# Patient Record
Sex: Male | Born: 1940 | Race: Black or African American | Hispanic: No | Marital: Married | Smoking: Current some day smoker
Health system: Southern US, Community
[De-identification: ages and names within clinical notes are randomized; demographics above are authoritative.]

## PROBLEM LIST (undated history)

## (undated) DIAGNOSIS — I10 Essential (primary) hypertension: Secondary | ICD-10-CM

## (undated) DIAGNOSIS — C801 Malignant (primary) neoplasm, unspecified: Secondary | ICD-10-CM

## (undated) DIAGNOSIS — J45909 Unspecified asthma, uncomplicated: Secondary | ICD-10-CM

## (undated) DIAGNOSIS — C7A019 Malignant carcinoid tumor of the small intestine, unspecified portion: Secondary | ICD-10-CM

## (undated) DIAGNOSIS — K90829 Short bowel syndrome, unspecified: Secondary | ICD-10-CM

## (undated) DIAGNOSIS — N183 Chronic kidney disease, stage 3 unspecified: Secondary | ICD-10-CM

## (undated) DIAGNOSIS — K912 Postsurgical malabsorption, not elsewhere classified: Secondary | ICD-10-CM

## (undated) DIAGNOSIS — K529 Noninfective gastroenteritis and colitis, unspecified: Secondary | ICD-10-CM

## (undated) HISTORY — PX: COLON SURGERY: SHX602

---

## 1997-12-04 ENCOUNTER — Ambulatory Visit (HOSPITAL_COMMUNITY): Admission: RE | Admit: 1997-12-04 | Discharge: 1997-12-04 | Payer: Self-pay | Admitting: Pulmonary Disease

## 1999-03-11 ENCOUNTER — Encounter (INDEPENDENT_AMBULATORY_CARE_PROVIDER_SITE_OTHER): Payer: Self-pay | Admitting: Specialist

## 1999-03-11 ENCOUNTER — Other Ambulatory Visit: Admission: RE | Admit: 1999-03-11 | Discharge: 1999-03-11 | Payer: Self-pay | Admitting: Ophthalmology

## 2000-10-14 ENCOUNTER — Ambulatory Visit (HOSPITAL_COMMUNITY): Admission: RE | Admit: 2000-10-14 | Discharge: 2000-10-14 | Payer: Self-pay | Admitting: Pulmonary Disease

## 2000-10-14 ENCOUNTER — Encounter: Payer: Self-pay | Admitting: Pulmonary Disease

## 2001-01-08 ENCOUNTER — Encounter: Payer: Self-pay | Admitting: Emergency Medicine

## 2001-01-08 ENCOUNTER — Emergency Department (HOSPITAL_COMMUNITY): Admission: EM | Admit: 2001-01-08 | Discharge: 2001-01-08 | Payer: Self-pay | Admitting: Emergency Medicine

## 2003-07-17 ENCOUNTER — Ambulatory Visit (HOSPITAL_COMMUNITY): Admission: RE | Admit: 2003-07-17 | Discharge: 2003-07-17 | Payer: Self-pay | Admitting: Pulmonary Disease

## 2007-02-16 ENCOUNTER — Ambulatory Visit (HOSPITAL_COMMUNITY): Admission: RE | Admit: 2007-02-16 | Discharge: 2007-02-16 | Payer: Self-pay | Admitting: Pulmonary Disease

## 2007-08-21 ENCOUNTER — Emergency Department (HOSPITAL_COMMUNITY): Admission: EM | Admit: 2007-08-21 | Discharge: 2007-08-21 | Payer: Self-pay | Admitting: Emergency Medicine

## 2010-01-24 ENCOUNTER — Ambulatory Visit (HOSPITAL_COMMUNITY): Admission: RE | Admit: 2010-01-24 | Discharge: 2010-01-24 | Payer: Self-pay | Admitting: Pulmonary Disease

## 2010-02-13 ENCOUNTER — Ambulatory Visit (HOSPITAL_COMMUNITY): Admission: RE | Admit: 2010-02-13 | Discharge: 2010-02-13 | Payer: Self-pay | Admitting: General Surgery

## 2010-03-31 ENCOUNTER — Inpatient Hospital Stay (HOSPITAL_COMMUNITY): Admission: RE | Admit: 2010-03-31 | Discharge: 2010-04-04 | Payer: Self-pay | Admitting: General Surgery

## 2010-03-31 ENCOUNTER — Encounter (INDEPENDENT_AMBULATORY_CARE_PROVIDER_SITE_OTHER): Payer: Self-pay | Admitting: General Surgery

## 2010-04-04 ENCOUNTER — Ambulatory Visit: Payer: Self-pay | Admitting: Hematology and Oncology

## 2010-04-10 LAB — CBC WITH DIFFERENTIAL/PLATELET
BASO%: 0.4 % (ref 0.0–2.0)
EOS%: 0.8 % (ref 0.0–7.0)
Eosinophils Absolute: 0.1 10*3/uL (ref 0.0–0.5)
MCHC: 34.8 g/dL (ref 32.0–36.0)
MCV: 91.9 fL (ref 79.3–98.0)
MONO%: 8.8 % (ref 0.0–14.0)
NEUT#: 8 10*3/uL — ABNORMAL HIGH (ref 1.5–6.5)
RBC: 3.57 10*6/uL — ABNORMAL LOW (ref 4.20–5.82)
RDW: 13.2 % (ref 11.0–14.6)

## 2010-04-10 LAB — COMPREHENSIVE METABOLIC PANEL
ALT: 30 U/L (ref 0–53)
AST: 20 U/L (ref 0–37)
Albumin: 3.6 g/dL (ref 3.5–5.2)
Alkaline Phosphatase: 47 U/L (ref 39–117)
Glucose, Bld: 97 mg/dL (ref 70–99)
Potassium: 4 mEq/L (ref 3.5–5.3)
Sodium: 143 mEq/L (ref 135–145)
Total Protein: 6 g/dL (ref 6.0–8.3)

## 2010-04-17 ENCOUNTER — Ambulatory Visit (HOSPITAL_COMMUNITY): Admission: RE | Admit: 2010-04-17 | Discharge: 2010-04-17 | Payer: Self-pay | Admitting: Hematology and Oncology

## 2010-04-23 ENCOUNTER — Encounter (HOSPITAL_COMMUNITY)
Admission: RE | Admit: 2010-04-23 | Discharge: 2010-06-27 | Payer: Self-pay | Source: Home / Self Care | Attending: Hematology and Oncology | Admitting: Hematology and Oncology

## 2010-05-06 ENCOUNTER — Encounter: Admission: RE | Admit: 2010-05-06 | Discharge: 2010-05-06 | Payer: Self-pay | Admitting: Hematology and Oncology

## 2010-05-14 ENCOUNTER — Ambulatory Visit: Payer: Self-pay | Admitting: Hematology and Oncology

## 2010-06-27 ENCOUNTER — Ambulatory Visit: Payer: Self-pay | Admitting: Hematology and Oncology

## 2010-07-04 ENCOUNTER — Ambulatory Visit (HOSPITAL_COMMUNITY)
Admission: RE | Admit: 2010-07-04 | Discharge: 2010-07-04 | Payer: Self-pay | Source: Home / Self Care | Attending: Hematology and Oncology | Admitting: Hematology and Oncology

## 2010-07-04 LAB — COMPREHENSIVE METABOLIC PANEL
ALT: 19 U/L (ref 0–53)
AST: 18 U/L (ref 0–37)
Albumin: 3.5 g/dL (ref 3.5–5.2)
Alkaline Phosphatase: 50 U/L (ref 39–117)
BUN: 15 mg/dL (ref 6–23)
CO2: 29 mEq/L (ref 19–32)
Calcium: 9.2 mg/dL (ref 8.4–10.5)
Chloride: 102 mEq/L (ref 96–112)
Creatinine, Ser: 1.19 mg/dL (ref 0.40–1.50)
Glucose, Bld: 115 mg/dL — ABNORMAL HIGH (ref 70–99)
Potassium: 2.8 mEq/L — ABNORMAL LOW (ref 3.5–5.3)
Sodium: 140 mEq/L (ref 135–145)
Total Bilirubin: 1.2 mg/dL (ref 0.3–1.2)
Total Protein: 6.7 g/dL (ref 6.0–8.3)

## 2010-07-04 LAB — CBC WITH DIFFERENTIAL/PLATELET
BASO%: 0.1 % (ref 0.0–2.0)
Basophils Absolute: 0 10*3/uL (ref 0.0–0.1)
EOS%: 9.4 % — ABNORMAL HIGH (ref 0.0–7.0)
Eosinophils Absolute: 1.1 10*3/uL — ABNORMAL HIGH (ref 0.0–0.5)
HCT: 38 % — ABNORMAL LOW (ref 38.4–49.9)
HGB: 13.1 g/dL (ref 13.0–17.1)
LYMPH%: 20.7 % (ref 14.0–49.0)
MCH: 31.2 pg (ref 27.2–33.4)
MCHC: 34.4 g/dL (ref 32.0–36.0)
MCV: 90.8 fL (ref 79.3–98.0)
MONO#: 0.7 10*3/uL (ref 0.1–0.9)
MONO%: 5.9 % (ref 0.0–14.0)
NEUT#: 7.6 10*3/uL — ABNORMAL HIGH (ref 1.5–6.5)
NEUT%: 63.9 % (ref 39.0–75.0)
Platelets: 202 10*3/uL (ref 140–400)
RBC: 4.19 10*6/uL — ABNORMAL LOW (ref 4.20–5.82)
RDW: 13.9 % (ref 11.0–14.6)
WBC: 11.8 10*3/uL — ABNORMAL HIGH (ref 4.0–10.3)
lymph#: 2.4 10*3/uL (ref 0.9–3.3)

## 2010-07-04 LAB — LACTATE DEHYDROGENASE: LDH: 124 U/L (ref 94–250)

## 2010-07-08 LAB — 5 HIAA, QUANTITATIVE, URINE, 24 HOUR
5-HIAA, 24 Hr Urine: 1.7 mg/24 h (ref <=6.0)
Volume, Urine-5HIAA: 500 mL/24 h

## 2010-07-11 LAB — BASIC METABOLIC PANEL
BUN: 16 mg/dL (ref 6–23)
CO2: 28 mEq/L (ref 19–32)
Calcium: 9 mg/dL (ref 8.4–10.5)
Chloride: 106 mEq/L (ref 96–112)
Creatinine, Ser: 1.21 mg/dL (ref 0.40–1.50)
Glucose, Bld: 120 mg/dL — ABNORMAL HIGH (ref 70–99)
Potassium: 3.4 mEq/L — ABNORMAL LOW (ref 3.5–5.3)
Sodium: 142 mEq/L (ref 135–145)

## 2010-07-16 ENCOUNTER — Encounter
Admission: RE | Admit: 2010-07-16 | Discharge: 2010-07-16 | Payer: Self-pay | Source: Home / Self Care | Attending: Diagnostic Radiology | Admitting: Diagnostic Radiology

## 2010-08-08 ENCOUNTER — Encounter (HOSPITAL_BASED_OUTPATIENT_CLINIC_OR_DEPARTMENT_OTHER): Payer: Medicare Other | Admitting: Hematology and Oncology

## 2010-08-08 DIAGNOSIS — C787 Secondary malignant neoplasm of liver and intrahepatic bile duct: Secondary | ICD-10-CM

## 2010-08-08 DIAGNOSIS — C7A019 Malignant carcinoid tumor of the small intestine, unspecified portion: Secondary | ICD-10-CM

## 2010-08-25 ENCOUNTER — Ambulatory Visit (INDEPENDENT_AMBULATORY_CARE_PROVIDER_SITE_OTHER): Payer: Medicare Other | Admitting: Psychiatry

## 2010-08-25 DIAGNOSIS — F0630 Mood disorder due to known physiological condition, unspecified: Secondary | ICD-10-CM

## 2010-09-05 ENCOUNTER — Encounter (HOSPITAL_BASED_OUTPATIENT_CLINIC_OR_DEPARTMENT_OTHER): Payer: Medicare Other | Admitting: Hematology and Oncology

## 2010-09-05 DIAGNOSIS — C7A1 Malignant poorly differentiated neuroendocrine tumors: Secondary | ICD-10-CM

## 2010-09-05 DIAGNOSIS — C787 Secondary malignant neoplasm of liver and intrahepatic bile duct: Secondary | ICD-10-CM

## 2010-09-11 LAB — CBC
HCT: 31.8 % — ABNORMAL LOW (ref 39.0–52.0)
HCT: 38.6 % — ABNORMAL LOW (ref 39.0–52.0)
Hemoglobin: 11.5 g/dL — ABNORMAL LOW (ref 13.0–17.0)
Hemoglobin: 13.6 g/dL (ref 13.0–17.0)
Hemoglobin: 15.3 g/dL (ref 13.0–17.0)
MCH: 31 pg (ref 26.0–34.0)
MCH: 31.2 pg (ref 26.0–34.0)
MCH: 32.1 pg (ref 26.0–34.0)
MCHC: 35.2 g/dL (ref 30.0–36.0)
MCHC: 36.2 g/dL — ABNORMAL HIGH (ref 30.0–36.0)
MCHC: 36.4 g/dL — ABNORMAL HIGH (ref 30.0–36.0)
Platelets: 211 10*3/uL (ref 150–400)
RBC: 4.39 MIL/uL (ref 4.22–5.81)
RDW: 12.5 % (ref 11.5–15.5)
RDW: 12.6 % (ref 11.5–15.5)

## 2010-09-11 LAB — BASIC METABOLIC PANEL
BUN: 7 mg/dL (ref 6–23)
CO2: 30 mEq/L (ref 19–32)
CO2: 34 mEq/L — ABNORMAL HIGH (ref 19–32)
Calcium: 8.8 mg/dL (ref 8.4–10.5)
Chloride: 100 mEq/L (ref 96–112)
GFR calc Af Amer: >60 mL/min (ref >60)
GFR calc non Af Amer: >60 mL/min (ref >60)
Glucose, Bld: 110 mg/dL — ABNORMAL HIGH (ref 70–99)
Glucose, Bld: 142 mg/dL — ABNORMAL HIGH (ref 70–99)
Potassium: 3.3 mEq/L — ABNORMAL LOW (ref 3.5–5.1)
Potassium: 3.8 mEq/L (ref 3.5–5.1)
Sodium: 137 mEq/L (ref 135–145)
Sodium: 138 mEq/L (ref 135–145)

## 2010-09-11 LAB — DIFFERENTIAL
Basophils Absolute: 0 10*3/uL (ref 0.0–0.1)
Basophils Absolute: 0 10*3/uL (ref 0.0–0.1)
Basophils Relative: 0 % (ref 0–1)
Basophils Relative: 0 % (ref 0–1)
Eosinophils Absolute: 0 10*3/uL (ref 0.0–0.7)
Eosinophils Absolute: 0.1 10*3/uL (ref 0.0–0.7)
Eosinophils Relative: 1 % (ref 0–5)
Eosinophils Relative: 2 % (ref 0–5)
Lymphocytes Relative: 27 % (ref 12–46)
Lymphs Abs: 1.7 10*3/uL (ref 0.7–4.0)
Lymphs Abs: 2.9 10*3/uL (ref 0.7–4.0)
Monocytes Absolute: 1.6 10*3/uL — ABNORMAL HIGH (ref 0.1–1.0)
Monocytes Absolute: 2.4 10*3/uL — ABNORMAL HIGH (ref 0.1–1.0)
Monocytes Absolute: 0.9 10*3/uL (ref 0.1–1.0)
Monocytes Relative: 12 % (ref 3–12)
Monocytes Relative: 13 % — ABNORMAL HIGH (ref 3–12)
Neutro Abs: 8.9 10*3/uL — ABNORMAL HIGH (ref 1.7–7.7)
Neutro Abs: 6.8 10*3/uL (ref 1.7–7.7)

## 2010-09-11 LAB — COMPREHENSIVE METABOLIC PANEL
AST: 23 U/L (ref 0–37)
Albumin: 3.7 g/dL (ref 3.5–5.2)
BUN: 15 mg/dL (ref 6–23)
Calcium: 10 mg/dL (ref 8.4–10.5)
Chloride: 106 mEq/L (ref 96–112)
Creatinine, Ser: 1.3 mg/dL (ref 0.4–1.5)
GFR calc Af Amer: >60 mL/min (ref >60)
Total Bilirubin: 1 mg/dL (ref 0.3–1.2)
Total Protein: 6.8 g/dL (ref 6.0–8.3)

## 2010-09-11 LAB — TYPE AND SCREEN

## 2010-09-11 LAB — CEA: CEA: 0.8 ng/mL (ref 0.0–5.0)

## 2010-09-11 LAB — ABO/RH: ABO/RH(D): A POS

## 2010-09-23 ENCOUNTER — Other Ambulatory Visit: Payer: Self-pay | Admitting: Gastroenterology

## 2010-10-03 ENCOUNTER — Other Ambulatory Visit: Payer: Self-pay | Admitting: Hematology and Oncology

## 2010-10-03 ENCOUNTER — Encounter (HOSPITAL_BASED_OUTPATIENT_CLINIC_OR_DEPARTMENT_OTHER): Payer: Medicare Other | Admitting: Hematology and Oncology

## 2010-10-03 DIAGNOSIS — C787 Secondary malignant neoplasm of liver and intrahepatic bile duct: Secondary | ICD-10-CM

## 2010-10-03 DIAGNOSIS — R197 Diarrhea, unspecified: Secondary | ICD-10-CM

## 2010-10-03 DIAGNOSIS — R232 Flushing: Secondary | ICD-10-CM

## 2010-10-03 DIAGNOSIS — C7A1 Malignant poorly differentiated neuroendocrine tumors: Secondary | ICD-10-CM

## 2010-10-03 LAB — CBC WITH DIFFERENTIAL/PLATELET
Basophils Absolute: 0 10*3/uL (ref 0.0–0.1)
EOS%: 2.6 % (ref 0.0–7.0)
Eosinophils Absolute: 0.2 10*3/uL (ref 0.0–0.5)
HGB: 12.2 g/dL — ABNORMAL LOW (ref 13.0–17.1)
MONO#: 0.7 10*3/uL (ref 0.1–0.9)
NEUT#: 6.1 10*3/uL (ref 1.5–6.5)
RDW: 13.3 % (ref 11.0–14.6)
lymph#: 2.2 10*3/uL (ref 0.9–3.3)

## 2010-10-06 LAB — 5 HIAA, QUANTITATIVE, URINE, 24 HOUR: Volume, Urine-5HIAA: 1100 mL/24 h

## 2010-10-08 LAB — COMPREHENSIVE METABOLIC PANEL
ALT: 14 U/L (ref 0–53)
CO2: 25 mEq/L (ref 19–32)
Chloride: 107 mEq/L (ref 96–112)
Sodium: 144 mEq/L (ref 135–145)
Total Bilirubin: 1.1 mg/dL (ref 0.3–1.2)
Total Protein: 6.6 g/dL (ref 6.0–8.3)

## 2010-10-08 LAB — LACTATE DEHYDROGENASE: LDH: 146 U/L (ref 94–250)

## 2010-10-15 ENCOUNTER — Other Ambulatory Visit: Payer: Self-pay | Admitting: Diagnostic Radiology

## 2010-10-15 ENCOUNTER — Other Ambulatory Visit: Payer: Self-pay | Admitting: Hematology and Oncology

## 2010-10-15 ENCOUNTER — Encounter (HOSPITAL_BASED_OUTPATIENT_CLINIC_OR_DEPARTMENT_OTHER): Payer: Medicare Other | Admitting: Hematology and Oncology

## 2010-10-15 DIAGNOSIS — C7A8 Other malignant neuroendocrine tumors: Secondary | ICD-10-CM

## 2010-10-15 DIAGNOSIS — D3A8 Other benign neuroendocrine tumors: Secondary | ICD-10-CM

## 2010-10-15 DIAGNOSIS — C787 Secondary malignant neoplasm of liver and intrahepatic bile duct: Secondary | ICD-10-CM

## 2010-10-15 DIAGNOSIS — C7A1 Malignant poorly differentiated neuroendocrine tumors: Secondary | ICD-10-CM

## 2010-10-15 LAB — BASIC METABOLIC PANEL
BUN: 13 mg/dL (ref 6–23)
Glucose, Bld: 146 mg/dL — ABNORMAL HIGH (ref 70–99)
Potassium: 3.3 mEq/L — ABNORMAL LOW (ref 3.5–5.3)

## 2010-10-31 ENCOUNTER — Encounter (HOSPITAL_BASED_OUTPATIENT_CLINIC_OR_DEPARTMENT_OTHER): Payer: Medicare Other | Admitting: Hematology and Oncology

## 2010-10-31 DIAGNOSIS — C7A1 Malignant poorly differentiated neuroendocrine tumors: Secondary | ICD-10-CM

## 2010-10-31 DIAGNOSIS — C787 Secondary malignant neoplasm of liver and intrahepatic bile duct: Secondary | ICD-10-CM

## 2010-11-28 ENCOUNTER — Encounter (HOSPITAL_BASED_OUTPATIENT_CLINIC_OR_DEPARTMENT_OTHER): Payer: Medicare Other | Admitting: Hematology and Oncology

## 2010-11-28 DIAGNOSIS — C787 Secondary malignant neoplasm of liver and intrahepatic bile duct: Secondary | ICD-10-CM

## 2010-11-28 DIAGNOSIS — C7A1 Malignant poorly differentiated neuroendocrine tumors: Secondary | ICD-10-CM

## 2010-12-26 ENCOUNTER — Encounter (HOSPITAL_BASED_OUTPATIENT_CLINIC_OR_DEPARTMENT_OTHER): Payer: Medicare Other | Admitting: Hematology and Oncology

## 2010-12-26 DIAGNOSIS — C7A1 Malignant poorly differentiated neuroendocrine tumors: Secondary | ICD-10-CM

## 2010-12-26 DIAGNOSIS — C787 Secondary malignant neoplasm of liver and intrahepatic bile duct: Secondary | ICD-10-CM

## 2011-01-06 ENCOUNTER — Other Ambulatory Visit: Payer: Self-pay | Admitting: Diagnostic Radiology

## 2011-01-06 DIAGNOSIS — K769 Liver disease, unspecified: Secondary | ICD-10-CM

## 2011-01-16 ENCOUNTER — Ambulatory Visit (HOSPITAL_COMMUNITY)
Admission: RE | Admit: 2011-01-16 | Discharge: 2011-01-16 | Disposition: A | Discharge status: Home / Self Care | Payer: Medicare Other | Source: Ambulatory Visit | Attending: Hematology and Oncology | Admitting: Hematology and Oncology

## 2011-01-16 DIAGNOSIS — D3A8 Other benign neuroendocrine tumors: Secondary | ICD-10-CM

## 2011-01-16 DIAGNOSIS — I10 Essential (primary) hypertension: Secondary | ICD-10-CM | POA: Insufficient documentation

## 2011-01-16 DIAGNOSIS — K7689 Other specified diseases of liver: Secondary | ICD-10-CM | POA: Insufficient documentation

## 2011-01-16 DIAGNOSIS — N281 Cyst of kidney, acquired: Secondary | ICD-10-CM | POA: Insufficient documentation

## 2011-01-16 DIAGNOSIS — C7A00 Malignant carcinoid tumor of unspecified site: Secondary | ICD-10-CM | POA: Insufficient documentation

## 2011-01-16 LAB — CREATININE, SERUM: Creatinine, Ser: 1.04 mg/dL (ref 0.50–1.35)

## 2011-01-16 MED ORDER — GADOBENATE DIMEGLUMINE 529 MG/ML IV SOLN
17.0000 mL | Freq: Once | INTRAVENOUS | Status: AC | PRN
  Administered 2011-01-16: 17 mL via INTRAVENOUS

## 2011-01-21 ENCOUNTER — Ambulatory Visit
Admission: RE | Admit: 2011-01-21 | Discharge: 2011-01-21 | Disposition: A | Discharge status: Home / Self Care | Payer: Medicare Other | Source: Ambulatory Visit | Attending: Diagnostic Radiology | Admitting: Diagnostic Radiology

## 2011-01-21 DIAGNOSIS — C7A8 Other malignant neuroendocrine tumors: Secondary | ICD-10-CM

## 2011-01-21 DIAGNOSIS — C787 Secondary malignant neoplasm of liver and intrahepatic bile duct: Secondary | ICD-10-CM

## 2011-01-21 NOTE — Progress Notes (Signed)
Pt states that he is seeing Dr. Turner Daniels, oncologist at [facility].  Denies nausea or vomiting.  Does experience freq bouts of diarrhea.  Rx per Dr Kendall Flack:  Codeine 15 mg 2 tabs 3-4 x's/day.  Denies pain.  Appetite fair.  Some occasional bloating.    Endurance:  Good.

## 2011-01-23 ENCOUNTER — Other Ambulatory Visit: Payer: Self-pay | Admitting: Hematology and Oncology

## 2011-01-23 ENCOUNTER — Encounter (HOSPITAL_BASED_OUTPATIENT_CLINIC_OR_DEPARTMENT_OTHER): Payer: Medicare Other | Admitting: Hematology and Oncology

## 2011-01-23 DIAGNOSIS — C787 Secondary malignant neoplasm of liver and intrahepatic bile duct: Secondary | ICD-10-CM

## 2011-01-23 DIAGNOSIS — C7A1 Malignant poorly differentiated neuroendocrine tumors: Secondary | ICD-10-CM

## 2011-01-23 LAB — CBC WITH DIFFERENTIAL/PLATELET
BASO%: 0.4 % (ref 0.0–2.0)
Basophils Absolute: 0 10e3/uL (ref 0.0–0.1)
EOS%: 3.8 % (ref 0.0–7.0)
Eosinophils Absolute: 0.3 10e3/uL (ref 0.0–0.5)
HCT: 35.8 % — ABNORMAL LOW (ref 38.4–49.9)
HGB: 12.5 g/dL — ABNORMAL LOW (ref 13.0–17.1)
LYMPH%: 38.5 % (ref 14.0–49.0)
MCH: 32.6 pg (ref 27.2–33.4)
MCHC: 35 g/dL (ref 32.0–36.0)
MCV: 93.2 fL (ref 79.3–98.0)
MONO#: 0.6 10e3/uL (ref 0.1–0.9)
MONO%: 8.7 % (ref 0.0–14.0)
NEUT#: 3.6 10e3/uL (ref 1.5–6.5)
NEUT%: 48.6 % (ref 39.0–75.0)
Platelets: 162 10e3/uL (ref 140–400)
RBC: 3.84 10e6/uL — ABNORMAL LOW (ref 4.20–5.82)
RDW: 13.2 % (ref 11.0–14.6)
WBC: 7.5 10e3/uL (ref 4.0–10.3)
lymph#: 2.9 10e3/uL (ref 0.9–3.3)

## 2011-01-28 ENCOUNTER — Other Ambulatory Visit: Payer: Self-pay | Admitting: Hematology and Oncology

## 2011-01-28 ENCOUNTER — Encounter (HOSPITAL_BASED_OUTPATIENT_CLINIC_OR_DEPARTMENT_OTHER): Payer: Medicare Other | Admitting: Hematology and Oncology

## 2011-01-28 DIAGNOSIS — C7A1 Malignant poorly differentiated neuroendocrine tumors: Secondary | ICD-10-CM

## 2011-01-28 DIAGNOSIS — C787 Secondary malignant neoplasm of liver and intrahepatic bile duct: Secondary | ICD-10-CM

## 2011-01-29 LAB — COMPREHENSIVE METABOLIC PANEL
Alkaline Phosphatase: 48 U/L (ref 39–117)
BUN: 22 mg/dL (ref 6–23)
Creatinine, Ser: 1.28 mg/dL (ref 0.50–1.35)
Glucose, Bld: 109 mg/dL — ABNORMAL HIGH (ref 70–99)
Total Bilirubin: 0.9 mg/dL (ref 0.3–1.2)

## 2011-01-29 LAB — CHROMOGRANIN A: Chromogranin A: 7.6 ng/mL (ref 1.9–15.0)

## 2011-02-04 LAB — 5 HIAA, QUANTITATIVE, URINE, 24 HOUR: 5-HIAA, 24 Hr Urine: 4.1 mg/24 h (ref <=6.0)

## 2011-03-20 LAB — CBC
Platelets: 241
RBC: 4.93
WBC: 13.1 — ABNORMAL HIGH

## 2011-03-20 LAB — URINALYSIS, ROUTINE W REFLEX MICROSCOPIC
Bilirubin Urine: NEGATIVE
Glucose, UA: NEGATIVE
Ketones, ur: NEGATIVE
Nitrite: NEGATIVE
Specific Gravity, Urine: 1.023
pH: 6.5

## 2011-03-20 LAB — COMPREHENSIVE METABOLIC PANEL
ALT: 30
AST: 22
Albumin: 3.7
CO2: 31
Calcium: 9.4
Chloride: 105
GFR calc Af Amer: >60
GFR calc non Af Amer: 56 — ABNORMAL LOW
Sodium: 142

## 2011-03-20 LAB — DIFFERENTIAL
Eosinophils Absolute: 0
Eosinophils Relative: 0
Lymphs Abs: 1.4
Monocytes Absolute: 0.2

## 2011-03-20 LAB — LIPASE, BLOOD: Lipase: 21

## 2011-03-20 LAB — URINE MICROSCOPIC-ADD ON

## 2011-07-08 ENCOUNTER — Ambulatory Visit: Payer: Medicare Other | Admitting: Hematology and Oncology

## 2011-07-08 ENCOUNTER — Other Ambulatory Visit: Payer: Medicare Other | Admitting: Lab

## 2012-08-23 ENCOUNTER — Emergency Department (HOSPITAL_COMMUNITY)
Admission: EM | Admit: 2012-08-23 | Discharge: 2012-08-24 | Disposition: A | Discharge status: Home / Self Care | Payer: Medicare Other | Attending: Emergency Medicine | Admitting: Emergency Medicine

## 2012-08-23 ENCOUNTER — Emergency Department (HOSPITAL_COMMUNITY)
Admission: EM | Admit: 2012-08-23 | Discharge: 2012-08-23 | Disposition: A | Discharge status: Nursing Home (Includes ICF) | Payer: Medicare Other | Source: Home / Self Care | Attending: Family Medicine | Admitting: Family Medicine

## 2012-08-23 ENCOUNTER — Encounter (HOSPITAL_COMMUNITY): Payer: Self-pay | Admitting: *Deleted

## 2012-08-23 ENCOUNTER — Emergency Department (INDEPENDENT_AMBULATORY_CARE_PROVIDER_SITE_OTHER): Payer: Medicare Other

## 2012-08-23 ENCOUNTER — Emergency Department (HOSPITAL_COMMUNITY): Payer: Medicare Other

## 2012-08-23 ENCOUNTER — Encounter (HOSPITAL_COMMUNITY): Payer: Self-pay | Admitting: Emergency Medicine

## 2012-08-23 DIAGNOSIS — R10A1 Flank pain, right side: Secondary | ICD-10-CM

## 2012-08-23 DIAGNOSIS — R109 Unspecified abdominal pain: Secondary | ICD-10-CM

## 2012-08-23 DIAGNOSIS — F17200 Nicotine dependence, unspecified, uncomplicated: Secondary | ICD-10-CM | POA: Insufficient documentation

## 2012-08-23 DIAGNOSIS — N200 Calculus of kidney: Secondary | ICD-10-CM

## 2012-08-23 DIAGNOSIS — R52 Pain, unspecified: Secondary | ICD-10-CM

## 2012-08-23 DIAGNOSIS — N179 Acute kidney failure, unspecified: Secondary | ICD-10-CM

## 2012-08-23 DIAGNOSIS — Z7982 Long term (current) use of aspirin: Secondary | ICD-10-CM | POA: Insufficient documentation

## 2012-08-23 DIAGNOSIS — N289 Disorder of kidney and ureter, unspecified: Secondary | ICD-10-CM

## 2012-08-23 DIAGNOSIS — Z79899 Other long term (current) drug therapy: Secondary | ICD-10-CM | POA: Insufficient documentation

## 2012-08-23 DIAGNOSIS — Z85038 Personal history of other malignant neoplasm of large intestine: Secondary | ICD-10-CM | POA: Insufficient documentation

## 2012-08-23 HISTORY — DX: Malignant (primary) neoplasm, unspecified: C80.1

## 2012-08-23 LAB — CBC WITH DIFFERENTIAL/PLATELET
Basophils Absolute: 0 10*3/uL (ref 0.0–0.1)
Eosinophils Absolute: 0 10*3/uL (ref 0.0–0.7)
Eosinophils Relative: 0 % (ref 0–5)
Lymphocytes Relative: 17 % (ref 12–46)
Lymphs Abs: 2.5 10*3/uL (ref 0.7–4.0)
MCV: 87.4 fL (ref 78.0–100.0)
Neutrophils Relative %: 74 % (ref 43–77)
Platelets: 180 10*3/uL (ref 150–400)
RBC: 4.76 MIL/uL (ref 4.22–5.81)
RDW: 12.4 % (ref 11.5–15.5)
WBC: 14.3 10*3/uL — ABNORMAL HIGH (ref 4.0–10.5)

## 2012-08-23 LAB — HEPATIC FUNCTION PANEL
AST: 19 U/L (ref 0–37)
Bilirubin, Direct: 0.2 mg/dL (ref 0.0–0.3)
Indirect Bilirubin: 0.6 mg/dL (ref 0.3–0.9)
Total Bilirubin: 0.8 mg/dL (ref 0.3–1.2)

## 2012-08-23 LAB — PROTIME-INR
INR: 1 (ref 0.00–1.49)
Prothrombin Time: 13.1 seconds (ref 11.6–15.2)

## 2012-08-23 LAB — URINALYSIS, ROUTINE W REFLEX MICROSCOPIC
Nitrite: NEGATIVE
Protein, ur: 100 mg/dL — AB
Specific Gravity, Urine: 1.018 (ref 1.005–1.030)
Urobilinogen, UA: 0.2 mg/dL (ref 0.0–1.0)

## 2012-08-23 LAB — POCT I-STAT, CHEM 8
Calcium, Ion: 1.3 mmol/L (ref 1.13–1.30)
Creatinine, Ser: 2 mg/dL — ABNORMAL HIGH (ref 0.50–1.35)
Glucose, Bld: 108 mg/dL — ABNORMAL HIGH (ref 70–99)
HCT: 49 % (ref 39.0–52.0)
Hemoglobin: 16.7 g/dL (ref 13.0–17.0)
Potassium: 3.7 mEq/L (ref 3.5–5.1)
TCO2: 29 mmol/L (ref 0–100)

## 2012-08-23 LAB — URINE MICROSCOPIC-ADD ON

## 2012-08-23 MED ORDER — ONDANSETRON 4 MG PO TBDP
ORAL_TABLET | ORAL | Status: AC
  Filled 2012-08-23: qty 1

## 2012-08-23 MED ORDER — ONDANSETRON HCL 4 MG/2ML IJ SOLN
4.0000 mg | Freq: Once | INTRAMUSCULAR | Status: AC
  Administered 2012-08-23: 4 mg via INTRAVENOUS
  Filled 2012-08-23: qty 2

## 2012-08-23 MED ORDER — ONDANSETRON 4 MG PO TBDP
4.0000 mg | ORAL_TABLET | Freq: Once | ORAL | Status: AC
  Administered 2012-08-23: 4 mg via ORAL

## 2012-08-23 MED ORDER — IOHEXOL 300 MG/ML  SOLN
25.0000 mL | INTRAMUSCULAR | Status: AC
  Administered 2012-08-23: 25 mL via ORAL

## 2012-08-23 MED ORDER — FUROSEMIDE 10 MG/ML IJ SOLN: 100.0000 mg | Freq: Once | INTRAVENOUS | Status: DC

## 2012-08-23 MED ORDER — SODIUM CHLORIDE 0.9 % IV SOLN
INTRAVENOUS | Status: DC
  Administered 2012-08-23: 125 mL/h via INTRAVENOUS

## 2012-08-23 MED ORDER — OXYCODONE-ACETAMINOPHEN 7.5-325 MG PO TABS: 1.0000 | ORAL_TABLET | ORAL | Status: DC | PRN

## 2012-08-23 MED ORDER — HYDROCODONE-ACETAMINOPHEN 5-325 MG PO TABS: 2.0000 | ORAL_TABLET | Freq: Three times a day (TID) | ORAL | Status: DC | PRN

## 2012-08-23 MED ORDER — FENTANYL CITRATE 0.05 MG/ML IJ SOLN
50.0000 ug | Freq: Once | INTRAMUSCULAR | Status: AC
  Administered 2012-08-23: 50 ug via INTRAVENOUS
  Filled 2012-08-23: qty 2

## 2012-08-23 MED ORDER — HYDROCODONE-ACETAMINOPHEN 5-325 MG PO TABS
1.0000 | ORAL_TABLET | Freq: Once | ORAL | Status: AC
  Administered 2012-08-23: 1 via ORAL

## 2012-08-23 MED ORDER — HYDROCODONE-ACETAMINOPHEN 5-325 MG PO TABS
ORAL_TABLET | ORAL | Status: AC
  Filled 2012-08-23: qty 1

## 2012-08-23 MED ORDER — FENTANYL CITRATE 0.05 MG/ML IJ SOLN
50.0000 ug | Freq: Once | INTRAMUSCULAR | Status: AC
  Administered 2012-08-24: 50 ug via INTRAVENOUS
  Filled 2012-08-23: qty 2

## 2012-08-23 MED ORDER — ONDANSETRON 4 MG PO TBDP: 4.0000 mg | ORAL_TABLET | Freq: Three times a day (TID) | ORAL | Status: DC | PRN

## 2012-08-23 NOTE — ED Provider Notes (Addendum)
History     CSN: [id number]  Arrival date & time 08/23/12  1709   First MD Initiated Contact with Patient 08/23/12 2003      Chief Complaint  Patient presents with  . 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 Here complaining of intermittent right flank pain since last evening. Symptoms associated with nausea but denies vomiting. Denies dizziness or headache. No dysuria or hematuria. No fever or chills.  Patient reports that he's been currently followed by an oncologist in [place] and his cancer has been stable (dormant) for years. Denies weight changes in the last month. He normally has loose stools. He reports his last bowel movement was yesterday and it was as usual. Denies increase of diarrhea. No jaundice. Denies chest pain or shortness of breath.  Past Medical History  Diagnosis Date  . Cancer     Past Surgical History  Procedure Laterality Date  . Colon surgery      History reviewed. No pertinent family history.  History  Substance Use Topics  . Smoking status: Current Every Day Smoker -- 0.30 packs/day    Types: Cigarettes  . Smokeless tobacco: Never Used  . Alcohol Use: Yes      Review of Systems All other systems reviewed and are nega Allergies  Penicillins  Home Medications   Current Outpatient Rx  Name  Route  Sig  Dispense  Refill  . aspirin EC 81 MG tablet   Oral   Take 81 mg by mouth daily.         . hydrochlorothiazide (MICROZIDE) 12.5 MG capsule   Oral   Take 12.5 mg by mouth daily.         . Multiple Vitamin (MULTIVITAMIN WITH MINERALS) TABS   Oral   Take 1 tablet by mouth daily.         . nebivolol (BYSTOLIC) 10 MG tablet   Oral   Take 20 mg by mouth daily.         . ondansetron (ZOFRAN-ODT) 4 MG disintegrating tablet   Oral   Take 1 tablet (4 mg total) by mouth every 8 (eight) hours as needed for nausea.   10 tablet  0   . oxyCODONE-acetaminophen (PERCOCET) 7.5-325 MG per tablet   Oral   Take 1 tablet by mouth every 4 (four) hours as needed for pain.   30 tablet   0     BP 144/72  Pulse 44  Temp(Src) 98.5 F (36.9 C) (Oral)  Resp 16  SpO2 94%  Physical Exam  Nursing note and vitals reviewed. Constitutional: He is oriented to person, place, and time. He appears well-developed and well-nourished. No distress.  HENT:  Head: Normocephalic and atraumatic.  Eyes: Pupils are equal, round, and reactive to light.  Neck: Normal range of motion.  Cardiovascular: Normal rate and intact distal pulses.   Pulmonary/Chest: No respiratory distress.  Abdominal: Normal appearance. He exhibits no distension. There is tenderness (Mild periumbilical). There is no rebound and no guarding.  Musculoskeletal: Normal range of motion. He exhibits no tenderness.  Neurological: He is alert and oriented to person, place, and time. No cranial nerve deficit.  Skin: Skin is warm and dry. No rash noted.  Psychiatric: He has a normal mood and affect. His behavior is normal.    ED Course  Procedures (including critical care time)  Labs Reviewed  URINALYSIS, ROUTINE W REFLEX MICROSCOPIC - Abnormal;  Notable for the following:    Hgb urine dipstick MODERATE (*)    Protein, ur 100 (*)    All other components within normal limits  CBC WITH DIFFERENTIAL - Abnormal; Notable for the following:    WBC 14.3 (*)    MCHC 36.5 (*)    Neutro Abs 10.6 (*)    Monocytes Absolute 1.2 (*)    All other components within normal limits  URINE MICROSCOPIC-ADD ON - Abnormal; Notable for the following:    Casts GRANULAR CAST (*)    All other components within normal limits  HEPATIC FUNCTION PANEL  PROTIME-INR  LACTIC ACID, PLASMA   Ct Abdomen Pelvis Wo Contrast  08/23/2012  *RADIOLOGY REPORT*  Clinical Data: 72 year old Alan [name] with abdominal and pelvic pain. History of neuroendocrine tumor and liver metastases.  CT ABDOMEN AND PELVIS  WITHOUT CONTRAST  Technique:  Multidetector CT imaging of the abdomen and pelvis was performed following the standard protocol without intravenous contrast.  Comparison: 04/17/2010 PET CT and 01/24/2010 CT  Findings: A 2 mm right UVJ calculus causes mild right hydroureteronephrosis. Several left renal lesions and cysts are again noted and not significantly changed. A low density lesion within the anterior right liver (image 33) is not appear significantly changed as well.  The gallbladder, adrenal glands, pancreas and spleen are unremarkable.  Please note that parenchymal abnormalities may be missed as intravenous contrast was not administered.  No free fluid, enlarged lymph nodes, biliary dilation or abdominal aortic aneurysm identified.  Evidence of previous surgical changes within the abdomen noted. There is no evidence of bowel obstruction or pneumoperitoneum. The appendix is normal. Prostate enlargement is present. No acute or suspicious bony abnormalities are identified.  IMPRESSION: 2 mm right UVJ calculus causing mild right hydroureteronephrosis.  Relatively stable indeterminate left renal lesions and cysts.   Original Report Authenticated By: Margarette Canada, M.D.    Dg Abd 1 View  08/23/2012  *RADIOLOGY REPORT*  Clinical Data: Abdominal pain and nausea.  History of small bowel neuroendocrine carcinoma.  ABDOMEN - 1 VIEW  Comparison: CT dated 01/24/2010.  Findings: Visualized bowel gas pattern shows some mild gaseous prominence of the colon which may be reflective of a mild ileus pattern.  No overt small bowel obstruction is identified.  Suture line from previous small bowel resection is identified in the left mid abdomen.  Significant degenerative changes are present in the lumbar spine.  No abnormal calcifications are visualized.  IMPRESSION: Mild gaseous prominence of the colon which may reflect some degree of ileus.  No evidence of overt bowel obstruction.   Original Report Authenticated By: Aletta Edouard, M.D.      1. Kidney stone   2. Renal insufficiency       MDM          Dot Lanes, MD 08/23/12 2349   Dot Lanes, MD 08/24/12 1332

## 2012-08-23 NOTE — ED Notes (Signed)
Pt reports abdomen/right flank pain that started last night. 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 States that cancer med does adversely affect gallbladder

## 2012-08-23 NOTE — ED Notes (Signed)
Patient stated he's experiencing spasmatic abdominal pain.  The pain is accompanied by nausea.  Bowl sounds are present. He is a cancer patient.  He states that he's had 12 feet of small intestines removed.

## 2012-08-23 NOTE — ED Notes (Signed)
Pt c/o generalized abd pain and sent from [facility] for further eval for possible ileus; pt noted to be bradycardic per norm recently but unsure of exact rate and no EKG in a year; pt sts increased BUN and CREA from baseline also; pt sts hx of bowel resection from CA

## 2012-08-23 NOTE — ED Provider Notes (Signed)
History     CSN: [id number]  Arrival date & time 08/23/12  1322   First MD Initiated Contact with Patient 08/23/12 1329      Chief Complaint  Patient presents with  . 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 Here complaining of intermittent right flank pain since last evening. Symptoms associated with nausea but denies vomiting. Denies dizziness or headache. No dysuria or hematuria. No fever or chills. Patient reports that he's been currently followed by an oncologist in [place] and his cancer has been stable (dormant)  for years. Denies weight changes in the last month. He normally has loose stools. He reports his last bowel movement was yesterday and it was as usual. Denies increase of diarrhea. No jaundice. Denies chest pain or shortness of breath.   Past Medical History  Diagnosis Date  . Cancer     Past Surgical History  Procedure Laterality Date  . Colon surgery      Family History  Problem Relation Age of Onset  . Family history unknown: Yes    History  Substance Use Topics  . Smoking status: Current Every Day Smoker -- 0.30 packs/day    Types: Cigarettes  . Smokeless tobacco: Never Used  . Alcohol Use: Yes      Review of Systems  Constitutional: Negative for fever, chills and fatigue.  Respiratory: Negative for shortness of breath.   Cardiovascular: Negative for chest pain.  Gastrointestinal: Positive for nausea, abdominal pain and diarrhea. Negative for vomiting and blood in stool.  Genitourinary: Negative for dysuria and hematuria.  Neurological: Negative for dizziness and headaches.  All other systems reviewed and are negative.    Allergies  Penicillins  Home Medications  No current outpatient prescriptions on file.  BP 192/67  Pulse 43   Temp(Src) 97.9 F (36.6 C) (Oral)  SpO2 96%  Physical Exam  Nursing note and vitals reviewed. Constitutional: He is oriented to person, place, and time. He appears well-developed and well-nourished. No distress.  HENT:  Head: Normocephalic and atraumatic.  Mouth/Throat: Oropharynx is clear and moist. No oropharyngeal exudate.  Eyes: Conjunctivae are normal. No scleral icterus.  Neck: No thyromegaly present.  Cardiovascular: Normal rate, regular rhythm and normal heart sounds.   Pulmonary/Chest: Effort normal and breath sounds normal. No respiratory distress. He has no wheezes. He has no rales. He exhibits no tenderness.  Abdominal: Soft. Bowel sounds are normal. He exhibits no distension and no mass. There is tenderness. There is no rebound.  Abdomen is soft but impress voluntary guarding on the right flank and right lower quadrant. Impress palpable tip of the liver.  Neurological: He is alert and oriented to person, place, and time.  Skin: No rash noted. He is not diaphoretic.    ED Course  Procedures (including critical care time)  Labs Reviewed  POCT I-STAT, CHEM 8 - Abnormal; Notable for the following:    BUN 28 (*)    Creatinine, Ser 2.00 (*)    Glucose, Bld 108 (*)    All other components within normal limits   Dg Abd 1 View  08/23/2012  *RADIOLOGY REPORT*  Clinical Data: Abdominal pain and nausea.  History of small bowel neuroendocrine carcinoma.  ABDOMEN - 1 VIEW  Comparison: CT dated 01/24/2010.  Findings: Visualized bowel gas pattern shows some mild gaseous prominence of the colon which may be reflective of a mild ileus pattern.  No overt small bowel obstruction is identified.  Suture line from previous small bowel resection is identified in the left mid abdomen.  Significant degenerative changes are present in the lumbar spine.  No abnormal calcifications are visualized.  IMPRESSION: Mild gaseous prominence of the colon which may reflect some degree of ileus.  No evidence of  overt bowel obstruction.   Original Report Authenticated By: Aletta Edouard, M.D.      1. Acute abdominal pain in right flank   2. Acute renal failure       MDM  72 year old male with history of a intestinal neuroendocrine cancer status post resection and liver metastasis. Here complaining of right flank pain associated with nausea. Denies emesis. Having loose bowel movements last time yesterday. On exam vital signs stable. Abdomen is soft with diffuse tenderness and voluntary guarding in the right flank. No rebound. Impress liver tip palpable. No jaundice. Point-of-care labs showed creatinine 2.0 which is increased from prior creatinine at 1.2 one year ago. Abdominal x-ray: With findings suggestive of ileus but no obvious bowel obstruction. Symptoms not improved with ondansetron 4 mg ODT x1 and Norco 325/5 mg oral x1. Decided to transfer patient to the emergency department for further evaluation and management.         Randa Spike, MD 08/23/12 1654

## 2016-04-08 ENCOUNTER — Other Ambulatory Visit: Payer: Self-pay | Admitting: Nephrology

## 2016-04-08 DIAGNOSIS — N183 Chronic kidney disease, stage 3 unspecified: Secondary | ICD-10-CM

## 2016-04-16 ENCOUNTER — Ambulatory Visit
Admission: RE | Admit: 2016-04-16 | Discharge: 2016-04-16 | Disposition: A | Discharge status: Home / Self Care | Payer: Medicare Other | Source: Ambulatory Visit | Attending: Nephrology | Admitting: Nephrology

## 2016-04-16 DIAGNOSIS — N183 Chronic kidney disease, stage 3 unspecified: Secondary | ICD-10-CM

## 2020-03-20 ENCOUNTER — Other Ambulatory Visit: Payer: Self-pay

## 2020-03-20 ENCOUNTER — Encounter (INDEPENDENT_AMBULATORY_CARE_PROVIDER_SITE_OTHER): Payer: Self-pay | Admitting: Otolaryngology

## 2020-03-20 ENCOUNTER — Ambulatory Visit (INDEPENDENT_AMBULATORY_CARE_PROVIDER_SITE_OTHER): Payer: Medicare Other | Admitting: Otolaryngology

## 2020-03-20 VITALS — Temp 97.7°F

## 2020-03-20 DIAGNOSIS — H6123 Impacted cerumen, bilateral: Secondary | ICD-10-CM

## 2020-03-20 DIAGNOSIS — H903 Sensorineural hearing loss, bilateral: Secondary | ICD-10-CM

## 2020-03-20 NOTE — Progress Notes (Signed)
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  He presents today with his wife.  He has complained of decreased hearing as well as some popping and crackling in his ears almost as if he has water in his ears.  Over the past 6 months his hearing has gradually gotten worse.  Past Medical History:  Diagnosis Date  . Cancer [facility]    Past Surgical History:  Procedure Laterality Date  . COLON SURGERY     Social History   Socioeconomic History  . Marital status: Married    Spouse name: Not on file  . Number of children: Not on file  . Years of education: Not on file  . Highest education level: Not on file  Occupational History  . Not on file  Tobacco Use  . Smoking status: Former Smoker    Packs/day: 0.30    Years: 61.00    Pack years: 18.30    Types: Cigarettes    Start date: 52    Quit date: 03/03/2020    Years since quitting: 0.0  . Smokeless tobacco: Never Used  Substance and Sexual Activity  . Alcohol use: Yes  . Drug use: No  . Sexual activity: Not on file  Other Topics Concern  . Not on file  Social History Narrative  . Not on file   Social Determinants of Health   Financial Resource Strain:   . Difficulty of Paying Living Expenses: Not on file  Food Insecurity:   . Worried About Charity fundraiser in the Last Year: Not on file  . Ran Out of Food in the Last Year: Not on file  Transportation Needs:   . Lack of Transportation (Medical): Not on file  . Lack of Transportation (Non-Medical): Not on file  Physical Activity:   . Days of Exercise per Week: Not on file  . Minutes of Exercise per Session: Not on file  Stress:   . Feeling of Stress : Not on file  Social Connections:   . Frequency of Communication with Friends and Family: Not on file  . Frequency of Social Gatherings with Friends and Family: Not on file  . Attends Religious Services: Not on file  . Active Member of Clubs or Organizations: Not on file  . Attends  Archivist Meetings: Not on file  . Marital Status: Not on file   No family history on file. Allergies  Allergen Reactions  . Penicillins Hives and Shortness Of Breath   Prior to Admission medications   Medication Sig Start Date End Date Taking? Authorizing Provider  aspirin EC 81 MG tablet Take 81 mg by mouth daily.    [provider]  hydrochlorothiazide (MICROZIDE) 12.5 MG capsule Take 12.5 mg by mouth daily.    [provider]  Multiple Vitamin (MULTIVITAMIN WITH MINERALS) TABS Take 1 tablet by mouth daily.    [provider]  nebivolol (BYSTOLIC) 10 MG tablet Take 20 mg by mouth daily.    [provider]  ondansetron (ZOFRAN-ODT) 4 MG disintegrating tablet Take 1 tablet (4 mg total) by mouth every 8 (eight) hours as needed for nausea. 08/23/12   Moreno-Coll, Adlih, MD  oxyCODONE-acetaminophen (PERCOCET) 7.5-325 MG per tablet Take 1 tablet by mouth every 4 (four) hours as needed for pain. 08/23/12   Leonard Schwartz, MD     Positive ROS: Otherwise negative  All other systems have been reviewed and were otherwise negative with the exception  of those mentioned in the HPI and as above.  Physical Exam: Constitutional: Alert, well-appearing, no acute distress Ears: External ears without lesions or tenderness.  He has moderate wax buildup in both ears right side worse than left.  This was cleaned with suction forceps and curettes.  After cleaning the wax from his ears his TMs were otherwise clear. Nasal: External nose without lesions. Clear nasal passages Oral: Lips and gums without lesions. Tongue and palate mucosa without lesions. Posterior oropharynx clear. Neck: No palpable adenopathy or masses Respiratory: Breathing comfortably  Skin: No facial/neck lesions or rash noted.  Audiogram in the office today demonstrated a mild downsloping SNHL in both ears which was symmetric with SRT's of 30 dB bilaterally.  He had type A tympanograms  bilaterally.  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

## 2020-03-22 ENCOUNTER — Encounter (INDEPENDENT_AMBULATORY_CARE_PROVIDER_SITE_OTHER): Payer: Self-pay

## 2020-04-05 ENCOUNTER — Ambulatory Visit: Payer: Medicare Other | Attending: Internal Medicine

## 2020-04-05 DIAGNOSIS — Z23 Encounter for immunization: Secondary | ICD-10-CM

## 2020-04-05 NOTE — Progress Notes (Signed)
   Covid-19 Vaccination Clinic  Name:  Alan [name]    MRN: [id number] DOB: 1940/12/13  04/05/2020  Mr. Alan [name] was observed post Covid-19 immunization for 30 minutes based on pre-vaccination screening without incident. He was provided with Vaccine Information Sheet and instruction to access the V-Safe system.   Mr. Alan [name] was instructed to call 911 with any severe reactions post vaccine: Marland Kitchen Difficulty breathing  . Swelling of face and throat  . A fast heartbeat  . A bad rash all over body  . Dizziness and weakness

## 2020-07-23 ENCOUNTER — Ambulatory Visit (HOSPITAL_COMMUNITY)
Admission: EM | Admit: 2020-07-23 | Discharge: 2020-07-23 | Disposition: A | Discharge status: Home / Self Care | Payer: Medicare Other

## 2020-07-23 ENCOUNTER — Encounter (HOSPITAL_COMMUNITY): Payer: Self-pay | Admitting: Emergency Medicine

## 2020-07-23 ENCOUNTER — Other Ambulatory Visit: Payer: Self-pay

## 2020-07-23 ENCOUNTER — Ambulatory Visit (INDEPENDENT_AMBULATORY_CARE_PROVIDER_SITE_OTHER): Payer: Medicare Other

## 2020-07-23 DIAGNOSIS — R0989 Other specified symptoms and signs involving the circulatory and respiratory systems: Secondary | ICD-10-CM

## 2020-07-23 DIAGNOSIS — R0602 Shortness of breath: Secondary | ICD-10-CM | POA: Diagnosis not present

## 2020-07-23 DIAGNOSIS — R079 Chest pain, unspecified: Secondary | ICD-10-CM | POA: Diagnosis not present

## 2020-07-23 DIAGNOSIS — R059 Cough, unspecified: Secondary | ICD-10-CM

## 2020-07-23 MED ORDER — ALBUTEROL SULFATE HFA 108 (90 BASE) MCG/ACT IN AERS: 2.0000 | INHALATION_SPRAY | RESPIRATORY_TRACT | 0 refills | Status: DC | PRN

## 2020-07-23 MED ORDER — PREDNISONE 10 MG PO TABS: 40.0000 mg | ORAL_TABLET | Freq: Every day | ORAL | 0 refills | Status: AC

## 2020-07-23 NOTE — Discharge Instructions (Addendum)
Take the prednisone and use the albuterol inhaler as directed.    Follow up with your primary care provider tomorrow.

## 2020-07-23 NOTE — ED Provider Notes (Signed)
[place]    CSN: [id number] Arrival date & time: 07/23/20  1534      History   Chief Complaint Chief Complaint  Patient presents with  . Cough  . Shortness of Breath    HPI Alan [name] is a 80 y.o. male.   Patient presents with 2-week history of cough, shortness of breath, congestion.  He denies fever, chills, vomiting, diarrhea, or other symptoms.  He states he had a negative Covid test yesterday.  He was instructed by his PCP to come here for a chest x-ray.  His medical history includes malignant carcinoid tumor of small intestine.  Smoker x 61 years; he states he hasn't smoked in 6 days.  The history is provided by the patient and medical records.    Past Medical History:  Diagnosis Date  . Cancer ([place])     There are no problems to display for this patient.   Past Surgical History:  Procedure Laterality Date  . COLON SURGERY         Home Medications    Prior to Admission medications   Medication Sig Start Date End Date Taking? Authorizing Provider  albuterol (VENTOLIN HFA) 108 (90 Base) MCG/ACT inhaler Inhale 2 puffs into the lungs every 4 (four) hours as needed for shortness of breath. 07/23/20  Yes Sharion Balloon, NP  predniSONE (DELTASONE) 10 MG tablet Take 4 tablets (40 mg total) by mouth daily for 5 days. 07/23/20 07/28/20 Yes Sharion Balloon, NP  aspirin EC 81 MG tablet Take 81 mg by mouth daily.    [provider]  hydrochlorothiazide (MICROZIDE) 12.5 MG capsule Take 12.5 mg by mouth daily.    [provider]  Multiple Vitamin (MULTIVITAMIN WITH MINERALS) TABS Take 1 tablet by mouth daily.    [provider]  nebivolol (BYSTOLIC) 10 MG tablet Take 20 mg by mouth daily.    [provider]  olmesartan-hydrochlorothiazide (BENICAR HCT) 20-12.5 MG tablet Take 1 tablet by mouth daily. 07/08/20   [provider]  ondansetron (ZOFRAN-ODT) 4 MG disintegrating tablet Take 1 tablet (4 mg total) by mouth every 8  (eight) hours as needed for nausea. 08/23/12   Moreno-Coll, Adlih, MD  oxyCODONE-acetaminophen (PERCOCET) 7.5-325 MG per tablet Take 1 tablet by mouth every 4 (four) hours as needed for pain. 08/23/12   Leonard Schwartz, MD  tamsulosin (FLOMAX) 0.4 MG CAPS capsule Take 0.4 mg by mouth daily. 07/08/20   [provider]  Vitamin D, Ergocalciferol, (DRISDOL) 1.25 MG (50000 UNIT) CAPS capsule Take by mouth. 07/08/20   [provider]    Family History History reviewed. No pertinent family history.  Social History Social History   Tobacco Use  . Smoking status: Former Smoker    Packs/day: 0.30    Years: 61.00    Pack years: 18.30    Types: Cigarettes    Start date: 23    Quit date: 03/03/2020    Years since quitting: 0.3  . Smokeless tobacco: Never Used  Substance Use Topics  . Alcohol use: Yes  . Drug use: No     Allergies   Penicillins   Review of Systems Review of Systems  Constitutional: Negative for chills and fever.  HENT: Positive for congestion. Negative for ear pain and sore throat.   Eyes: Negative for pain and visual disturbance.  Respiratory: Positive for cough and shortness of breath.   Cardiovascular: Negative for chest pain and palpitations.  Gastrointestinal: Negative for abdominal pain, diarrhea and  vomiting.  Genitourinary: Negative for dysuria and hematuria.  Musculoskeletal: Negative for arthralgias and back pain.  Skin: Negative for color change and rash.  Neurological: Negative for seizures and syncope.  All other systems reviewed and are negative.    Physical Exam Triage Vital Signs ED Triage Vitals  Enc Vitals Group     BP      Pulse      Resp      Temp      Temp src      SpO2      Weight      Height      Head Circumference      Peak Flow      Pain Score      Pain Loc      Pain Edu?      Excl. in [place]?    No data found.  Updated Vital Signs BP 128/76 (BP Location: Right Arm)   Pulse (!) 105   Temp 98.6 F (37 C)  (Oral)   Resp 18   SpO2 98%   Visual Acuity Right Eye Distance:   Left Eye Distance:   Bilateral Distance:    Right Eye Near:   Left Eye Near:    Bilateral Near:     Physical Exam Vitals and nursing note reviewed.  Constitutional:      General: He is not in acute distress.    Appearance: He is well-developed and well-nourished. He is not ill-appearing.  HENT:     Head: Normocephalic and atraumatic.     Mouth/Throat:     Mouth: Mucous membranes are moist.  Eyes:     Conjunctiva/sclera: Conjunctivae normal.  Cardiovascular:     Rate and Rhythm: Normal rate and regular rhythm.     Heart sounds: No murmur heard.   Pulmonary:     Effort: Pulmonary effort is normal. No respiratory distress.     Breath sounds: Rhonchi present.     Comments: Few scattered rhonchi throughout. No respiratory distress.  Abdominal:     Palpations: Abdomen is soft.     Tenderness: There is no abdominal tenderness.  Musculoskeletal:        General: No edema.     Cervical back: Neck supple.  Skin:    General: Skin is warm and dry.  Neurological:     General: No focal deficit present.     Mental Status: He is alert and oriented to person, place, and time.     Gait: Gait normal.  Psychiatric:        Mood and Affect: Mood and affect and mood normal.        Behavior: Behavior normal.      UC Treatments / Results  Labs (all labs ordered are listed, but only abnormal results are displayed) Labs Reviewed - No data to display  EKG   Radiology DG Chest 2 View  Result Date: 07/23/2020 CLINICAL DATA:  Preop cough congestion EXAM: CHEST - 2 VIEW COMPARISON:  03/28/2010 FINDINGS: Streaky left basilar opacity, likely atelectasis or scar. No focal consolidation or effusion. Normal cardiomediastinal silhouette with aortic atherosclerosis. No pneumothorax IMPRESSION: No active cardiopulmonary disease. Mild streaky left basilar atelectasis or scar. Electronically Signed   By: Donavan Foil M.D.   On:  07/23/2020 16:35    Procedures Procedures (including critical care time)  Medications Ordered in UC Medications - No data to display  Initial Impression / Assessment and Plan / UC Course  I have reviewed the triage vital signs  and the nursing notes.  Pertinent labs & imaging results that were available during my care of the patient were reviewed by me and considered in my medical decision making (see chart for details).   Cough, shortness of breath. Patient is in no respiratory distress; O2 sat 98%. Chest x-ray shows mild left basilar atelectasis or scar. Treating with prednisone and albuterol inhaler. Instructed patient to call his PCP in the morning to schedule an appointment for follow-up.  He agrees to plan of care.   Final Clinical Impressions(s) / UC Diagnoses   Final diagnoses:  Shortness of breath  Cough     Discharge Instructions     Take the prednisone and use the albuterol inhaler as directed.    Follow up with your primary care provider tomorrow.        ED Prescriptions    Medication Sig Dispense Auth. Provider   predniSONE (DELTASONE) 10 MG tablet Take 4 tablets (40 mg total) by mouth daily for 5 days. 20 tablet Sharion Balloon, NP   albuterol (VENTOLIN HFA) 108 (90 Base) MCG/ACT inhaler Inhale 2 puffs into the lungs every 4 (four) hours as needed for shortness of breath. 18 g Sharion Balloon, NP     PDMP not reviewed this encounter.   Sharion Balloon, NP 07/23/20 [phone]

## 2020-07-23 NOTE — ED Triage Notes (Signed)
Pt presents with cough, nasal congestion, and sob xs 2 weeks. States was tested for COVID yesterday for Pre-op. Was sent by PCP for chest x-ray.

## 2021-01-16 ENCOUNTER — Observation Stay (HOSPITAL_BASED_OUTPATIENT_CLINIC_OR_DEPARTMENT_OTHER)
Admission: EM | Admit: 2021-01-16 | Discharge: 2021-01-17 | Disposition: A | Discharge status: Home / Self Care | Payer: Medicare Other | Source: Home / Self Care | Attending: Emergency Medicine | Admitting: Emergency Medicine

## 2021-01-16 ENCOUNTER — Encounter (HOSPITAL_COMMUNITY): Payer: Self-pay | Admitting: *Deleted

## 2021-01-16 ENCOUNTER — Emergency Department (HOSPITAL_COMMUNITY): Payer: Medicare Other

## 2021-01-16 ENCOUNTER — Other Ambulatory Visit: Payer: Self-pay

## 2021-01-16 ENCOUNTER — Inpatient Hospital Stay (HOSPITAL_COMMUNITY): Payer: Medicare Other

## 2021-01-16 DIAGNOSIS — E876 Hypokalemia: Secondary | ICD-10-CM | POA: Insufficient documentation

## 2021-01-16 DIAGNOSIS — Z7982 Long term (current) use of aspirin: Secondary | ICD-10-CM | POA: Insufficient documentation

## 2021-01-16 DIAGNOSIS — W01198A Fall on same level from slipping, tripping and stumbling with subsequent striking against other object, initial encounter: Secondary | ICD-10-CM | POA: Insufficient documentation

## 2021-01-16 DIAGNOSIS — Z87891 Personal history of nicotine dependence: Secondary | ICD-10-CM | POA: Insufficient documentation

## 2021-01-16 DIAGNOSIS — M25552 Pain in left hip: Secondary | ICD-10-CM | POA: Insufficient documentation

## 2021-01-16 DIAGNOSIS — M25561 Pain in right knee: Secondary | ICD-10-CM | POA: Insufficient documentation

## 2021-01-16 DIAGNOSIS — I951 Orthostatic hypotension: Secondary | ICD-10-CM | POA: Diagnosis not present

## 2021-01-16 DIAGNOSIS — Z79899 Other long term (current) drug therapy: Secondary | ICD-10-CM | POA: Insufficient documentation

## 2021-01-16 DIAGNOSIS — Z23 Encounter for immunization: Secondary | ICD-10-CM | POA: Insufficient documentation

## 2021-01-16 DIAGNOSIS — R55 Syncope and collapse: Secondary | ICD-10-CM | POA: Diagnosis not present

## 2021-01-16 DIAGNOSIS — Z85030 Personal history of malignant carcinoid tumor of large intestine: Secondary | ICD-10-CM | POA: Insufficient documentation

## 2021-01-16 DIAGNOSIS — N1831 Chronic kidney disease, stage 3a: Secondary | ICD-10-CM | POA: Insufficient documentation

## 2021-01-16 DIAGNOSIS — R739 Hyperglycemia, unspecified: Secondary | ICD-10-CM | POA: Insufficient documentation

## 2021-01-16 DIAGNOSIS — I129 Hypertensive chronic kidney disease with stage 1 through stage 4 chronic kidney disease, or unspecified chronic kidney disease: Secondary | ICD-10-CM | POA: Insufficient documentation

## 2021-01-16 DIAGNOSIS — I2699 Other pulmonary embolism without acute cor pulmonale: Secondary | ICD-10-CM | POA: Diagnosis present

## 2021-01-16 DIAGNOSIS — D696 Thrombocytopenia, unspecified: Secondary | ICD-10-CM | POA: Insufficient documentation

## 2021-01-16 DIAGNOSIS — Z20822 Contact with and (suspected) exposure to covid-19: Secondary | ICD-10-CM | POA: Insufficient documentation

## 2021-01-16 LAB — URINALYSIS, ROUTINE W REFLEX MICROSCOPIC
Bilirubin Urine: NEGATIVE
Glucose, UA: NEGATIVE mg/dL
Hgb urine dipstick: NEGATIVE
Ketones, ur: NEGATIVE mg/dL
Leukocytes,Ua: NEGATIVE
Nitrite: NEGATIVE
Protein, ur: NEGATIVE mg/dL
Specific Gravity, Urine: 1.024 (ref 1.005–1.030)
pH: 5 (ref 5.0–8.0)

## 2021-01-16 LAB — CBC WITH DIFFERENTIAL/PLATELET
Abs Immature Granulocytes: 0.06 10*3/uL (ref 0.00–0.07)
Basophils Absolute: 0 10*3/uL (ref 0.0–0.1)
Basophils Relative: 0 %
Eosinophils Absolute: 0.1 10*3/uL (ref 0.0–0.5)
Eosinophils Relative: 1 %
HCT: 40.2 % (ref 39.0–52.0)
Hemoglobin: 13.8 g/dL (ref 13.0–17.0)
Immature Granulocytes: 0 %
Lymphocytes Relative: 9 %
Lymphs Abs: 1.3 10*3/uL (ref 0.7–4.0)
MCH: 31.4 pg (ref 26.0–34.0)
MCHC: 34.3 g/dL (ref 30.0–36.0)
MCV: 91.6 fL (ref 80.0–100.0)
Monocytes Absolute: 0.8 10*3/uL (ref 0.1–1.0)
Monocytes Relative: 6 %
Neutro Abs: 11.5 10*3/uL — ABNORMAL HIGH (ref 1.7–7.7)
Neutrophils Relative %: 84 %
Platelets: 142 10*3/uL — ABNORMAL LOW (ref 150–400)
RBC: 4.39 MIL/uL (ref 4.22–5.81)
RDW: 13.1 % (ref 11.5–15.5)
WBC: 13.7 10*3/uL — ABNORMAL HIGH (ref 4.0–10.5)
nRBC: 0 % (ref 0.0–0.2)

## 2021-01-16 LAB — COMPREHENSIVE METABOLIC PANEL
ALT: 18 U/L (ref 0–44)
AST: 19 U/L (ref 15–41)
Albumin: 3.7 g/dL (ref 3.5–5.0)
Alkaline Phosphatase: 44 U/L (ref 38–126)
Anion gap: 11 (ref 5–15)
BUN: 31 mg/dL — ABNORMAL HIGH (ref 8–23)
CO2: 27 mmol/L (ref 22–32)
Calcium: 9.4 mg/dL (ref 8.9–10.3)
Chloride: 105 mmol/L (ref 98–111)
Creatinine, Ser: 1.56 mg/dL — ABNORMAL HIGH (ref 0.61–1.24)
GFR, Estimated: 45 mL/min — ABNORMAL LOW (ref >60)
Glucose, Bld: 148 mg/dL — ABNORMAL HIGH (ref 70–99)
Potassium: 3 mmol/L — ABNORMAL LOW (ref 3.5–5.1)
Sodium: 143 mmol/L (ref 135–145)
Total Bilirubin: 1.1 mg/dL (ref 0.3–1.2)
Total Protein: 6.5 g/dL (ref 6.5–8.1)

## 2021-01-16 LAB — PROTIME-INR
INR: 1 (ref 0.8–1.2)
Prothrombin Time: 13.5 seconds (ref 11.4–15.2)

## 2021-01-16 LAB — RESP PANEL BY RT-PCR (FLU A&B, COVID) ARPGX2
Influenza A by PCR: NEGATIVE
Influenza B by PCR: NEGATIVE
SARS Coronavirus 2 by RT PCR: NEGATIVE

## 2021-01-16 LAB — PHOSPHORUS: Phosphorus: 2.8 mg/dL (ref 2.5–4.6)

## 2021-01-16 LAB — MAGNESIUM: Magnesium: 2.2 mg/dL (ref 1.7–2.4)

## 2021-01-16 LAB — D-DIMER, QUANTITATIVE: D-Dimer, Quant: 12.54 ug/mL-FEU — ABNORMAL HIGH (ref 0.00–0.50)

## 2021-01-16 MED ORDER — ENOXAPARIN SODIUM 80 MG/0.8ML IJ SOSY
80.0000 mg | PREFILLED_SYRINGE | Freq: Once | INTRAMUSCULAR | Status: AC
  Administered 2021-01-16: 80 mg via SUBCUTANEOUS
  Filled 2021-01-16: qty 0.8

## 2021-01-16 MED ORDER — POTASSIUM CHLORIDE CRYS ER 20 MEQ PO TBCR
40.0000 meq | EXTENDED_RELEASE_TABLET | Freq: Two times a day (BID) | ORAL | Status: DC
  Administered 2021-01-16 – 2021-01-17 (×3): 40 meq via ORAL
  Filled 2021-01-16 (×3): qty 2

## 2021-01-16 MED ORDER — SODIUM CHLORIDE 0.9 % IV BOLUS
500.0000 mL | Freq: Once | INTRAVENOUS | Status: AC
  Administered 2021-01-16: 500 mL via INTRAVENOUS

## 2021-01-16 MED ORDER — IOHEXOL 350 MG/ML SOLN
100.0000 mL | Freq: Once | INTRAVENOUS | Status: AC | PRN
  Administered 2021-01-16: 64 mL via INTRAVENOUS

## 2021-01-16 MED ORDER — ENOXAPARIN SODIUM 80 MG/0.8ML IJ SOSY
80.0000 mg | PREFILLED_SYRINGE | Freq: Two times a day (BID) | INTRAMUSCULAR | Status: DC
  Administered 2021-01-17: 80 mg via SUBCUTANEOUS
  Filled 2021-01-16: qty 0.8

## 2021-01-16 MED ORDER — SODIUM CHLORIDE (PF) 0.9 % IJ SOLN
INTRAMUSCULAR | Status: AC
  Filled 2021-01-16: qty 50

## 2021-01-16 MED ORDER — ENOXAPARIN SODIUM 60 MG/0.6ML IJ SOSY
60.0000 mg | PREFILLED_SYRINGE | Freq: Once | INTRAMUSCULAR | Status: AC
  Administered 2021-01-16: 60 mg via SUBCUTANEOUS
  Filled 2021-01-16 (×2): qty 0.6

## 2021-01-16 NOTE — ED Notes (Signed)
PT c/o R knee pain.  MD notified.

## 2021-01-16 NOTE — ED Notes (Signed)
Admitting at bedside 

## 2021-01-16 NOTE — ED Triage Notes (Signed)
Pt here via [facility] from home for weakness, fall and sob.  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  AO x 4.  Initial sats of 81%.  Sats increased to 92% on 3 L.  Given 5 of albuterol en-route with improved sats.  No loc. When pt fell, he hit his bottom against wall.

## 2021-01-16 NOTE — ED Provider Notes (Signed)
Alan [name] Provider Note   CSN: [id number] Arrival date & time: 01/16/21  7858     History Chief Complaint  Patient presents with   Fall   Weakness    [name] is a 80 y.o. male.  80 year old male with prior medical history as detailed below presents for evaluation.  Patient with history of metastatic carcinoid cancer.  Patient reports that he was ambulating from his bed to the bathroom this morning.  He suddenly felt very weak.  He tried to get to a seated position but was unsuccessful.  He did fall.  He struck his head.  He did not pass out.  He complains of pain to the left hip.  He was able to stand after the fall.  He denies recent illness.  He reports that his breathing feels improved now after administration of albuterol en route with EMS.  The history is provided by the patient.  Fall This is a new problem. The current episode started 1 to 2 hours ago. The problem occurs every several days. The problem has not changed since onset.Associated symptoms include shortness of breath. Pertinent negatives include no chest pain. Nothing aggravates the symptoms. Nothing relieves the symptoms.  Weakness Associated symptoms: shortness of breath   Associated symptoms: no chest pain       Past Medical History:  Diagnosis Date   Cancer ([place])     There are no problems to display for this patient.   Past Surgical History:  Procedure Laterality Date   COLON SURGERY         No family history on file.  Social History   Tobacco Use   Smoking status: Former    Packs/day: 0.30    Years: 61.00    Pack years: 18.30    Types: Cigarettes    Start date: 74    Quit date: 03/03/2020    Years since quitting: 0.8   Smokeless tobacco: Never  Substance Use Topics   Alcohol use: Yes   Drug use: No    Home Medications Prior to Admission medications   Medication Sig Start Date End Date Taking? Authorizing Provider  albuterol (VENTOLIN  HFA) 108 (90 Base) MCG/ACT inhaler Inhale 2 puffs into the lungs every 4 (four) hours as needed for shortness of breath. 07/23/20   Sharion Balloon, NP  aspirin EC 81 MG tablet Take 81 mg by mouth daily.    [provider]  hydrochlorothiazide (MICROZIDE) 12.5 MG capsule Take 12.5 mg by mouth daily.    [provider]  Multiple Vitamin (MULTIVITAMIN WITH MINERALS) TABS Take 1 tablet by mouth daily.    [provider]  nebivolol (BYSTOLIC) 10 MG tablet Take 20 mg by mouth daily.    [provider]  olmesartan-hydrochlorothiazide (BENICAR HCT) 20-12.5 MG tablet Take 1 tablet by mouth daily. 07/08/20   [provider]  ondansetron (ZOFRAN-ODT) 4 MG disintegrating tablet Take 1 tablet (4 mg total) by mouth every 8 (eight) hours as needed for nausea. 08/23/12   Moreno-Coll, Adlih, MD  oxyCODONE-acetaminophen (PERCOCET) 7.5-325 MG per tablet Take 1 tablet by mouth every 4 (four) hours as needed for pain. 08/23/12   Leonard Schwartz, MD  tamsulosin (FLOMAX) 0.4 MG CAPS capsule Take 0.4 mg by mouth daily. 07/08/20   [provider]  Vitamin D, Ergocalciferol, (DRISDOL) 1.25 MG (50000 UNIT) CAPS capsule Take by mouth. 07/08/20   [provider]    Allergies    Penicillins  Review  of Systems   Review of Systems  Respiratory:  Positive for shortness of breath.   Cardiovascular:  Negative for chest pain.  Neurological:  Positive for weakness.  All other systems reviewed and are negative.  Physical Exam Updated Vital Signs BP 122/73 (BP Location: Left Arm)   Pulse 75   Temp 97.7 F (36.5 C) (Oral)   Resp 18   Ht 6\' 2"  (1.88 m)   Wt 78.5 kg   SpO2 90%   BMI 22.21 kg/m   Physical Exam Vitals and nursing note reviewed.  Constitutional:      General: He is not in acute distress.    Appearance: Normal appearance. He is well-developed.  HENT:     Head: Normocephalic and atraumatic.  Eyes:     Conjunctiva/sclera: Conjunctivae normal.      Pupils: Pupils are equal, round, and reactive to light.  Cardiovascular:     Rate and Rhythm: Normal rate and regular rhythm.     Heart sounds: Normal heart sounds.  Pulmonary:     Effort: Pulmonary effort is normal. No respiratory distress.     Breath sounds: Normal breath sounds.  Abdominal:     General: There is no distension.     Palpations: Abdomen is soft.     Tenderness: There is no abdominal tenderness.  Musculoskeletal:        General: No deformity. Normal range of motion.     Cervical back: Normal range of motion and neck supple.  Skin:    General: Skin is warm and dry.  Neurological:     General: No focal deficit present.     Mental Status: He is alert and oriented to person, place, and time.    ED Results / Procedures / Treatments   Labs (all labs ordered are listed, but only abnormal results are displayed) Labs Reviewed  URINALYSIS, ROUTINE W REFLEX MICROSCOPIC  COMPREHENSIVE METABOLIC PANEL  CBC WITH DIFFERENTIAL/PLATELET  PROTIME-INR  D-DIMER, QUANTITATIVE    EKG None  Radiology No results found.  Procedures Procedures   Medications Ordered in ED Medications - No data to display  ED Course  I have reviewed the triage vital signs and the nursing notes.  Pertinent labs & imaging results that were available during my care of the patient were reviewed by me and considered in my medical decision making (see chart for details).    MDM Rules/Calculators/A&P                           MDM  MSE complete  Cadence Haslam Rager was evaluated in Emergency Department on 01/16/2021 for the symptoms described in the history of present illness. He was evaluated in the context of the global COVID-19 pandemic, which necessitated consideration that the patient might be at risk for infection with the SARS-CoV-2 virus that causes COVID-19. Institutional protocols and algorithms that pertain to the evaluation of patients at risk for COVID-19 are in a state of rapid change  based on information released by regulatory bodies including the CDC and federal and state organizations. These policies and algorithms were followed during the patient's care in the ED.  Patient presented with complaint of generalized weakness and associated fall.  Patient was noted to be moderately hypoxic in the field with EMS.  Patient with elevated D-dimer on screening labs.  CT angio of the chest reveals evidence of pulmonary emboli.  No significant right heart strain on CT.  Patient would benefit from  admission for further work-up and treatment.  Hospitalist service Marylyn Ishihara) aware of case and will evaluate for same.   Final Clinical Impression(s) / ED Diagnoses Final diagnoses:  Pulmonary embolism, other, unspecified chronicity, unspecified whether acute cor pulmonale present [facility])    Rx / DC Orders ED Discharge Orders     None        Valarie Merino, MD 01/16/21 1310

## 2021-01-16 NOTE — ED Notes (Signed)
PT to CT.

## 2021-01-16 NOTE — H&P (Signed)
History and Physical    Brason Berthelot Pickard NUU:[id number] DOB: [birth year] DOA: 01/16/2021  PCP: Vincente Liberty, MD  Patient coming from: Home  Chief Complaint: weakness  HPI: Alan [name] is a 80 y.o. male with medical history significant of carcinoid tumor, HTN, BPH. Presenting with weakness and near syncopal episode. He reports that he was in his normal state of health until this morning. He got up to go to the bathroom and returned to bed without incidence around 7am. However, the next time he got up, he started walking out of the room and felt weak. He felt "as if the lights were going out." He got down in a crouched position to try to steady himself, but then he fell over. He remembers the entire fall. There was no LOC or head injury. He was able to get up and move himself to his bed. EMS was called. During their evaluation, the patient was noted to be hypoxic. He says he felt a little short of breath but didn't think much of it. He was brought to the ED for further eval.   Of note, he reports that he has had poor PO intake due to his cancer. His appetite is decreased, but he also has uncontrolled diarrhea when he does eat. He admits to an element of diet restriction due to the uncomfortable nature of his diarrhea.   ED Course: CTA chest showed a PE. He was started on anticoagulation. TRH was called for admission.   Review of Systems:  Denies CP, palpitations, N/V, fever. Reports poor appetite and PO intake, right knee pain, chronic diarrhea. Review of systems is otherwise negative for all not mentioned in HPI.   PMHx Past Medical History:  Diagnosis Date   Cancer [facility])     PSHx Past Surgical History:  Procedure Laterality Date   COLON SURGERY      SocHx  reports that he quit smoking about 10 months ago. His smoking use included cigarettes. He started smoking about 62 years ago. He has a 18.30 pack-year smoking history. He has never used smokeless tobacco. He reports current  alcohol use. He reports that he does not use drugs.  Allergies  Allergen Reactions   Penicillins Hives and Shortness Of Breath   Lisinopril Itching    FamHx No family history on file.  Prior to Admission medications   Medication Sig Start Date End Date Taking? Authorizing Provider  albuterol (VENTOLIN HFA) 108 (90 Base) MCG/ACT inhaler Inhale 2 puffs into the lungs every 4 (four) hours as needed for shortness of breath. 07/23/20   Sharion Balloon, NP  aspirin EC 81 MG tablet Take 81 mg by mouth daily.    [provider]  hydrochlorothiazide (MICROZIDE) 12.5 MG capsule Take 12.5 mg by mouth daily.    [provider]  Multiple Vitamin (MULTIVITAMIN WITH MINERALS) TABS Take 1 tablet by mouth daily.    [provider]  nebivolol (BYSTOLIC) 10 MG tablet Take 20 mg by mouth daily.    [provider]  olmesartan-hydrochlorothiazide (BENICAR HCT) 20-12.5 MG tablet Take 1 tablet by mouth daily. 07/08/20   [provider]  ondansetron (ZOFRAN-ODT) 4 MG disintegrating tablet Take 1 tablet (4 mg total) by mouth every 8 (eight) hours as needed for nausea. 08/23/12   Moreno-Coll, Adlih, MD  oxyCODONE-acetaminophen (PERCOCET) 7.5-325 MG per tablet Take 1 tablet by mouth every 4 (four) hours as needed for pain. 08/23/12   Leonard Schwartz, MD  tamsulosin (FLOMAX) 0.4 MG CAPS capsule  Take 0.4 mg by mouth daily. 07/08/20   [provider]  Vitamin D, Ergocalciferol, (DRISDOL) 1.25 MG (50000 UNIT) CAPS capsule Take by mouth. 07/08/20   [provider]    Physical Exam: Vitals:   01/16/21 1130 01/16/21 1154 01/16/21 1230 01/16/21 1300  BP: 140/72 (!) 165/85 (!) 157/96 135/81  Pulse: 67 64 (!) 58 68  Resp: 15 18 (!) 27 (!) 24  Temp:      TempSrc:      SpO2: 98% 98% 96% 96%  Weight:      Height:        General: 80 y.o. male resting in bed in NAD Eyes: PERRL, normal sclera ENMT: Nares patent w/o discharge, orophaynx clear, dentition normal, ears  w/o discharge/lesions/ulcers Neck: Supple, trachea midline Cardiovascular: brady, +S1, S2, no m/g/r, equal pulses throughout Respiratory: CTABL, no w/r/r, normal WOB GI: BS+, NDNT, no masses noted, no organomegaly noted MSK: No e/c/c Skin: No rashes, bruises, ulcerations noted Neuro: A&O x 3, no focal deficits Psyc: Appropriate interaction and affect, calm/cooperative  Labs on Admission: I have personally reviewed following labs and imaging studies  CBC: Recent Labs  Lab 01/16/21 0959  WBC 13.7*  NEUTROABS 11.5*  HGB 13.8  HCT 40.2  MCV 91.6  PLT 762*   Basic Metabolic Panel: Recent Labs  Lab 01/16/21 0959  NA 143  K 3.0*  CL 105  CO2 27  GLUCOSE 148*  BUN 31*  CREATININE 1.56*  CALCIUM 9.4   GFR: Estimated Creatinine Clearance: 42.6 mL/min (A) (by C-G formula based on SCr of 1.56 mg/dL (H)). Liver Function Tests: Recent Labs  Lab 01/16/21 0959  AST 19  ALT 18  ALKPHOS 44  BILITOT 1.1  PROT 6.5  ALBUMIN 3.7   No results for input(s): LIPASE, AMYLASE in the last 168 hours. No results for input(s): AMMONIA in the last 168 hours. Coagulation Profile: Recent Labs  Lab 01/16/21 0959  INR 1.0   Cardiac Enzymes: No results for input(s): CKTOTAL, CKMB, CKMBINDEX, TROPONINI in the last 168 hours. BNP (last 3 results) No results for input(s): PROBNP in the last 8760 hours. HbA1C: No results for input(s): HGBA1C in the last 72 hours. CBG: No results for input(s): GLUCAP in the last 168 hours. Lipid Profile: No results for input(s): CHOL, HDL, LDLCALC, TRIG, CHOLHDL, LDLDIRECT in the last 72 hours. Thyroid Function Tests: No results for input(s): TSH, T4TOTAL, FREET4, T3FREE, THYROIDAB in the last 72 hours. Anemia Panel: No results for input(s): VITAMINB12, FOLATE, FERRITIN, TIBC, IRON, RETICCTPCT in the last 72 hours. Urine analysis:    Component Value Date/Time   COLORURINE YELLOW 08/23/2012 [place] 08/23/2012 2225   LABSPEC 1.018  08/23/2012 2225   PHURINE 5.0 08/23/2012 2225   GLUCOSEU NEGATIVE 08/23/2012 2225   HGBUR MODERATE (A) 08/23/2012 2225   BILIRUBINUR NEGATIVE 08/23/2012 2225   KETONESUR NEGATIVE 08/23/2012 2225   PROTEINUR 100 (A) 08/23/2012 2225   UROBILINOGEN 0.2 08/23/2012 2225   NITRITE NEGATIVE 08/23/2012 2225   LEUKOCYTESUR NEGATIVE 08/23/2012 2225    Radiological Exams on Admission: DG Chest 2 View  Result Date: 01/16/2021 CLINICAL DATA:  Recent fall today with left hip pain, initial encounter EXAM: CHEST - 2 VIEW COMPARISON:  07/23/2020 FINDINGS: Cardiac shadow is stable. Aortic calcifications are noted. The lungs are well aerated bilaterally. No focal infiltrate or effusion is seen. No bony abnormality is noted. Skin fold is noted over the left chest. IMPRESSION: No active cardiopulmonary disease. Electronically Signed  By: Inez Catalina M.D.   On: 01/16/2021 09:26   CT Head Wo Contrast  Result Date: 01/16/2021 CLINICAL DATA:  Minor head trauma.  History of metastatic disease. EXAM: CT HEAD WITHOUT CONTRAST TECHNIQUE: Contiguous axial images were obtained from the base of the skull through the vertex without intravenous contrast. COMPARISON:  None. FINDINGS: Brain: No evidence of acute infarction, hemorrhage, hydrocephalus, extra-axial collection or mass lesion/mass effect. Vascular: No hyperdense vessel or unexpected calcification. Skull: Normal. Negative for fracture or focal lesion. Sinuses/Orbits: No acute finding. IMPRESSION: Negative head CT. Electronically Signed   By: Monte Fantasia M.D.   On: 01/16/2021 09:34   CT Angio Chest PE W and/or Wo Contrast  Result Date: 01/16/2021 CLINICAL DATA:  Weakness and shortness of breath EXAM: CT ANGIOGRAPHY CHEST WITH CONTRAST TECHNIQUE: Multidetector CT imaging of the chest was performed using the standard protocol during bolus administration of intravenous contrast. Multiplanar CT image reconstructions and MIPs were obtained to evaluate the vascular  anatomy. CONTRAST:  34mL OMNIPAQUE IOHEXOL 350 MG/ML SOLN COMPARISON:  04/17/2010 FINDINGS: Cardiovascular: Atherosclerotic calcifications of the aorta are noted. No significant aortic opacification is identified to suggest dissection. No aneurysmal dilatation is noted. No cardiac enlargement is seen. Scattered coronary calcifications are noted. The pulmonary artery shows a normal branching pattern bilaterally. Tiny right middle lobe pulmonary emboli are seen. No other significant emboli are noted. No right heart strain is seen. Mediastinum/Nodes: Thoracic inlet is within normal limits. No sizable hilar or mediastinal adenopathy is noted. The esophagus demonstrates some retained fluid and food stuffs. This may be related to reflux. Lungs/Pleura: Lungs are well aerated bilaterally. Minimal dependent atelectatic changes are seen. No focal infiltrate or effusion is noted. No evidence of metastatic disease is seen. Upper Abdomen: Visualized upper abdomen shows renal cystic change on the left as well as cysts within the left lobe of the liver. No other focal abnormality in the upper abdomen is seen. Musculoskeletal: Degenerative changes of the thoracic spine are Review of the MIP images confirms the above findings. IMPRESSION: Small pulmonary emboli involving the right middle lobe as described. 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 Aortic Atherosclerosis (ICD10-I70.0). Electronically Signed   By: Inez Catalina M.D.   On: 01/16/2021 12:35   DG Hip Unilat W or Wo Pelvis 2-3 Views Left  Result Date: 01/16/2021 CLINICAL DATA:  Recent fall with left hip pain, initial encounter EXAM: DG HIP (WITH OR WITHOUT PELVIS) 2-3V LEFT COMPARISON:  None. FINDINGS: Pelvic ring is intact. Mild degenerative changes of left hip joint are noted. No acute fracture or dislocation is seen. No soft tissue abnormality is noted. Degenerative changes in the lower lumbar spine are noted. IMPRESSION: Degenerative change without acute  abnormality. Electronically Signed   By: Inez Catalina M.D.   On: 01/16/2021 09:27    EKG: Independently reviewed. 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     - we will complete a syncopal work up; will have PT/OT eval, rpt EKG  Hx of malignant carcinoid tumor     - continue outpt follow up with [facility] onco  Hypokalemia     - replace K+; check Mg2+  CKD 3a     - at baseline Scr, watch nephtoxins, follow  Thrombocytopenia     - mild, no evidence of bleed, follow  Hyperglycemia     - check A1c, follow   Right knee pain after fall     - check r knee film     - PRN norco  HTN     - continue home regimen when confirmed w/ the exception of the beta blocker for now  DVT prophylaxis: lovenox  Code Status: FULL  Family Communication: w/ wife at bedside  Consults called: None   Status is: Inpatient  Remains inpatient appropriate because:Inpatient level of care appropriate due to severity of illness  Dispo: The patient is from: Home              Anticipated d/c is to: Home              Patient currently is not medically stable to d/c.   Difficult to place patient No  Time spent coordinating admission: 70 minutes  [facility]  If 7PM-7AM, please contact night-coverage [email or web address]  01/16/2021, 1:49 PM

## 2021-01-17 ENCOUNTER — Inpatient Hospital Stay (HOSPITAL_BASED_OUTPATIENT_CLINIC_OR_DEPARTMENT_OTHER): Payer: Medicare Other

## 2021-01-17 DIAGNOSIS — R55 Syncope and collapse: Secondary | ICD-10-CM | POA: Diagnosis not present

## 2021-01-17 DIAGNOSIS — I2699 Other pulmonary embolism without acute cor pulmonale: Secondary | ICD-10-CM

## 2021-01-17 LAB — COMPREHENSIVE METABOLIC PANEL
ALT: 18 U/L (ref 0–44)
AST: 23 U/L (ref 15–41)
Albumin: 3.7 g/dL (ref 3.5–5.0)
Alkaline Phosphatase: 45 U/L (ref 38–126)
Anion gap: 6 (ref 5–15)
BUN: 26 mg/dL — ABNORMAL HIGH (ref 8–23)
CO2: 29 mmol/L (ref 22–32)
Calcium: 9.1 mg/dL (ref 8.9–10.3)
Chloride: 107 mmol/L (ref 98–111)
Creatinine, Ser: 1.29 mg/dL — ABNORMAL HIGH (ref 0.61–1.24)
GFR, Estimated: 56 mL/min — ABNORMAL LOW (ref >60)
Glucose, Bld: 125 mg/dL — ABNORMAL HIGH (ref 70–99)
Potassium: 3.5 mmol/L (ref 3.5–5.1)
Sodium: 142 mmol/L (ref 135–145)
Total Bilirubin: 1.8 mg/dL — ABNORMAL HIGH (ref 0.3–1.2)
Total Protein: 6.5 g/dL (ref 6.5–8.1)

## 2021-01-17 LAB — CBC
HCT: 35.8 % — ABNORMAL LOW (ref 39.0–52.0)
Hemoglobin: 12.7 g/dL — ABNORMAL LOW (ref 13.0–17.0)
MCH: 32 pg (ref 26.0–34.0)
MCHC: 35.5 g/dL (ref 30.0–36.0)
MCV: 90.2 fL (ref 80.0–100.0)
Platelets: 133 10*3/uL — ABNORMAL LOW (ref 150–400)
RBC: 3.97 MIL/uL — ABNORMAL LOW (ref 4.22–5.81)
RDW: 13 % (ref 11.5–15.5)
WBC: 10.2 10*3/uL (ref 4.0–10.5)
nRBC: 0 % (ref 0.0–0.2)

## 2021-01-17 LAB — HEMOGLOBIN A1C
Hgb A1c MFr Bld: 5.1 % (ref 4.8–5.6)
Mean Plasma Glucose: 99.67 mg/dL

## 2021-01-17 LAB — ECHOCARDIOGRAM COMPLETE
Area-P 1/2: 1.77 cm2
Height: 74.016 in
S' Lateral: 2.6 cm
Weight: 2387.2 oz

## 2021-01-17 MED ORDER — APIXABAN (ELIQUIS) VTE STARTER PACK (10MG AND 5MG): ORAL_TABLET | ORAL | 0 refills | Status: DC

## 2021-01-17 MED ORDER — HYDROCODONE-ACETAMINOPHEN 5-325 MG PO TABS
1.0000 | ORAL_TABLET | ORAL | Status: DC | PRN
  Administered 2021-01-17: 1 via ORAL
  Filled 2021-01-17: qty 1

## 2021-01-17 MED ORDER — SODIUM CHLORIDE 0.9% FLUSH
3.0000 mL | Freq: Two times a day (BID) | INTRAVENOUS | Status: DC
  Administered 2021-01-17: 3 mL via INTRAVENOUS

## 2021-01-17 MED ORDER — ONDANSETRON HCL 4 MG PO TABS: 4.0000 mg | ORAL_TABLET | Freq: Four times a day (QID) | ORAL | Status: DC | PRN

## 2021-01-17 MED ORDER — ONDANSETRON HCL 4 MG/2ML IJ SOLN: 4.0000 mg | Freq: Four times a day (QID) | INTRAMUSCULAR | Status: DC | PRN

## 2021-01-17 MED ORDER — ACETAMINOPHEN 650 MG RE SUPP: 650.0000 mg | Freq: Four times a day (QID) | RECTAL | Status: DC | PRN

## 2021-01-17 MED ORDER — ACETAMINOPHEN 325 MG PO TABS
650.0000 mg | ORAL_TABLET | Freq: Four times a day (QID) | ORAL | Status: DC | PRN
  Filled 2021-01-17: qty 2

## 2021-01-17 MED ORDER — PNEUMOCOCCAL VAC POLYVALENT 25 MCG/0.5ML IJ INJ
0.5000 mL | INJECTION | Freq: Once | INTRAMUSCULAR | Status: AC
  Administered 2021-01-17: 0.5 mL via INTRAMUSCULAR
  Filled 2021-01-17: qty 0.5

## 2021-01-17 NOTE — Discharge Summary (Signed)
Physician Discharge Summary  Alan [name] TIW:[id number] DOB: 1941/03/09 DOA: 01/16/2021  PCP: Vincente [place], MD  Admit date: 01/16/2021 Discharge date: 01/17/2021  Admitted From: Home Disposition: Home  Recommendations for Outpatient Follow-up:  Follow up with PCP in 1-2 weeks Please obtain BMP/CBC in one week Medication adjustment as instructions below.  Home Health: Not applicable Equipment/Devices: Not applicable  Discharge Condition: Stable CODE STATUS: Full code Diet recommendation: Low-salt diet.  Keep up with fluid intake with your ongoing diarrhea.  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  Patient describes total 4 episodes of presyncope and dizziness since January of this year.  Most of these events happen when he is upright.  2 of these events happened when he was in the shower and stooping to pick up soap.  Never lost consciousness.  He is on antihypertensives.  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 In the emergency room, hemodynamically stable.  Heart rate on monitor 49-60.  Sinus rhythm.  D-dimer was elevated.  Subsequent CTA showed right middle lobe pulmonary embolism without right heart strain.  Negative for orthostatic vitals.  Recurrent presyncopal episodes in a patient with known carcinoid tumor: Suspect Probably episodic vasodilatation related to carcinoid symptoms. Telemetry with no evidence of arrhythmias.  2D echocardiogram with no evidence of valvular heart disease or carcinoid infiltration in the valves.  Ejection fraction is normal. Plan: To avoid hypotension and dehydration, he will stop taking all antihypertensive medications.  I have advised him to take blood pressures at home sitting and standing at different circumstances and keep a record to bring  it to his primary care physician's office. We discussed about taking maximum precautions against falling and injury.  Orthostatic precautions. Right middle lobe subsegmental PE, on room air and mostly asymptomatic.  He has metastatic cancer, will treat with oral anticoagulation.  Patient is agreeable to start on Eliquis.  He is following up with his oncology clinic, they can continue to reassess and give him medications. Electrolytes were replaced and adequate.  Patient is mobilized today.  Physical therapy recommends outpatient physical therapy, however patient is not interested on it.  Patient with no symptoms today, interested to go home. Will be discharged and suggest outpatient follow-up. He is a scheduled to follow-up with his hematology oncology clinic next week.  Discharge Diagnoses:  Active Problems:   Acute pulmonary embolism [facility])    Discharge Instructions  Discharge Instructions     Call MD for:  persistant dizziness or light-headedness   Complete by: As directed    Diet - low sodium heart healthy   Complete by: As directed    Discharge instructions   Complete by: As directed    Your aspirin was discontinued as you were started on strong blood thinners. Do not take your blood pressure medication until repeat check at primary care physician's office. Please measure your blood pressures at home multiple times and keep a logbook and bring it to your doctors visit. Take precautions to avoid fall and injury, hold on something when standing upright for prolonged period.   Increase activity slowly   Complete by: As directed       Allergies as of 01/17/2021       Reactions   Penicillins Hives, Shortness Of Breath   Lisinopril Itching        Medication List     STOP  taking these medications    aspirin EC 81 MG tablet   ondansetron 4 MG disintegrating tablet Commonly known as: ZOFRAN-ODT   oxyCODONE-acetaminophen 7.5-325 MG tablet Commonly known as: Percocet        TAKE these medications    albuterol 108 (90 Base) MCG/ACT inhaler Commonly known as: VENTOLIN HFA Inhale 2 puffs into the lungs every 4 (four) hours as needed for shortness of breath.   Apixaban Starter Pack (10mg  and 5mg ) Commonly known as: ELIQUIS STARTER PACK Take as directed on package: start with two-5mg  tablets twice daily for 7 days. On day 8, switch to one-5mg  tablet twice daily.   multivitamin with minerals Tabs tablet Take 1 tablet by mouth daily.   olmesartan-hydrochlorothiazide 20-12.5 MG tablet Commonly known as: BENICAR HCT Take 1 tablet by mouth daily.   PRESERVISION AREDS 2 PO Take 1 capsule by mouth daily.   Symbicort 160-4.5 MCG/ACT inhaler Generic drug: budesonide-formoterol Inhale 2 puffs into the lungs at bedtime.   tamsulosin 0.4 MG Caps capsule Commonly known as: FLOMAX Take 0.4 mg by mouth daily.   Vitamin D (Ergocalciferol) 1.25 MG (50000 UNIT) Caps capsule Commonly known as: DRISDOL Take 50,000 Units by mouth every 7 (seven) days.        Follow-up Information     Vincente [place], MD Follow up in 2 week(s).   Specialty: Pulmonary Disease Contact information: [place] [address] [phone]                Allergies  Allergen Reactions   Penicillins Hives and Shortness Of Breath   Lisinopril Itching    Consultations: None   Procedures/Studies: DG Chest 2 View  Result Date: 01/16/2021 CLINICAL DATA:  Recent fall today with left hip pain, initial encounter EXAM: CHEST - 2 VIEW COMPARISON:  07/23/2020 FINDINGS: Cardiac shadow is stable. Aortic calcifications are noted. The lungs are well aerated bilaterally. No focal infiltrate or effusion is seen. No bony abnormality is noted. Skin fold is noted over the left chest. IMPRESSION: No active cardiopulmonary disease. Electronically Signed   By: Inez Catalina M.D.   On: 01/16/2021 09:26   DG Knee 1-2 Views Right  Result Date: 01/16/2021 CLINICAL  DATA:  Weakness, fell, metastatic liver cancer EXAM: RIGHT KNEE - 1-2 VIEW COMPARISON:  None. FINDINGS: Frontal and lateral views of the right knee are obtained. No fracture, subluxation, or dislocation. Moderate 3 compartmental osteoarthritis greatest in the lateral compartment. No joint effusion. Diffuse vascular calcifications. IMPRESSION: 1. Osteoarthritis.  No acute fracture. Electronically Signed   By: Randa Ngo M.D.   On: 01/16/2021 16:24   CT Head Wo Contrast  Result Date: 01/16/2021 CLINICAL DATA:  Minor head trauma.  History of metastatic disease. EXAM: CT HEAD WITHOUT CONTRAST TECHNIQUE: Contiguous axial images were obtained from the base of the skull through the vertex without intravenous contrast. COMPARISON:  None. FINDINGS: Brain: No evidence of acute infarction, hemorrhage, hydrocephalus, extra-axial collection or mass lesion/mass effect. Vascular: No hyperdense vessel or unexpected calcification. Skull: Normal. Negative for fracture or focal lesion. Sinuses/Orbits: No acute finding. IMPRESSION: Negative head CT. Electronically Signed   By: Monte Fantasia M.D.   On: 01/16/2021 09:34   CT Angio Chest PE W and/or Wo Contrast  Result Date: 01/16/2021 CLINICAL DATA:  Weakness and shortness of breath EXAM: CT ANGIOGRAPHY CHEST WITH CONTRAST TECHNIQUE: Multidetector CT imaging of the chest was performed using the standard protocol during bolus administration of intravenous contrast. Multiplanar CT image reconstructions and MIPs were  obtained to evaluate the vascular anatomy. CONTRAST:  51mL OMNIPAQUE IOHEXOL 350 MG/ML SOLN COMPARISON:  04/17/2010 FINDINGS: Cardiovascular: Atherosclerotic calcifications of the aorta are noted. No significant aortic opacification is identified to suggest dissection. No aneurysmal dilatation is noted. No cardiac enlargement is seen. Scattered coronary calcifications are noted. The pulmonary artery shows a normal branching pattern bilaterally. Tiny right middle  lobe pulmonary emboli are seen. No other significant emboli are noted. No right heart strain is seen. Mediastinum/Nodes: Thoracic inlet is within normal limits. No sizable hilar or mediastinal adenopathy is noted. The esophagus demonstrates some retained fluid and food stuffs. This may be related to reflux. Lungs/Pleura: Lungs are well aerated bilaterally. Minimal dependent atelectatic changes are seen. No focal infiltrate or effusion is noted. No evidence of metastatic disease is seen. Upper Abdomen: Visualized upper abdomen shows renal cystic change on the left as well as cysts within the left lobe of the liver. No other focal abnormality in the upper abdomen is seen. Musculoskeletal: Degenerative changes of the thoracic spine are Review of the MIP images confirms the above findings. IMPRESSION: Small pulmonary emboli involving the right middle lobe as described. 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 Aortic Atherosclerosis (ICD10-I70.0). Electronically Signed   By: Inez Catalina M.D.   On: 01/16/2021 12:35   ECHOCARDIOGRAM COMPLETE  Result Date: 01/17/2021    ECHOCARDIOGRAM REPORT   Patient Name:   Alan [name] [facility] Date of Exam: 01/17/2021 Medical Rec #:  [id number]       Height:       74.0 in Accession #:    [id number]      Weight:       149.2 lb Date of Birth:  December 26, 1940        BSA:          1.919 m Patient Age:    80 years        BP:           109/71 mmHg Patient Gender: M               HR:           49 bpm. Exam Location:  Inpatient Procedure: 2D Echo, Color Doppler and Cardiac Doppler Indications:    R55 Syncope  History:        Patient has prior history of Echocardiogram examinations, most                 recent 01/15/2016. Prior performed at [place]:    [place] Referring Phys: [phone] [place]  1. Left ventricular ejection fraction, by estimation, is 55 to 60%. The left ventricle has normal function. The left ventricle has no regional wall motion  abnormalities. There is mild left ventricular hypertrophy of the basal-septal segment. Left ventricular diastolic parameters were normal.  2. Right ventricular systolic function is normal. The right ventricular size is normal.  3. The mitral valve is normal in structure. No evidence of mitral valve regurgitation. No evidence of mitral stenosis.  4. The aortic valve is tricuspid. Aortic valve regurgitation is not visualized. No aortic stenosis is present.  5. The inferior vena cava is normal in size with greater than 50% respiratory variability, suggesting right atrial pressure of 3 mmHg. FINDINGS  Left Ventricle: Left ventricular ejection fraction, by estimation, is 55 to 60%. The left ventricle has normal function. The left ventricle has no regional wall motion abnormalities. The left ventricular internal cavity size was normal in size. There is  mild left ventricular hypertrophy of the basal-septal segment. Left ventricular diastolic parameters were normal. Right Ventricle: The right ventricular size is normal.Right ventricular systolic function is normal. Left Atrium: Left atrial size was normal in size. Right Atrium: Right atrial size was normal in size. Pericardium: Trivial pericardial effusion is present. Mitral Valve: The mitral valve is normal in structure. No evidence of mitral valve regurgitation. No evidence of mitral valve stenosis. Tricuspid Valve: The tricuspid valve is normal in structure. Tricuspid valve regurgitation is trivial. No evidence of tricuspid stenosis. Aortic Valve: The aortic valve is tricuspid. Aortic valve regurgitation is not visualized. No aortic stenosis is present. Pulmonic Valve: The pulmonic valve was normal in structure. Pulmonic valve regurgitation is trivial. No evidence of pulmonic stenosis. Aorta: The aortic root is normal in size and structure. Venous: The inferior vena cava is normal in size with greater than 50% respiratory variability, suggesting right atrial pressure of  3 mmHg. IAS/Shunts: No atrial level shunt detected by color flow Doppler.  LEFT VENTRICLE PLAX 2D LVIDd:         4.00 cm  Diastology LVIDs:         2.60 cm  LV e' medial:    8.27 cm/s LV PW:         1.00 cm  LV E/e' medial:  6.0 LV IVS:        1.20 cm  LV e' lateral:   10.30 cm/s LVOT diam:     2.40 cm  LV E/e' lateral: 4.8 LV SV:         95 LV SV Index:   49 LVOT Area:     4.52 cm  RIGHT VENTRICLE RV S prime:     11.40 cm/s TAPSE (M-mode): 2.4 cm LEFT ATRIUM             Index       RIGHT ATRIUM           Index LA diam:        3.30 cm 1.72 cm/m  RA Area:     18.20 cm LA Vol (A2C):   73.3 ml 38.20 ml/m RA Volume:   52.10 ml  27.15 ml/m LA Vol (A4C):   27.2 ml 14.17 ml/m LA Biplane Vol: 46.6 ml 24.28 ml/m  AORTIC VALVE LVOT Vmax:   92.00 cm/s LVOT Vmean:  62.400 cm/s LVOT VTI:    0.209 m  AORTA Ao Root diam: 3.30 cm MITRAL VALVE MV Area (PHT): 1.77 cm    SHUNTS MV Decel Time: 428 msec    Systemic VTI:  0.21 m MV E velocity: 49.30 cm/s  Systemic Diam: 2.40 cm MV A velocity: 73.70 cm/s MV E/A ratio:  0.67 Kirk Ruths MD Electronically signed by Kirk Ruths MD Signature Date/Time: 01/17/2021/10:18:45 AM    Final    DG Hip Unilat W or Wo Pelvis 2-3 Views Left  Result Date: 01/16/2021 CLINICAL DATA:  Recent fall with left hip pain, initial encounter EXAM: DG HIP (WITH OR WITHOUT PELVIS) 2-3V LEFT COMPARISON:  None. FINDINGS: Pelvic ring is intact. Mild degenerative changes of left hip joint are noted. No acute fracture or dislocation is seen. No soft tissue abnormality is noted. Degenerative changes in the lower lumbar spine are noted. IMPRESSION: Degenerative change without acute abnormality. Electronically Signed   By: Inez Catalina M.D.   On: 01/16/2021 09:27   (Echo, Carotid, EGD, Colonoscopy, ERCP)    Subjective: Patient seen and examined.  Since morning he has been asking for discharge.  Walking around in the hallway with physical therapy.  Denies any complaints. Telemetry with no events.  Sinus  rhythm and lowest heart rate was 49.   Discharge Exam: Vitals:   01/16/21 2224 01/17/21 0635  BP: (!) 154/76 109/71  Pulse: (!) 53 (!) 55  Resp: 18   Temp: 98.3 F (36.8 C) 98.1 F (36.7 C)  SpO2: 96% 98%   Vitals:   01/16/21 2130 01/16/21 2218 01/16/21 2224 01/17/21 0635  BP: (!) 168/84  (!) 154/76 109/71  Pulse: (!) 50  (!) 53 (!) 55  Resp:   18   Temp:   98.3 F (36.8 C) 98.1 F (36.7 C)  TempSrc:   Oral Oral  SpO2: 97%  96% 98%  Weight:  68.7 kg  67.7 kg  Height:  6' 2.02" (1.88 m)      General: Pt is alert, awake, not in acute distress Cardiovascular: RRR, S1/S2 +, no rubs, no gallops Respiratory: CTA bilaterally, no wheezing, no rhonchi Abdominal: Soft, NT, ND, bowel sounds + Extremities: no edema, no cyanosis    The results of significant diagnostics from this hospitalization (including imaging, microbiology, ancillary and laboratory) are listed below for reference.     Microbiology: Recent Results (from the past 240 hour(s))  Resp Panel by RT-PCR (Flu A&B, Covid) Nasopharyngeal Swab     Status: None   Collection Time: 01/16/21  1:39 PM   Specimen: Nasopharyngeal Swab; Nasopharyngeal(NP) swabs in vial transport medium  Result Value Ref Range Status   SARS Coronavirus 2 by RT PCR NEGATIVE NEGATIVE Final    Comment: (NOTE) SARS-CoV-2 target nucleic acids are NOT DETECTED.  The SARS-CoV-2 RNA is generally detectable in upper respiratory specimens during the acute phase of infection. The lowest concentration of SARS-CoV-2 viral copies this assay can detect is 138 copies/mL. A negative result does not preclude SARS-Cov-2 infection and should not be used as the sole basis for treatment or other patient management decisions. A negative result may occur with  improper specimen collection/handling, submission of specimen other than nasopharyngeal swab, presence of viral mutation(s) within the areas targeted by this assay, and inadequate number of  viral copies(<138 copies/mL). A negative result must be combined with clinical observations, patient history, and epidemiological information. The expected result is Negative.  Fact Sheet for Patients:  EntrepreneurPulse.com.au  Fact Sheet for Healthcare Providers:  IncredibleEmployment.be  This test is no t yet approved or cleared by the [place] FDA and  has been authorized for detection and/or diagnosis of SARS-CoV-2 by FDA under an Emergency Use Authorization (EUA). This EUA will remain  in effect (meaning this test can be used) for the duration of the COVID-19 declaration under Section 564(b)(1) of the Act, 21 U.S.C.section 360bbb-3(b)(1), unless the authorization is terminated  or revoked sooner.       Influenza A by PCR NEGATIVE NEGATIVE Final   Influenza B by PCR NEGATIVE NEGATIVE Final    Comment: (NOTE) The Xpert Xpress SARS-CoV-2/FLU/RSV plus assay is intended as an aid in the diagnosis of influenza from Nasopharyngeal swab specimens and should not be used as a sole basis for treatment. Nasal washings and aspirates are unacceptable for Xpert Xpress SARS-CoV-2/FLU/RSV testing.  Fact Sheet for Patients: EntrepreneurPulse.com.au  Fact Sheet for Healthcare Providers: IncredibleEmployment.be  This test is not yet approved or cleared by the [place] FDA and has been authorized for detection and/or diagnosis of SARS-CoV-2 by FDA under an Emergency Use Authorization (EUA). This EUA will remain in effect (meaning this test  can be used) for the duration of the COVID-19 declaration under Section 564(b)(1) of the Act, 21 U.S.C. section 360bbb-3(b)(1), unless the authorization is terminated or revoked.  Performed at [facility], [place] [address], [place], [place] [address]      Labs: BNP (last 3 results) No results for input(s): BNP in the last 8760 hours. Basic  Metabolic Panel: Recent Labs  Lab 01/16/21 0959 01/17/21 0336  NA 143 142  K 3.0* 3.5  CL 105 107  CO2 27 29  GLUCOSE 148* 125*  BUN 31* 26*  CREATININE 1.56* 1.29*  CALCIUM 9.4 9.1  MG 2.2  --   PHOS 2.8  --    Liver Function Tests: Recent Labs  Lab 01/16/21 0959 01/17/21 0336  AST 19 23  ALT 18 18  ALKPHOS 44 45  BILITOT 1.1 1.8*  PROT 6.5 6.5  ALBUMIN 3.7 3.7   No results for input(s): LIPASE, AMYLASE in the last 168 hours. No results for input(s): AMMONIA in the last 168 hours. CBC: Recent Labs  Lab 01/16/21 0959 01/17/21 0336  WBC 13.7* 10.2  NEUTROABS 11.5*  --   HGB 13.8 12.7*  HCT 40.2 35.8*  MCV 91.6 90.2  PLT 142* 133*   Cardiac Enzymes: No results for input(s): CKTOTAL, CKMB, CKMBINDEX, TROPONINI in the last 168 hours. BNP: Invalid input(s): POCBNP CBG: No results for input(s): GLUCAP in the last 168 hours. D-Dimer Recent Labs    01/16/21 0959  DDIMER 12.54*   Hgb A1c Recent Labs    01/16/21 0959  HGBA1C 5.1   Lipid Profile No results for input(s): CHOL, HDL, LDLCALC, TRIG, CHOLHDL, LDLDIRECT in the last 72 hours. Thyroid function studies No results for input(s): TSH, T4TOTAL, T3FREE, THYROIDAB in the last 72 hours.  Invalid input(s): FREET3 Anemia work up No results for input(s): VITAMINB12, FOLATE, FERRITIN, TIBC, IRON, RETICCTPCT in the last 72 hours. Urinalysis    Component Value Date/Time   COLORURINE YELLOW 01/16/2021 1330   APPEARANCEUR CLEAR 01/16/2021 1330   LABSPEC 1.024 01/16/2021 1330   PHURINE 5.0 01/16/2021 1330   GLUCOSEU NEGATIVE 01/16/2021 1330   HGBUR NEGATIVE 01/16/2021 1330   BILIRUBINUR NEGATIVE 01/16/2021 1330   KETONESUR NEGATIVE 01/16/2021 1330   PROTEINUR NEGATIVE 01/16/2021 1330   UROBILINOGEN 0.2 08/23/2012 2225   NITRITE NEGATIVE 01/16/2021 1330   LEUKOCYTESUR NEGATIVE 01/16/2021 1330   Sepsis Labs Invalid input(s): PROCALCITONIN,  WBC,  LACTICIDVEN Microbiology Recent Results (from the past  240 hour(s))  Resp Panel by RT-PCR (Flu A&B, Covid) Nasopharyngeal Swab     Status: None   Collection Time: 01/16/21  1:39 PM   Specimen: Nasopharyngeal Swab; Nasopharyngeal(NP) swabs in vial transport medium  Result Value Ref Range Status   SARS Coronavirus 2 by RT PCR NEGATIVE NEGATIVE Final    Comment: (NOTE) SARS-CoV-2 target nucleic acids are NOT DETECTED.  The SARS-CoV-2 RNA is generally detectable in upper respiratory specimens during the acute phase of infection. The lowest concentration of SARS-CoV-2 viral copies this assay can detect is 138 copies/mL. A negative result does not preclude SARS-Cov-2 infection and should not be used as the sole basis for treatment or other patient management decisions. A negative result may occur with  improper specimen collection/handling, submission of specimen other than nasopharyngeal swab, presence of viral mutation(s) within the areas targeted by this assay, and inadequate number of viral copies(<138 copies/mL). A negative result must be combined with clinical observations, patient history, and epidemiological information. The expected result is Negative.  Fact  Sheet for Patients:  EntrepreneurPulse.com.au  Fact Sheet for Healthcare Providers:  IncredibleEmployment.be  This test is no t yet approved or cleared by the [place] FDA and  has been authorized for detection and/or diagnosis of SARS-CoV-2 by FDA under an Emergency Use Authorization (EUA). This EUA will remain  in effect (meaning this test can be used) for the duration of the COVID-19 declaration under Section 564(b)(1) of the Act, 21 U.S.C.section 360bbb-3(b)(1), unless the authorization is terminated  or revoked sooner.       Influenza A by PCR NEGATIVE NEGATIVE Final   Influenza B by PCR NEGATIVE NEGATIVE Final    Comment: (NOTE) The Xpert Xpress SARS-CoV-2/FLU/RSV plus assay is intended as an aid in the diagnosis of influenza  from Nasopharyngeal swab specimens and should not be used as a sole basis for treatment. Nasal washings and aspirates are unacceptable for Xpert Xpress SARS-CoV-2/FLU/RSV testing.  Fact Sheet for Patients: EntrepreneurPulse.com.au  Fact Sheet for Healthcare Providers: IncredibleEmployment.be  This test is not yet approved or cleared by the [place] FDA and has been authorized for detection and/or diagnosis of SARS-CoV-2 by FDA under an Emergency Use Authorization (EUA). This EUA will remain in effect (meaning this test can be used) for the duration of the COVID-19 declaration under Section 564(b)(1) of the Act, 21 U.S.C. section 360bbb-3(b)(1), unless the authorization is terminated or revoked.  Performed at [facility], [place] [address], [place], [place] [address]      Time coordinating discharge:  32 minutes  SIGNED:   Barb Merino, MD  [facility] 01/17/2021, 11:24 AM

## 2021-01-17 NOTE — Care Management CC44 (Signed)
Condition Code 44 Documentation Completed  Patient Details  Name: Alan [name] MRN: [id number] Date of Birth: 12-20-1940   Condition Code 44 given:  Yes Patient signature on Condition Code 44 notice:  Yes Documentation of 2 MD's agreement:  Yes Code 44 added to claim:  Yes    Kiosha Buchan, LCSW 01/17/2021, 2:00 PM

## 2021-01-17 NOTE — Progress Notes (Signed)
Echocardiogram 2D Echocardiogram has been performed.  Oneal Deputy Winter Jocelyn RDCS 01/17/2021, 8:40 AM

## 2021-01-17 NOTE — Evaluation (Signed)
 Occupational Therapy Evaluation/Discharge Patient Details Name: Alan [name] MRN: [id number] DOB: [birth year] Today's Date: 01/17/2021    History of Present Illness Pt is 80 y.o. male who presented to ED with weakness,near syncopal episode, and SOB found to have acute pulmonary emboli involving Rt middle lobe . PMH significant for malignant carcinoid tumor, HTN, BPH.   Clinical Impression   PTA, pt lives with spouse and reports Independence with ADLs, IADLs in the home and mobility. Pt presents now with minor deficits in pain and standing balance. With use of RW, improved stability noted with pt reporting going to bathroom in room without assistance. Educated on energy conservation and fall prevention strategies with pt implementing many techniques already. Recommend RW for use at home with no further skilled OT services needed at this time.     Follow Up Recommendations  No OT follow up    Equipment Recommendations  Other (comment) (Rolling walker)    Recommendations for Other Services       Precautions / Restrictions Precautions Precautions: Fall Restrictions Weight Bearing Restrictions: No      Mobility Bed Mobility               General bed mobility comments: Pt OOB in recliner    Transfers Overall transfer level: Needs assistance Equipment used: Rolling walker (2 wheeled) Transfers: Sit to/from Stand Sit to Stand: Modified independent (Device/Increase time)         General transfer comment: supervision for safety. Pt demonstrated safe hand placememnt    Balance Overall balance assessment: Needs assistance Sitting-balance support: Feet supported Sitting balance-Leahy Scale: Good     Standing balance support: Bilateral upper extremity supported Standing balance-Leahy Scale: Poor Standing balance comment: use of RW to maintain standing balance                           ADL either performed or assessed with clinical judgement   ADL Overall  ADL's : Modified independent                                       General ADL Comments: Able to reach to feet for LB dressing, mobilize with walker in room. Pt reports going to bathroom without assist     Vision Baseline Vision/History: Wears glasses Patient Visual Report: No change from baseline Vision Assessment?: No apparent visual deficits     Perception     Praxis      Pertinent Vitals/Pain Pain Assessment: Faces Pain Score: 6  Faces Pain Scale: Hurts little more Pain Location: R great toe Pain Descriptors / Indicators: Sore Pain Intervention(s): Limited activity within patient's tolerance;Monitored during session     Hand Dominance Right   Extremity/Trunk Assessment Upper Extremity Assessment Upper Extremity Assessment: Overall WFL for tasks assessed   Lower Extremity Assessment Lower Extremity Assessment: Defer to PT evaluation   Cervical / Trunk Assessment Cervical / Trunk Assessment: Kyphotic   Communication Communication Communication: No difficulties   Cognition Arousal/Alertness: Awake/alert Behavior During Therapy: WFL for tasks assessed/performed;Anxious Overall Cognitive Status: Within Functional Limits for tasks assessed                                 General Comments: pleasant though anxious to discharge home asap   General Comments  VSS on RA.  Exercises     Shoulder Instructions      Home Living Family/patient expects to be discharged to:: Private residence Living Arrangements: Spouse/significant other Available Help at Discharge: Family Type of Home: House Home Access: Stairs to enter Technical  of Steps: 5 Entrance Stairs-Rails: Can reach both [place]: One level     Bathroom Shower/Tub: Occupational psychologist: Handicapped height Bathroom Accessibility: Yes   Home Equipment: [place] - single point;Shower seat - built in;Grab bars - toilet;Hand held shower head;Bedside  commode;Grab bars - tub/shower   Additional Comments: Pt reports (I) with all ADLs and mobility. He lives with his wife and son lives close and can provide intermittent assist if needed.      Prior Functioning/Environment Level of Independence: Independent        Comments: Independent with ADLs, shares IADLs with wife (pt does laundry).        OT Problem List:        OT Treatment/Interventions:      OT Goals(Current goals can be found in the care plan section) Acute Rehab OT Goals Patient Stated Goal: go home before the weekend OT Goal Formulation: All assessment and education complete, DC therapy  OT Frequency:     Barriers to D/C:            Co-evaluation              AM-PAC OT "6 Clicks" Daily Activity     Outcome Measure Help from another person eating meals?: None Help from another person taking care of personal grooming?: None Help from another person toileting, which includes using toliet, bedpan, or urinal?: None Help from another person bathing (including washing, rinsing, drying)?: None Help from another person to put on and taking off regular upper body clothing?: None Help from another person to put on and taking off regular lower body clothing?: None 6 Click Score: 24   End of Session Equipment Utilized During Treatment: Rolling walker Nurse Communication: Mobility status  Activity Tolerance: Patient tolerated treatment well Patient left: in chair;with call bell/phone within reach  OT Visit Diagnosis: Unsteadiness on feet (R26.81)                Time: 0109-3235 OT Time Calculation (min): 11 min Charges:  OT General Charges $OT Visit: 1 Visit OT Evaluation $OT Eval Low Complexity: 1 Low  Malachy Chamber, OTR/L Acute Rehab Services Office: [phone]   Layla Maw 01/17/2021, 11:37 AM

## 2021-01-17 NOTE — Care Management Obs Status (Signed)
[facility] NOTIFICATION   Patient Details  Name: Alan [name] MRN: [id number] Date of Birth: [birth year]   Medicare Observation Status Notification Given:  Yes    Lennart Pall, [place] 01/17/2021, 2:00 PM

## 2021-01-17 NOTE — Progress Notes (Signed)
Josie Saunders [place] to be D/C'd home with wife per MD order. Discussed with the patient and wife and all questions fully answered.  Skin clean, dry and intact without evidence of skin break down, no evidence of skin tears noted.  IV catheter discontinued intact. Site without signs and symptoms of complications. Dressing and pressure applied.  An After Visit Summary was printed and given to the patient. Walker delivered to patient. Patient escorted via [place], and D/C home via private auto.  Melonie [place]  01/17/2021

## 2021-01-17 NOTE — TOC Transition Note (Signed)
Transition of Care [facility]) - CM/SW Discharge Note   Patient Details  Name: Alan [name] MRN: [id number] Date of Birth: 1941/06/27  Transition of Care [facility] CM/SW Contact:  Lennart Pall, LCSW Phone Number: 01/17/2021, 1:52 PM   Clinical Narrative:     Pt medically cleared for dc today.  Orders placed for OPPT and referral sent to [facility] ordered for deliver to pt hospital room.  No further TOC needs.  Final next level of care: OP Rehab Barriers to Discharge: Barriers Resolved   Patient Goals and CMS Choice Patient states their goals for this hospitalization and ongoing recovery are:: return home      Discharge Placement                       Discharge Plan and Services                DME Arranged: Walker rolling DME Agency: [employer] Date DME Agency Contacted: 01/17/21 Time DME Agency Contacted: 48 Representative spoke with at DME Agency: [place] ([place]) Interventions     Readmission Risk Interventions No flowsheet data found.

## 2021-01-17 NOTE — Evaluation (Signed)
Physical Therapy Evaluation Patient Details Name: Alan [name] MRN: [id number] DOB: 02/25/1941 Today's Date: 01/17/2021   History of Present Illness  Pt is 80 y.o. male who presented to ED with weakness,near syncopal episode, and SOB found to have acute pulmonary emboli involving Rt middle lobe . PMH significant for malignant carcinoid tumor, HTN, BPH.  Clinical Impression  Pt is a 80 y.o. male with above HPI resulting in the deficits listed below (see PT Problem List). Pt reports that he is independent with mobility and no assistive device at baseline. Pt ambulated 160ft with MIN guard-supervision and cues for RW management, no LOB observed.  Pt is currently below baseline mobility and will benefit from continued skilled PT to increase their independence and maximize safety with mobility.      Follow Up Recommendations Outpatient PT    Equipment Recommendations  Rolling walker with 5" wheels    Recommendations for Other Services       Precautions / Restrictions Precautions Precautions: Fall Restrictions Weight Bearing Restrictions: No      Mobility  Bed Mobility               General bed mobility comments: Pt OOB in recliner    Transfers Overall transfer level: Needs assistance Equipment used: Rolling walker (2 wheeled) Transfers: Sit to/from Stand Sit to Stand: Supervision         General transfer comment: supervision for safety. Pt demonstrated safe hand placememnt  Ambulation/Gait Ambulation/Gait assistance: Min guard;Supervision Gait Distance (Feet): 160 Feet Assistive device: Rolling walker (2 wheeled) Gait Pattern/deviations: Step-through pattern Gait velocity: decr   General Gait Details: MIN guard progressing to supervision for safety with cues to maintain safe proximity to RW and for upright posture, no LOB observed. Pt reported feeling more stable with use of RW as his R toe and knee have been bothering him and he has noticed more difficulty with  mobility compared to baseline.  Stairs            Wheelchair Mobility    Modified Rankin (Stroke Patients Only)       Balance Overall balance assessment: Needs assistance Sitting-balance support: Feet supported Sitting balance-Leahy Scale: Good     Standing balance support: Bilateral upper extremity supported Standing balance-Leahy Scale: Poor Standing balance comment: use of RW to maintain standing balance                             Pertinent Vitals/Pain Pain Assessment: 0-10 Pain Score: 6  Pain Location: R great toe Pain Descriptors / Indicators: Aching;Sore;Tender    Home Living Family/patient expects to be discharged to:: Private residence Living Arrangements: Spouse/significant other Available Help at Discharge: Family Type of Home: House Home Access: Stairs to enter Entrance Stairs-Rails: Can reach both Entrance Stairs-Number of Steps: 5 Home Layout: One level Home Equipment: Cane - single point;Shower seat - built in;Grab bars - toilet;Hand held shower head;Bedside commode Additional Comments: Pt reports (I) with all ADLs and mobility. He lives with his wife and son lives close and can provide intermittent assist if needed.    Prior Function Level of Independence: Independent               Hand Dominance   Dominant Hand: Right    Extremity/Trunk Assessment   Upper Extremity Assessment Upper Extremity Assessment: Defer to OT evaluation    Lower Extremity Assessment Lower Extremity Assessment: Generalized weakness    Cervical / Trunk Assessment Cervical /  Trunk Assessment: Kyphotic  Communication   Communication: No difficulties  Cognition Arousal/Alertness: Awake/alert Behavior During Therapy: WFL for tasks assessed/performed Overall Cognitive Status: Within Functional Limits for tasks assessed                                        General Comments      Exercises     Assessment/Plan    PT  Assessment Patient needs continued PT services  PT Problem List Decreased strength;Decreased activity tolerance;Decreased balance;Decreased mobility;Decreased knowledge of use of DME;Pain       PT Treatment Interventions DME instruction;Gait training;Stair training;Functional mobility training;Therapeutic activities;Therapeutic exercise;Patient/family education    PT Goals (Current goals can be found in the Care Plan section)  Acute Rehab PT Goals Patient Stated Goal: go home before the weekend PT Goal Formulation: With patient Time For Goal Achievement: 01/31/21 Potential to Achieve Goals: Good    Frequency Min 3X/week   Barriers to discharge        Co-evaluation               AM-PAC PT "6 Clicks" Mobility  Outcome Measure Help needed turning from your back to your side while in a flat bed without using bedrails?: A Little Help needed moving from lying on your back to sitting on the side of a flat bed without using bedrails?: A Little Help needed moving to and from a bed to a chair (including a wheelchair)?: A Little Help needed standing up from a chair using your arms (e.g., wheelchair or bedside chair)?: A Little Help needed to walk in hospital room?: A Little Help needed climbing 3-5 steps with a railing? : A Lot 6 Click Score: 17    End of Session Equipment Utilized During Treatment: Gait belt Activity Tolerance: Patient tolerated treatment well Patient left: in chair Nurse Communication: Mobility status PT Visit Diagnosis: Unsteadiness on feet (R26.81);Muscle weakness (generalized) (M62.81);Pain Pain - Right/Left: Right Pain - part of body: Ankle and joints of foot    Time: 3568-6168 PT Time Calculation (min) (ACUTE ONLY): 21 min   Charges:   PT Evaluation $PT Eval Low Complexity: 1 Low          Festus Barren PT, DPT  Acute Rehabilitation Services  Office [phone]   01/17/2021, 9:58 AM

## 2021-01-18 ENCOUNTER — Other Ambulatory Visit: Payer: Self-pay

## 2021-01-18 ENCOUNTER — Encounter (HOSPITAL_COMMUNITY): Payer: Self-pay | Admitting: Internal Medicine

## 2021-01-18 ENCOUNTER — Inpatient Hospital Stay (HOSPITAL_COMMUNITY)
Admission: EM | Admit: 2021-01-18 | Discharge: 2021-01-22 | DRG: 312 | Disposition: A | Discharge status: Home Health | Payer: Medicare Other | Attending: Internal Medicine | Admitting: Internal Medicine

## 2021-01-18 DIAGNOSIS — I2699 Other pulmonary embolism without acute cor pulmonale: Secondary | ICD-10-CM | POA: Diagnosis not present

## 2021-01-18 DIAGNOSIS — Z7901 Long term (current) use of anticoagulants: Secondary | ICD-10-CM

## 2021-01-18 DIAGNOSIS — Z79899 Other long term (current) drug therapy: Secondary | ICD-10-CM | POA: Diagnosis not present

## 2021-01-18 DIAGNOSIS — C7B02 Secondary carcinoid tumors of liver: Secondary | ICD-10-CM | POA: Diagnosis present

## 2021-01-18 DIAGNOSIS — D696 Thrombocytopenia, unspecified: Secondary | ICD-10-CM | POA: Diagnosis present

## 2021-01-18 DIAGNOSIS — E876 Hypokalemia: Secondary | ICD-10-CM | POA: Diagnosis present

## 2021-01-18 DIAGNOSIS — Z87891 Personal history of nicotine dependence: Secondary | ICD-10-CM | POA: Diagnosis not present

## 2021-01-18 DIAGNOSIS — E861 Hypovolemia: Secondary | ICD-10-CM | POA: Diagnosis present

## 2021-01-18 DIAGNOSIS — R195 Other fecal abnormalities: Secondary | ICD-10-CM | POA: Diagnosis not present

## 2021-01-18 DIAGNOSIS — Z7951 Long term (current) use of inhaled steroids: Secondary | ICD-10-CM | POA: Diagnosis not present

## 2021-01-18 DIAGNOSIS — N1831 Chronic kidney disease, stage 3a: Secondary | ICD-10-CM | POA: Diagnosis present

## 2021-01-18 DIAGNOSIS — I2510 Atherosclerotic heart disease of native coronary artery without angina pectoris: Secondary | ICD-10-CM | POA: Diagnosis present

## 2021-01-18 DIAGNOSIS — Z820 Family history of epilepsy and other diseases of the nervous system: Secondary | ICD-10-CM

## 2021-01-18 DIAGNOSIS — Z85060 Personal history of malignant carcinoid tumor of small intestine: Secondary | ICD-10-CM | POA: Diagnosis not present

## 2021-01-18 DIAGNOSIS — I10 Essential (primary) hypertension: Secondary | ICD-10-CM | POA: Diagnosis present

## 2021-01-18 DIAGNOSIS — K922 Gastrointestinal hemorrhage, unspecified: Secondary | ICD-10-CM | POA: Diagnosis present

## 2021-01-18 DIAGNOSIS — R55 Syncope and collapse: Secondary | ICD-10-CM | POA: Diagnosis present

## 2021-01-18 DIAGNOSIS — Z9109 Other allergy status, other than to drugs and biological substances: Secondary | ICD-10-CM | POA: Diagnosis not present

## 2021-01-18 DIAGNOSIS — D62 Acute posthemorrhagic anemia: Secondary | ICD-10-CM | POA: Diagnosis present

## 2021-01-18 DIAGNOSIS — R001 Bradycardia, unspecified: Secondary | ICD-10-CM | POA: Diagnosis not present

## 2021-01-18 DIAGNOSIS — J45909 Unspecified asthma, uncomplicated: Secondary | ICD-10-CM | POA: Diagnosis present

## 2021-01-18 DIAGNOSIS — I951 Orthostatic hypotension: Principal | ICD-10-CM | POA: Diagnosis present

## 2021-01-18 DIAGNOSIS — Z20822 Contact with and (suspected) exposure to covid-19: Secondary | ICD-10-CM | POA: Diagnosis present

## 2021-01-18 DIAGNOSIS — Z880 Allergy status to penicillin: Secondary | ICD-10-CM | POA: Diagnosis not present

## 2021-01-18 DIAGNOSIS — K529 Noninfective gastroenteritis and colitis, unspecified: Secondary | ICD-10-CM | POA: Diagnosis not present

## 2021-01-18 DIAGNOSIS — K912 Postsurgical malabsorption, not elsewhere classified: Secondary | ICD-10-CM | POA: Diagnosis present

## 2021-01-18 DIAGNOSIS — Z9049 Acquired absence of other specified parts of digestive tract: Secondary | ICD-10-CM

## 2021-01-18 DIAGNOSIS — R739 Hyperglycemia, unspecified: Secondary | ICD-10-CM | POA: Diagnosis present

## 2021-01-18 DIAGNOSIS — N183 Chronic kidney disease, stage 3 unspecified: Secondary | ICD-10-CM | POA: Diagnosis present

## 2021-01-18 DIAGNOSIS — C7A019 Malignant carcinoid tumor of the small intestine, unspecified portion: Secondary | ICD-10-CM | POA: Diagnosis not present

## 2021-01-18 DIAGNOSIS — N400 Enlarged prostate without lower urinary tract symptoms: Secondary | ICD-10-CM | POA: Diagnosis present

## 2021-01-18 DIAGNOSIS — Z23 Encounter for immunization: Secondary | ICD-10-CM

## 2021-01-18 DIAGNOSIS — Z888 Allergy status to other drugs, medicaments and biological substances status: Secondary | ICD-10-CM

## 2021-01-18 DIAGNOSIS — E860 Dehydration: Secondary | ICD-10-CM | POA: Diagnosis present

## 2021-01-18 DIAGNOSIS — M25561 Pain in right knee: Secondary | ICD-10-CM | POA: Diagnosis present

## 2021-01-18 HISTORY — DX: Postsurgical malabsorption, not elsewhere classified: K91.2

## 2021-01-18 HISTORY — DX: Essential (primary) hypertension: I10

## 2021-01-18 HISTORY — DX: Malignant carcinoid tumor of the small intestine, unspecified portion: C7A.019

## 2021-01-18 HISTORY — DX: Unspecified asthma, uncomplicated: J45.909

## 2021-01-18 HISTORY — DX: Chronic kidney disease, stage 3 unspecified: N18.30

## 2021-01-18 HISTORY — DX: Short bowel syndrome, unspecified: K90.829

## 2021-01-18 HISTORY — DX: Noninfective gastroenteritis and colitis, unspecified: K52.9

## 2021-01-18 LAB — BASIC METABOLIC PANEL
Anion gap: 5 (ref 5–15)
BUN: 25 mg/dL — ABNORMAL HIGH (ref 8–23)
CO2: 25 mmol/L (ref 22–32)
Calcium: 7.5 mg/dL — ABNORMAL LOW (ref 8.9–10.3)
Chloride: 115 mmol/L — ABNORMAL HIGH (ref 98–111)
Creatinine, Ser: 1.22 mg/dL (ref 0.61–1.24)
GFR, Estimated: >60 mL/min (ref >60)
Glucose, Bld: 134 mg/dL — ABNORMAL HIGH (ref 70–99)
Potassium: 3.1 mmol/L — ABNORMAL LOW (ref 3.5–5.1)
Sodium: 145 mmol/L (ref 135–145)

## 2021-01-18 LAB — CBC WITH DIFFERENTIAL/PLATELET
Abs Immature Granulocytes: 0.04 10*3/uL (ref 0.00–0.07)
Basophils Absolute: 0 10*3/uL (ref 0.0–0.1)
Basophils Relative: 0 %
Eosinophils Absolute: 0.1 10*3/uL (ref 0.0–0.5)
Eosinophils Relative: 1 %
HCT: 32.6 % — ABNORMAL LOW (ref 39.0–52.0)
Hemoglobin: 11.6 g/dL — ABNORMAL LOW (ref 13.0–17.0)
Immature Granulocytes: 0 %
Lymphocytes Relative: 14 %
Lymphs Abs: 1.3 10*3/uL (ref 0.7–4.0)
MCH: 32 pg (ref 26.0–34.0)
MCHC: 35.6 g/dL (ref 30.0–36.0)
MCV: 90.1 fL (ref 80.0–100.0)
Monocytes Absolute: 0.8 10*3/uL (ref 0.1–1.0)
Monocytes Relative: 9 %
Neutro Abs: 7.1 10*3/uL (ref 1.7–7.7)
Neutrophils Relative %: 76 %
Platelets: 127 10*3/uL — ABNORMAL LOW (ref 150–400)
RBC: 3.62 MIL/uL — ABNORMAL LOW (ref 4.22–5.81)
RDW: 13.2 % (ref 11.5–15.5)
WBC: 9.4 10*3/uL (ref 4.0–10.5)
nRBC: 0 % (ref 0.0–0.2)

## 2021-01-18 LAB — CBG MONITORING, ED: Glucose-Capillary: 157 mg/dL — ABNORMAL HIGH (ref 70–99)

## 2021-01-18 LAB — TROPONIN I (HIGH SENSITIVITY)
Troponin I (High Sensitivity): 8 ng/L (ref <18)
Troponin I (High Sensitivity): 7 ng/L (ref <18)

## 2021-01-18 LAB — POC OCCULT BLOOD, ED: Fecal Occult Bld: POSITIVE — AB

## 2021-01-18 MED ORDER — POTASSIUM CHLORIDE CRYS ER 20 MEQ PO TBCR
40.0000 meq | EXTENDED_RELEASE_TABLET | Freq: Once | ORAL | Status: AC
  Administered 2021-01-18: 40 meq via ORAL
  Filled 2021-01-18: qty 2

## 2021-01-18 MED ORDER — SODIUM CHLORIDE 0.9% FLUSH
3.0000 mL | Freq: Two times a day (BID) | INTRAVENOUS | Status: DC
  Administered 2021-01-19 – 2021-01-20 (×4): 3 mL via INTRAVENOUS

## 2021-01-18 MED ORDER — ONDANSETRON HCL 4 MG PO TABS: 4.0000 mg | ORAL_TABLET | Freq: Four times a day (QID) | ORAL | Status: DC | PRN

## 2021-01-18 MED ORDER — ONDANSETRON HCL 4 MG/2ML IJ SOLN: 4.0000 mg | Freq: Four times a day (QID) | INTRAMUSCULAR | Status: DC | PRN

## 2021-01-18 MED ORDER — ACETAMINOPHEN 325 MG PO TABS: 650.0000 mg | ORAL_TABLET | Freq: Four times a day (QID) | ORAL | Status: DC | PRN

## 2021-01-18 MED ORDER — ADULT MULTIVITAMIN W/MINERALS CH
1.0000 | ORAL_TABLET | Freq: Every day | ORAL | Status: DC
  Administered 2021-01-20 – 2021-01-22 (×3): 1 via ORAL
  Filled 2021-01-18 (×4): qty 1

## 2021-01-18 MED ORDER — ACETAMINOPHEN 650 MG RE SUPP: 650.0000 mg | Freq: Four times a day (QID) | RECTAL | Status: DC | PRN

## 2021-01-18 MED ORDER — LOPERAMIDE HCL 2 MG PO CAPS: 2.0000 mg | ORAL_CAPSULE | ORAL | Status: DC | PRN

## 2021-01-18 MED ORDER — ALBUTEROL SULFATE (2.5 MG/3ML) 0.083% IN NEBU: 3.0000 mL | INHALATION_SOLUTION | RESPIRATORY_TRACT | Status: DC | PRN

## 2021-01-18 MED ORDER — MOMETASONE FURO-FORMOTEROL FUM 200-5 MCG/ACT IN AERO
2.0000 | INHALATION_SPRAY | Freq: Two times a day (BID) | RESPIRATORY_TRACT | Status: DC
  Administered 2021-01-20 – 2021-01-22 (×5): 2 via RESPIRATORY_TRACT
  Filled 2021-01-18: qty 8.8

## 2021-01-18 MED ORDER — SODIUM CHLORIDE 0.9 % IV BOLUS
500.0000 mL | Freq: Once | INTRAVENOUS | Status: AC
  Administered 2021-01-18: 500 mL via INTRAVENOUS

## 2021-01-18 MED ORDER — SODIUM CHLORIDE 0.9 % IV SOLN: INTRAVENOUS | Status: DC

## 2021-01-18 NOTE — H&P (Signed)
History and Physical    Alan [name] FUX:[id number] DOB: [birth year] DOA: 01/18/2021  PCP: Alan Liberty, MD  Patient coming from: Home  I have personally briefly reviewed patient's old medical records in [place]  Chief Complaint: Near Syncope  HPI: Alan [name] is a 80 y.o. male with medical history significant of metastatic carcinoid tumor, short gut syndrome, chronic diarrhea, HTN.  Pt recently started on Lanreotide on 7/19 for chronic diarrhea in setting of metastatic carcinoid.  Pt admitted 7/21-7/22 for near syncope episodes.  Work up revealed small PE without RHS.  Pt started on eliquis.  Pt discharged.  Was felt that his near syncope episodes were possibly due to vasodilation from carcinoid.  No arrythmias on monitor that admit.  Pt has continued to have multiple near syncopal episodes today.  Returns to ED.  No fevers, chills, CP, SOB.   ED Course: HGB 11.6 down from 13.8 on 7/21.  Hemoccult positive though no frank melena nor hematochezia.  [place] 40s-50s, new since prior EKGs.  Trop neg x2.  BP 025 systolic.  K 3.1.   Review of Systems: As per HPI, otherwise all review of systems negative.  Past Medical History:  Diagnosis Date   Carcinoid tumor, small intestine, malignant (HCC)    Chronic diarrhea    CKD (chronic kidney disease) stage 3, GFR 30-59 ml/min (HCC)    HTN (hypertension)    Short gut syndrome     Past Surgical History:  Procedure Laterality Date   COLON SURGERY       reports that he quit smoking about 10 months ago. His smoking use included cigarettes. He started smoking about 62 years ago. He has a 18.30 pack-year smoking history. He has never used smokeless tobacco. He reports current alcohol use. He reports that he does not use drugs.  Allergies  Allergen Reactions   Penicillins Hives and Shortness Of Breath   Lisinopril Itching    Family History  Problem Relation Age of Onset   Alzheimer's disease Mother       Prior to Admission medications   Medication Sig Start Date End Date Taking? Authorizing Provider  albuterol (VENTOLIN HFA) 108 (90 Base) MCG/ACT inhaler Inhale 2 puffs into the lungs every 4 (four) hours as needed for shortness of breath. 07/23/20  Yes Sharion Balloon, NP  APIXABAN Arne [place]) VTE STARTER PACK (10MG  AND 5MG ) Take as directed on package: start with two-5mg  tablets twice daily for 7 days. On day 8, switch to one-5mg  tablet twice daily. Patient taking differently: Take 10 mg by mouth See admin instructions. Take as directed on package: start with two-5mg  tablets twice daily for 7 days. On day 8, switch to one-5mg  tablet twice daily. 01/17/21  Yes Barb Merino, MD  Multiple Vitamin (MULTIVITAMIN WITH MINERALS) TABS Take 1 tablet by mouth daily.   Yes [provider]  Multiple Vitamins-Minerals (PRESERVISION AREDS 2 PO) Take 1 capsule by mouth daily.   Yes [provider]  SYMBICORT 160-4.5 MCG/ACT inhaler Inhale 2 puffs into the lungs at bedtime. 10/31/20  Yes [provider]  tamsulosin (FLOMAX) 0.4 MG CAPS capsule Take 0.4 mg by mouth daily. 07/08/20  Yes [provider]  Vitamin D, Ergocalciferol, (DRISDOL) 1.25 MG (50000 UNIT) CAPS capsule Take 50,000 Units by mouth every 7 (seven) days. 07/08/20  Yes [provider]    Physical Exam: Vitals:   01/18/21 1700 01/18/21 1830 01/18/21 1900 01/18/21 1930  BP: (!) 157/64 (!) 159/70 (!) 149/117 Marland Kitchen)  166/70  Pulse: (!) 44 (!) 46 (!) 56 (!) 42  Resp: (!) 22 (!) 22 17 18   Temp:      TempSrc:      SpO2: 100% 99% 100% 99%    Constitutional: NAD, calm, comfortable Eyes: PERRL, lids and conjunctivae normal ENMT: Mucous membranes are moist. Posterior pharynx clear of any exudate or lesions.Normal dentition.  Neck: normal, supple, no masses, no thyromegaly Respiratory: clear to auscultation bilaterally, no wheezing, no crackles. Normal respiratory effort. No accessory muscle use.   Cardiovascular: Bradycardic Abdomen: no tenderness, no masses palpated. No hepatosplenomegaly. Bowel sounds positive.  Musculoskeletal: no clubbing / cyanosis. No joint deformity upper and lower extremities. Good ROM, no contractures. Normal muscle tone.  Skin: no rashes, lesions, ulcers. No induration Neurologic: CN 2-12 grossly intact. Sensation intact, DTR normal. Strength 5/5 in all 4.  Psychiatric: Normal judgment and insight. Alert and oriented x 3. Normal mood.    Labs on Admission: I have personally reviewed following labs and imaging studies  CBC: Recent Labs  Lab 01/16/21 0959 01/17/21 0336 01/18/21 1616  WBC 13.7* 10.2 9.4  NEUTROABS 11.5*  --  7.1  HGB 13.8 12.7* 11.6*  HCT 40.2 35.8* 32.6*  MCV 91.6 90.2 90.1  PLT 142* 133* 962*   Basic Metabolic Panel: Recent Labs  Lab 01/16/21 0959 01/17/21 0336 01/18/21 1616  NA 143 142 145  K 3.0* 3.5 3.1*  CL 105 107 115*  CO2 27 29 25   GLUCOSE 148* 125* 134*  BUN 31* 26* 25*  CREATININE 1.56* 1.29* 1.22  CALCIUM 9.4 9.1 7.5*  MG 2.2  --   --   PHOS 2.8  --   --    GFR: Estimated Creatinine Clearance: 47 mL/min (by C-G formula based on SCr of 1.22 mg/dL). Liver Function Tests: Recent Labs  Lab 01/16/21 0959 01/17/21 0336  AST 19 23  ALT 18 18  ALKPHOS 44 45  BILITOT 1.1 1.8*  PROT 6.5 6.5  ALBUMIN 3.7 3.7   No results for input(s): LIPASE, AMYLASE in the last 168 hours. No results for input(s): AMMONIA in the last 168 hours. Coagulation Profile: Recent Labs  Lab 01/16/21 0959  INR 1.0   Cardiac Enzymes: No results for input(s): CKTOTAL, CKMB, CKMBINDEX, TROPONINI in the last 168 hours. BNP (last 3 results) No results for input(s): PROBNP in the last 8760 hours. HbA1C: Recent Labs    01/16/21 0959  HGBA1C 5.1   CBG: Recent Labs  Lab 01/18/21 1602  GLUCAP 157*   Lipid Profile: No results for input(s): CHOL, HDL, LDLCALC, TRIG, CHOLHDL, LDLDIRECT in the last 72 hours. Thyroid Function  Tests: No results for input(s): TSH, T4TOTAL, FREET4, T3FREE, THYROIDAB in the last 72 hours. Anemia Panel: No results for input(s): VITAMINB12, FOLATE, FERRITIN, TIBC, IRON, RETICCTPCT in the last 72 hours. Urine analysis:    Component Value Date/Time   COLORURINE YELLOW 01/16/2021 1330   APPEARANCEUR CLEAR 01/16/2021 1330   LABSPEC 1.024 01/16/2021 1330   PHURINE 5.0 01/16/2021 1330   GLUCOSEU NEGATIVE 01/16/2021 1330   HGBUR NEGATIVE 01/16/2021 1330   BILIRUBINUR NEGATIVE 01/16/2021 1330   KETONESUR NEGATIVE 01/16/2021 1330   PROTEINUR NEGATIVE 01/16/2021 1330   UROBILINOGEN 0.2 08/23/2012 2225   NITRITE NEGATIVE 01/16/2021 1330   LEUKOCYTESUR NEGATIVE 01/16/2021 1330    Radiological Exams on Admission: ECHOCARDIOGRAM COMPLETE  Result Date: 01/17/2021    ECHOCARDIOGRAM REPORT   Patient Name:   [facility] Date of Exam: 01/17/2021 Medical Rec #:  [id number]  Height:       74.0 in Accession #:    [id number]      Weight:       149.2 lb Date of Birth:  03/19/1941        BSA:          1.919 m Patient Age:    75 years        BP:           109/71 mmHg Patient Gender: M               HR:           49 bpm. Exam Location:  Inpatient Procedure: 2D Echo, Color Doppler and Cardiac Doppler Indications:    R55 Syncope  History:        Patient has prior history of Echocardiogram examinations, most                 recent 01/15/2016. Prior performed at [place]:    [place] Referring Phys: [phone] [place]  1. Left ventricular ejection fraction, by estimation, is 55 to 60%. The left ventricle has normal function. The left ventricle has no regional wall motion abnormalities. There is mild left ventricular hypertrophy of the basal-septal segment. Left ventricular diastolic parameters were normal.  2. Right ventricular systolic function is normal. The right ventricular size is normal.  3. The mitral valve is normal in structure. No evidence of mitral valve  regurgitation. No evidence of mitral stenosis.  4. The aortic valve is tricuspid. Aortic valve regurgitation is not visualized. No aortic stenosis is present.  5. The inferior vena cava is normal in size with greater than 50% respiratory variability, suggesting right atrial pressure of 3 mmHg. FINDINGS  Left Ventricle: Left ventricular ejection fraction, by estimation, is 55 to 60%. The left ventricle has normal function. The left ventricle has no regional wall motion abnormalities. The left ventricular internal cavity size was normal in size. There is  mild left ventricular hypertrophy of the basal-septal segment. Left ventricular diastolic parameters were normal. Right Ventricle: The right ventricular size is normal.Right ventricular systolic function is normal. Left Atrium: Left atrial size was normal in size. Right Atrium: Right atrial size was normal in size. Pericardium: Trivial pericardial effusion is present. Mitral Valve: The mitral valve is normal in structure. No evidence of mitral valve regurgitation. No evidence of mitral valve stenosis. Tricuspid Valve: The tricuspid valve is normal in structure. Tricuspid valve regurgitation is trivial. No evidence of tricuspid stenosis. Aortic Valve: The aortic valve is tricuspid. Aortic valve regurgitation is not visualized. No aortic stenosis is present. Pulmonic Valve: The pulmonic valve was normal in structure. Pulmonic valve regurgitation is trivial. No evidence of pulmonic stenosis. Aorta: The aortic root is normal in size and structure. Venous: The inferior vena cava is normal in size with greater than 50% respiratory variability, suggesting right atrial pressure of 3 mmHg. IAS/Shunts: No atrial level shunt detected by color flow Doppler.  LEFT VENTRICLE PLAX 2D LVIDd:         4.00 cm  Diastology LVIDs:         2.60 cm  LV e' medial:    8.27 cm/s LV PW:         1.00 cm  LV E/e' medial:  6.0 LV IVS:        1.20 cm  LV e' lateral:   10.30 cm/s LVOT diam:      2.40 cm  LV E/e'  lateral: 4.8 LV SV:         95 LV SV Index:   49 LVOT Area:     4.52 cm  RIGHT VENTRICLE RV S prime:     11.40 cm/s TAPSE (M-mode): 2.4 cm LEFT ATRIUM             Index       RIGHT ATRIUM           Index LA diam:        3.30 cm 1.72 cm/m  RA Area:     18.20 cm LA Vol (A2C):   73.3 ml 38.20 ml/m RA Volume:   52.10 ml  27.15 ml/m LA Vol (A4C):   27.2 ml 14.17 ml/m LA Biplane Vol: 46.6 ml 24.28 ml/m  AORTIC VALVE LVOT Vmax:   92.00 cm/s LVOT Vmean:  62.400 cm/s LVOT VTI:    0.209 m  AORTA Ao Root diam: 3.30 cm MITRAL VALVE MV Area (PHT): 1.77 cm    SHUNTS MV Decel Time: 428 msec    Systemic VTI:  0.21 m MV E velocity: 49.30 cm/s  Systemic Diam: 2.40 cm MV A velocity: 73.70 cm/s MV E/A ratio:  0.67 Kirk Ruths MD Electronically signed by Kirk Ruths MD Signature Date/Time: 01/17/2021/10:18:45 AM    Final     EKG: Independently reviewed.  S brady in the low 40s on the monitor.  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 Trent for AM consult: Long half life of Lanreotide (23-30 days + up to 85% increased half life in "older adults") Fact that lanreotide is first drug that's actually worked for carcinoid diarrhea in a long time. Chronic diarrhea - Pt actually not having diarrhea much this past week since getting lanreotide injection  (first time in a long time per patient). IVF: 500cc bolus and NS at 125 cc/hr GIB - No gross blood on exam but hemoccult positive GI consult in AM Holding eliquis for now Clear liquid diet Repeat CBC in AM Type and screen Acute PE - Small volume PE without RHS Eliquis on hold due to GIB Check BLE US DVT to see how much (if any) clot burden in LEs Then need to decide after this and after GIB addressed by GI: resume AC vs IVC filter Hypokalemia - Replace K , repeat BMP in AM Check Mg CKD 3 - Chronic, stable, baseline HTN - HTN meds DCd last admit to try and prevent near-syncope in setting of suspected vasodilation from carcinoid  DVT prophylaxis: None (Eliquis still in effect but being held, needs [place] BLE to see if clot present before SCDs) Code Status: Full Family Communication: Wife on phone Disposition Plan: Home after GIB work up, syncope / bradycardia work up [employer] called: Message sent to Dr. Paulita Fujita for AM consult, message sent to P. Abagail Kitchens for cards consult in AM Admission status: Admit to inpatient  Severity  of Illness: The appropriate patient status for this patient is INPATIENT. Inpatient status is judged to be reasonable and necessary in order to provide the required intensity of service to ensure the patient's safety. The patient's presenting symptoms, physical exam findings, and initial radiographic and laboratory data in the context of their chronic comorbidities is felt to place them at high risk for further clinical deterioration. Furthermore, it is not anticipated that the patient will be medically stable for discharge from the hospital within 2 midnights of admission. The following factors support the patient status of inpatient.   IP status due to: 1) complicated GIB in a patient with anticoagulation recent acute PEs in setting of metastatic CA 2) Recurrent syncope 3) Symptomatic bradycardia   * I certify that at the point of admission it is my clinical judgment  that the patient will require inpatient hospital care spanning beyond 2 midnights from the point of admission due to high intensity of service, high risk for further deterioration and high frequency of surveillance required.*   Dennis Killilea M. DO [facility]  How to contact the [facility] Attending or Consulting provider [place] or covering provider during after hours [place], for this patient?  Check the care team in [facility] and look for a) attending/consulting [facility] provider listed and b) the [facility] team listed Log into [email or web address]  Amion Physician Scheduling and messaging for groups and whole hospitals  On call and physician scheduling software for group practices, residents, hospitalists and other medical providers for call, clinic, rotation and shift schedules. On[employer] is a hospital-wide system for scheduling doctors and paging doctors on call. EasyPlot is for scientific plotting and data analysis.  [email or web address]  and use [place]'s universal password to access. If you do not have the password, please contact the hospital operator.  Locate the [facility] provider you are looking for under [facility] and page to a number that you can be directly reached. If you still have difficulty reaching the provider, please page the [facility] (Director on Call) for the Hospitalists listed on amion for assistance.  01/18/2021, 8:36 PM

## 2021-01-18 NOTE — ED Triage Notes (Signed)
This is a 80 yr old male, with c/o of general weakness, and was seen here in Ed 2 days ago for a mechanical fall, on blood thinner. Pt has hx of thoracic cancer, and orthostatic hypotension. He states he is dizzy when standing.  V/s on arrival: 120/70 cbg, 146, spo2 95 rm, hr 80 ns with pvc. Rr 16. 18 AC 500 ns given by ems.

## 2021-01-18 NOTE — ED Provider Notes (Signed)
[place] DEPT Provider Note   CSN: [id number] Arrival date & time: 01/18/21  1518     History Chief Complaint  Patient presents with   Weakness    Alan [name] is a 80 y.o. male.   Weakness  Patient presents to the ED for evaluation persistent episodes of weakness.  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  Patient states he has been having recurrent episodes of feeling lightheaded and dizzy.  He feels like he is going to pass out.  Patient states that happen too frequently.  He was recently in the hospital admitted on the 21st and was just discharged yesterday.  During that evaluation he was found to have a pulmonary embolism but he did not have any evidence of heart strain and the embolism was small.  He was discharged on anticoagulants.  Patient states he has been compliant with that medication.  Patient also has been having episodes of dizziness.  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  While in the hospital patient's blood pressure medications were discontinued.  They felt that could be true contributing to some of his episodes of dizziness.  Patient states since leaving the hospital he has had 3 more episodes of feeling lightheaded worries about going to pass out.  States his wife said that he did pass out at 1 point.  It was only for a second or 2.  He denies any injuries.  He denies any headache.  Has not noticed any blood in his stool.  No fevers or chills.  No chest pain or shortness of breath.  Past Medical History:  Diagnosis Date   Cancer [facility])     Patient Active Problem List   Diagnosis Date Noted   Acute pulmonary embolism ([place]) 01/16/2021    Past Surgical History:  Procedure Laterality Date   COLON SURGERY         No family history on file.  Social History   Tobacco Use   Smoking status: Former     Packs/day: 0.30    Years: 61.00    Pack years: 18.30    Types: Cigarettes    Start date: 52    Quit date: 03/03/2020    Years since quitting: 0.8   Smokeless tobacco: Never  Substance Use Topics   Alcohol use: Yes   Drug use: No    Home Medications Prior to Admission medications   Medication Sig Start Date End Date Taking? Authorizing Provider  albuterol (VENTOLIN HFA) 108 (90 Base) MCG/ACT inhaler Inhale 2 puffs into the lungs every 4 (four) hours as needed for shortness of breath. 07/23/20  Yes Sharion Balloon, NP  APIXABAN [facility] [place]) VTE STARTER PACK (10MG  AND 5MG ) Take as directed on package: start with two-5mg  tablets twice daily for 7 days. On day 8, switch to one-5mg  tablet twice daily. Patient taking differently: Take 10 mg by mouth See admin instructions. Take as directed on package: start with two-5mg  tablets twice daily for 7 days. On day 8, switch to one-5mg  tablet twice daily. 01/17/21  Yes Barb Merino, MD  Multiple Vitamin (MULTIVITAMIN WITH MINERALS) TABS Take 1 tablet by mouth daily.   Yes [provider]  Multiple Vitamins-Minerals (PRESERVISION AREDS 2 PO) Take 1 capsule by mouth daily.   Yes [provider]  SYMBICORT 160-4.5 MCG/ACT inhaler Inhale 2 puffs into the lungs at bedtime. 10/31/20  Yes [provider]  tamsulosin (FLOMAX) 0.4 MG CAPS capsule Take 0.4 mg by mouth daily. 07/08/20  Yes [provider]  Vitamin D, Ergocalciferol, (DRISDOL) 1.25 MG (50000 UNIT) CAPS capsule Take 50,000 Units by mouth every 7 (seven) days. 07/08/20  Yes [provider]    Allergies    Penicillins and Lisinopril  Review of Systems   Review of Systems  Neurological:  Positive for weakness.  All other systems reviewed and are negative.  Physical Exam Updated Vital Signs BP (!) 166/70   Pulse (!) 42   Temp 98.6 F (37 C) (Oral)   Resp 18   SpO2 99%   Physical Exam Vitals and nursing note reviewed.  Constitutional:      General:  He is not in acute distress.    Appearance: He is well-developed. He is not ill-appearing.  HENT:     Head: Normocephalic and atraumatic.     Right Ear: External ear normal.     Left Ear: External ear normal.  Eyes:     General: No scleral icterus.       Right eye: No discharge.        Left eye: No discharge.     Conjunctiva/sclera: Conjunctivae normal.  Neck:     Trachea: No tracheal deviation.  Cardiovascular:     Rate and Rhythm: Normal rate and regular rhythm.  Pulmonary:     Effort: Pulmonary effort is normal. No respiratory distress.     Breath sounds: Normal breath sounds. No stridor. No wheezing or rales.  Abdominal:     General: Bowel sounds are normal. There is no distension.     Palpations: Abdomen is soft.     Tenderness: There is no abdominal tenderness. There is no guarding or rebound.  Musculoskeletal:        General: Swelling present. No tenderness or deformity.     Cervical back: Neck supple.     Comments: Mild swelling and bruising right lower extremity, patient states this is a prior injury  Skin:    General: Skin is warm and dry.     Findings: No rash.  Neurological:     General: No focal deficit present.     Mental Status: He is alert.     Cranial Nerves: No cranial nerve deficit (no facial droop, extraocular movements intact, no slurred speech).     Sensory: No sensory deficit.     Motor: No abnormal muscle tone or seizure activity.     Coordination: Coordination normal.  Psychiatric:        Mood and Affect: Mood normal.    ED Results / Procedures / Treatments   Labs (all labs ordered are listed, but only abnormal results are displayed) Labs Reviewed  BASIC METABOLIC PANEL - Abnormal; Notable for the following components:      Result Value   Potassium 3.1 (*)    Chloride 115 (*)    Glucose, Bld 134 (*)    BUN 25 (*)    Calcium 7.5 (*)    All other components within normal limits  CBC WITH DIFFERENTIAL/PLATELET - Abnormal; Notable for the  following components:   RBC 3.62 (*)    Hemoglobin 11.6 (*)    HCT 32.6 (*)    Platelets 127 (*)    All other components within normal limits  CBG MONITORING, ED - Abnormal; Notable for the following components:   Glucose-Capillary 157 (*)    All other components within normal limits  POC OCCULT BLOOD, ED -  Abnormal; Notable for the following components:   Fecal Occult Bld POSITIVE (*)    All other components within normal limits  TROPONIN I (HIGH SENSITIVITY)  TROPONIN I (HIGH SENSITIVITY)    EKG EKG Interpretation  Date/Time:  Saturday January 18 2021 16:00:40 EDT Ventricular Rate:  53 PR Interval:  171 QRS Duration: 101 QT Interval:  404 QTC Calculation: 380 R Axis:   64 Text Interpretation: Sinus rhythm Anteroseptal infarct, old Since last tracing rate slower Confirmed by Dorie Rank [phone]) on 01/18/2021 6:44:56 PM  Radiology ECHOCARDIOGRAM COMPLETE  Result Date: 01/17/2021    ECHOCARDIOGRAM REPORT   Patient Name:   Alan [name] [facility] Date of Exam: 01/17/2021 Medical Rec #:  [id number]       Height:       74.0 in Accession #:    [id number]      Weight:       149.2 lb Date of Birth:  26-Oct-1940        BSA:          1.919 m Patient Age:    70 years        BP:           109/71 mmHg Patient Gender: M               HR:           49 bpm. Exam Location:  Inpatient Procedure: 2D Echo, Color Doppler and Cardiac Doppler Indications:    R55 Syncope  History:        Patient has prior history of Echocardiogram examinations, most                 recent 01/15/2016. Prior performed at [place]:    [place] Referring Phys: [phone] [place]  1. Left ventricular ejection fraction, by estimation, is 55 to 60%. The left ventricle has normal function. The left ventricle has no regional wall motion abnormalities. There is mild left ventricular hypertrophy of the basal-septal segment. Left ventricular diastolic parameters were normal.  2. Right ventricular systolic function is  normal. The right ventricular size is normal.  3. The mitral valve is normal in structure. No evidence of mitral valve regurgitation. No evidence of mitral stenosis.  4. The aortic valve is tricuspid. Aortic valve regurgitation is not visualized. No aortic stenosis is present.  5. The inferior vena cava is normal in size with greater than 50% respiratory variability, suggesting right atrial pressure of 3 mmHg. FINDINGS  Left Ventricle: Left ventricular ejection fraction, by estimation, is 55 to 60%. The left ventricle has normal function. The left ventricle has no regional wall motion abnormalities. The left ventricular internal cavity size was normal in size. There is  mild left ventricular hypertrophy of the basal-septal segment. Left ventricular diastolic parameters were normal. Right Ventricle: The right ventricular size is normal.Right ventricular systolic function is normal. Left Atrium: Left atrial size was normal in size. Right Atrium: Right atrial size was normal in size. Pericardium: Trivial pericardial effusion is present. Mitral Valve: The mitral valve is normal in structure. No evidence of mitral valve regurgitation. No evidence of mitral valve stenosis. Tricuspid Valve: The tricuspid valve is normal in structure. Tricuspid valve regurgitation is trivial. No evidence of tricuspid stenosis. Aortic Valve: The aortic valve is tricuspid. Aortic valve regurgitation is not visualized. No aortic stenosis is present. Pulmonic Valve: The pulmonic valve was normal in structure. Pulmonic valve regurgitation is trivial. No evidence of pulmonic stenosis. Aorta:  The aortic root is normal in size and structure. Venous: The inferior vena cava is normal in size with greater than 50% respiratory variability, suggesting right atrial pressure of 3 mmHg. IAS/Shunts: No atrial level shunt detected by color flow Doppler.  LEFT VENTRICLE PLAX 2D LVIDd:         4.00 cm  Diastology LVIDs:         2.60 cm  LV e' medial:    8.27  cm/s LV PW:         1.00 cm  LV E/e' medial:  6.0 LV IVS:        1.20 cm  LV e' lateral:   10.30 cm/s LVOT diam:     2.40 cm  LV E/e' lateral: 4.8 LV SV:         95 LV SV Index:   49 LVOT Area:     4.52 cm  RIGHT VENTRICLE RV S prime:     11.40 cm/s TAPSE (M-mode): 2.4 cm LEFT ATRIUM             Index       RIGHT ATRIUM           Index LA diam:        3.30 cm 1.72 cm/m  RA Area:     18.20 cm LA Vol (A2C):   73.3 ml 38.20 ml/m RA Volume:   52.10 ml  27.15 ml/m LA Vol (A4C):   27.2 ml 14.17 ml/m LA Biplane Vol: 46.6 ml 24.28 ml/m  AORTIC VALVE LVOT Vmax:   92.00 cm/s LVOT Vmean:  62.400 cm/s LVOT VTI:    0.209 m  AORTA Ao Root diam: 3.30 cm MITRAL VALVE MV Area (PHT): 1.77 cm    SHUNTS MV Decel Time: 428 msec    Systemic VTI:  0.21 m MV E velocity: 49.30 cm/s  Systemic Diam: 2.40 cm MV A velocity: 73.70 cm/s MV E/A ratio:  0.67 Kirk Ruths MD Electronically signed by Kirk Ruths MD Signature Date/Time: 01/17/2021/10:18:45 AM    Final     Procedures Procedures   Medications Ordered in ED Medications  sodium chloride 0.9 % bolus 500 mL (0 mLs Intravenous Stopped 01/18/21 1650)    And  0.9 %  sodium chloride infusion ( Intravenous New Bag/Given 01/18/21 1654)  potassium chloride SA (KLOR-CON) CR tablet 40 mEq (has no administration in time range)    ED Course  I have reviewed the triage vital signs and the nursing notes.  Pertinent labs & imaging results that were available during my care of the patient were reviewed by me and considered in my medical decision making (see chart for details).  Clinical Course as of 01/18/21 1940  Sat Jan 18, 2021  1843 Labs reviewed. [JK]  1843 Hemoglobin decreased from yesterday. [JK]  5625 Anabolic panel similar to previous [JK]  1847 Episodes of bradycardia down into the 40s noted [JK]  1920 Hemoccult is positive. [JK]    Clinical Course User Index [JK] Dorie Rank, MD   MDM Rules/Calculators/A&P                           Patient presented to  the ED for evaluation of recurrent near syncope.  Patient was recently in the hospital for same.  Patient was noted to have episodes of bradycardia while he was in the ED.  Possible this could be contributed to some of his near syncopal symptoms.  Also has been having  orthostatic type symptoms.  Patient was recently diagnosed with a PE but is not having any chest pain or shortness of breath and I do not think that is contributing to the near syncopal episodes.  Patient does have a downward trend in his hemoglobin.  He has not noticed any blood in his stool but he is guaiac positive.  Considering his recurrent syncope, bradycardia, guaiac positive stools and downward trend of his hemoglobin I will consult the medical service for admission.  Also consult GI Final Clinical Impression(s) / ED Diagnoses Final diagnoses:  Near syncope  Bradycardia  Heme + stool     Dorie Rank, MD 01/18/21 1941

## 2021-01-19 ENCOUNTER — Inpatient Hospital Stay (HOSPITAL_COMMUNITY): Payer: Medicare Other

## 2021-01-19 ENCOUNTER — Encounter (HOSPITAL_COMMUNITY): Payer: Medicare Other

## 2021-01-19 ENCOUNTER — Encounter (HOSPITAL_COMMUNITY): Payer: Self-pay | Admitting: Internal Medicine

## 2021-01-19 DIAGNOSIS — R001 Bradycardia, unspecified: Secondary | ICD-10-CM | POA: Diagnosis not present

## 2021-01-19 DIAGNOSIS — R55 Syncope and collapse: Secondary | ICD-10-CM | POA: Diagnosis not present

## 2021-01-19 DIAGNOSIS — K529 Noninfective gastroenteritis and colitis, unspecified: Secondary | ICD-10-CM | POA: Diagnosis not present

## 2021-01-19 DIAGNOSIS — I2699 Other pulmonary embolism without acute cor pulmonale: Secondary | ICD-10-CM | POA: Diagnosis not present

## 2021-01-19 LAB — CBC
HCT: 33.7 % — ABNORMAL LOW (ref 39.0–52.0)
Hemoglobin: 12 g/dL — ABNORMAL LOW (ref 13.0–17.0)
MCH: 32 pg (ref 26.0–34.0)
MCHC: 35.6 g/dL (ref 30.0–36.0)
MCV: 89.9 fL (ref 80.0–100.0)
Platelets: 130 10*3/uL — ABNORMAL LOW (ref 150–400)
RBC: 3.75 MIL/uL — ABNORMAL LOW (ref 4.22–5.81)
RDW: 13.1 % (ref 11.5–15.5)
WBC: 8.2 10*3/uL (ref 4.0–10.5)
nRBC: 0 % (ref 0.0–0.2)

## 2021-01-19 LAB — BASIC METABOLIC PANEL
Anion gap: 4 — ABNORMAL LOW (ref 5–15)
BUN: 25 mg/dL — ABNORMAL HIGH (ref 8–23)
CO2: 28 mmol/L (ref 22–32)
Calcium: 8.6 mg/dL — ABNORMAL LOW (ref 8.9–10.3)
Chloride: 109 mmol/L (ref 98–111)
Creatinine, Ser: 1.17 mg/dL (ref 0.61–1.24)
GFR, Estimated: >60 mL/min (ref >60)
Glucose, Bld: 127 mg/dL — ABNORMAL HIGH (ref 70–99)
Potassium: 3.5 mmol/L (ref 3.5–5.1)
Sodium: 141 mmol/L (ref 135–145)

## 2021-01-19 LAB — TYPE AND SCREEN
ABO/RH(D): A POS
Antibody Screen: NEGATIVE

## 2021-01-19 LAB — MAGNESIUM: Magnesium: 1.7 mg/dL (ref 1.7–2.4)

## 2021-01-19 LAB — CBG MONITORING, ED: Glucose-Capillary: 120 mg/dL — ABNORMAL HIGH (ref 70–99)

## 2021-01-19 MED ORDER — HYDROXYZINE HCL 10 MG PO TABS
10.0000 mg | ORAL_TABLET | Freq: Three times a day (TID) | ORAL | Status: DC | PRN
  Administered 2021-01-19 – 2021-01-22 (×4): 10 mg via ORAL
  Filled 2021-01-19 (×7): qty 1

## 2021-01-19 MED ORDER — VITAMIN D (ERGOCALCIFEROL) 1.25 MG (50000 UNIT) PO CAPS
50000.0000 [IU] | ORAL_CAPSULE | ORAL | Status: DC
  Filled 2021-01-19: qty 1

## 2021-01-19 MED ORDER — TAMSULOSIN HCL 0.4 MG PO CAPS
0.4000 mg | ORAL_CAPSULE | Freq: Every day | ORAL | Status: DC
  Administered 2021-01-20 – 2021-01-21 (×2): 0.4 mg via ORAL
  Filled 2021-01-19 (×2): qty 1

## 2021-01-19 NOTE — Progress Notes (Signed)
PROGRESS NOTE    Alan [name]  KCL:[id number] DOB: Aug 09, 1940 DOA: 01/18/2021 PCP: Vincente Liberty, MD   Chief Complaint  Patient presents with   Weakness    Brief Narrative:  Alan [name] is a 80 yo w/PMHx of Carcinoid tumor, HTN, CKD who presents with worsening presyncope. Recently treated for syncope/fall and found to have small Pes, for which he was started on anticoagulation.   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 Suspect that small PEs were incidental findings related to malignancy and not the cause of syncope. His symptoms are likely worsened in the setting of  - will check orthostatic vitals daily (7/24: lying 134/95, sititng 119/79, standing 93/56 standing 3 min 96/62) - Patient reports he does not drink much fluid at home, counseled on aggressive hydration - compression stockings - avoiding midodrine or fludro given HTN for now -- consider dispo in AM if walking without significant issue  Acute blood loss anemia Heme positive stools Mild drop in Hgb but no melena - GI consulted: conservative management - continue trending CBC - no plan for endoscopy at this time  Sinus Bradycardia - low suspicion for this contributing to syncope especially given onset after prior episodes of syncope - low HR baseline and no hypotension - no indication for PPM, Cardiology following  Pulmonary emboli CTPE 7/21: Tiny right middle lobe pulmonary emboli are seen - Would not continue anticoagulation as risk outweighs benefit - LE duplex w/o DVT, notable for popliteal/Baker's cyst (no symptoms reported)  Carcinoid  Tumor - holding Lanreotide; can likely resume  DVT prophylaxis: hold anticoagulation Code Status: full code Family Communication: discussed with patient Disposition:   Status is: Inpatient  Remains inpatient appropriate because:Ongoing diagnostic testing needed not appropriate for outpatient work up  Dispo: The patient is from: Home              Anticipated d/c is to: Home              Patient currently is not medically stable to d/c.   Difficult to place patient No    Consultants:  GI  Procedures:  N/A  Antimicrobials: N/A    Subjective: Overnight, no acute events. Patient reports not drinking enough water at home and was advised to do so by family, but had not. He is concerned due to frequent episodes recently.  Objective: Vitals:   01/19/21 0900 01/19/21 1200 01/19/21 1429 01/19/21 1456  BP: (!) 165/74 (!) 179/82 (!) 174/83   Pulse: (!) 51 (!) 42 (!) 46   Resp: (!) 21 (!) 25 20   Temp:   97.9 F (36.6 C)   TempSrc:      SpO2: 99% 100% 99%   Weight:    68.4 kg  Height:    6\' 1"  (1.854 m)    Intake/Output Summary (Last 24 hours) at 01/19/2021 1802 Last data filed at 01/19/2021 1430 Gross per 24 hour  Intake 1010 ml  Output 470 ml  Net 540 ml   Filed Weights   01/19/21 1456  Weight: 68.4 kg    Examination:  General exam: Appears calm and comfortable  Respiratory system: Clear  to auscultation. Respiratory effort normal. Cardiovascular system: S1 & S2 heard, RRR. No pedal edema. Gastrointestinal system: Abdomen is nondistended, soft and nontender. No organomegaly or masses felt. Normal bowel sounds heard. Central nervous system: Alert and oriented. No focal neurological deficits. Extremities: Normal tone Skin: No rashes, lesions or ulcers Psychiatry: Judgement and insight appear normal. Mood & affect appropriate.     Data Reviewed: I have personally reviewed following labs and imaging studies  CBC: Recent Labs  Lab 01/16/21 0959 01/17/21 0336  01/18/21 1616 01/19/21 0500  WBC 13.7* 10.2 9.4 8.2  NEUTROABS 11.5*  --  7.1  --   HGB 13.8 12.7* 11.6* 12.0*  HCT 40.2 35.8* 32.6* 33.7*  MCV 91.6 90.2 90.1 89.9  PLT 142* 133* 127* 130*    Basic Metabolic Panel: Recent Labs  Lab 01/16/21 0959 01/17/21 0336 01/18/21 1616 01/19/21 0500  NA 143 142 145 141  K 3.0* 3.5 3.1* 3.5  CL 105 107 115* 109  CO2 27 29 25 28   GLUCOSE 148* 125* 134* 127*  BUN 31* 26* 25* 25*  CREATININE 1.56* 1.29* 1.22 1.17  CALCIUM 9.4 9.1 7.5* 8.6*  MG 2.2  --   --  1.7  PHOS 2.8  --   --   --     GFR: Estimated Creatinine Clearance: 49.5 mL/min (by C-G formula based on SCr of 1.17 mg/dL).  Liver Function Tests: Recent Labs  Lab 01/16/21 0959 01/17/21 0336  AST 19 23  ALT 18 18  ALKPHOS 44 45  BILITOT 1.1 1.8*  PROT 6.5 6.5  ALBUMIN 3.7 3.7    CBG: Recent Labs  Lab 01/18/21 1602 01/19/21 0459  GLUCAP 157* 120*     Recent Results (from the past 240 hour(s))  Resp Panel by RT-PCR (Flu A&B, Covid) Nasopharyngeal Swab     Status: None   Collection Time: 01/16/21  1:39 PM   Specimen: Nasopharyngeal Swab; Nasopharyngeal(NP) swabs in vial transport medium  Result Value Ref Range Status   SARS Coronavirus 2 by RT PCR NEGATIVE NEGATIVE Final    Comment: (NOTE) SARS-CoV-2 target nucleic acids are NOT DETECTED.  The SARS-CoV-2 RNA is generally detectable in upper respiratory specimens during the acute phase of infection. The lowest concentration of SARS-CoV-2 viral copies this assay can detect is 138 copies/mL. A negative result does not preclude SARS-Cov-2 infection and should not be used as the sole basis for treatment or other patient management decisions. A negative result may occur with  improper specimen collection/handling, submission of specimen other than nasopharyngeal swab, presence of viral mutation(s) within the areas targeted by this assay, and inadequate number of viral copies(<138 copies/mL). A negative result must  be combined with clinical observations, patient history, and epidemiological information. The expected result is Negative.  Fact Sheet for Patients:  EntrepreneurPulse.com.au  Fact Sheet for Healthcare Providers:  IncredibleEmployment.be  This test is no t yet approved or cleared by the [place] FDA and  has been authorized for detection and/or diagnosis of SARS-CoV-2 by FDA under an Emergency Use Authorization (EUA). This EUA will remain  in effect (meaning this test can be used) for the duration of the COVID-19 declaration under Section 564(b)(1) of the Act, 21 U.S.C.section 360bbb-3(b)(1), unless the authorization is terminated  or revoked sooner.       Influenza A by PCR NEGATIVE NEGATIVE Final   Influenza B by PCR NEGATIVE NEGATIVE Final    Comment: (NOTE) The Xpert Xpress SARS-CoV-2/FLU/RSV plus assay is intended as an aid in  the diagnosis of influenza from Nasopharyngeal swab specimens and should not be used as a sole basis for treatment. Nasal washings and aspirates are unacceptable for Xpert Xpress SARS-CoV-2/FLU/RSV testing.  Fact Sheet for Patients: EntrepreneurPulse.com.au  Fact Sheet for Healthcare Providers: IncredibleEmployment.be  This test is not yet approved or cleared by the [place] FDA and has been authorized for detection and/or diagnosis of SARS-CoV-2 by FDA under an Emergency Use Authorization (EUA). This EUA will remain in effect (meaning this test can be used) for the duration of the COVID-19 declaration under Section 564(b)(1) of the Act, 21 U.S.C. section 360bbb-3(b)(1), unless the authorization is terminated or revoked.  Performed at [facility], [place] [address], [place], [place] [address]        Radiology Studies: VAS [place] LOWER EXTREMITY VENOUS (DVT)  Result Date: 01/19/2021  Lower Venous DVT Study Patient Name:  MALIIK KARNER [facility]  Date of  Exam:   01/19/2021 Medical Rec #: [id number]        Accession #:    [id number] Date of Birth: 1940/10/31         Patient Gender: M Patient Age:   80Y Exam Location:  [facility] Procedure:      VAS [place] LOWER EXTREMITY VENOUS (DVT) Referring Phys: [id number] [employer] [facility] --------------------------------------------------------------------------------  Other Indications: PE dx'd on 7/21 - patient started on Eliquis. 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 Comparison Study: No previous exams Performing Technologist: Jody Hill RVT, RDMS  Examination Guidelines: A complete evaluation includes B-mode imaging, spectral Doppler, color Doppler, and power Doppler as needed of all accessible portions of each vessel. Bilateral testing is considered an integral part of a complete examination. Limited examinations for reoccurring indications may be performed as noted. The reflux portion of the exam is performed with the patient in reverse Trendelenburg.  +---------+---------------+---------+-----------+----------+--------------+ RIGHT    CompressibilityPhasicitySpontaneityPropertiesThrombus Aging +---------+---------------+---------+-----------+----------+--------------+ CFV      Full           Yes      Yes                                 +---------+---------------+---------+-----------+----------+--------------+ SFJ      Full                                                        +---------+---------------+---------+-----------+----------+--------------+ FV Prox  Full           Yes      Yes                                 +---------+---------------+---------+-----------+----------+--------------+ FV Mid   Full           Yes      Yes                                 +---------+---------------+---------+-----------+----------+--------------+ FV DistalFull           Yes      Yes                                  +---------+---------------+---------+-----------+----------+--------------+  PFV      Full                                                        +---------+---------------+---------+-----------+----------+--------------+ POP      Full           Yes      Yes                                 +---------+---------------+---------+-----------+----------+--------------+ PTV      Full                                                        +---------+---------------+---------+-----------+----------+--------------+ PERO     Full                                                        +---------+---------------+---------+-----------+----------+--------------+   +---------+---------------+---------+-----------+----------+--------------+ LEFT     CompressibilityPhasicitySpontaneityPropertiesThrombus Aging +---------+---------------+---------+-----------+----------+--------------+ CFV      Full           Yes      Yes                                 +---------+---------------+---------+-----------+----------+--------------+ SFJ      Full                                                        +---------+---------------+---------+-----------+----------+--------------+ FV Prox  Full           Yes      Yes                                 +---------+---------------+---------+-----------+----------+--------------+ FV Mid   Full           Yes      Yes                                 +---------+---------------+---------+-----------+----------+--------------+ FV DistalFull           Yes      Yes                                 +---------+---------------+---------+-----------+----------+--------------+ PFV      Full                                                        +---------+---------------+---------+-----------+----------+--------------+  POP      Full           Yes      Yes                                  +---------+---------------+---------+-----------+----------+--------------+ PTV      Full                                                        +---------+---------------+---------+-----------+----------+--------------+ PERO     Full                                                        +---------+---------------+---------+-----------+----------+--------------+     Summary: BILATERAL: - No evidence of deep vein thrombosis seen in the lower extremities, bilaterally. - No evidence of superficial venous thrombosis in the lower extremities, bilaterally. - RIGHT: - No cystic structure found in the popliteal fossa.  LEFT: - A cystic structure is found in the popliteal fossa.  *See table(s) above for measurements and observations.    Preliminary      Scheduled Meds:  mometasone-formoterol  2 puff Inhalation BID   multivitamin with minerals  1 tablet Oral Daily   sodium chloride flush  3 mL Intravenous Q12H   tamsulosin  0.4 mg Oral Daily   [START ON 01/20/2021] Vitamin D (Ergocalciferol)  50,000 Units Oral Q7 days   Continuous Infusions:   LOS: 1 day    Cecille Rubin, MD MPH [facility]   To contact the attending physician between 7A-7P or the covering provider during after hours 7P-7A, please log into the web site [email or web address] and access using universal  password for that web site. If you do not have the password, please call the hospital operator.  01/19/2021, 6:02 PM

## 2021-01-19 NOTE — ED Notes (Signed)
[place] in room for procedure

## 2021-01-19 NOTE — Progress Notes (Signed)
BLE venous duplex has been completed.  Results can be found under chart review under CV PROC. 01/19/2021 9:30 AM Tiger Spieker RVT, RDMS

## 2021-01-19 NOTE — Consult Note (Addendum)
Cardiology Consultation:   Patient ID: Alan [name] MRN: [id number]; DOB: February 07, 1941  Admit date: 01/18/2021 Date of Consult: 01/19/2021  Primary Care Provider: Vincente Liberty, MD [facility] Cardiologist: None  [facility] Electrophysiologist:  None    Patient Profile:   Alan [name] is a 80 y.o. 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  History of Present Illness:   Alan [name] s a 80 y.o. 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  He has a hx of presyncope starting back in Jan 22 but not very frequent.  He was recently started on Lanreotide on 7/19 for chronic diarrhea from his Carcinoid and has helped significantly.  He was admitted 7/21-7/22 with 3 near syncopal episodes and was found to have a small PE with no right heart strain.  He was started on Eliquis and discharged home.   The pre syncope was felt to be related to drops in BP from vasodilation from his Carcinoid.  Despite his diarrhea stopping, he had several more pre syncopal episodes after discharge a few days ago.  He was noted to be bradycardic on exam in the ER today.    He tells me that his spells only occur when he stands up.  He had 2 episodes while in the shower and bent over to pick something up and got very dizzy.  He has never passed out completely (although his wife thinks he did).  He denies any chest pain, SOB, diaphoresis, palpitations or nausea prior to the events.  They have never occurred when sitting.  He tries to stay hydrated.  He tells me that he has had bradycardia for years and a long time ago was told his HR was in the 30's at [facility] but nothing done.  There is some concern that his new med, Lanreotide, that he was  recently placed on may be making his bradycardia worse.  Cardiology is now asked to consult.  He has no hx of cardiac disease.  He denies any anginal symptoms.  He denies any LE edema, PND, orthopnea.  He is a long time smoker and had coronary artery calcifications on Chest CTA on 7/21 and small PE in the RML.  He has no fm hx of heart disease.  hsTrop neg x 2 in ER.  HR on tele today is in the 50's.    Past Medical History:  Diagnosis Date   Asthma    Carcinoid tumor, small intestine, malignant (HCC)    Chronic diarrhea    CKD (chronic kidney disease) stage 3, GFR 30-59 ml/min (HCC)    HTN (hypertension)    Short gut syndrome     Past Surgical History:  Procedure Laterality Date   COLON SURGERY       Home Medications:  Prior to Admission medications   Medication Sig Start Date End Date Taking? Authorizing Provider  albuterol (VENTOLIN HFA) 108 (90 Base) MCG/ACT inhaler Inhale 2 puffs into the lungs every 4 (four) hours as needed for shortness of breath. 07/23/20  Yes Sharion Balloon, NP  APIXABAN Arne [place]) VTE STARTER PACK (10MG  AND 5MG ) Take as directed on package: start with two-5mg  tablets twice daily for 7 days. On day  8, switch to one-5mg  tablet twice daily. Patient taking differently: Take 10 mg by mouth See admin instructions. Take as directed on package: start with two-5mg  tablets twice daily for 7 days. On day 8, switch to one-5mg  tablet twice daily. 01/17/21  Yes Barb Merino, MD  Multiple Vitamin (MULTIVITAMIN WITH MINERALS) TABS Take 1 tablet by mouth daily.   Yes [provider]  Multiple Vitamins-Minerals (PRESERVISION AREDS 2 PO) Take 1 capsule by mouth daily.   Yes [provider]  SYMBICORT 160-4.5 MCG/ACT inhaler Inhale 2 puffs into the lungs at bedtime. 10/31/20  Yes [provider]  tamsulosin (FLOMAX) 0.4 MG CAPS capsule Take 0.4 mg by mouth daily. 07/08/20  Yes [provider]  Vitamin D, Ergocalciferol, (DRISDOL) 1.25 MG (50000 UNIT)  CAPS capsule Take 50,000 Units by mouth every 7 (seven) days. 07/08/20  Yes [provider]    Inpatient Medications: Scheduled Meds:  mometasone-formoterol  2 puff Inhalation BID   multivitamin with minerals  1 tablet Oral Daily   sodium chloride flush  3 mL Intravenous Q12H   Continuous Infusions:  sodium chloride 125 mL/hr at 01/19/21 0630   PRN Meds: acetaminophen **OR** acetaminophen, albuterol, loperamide, ondansetron **OR** ondansetron (ZOFRAN) IV  Allergies:    Allergies  Allergen Reactions   Penicillins Hives and Shortness Of Breath   Lisinopril Itching    Social History:   Social History   Socioeconomic History   Marital status: Married    Spouse name: Not on file   Number of children: Not on file   Years of education: Not on file   Highest education level: Not on file  Occupational History   Not on file  Tobacco Use   Smoking status: Former    Packs/day: 0.30    Years: 61.00    Pack years: 18.30    Types: Cigarettes    Start date: 23    Quit date: 03/03/2020    Years since quitting: 0.8   Smokeless tobacco: Never  Substance and Sexual Activity   Alcohol use: Yes   Drug use: No   Sexual activity: Not on file  Other Topics Concern   Not on file  Social History Narrative   Not on file   Social Determinants of Health   Financial Resource Strain: Not on file  Food Insecurity: Not on file  Transportation Needs: Not on file  Physical Activity: Not on file  Stress: Not on file  Social Connections: Not on file  Intimate Partner Violence: Not on file    Family History:    Family History  Problem Relation Age of Onset   Alzheimer's disease Mother      ROS:  Please see the history of present illness.   All other ROS reviewed and negative.     Physical Exam/Data:   Vitals:   01/19/21 0511 01/19/21 0616 01/19/21 0619 01/19/21 0900  BP: (!) 143/67  (!) 161/96 (!) 165/74  Pulse: (!) 48  (!) 49 (!) 51  Resp: (!) 21  19 (!) 21  Temp:   97.6 F (36.4 C)    TempSrc:  Oral    SpO2: 96%  94% 99%    Intake/Output Summary (Last 24 hours) at 01/19/2021 1130 Last data filed at 01/19/2021 0604 Gross per 24 hour  Intake 10 ml  Output 470 ml  Net -460 ml   Last 3 Weights 01/17/2021 01/16/2021 01/16/2021  Weight (lbs) 149 lb 3.2 oz 151 lb 7.3 oz 173 lb  Weight (kg) 67.677  kg 68.7 kg 78.472 kg     There is no height or weight on file to calculate BMI.  General:  Well nourished, well developed, in no acute distress HEENT: normal Lymph: no adenopathy Neck: no JVD Endocrine:  No thryomegaly Vascular: No carotid bruits; FA pulses 2+ bilaterally without bruits  Cardiac:  normal S1, S2; RRR; no murmur  Lungs:  clear to auscultation bilaterally, no wheezing, rhonchi or rales  Abd: soft, nontender, no hepatomegaly  Ext: no edema Musculoskeletal:  No deformities, BUE and BLE strength normal and equal Skin: warm and dry  Neuro:  CNs 2-12 intact, no focal abnormalities noted Psych:  Normal affect   EKG:  The EKG was personally reviewed and demonstrates:  sinus bradycardia at 53bpm with AS infarct and no acute ST changes Telemetry:  Telemetry was personally reviewed and demonstrates:  Sinus bradycardia at 52bpm  Relevant CV Studies: none  Laboratory Data:  High Sensitivity Troponin:   Recent Labs  Lab 01/18/21 1616 01/18/21 1746  TROPONINIHS 8 7     Chemistry Recent Labs  Lab 01/17/21 0336 01/18/21 1616 01/19/21 0500  NA 142 145 141  K 3.5 3.1* 3.5  CL 107 115* 109  CO2 29 25 28   GLUCOSE 125* 134* 127*  BUN 26* 25* 25*  CREATININE 1.29* 1.22 1.17  CALCIUM 9.1 7.5* 8.6*  GFRNONAA 56* >60 >60  ANIONGAP 6 5 4*    Recent Labs  Lab 01/16/21 0959 01/17/21 0336  PROT 6.5 6.5  ALBUMIN 3.7 3.7  AST 19 23  ALT 18 18  ALKPHOS 44 45  BILITOT 1.1 1.8*   Hematology Recent Labs  Lab 01/17/21 0336 01/18/21 1616 01/19/21 0500  WBC 10.2 9.4 8.2  RBC 3.97* 3.62* 3.75*  HGB 12.7* 11.6* 12.0*  HCT 35.8* 32.6*  33.7*  MCV 90.2 90.1 89.9  MCH 32.0 32.0 32.0  MCHC 35.5 35.6 35.6  RDW 13.0 13.2 13.1  PLT 133* 127* 130*   BNPNo results for input(s): BNP, PROBNP in the last 168 hours.  DDimer  Recent Labs  Lab 01/16/21 0959  DDIMER 12.54*     Radiology/Studies:  DG Chest 2 View  Result Date: 01/16/2021 CLINICAL DATA:  Recent fall today with left hip pain, initial encounter EXAM: CHEST - 2 VIEW COMPARISON:  07/23/2020 FINDINGS: Cardiac shadow is stable. Aortic calcifications are noted. The lungs are well aerated bilaterally. No focal infiltrate or effusion is seen. No bony abnormality is noted. Skin fold is noted over the left chest. IMPRESSION: No active cardiopulmonary disease. Electronically Signed   By: Inez Catalina M.D.   On: 01/16/2021 09:26   DG Knee 1-2 Views Right  Result Date: 01/16/2021 CLINICAL DATA:  Weakness, fell, metastatic liver cancer EXAM: RIGHT KNEE - 1-2 VIEW COMPARISON:  None. FINDINGS: Frontal and lateral views of the right knee are obtained. No fracture, subluxation, or dislocation. Moderate 3 compartmental osteoarthritis greatest in the lateral compartment. No joint effusion. Diffuse vascular calcifications. IMPRESSION: 1. Osteoarthritis.  No acute fracture. Electronically Signed   By: Randa Ngo M.D.   On: 01/16/2021 16:24   CT Head Wo Contrast  Result Date: 01/16/2021 CLINICAL DATA:  Minor head trauma.  History of metastatic disease. EXAM: CT HEAD WITHOUT CONTRAST TECHNIQUE: Contiguous axial images were obtained from the base of the skull through the vertex without intravenous contrast. COMPARISON:  None. FINDINGS: Brain: No evidence of acute infarction, hemorrhage, hydrocephalus, extra-axial collection or mass lesion/mass effect. Vascular: No hyperdense vessel or unexpected calcification. Skull: Normal. Negative  for fracture or focal lesion. Sinuses/Orbits: No acute finding. IMPRESSION: Negative head CT. Electronically Signed   By: Monte Fantasia M.D.   On: 01/16/2021  09:34   CT Angio Chest PE W and/or Wo Contrast  Result Date: 01/16/2021 CLINICAL DATA:  Weakness and shortness of breath EXAM: CT ANGIOGRAPHY CHEST WITH CONTRAST TECHNIQUE: Multidetector CT imaging of the chest was performed using the standard protocol during bolus administration of intravenous contrast. Multiplanar CT image reconstructions and MIPs were obtained to evaluate the vascular anatomy. CONTRAST:  75mL OMNIPAQUE IOHEXOL 350 MG/ML SOLN COMPARISON:  04/17/2010 FINDINGS: Cardiovascular: Atherosclerotic calcifications of the aorta are noted. No significant aortic opacification is identified to suggest dissection. No aneurysmal dilatation is noted. No cardiac enlargement is seen. Scattered coronary calcifications are noted. The pulmonary artery shows a normal branching pattern bilaterally. Tiny right middle lobe pulmonary emboli are seen. No other significant emboli are noted. No right heart strain is seen. Mediastinum/Nodes: Thoracic inlet is within normal limits. No sizable hilar or mediastinal adenopathy is noted. The esophagus demonstrates some retained fluid and food stuffs. This may be related to reflux. Lungs/Pleura: Lungs are well aerated bilaterally. Minimal dependent atelectatic changes are seen. No focal infiltrate or effusion is noted. No evidence of metastatic disease is seen. Upper Abdomen: Visualized upper abdomen shows renal cystic change on the left as well as cysts within the left lobe of the liver. No other focal abnormality in the upper abdomen is seen. Musculoskeletal: Degenerative changes of the thoracic spine are Review of the MIP images confirms the above findings. IMPRESSION: Small pulmonary emboli involving the right middle lobe as described. 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 Aortic Atherosclerosis (ICD10-I70.0). Electronically Signed   By: Inez Catalina M.D.   On: 01/16/2021 12:35   ECHOCARDIOGRAM COMPLETE  Result Date: 01/17/2021    ECHOCARDIOGRAM REPORT   Patient  Name:   LOWELL MAKARA [facility] Date of Exam: 01/17/2021 Medical Rec #:  [id number]       Height:       74.0 in Accession #:    [id number]      Weight:       149.2 lb Date of Birth:  10-Aug-1940        BSA:          1.919 m Patient Age:    22 years        BP:           109/71 mmHg Patient Gender: M               HR:           49 bpm. Exam Location:  Inpatient Procedure: 2D Echo, Color Doppler and Cardiac Doppler Indications:    R55 Syncope  History:        Patient has prior history of Echocardiogram examinations, most                 recent 01/15/2016. Prior performed at [place]:    [place] Referring Phys: [phone] [place]  1. Left ventricular ejection fraction, by estimation, is 55 to 60%. The left ventricle has normal function. The left ventricle has no regional wall motion abnormalities. There is mild left ventricular hypertrophy of the basal-septal segment. Left ventricular diastolic parameters were normal.  2. Right ventricular systolic function is normal. The right ventricular size is normal.  3. The mitral valve is normal in structure. No evidence of mitral valve regurgitation. No evidence of mitral stenosis.  4. The aortic valve is tricuspid. Aortic valve regurgitation is not visualized. No aortic stenosis is present.  5. The inferior vena cava is normal in size with greater than 50% respiratory variability, suggesting right atrial pressure of 3 mmHg. FINDINGS  Left Ventricle: Left ventricular ejection fraction, by estimation, is 55 to 60%. The left ventricle has normal function. The left ventricle has no regional wall motion abnormalities. The left ventricular internal cavity size was normal in size. There is  mild left ventricular hypertrophy of the basal-septal segment. Left ventricular diastolic parameters were normal. Right Ventricle: The right ventricular size is normal.Right ventricular systolic function is normal. Left Atrium: Left atrial size was normal in size. Right  Atrium: Right atrial size was normal in size. Pericardium: Trivial pericardial effusion is present. Mitral Valve: The mitral valve is normal in structure. No evidence of mitral valve regurgitation. No evidence of mitral valve stenosis. Tricuspid Valve: The tricuspid valve is normal in structure. Tricuspid valve regurgitation is trivial. No evidence of tricuspid stenosis. Aortic Valve: The aortic valve is tricuspid. Aortic valve regurgitation is not visualized. No aortic stenosis is present. Pulmonic Valve: The pulmonic valve was normal in structure. Pulmonic valve regurgitation is trivial. No evidence of pulmonic stenosis. Aorta: The aortic root is normal in size and structure. Venous: The inferior vena cava is normal in size with greater than 50% respiratory variability, suggesting right atrial pressure of 3 mmHg. IAS/Shunts: No atrial level shunt detected by color flow Doppler.  LEFT VENTRICLE PLAX 2D LVIDd:         4.00 cm  Diastology LVIDs:         2.60 cm  LV e' medial:    8.27 cm/s LV PW:         1.00 cm  LV E/e' medial:  6.0 LV IVS:        1.20 cm  LV e' lateral:   10.30 cm/s LVOT diam:     2.40 cm  LV E/e' lateral: 4.8 LV SV:         95 LV SV Index:   49 LVOT Area:     4.52 cm  RIGHT VENTRICLE RV S prime:     11.40 cm/s TAPSE (M-mode): 2.4 cm LEFT ATRIUM             Index       RIGHT ATRIUM           Index LA diam:        3.30 cm 1.72 cm/m  RA Area:     18.20 cm LA Vol (A2C):   73.3 ml 38.20 ml/m RA Volume:   52.10 ml  27.15 ml/m LA Vol (A4C):   27.2 ml 14.17 ml/m LA Biplane Vol: 46.6 ml 24.28 ml/m  AORTIC VALVE LVOT Vmax:   92.00 cm/s LVOT Vmean:  62.400 cm/s LVOT VTI:    0.209 m  AORTA Ao Root diam: 3.30 cm MITRAL VALVE MV Area (PHT): 1.77 cm    SHUNTS MV Decel Time: 428 msec    Systemic VTI:  0.21 m MV E velocity: 49.30 cm/s  Systemic Diam: 2.40 cm MV A velocity: 73.70 cm/s MV E/A ratio:  0.67 Kirk Ruths MD Electronically signed by Kirk Ruths MD Signature Date/Time: 01/17/2021/10:18:45 AM     Final    DG Hip Unilat W or Wo Pelvis 2-3 Views Left  Result Date: 01/16/2021 CLINICAL DATA:  Recent fall with left hip pain, initial encounter EXAM: DG HIP (WITH OR WITHOUT PELVIS) 2-3V LEFT COMPARISON:  None. FINDINGS: Pelvic ring is intact. Mild degenerative changes of left hip joint are noted. No acute fracture or dislocation is seen. No soft tissue abnormality is noted. Degenerative changes in the lower lumbar spine are noted. IMPRESSION: Degenerative change without acute abnormality. Electronically Signed   By: Inez Catalina M.D.   On: 01/16/2021 09:27   VAS [place] LOWER EXTREMITY VENOUS (DVT)  Result Date: 01/19/2021  Lower Venous DVT Study Patient Name:  NED KAKAR [facility]  Date of Exam:   01/19/2021 Medical Rec #: [id number]        Accession #:    [id number] Date of Birth: Jul 21, 1940         Patient Gender: M Patient Age:   80Y Exam Location:  [facility] Procedure:      VAS [place] LOWER EXTREMITY VENOUS (DVT) Referring Phys: [id number] [employer] [place] --------------------------------------------------------------------------------  Other Indications: PE dx'd on 7/21 - patient started on Eliquis. 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 Comparison Study: No previous exams Performing Technologist: Jody Hill RVT, RDMS  Examination Guidelines: A complete evaluation includes B-mode imaging, spectral Doppler, color Doppler, and power Doppler as needed of all accessible portions of each vessel. Bilateral testing is considered an integral part of a complete examination. Limited examinations for reoccurring indications may be performed as noted. The reflux portion of the exam is performed with the patient in reverse Trendelenburg.  +---------+---------------+---------+-----------+----------+--------------+ RIGHT    CompressibilityPhasicitySpontaneityPropertiesThrombus Aging +---------+---------------+---------+-----------+----------+--------------+ CFV      Full           Yes      Yes                                  +---------+---------------+---------+-----------+----------+--------------+ SFJ      Full                                                        +---------+---------------+---------+-----------+----------+--------------+ FV Prox  Full           Yes      Yes                                 +---------+---------------+---------+-----------+----------+--------------+ FV Mid   Full           Yes      Yes                                 +---------+---------------+---------+-----------+----------+--------------+ FV DistalFull           Yes      Yes                                 +---------+---------------+---------+-----------+----------+--------------+ PFV      Full                                                        +---------+---------------+---------+-----------+----------+--------------+ POP      Full  Yes      Yes                                 +---------+---------------+---------+-----------+----------+--------------+ PTV      Full                                                        +---------+---------------+---------+-----------+----------+--------------+ PERO     Full                                                        +---------+---------------+---------+-----------+----------+--------------+   +---------+---------------+---------+-----------+----------+--------------+ LEFT     CompressibilityPhasicitySpontaneityPropertiesThrombus Aging +---------+---------------+---------+-----------+----------+--------------+ CFV      Full           Yes      Yes                                 +---------+---------------+---------+-----------+----------+--------------+ SFJ      Full                                                        +---------+---------------+---------+-----------+----------+--------------+ FV Prox  Full           Yes      Yes                                  +---------+---------------+---------+-----------+----------+--------------+ FV Mid   Full           Yes      Yes                                 +---------+---------------+---------+-----------+----------+--------------+ FV DistalFull           Yes      Yes                                 +---------+---------------+---------+-----------+----------+--------------+ PFV      Full                                                        +---------+---------------+---------+-----------+----------+--------------+ POP      Full           Yes      Yes                                 +---------+---------------+---------+-----------+----------+--------------+ PTV      Full                                                        +---------+---------------+---------+-----------+----------+--------------+  PERO     Full                                                        +---------+---------------+---------+-----------+----------+--------------+     Summary: BILATERAL: - No evidence of deep vein thrombosis seen in the lower extremities, bilaterally. - No evidence of superficial venous thrombosis in the lower extremities, bilaterally. - RIGHT: - No cystic structure found in the popliteal fossa.  LEFT: - A cystic structure is found in the popliteal fossa.  *See table(s) above for measurements and observations.    Preliminary      Assessment and Plan:   Presyncope -by history this has been occurring since Jan 2022 per patient -initially felt to be due to volume depletion from chronic diarrhea due to carcinoid as well as peripheral dilatation from carcinoid -recently episodes have become more frequent -only occur when standing with no prodromal sx except dizziness -started on Lanreotide with resolution of diarrhea but dizziness has gotten worse -HR has been in the low to mid 50's but he says that he has had slow HRs all his life even as low as the 30's -his sx sound orthostatic in  nature -2D echo pending -check orthostatics and if positive then add thigh high compression hose.  May need abdominal binder -He has resting hypertension so would avoid Proamatine or Florinef at this time -Mestinon would be an option if orthostatics are + and do not improve with compression hose and Abd binder -check am Cortisol level -continue to monitor on tele for more significant bradyarrhythmias and consider outpt 30 day event monitor  2.  HTN  -he was not on any BP meds as outpt -may need to consider addition of ACE I or ARB -would avoid direct peripheral dilators  -no BB due to resting bradycardia  3.  Bradycardia -he tells me he has a hx of slow HR for years -HR in the 50's currently -continue to monitor on tele -recommend outpt event monitor to try to correlate drops in HR with presyncopal events -orthostatic BPs to assess chronotropic response to a drop in BP      For questions or updates, please contact [place] Please consult [email or web address] for contact info under    Signed, Fransico Him, MD  01/19/2021 11:30 AM

## 2021-01-19 NOTE — Consult Note (Signed)
[facility] Gastroenterology Consultation Note  Referring Provider: No ref. provider found Primary Care Physician:  Vincente Liberty, MD  Reason for Consultation:  anemia, Hemoccult positive stool  HPI: Alan [name] is a 80 y.o. male whom we were asked to see for decrease in Hgb with hemoccult-positive stools.  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  Patient has had recurrent bouts of dizziness and syncope, originally attributed to hypovolemia from chronic diarrhea, but now with lanreotide diarrhea much better but syncopal and dizziness episodes have worsened.  He was found to have small pulmonary embolism without right heart strain and placed recently on apixaban.  Patient re-presents to ED with dizziness, syncope.  His Hgb dropped about 2 grams.  Some red streaks on tissue paper after wiping.  Also with some penile/urinary bleeding.  Had endoscopy and colonoscopy about 6 months ago with Dr. Michail Sermon, endoscopy unrevealing, colonoscopy showing couple polyps (removed) and sigmoid diverticulosis and moderate internal hemorrhoids.  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 Authorizing Provider  albuterol (VENTOLIN HFA) 108 (90 Base) MCG/ACT inhaler Inhale 2 puffs into the lungs every 4 (four) hours as needed for shortness of breath. 07/23/20  Yes Sharion Balloon, NP  APIXABAN Arne [place]) VTE STARTER PACK (10MG  AND 5MG ) Take as directed on package: start with two-5mg  tablets twice daily for 7 days. On day 8, switch to one-5mg  tablet twice daily. Patient taking differently:  Take 10 mg by mouth See admin instructions. Take as directed on package: start with two-5mg  tablets twice daily for 7 days. On day 8, switch to one-5mg  tablet twice daily. 01/17/21  Yes Barb Merino, MD  Multiple Vitamin (MULTIVITAMIN WITH MINERALS) TABS Take 1 tablet by mouth daily.   Yes [provider]  Multiple Vitamins-Minerals (PRESERVISION AREDS 2 PO) Take 1 capsule by mouth daily.   Yes [provider]  SYMBICORT 160-4.5 MCG/ACT inhaler Inhale 2 puffs into the lungs at bedtime. 10/31/20  Yes [provider]  tamsulosin (FLOMAX) 0.4 MG CAPS capsule Take 0.4 mg by mouth daily. 07/08/20  Yes [provider]  Vitamin D, Ergocalciferol, (DRISDOL) 1.25 MG (50000 UNIT) CAPS capsule Take 50,000 Units by mouth every 7 (seven) days. 07/08/20  Yes [provider]    Current Facility-Administered Medications  Medication Dose Route Frequency Provider Last Rate Last Admin   0.9 %  sodium chloride infusion   Intravenous Continuous Dorie Rank, MD 125 mL/hr at 01/19/21 0630 New Bag at 01/19/21 0630   acetaminophen (TYLENOL) tablet 650 mg  650 mg Oral Q6H PRN Etta Quill, DO       Or   acetaminophen (TYLENOL) suppository 650 mg  650 mg Rectal Q6H PRN Etta Quill, DO       albuterol (PROVENTIL) (2.5 MG/3ML) 0.083% nebulizer solution 3 mL  3 mL Inhalation Q4H PRN Etta Quill, DO       loperamide (IMODIUM) capsule 2-4 mg  2-4 mg Oral PRN Etta Quill, DO       mometasone-formoterol (  DULERA) 200-5 MCG/ACT inhaler 2 puff  2 puff Inhalation BID Jennette Kettle M, DO       multivitamin with minerals tablet 1 tablet  1 tablet Oral Daily Alcario Drought, Jared M, DO       ondansetron [facility]) tablet 4 mg  4 mg Oral Q6H PRN Etta Quill, DO       Or   ondansetron [facility]) injection 4 mg  4 mg Intravenous Q6H PRN Etta Quill, DO       sodium chloride flush (NS) 0.9 % injection 3 mL  3 mL Intravenous Q12H Jennette Kettle M, DO   3 mL at 01/19/21 0007    Current Outpatient Medications  Medication Sig Dispense Refill   albuterol (VENTOLIN HFA) 108 (90 Base) MCG/ACT inhaler Inhale 2 puffs into the lungs every 4 (four) hours as needed for shortness of breath. 18 g 0   APIXABAN (ELIQUIS) VTE STARTER PACK (10MG  AND 5MG ) Take as directed on package: start with two-5mg  tablets twice daily for 7 days. On day 8, switch to one-5mg  tablet twice daily. (Patient taking differently: Take 10 mg by mouth See admin instructions. Take as directed on package: start with two-5mg  tablets twice daily for 7 days. On day 8, switch to one-5mg  tablet twice daily.) 1 each 0   Multiple Vitamin (MULTIVITAMIN WITH MINERALS) TABS Take 1 tablet by mouth daily.     Multiple Vitamins-Minerals (PRESERVISION AREDS 2 PO) Take 1 capsule by mouth daily.     SYMBICORT 160-4.5 MCG/ACT inhaler Inhale 2 puffs into the lungs at bedtime.     tamsulosin (FLOMAX) 0.4 MG CAPS capsule Take 0.4 mg by mouth daily.     Vitamin D, Ergocalciferol, (DRISDOL) 1.25 MG (50000 UNIT) CAPS capsule Take 50,000 Units by mouth every 7 (seven) days.      Allergies as of 01/18/2021 - Review Complete 01/18/2021  Allergen Reaction Noted   Penicillins Hives and Shortness Of Breath 01/21/2011   Lisinopril Itching 04/12/2012    Family History  Problem Relation Age of Onset   Alzheimer's disease Mother     Social History   Socioeconomic History   Marital status: Married    Spouse name: Not on file   Number of children: Not on file   Years of education: Not on file   Highest education level: Not on file  Occupational History   Not on file  Tobacco Use   Smoking status: Former    Packs/day: 0.30    Years: 61.00    Pack years: 18.30    Types: Cigarettes    Start date: 26    Quit date: 03/03/2020    Years since quitting: 0.8   Smokeless tobacco: Never  Substance and Sexual Activity   Alcohol use: Yes   Drug use: No   Sexual activity: Not on file  Other Topics Concern   Not on file  Social  History Narrative   Not on file   Social Determinants of Health   Financial Resource Strain: Not on file  Food Insecurity: Not on file  Transportation Needs: Not on file  Physical Activity: Not on file  Stress: Not on file  Social Connections: Not on file  Intimate Partner Violence: Not on file    Review of Systems: As per HPI, all others negative  Physical Exam: Vital signs in last 24 hours: Temp:  [97.6 F (36.4 C)-98.6 F (37 C)] 97.6 F (36.4 C) (07/24 0616) Pulse Rate:  [41-57] 51 (07/24 0900) Resp:  [14-22] 21 (  07/24 0900) BP: (129-168)/(64-117) 165/74 (07/24 0900) SpO2:  [94 %-100 %] 99 % (07/24 0900)   General:   Alert, younger-appearing than stated age Well-developed, well-nourished, pleasant and cooperative in NAD Head:  Normocephalic and atraumatic. Eyes:  Sclera clear, no icterus.   Conjunctiva pink. Ears:  Normal auditory acuity. Nose:  No deformity, discharge,  or lesions. Mouth:  No deformity or lesions.  Oropharynx pink & moist. Neck:  Supple; no masses or thyromegaly. Abdomen:  Soft, nontender and nondistended. No masses, hepatosplenomegaly or hernias noted. Normal bowel sounds, without guarding, and without rebound.     Msk:  Symmetrical without gross deformities. Normal posture. Pulses:  Normal pulses noted. Extremities:  Without clubbing or edema. Neurologic:  Alert and  oriented x4;  grossly normal neurologically. Skin:  Intact without significant lesions or rashes. Psych:  Alert and cooperative. Normal mood and affect.   Lab Results: Recent Labs    01/17/21 0336 01/18/21 1616 01/19/21 0500  WBC 10.2 9.4 8.2  HGB 12.7* 11.6* 12.0*  HCT 35.8* 32.6* 33.7*  PLT 133* 127* 130*   BMET Recent Labs    01/17/21 0336 01/18/21 1616 01/19/21 0500  NA 142 145 141  K 3.5 3.1* 3.5  CL 107 115* 109  CO2 29 25 28   GLUCOSE 125* 134* 127*  BUN 26* 25* 25*  CREATININE 1.29* 1.22 1.17  CALCIUM 9.1 7.5* 8.6*   LFT Recent Labs    01/17/21 0336   PROT 6.5  ALBUMIN 3.7  AST 23  ALT 18  ALKPHOS 45  BILITOT 1.8*   PT/INR No results for input(s): LABPROT, INR in the last 72 hours.  Studies/Results:  Impression:   Anemia, mild. Hemoccult positive stools. Blood on tissue paper, hemorrhoidal? Genitourinary bleeding. Recurrent dizziness/syncope. Recent small PE diagnosed, recently on apixiban.  Plan:   Hold apixaban for a couple days, if possible.  May need to do risk/benefit analysis about use of apixiban, or anticoagulation in general.  Pending this answer, not sure if patient would benefit from IVC filter? Syncope/dizziness work-up underway, at this point don't think we can attribute this to GI bleeding or hypovolemia, though could be related to vasovagal effects from carcinoid syndrome. I suspect patient is having generalized mucosal bleeding from genitourinary tract and gastrointestinal tract, but I doubt there is going to be focal bleeding site, and as such don't see utility of performing any type of endoscopic procedure at the present time. [facility] will follow along at a distance; please let us know when/if any further GI questions arise; thank you for the consultation.   LOS: 1 day   Eivin Mascio M  01/19/2021, 11:50 AM  Cell [phone] If no answer or after 5 PM call [phone]

## 2021-01-19 NOTE — Plan of Care (Signed)
Patient is aware to call for assistance when needing to get out of the bed. Call button and personal belongings have been placed within reach.

## 2021-01-20 ENCOUNTER — Telehealth: Payer: Self-pay

## 2021-01-20 DIAGNOSIS — R001 Bradycardia, unspecified: Secondary | ICD-10-CM | POA: Diagnosis not present

## 2021-01-20 DIAGNOSIS — R55 Syncope and collapse: Secondary | ICD-10-CM | POA: Diagnosis not present

## 2021-01-20 LAB — CORTISOL-AM, BLOOD: Cortisol - AM: 18.2 ug/dL (ref 6.7–22.6)

## 2021-01-20 LAB — GLUCOSE, CAPILLARY: Glucose-Capillary: 110 mg/dL — ABNORMAL HIGH (ref 70–99)

## 2021-01-20 MED ORDER — SODIUM CHLORIDE 0.9 % IV BOLUS: 250.0000 mL | Freq: Once | INTRAVENOUS | Status: DC

## 2021-01-20 MED ORDER — SODIUM CHLORIDE 0.9 % IV SOLN: INTRAVENOUS | Status: DC

## 2021-01-20 MED ORDER — PROSOURCE PLUS PO LIQD
30.0000 mL | Freq: Three times a day (TID) | ORAL | Status: DC
  Administered 2021-01-20 – 2021-01-21 (×4): 30 mL via ORAL
  Filled 2021-01-20 (×6): qty 30

## 2021-01-20 NOTE — Evaluation (Signed)
Occupational Therapy Evaluation Patient Details Name: Alan [name] MRN: [id number] DOB: June 06, 1941 Today's Date: 01/20/2021    History of Present Illness Pt is a 80 y/o male admitted 7/21-7/22 for near syncope episodes.  Work up revealed small PE without RHS.  Pt started on eliquis.  Pt discharged home on 7/22.  Was felt that his near syncope episodes were possibly due to vasodilation from carcinoid.  No arrythmias on monitor that admit.  Pt has continued to have multiple near syncopal episodes.  Returns to ED on 7/23. PMH significant for malignant carcinoid tumor, HTN, BPH.   Clinical Impression   Patient evaluated by Occupational Therapy with no further acute OT needs identified. All education has been completed and the patient has no further questions.  See below for any follow-up Occupational Therapy or equipment needs. OT is signing off. Thank you for this referral. Orthostatics as follows: BP Supine: 133/85, HR: 50 BP EOB: 111/83, HR: 55 BP Initial Stand: 103/66, HR: 63 BP 3 min stand: 93/65, HR: 67     Follow Up Recommendations  Home health OT    Equipment Recommendations   (RW with 5" wheels. Pt/spouse report RW they went home with is too wide to fit through doorways. They describe a bariatric walker.  Spouse reported plan to return the wide walker to hospital today.)    Recommendations for Other Services       Precautions / Restrictions Precautions Precautions: Fall Restrictions Weight Bearing Restrictions: No      Mobility Bed Mobility Overal bed mobility: Modified Independent             General bed mobility comments: HOB elevated. Pt transitioned Supine<>Sit Mod I    Transfers Overall transfer level: Needs assistance Equipment used: Rolling walker (2 wheeled) Transfers: Sit to/from Stand Sit to Stand: Min assist         General transfer comment: Pt required a Min As boost to power up.    Balance Overall balance assessment: Needs  assistance Sitting-balance support: Feet supported Sitting balance-Leahy Scale: Good   Postural control: Left lateral lean Standing balance support: Bilateral upper extremity supported Standing balance-Leahy Scale: Poor (Fair static) Standing balance comment: Pt able to stand staically with unilateral UE support on RW to allow for orthostatic BPs. No LOB.  Pt endorses unsteadiness with dynamic balance and ambulation and reports that he lists to the LT.                           ADL either performed or assessed with clinical judgement   ADL Overall ADL's : At baseline                                       General ADL Comments: Pt is able to perform his BADLs seated with setup only. Pt donned socks while seated: Mod I.     Vision Baseline Vision/History: Wears glasses Patient Visual Report: No change from baseline Vision Assessment?: No apparent visual deficits     Perception     Praxis      Pertinent Vitals/Pain Pain Assessment: 0-10 Pain Score: 4  Pain Location: R great toe and RT knee Pain Descriptors / Indicators: Sore Pain Intervention(s): Limited activity within patient's tolerance;Monitored during session     Hand Dominance Right   Extremity/Trunk Assessment Upper Extremity Assessment Upper Extremity Assessment: Overall WFL for tasks assessed  Lower Extremity Assessment Lower Extremity Assessment: Defer to PT evaluation   Cervical / Trunk Assessment Cervical / Trunk Assessment: Kyphotic   Communication Communication Communication: No difficulties   Cognition Arousal/Alertness: Awake/alert Behavior During Therapy: WFL for tasks assessed/performed;Anxious Overall Cognitive Status: Within Functional Limits for tasks assessed                                 General Comments: pleasant though anxious to discharge home. Repeats some questions.   General Comments       Exercises     Shoulder Instructions       Home Living Family/patient expects to be discharged to:: Private residence Living Arrangements: Spouse/significant other Available Help at Discharge: Family Type of Home: House Home Access: Stairs to enter Technical brewer of Steps: 5 Entrance Stairs-Rails: Can reach both [place]: One level     Bathroom Shower/Tub: Occupational psychologist: Handicapped height     Home Equipment: [place] - single point;Shower seat - built in;Grab bars - toilet;Hand held shower head;Bedside commode;Grab bars - tub/shower   Additional Comments: Pt's wife on facetime and reports that too wide of a rolling walker went home with them and does not fit in the house. Pt's spouse agreed to bring this walker back to hospital in hopes of attaining a RW with regular width.      Prior Functioning/Environment          Comments: Pt reports "I didn't have time to try anything when I got home. I came right back here."  Prior to pt's 7/21 ad ission, he was Independent with BADLs. Shared IADLs with spouse.        OT Problem List: Pain;Impaired balance (sitting and/or standing)      OT Treatment/Interventions:      OT Goals(Current goals can be found in the care plan section) Acute Rehab OT Goals Patient Stated Goal: Go home today OT Goal Formulation: All assessment and education complete, DC therapy  OT Frequency:     Barriers to D/C:            Co-evaluation              AM-PAC OT "6 Clicks" Daily Activity     Outcome Measure Help from another person eating meals?: None Help from another person taking care of personal grooming?: None Help from another person toileting, which includes using toliet, bedpan, or urinal?: None Help from another person bathing (including washing, rinsing, drying)?: A Little Help from another person to put on and taking off regular upper body clothing?: None Help from another person to put on and taking off regular lower body clothing?: None 6  Click Score: 23   End of Session Equipment Utilized During Treatment: Rolling walker Nurse Communication: Mobility status (Orthostatics)  Activity Tolerance: Patient tolerated treatment well Patient left: in bed;with call bell/phone within reach;with bed alarm set  OT Visit Diagnosis: Unsteadiness on feet (R26.81);Repeated falls (R29.6);History of falling (Z91.81);Pain Pain - Right/Left: Right Pain - part of body: Knee;Ankle and joints of foot                Time: 1233-1311 OT Time Calculation (min): 38 min Charges:  OT General Charges $OT Visit: 1 Visit OT Evaluation $OT Eval Moderate Complexity: 1 Mod OT Treatments $Self Care/Home Management : 8-22 mins $Therapeutic Activity: 8-22 mins  [place], OT Acute Rehab Services Office: [phone] 01/20/2021  Julien Girt  01/20/2021, 1:44 PM

## 2021-01-20 NOTE — Progress Notes (Signed)
   01/20/21 2320  Orthostatic Lying   BP- Lying 133/81  Pulse- Lying 50  Orthostatic Sitting  BP- Sitting 106/74  Pulse- Sitting 58  Orthostatic Standing at 0 minutes  BP- Standing at 0 minutes (!) 84/59  Pulse- Standing at 0 minutes 78  Orthostatic Standing at 3 minutes  BP- Standing at 3 minutes (!) 72/44  Pulse- Standing at 3 minutes 91

## 2021-01-20 NOTE — Progress Notes (Signed)
Progress Note  Patient Name: Alan [name] Date of Encounter: 01/20/2021  [facility] Cardiologist: None   Subjective   Still having some orthostatic hypotension per orthostatic vital signs 3 AM for instance.  Laying comfortably in bed currently.  Inpatient Medications    Scheduled Meds:  mometasone-formoterol  2 puff Inhalation BID   multivitamin with minerals  1 tablet Oral Daily   sodium chloride flush  3 mL Intravenous Q12H   tamsulosin  0.4 mg Oral Daily   Vitamin D (Ergocalciferol)  50,000 Units Oral Q7 days   Continuous Infusions:  PRN Meds: acetaminophen **OR** acetaminophen, albuterol, hydrOXYzine, loperamide, ondansetron **OR** ondansetron (ZOFRAN) IV   Vital Signs    Vitals:   01/20/21 0525 01/20/21 0526 01/20/21 0539 01/20/21 0802  BP: (!) 84/54 99/70 (!) 87/65   Pulse: 70 61 68   Resp:      Temp:      TempSrc:      SpO2: 94% 94% 96% 94%  Weight:   68.3 kg   Height:        Intake/Output Summary (Last 24 hours) at 01/20/2021 0948 Last data filed at 01/20/2021 0500 Gross per 24 hour  Intake 1000 ml  Output 500 ml  Net 500 ml   Last 3 Weights 01/20/2021 01/19/2021 01/17/2021  Weight (lbs) 150 lb 9.2 oz 150 lb 12.7 oz 149 lb 3.2 oz  Weight (kg) 68.3 kg 68.4 kg 67.677 kg      Telemetry    Heart rates in the 40s intermittently, sinus bradycardia.  No pauses, no red alarms on telemetry.  No adverse arrhythmias.  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  Thin Neck: No JVD Cardiac: RRR, no murmurs, rubs, or gallops.  Respiratory: Clear to auscultation bilaterally. GI: Soft, nontender, non-distended  MS: No edema; No deformity. Neuro:  Nonfocal  Psych: Normal affect   Labs    High Sensitivity Troponin:   Recent Labs  Lab 01/18/21 1616 01/18/21 1746  TROPONINIHS 8 7      Chemistry Recent  Labs  Lab 01/16/21 0959 01/17/21 0336 01/18/21 1616 01/19/21 0500  NA 143 142 145 141  K 3.0* 3.5 3.1* 3.5  CL 105 107 115* 109  CO2 27 29 25 28   GLUCOSE 148* 125* 134* 127*  BUN 31* 26* 25* 25*  CREATININE 1.56* 1.29* 1.22 1.17  CALCIUM 9.4 9.1 7.5* 8.6*  PROT 6.5 6.5  --   --   ALBUMIN 3.7 3.7  --   --   AST 19 23  --   --   ALT 18 18  --   --   ALKPHOS 44 45  --   --   BILITOT 1.1 1.8*  --   --   GFRNONAA 45* 56* >60 >60  ANIONGAP 11 6 5  4*     Hematology Recent Labs  Lab 01/17/21 0336 01/18/21 1616 01/19/21 0500  WBC 10.2 9.4 8.2  RBC 3.97* 3.62* 3.75*  HGB 12.7* 11.6* 12.0*  HCT 35.8* 32.6* 33.7*  MCV 90.2 90.1 89.9  MCH 32.0 32.0 32.0  MCHC 35.5 35.6 35.6  RDW 13.0 13.2 13.1  PLT 133* 127* 130*    BNPNo results for input(s): BNP, PROBNP in the last 168 hours.   DDimer  Recent Labs  Lab 01/16/21 0959  DDIMER 12.54*     Radiology  VAS [place] LOWER EXTREMITY VENOUS (DVT)  Result Date: 01/19/2021  Lower Venous DVT Study Patient Name:  Alan [name] [facility]  Date of Exam:   01/19/2021 Medical Rec #: [id number]        Accession #:    [id number] Date of Birth: 1940/08/24         Patient Gender: M Patient Age:   80Y Exam Location:  [facility] Procedure:      VAS [place] LOWER EXTREMITY VENOUS (DVT) Referring Phys: [id number] [place] --------------------------------------------------------------------------------  Other Indications: PE dx'd on 7/21 - patient started on Eliquis. 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 Comparison Study: No previous exams Performing Technologist: Jody Hill RVT, RDMS  Examination Guidelines: A complete evaluation includes B-mode imaging, spectral Doppler, color Doppler, and power Doppler as needed of all accessible portions of each vessel. Bilateral testing is considered an integral part of a complete examination. Limited examinations for reoccurring indications may be performed as noted. The reflux portion of the exam is performed  with the patient in reverse Trendelenburg.  +---------+---------------+---------+-----------+----------+--------------+ RIGHT    CompressibilityPhasicitySpontaneityPropertiesThrombus Aging +---------+---------------+---------+-----------+----------+--------------+ CFV      Full           Yes      Yes                                 +---------+---------------+---------+-----------+----------+--------------+ SFJ      Full                                                        +---------+---------------+---------+-----------+----------+--------------+ FV Prox  Full           Yes      Yes                                 +---------+---------------+---------+-----------+----------+--------------+ FV Mid   Full           Yes      Yes                                 +---------+---------------+---------+-----------+----------+--------------+ FV DistalFull           Yes      Yes                                 +---------+---------------+---------+-----------+----------+--------------+ PFV      Full                                                        +---------+---------------+---------+-----------+----------+--------------+ POP      Full           Yes      Yes                                 +---------+---------------+---------+-----------+----------+--------------+ PTV      Full                                                        +---------+---------------+---------+-----------+----------+--------------+  PERO     Full                                                        +---------+---------------+---------+-----------+----------+--------------+   +---------+---------------+---------+-----------+----------+--------------+ LEFT     CompressibilityPhasicitySpontaneityPropertiesThrombus Aging +---------+---------------+---------+-----------+----------+--------------+ CFV      Full           Yes      Yes                                  +---------+---------------+---------+-----------+----------+--------------+ SFJ      Full                                                        +---------+---------------+---------+-----------+----------+--------------+ FV Prox  Full           Yes      Yes                                 +---------+---------------+---------+-----------+----------+--------------+ FV Mid   Full           Yes      Yes                                 +---------+---------------+---------+-----------+----------+--------------+ FV DistalFull           Yes      Yes                                 +---------+---------------+---------+-----------+----------+--------------+ PFV      Full                                                        +---------+---------------+---------+-----------+----------+--------------+ POP      Full           Yes      Yes                                 +---------+---------------+---------+-----------+----------+--------------+ PTV      Full                                                        +---------+---------------+---------+-----------+----------+--------------+ PERO     Full                                                        +---------+---------------+---------+-----------+----------+--------------+  Summary: BILATERAL: - No evidence of deep vein thrombosis seen in the lower extremities, bilaterally. - No evidence of superficial venous thrombosis in the lower extremities, bilaterally. - RIGHT: - No cystic structure found in the popliteal fossa.  LEFT: - A cystic structure is found in the popliteal fossa.  *See table(s) above for measurements and observations.    Preliminary     Cardiac Studies   Echocardiogram 01/17/2021: - EF 60% mild LVH normal RV no valvular issues.  Patient Profile     80 y.o. male here with syncope, carcinoid tumor related to orthostatic hypotension.  Assessment & Plan    Orthostatic hypotension/syncope -  Been having more episodes since January 2022.  Has been counseled on increasing hydration.  Has been avoiding midodrine or fludrocortisone given intermittent hypertension.  We can always entertain starting midodrine  2.5 mg 3 times daily with meals.  Labile blood pressures.  Compression stockings recommended.  Could try abdominal binder. -Carcinoid can contribute to vasodilatation worsening orthostasis. -Clearly stated that his "spells "only occur when he stands up.  Had 2 episodes in the shower bent over to pick something up and got very dizzy. -Reassuring echocardiogram. -Reassuring a.m. cortisol 18.2. -Lying blood pressure 121/69 with pulse of 84, sitting 108/64 with pulse of 77, standing blood pressure of 84/54 with pulse of 63, standing at 3 minutes blood pressure of 85/68 with pulse of 68. -Likely better hydrated based upon creatinine decrease  Anemia - Heme positive stools.  Per primary team.  Planning to manage conservatively without endoscopy.  Hemoglobin now 12.  Sinus bradycardia - Does not appear to be contributing to syncope.  Orthostatic hypotension does.  When his heart rate is low there is no evidence of hypotension concurrently.  There is no indication for pacemaker at this time. -Heart rate is not significantly decreasing during periods of hypotension and orthostasis as demonstrated by orthostatic vital signs.  Tiny pulmonary emboli - Right-sided emboli were noted.  It was determined that anticoagulation risks would be too high and outweigh the benefit.  No DVT noted, no indication for filter.  Carcinoid tumor - Per oncology.  Has chronic diarrhea with this.  His medication lanreotide has helped out.  I think is fine with him to continue this medication.  3 to 18% of people do experience bradycardia with this medication however his bradycardia is asymptomatic and not the reason for his hypotension.   For questions or updates, please contact [place] Please consult  [email or web address] for contact info under        Signed, Candee Furbish, MD  01/20/2021, 9:48 AM

## 2021-01-20 NOTE — Progress Notes (Signed)
PROGRESS NOTE    Alan [name]  WJX:[id number] DOB: 03/01/1941 DOA: 01/18/2021 PCP: Vincente Liberty, MD   Chief Complaint  Patient presents with   Weakness    Brief Narrative:  Alan [name] is a 80 yo w/PMHx of Carcinoid tumor, HTN, CKD who presented with worsening presyncope. Recently treated for syncope/fall and found to have small Pes, for which he was started on anticoagulation.   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 Suspect that small PEs were incidental findings related to malignancy and not the cause of syncope.  Patient reports he does not drink much fluid at home, counseled on aggressive hydration.   - avoiding midodrine or fludro given HTN intermittently.  As recommended by cardiology as well.  Patient still remains orthostatic positive.  I have ordered compression stockings as cardiology recommended.  Neck step will be abdominal binder if this does not help.  Patient frustrated for being in the hospital and uncomfortable bed.  He was very insisting to discharge him home.  Had a lengthy discussion with the patient and counseled him that he is not medically ready and not safe for discharge.  He agreed to stay overnight but he is really hoping that he will be discharged tomorrow.  I did explain to him that it will all depends on how he feels and how his vitals will be back tomorrow.  Acute blood loss anemia Heme positive stools Mild drop in Hgb but no melena.  Likely mucosal bleeding as GI has outlined in their note.  Currently plan to manage conservatively with no endoscopies.  GI following.  Sinus  Bradycardia - low suspicion for this contributing to syncope especially given onset after prior episodes of syncope - low HR baseline and no hypotension - no indication for PPM, Cardiology following.  Heart rate within normal range now.  Pulmonary emboli CTPE 7/21: Tiny right middle lobe pulmonary emboli are seen - Would not continue anticoagulation as risk outweighs benefit - LE duplex w/o DVT, notable for popliteal/Baker's cyst (no symptoms reported).  No indication of IVC filter.  Carcinoid Tumor - holding Lanreotide; can likely resume  DVT prophylaxis: hold anticoagulation Code Status: full code Family Communication: discussed with patient Disposition:   Status is: Inpatient  Remains inpatient appropriate because:Ongoing diagnostic testing needed not appropriate for outpatient work up  Dispo: The patient is from: Home              Anticipated d/c is to: Home              Patient currently is not medically stable to d/c.   Difficult to place patient No    Consultants:  GI  Procedures:  N/A  Antimicrobials: N/A    Subjective: Seen and examined.  He continues to feel dizzy every time he gets up but he is very insistent on going home today.  Objective: Vitals:   01/20/21 0525 01/20/21 0526 01/20/21 0539 01/20/21 0802  BP: (!) 84/54 99/70 (!) 87/65   Pulse: 70 61 68   Resp:      Temp:      TempSrc:      SpO2: 94%  94% 96% 94%  Weight:   68.3 kg   Height:        Intake/Output Summary (Last 24 hours) at 01/20/2021 0846 Last data filed at 01/20/2021 0500 Gross per 24 hour  Intake 1000 ml  Output 500 ml  Net 500 ml    Filed Weights   01/19/21 1456 01/20/21 0539  Weight: 68.4 kg 68.3 kg    Examination:  General exam: Appears calm and comfortable  Respiratory system: Clear to auscultation. Respiratory effort normal. Cardiovascular system: S1 & S2 heard, RRR. No JVD, murmurs, rubs, gallops or clicks. No pedal edema. Gastrointestinal system: Abdomen is  nondistended, soft and nontender. No organomegaly or masses felt. Normal bowel sounds heard. Central nervous system: Alert and oriented. No focal neurological deficits. Extremities: Symmetric 5 x 5 power. Skin: No rashes, lesions or ulcers.  Psychiatry: Judgement and insight appear normal. Mood & affect appropriate.    Data Reviewed: I have personally reviewed following labs and imaging studies  CBC: Recent Labs  Lab 01/16/21 0959 01/17/21 0336 01/18/21 1616 01/19/21 0500  WBC 13.7* 10.2 9.4 8.2  NEUTROABS 11.5*  --  7.1  --   HGB 13.8 12.7* 11.6* 12.0*  HCT 40.2 35.8* 32.6* 33.7*  MCV 91.6 90.2 90.1 89.9  PLT 142* 133* 127* 130*     Basic Metabolic Panel: Recent Labs  Lab 01/16/21 0959 01/17/21 0336 01/18/21 1616 01/19/21 0500  NA 143 142 145 141  K 3.0* 3.5 3.1* 3.5  CL 105 107 115* 109  CO2 27 29 25 28   GLUCOSE 148* 125* 134* 127*  BUN 31* 26* 25* 25*  CREATININE 1.56* 1.29* 1.22 1.17  CALCIUM 9.4 9.1 7.5* 8.6*  MG 2.2  --   --  1.7  PHOS 2.8  --   --   --      GFR: Estimated Creatinine Clearance: 49.5 mL/min (by C-G formula based on SCr of 1.17 mg/dL).  Liver Function Tests: Recent Labs  Lab 01/16/21 0959 01/17/21 0336  AST 19 23  ALT 18 18  ALKPHOS 44 45  BILITOT 1.1 1.8*  PROT 6.5 6.5  ALBUMIN 3.7 3.7     CBG: Recent Labs  Lab 01/18/21 1602 01/19/21 0459 01/20/21 0809  GLUCAP 157* 120* 110*      Recent Results (from the past 240 hour(s))  Resp Panel by RT-PCR (Flu A&B, Covid) Nasopharyngeal Swab     Status: None   Collection Time: 01/16/21  1:39 PM   Specimen: Nasopharyngeal Swab; Nasopharyngeal(NP) swabs in vial transport medium  Result Value Ref Range Status   SARS Coronavirus 2 by RT PCR NEGATIVE NEGATIVE Final    Comment: (NOTE) SARS-CoV-2 target nucleic acids are NOT DETECTED.  The SARS-CoV-2 RNA is generally detectable in upper respiratory specimens during the acute phase of infection. The lowest concentration of  SARS-CoV-2 viral copies this assay can detect is 138 copies/mL. A negative result does not preclude SARS-Cov-2 infection and should not be used as the sole basis for treatment or other patient management decisions. A negative result may occur with  improper specimen collection/handling, submission of specimen other than nasopharyngeal swab, presence of viral mutation(s) within the areas targeted by this assay, and inadequate number of viral copies(<138 copies/mL). A negative result must be combined with clinical observations, patient history, and epidemiological information. The expected result is Negative.  Fact Sheet for Patients:  EntrepreneurPulse.com.au  Fact Sheet for Healthcare Providers:  IncredibleEmployment.be  This test is no t yet approved or cleared by the [place]  States FDA and  has been authorized for detection and/or diagnosis of SARS-CoV-2 by FDA under an Emergency Use Authorization (EUA). This EUA will remain  in effect (meaning this test can be used) for the duration of the COVID-19 declaration under Section 564(b)(1) of the Act, 21 U.S.C.section 360bbb-3(b)(1), unless the authorization is terminated  or revoked sooner.       Influenza A by PCR NEGATIVE NEGATIVE Final   Influenza B by PCR NEGATIVE NEGATIVE Final    Comment: (NOTE) The Xpert Xpress SARS-CoV-2/FLU/RSV plus assay is intended as an aid in the diagnosis of influenza from Nasopharyngeal swab specimens and should not be used as a sole basis for treatment. Nasal washings and aspirates are unacceptable for Xpert Xpress SARS-CoV-2/FLU/RSV testing.  Fact Sheet for Patients: EntrepreneurPulse.com.au  Fact Sheet for Healthcare Providers: IncredibleEmployment.be  This test is not yet approved or cleared by the [place] FDA and has been authorized for detection and/or diagnosis of SARS-CoV-2 by FDA under an Emergency Use  Authorization (EUA). This EUA will remain in effect (meaning this test can be used) for the duration of the COVID-19 declaration under Section 564(b)(1) of the Act, 21 U.S.C. section 360bbb-3(b)(1), unless the authorization is terminated or revoked.  Performed at [facility], [place] [address], [place], [place] [address]         Radiology Studies: VAS [place] LOWER EXTREMITY VENOUS (DVT)  Result Date: 01/19/2021  Lower Venous DVT Study Patient Name:  DANYL DEEMS [facility]  Date of Exam:   01/19/2021 Medical Rec #: [id number]        Accession #:    [id number] Date of Birth: 1940/07/26         Patient Gender: M Patient Age:   80Y Exam Location:  [facility] Procedure:      VAS [place] LOWER EXTREMITY VENOUS (DVT) Referring Phys: [id number] --------------------------------------------------------------------------------  Other Indications: PE dx'd on 7/21 - patient started on Eliquis. 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 Comparison Study: No previous exams Performing Technologist: Jody Hill RVT, RDMS  Examination Guidelines: A complete evaluation includes B-mode imaging, spectral Doppler, color Doppler, and power Doppler as needed of all accessible portions of each vessel. Bilateral testing is considered an integral part of a complete examination. Limited examinations for reoccurring indications may be performed as noted. The reflux portion of the exam is performed with the patient in reverse Trendelenburg.  +---------+---------------+---------+-----------+----------+--------------+ RIGHT    CompressibilityPhasicitySpontaneityPropertiesThrombus Aging +---------+---------------+---------+-----------+----------+--------------+ CFV      Full           Yes      Yes                                 +---------+---------------+---------+-----------+----------+--------------+ SFJ      Full                                                         +---------+---------------+---------+-----------+----------+--------------+ FV Prox  Full           Yes      Yes                                 +---------+---------------+---------+-----------+----------+--------------+ FV Mid  Full           Yes      Yes                                 +---------+---------------+---------+-----------+----------+--------------+ FV DistalFull           Yes      Yes                                 +---------+---------------+---------+-----------+----------+--------------+ PFV      Full                                                        +---------+---------------+---------+-----------+----------+--------------+ POP      Full           Yes      Yes                                 +---------+---------------+---------+-----------+----------+--------------+ PTV      Full                                                        +---------+---------------+---------+-----------+----------+--------------+ PERO     Full                                                        +---------+---------------+---------+-----------+----------+--------------+   +---------+---------------+---------+-----------+----------+--------------+ LEFT     CompressibilityPhasicitySpontaneityPropertiesThrombus Aging +---------+---------------+---------+-----------+----------+--------------+ CFV      Full           Yes      Yes                                 +---------+---------------+---------+-----------+----------+--------------+ SFJ      Full                                                        +---------+---------------+---------+-----------+----------+--------------+ FV Prox  Full           Yes      Yes                                 +---------+---------------+---------+-----------+----------+--------------+ FV Mid   Full           Yes      Yes                                  +---------+---------------+---------+-----------+----------+--------------+ FV DistalFull  Yes      Yes                                 +---------+---------------+---------+-----------+----------+--------------+ PFV      Full                                                        +---------+---------------+---------+-----------+----------+--------------+ POP      Full           Yes      Yes                                 +---------+---------------+---------+-----------+----------+--------------+ PTV      Full                                                        +---------+---------------+---------+-----------+----------+--------------+ PERO     Full                                                        +---------+---------------+---------+-----------+----------+--------------+     Summary: BILATERAL: - No evidence of deep vein thrombosis seen in the lower extremities, bilaterally. - No evidence of superficial venous thrombosis in the lower extremities, bilaterally. - RIGHT: - No cystic structure found in the popliteal fossa.  LEFT: - A cystic structure is found in the popliteal fossa.  *See table(s) above for measurements and observations.    Preliminary      Scheduled Meds:  mometasone-formoterol  2 puff Inhalation BID   multivitamin with minerals  1 tablet Oral Daily   sodium chloride flush  3 mL Intravenous Q12H   tamsulosin  0.4 mg Oral Daily   Vitamin D (Ergocalciferol)  50,000 Units Oral Q7 days   Continuous Infusions:   LOS: 2 days   Time spent: 32 minutes Darliss Cheney, MD [facility]   To contact the attending physician between 7A-7P or the covering provider during after hours 7P-7A, please log into the web site [email or web address] and access using universal [place] password for that web site. If you do not have the password, please call the hospital operator.  01/20/2021, 8:46 AM

## 2021-01-20 NOTE — Telephone Encounter (Signed)
pts wife has called stating she had previously spoke with Dr. Ronnald Ramp about taking on her husband as a new pt. Pt is currently in the hospital and his wife has stated he is in need of an appointment as soon as possible.  Pts wife [name] Setters can be reached at [phone].

## 2021-01-20 NOTE — Progress Notes (Signed)
PT Cancellation Note  Patient Details Name: Alan [name] MRN: [id number] DOB: [birth year]   Cancelled Treatment:     Pt recent admit, still very orthostatic and low BPS. Will await measures ordered and nursing recheck orthostatics.   Did ambulate well recently with last hospitalization on 01/17/2021.    Clide Dales 01/20/2021, 11:29 AM Gatha Mayer, PT, MPT Acute Rehabilitation Services Office: [phone] Pager: [phone] 01/20/2021

## 2021-01-20 NOTE — Progress Notes (Signed)
Initial Nutrition Assessment  DOCUMENTATION CODES:   Not applicable  INTERVENTION:  - will order Carnation Instant Breakfast with 2% milk BID. - will order 30 ml Prosource Plus TID, each supplement provides 100 kcal and 15 grams protein.  - complete NFPE when feasible.    NUTRITION DIAGNOSIS:   Increased nutrient needs related to chronic illness, cancer and cancer related treatments as evidenced by estimated needs.  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 He presented to the ED with worsening pre-syncope. He was recently treated for syncope/fall.  Unable to talk with patient d/t patient on prolonged phone call.   No meal completion documented since admission. CLD was ordered on 7/23 at 2007 and advanced to Regular yesterday at 1534.   He has not been seen by a [place] RD at any time in the past.  Weight today is 150.5 lb, weight on 7/22 was 149 lb, and weight at [facility] on 7/19 was 155 lb. This indicates 4.5 lb weight loss (3% body weight) in the past 1 week; significant for time frame.  I/O flow sheet indicates he is -157 ml since admission. On 7/23 he had 460 ml stool output, yesterday he had 500 ml stool output, and so far today has had 197 ml stool output.   Per notes: - orthostatic hypotension - ABLA, heme-positive stools--no plan for endoscopy; plan for conservative management - pulmonary emboli - carcinoid tumor with lanreotide currently held but likely able to be resumed - frustrated by hospitalization but agreeable to stay overnight today with hope to be d/c on 7/26   Labs reviewed; CBGs: 120 and 110 mg/dl, BUN: 25 mg/dl, Ca: 8.6  mg/dl. Medications reviewed; 1 tablet multivitamin with minerals/day, 50000 units drisdol every 7 days starting 7/25.     NUTRITION - FOCUSED PHYSICAL EXAM:  Unable to complete at this time.   Diet Order:   Diet Order             Diet regular Room service appropriate? Yes; Fluid consistency: Thin  Diet effective now                   EDUCATION NEEDS:   Not appropriate for education at this time  Skin:  Skin Assessment: Reviewed RN Assessment  Last BM:  7/24  Height:   Ht Readings from Last 1 Encounters:  01/19/21 6\' 1"  (1.854 m)    Weight:   Wt Readings from Last 1 Encounters:  01/20/21 68.3 kg     Estimated Nutritional Needs:  Kcal:  2050-2250 kcal Protein:  105-120 grams Fluid:  >/= 2.8 L/day      Jarome Matin, MS, RD, LDN, CNSC Inpatient Clinical Dietitian RD pager # available in AMION  After hours/weekend pager # available in [facility]

## 2021-01-21 DIAGNOSIS — R55 Syncope and collapse: Secondary | ICD-10-CM | POA: Diagnosis not present

## 2021-01-21 LAB — GLUCOSE, CAPILLARY: Glucose-Capillary: 129 mg/dL — ABNORMAL HIGH (ref 70–99)

## 2021-01-21 MED ORDER — MIDODRINE HCL 5 MG PO TABS
5.0000 mg | ORAL_TABLET | Freq: Three times a day (TID) | ORAL | Status: DC
  Administered 2021-01-21 – 2021-01-22 (×5): 5 mg via ORAL
  Filled 2021-01-21 (×5): qty 1

## 2021-01-21 NOTE — Care Management Important Message (Signed)
Important Message  Patient Details IM Letter given to the Patient. Name: Alan [name] MRN: [id number] Date of Birth: 1941/04/30   Medicare Important Message Given:  Yes     Kerin Salen 01/21/2021, 10:23 AM

## 2021-01-21 NOTE — Progress Notes (Signed)
This RN completed orthostatic vital sign for pt. Pt had felt very dizzy after orthostatic standing for 44mins. RN noted that pt BP dropped to 72/44 and assisted pt to sit on bed. He stated that "I never felt that dizzy before". NP Olena Heckle made aware. Will continue to monitor

## 2021-01-21 NOTE — Progress Notes (Signed)
   Starting midodrine 5mg  PO TID with meals. BP dropped to 70's with standing, symptomatic despite conservative measures. Will tolerate hypertension in order to combat low BP at times.   Candee Furbish, MD

## 2021-01-21 NOTE — Progress Notes (Addendum)
Progress Note  Patient Name: Alan [name] Date of Encounter: 01/21/2021  Primary Cardiologist: New   Subjective   Feeling well today. Orthostatics this AM improved but not ideal.   Inpatient Medications    Scheduled Meds:  (feeding supplement) PROSource Plus  30 mL Oral TID BM   midodrine  5 mg Oral TID WC   mometasone-formoterol  2 puff Inhalation BID   multivitamin with minerals  1 tablet Oral Daily   sodium chloride flush  3 mL Intravenous Q12H   tamsulosin  0.4 mg Oral Daily   Vitamin D (Ergocalciferol)  50,000 Units Oral Q7 days   Continuous Infusions:  sodium chloride 50 mL/hr at 01/21/21 0036   PRN Meds: acetaminophen **OR** acetaminophen, albuterol, hydrOXYzine, loperamide, ondansetron **OR** ondansetron (ZOFRAN) IV   Vital Signs    Vitals:   01/20/21 2011 01/20/21 2358 01/21/21 0609 01/21/21 0731  BP: 119/76 133/78 (!) 154/90 (!) 161/82  Pulse: 73  77 61  Resp: 17  16 16   Temp: 98.4 F (36.9 C)  98.1 F (36.7 C) 98.2 F (36.8 C)  TempSrc: Oral  Oral Oral  SpO2: 100%  100% 99%  Weight:   68.1 kg   Height:        Intake/Output Summary (Last 24 hours) at 01/21/2021 0845 Last data filed at 01/21/2021 0610 Gross per 24 hour  Intake 400.53 ml  Output 725 ml  Net -324.47 ml   Filed Weights   01/19/21 1456 01/20/21 0539 01/21/21 0609  Weight: 68.4 kg 68.3 kg 68.1 kg    Physical Exam   General: Well developed, well nourished, NAD Neck: Negative for carotid bruits. No JVD Lungs:Clear to ausculation bilaterally. No wheezes, rales, or rhonchi. Breathing is unlabored. Cardiovascular: RRR with S1 S2. No murmurs Abdomen: Soft, non-tender, non-distended. No obvious abdominal masses. Extremities: No edema. Neuro: Alert and oriented. No focal deficits. No facial asymmetry. MAE spontaneously. Psych: Responds to questions appropriately with normal affect.    Labs    Chemistry Recent Labs  Lab 01/16/21 0959 01/17/21 0336 01/18/21 1616  01/19/21 0500  NA 143 142 145 141  K 3.0* 3.5 3.1* 3.5  CL 105 107 115* 109  CO2 27 29 25 28   GLUCOSE 148* 125* 134* 127*  BUN 31* 26* 25* 25*  CREATININE 1.56* 1.29* 1.22 1.17  CALCIUM 9.4 9.1 7.5* 8.6*  PROT 6.5 6.5  --   --   ALBUMIN 3.7 3.7  --   --   AST 19 23  --   --   ALT 18 18  --   --   ALKPHOS 44 45  --   --   BILITOT 1.1 1.8*  --   --   GFRNONAA 45* 56* >60 >60  ANIONGAP 11 6 5  4*     Hematology Recent Labs  Lab 01/17/21 0336 01/18/21 1616 01/19/21 0500  WBC 10.2 9.4 8.2  RBC 3.97* 3.62* 3.75*  HGB 12.7* 11.6* 12.0*  HCT 35.8* 32.6* 33.7*  MCV 90.2 90.1 89.9  MCH 32.0 32.0 32.0  MCHC 35.5 35.6 35.6  RDW 13.0 13.2 13.1  PLT 133* 127* 130*    Cardiac EnzymesNo results for input(s): TROPONINI in the last 168 hours. No results for input(s): TROPIPOC in the last 168 hours.   BNPNo results for input(s): BNP, PROBNP in the last 168 hours.   DDimer  Recent Labs  Lab 01/16/21 0959  DDIMER 12.54*     Radiology    VAS [place] LOWER  EXTREMITY VENOUS (DVT)  Result Date: 01/20/2021  Lower Venous DVT Study Patient Name:  Alan [name] [facility]  Date of Exam:   01/19/2021 Medical Rec #: [id number]        Accession #:    [id number] Date of Birth: [birth year]         Patient Gender: M Patient Age:   80Y Exam Location:  [facility] Procedure:      VAS [place] LOWER EXTREMITY VENOUS (DVT) Referring Phys: [id number] [employer] [place] --------------------------------------------------------------------------------  Other Indications: PE dx'd on 7/21 - patient started on Eliquis. 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 Comparison Study: No previous exams Performing Technologist: Jody Hill RVT, RDMS  Examination Guidelines: A complete evaluation includes B-mode imaging, spectral Doppler, color Doppler, and power Doppler as needed of all accessible portions of each vessel. Bilateral testing is considered an integral part of a complete examination. Limited examinations for reoccurring  indications may be performed as noted. The reflux portion of the exam is performed with the patient in reverse Trendelenburg.  +---------+---------------+---------+-----------+----------+--------------+ RIGHT    CompressibilityPhasicitySpontaneityPropertiesThrombus Aging +---------+---------------+---------+-----------+----------+--------------+ CFV      Full           Yes      Yes                                 +---------+---------------+---------+-----------+----------+--------------+ SFJ      Full                                                        +---------+---------------+---------+-----------+----------+--------------+ FV Prox  Full           Yes      Yes                                 +---------+---------------+---------+-----------+----------+--------------+ FV Mid   Full           Yes      Yes                                 +---------+---------------+---------+-----------+----------+--------------+ FV DistalFull           Yes      Yes                                 +---------+---------------+---------+-----------+----------+--------------+ PFV      Full                                                        +---------+---------------+---------+-----------+----------+--------------+ POP      Full           Yes      Yes                                 +---------+---------------+---------+-----------+----------+--------------+ PTV      Full                                                        +---------+---------------+---------+-----------+----------+--------------+  PERO     Full                                                        +---------+---------------+---------+-----------+----------+--------------+   +---------+---------------+---------+-----------+----------+--------------+ LEFT     CompressibilityPhasicitySpontaneityPropertiesThrombus Aging  +---------+---------------+---------+-----------+----------+--------------+ CFV      Full           Yes      Yes                                 +---------+---------------+---------+-----------+----------+--------------+ SFJ      Full                                                        +---------+---------------+---------+-----------+----------+--------------+ FV Prox  Full           Yes      Yes                                 +---------+---------------+---------+-----------+----------+--------------+ FV Mid   Full           Yes      Yes                                 +---------+---------------+---------+-----------+----------+--------------+ FV DistalFull           Yes      Yes                                 +---------+---------------+---------+-----------+----------+--------------+ PFV      Full                                                        +---------+---------------+---------+-----------+----------+--------------+ POP      Full           Yes      Yes                                 +---------+---------------+---------+-----------+----------+--------------+ PTV      Full                                                        +---------+---------------+---------+-----------+----------+--------------+ PERO     Full                                                        +---------+---------------+---------+-----------+----------+--------------+  Summary: BILATERAL: - No evidence of deep vein thrombosis seen in the lower extremities, bilaterally. - No evidence of superficial venous thrombosis in the lower extremities, bilaterally. - RIGHT: - No cystic structure found in the popliteal fossa.  LEFT: - A cystic structure is found in the popliteal fossa.  *See table(s) above for measurements and observations. Electronically signed by Ruta Hinds MD on 01/20/2021 at 4:36:40 PM.    Final     Telemetry    01/21/21 SB with HRs in the upper  40's, intermittent PVCs - Personally Reviewed  ECG    No new tracing as of 01/21/21 - Personally Reviewed  Cardiac Studies   Echocardiogram 01/17/21:   1. Left ventricular ejection fraction, by estimation, is 55 to 60%. The  left ventricle has normal function. The left ventricle has no regional  wall motion abnormalities. There is mild left ventricular hypertrophy of  the basal-septal segment. Left  ventricular diastolic parameters were normal.   2. Right ventricular systolic function is normal. The right ventricular  size is normal.   3. The mitral valve is normal in structure. No evidence of mitral valve  regurgitation. No evidence of mitral stenosis.   4. The aortic valve is tricuspid. Aortic valve regurgitation is not  visualized. No aortic stenosis is present.   5. The inferior vena cava is normal in size with greater than 50%  respiratory variability, suggesting right atrial pressure of 3 mmHg.   Patient Profile     80 y.o. 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  [place]    1. Orthostatic hypotension/syncope: -Initially with symptoms 06/2020. Attempted to avoid midodrine or florinef with resting hypertension however due to persistent symptoms Midodrine started at 5mg  PO TID with meals>>follow response -Continue with compression stockings, consider abdominal binder  -Echocardiogram with LVEF at 55-60% with basal-septal hypertrophy, no valvular disease.  -Creatinine at 1.17 today  -Orthostatics today as follows: lying-161/82 HR 61; sitting 138/73 HR 92; standing 90/66 HR 73>>>remains positive however improved from readings from 01/20/21 -May additionally require abdominal binder, higher gradient compression stockings   2. Anemia: -CBC stable, Hb 12.0 today   3. Sinus bradycardia: -HRs in the  upper 40's>>pt reports this is his baseline HR  -Not felt to be contributing to symptoms  -No indication for PPM placement   4. Pulmonary embolus: -Not on AC due to higher risk versus benefit -No evidence of DVT on LE duplex study   5. Malignant carcinoid tumor of the small intestine: -Management per oncology, GI    Signed, Kathyrn Drown NP-C [facility] Pager: [phone] 01/21/2021, 8:45 AM     For questions or updates, please contact   Please consult [email or web address] for contact info under Cardiology/STEMI.  Personally seen and examined. Agree with above.  Static hypotension - Still quite challenging to control.  Likely driven by carcinoid vasodilatation. - Since conservative measures are not optimal, still seeing blood pressures in the 70s upon standing, we started midodrine 5 mg 3 times daily with meals.  We can always increase this to 10 mg if necessary.  We also can add Florinef if necessary.  I am willing to tolerate some episodes of hypertension given the severely low blood pressure and symptoms as well as near syncope. - I am also going to discontinue the tamsulosin or  Flomax which can potentiate orthostasis.  Discussed with him.  Sinus bradycardia - At times in the 40s especially during sleep.  This is not contributing to his vasodepression.  I think it is reasonable to continue with his current carcinoid treatment regimen.  Candee Furbish, MD

## 2021-01-21 NOTE — Progress Notes (Signed)
PROGRESS NOTE    Alan [name]  GLO:[id number] DOB: [birth year] DOA: 01/18/2021 PCP: Vincente Liberty, MD   Chief Complaint  Patient presents with   Weakness    Brief Narrative:  Alan [name] is a 80 yo w/PMHx of Carcinoid tumor, HTN, CKD who presented with worsening presyncope. Recently treated for syncope/fall and found to have small Pes, for which he was started on anticoagulation.   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 Suspect that small PEs were incidental findings related to malignancy and not the cause of syncope.  Patient reports he does not drink much fluid at home, counseled on aggressive hydration.  His orthostatic hypotension is likely secondary to carcinoid tumor itself. -So far we were avoiding midodrine or fludro given HTN intermittently.  As recommended by cardiology as well however patient still remains orthostatic positive despite of compression stockings so cardiology has now started this patient on midodrine 5 mg 3 times daily.  Next day would be to increase the dose to 10 mg 3 times daily and then starting Florinef as backup, per my discussion with Dr. Marlou Porch.  Acute blood loss anemia Heme positive stools Mild drop in Hgb but no melena.  Likely mucosal bleeding as GI has outlined in their note.  Currently plan to manage conservatively with no endoscopies.  GI signed off.  No anticoagulation.  Sinus Bradycardia - low suspicion for this contributing to syncope especially given onset after prior episodes of syncope - low HR baseline and no hypotension - no indication for PPM, Cardiology following.    Pulmonary emboli CTPE 7/21: Tiny right middle lobe pulmonary emboli are seen - Would not continue anticoagulation as risk outweighs benefit - LE duplex w/o DVT, notable for popliteal/Baker's cyst (no symptoms reported).  No indication of IVC filter.  Carcinoid Tumor - holding Lanreotide; can likely resume  DVT prophylaxis: hold anticoagulation, scd Code Status: full code Family Communication: discussed with patient Disposition:   Status is: Inpatient  Remains inpatient appropriate because:Ongoing diagnostic testing needed not appropriate for outpatient work up  Dispo: The patient is from: Home              Anticipated d/c is to: Home              Patient currently is not medically stable to d/c.   Difficult to place patient No    Consultants:  GI  Procedures:  N/A  Antimicrobials: N/A    Subjective: Seen and examined.  No new complaint.  Still remains frustrated for being in the hospital and not seeing the results of the measures that we are doing to improve his orthostatic hypotension.  Patient has so many questions which he tends to repeat every day despite of getting the answers to every day.    Objective: Vitals:   01/20/21 2358 01/21/21 0609 01/21/21 0731 01/21/21 1246  BP: 133/78 (!) 154/90 (!) 161/82 (!) 146/70  Pulse:  77 61 60  Resp:  16 16 16   Temp:  98.1 F (36.7 C) 98.2 F (36.8 C) 98.6 F (37 C)  TempSrc:  Oral Oral Oral  SpO2:  100% 99% 100%  Weight:  68.1 kg    Height:        Intake/Output Summary (Last 24 hours) at 01/21/2021 1343 Last data filed at 01/21/2021 1038 Gross per 24 hour  Intake 637.53 ml  Output 525 ml  Net 112.53 ml    Filed Weights   01/19/21 1456 01/20/21 0539 01/21/21 0609  Weight: 68.4 kg 68.3 kg 68.1 kg    Examination:  General exam: Appears calm and comfortable  Respiratory system: Clear to auscultation. Respiratory effort normal. Cardiovascular system: S1 & S2 heard, RRR. No JVD, murmurs, rubs, gallops or  clicks. No pedal edema. Gastrointestinal system: Abdomen is nondistended, soft and nontender. No organomegaly or masses felt. Normal bowel sounds heard. Central nervous system: Alert and oriented. No focal neurological deficits. Extremities: Symmetric 5 x 5 power. Skin: No rashes, lesions or ulcers.  Psychiatry: Judgement and insight appear normal. Mood & affect appropriate.   Data Reviewed: I have personally reviewed following labs and imaging studies  CBC: Recent Labs  Lab 01/16/21 0959 01/17/21 0336 01/18/21 1616 01/19/21 0500  WBC 13.7* 10.2 9.4 8.2  NEUTROABS 11.5*  --  7.1  --   HGB 13.8 12.7* 11.6* 12.0*  HCT 40.2 35.8* 32.6* 33.7*  MCV 91.6 90.2 90.1 89.9  PLT 142* 133* 127* 130*     Basic Metabolic Panel: Recent Labs  Lab 01/16/21 0959 01/17/21 0336 01/18/21 1616 01/19/21 0500  NA 143 142 145 141  K 3.0* 3.5 3.1* 3.5  CL 105 107 115* 109  CO2 27 29 25 28   GLUCOSE 148* 125* 134* 127*  BUN 31* 26* 25* 25*  CREATININE 1.56* 1.29* 1.22 1.17  CALCIUM 9.4 9.1 7.5* 8.6*  MG 2.2  --   --  1.7  PHOS 2.8  --   --   --      GFR: Estimated Creatinine Clearance: 49.3 mL/min (by C-G formula based on SCr of 1.17 mg/dL).  Liver Function Tests: Recent Labs  Lab 01/16/21 0959 01/17/21 0336  AST 19 23  ALT 18 18  ALKPHOS 44 45  BILITOT 1.1 1.8*  PROT 6.5 6.5  ALBUMIN 3.7 3.7     CBG: Recent Labs  Lab 01/18/21 1602 01/19/21 0459 01/20/21 0809 01/21/21 0730  GLUCAP 157* 120* 110* 129*      Recent Results (from the past 240 hour(s))  Resp Panel by RT-PCR (Flu A&B, Covid) Nasopharyngeal Swab     Status: None   Collection Time: 01/16/21  1:39 PM   Specimen: Nasopharyngeal Swab; Nasopharyngeal(NP) swabs in vial transport medium  Result Value Ref Range Status   SARS Coronavirus 2 by RT PCR NEGATIVE NEGATIVE Final    Comment: (NOTE) SARS-CoV-2 target nucleic acids are NOT DETECTED.  The SARS-CoV-2 RNA is generally detectable in upper  respiratory specimens during the acute phase of infection. The lowest concentration of SARS-CoV-2 viral copies this assay can detect is 138 copies/mL. A negative result does not preclude SARS-Cov-2 infection and should not be used as the sole basis for treatment or other patient management decisions. A negative result may occur with  improper specimen collection/handling, submission of specimen other than nasopharyngeal swab, presence of viral mutation(s) within the areas targeted by this assay, and inadequate number of viral copies(<138 copies/mL). A negative result must be combined with clinical observations, patient history, and epidemiological information. The expected result is Negative.  Fact Sheet for Patients:  EntrepreneurPulse.com.au  Fact Sheet for Healthcare Providers:  IncredibleEmployment.be  This test is no t yet approved or cleared by  the [employer] and  has been authorized for detection and/or diagnosis of SARS-CoV-2 by FDA under an Emergency Use Authorization (EUA). This EUA will remain  in effect (meaning this test can be used) for the duration of the COVID-19 declaration under Section 564(b)(1) of the Act, 21 U.S.C.section 360bbb-3(b)(1), unless the authorization is terminated  or revoked sooner.       Influenza A by PCR NEGATIVE NEGATIVE Final   Influenza B by PCR NEGATIVE NEGATIVE Final    Comment: (NOTE) The Xpert Xpress SARS-CoV-2/FLU/RSV plus assay is intended as an aid in the diagnosis of influenza from Nasopharyngeal swab specimens and should not be used as a sole basis for treatment. Nasal washings and aspirates are unacceptable for Xpert Xpress SARS-CoV-2/FLU/RSV testing.  Fact Sheet for Patients: EntrepreneurPulse.com.au  Fact Sheet for Healthcare Providers: IncredibleEmployment.be  This test is not yet approved or cleared by the [place] FDA and has been  authorized for detection and/or diagnosis of SARS-CoV-2 by FDA under an Emergency Use Authorization (EUA). This EUA will remain in effect (meaning this test can be used) for the duration of the COVID-19 declaration under Section 564(b)(1) of the Act, 21 U.S.C. section 360bbb-3(b)(1), unless the authorization is terminated or revoked.  Performed at [facility], [place] [address] [place], [place] [address]         Radiology Studies: No results found.   Scheduled Meds:  (feeding supplement) PROSource Plus  30 mL Oral TID BM   midodrine  5 mg Oral TID WC   mometasone-formoterol  2 puff Inhalation BID   multivitamin with minerals  1 tablet Oral Daily   sodium chloride flush  3 mL Intravenous Q12H   Vitamin D (Ergocalciferol)  50,000 Units Oral Q7 days   Continuous Infusions:  sodium chloride 50 mL/hr at 01/21/21 0036     LOS: 3 days   Time spent: 30 minutes  Darliss Cheney, MD [facility]   To contact the attending physician between 7A-7P or the covering provider during after hours 7P-7A, please log into the web site [email or web address] and access using universal [place] password for that web site. If you do not have the password, please call the hospital operator.  01/21/2021, 1:43 PM

## 2021-01-21 NOTE — Progress Notes (Signed)
PT Cancellation Note  Patient Details Name: Alan [name] MRN: [id number] DOB: 08/14/1940   Cancelled Treatment:    Reason Eval/Treat Not Completed: Medical issues which prohibited therapy RN reports pt orthostatic again this morning and started on meds.  RN requests hold therapy today.  Will check back as schedule permits.   Rosilyn Coachman,KATHrine E 01/21/2021, 11:08 AM Arlyce Dice, DPT Acute Rehabilitation Services Pager: [phone] Office: [phone]

## 2021-01-22 DIAGNOSIS — R55 Syncope and collapse: Secondary | ICD-10-CM | POA: Diagnosis not present

## 2021-01-22 DIAGNOSIS — E876 Hypokalemia: Secondary | ICD-10-CM

## 2021-01-22 DIAGNOSIS — I10 Essential (primary) hypertension: Secondary | ICD-10-CM

## 2021-01-22 DIAGNOSIS — R001 Bradycardia, unspecified: Secondary | ICD-10-CM

## 2021-01-22 DIAGNOSIS — N1831 Chronic kidney disease, stage 3a: Secondary | ICD-10-CM

## 2021-01-22 DIAGNOSIS — C7A019 Malignant carcinoid tumor of the small intestine, unspecified portion: Secondary | ICD-10-CM

## 2021-01-22 DIAGNOSIS — I2699 Other pulmonary embolism without acute cor pulmonale: Secondary | ICD-10-CM

## 2021-01-22 DIAGNOSIS — Z23 Encounter for immunization: Secondary | ICD-10-CM | POA: Diagnosis present

## 2021-01-22 DIAGNOSIS — K529 Noninfective gastroenteritis and colitis, unspecified: Secondary | ICD-10-CM

## 2021-01-22 LAB — COMPREHENSIVE METABOLIC PANEL
ALT: 28 U/L (ref 0–44)
AST: 31 U/L (ref 15–41)
Albumin: 3.1 g/dL — ABNORMAL LOW (ref 3.5–5.0)
Alkaline Phosphatase: 40 U/L (ref 38–126)
Anion gap: 7 (ref 5–15)
BUN: 26 mg/dL — ABNORMAL HIGH (ref 8–23)
CO2: 25 mmol/L (ref 22–32)
Calcium: 8.6 mg/dL — ABNORMAL LOW (ref 8.9–10.3)
Chloride: 107 mmol/L (ref 98–111)
Creatinine, Ser: 1.29 mg/dL — ABNORMAL HIGH (ref 0.61–1.24)
GFR, Estimated: 56 mL/min — ABNORMAL LOW (ref >60)
Glucose, Bld: 116 mg/dL — ABNORMAL HIGH (ref 70–99)
Potassium: 3.7 mmol/L (ref 3.5–5.1)
Sodium: 139 mmol/L (ref 135–145)
Total Bilirubin: 1 mg/dL (ref 0.3–1.2)
Total Protein: 6 g/dL — ABNORMAL LOW (ref 6.5–8.1)

## 2021-01-22 LAB — GLUCOSE, CAPILLARY: Glucose-Capillary: 133 mg/dL — ABNORMAL HIGH (ref 70–99)

## 2021-01-22 LAB — CBC WITH DIFFERENTIAL/PLATELET
Abs Immature Granulocytes: 0.08 10*3/uL — ABNORMAL HIGH (ref 0.00–0.07)
Basophils Absolute: 0.1 10*3/uL (ref 0.0–0.1)
Basophils Relative: 1 %
Eosinophils Absolute: 0.3 10*3/uL (ref 0.0–0.5)
Eosinophils Relative: 3 %
HCT: 35.7 % — ABNORMAL LOW (ref 39.0–52.0)
Hemoglobin: 12.7 g/dL — ABNORMAL LOW (ref 13.0–17.0)
Immature Granulocytes: 1 %
Lymphocytes Relative: 25 %
Lymphs Abs: 2.1 10*3/uL (ref 0.7–4.0)
MCH: 31.6 pg (ref 26.0–34.0)
MCHC: 35.6 g/dL (ref 30.0–36.0)
MCV: 88.8 fL (ref 80.0–100.0)
Monocytes Absolute: 0.8 10*3/uL (ref 0.1–1.0)
Monocytes Relative: 10 %
Neutro Abs: 5.3 10*3/uL (ref 1.7–7.7)
Neutrophils Relative %: 60 %
Platelets: 138 10*3/uL — ABNORMAL LOW (ref 150–400)
RBC: 4.02 MIL/uL — ABNORMAL LOW (ref 4.22–5.81)
RDW: 12.6 % (ref 11.5–15.5)
WBC: 8.6 10*3/uL (ref 4.0–10.5)
nRBC: 0 % (ref 0.0–0.2)

## 2021-01-22 LAB — MAGNESIUM: Magnesium: 1.5 mg/dL — ABNORMAL LOW (ref 1.7–2.4)

## 2021-01-22 LAB — PHOSPHORUS: Phosphorus: 2.6 mg/dL (ref 2.5–4.6)

## 2021-01-22 MED ORDER — HYDROXYZINE HCL 10 MG PO TABS: 10.0000 mg | ORAL_TABLET | Freq: Three times a day (TID) | ORAL | 0 refills | Status: DC | PRN

## 2021-01-22 MED ORDER — ACETAMINOPHEN 325 MG PO TABS: 650.0000 mg | ORAL_TABLET | Freq: Four times a day (QID) | ORAL | 0 refills | Status: DC | PRN

## 2021-01-22 MED ORDER — ONDANSETRON HCL 4 MG PO TABS: 4.0000 mg | ORAL_TABLET | Freq: Four times a day (QID) | ORAL | 0 refills | Status: DC | PRN

## 2021-01-22 MED ORDER — PROSOURCE PLUS PO LIQD: 30.0000 mL | Freq: Three times a day (TID) | ORAL | 0 refills | Status: DC

## 2021-01-22 MED ORDER — MIDODRINE HCL 5 MG PO TABS: 5.0000 mg | ORAL_TABLET | Freq: Three times a day (TID) | ORAL | 0 refills | Status: DC

## 2021-01-22 NOTE — Progress Notes (Signed)
Reviewed all dc instructions with pt and caregiver including follow up appointments and medications. Both pt and caregiver verbalized understanding. Jahne Krukowski, Laurel Dimmer, RN

## 2021-01-22 NOTE — TOC Transition Note (Signed)
Transition of Care [facility]) - CM/SW Discharge Note   Patient Details  Name: Alan [name] MRN: [id number] Date of Birth: April 15, 1941  Transition of [facility]) CM/SW Contact:  Dessa Phi, RN Phone Number: 01/22/2021, 3:57 PM   Clinical Narrative: [name] chosen for HHPT/OT-rep Tommi Rumps able to accept.rw has already been exchanged from prior order. No further CM needs.      Final next level of care: [place] Barriers to Discharge: No Barriers Identified   Patient Goals and CMS Choice        Discharge Placement                       Discharge Plan and Services   Discharge Planning Services: CM Consult Post Acute Care Choice: Home Health, Durable Medical Equipment                    HH Arranged: OT, PT [facility] Agency: [place] Date [facility] Agency Contacted: 01/22/21 Time [place]: 7998 Representative spoke with at [place]: [place] ([place]) Interventions     Readmission Risk Interventions No flowsheet data found.

## 2021-01-22 NOTE — Plan of Care (Signed)
  Problem: Education: °Goal: Knowledge of General Education information will improve °Description: Including pain rating scale, medication(s)/side effects and non-pharmacologic comfort measures °Outcome: Completed/Met °  °Problem: Activity: °Goal: Risk for activity intolerance will decrease °Outcome: Completed/Met °  °Problem: Nutrition: °Goal: Adequate nutrition will be maintained °Outcome: Completed/Met °  °Problem: Pain Managment: °Goal: General experience of comfort will improve °Outcome: Completed/Met °  °Problem: Safety: °Goal: Ability to remain free from injury will improve °Outcome: Completed/Met °  °

## 2021-01-22 NOTE — Evaluation (Signed)
Physical Therapy Evaluation Patient Details Name: Alan [name] MRN: [id number] DOB: 1941-02-10 Today's Date: 01/22/2021   History of Present Illness  Pt is 80 y.o. male recently readmitted for recurrent near sycopal episodes admitted 7/21presented to ED with weakness,near syncopal episode, and SOB found to have acute pulmonary emboli involving Rt middle lobe . PMH significant for malignant carcinoid tumor, HTN, BPH.  Clinical Impression  Pt is a 80 y.o. male with above HPI. Pt is recently readmitted for recurrence of near syncopal episodes. Pt ambulated ~213ft with MIN guard progressing to supervision for safety with use of RW. Pt denied any onset of symptoms throughout duration of session (orthostatic vitals taken prior to ambulation provided below.) Pt's BP following ambulation increased to 112/66 while sitting EOB. BP at EOS in supine 146/86. PT discussed fall prevention strategies, BP checks at home, and use of reclined/supine/ flat positions in case of symptom onset to assist with promotion of increased BP in event of orthostasis. Pt presents below independent baseline mobility with use of assistive device due to balance deficits at this time. Pt will benefit from continued skilled PT to increase their independence and maximize safety with mobility.     01/22/21 1315  Orthostatic Lying   BP- Lying 138/80  Pulse- Lying (!) 46  Orthostatic Sitting  BP- Sitting 120/74  Pulse- Sitting 51  Orthostatic Standing at 0 minutes  BP- Standing at 0 minutes 106/70  Pulse- Standing at 0 minutes 63  Orthostatic Standing at 3 minutes  BP- Standing at 3 minutes 102/61       Follow Up Recommendations Home health PT    Equipment Recommendations   (pt owns RW from last admit but reports it is too wide to fit through doors at home)    Recommendations for Other Services       Precautions / Restrictions Precautions Precautions: Fall Restrictions Weight Bearing Restrictions: No       Mobility  Bed Mobility Overal bed mobility: Modified Independent             General bed mobility comments: supine to sit with HOB elevated and slightly increased time    Transfers Overall transfer level: Needs assistance Equipment used: Rolling walker (2 wheeled) Transfers: Sit to/from Stand Sit to Stand: Supervision         General transfer comment: supervision for safety. Pt demonstrated safe hand placement  Ambulation/Gait Ambulation/Gait assistance: Min guard;Supervision Gait Distance (Feet): 220 Feet Assistive device: Rolling walker (2 wheeled) Gait Pattern/deviations: Step-through pattern Gait velocity: decr   General Gait Details: MIN guard progressing to supervision for safety with cues to maintain close proximity to RW and for upright posture, no LOB observed. Pt denied any onset of symptoms during ambulation.  Stairs            Wheelchair Mobility    Modified Rankin (Stroke Patients Only)       Balance Overall balance assessment: Needs assistance Sitting-balance support: Feet supported Sitting balance-Leahy Scale: Good     Standing balance support: Bilateral upper extremity supported Standing balance-Leahy Scale: Fair Standing balance comment: Pt with fair static standing balance, use of RW for improved stability with ambulation                             Pertinent Vitals/Pain Pain Assessment: No/denies pain    Home Living Family/patient expects to be discharged to:: Private residence Living Arrangements: Spouse/significant other Available Help at Discharge: Family Type of  Home: House Home Access: Stairs to enter Entrance Stairs-Rails: Can reach both Entrance Stairs-Number of Steps: 5 Home Layout: One level Home Equipment: Cane - single point;Shower seat - built in;Grab bars - toilet;Hand held shower head;Bedside commode Additional Comments: Pt reports (I) with all ADLs and mobility. He lives with his wife and son lives  close and can provide intermittent assist if needed. Pt is able to enter house usingback door with no stairs to enter at home.    Prior Function Level of Independence: Independent               Hand Dominance   Dominant Hand: Right    Extremity/Trunk Assessment   Upper Extremity Assessment Upper Extremity Assessment: Overall WFL for tasks assessed    Lower Extremity Assessment Lower Extremity Assessment: Generalized weakness    Cervical / Trunk Assessment Cervical / Trunk Assessment: Kyphotic  Communication   Communication: No difficulties  Cognition Arousal/Alertness: Awake/alert Behavior During Therapy: WFL for tasks assessed/performed Overall Cognitive Status: Within Functional Limits for tasks assessed                                        General Comments General comments (skin integrity, edema, etc.): PT educated pt and his wife on use of their BSC to provide grab bars to allow power up for sit to stand from toilet at home. PT discussed fall prevention strategies, BP checks at home, and use of reclined/supine/ flat position in case of symptom onset to assist with promotion of increased BP in event of orthostasis.    Exercises     Assessment/Plan    PT Assessment Patient needs continued PT services  PT Problem List         PT Treatment Interventions DME instruction;Gait training;Stair training;Functional mobility training;Therapeutic activities;Therapeutic exercise;Patient/family education;Balance training    PT Goals (Current goals can be found in the Care Plan section)  Acute Rehab PT Goals Patient Stated Goal: go home today PT Goal Formulation: With patient/family Time For Goal Achievement: 02/05/21 Potential to Achieve Goals: Good    Frequency Min 3X/week   Barriers to discharge        Co-evaluation               AM-PAC PT "6 Clicks" Mobility  Outcome Measure Help needed turning from your back to your side while in a  flat bed without using bedrails?: A Little Help needed moving from lying on your back to sitting on the side of a flat bed without using bedrails?: A Little Help needed moving to and from a bed to a chair (including a wheelchair)?: A Little Help needed standing up from a chair using your arms (e.g., wheelchair or bedside chair)?: A Little Help needed to walk in hospital room?: A Little Help needed climbing 3-5 steps with a railing? : A Lot 6 Click Score: 17    End of Session Equipment Utilized During Treatment: Gait belt Activity Tolerance: Patient tolerated treatment well Patient left: in bed;with call bell/phone within reach;with family/visitor present Nurse Communication: Mobility status PT Visit Diagnosis: Unsteadiness on feet (R26.81);Muscle weakness (generalized) (M62.81)    Time: 5170-0174 PT Time Calculation (min) (ACUTE ONLY): 40 min   Charges:   PT Evaluation $PT Eval Low Complexity: 1 Low PT Treatments $Therapeutic Activity: 23-37 mins      Festus Barren PT, DPT  Acute Rehabilitation Services  Office [phone]  01/22/2021, 4:49 PM

## 2021-01-22 NOTE — Discharge Summary (Signed)
Physician Discharge Summary  [name] [place]on UXL:[id number] DOB: [birth year] DOA: 01/18/2021  PCP: Vincente Liberty, MD  Admit date: 01/18/2021 Discharge date: 01/22/2021  Admitted From: Home Disposition: Home with Home Health PT/OT   Recommendations for Outpatient Follow-up:  Follow up with PCP in 1-2 weeks Follow up with Cardiology within 1-2 weeks  Please obtain CMP/CBC, Mag, Phos in one week Please follow up on the following pending results:  Home Health: YES  Equipment/Devices: Rolling Walker   Discharge Condition:Stable   CODE STATUS: FULL CODE  Diet recommendation:   Brief/Interim Summary: Alan [name] is a 80 yo w/PMHx of Carcinoid tumor, HTN, CKD who presented with worsening presyncope. Recently treated for syncope/fall and found to have small Pes, for which he was started on anticoagulation.  He was subsequently removed off of anticoagulation given his drop in hemoglobin to and mucosal bleeding that GI outlined in their note.  GI recommended weighing the risks and benefits of anticoagulation in case was discussed with cardiology and GI who feel that the risk of anticoagulation was too high with his bleeding so he was removed off his Eliquis given that his PEs were small incidental findings related to the malignancy and not related to the cause of his syncope.  Patient had orthostatic hypotension throughout his hospitalization and cardiology was consulted and they started the patient on midodrine and continued on his compression stockings.  Patient had repeat orthostatic vital signs which showed that he did drop his blood pressure but was not symptomatic.  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 Suspect that small PEs were incidental findings related to malignancy and not the cause of syncope. -Patient reports he does not drink much fluid at home, counseled on aggressive hydration.  His orthostatic hypotension is likely secondary to carcinoid tumor itself. -Initially was avoiding midodrine given HTN intermittently.  As recommended by cardiology as well however patient still remains orthostatic positive despite of compression stockings so cardiology has now started this patient on midodrine 5 mg 3 times daily.   -His orthostatics improved but he still dropped his blood pressure but was not symptomatic.  Cardiology recommended discharging home with home health therapy and will follow up in outpatient setting for further titration and his tamsulosin was discontinued   Acute blood loss anemia Heme positive stools -Mild drop in Hgb but no melena.  Likely mucosal bleeding as GI has outlined in their note.  Currently plan to manage conservatively with no endoscopies.  GI signed off.  No anticoagulation. -Patient's hemoglobin/hematocrit at discharge was 12.7/37.7 and stable   Sinus Bradycardia - low suspicion for this contributing to syncope especially given onset after prior episodes of syncope - low HR baseline and no hypotension; his heart rates are low when he is sleeping but improved when he is active - no indication for PPM, Cardiology following and recommending outpatient follow-up   Pulmonary emboli CTPE 7/21: Tiny right middle lobe pulmonary emboli are seen - Would not continue anticoagulation as risk outweighs benefit - LE duplex w/o  DVT, notable for popliteal/Baker's cyst (no symptoms reported).  No indication of IVC filter.   Carcinoid Tumor - holding Lanreotide; can likely  resume  Hypomagnesemia -Patient magnesium level is 1.5 -Replete with IV mag sulfate prior to discharge -follow-up in outpatient setting repeat magnesium level in 1 to 2 weeks  Thrombocytopenia -Patient platelet count is 130 and improved to 138  -continue monitor for signs or symptoms bleeding; no further bleeding noted -Repeat CBC within 1 week   CKD stage III A -Stable.  Patient's BUN/creatinine went from 26/1.29 -> 25/1.17 -> 26/1.29 -Avoid further nephrotoxic medications, contrast dyes and hypotension renally adjust medications and follow-up with PCP in outpatient setting  Discharge Instructions  Discharge Instructions     Call MD for:  difficulty breathing, headache or visual disturbances   Complete by: As directed    Call MD for:  extreme fatigue   Complete by: As directed    Call MD for:  hives   Complete by: As directed    Call MD for:  persistant dizziness or light-headedness   Complete by: As directed    Call MD for:  persistant nausea and vomiting   Complete by: As directed    Call MD for:  redness, tenderness, or signs of infection (pain, swelling, redness, odor or green/yellow discharge around incision site)   Complete by: As directed    Call MD for:  severe uncontrolled pain   Complete by: As directed    Call MD for:  temperature >100.4   Complete by: As directed    Diet - low sodium heart healthy   Complete by: As directed    Discharge instructions   Complete by: As directed    You were cared for by a hospitalist during your hospital stay. If you have any questions about your discharge medications or the care you received while you were in the hospital after you are discharged, you can call the unit and ask to speak with the hospitalist on call if the hospitalist that took care of you is not available. Once you are discharged, your primary care physician will handle any further medical issues. Please note that NO REFILLS for any discharge medications will be  authorized once you are discharged, as it is imperative that you return to your primary care physician (or establish a relationship with a primary care physician if you do not have one) for your aftercare needs so that they can reassess your need for medications and monitor your lab values.  Follow up with PCP, Cardiology, Gastroenterology, and Medical Oncology as an outpatient.  Take all medications as prescribed. If symptoms change or worsen please return to the ED for evaluation   Driving Restrictions   Complete by: As directed    Patient cannot drive until evaluated by PCP and Cleared by Cardiology in the outpatient setting   Increase activity slowly   Complete by: As directed       Allergies as of 01/22/2021       Reactions   Penicillins Hives, Shortness Of Breath   Lisinopril Itching        Medication List     STOP taking these medications    Apixaban Starter Pack (10mg  and 5mg ) Commonly known as: ELIQUIS STARTER PACK   tamsulosin 0.4 MG Caps capsule Commonly known as: FLOMAX       TAKE these medications    (feeding supplement) PROSource Plus liquid Take 30 mLs by mouth 3 (three) times daily between meals.  acetaminophen 325 MG tablet Commonly known as: TYLENOL Take 2 tablets (650 mg total) by mouth every 6 (six) hours as needed for mild pain (or Fever >/= 101).   albuterol 108 (90 Base) MCG/ACT inhaler Commonly known as: VENTOLIN HFA Inhale 2 puffs into the lungs every 4 (four) hours as needed for shortness of breath.   hydrOXYzine 10 MG tablet Commonly known as: ATARAX/VISTARIL Take 1 tablet (10 mg total) by mouth 3 (three) times daily as needed for anxiety.   midodrine 5 MG tablet Commonly known as: PROAMATINE Take 1 tablet (5 mg total) by mouth 3 (three) times daily with meals.   multivitamin with minerals Tabs tablet Take 1 tablet by mouth daily.   ondansetron 4 MG tablet Commonly known as: ZOFRAN Take 1 tablet (4 mg total) by mouth every 6 (six)  hours as needed for nausea.   PRESERVISION AREDS 2 PO Take 1 capsule by mouth daily.   Symbicort 160-4.5 MCG/ACT inhaler Generic drug: budesonide-formoterol Inhale 2 puffs into the lungs at bedtime.   Vitamin D (Ergocalciferol) 1.25 MG (50000 UNIT) Caps capsule Commonly known as: DRISDOL Take 50,000 Units by mouth every 7 (seven) days.        Follow-up Information     Jerline Pain, MD Follow up.   Specialty: Cardiology Why: cardiology scheduler will contact you for follow up, please give [place] a call if you do not hear from our scheduler in 3 business days. Contact information: [address] [address] [phone]         [facility] Follow up.   Specialty: Home Health Services Why: [place] [facility] therpay/occupational therapy Contact information: [address] [place] [address] [phone]                Allergies  Allergen Reactions   Penicillins Hives and Shortness Of Breath   Lisinopril Itching    Consultations: Cardiology  Procedures/Studies: DG Chest 2 View  Result Date: 01/16/2021 CLINICAL DATA:  Recent fall today with left hip pain, initial encounter EXAM: CHEST - 2 VIEW COMPARISON:  07/23/2020 FINDINGS: Cardiac shadow is stable. Aortic calcifications are noted. The lungs are well aerated bilaterally. No focal infiltrate or effusion is seen. No bony abnormality is noted. Skin fold is noted over the left chest. IMPRESSION: No active cardiopulmonary disease. Electronically Signed   By: Inez Catalina M.D.   On: 01/16/2021 09:26   DG Knee 1-2 Views Right  Result Date: 01/16/2021 CLINICAL DATA:  Weakness, fell, metastatic liver cancer EXAM: RIGHT KNEE - 1-2 VIEW COMPARISON:  None. FINDINGS: Frontal and lateral views of the right knee are obtained. No fracture, subluxation, or dislocation. Moderate 3 compartmental osteoarthritis greatest in the lateral compartment. No joint effusion. Diffuse vascular  calcifications. IMPRESSION: 1. Osteoarthritis.  No acute fracture. Electronically Signed   By: Randa Ngo M.D.   On: 01/16/2021 16:24   CT Head Wo Contrast  Result Date: 01/16/2021 CLINICAL DATA:  Minor head trauma.  History of metastatic disease. EXAM: CT HEAD WITHOUT CONTRAST TECHNIQUE: Contiguous axial images were obtained from the base of the skull through the vertex without intravenous contrast. COMPARISON:  None. FINDINGS: Brain: No evidence of acute infarction, hemorrhage, hydrocephalus, extra-axial collection or mass lesion/mass effect. Vascular: No hyperdense vessel or unexpected calcification. Skull: Normal. Negative for fracture or focal lesion. Sinuses/Orbits: No acute finding. IMPRESSION: Negative head CT. Electronically Signed   By: Monte Fantasia M.D.   On: 01/16/2021 09:34  CT Angio Chest PE W and/or Wo Contrast  Result Date: 01/16/2021 CLINICAL DATA:  Weakness and shortness of breath EXAM: CT ANGIOGRAPHY CHEST WITH CONTRAST TECHNIQUE: Multidetector CT imaging of the chest was performed using the standard protocol during bolus administration of intravenous contrast. Multiplanar CT image reconstructions and MIPs were obtained to evaluate the vascular anatomy. CONTRAST:  46mL OMNIPAQUE IOHEXOL 350 MG/ML SOLN COMPARISON:  04/17/2010 FINDINGS: Cardiovascular: Atherosclerotic calcifications of the aorta are noted. No significant aortic opacification is identified to suggest dissection. No aneurysmal dilatation is noted. No cardiac enlargement is seen. Scattered coronary calcifications are noted. The pulmonary artery shows a normal branching pattern bilaterally. Tiny right middle lobe pulmonary emboli are seen. No other significant emboli are noted. No right heart strain is seen. Mediastinum/Nodes: Thoracic inlet is within normal limits. No sizable hilar or mediastinal adenopathy is noted. The esophagus demonstrates some retained fluid and food stuffs. This may be related to reflux.  Lungs/Pleura: Lungs are well aerated bilaterally. Minimal dependent atelectatic changes are seen. No focal infiltrate or effusion is noted. No evidence of metastatic disease is seen. Upper Abdomen: Visualized upper abdomen shows renal cystic change on the left as well as cysts within the left lobe of the liver. No other focal abnormality in the upper abdomen is seen. Musculoskeletal: Degenerative changes of the thoracic spine are Review of the MIP images confirms the above findings. IMPRESSION: Small pulmonary emboli involving the right middle lobe as described. 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 Aortic Atherosclerosis (ICD10-I70.0). Electronically Signed   By: Inez Catalina M.D.   On: 01/16/2021 12:35   ECHOCARDIOGRAM COMPLETE  Result Date: 01/17/2021    ECHOCARDIOGRAM REPORT   Patient Name:   Alan [name] [facility] Date of Exam: 01/17/2021 Medical Rec #:  [id number]       Height:       74.0 in Accession #:    [id number]      Weight:       149.2 lb Date of Birth:  Aug 31, 1940        BSA:          1.919 m Patient Age:    80 years        BP:           109/71 mmHg Patient Gender: M               HR:           49 bpm. Exam Location:  Inpatient Procedure: 2D Echo, Color Doppler and Cardiac Doppler Indications:    R55 Syncope  History:        Patient has prior history of Echocardiogram examinations, most                 recent 01/15/2016. Prior performed at [place]:    [place] Referring Phys: [phone] [place]  1. Left ventricular ejection fraction, by estimation, is 55 to 60%. The left ventricle has normal function. The left ventricle has no regional wall motion abnormalities. There is mild left ventricular hypertrophy of the basal-septal segment. Left ventricular diastolic parameters were normal.  2. Right ventricular systolic function is normal. The right ventricular size is normal.  3. The mitral valve is normal in structure. No evidence of mitral valve regurgitation. No  evidence of mitral stenosis.  4. The aortic valve is tricuspid. Aortic valve regurgitation is not visualized. No aortic stenosis is present.  5. The inferior vena cava is normal in size with greater  than 50% respiratory variability, suggesting right atrial pressure of 3 mmHg. FINDINGS  Left Ventricle: Left ventricular ejection fraction, by estimation, is 55 to 60%. The left ventricle has normal function. The left ventricle has no regional wall motion abnormalities. The left ventricular internal cavity size was normal in size. There is  mild left ventricular hypertrophy of the basal-septal segment. Left ventricular diastolic parameters were normal. Right Ventricle: The right ventricular size is normal.Right ventricular systolic function is normal. Left Atrium: Left atrial size was normal in size. Right Atrium: Right atrial size was normal in size. Pericardium: Trivial pericardial effusion is present. Mitral Valve: The mitral valve is normal in structure. No evidence of mitral valve regurgitation. No evidence of mitral valve stenosis. Tricuspid Valve: The tricuspid valve is normal in structure. Tricuspid valve regurgitation is trivial. No evidence of tricuspid stenosis. Aortic Valve: The aortic valve is tricuspid. Aortic valve regurgitation is not visualized. No aortic stenosis is present. Pulmonic Valve: The pulmonic valve was normal in structure. Pulmonic valve regurgitation is trivial. No evidence of pulmonic stenosis. Aorta: The aortic root is normal in size and structure. Venous: The inferior vena cava is normal in size with greater than 50% respiratory variability, suggesting right atrial pressure of 3 mmHg. IAS/Shunts: No atrial level shunt detected by color flow Doppler.  LEFT VENTRICLE PLAX 2D LVIDd:         4.00 cm  Diastology LVIDs:         2.60 cm  LV e' medial:    8.27 cm/s LV PW:         1.00 cm  LV E/e' medial:  6.0 LV IVS:        1.20 cm  LV e' lateral:   10.30 cm/s LVOT diam:     2.40 cm  LV E/e'  lateral: 4.8 LV SV:         95 LV SV Index:   49 LVOT Area:     4.52 cm  RIGHT VENTRICLE RV S prime:     11.40 cm/s TAPSE (M-mode): 2.4 cm LEFT ATRIUM             Index       RIGHT ATRIUM           Index LA diam:        3.30 cm 1.72 cm/m  RA Area:     18.20 cm LA Vol (A2C):   73.3 ml 38.20 ml/m RA Volume:   52.10 ml  27.15 ml/m LA Vol (A4C):   27.2 ml 14.17 ml/m LA Biplane Vol: 46.6 ml 24.28 ml/m  AORTIC VALVE LVOT Vmax:   92.00 cm/s LVOT Vmean:  62.400 cm/s LVOT VTI:    0.209 m  AORTA Ao Root diam: 3.30 cm MITRAL VALVE MV Area (PHT): 1.77 cm    SHUNTS MV Decel Time: 428 msec    Systemic VTI:  0.21 m MV E velocity: 49.30 cm/s  Systemic Diam: 2.40 cm MV A velocity: 73.70 cm/s MV E/A ratio:  0.67 Kirk Ruths MD Electronically signed by Kirk Ruths MD Signature Date/Time: 01/17/2021/10:18:45 AM    Final    DG Hip Unilat W or Wo Pelvis 2-3 Views Left  Result Date: 01/16/2021 CLINICAL DATA:  Recent fall with left hip pain, initial encounter EXAM: DG HIP (WITH OR WITHOUT PELVIS) 2-3V LEFT COMPARISON:  None. FINDINGS: Pelvic ring is intact. Mild degenerative changes of left hip joint are noted. No acute fracture or dislocation is seen. No soft tissue abnormality is noted. Degenerative  changes in the lower lumbar spine are noted. IMPRESSION: Degenerative change without acute abnormality. Electronically Signed   By: Inez Catalina M.D.   On: 01/16/2021 09:27   VAS [place] LOWER EXTREMITY VENOUS (DVT)  Result Date: 01/20/2021  Lower Venous DVT Study Patient Name:  JONAEL PARADISO [facility]  Date of Exam:   01/19/2021 Medical Rec #: [id number]        Accession #:    [id number] Date of Birth: [birth year]         Patient Gender: M Patient Age:   80Y Exam Location:  [facility] Procedure:      VAS [place] LOWER EXTREMITY VENOUS (DVT) Referring Phys: [id number] [employer] [place] --------------------------------------------------------------------------------  Other Indications: PE dx'd on 7/21 - patient started on Eliquis. Risk  Factors: Cancer Carcinoid tumor w/ mets. Comparison Study: No previous exams Performing Technologist: Jody Hill RVT, RDMS  Examination Guidelines: A complete evaluation includes B-mode imaging, spectral Doppler, color Doppler, and power Doppler as needed of all accessible portions of each vessel. Bilateral testing is considered an integral part of a complete examination. Limited examinations for reoccurring indications may be performed as noted. The reflux portion of the exam is performed with the patient in reverse Trendelenburg.  +---------+---------------+---------+-----------+----------+--------------+ RIGHT    CompressibilityPhasicitySpontaneityPropertiesThrombus Aging +---------+---------------+---------+-----------+----------+--------------+ CFV      Full           Yes      Yes                                 +---------+---------------+---------+-----------+----------+--------------+ SFJ      Full                                                        +---------+---------------+---------+-----------+----------+--------------+ FV Prox  Full           Yes      Yes                                 +---------+---------------+---------+-----------+----------+--------------+ FV Mid   Full           Yes      Yes                                 +---------+---------------+---------+-----------+----------+--------------+ FV DistalFull           Yes      Yes                                 +---------+---------------+---------+-----------+----------+--------------+ PFV      Full                                                        +---------+---------------+---------+-----------+----------+--------------+ POP      Full           Yes      Yes                                 +---------+---------------+---------+-----------+----------+--------------+  PTV      Full                                                         +---------+---------------+---------+-----------+----------+--------------+ PERO     Full                                                        +---------+---------------+---------+-----------+----------+--------------+   +---------+---------------+---------+-----------+----------+--------------+ LEFT     CompressibilityPhasicitySpontaneityPropertiesThrombus Aging +---------+---------------+---------+-----------+----------+--------------+ CFV      Full           Yes      Yes                                 +---------+---------------+---------+-----------+----------+--------------+ SFJ      Full                                                        +---------+---------------+---------+-----------+----------+--------------+ FV Prox  Full           Yes      Yes                                 +---------+---------------+---------+-----------+----------+--------------+ FV Mid   Full           Yes      Yes                                 +---------+---------------+---------+-----------+----------+--------------+ FV DistalFull           Yes      Yes                                 +---------+---------------+---------+-----------+----------+--------------+ PFV      Full                                                        +---------+---------------+---------+-----------+----------+--------------+ POP      Full           Yes      Yes                                 +---------+---------------+---------+-----------+----------+--------------+ PTV      Full                                                        +---------+---------------+---------+-----------+----------+--------------+  PERO     Full                                                        +---------+---------------+---------+-----------+----------+--------------+     Summary: BILATERAL: - No evidence of deep vein thrombosis seen in the lower extremities, bilaterally. - No evidence of  superficial venous thrombosis in the lower extremities, bilaterally. - RIGHT: - No cystic structure found in the popliteal fossa.  LEFT: - A cystic structure is found in the popliteal fossa.  *See table(s) above for measurements and observations. Electronically signed by Ruta Hinds MD on 01/20/2021 at 4:36:40 PM.    Final     Subjective: Seen and examined at bedside and was no longer feeling dizzy despite his blood pressure dropping somewhat.  No nausea or vomiting.  Denies any lightheadedness or dizziness and wanting to go home.  PT OT recommending home health and he is stable to be discharged at this time and he felt well prior to discharge.  Discharge Exam: Vitals:   01/22/21 0824 01/22/21 1233  BP:  (!) 186/78  Pulse:  (!) 42  Resp:  18  Temp:  98.3 F (36.8 C)  SpO2: 98% 91%   Vitals:   01/22/21 0445 01/22/21 0449 01/22/21 0824 01/22/21 1233  BP: (!) 160/79   (!) 186/78  Pulse: (!) 45   (!) 42  Resp: 20   18  Temp: 98.3 F (36.8 C)   98.3 F (36.8 C)  TempSrc: Oral   Oral  SpO2: 96%  98% 91%  Weight:  71.1 kg    Height:       General: Pt is alert, awake, not in acute distress Cardiovascular: RRR, S1/S2 +, no rubs, no gallops Respiratory: Diminished bilaterally, no wheezing, no rhonchi Abdominal: Soft, NT, ND, bowel sounds + Extremities: no edema, no cyanosis; wearing TED hose  The results of significant diagnostics from this hospitalization (including imaging, microbiology, ancillary and laboratory) are listed below for reference.    Microbiology: Recent Results (from the past 240 hour(s))  Resp Panel by RT-PCR (Flu A&B, Covid) Nasopharyngeal Swab     Status: None   Collection Time: 01/16/21  1:39 PM   Specimen: Nasopharyngeal Swab; Nasopharyngeal(NP) swabs in vial transport medium  Result Value Ref Range Status   SARS Coronavirus 2 by RT PCR NEGATIVE NEGATIVE Final    Comment: (NOTE) SARS-CoV-2 target nucleic acids are NOT DETECTED.  The SARS-CoV-2 RNA is  generally detectable in upper respiratory specimens during the acute phase of infection. The lowest concentration of SARS-CoV-2 viral copies this assay can detect is 138 copies/mL. A negative result does not preclude SARS-Cov-2 infection and should not be used as the sole basis for treatment or other patient management decisions. A negative result may occur with  improper specimen collection/handling, submission of specimen other than nasopharyngeal swab, presence of viral mutation(s) within the areas targeted by this assay, and inadequate number of viral copies(<138 copies/mL). A negative result must be combined with clinical observations, patient history, and epidemiological information. The expected result is Negative.  Fact Sheet for Patients:  EntrepreneurPulse.com.au  Fact Sheet for Healthcare Providers:  IncredibleEmployment.be  This test is no t yet approved or cleared by the [place] FDA and  has been authorized for detection and/or diagnosis of SARS-CoV-2 by FDA under an Emergency  Use Authorization (EUA). This EUA will remain  in effect (meaning this test can be used) for the duration of the COVID-19 declaration under Section 564(b)(1) of the Act, 21 U.S.C.section 360bbb-3(b)(1), unless the authorization is terminated  or revoked sooner.       Influenza A by PCR NEGATIVE NEGATIVE Final   Influenza B by PCR NEGATIVE NEGATIVE Final    Comment: (NOTE) The Xpert Xpress SARS-CoV-2/FLU/RSV plus assay is intended as an aid in the diagnosis of influenza from Nasopharyngeal swab specimens and should not be used as a sole basis for treatment. Nasal washings and aspirates are unacceptable for Xpert Xpress SARS-CoV-2/FLU/RSV testing.  Fact Sheet for Patients: EntrepreneurPulse.com.au  Fact Sheet for Healthcare Providers: IncredibleEmployment.be  This test is not yet approved or cleared by the [place] FDA and has been authorized for detection and/or diagnosis of SARS-CoV-2 by FDA under an Emergency Use Authorization (EUA). This EUA will remain in effect (meaning this test can be used) for the duration of the COVID-19 declaration under Section 564(b)(1) of the Act, 21 U.S.C. section 360bbb-3(b)(1), unless the authorization is terminated or revoked.  Performed at [facility], [place] [address], [place], [place] [address]     Labs: BNP (last 3 results) No results for input(s): BNP in the last 8760 hours. Basic Metabolic Panel: Recent Labs  Lab 01/16/21 0959 01/17/21 0336 01/18/21 1616 01/19/21 0500 01/22/21 0922  NA 143 142 145 141 139  K 3.0* 3.5 3.1* 3.5 3.7  CL 105 107 115* 109 107  CO2 27 29 25 28 25   GLUCOSE 148* 125* 134* 127* 116*  BUN 31* 26* 25* 25* 26*  CREATININE 1.56* 1.29* 1.22 1.17 1.29*  CALCIUM 9.4 9.1 7.5* 8.6* 8.6*  MG 2.2  --   --  1.7 1.5*  PHOS 2.8  --   --   --  2.6   Liver Function Tests: Recent Labs  Lab 01/16/21 0959 01/17/21 0336 01/22/21 0922  AST 19 23 31   ALT 18 18 28   ALKPHOS 44 45 40  BILITOT 1.1 1.8* 1.0  PROT 6.5 6.5 6.0*  ALBUMIN 3.7 3.7 3.1*   No results for input(s): LIPASE, AMYLASE in the last 168 hours. No results for input(s): AMMONIA in the last 168 hours. CBC: Recent Labs  Lab 01/16/21 0959 01/17/21 0336 01/18/21 1616 01/19/21 0500 01/22/21 0922  WBC 13.7* 10.2 9.4 8.2 8.6  NEUTROABS 11.5*  --  7.1  --  5.3  HGB 13.8 12.7* 11.6* 12.0* 12.7*  HCT 40.2 35.8* 32.6* 33.7* 35.7*  MCV 91.6 90.2 90.1 89.9 88.8  PLT 142* 133* 127* 130* 138*   Cardiac Enzymes: No results for input(s): CKTOTAL, CKMB, CKMBINDEX, TROPONINI in the last 168 hours. BNP: Invalid input(s): POCBNP CBG: Recent Labs  Lab 01/18/21 1602 01/19/21 0459 01/20/21 0809 01/21/21 0730 01/22/21 0446  GLUCAP 157* 120* 110* 129* 133*   D-Dimer No results for input(s): DDIMER in the last 72 hours. Hgb A1c No results for  input(s): HGBA1C in the last 72 hours. Lipid Profile No results for input(s): CHOL, HDL, LDLCALC, TRIG, CHOLHDL, LDLDIRECT in the last 72 hours. Thyroid function studies No results for input(s): TSH, T4TOTAL, T3FREE, THYROIDAB in the last 72 hours.  Invalid input(s): FREET3 Anemia work up No results for input(s): VITAMINB12, FOLATE, FERRITIN, TIBC, IRON, RETICCTPCT in the last 72 hours. Urinalysis    Component Value Date/Time   COLORURINE YELLOW 01/16/2021 1330   APPEARANCEUR CLEAR 01/16/2021 1330   LABSPEC 1.024 01/16/2021 1330  PHURINE 5.0 01/16/2021 1330   GLUCOSEU NEGATIVE 01/16/2021 1330   HGBUR NEGATIVE 01/16/2021 1330   BILIRUBINUR NEGATIVE 01/16/2021 1330   KETONESUR NEGATIVE 01/16/2021 1330   PROTEINUR NEGATIVE 01/16/2021 1330   UROBILINOGEN 0.2 08/23/2012 2225   NITRITE NEGATIVE 01/16/2021 1330   LEUKOCYTESUR NEGATIVE 01/16/2021 1330   Sepsis Labs Invalid input(s): PROCALCITONIN,  WBC,  LACTICIDVEN Microbiology Recent Results (from the past 240 hour(s))  Resp Panel by RT-PCR (Flu A&B, Covid) Nasopharyngeal Swab     Status: None   Collection Time: 01/16/21  1:39 PM   Specimen: Nasopharyngeal Swab; Nasopharyngeal(NP) swabs in vial transport medium  Result Value Ref Range Status   SARS Coronavirus 2 by RT PCR NEGATIVE NEGATIVE Final    Comment: (NOTE) SARS-CoV-2 target nucleic acids are NOT DETECTED.  The SARS-CoV-2 RNA is generally detectable in upper respiratory specimens during the acute phase of infection. The lowest concentration of SARS-CoV-2 viral copies this assay can detect is 138 copies/mL. A negative result does not preclude SARS-Cov-2 infection and should not be used as the sole basis for treatment or other patient management decisions. A negative result may occur with  improper specimen collection/handling, submission of specimen other than nasopharyngeal swab, presence of viral mutation(s) within the areas targeted by this assay, and inadequate  number of viral copies(<138 copies/mL). A negative result must be combined with clinical observations, patient history, and epidemiological information. The expected result is Negative.  Fact Sheet for Patients:  EntrepreneurPulse.com.au  Fact Sheet for Healthcare Providers:  IncredibleEmployment.be  This test is no t yet approved or cleared by the [place] FDA and  has been authorized for detection and/or diagnosis of SARS-CoV-2 by FDA under an Emergency Use Authorization (EUA). This EUA will remain  in effect (meaning this test can be used) for the duration of the COVID-19 declaration under Section 564(b)(1) of the Act, 21 U.S.C.section 360bbb-3(b)(1), unless the authorization is terminated  or revoked sooner.       Influenza A by PCR NEGATIVE NEGATIVE Final   Influenza B by PCR NEGATIVE NEGATIVE Final    Comment: (NOTE) The Xpert Xpress SARS-CoV-2/FLU/RSV plus assay is intended as an aid in the diagnosis of influenza from Nasopharyngeal swab specimens and should not be used as a sole basis for treatment. Nasal washings and aspirates are unacceptable for Xpert Xpress SARS-CoV-2/FLU/RSV testing.  Fact Sheet for Patients: EntrepreneurPulse.com.au  Fact Sheet for Healthcare Providers: IncredibleEmployment.be  This test is not yet approved or cleared by the [place] FDA and has been authorized for detection and/or diagnosis of SARS-CoV-2 by FDA under an Emergency Use Authorization (EUA). This EUA will remain in effect (meaning this test can be used) for the duration of the COVID-19 declaration under Section 564(b)(1) of the Act, 21 U.S.C. section 360bbb-3(b)(1), unless the authorization is terminated or revoked.  Performed at [facility], [place] [address] [place], [place] [address]    Time coordinating discharge: 35 minutes  SIGNED:  Kerney Elbe, DO [facility] 01/22/2021, 9:49 PM Pager is on [place]  If 7PM-7AM, please contact night-coverage [email or web address]

## 2021-01-22 NOTE — Progress Notes (Signed)
OT Cancellation Note  Patient Details Name: Alan [name] MRN: [id number] DOB: [birth year]   Cancelled Treatment:    Reason Eval/Treat Not Completed: OT screened, no needs identified, will sign off. Patient evaluated Monday. Received new order. No significant changes since evaluation. Refer to 7/22 evlaluation for recommendations.  [name] [name] 01/22/2021, 2:30 PM

## 2021-01-22 NOTE — Plan of Care (Signed)
  Problem: Clinical Measurements: Goal: Diagnostic test results will improve Outcome: Progressing   Problem: Nutrition: Goal: Adequate nutrition will be maintained Outcome: Progressing   Problem: Coping: Goal: Level of anxiety will decrease Outcome: Progressing   

## 2021-01-22 NOTE — Progress Notes (Addendum)
Progress Note  Patient Name: Nivek Powley Tompkins Date of Encounter: 01/22/2021  [facility] Cardiologist: Fransico Him, MD   Subjective   Denies any CP or SOB.   Inpatient Medications    Scheduled Meds:  (feeding supplement) PROSource Plus  30 mL Oral TID BM   midodrine  5 mg Oral TID WC   mometasone-formoterol  2 puff Inhalation BID   multivitamin with minerals  1 tablet Oral Daily   sodium chloride flush  3 mL Intravenous Q12H   Vitamin D (Ergocalciferol)  50,000 Units Oral Q7 days   Continuous Infusions:  sodium chloride 50 mL/hr at 01/21/21 0036   PRN Meds: acetaminophen **OR** acetaminophen, albuterol, hydrOXYzine, loperamide, ondansetron **OR** ondansetron (ZOFRAN) IV   Vital Signs    Vitals:   01/21/21 2026 01/22/21 0445 01/22/21 0449 01/22/21 0824  BP: 136/79 (!) 160/79    Pulse: (!) 54 (!) 45    Resp: 20 20    Temp: 98.8 F (37.1 C) 98.3 F (36.8 C)    TempSrc:  Oral    SpO2: 95% 96%  98%  Weight:   71.1 kg   Height:        Intake/Output Summary (Last 24 hours) at 01/22/2021 1021 Last data filed at 01/22/2021 1572 Gross per 24 hour  Intake 1790.79 ml  Output 1125 ml  Net 665.79 ml   Last 3 Weights 01/22/2021 01/21/2021 01/20/2021  Weight (lbs) 156 lb 12 oz 150 lb 2.1 oz 150 lb 9.2 oz  Weight (kg) 71.1 kg 68.1 kg 68.3 kg      Telemetry    Sinus bradycardia with HR 40s - Personally Reviewed  ECG    Sinus bradycardia, no significant ST-T wave changes - Personally Reviewed  Physical Exam   GEN: No acute distress.   Neck: No JVD Cardiac: RRR, no murmurs, rubs, or gallops.  Respiratory: Clear to auscultation bilaterally. GI: Soft, nontender, non-distended  MS: No edema; No deformity. Neuro:  Nonfocal  Psych: Normal affect   Labs    High Sensitivity Troponin:   Recent Labs  Lab 01/18/21 1616 01/18/21 1746  TROPONINIHS 8 7      Chemistry Recent Labs  Lab 01/16/21 0959 01/17/21 0336 01/18/21 1616 01/19/21 0500 01/22/21 0922  NA  143 142 145 141 139  K 3.0* 3.5 3.1* 3.5 3.7  CL 105 107 115* 109 107  CO2 27 29 25 28 25   GLUCOSE 148* 125* 134* 127* 116*  BUN 31* 26* 25* 25* 26*  CREATININE 1.56* 1.29* 1.22 1.17 1.29*  CALCIUM 9.4 9.1 7.5* 8.6* 8.6*  PROT 6.5 6.5  --   --  6.0*  ALBUMIN 3.7 3.7  --   --  3.1*  AST 19 23  --   --  31  ALT 18 18  --   --  28  ALKPHOS 44 45  --   --  40  BILITOT 1.1 1.8*  --   --  1.0  GFRNONAA 45* 56* >60 >60 56*  ANIONGAP 11 6 5  4* 7     Hematology Recent Labs  Lab 01/18/21 1616 01/19/21 0500 01/22/21 0922  WBC 9.4 8.2 8.6  RBC 3.62* 3.75* 4.02*  HGB 11.6* 12.0* 12.7*  HCT 32.6* 33.7* 35.7*  MCV 90.1 89.9 88.8  MCH 32.0 32.0 31.6  MCHC 35.6 35.6 35.6  RDW 13.2 13.1 12.6  PLT 127* 130* 138*    BNPNo results for input(s): BNP, PROBNP in the last 168 hours.   DDimer  Recent Labs  Lab 01/16/21 0959  DDIMER 12.54*     Radiology    No results found.  Cardiac Studies   Echo 01/17/2021  1. Left ventricular ejection fraction, by estimation, is 55 to 60%. The  left ventricle has normal function. The left ventricle has no regional  wall motion abnormalities. There is mild left ventricular hypertrophy of  the basal-septal segment. Left  ventricular diastolic parameters were normal.   2. Right ventricular systolic function is normal. The right ventricular  size is normal.   3. The mitral valve is normal in structure. No evidence of mitral valve  regurgitation. No evidence of mitral stenosis.   4. The aortic valve is tricuspid. Aortic valve regurgitation is not  visualized. No aortic stenosis is present.   5. The inferior vena cava is normal in size with greater than 50%  respiratory variability, suggesting right atrial pressure of 3 mmHg.   Patient Profile     80 y.o. 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  -Echo showed EF 55-60% with basal-septal hypertrophy, no significant valve issue - compression stocking and abdominal binder - tamsulosin and flomax discontinued   - orthostatic vital lying 161/84 HR 47, sitting 147/83 HR 53, standing 115/70 HR 60, standing 3 min 118/68 HR 73  - continue on the current therapy. MD to decide if still need to add florinef at this point. Likely can be discharged today from cardiac perspective. Will arrange follow up.   Anemia: stable  Sinus bradycardia  - according to the previous note, this was felt to be his baseline and not felt to be contributing to symptoms. HR persistently in the 40s.   Pulmonary embolus: small dized PE. not on AC due to higher risk vs benefit. 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 Agree with above.  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  This is on midodrine 5 mg 3 times daily with meals.  Lets continue with this as well as his conservative management strategies compression hose etc.  I am willing to tolerate some hypertension because of his significant hypotension during standing.  He did not feel dizziness when standing this morning.  Bradycardia - I do not believe this is playing a role in his symptoms.  I am okay with discharge.  We will set up follow-up.  Candee Furbish, MD

## 2021-01-23 ENCOUNTER — Telehealth: Payer: Self-pay | Admitting: Cardiology

## 2021-01-23 NOTE — Telephone Encounter (Signed)
Pt c/o BP issue: STAT if pt c/o blurred vision, one-sided weakness or slurred speech  1. What are your last 5 BP readings?  before medication 83/48 After medication 118/54  2. Are you having any other symptoms (ex. Dizziness, headache, blurred vision, passed out)? Feeling very weak  3. What is your BP issue? Low when he stands the bp drops.   Patient's wife is wondering if the medication need to be up to 3 times a day instead of two because when he was in the hospital it was 3.

## 2021-01-23 NOTE — Telephone Encounter (Signed)
Spoke with pt's wife with verbal permission from pt Pt was given written script for Midodrine for bid and discharge summary states tid Wife to call pharmacy and get corrected Pt has appt at [place] location and needs to be seen sooner Per wife [place] location is not convenient [facility] is closer Will forward to [facility] to change appt ./cy

## 2021-01-27 NOTE — Telephone Encounter (Signed)
Spoke with pt's wife and pharmacy did give enough Midodrine for 3 times a day n ot sure why label said twice daily and also are aware the reason for appt at [place] location and verbalizes understanding Will continue to monitor and call if needed ./cy

## 2021-02-17 NOTE — Progress Notes (Signed)
Cardiology Office Note   Date:  02/20/2021   ID:  Alan [name], DOB [birth year], MRN [id number]  PCP:  [name], MD  Cardiologist:  Fransico Him MD   Chief Complaint  Patient presents with   Near Syncope      History of Present Illness: Alan [name] is a 80 y.o. male who is seen for post hospital follow up for Dr Marlou Porch He has a history of CKD, malignant carcinoid of the intestine,  and HTN. He was admitted in July with presyncope. He was orthostatic and started on midodrine. He had a small subsegmental PE. Initially anticoagulated but developed significant mucosal bleeding and anemia so anticoagulation was stopped. Was bradycardic but this was at baseline and felt not to contribute to his symptoms. Echo was OK.   Since DC he has no further syncope or near syncope. No dizziness. Concerned about his slow HR. Not wearing compression hose. Sleeps in a recliner. Notes with his cancer he has lost 100 lbs this year. Is getting chemo injection once a month.    Past Medical History:  Diagnosis Date   Asthma    Carcinoid tumor, small intestine, malignant (HCC)    Chronic diarrhea    CKD (chronic kidney disease) stage 3, GFR 30-59 ml/min (HCC)    HTN (hypertension)    Short gut syndrome     Past Surgical History:  Procedure Laterality Date   COLON SURGERY       Current Outpatient Medications  Medication Sig Dispense Refill   aspirin 81 MG EC tablet Take 81 mg by mouth daily. Swallow whole.     chlorpheniramine-HYDROcodone (TUSSIONEX PENNKINETIC ER) 10-8 MG/5ML SUER Take 5 mLs by mouth every 12 (twelve) hours as needed for cough. 115 mL 0   LANREOTIDE ACETATE [place] Inject into the skin.     midodrine (PROAMATINE) 5 MG tablet Take 1 tablet (5 mg total) by mouth 3 (three) times daily with meals. 90 tablet 0   mirtazapine (REMERON) 7.5 MG tablet Take by mouth.     Multiple Vitamin (MULTIVITAMIN WITH MINERALS) TABS Take 1 tablet by mouth daily.     Multiple  Vitamins-Minerals (PRESERVISION AREDS 2 PO) Take 1 capsule by mouth daily.     Nutritional Supplements (,FEEDING SUPPLEMENT, PROSOURCE PLUS) liquid Take 30 mLs by mouth 3 (three) times daily between meals. 887 mL 0   SYMBICORT 160-4.5 MCG/ACT inhaler Inhale 2 puffs into the lungs at bedtime.     Vitamin D, Ergocalciferol, (DRISDOL) 1.25 MG (50000 UNIT) CAPS capsule Take 50,000 Units by mouth every 7 (seven) days.     No current facility-administered medications for this visit.    Allergies:   Penicillins and Lisinopril    Social History:  The patient  reports that he quit smoking about a year ago. His smoking use included cigarettes. He started smoking about 62 years ago. He has a 18.30 pack-year smoking history. He has never used smokeless tobacco. He reports that he does not currently use alcohol. He reports that he does not use drugs.   Family History:  The patient's family history includes Alzheimer's disease in his mother.    ROS:  Please see the history of present illness.   Otherwise, review of systems are positive for none.   All other systems are reviewed and negative.    PHYSICAL EXAM: VS:  BP 118/80   Pulse 61   Resp 20   Ht 6\' 1"  (1.854 m)   Wt 147 lb  9.6 oz (67 kg)   SpO2 96%   BMI 19.47 kg/m  , BMI Body mass index is 19.47 kg/m. GEN: Well nourished, thin in no acute distress HEENT: normal Neck: no JVD, carotid bruits, or masses Cardiac: RRR; no murmurs, rubs, or gallops,no edema  Respiratory:  clear to auscultation bilaterally, normal work of breathing GI: soft, nontender, nondistended, + BS MS: no deformity or atrophy Skin: warm and dry, no rash Neuro:  Strength and sensation are intact Psych: euthymic mood, full affect   EKG:  EKG is not ordered today. The ekg ordered today demonstrates N/A   Recent Labs: 01/22/2021: ALT 28; BUN 26; Creatinine, Ser 1.29; Magnesium 1.5; Potassium 3.7; Sodium 139 02/19/2021: Hemoglobin 12.3; Platelets 179.0   Dated 01/27/21:  creatinine 1.53. Hgb 12.7. otherwise CMET and CBC normal.  No results found for: CHOL, TRIG, HDL, CHOLHDL, VLDL, LDLCALC, LDLDIRECT    Wt Readings from Last 3 Encounters:  02/20/21 147 lb 9.6 oz (67 kg)  02/19/21 145 lb 9.6 oz (66 kg)  01/22/21 156 lb 12 oz (71.1 kg)      Other studies Reviewed: Additional studies/ records that were reviewed today include:   Echo 01/17/2021  1. Left ventricular ejection fraction, by estimation, is 55 to 60%. The  left ventricle has normal function. The left ventricle has no regional  wall motion abnormalities. There is mild left ventricular hypertrophy of  the basal-septal segment. Left  ventricular diastolic parameters were normal.   2. Right ventricular systolic function is normal. The right ventricular  size is normal.   3. The mitral valve is normal in structure. No evidence of mitral valve  regurgitation. No evidence of mitral stenosis.   4. The aortic valve is tricuspid. Aortic valve regurgitation is not  visualized. No aortic stenosis is present.   5. The inferior vena cava is normal in size with greater than 50%  respiratory variability, suggesting right atrial pressure of 3 mmHg.    ASSESSMENT AND PLAN:  1.  Orthostatic hypotension / syncope - no recurrence on midodrine. BP normal today. - encourage good hydration and liberalization of sodium intake - compression hose - do not sleep lying flat - uses a recliner - Echo OK - follow up with Dr Marlou Porch    Anemia: stable   Sinus bradycardia             - according to the previous note, this was felt to be his baseline and not felt to be contributing to symptoms. Will arrange for 3 week event monitor    Pulmonary embolus: small dized PE. not on AC due to higher risk vs benefit. 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  The patient does not have concerns regarding  medicines.  The following changes have been made:  no change  Labs/ tests ordered today include: 3 week event monitor    Disposition:   FU with Dr Marlou Porch  in 6 weeks  Signed, Jamayia Croker [place], MD  02/20/2021 2:11 PM    [place] [address], [place], [place], [address] Phone [phone], Fax [phone]

## 2021-02-19 ENCOUNTER — Ambulatory Visit (INDEPENDENT_AMBULATORY_CARE_PROVIDER_SITE_OTHER): Payer: Medicare Other

## 2021-02-19 ENCOUNTER — Encounter: Payer: Self-pay | Admitting: Internal Medicine

## 2021-02-19 ENCOUNTER — Other Ambulatory Visit: Payer: Self-pay

## 2021-02-19 ENCOUNTER — Ambulatory Visit (INDEPENDENT_AMBULATORY_CARE_PROVIDER_SITE_OTHER): Payer: Medicare Other | Admitting: Internal Medicine

## 2021-02-19 VITALS — BP 122/78 | HR 60 | Temp 98.8°F | Resp 20 | Ht 73.0 in | Wt 145.6 lb

## 2021-02-19 DIAGNOSIS — R634 Abnormal weight loss: Secondary | ICD-10-CM | POA: Insufficient documentation

## 2021-02-19 DIAGNOSIS — U071 COVID-19: Secondary | ICD-10-CM | POA: Diagnosis not present

## 2021-02-19 DIAGNOSIS — D500 Iron deficiency anemia secondary to blood loss (chronic): Secondary | ICD-10-CM

## 2021-02-19 DIAGNOSIS — D539 Nutritional anemia, unspecified: Secondary | ICD-10-CM | POA: Insufficient documentation

## 2021-02-19 DIAGNOSIS — R058 Other specified cough: Secondary | ICD-10-CM | POA: Insufficient documentation

## 2021-02-19 DIAGNOSIS — I951 Orthostatic hypotension: Secondary | ICD-10-CM

## 2021-02-19 DIAGNOSIS — J208 Acute bronchitis due to other specified organisms: Secondary | ICD-10-CM

## 2021-02-19 MED ORDER — MIDODRINE HCL 5 MG PO TABS: 5.0000 mg | ORAL_TABLET | Freq: Three times a day (TID) | ORAL | 0 refills | Status: DC

## 2021-02-19 NOTE — Progress Notes (Signed)
Subjective:  Patient ID: Alan [name], male    DOB: 1940-11-27  Age: 80 y.o. MRN: [id number]  CC: Anemia  This visit occurred during the SARS-CoV-2 public health emergency.  Safety protocols were in place, including screening questions prior to the visit, additional usage of staff PPE, and extensive cleaning of exam room while observing appropriate contact time as indicated for disinfecting solutions.    HPI Alan [name] presents for f/up and to establish-  He complains of a 2-week history of nonproductive cough.  He is being treated for orthostatic hypotension and carcinoid.  He recently had a pulmonary embolus.  He complains of weight gain, chronic shortness of breath, and chronic diarrhea.  History Alan [name] has a past medical history of Asthma, Carcinoid tumor, small intestine, malignant ([place]), Chronic diarrhea, CKD (chronic kidney disease) stage 3, GFR 30-59 ml/min ([place]), HTN (hypertension), and Short gut syndrome.   He has a past surgical history that includes Colon surgery.   His family history includes Alzheimer's disease in his mother.He reports that he quit smoking about a year ago. His smoking use included cigarettes. He started smoking about 62 years ago. He has a 18.30 pack-year smoking history. He has never used smokeless tobacco. He reports that he does not currently use alcohol. He reports that he does not use drugs.  Outpatient Medications Prior to Visit  Medication Sig Dispense Refill   aspirin 81 MG EC tablet Take 81 mg by mouth daily. Swallow whole.     LANREOTIDE ACETATE [place] Inject into the skin.     mirtazapine (REMERON) 7.5 MG tablet Take by mouth.     Multiple Vitamin (MULTIVITAMIN WITH MINERALS) TABS Take 1 tablet by mouth daily.     Multiple Vitamins-Minerals (PRESERVISION AREDS 2 PO) Take 1 capsule by mouth daily.     Nutritional Supplements (,FEEDING SUPPLEMENT, PROSOURCE PLUS) liquid Take 30 mLs by mouth 3 (three) times daily between meals. 887 mL 0    SYMBICORT 160-4.5 MCG/ACT inhaler Inhale 2 puffs into the lungs at bedtime.     albuterol (VENTOLIN HFA) 108 (90 Base) MCG/ACT inhaler Inhale 2 puffs into the lungs every 4 (four) hours as needed for shortness of breath. (Patient not taking: Reported on 02/20/2021) 18 g 0   midodrine (PROAMATINE) 5 MG tablet Take 1 tablet (5 mg total) by mouth 3 (three) times daily with meals. 90 tablet 0   Vitamin D, Ergocalciferol, (DRISDOL) 1.25 MG (50000 UNIT) CAPS capsule Take 50,000 Units by mouth every 7 (seven) days.     acetaminophen (TYLENOL) 325 MG tablet Take 2 tablets (650 mg total) by mouth every 6 (six) hours as needed for mild pain (or Fever >/= 101). 20 tablet 0   hydrOXYzine (ATARAX/VISTARIL) 10 MG tablet Take 1 tablet (10 mg total) by mouth 3 (three) times daily as needed for anxiety. 30 tablet 0   ondansetron (ZOFRAN) 4 MG tablet Take 1 tablet (4 mg total) by mouth every 6 (six) hours as needed for nausea. 20 tablet 0   No facility-administered medications prior to visit.    ROS Review of Systems  Constitutional:  Positive for fatigue and unexpected weight change. Negative for chills, diaphoresis and fever.  HENT: Negative.    Eyes: Negative.   Respiratory:  Positive for cough and shortness of breath. Negative for choking, chest tightness and wheezing.   Cardiovascular:  Negative for chest pain, palpitations and leg swelling.  Gastrointestinal:  Positive for diarrhea. Negative for abdominal pain, blood in stool, nausea and  vomiting.  Endocrine: Negative.   Genitourinary: Negative.  Negative for difficulty urinating.  Musculoskeletal: Negative.   Skin: Negative.  Negative for color change and rash.  Neurological:  Positive for dizziness and light-headedness. Negative for weakness.  Hematological:  Negative for adenopathy. Does not bruise/bleed easily.  Psychiatric/Behavioral: Negative.     Objective:  BP 122/78 (BP Location: Right Arm, Patient Position: Sitting, Cuff Size: Large)    Pulse 60   Temp 98.8 F (37.1 C) (Oral)   Resp 20   Ht 6\' 1"  (1.854 m)   Wt 145 lb 9.6 oz (66 kg)   SpO2 96%   BMI 19.21 kg/m   Physical Exam Constitutional:      Appearance: He is underweight. He is ill-appearing.  HENT:     Mouth/Throat:     Mouth: Mucous membranes are moist.  Eyes:     Conjunctiva/sclera: Conjunctivae normal.  Cardiovascular:     Rate and Rhythm: Normal rate and regular rhythm.     Heart sounds: No murmur heard. Pulmonary:     Effort: Pulmonary effort is normal.     Breath sounds: Examination of the right-lower field reveals rhonchi. Examination of the left-lower field reveals rhonchi. Rhonchi present. No wheezing or rales.  Abdominal:     General: Abdomen is flat.     Palpations: There is no mass.     Tenderness: There is no abdominal tenderness. There is no guarding.  Musculoskeletal:        General: Normal range of motion.     Cervical back: Neck supple.     Right lower leg: No edema.     Left lower leg: No edema.  Skin:    General: Skin is warm and dry.  Neurological:     General: No focal deficit present.     Mental Status: He is alert.  Psychiatric:        Mood and Affect: Mood normal.        Behavior: Behavior normal.    Lab Results  Component Value Date   WBC 7.2 02/19/2021   HGB 12.3 (L) 02/19/2021   HCT 36.4 (L) 02/19/2021   PLT 179.0 02/19/2021   GLUCOSE 116 (H) 01/22/2021   ALT 28 01/22/2021   AST 31 01/22/2021   NA 139 01/22/2021   K 3.7 01/22/2021   CL 107 01/22/2021   CREATININE 1.29 (H) 01/22/2021   BUN 26 (H) 01/22/2021   CO2 25 01/22/2021   INR 1.0 01/16/2021   HGBA1C 5.1 01/16/2021    DG Chest 2 View  Result Date: 02/19/2021 CLINICAL DATA:  Productive cough. EXAM: CHEST - 2 VIEW COMPARISON:  01/16/2021 FINDINGS: The entire right costophrenic angle has not been included on the study. Within this limitation, there is no evidence for an pulmonary edema or pleural effusion. No focal airspace consolidation. Lungs appear  hyperexpanded. The cardiopericardial silhouette is within normal limits for size. The visualized bony structures of the thorax show no acute abnormality. IMPRESSION: Hyperexpansion without acute cardiopulmonary findings. Electronically Signed   By: Misty Stanley M.D.   On: 02/19/2021 16:58     Assessment & Plan:   Dyshon was seen today for anemia.  Diagnoses and all orders for this visit:  Deficiency anemia- I will screen for vitamin deficiencies. -     CBC with Differential/Platelet; Future -     Vitamin B12; Future -     IBC + Ferritin; Future -     Folate; Future -     Vitamin B1;  Future -     Vitamin B1 -     Folate -     IBC + Ferritin -     Vitamin B12 -     CBC with Differential/Platelet  Weight loss, abnormal- Will check labs to screen for secondary causes. -     Thyroid Panel With TSH; Future -     DG Chest 2 View; Future -     Thyroid Panel With TSH  Productive cough- His chest x-ray is negative for mass or infiltrate.  Will treat symptomatically for COVID-19 infection. -     CBC with Differential/Platelet; Future -     DG Chest 2 View; Future -     SARS-COV-2 IgG; Future -     SARS-COV-2 IgG -     CBC with Differential/Platelet  Orthostatic hypotension -     Discontinue: midodrine (PROAMATINE) 5 MG tablet; Take 1 tablet (5 mg total) by mouth 3 (three) times daily with meals.  Acute bronchitis due to 2019 novel coronavirus -     Discontinue: chlorpheniramine-HYDROcodone (TUSSIONEX PENNKINETIC ER) 10-8 MG/5ML SUER; Take 5 mLs by mouth every 12 (twelve) hours as needed for cough. -     chlorpheniramine-HYDROcodone (TUSSIONEX PENNKINETIC ER) 10-8 MG/5ML SUER; Take 5 mLs by mouth every 12 (twelve) hours as needed for cough.  Iron deficiency anemia due to chronic blood loss- I recommended that he receive iron infusions. -     Ambulatory referral to Hematology / Oncology  I have discontinued Josie Saunders. Beyl's acetaminophen, hydrOXYzine, and ondansetron. I am also having  him maintain his multivitamin with minerals, Vitamin D (Ergocalciferol), Multiple Vitamins-Minerals (PRESERVISION AREDS 2 PO), Symbicort, (feeding supplement) PROSource Plus, mirtazapine, aspirin, LANREOTIDE ACETATE [place], and chlorpheniramine-HYDROcodone.  Meds ordered this encounter  Medications   DISCONTD: midodrine (PROAMATINE) 5 MG tablet    Sig: Take 1 tablet (5 mg total) by mouth 3 (three) times daily with meals.    Dispense:  270 tablet    Refill:  0   DISCONTD: chlorpheniramine-HYDROcodone (TUSSIONEX PENNKINETIC ER) 10-8 MG/5ML SUER    Sig: Take 5 mLs by mouth every 12 (twelve) hours as needed for cough.    Dispense:  115 mL    Refill:  0   chlorpheniramine-HYDROcodone (TUSSIONEX PENNKINETIC ER) 10-8 MG/5ML SUER    Sig: Take 5 mLs by mouth every 12 (twelve) hours as needed for cough.    Dispense:  115 mL    Refill:  0      Follow-up: Return in about 6 weeks (around 04/02/2021).  Scarlette Calico, MD

## 2021-02-19 NOTE — Patient Instructions (Signed)

## 2021-02-20 ENCOUNTER — Ambulatory Visit (INDEPENDENT_AMBULATORY_CARE_PROVIDER_SITE_OTHER): Payer: Medicare Other | Admitting: Cardiology

## 2021-02-20 ENCOUNTER — Encounter: Payer: Self-pay | Admitting: Cardiology

## 2021-02-20 ENCOUNTER — Other Ambulatory Visit: Payer: Self-pay

## 2021-02-20 VITALS — BP 118/80 | HR 61 | Resp 20 | Ht 73.0 in | Wt 147.6 lb

## 2021-02-20 DIAGNOSIS — R001 Bradycardia, unspecified: Secondary | ICD-10-CM | POA: Diagnosis not present

## 2021-02-20 DIAGNOSIS — J208 Acute bronchitis due to other specified organisms: Secondary | ICD-10-CM | POA: Insufficient documentation

## 2021-02-20 DIAGNOSIS — U071 COVID-19: Secondary | ICD-10-CM | POA: Insufficient documentation

## 2021-02-20 DIAGNOSIS — I951 Orthostatic hypotension: Secondary | ICD-10-CM

## 2021-02-20 DIAGNOSIS — D500 Iron deficiency anemia secondary to blood loss (chronic): Secondary | ICD-10-CM | POA: Insufficient documentation

## 2021-02-20 LAB — IBC + FERRITIN
Ferritin: 163.6 ng/mL (ref 22.0–322.0)
Iron: 21 ug/dL — ABNORMAL LOW (ref 42–165)
Saturation Ratios: 9.4 % — ABNORMAL LOW (ref 20.0–50.0)
TIBC: 224 ug/dL — ABNORMAL LOW (ref 250.0–450.0)
Transferrin: 160 mg/dL — ABNORMAL LOW (ref 212.0–360.0)

## 2021-02-20 LAB — CBC WITH DIFFERENTIAL/PLATELET
Basophils Absolute: 0.1 10*3/uL (ref 0.0–0.1)
Basophils Relative: 0.9 % (ref 0.0–3.0)
Eosinophils Absolute: 0.1 10*3/uL (ref 0.0–0.7)
Eosinophils Relative: 1.5 % (ref 0.0–5.0)
HCT: 36.4 % — ABNORMAL LOW (ref 39.0–52.0)
Hemoglobin: 12.3 g/dL — ABNORMAL LOW (ref 13.0–17.0)
Lymphocytes Relative: 37.3 % (ref 12.0–46.0)
Lymphs Abs: 2.7 10*3/uL (ref 0.7–4.0)
MCHC: 33.7 g/dL (ref 30.0–36.0)
MCV: 92.6 fl (ref 78.0–100.0)
Monocytes Absolute: 0.8 10*3/uL (ref 0.1–1.0)
Monocytes Relative: 11 % (ref 3.0–12.0)
Neutro Abs: 3.5 10*3/uL (ref 1.4–7.7)
Neutrophils Relative %: 49.3 % (ref 43.0–77.0)
Platelets: 179 10*3/uL (ref 150.0–400.0)
RBC: 3.93 Mil/uL — ABNORMAL LOW (ref 4.22–5.81)
RDW: 13.4 % (ref 11.5–15.5)
WBC: 7.2 10*3/uL (ref 4.0–10.5)

## 2021-02-20 LAB — VITAMIN B12: Vitamin B-12: 514 pg/mL (ref 211–911)

## 2021-02-20 LAB — SARS-COV-2 IGG: SARS-COV-2 IgG: 6.46

## 2021-02-20 LAB — FOLATE: Folate: >24.4 ng/mL (ref >5.9)

## 2021-02-20 MED ORDER — MIDODRINE HCL 5 MG PO TABS: 5.0000 mg | ORAL_TABLET | Freq: Three times a day (TID) | ORAL | 11 refills | Status: DC

## 2021-02-20 MED ORDER — HYDROCOD POLST-CPM POLST ER 10-8 MG/5ML PO SUER: 5.0000 mL | Freq: Two times a day (BID) | ORAL | 0 refills | Status: DC | PRN

## 2021-02-20 NOTE — Patient Instructions (Addendum)
Stay well hydrated. Liberalize your sodium intake  Continue midodrine  We will have you wear a monitor  Follow up with Dr Marlou Porch after monitor

## 2021-02-20 NOTE — Telephone Encounter (Signed)
Patient states the pharmacy does not have medication sent from the patient visit on 8/24

## 2021-02-20 NOTE — Addendum Note (Signed)
Addended by: Kathyrn Lass on: 02/20/2021 02:23 PM   Modules accepted: Orders

## 2021-02-21 ENCOUNTER — Other Ambulatory Visit: Payer: Self-pay | Admitting: Internal Medicine

## 2021-02-21 MED ORDER — HYDROCOD POLST-CPM POLST ER 10-8 MG/5ML PO SUER: 5.0000 mL | Freq: Two times a day (BID) | ORAL | 0 refills | Status: DC | PRN

## 2021-02-24 ENCOUNTER — Telehealth: Payer: Self-pay | Admitting: Hematology

## 2021-02-24 LAB — VITAMIN B1: Vitamin B1 (Thiamine): 20 nmol/L (ref 8–30)

## 2021-02-24 LAB — THYROID PANEL WITH TSH
Free Thyroxine Index: 1.8 (ref 1.4–3.8)
T3 Uptake: 30 % (ref 22–35)
T4, Total: 6.1 ug/dL (ref 4.9–10.5)
TSH: 0.63 mIU/L (ref 0.40–4.50)

## 2021-02-24 NOTE — Telephone Encounter (Signed)
Scheduled appt per 8/25 referral. Pt's wife is aware of appt date and time.

## 2021-02-25 ENCOUNTER — Other Ambulatory Visit: Payer: Self-pay

## 2021-02-25 ENCOUNTER — Other Ambulatory Visit: Payer: Self-pay | Admitting: Internal Medicine

## 2021-02-25 ENCOUNTER — Inpatient Hospital Stay: Payer: Medicare Other | Attending: Hematology | Admitting: Hematology

## 2021-02-25 ENCOUNTER — Encounter: Payer: Self-pay | Admitting: Internal Medicine

## 2021-02-25 VITALS — BP 148/72 | HR 47 | Temp 98.0°F | Resp 17 | Wt 148.4 lb

## 2021-02-25 DIAGNOSIS — C7A019 Malignant carcinoid tumor of the small intestine, unspecified portion: Secondary | ICD-10-CM

## 2021-02-25 DIAGNOSIS — I129 Hypertensive chronic kidney disease with stage 1 through stage 4 chronic kidney disease, or unspecified chronic kidney disease: Secondary | ICD-10-CM | POA: Insufficient documentation

## 2021-02-25 DIAGNOSIS — J208 Acute bronchitis due to other specified organisms: Secondary | ICD-10-CM

## 2021-02-25 DIAGNOSIS — D500 Iron deficiency anemia secondary to blood loss (chronic): Secondary | ICD-10-CM

## 2021-02-25 DIAGNOSIS — C7B00 Secondary carcinoid tumors, unspecified site: Secondary | ICD-10-CM | POA: Insufficient documentation

## 2021-02-25 DIAGNOSIS — D509 Iron deficiency anemia, unspecified: Secondary | ICD-10-CM | POA: Diagnosis not present

## 2021-02-25 DIAGNOSIS — Z87891 Personal history of nicotine dependence: Secondary | ICD-10-CM | POA: Diagnosis not present

## 2021-02-25 DIAGNOSIS — C7A098 Malignant carcinoid tumors of other sites: Secondary | ICD-10-CM | POA: Diagnosis not present

## 2021-02-25 DIAGNOSIS — R197 Diarrhea, unspecified: Secondary | ICD-10-CM | POA: Insufficient documentation

## 2021-02-25 MED ORDER — HYDROCOD POLST-CPM POLST ER 10-8 MG/5ML PO SUER: 5.0000 mL | Freq: Two times a day (BID) | ORAL | 0 refills | Status: DC | PRN

## 2021-02-25 MED ORDER — POLYSACCHARIDE IRON COMPLEX 150 MG PO CAPS: 150.0000 mg | ORAL_CAPSULE | ORAL | 2 refills | Status: DC

## 2021-02-25 NOTE — Patient Instructions (Signed)
Thank you for choosing [facility] to provide your care.   Should you have questions after your visit to the [facility] ([facility]), please contact this office at [phone] between 8:30 AM and 4:30 PM.  Voice mails left after 4:00 PM may not be returned until the following business day.  Calls received after 4:30 PM will be answered by an off-site Nurse Triage Line.    Prescription Refills:  Please have your pharmacy contact us directly for most prescription requests.  Contact the office directly for refills of narcotics (pain medications). Allow 48-72 hours for refills.  Appointments: Please contact the [facility] scheduling department [phone] for questions regarding [facility] appointment scheduling.  Contact the schedulers with any scheduling changes so that your appointment can be rescheduled in a timely manner.   Central Scheduling for [place] [phone] - Call to schedule procedures such as PET scans, CT scans, MRI, Ultrasound, etc.  To afford each patient quality time with our providers, please arrive 30 minutes before your scheduled appointment time.  If you arrive late for your appointment, you may be asked to reschedule.  We strive to give you quality time with our providers, and arriving late affects you and other patients whose appointments are after yours. If you are a no show for multiple scheduled visits, you may be dismissed from the clinic at the providers discretion.     Resources: [facility] Social Workers [phone] for additional information on assistance programs or assistance connecting with community support programs   [place] DSS  336-641-3447: Information regarding food stamps, Medicaid, and utility assistance [place] Access [place] 336-333-6589   [place] [employer]'s shared-ride transportation service for eligible riders who have a disability that prevents them from riding the fixed route bus.   Medicare Rights Center 800-333-4114  Helps people with Medicare understand their rights and benefits, navigate the Medicare system, and secure the quality healthcare they deserve [employer] 800-227-2345 Assists patients locate various types of support and financial assistance Cancer Care: 1-800-813-HOPE (4673) Provides financial assistance, online support groups, medication/co-pay assistance.   Transportation Assistance for appointments at [facility]: Transportation Coordinator [phone]  Again, thank you for choosing [facility] for your care.       

## 2021-02-25 NOTE — Progress Notes (Signed)
[facility]   HEMATOLOGY/ONCOLOGY CONSULTATION NOTE  Date of Service: 02/25/2021  Patient Care Team: [name], MD as PCP - General (Internal Medicine) Sueanne Margarita, MD as PCP - Cardiology (Cardiology)  CHIEF COMPLAINTS/PURPOSE OF CONSULTATION:  Iron deficiency anemia  HISTORY OF PRESENTING ILLNESS:   Alan [name] is a wonderful 80 y.o. male who has been referred to [place] by Dr Scarlette Calico for evaluation and management of iron deficiency anemia.  The patient has a history of metastatic small intestinal malignant carcinoid with significant chronic diarrhea for which she follows at [facility] onc[facility].  He notes that he is currently on lanreotide but this is not helping his diarrhea.  He also takes Lomotil and Imodium as needed without much effect.  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  He notes that he will talk with his primary oncologist about getting a dietary evaluation. Notes that he has lost between 30 and 40 pounds over the last 6 to 12 months and is concerned about this.  He notes no overt GI bleeding.  No hematuria.  No epistaxis.  No other visible evidence of blood loss.  His last labs done with results available in the system are from 02/15/2021 with the CBC showing a hemoglobin of 12.3 with an MCV of 92.6 with normal WBC count and platelets. B12 levels were 514 Ferritin was 163.6 with an iron saturation of 9.4 percent Folate was within normal limits Patient notes chronic fatigue.  He notes he is somewhat scared of eating since he has so much diarrhea. Wife is present with him during this interview.  MEDICAL HISTORY:  Past Medical History:  Diagnosis Date   Asthma    Carcinoid tumor, small intestine, malignant ([facility])    Chronic diarrhea    CKD (chronic kidney disease) stage 3, GFR 30-59 ml/min ([facility])    HTN (hypertension)    Short gut syndrome     SURGICAL HISTORY: Past Surgical History:  Procedure Laterality Date    COLON SURGERY      SOCIAL HISTORY: Social History   Socioeconomic History   Marital status: Married    Spouse name: Not on file   Number of children: Not on file   Years of education: Not on file   Highest education level: Not on file  Occupational History   Not on file  Tobacco Use   Smoking status: Former    Packs/day: 0.30    Years: 61.00    Pack years: 18.30    Types: Cigarettes    Start date: 72    Quit date: 03/03/2020    Years since quitting: 0.9   Smokeless tobacco: Never  Vaping Use   Vaping Use: Never used  Substance and Sexual Activity   Alcohol use: Not Currently   Drug use: No   Sexual activity: Not Currently  Other Topics Concern   Not on file  Social History Narrative   Not on file   Social Determinants of Health   Financial Resource Strain: Not on file  Food Insecurity: Not on file  Transportation Needs: Not on file  Physical Activity: Not on file  Stress: Not on file  Social Connections: Not on file  Intimate Partner Violence: Not on file    FAMILY HISTORY: Family History  Problem Relation Age of Onset   Alzheimer's disease Mother     ALLERGIES:  is allergic to penicillins and lisinopril.  MEDICATIONS:  Current Outpatient Medications  Medication Sig Dispense  Refill   aspirin 81 MG EC tablet Take 81 mg by mouth daily. Swallow whole.     chlorpheniramine-HYDROcodone (TUSSIONEX PENNKINETIC ER) 10-8 MG/5ML SUER Take 5 mLs by mouth every 12 (twelve) hours as needed for cough. 115 mL 0   LANREOTIDE ACETATE [place] Inject into the skin.     midodrine (PROAMATINE) 5 MG tablet Take 1 tablet (5 mg total) by mouth 3 (three) times daily with meals. 90 tablet 11   mirtazapine (REMERON) 7.5 MG tablet Take by mouth.     Multiple Vitamin (MULTIVITAMIN WITH MINERALS) TABS Take 1 tablet by mouth daily.     Multiple Vitamins-Minerals (PRESERVISION AREDS 2 PO) Take 1 capsule by mouth daily.     Nutritional Supplements (,FEEDING SUPPLEMENT, PROSOURCE PLUS)  liquid Take 30 mLs by mouth 3 (three) times daily between meals. 887 mL 0   SYMBICORT 160-4.5 MCG/ACT inhaler Inhale 2 puffs into the lungs at bedtime.     Vitamin D, Ergocalciferol, (DRISDOL) 1.25 MG (50000 UNIT) CAPS capsule Take 50,000 Units by mouth every 7 (seven) days.     No current facility-administered medications for this visit.    REVIEW OF SYSTEMS:    10 Point review of Systems was done is negative except as noted above.  PHYSICAL EXAMINATION: ECOG PERFORMANCE STATUS: 2 - Symptomatic, <50% confined to bed  . Vitals:   02/25/21 1113  BP: (!) 148/72  Pulse: (!) 47  Resp: 17  Temp: 98 F (36.7 C)  SpO2: 100%   Filed Weights   02/25/21 1113  Weight: 148 lb 6.4 oz (67.3 kg)   .Body mass index is 19.58 kg/m.  GENERAL:alert, in no acute distress and comfortable SKIN: no acute rashes, no significant lesions EYES: conjunctiva are pink and non-injected, sclera anicteric OROPHARYNX: MMM, no exudates, no oropharyngeal erythema or ulceration NECK: supple, no JVD LYMPH:  no palpable lymphadenopathy in the cervical, axillary or inguinal regions LUNGS: clear to auscultation b/l with normal respiratory effort HEART: regular rate & rhythm ABDOMEN:  normoactive bowel sounds , non tender, not distended. Extremity: no pedal edema PSYCH: alert & oriented x 3 with fluent speech NEURO: no focal motor/sensory deficits  LABORATORY DATA:  I have reviewed the data as listed  . CBC Latest Ref Rng & Units 02/19/2021 01/22/2021 01/19/2021  WBC 4.0 - 10.5 K/uL 7.2 8.6 8.2  Hemoglobin 13.0 - 17.0 g/dL 12.3(L) 12.7(L) 12.0(L)  Hematocrit 39.0 - 52.0 % 36.4(L) 35.7(L) 33.7(L)  Platelets 150.0 - 400.0 K/uL 179.0 138(L) 130(L)    . CMP Latest Ref Rng & Units 01/22/2021 01/19/2021 01/18/2021  Glucose 70 - 99 mg/dL 116(H) 127(H) 134(H)  BUN 8 - 23 mg/dL 26(H) 25(H) 25(H)  Creatinine 0.61 - 1.24 mg/dL 1.29(H) 1.17 1.22  Sodium 135 - 145 mmol/L 139 141 145  Potassium 3.5 - 5.1 mmol/L 3.7  3.5 3.1(L)  Chloride 98 - 111 mmol/L 107 109 115(H)  CO2 22 - 32 mmol/L 25 28 25   Calcium 8.9 - 10.3 mg/dL 8.6(L) 8.6(L) 7.5(L)  Total Protein 6.5 - 8.1 g/dL 6.0(L) - -  Total Bilirubin 0.3 - 1.2 mg/dL 1.0 - -  Alkaline Phos 38 - 126 U/L 40 - -  AST 15 - 41 U/L 31 - -  ALT 0 - 44 U/L 28 - -   . Lab Results  Component Value Date   IRON 21 (L) 02/19/2021   TIBC 224.0 (L) 02/19/2021   IRONPCTSAT 9.4 (L) 02/19/2021   (Iron and TIBC)  Lab Results  Component Value  Date   FERRITIN 163.6 02/19/2021   B12 514   RADIOGRAPHIC STUDIES: I have personally reviewed the radiological images as listed and agreed with the findings in the report. DG Chest 2 View  Result Date: 02/19/2021 CLINICAL DATA:  Productive cough. EXAM: CHEST - 2 VIEW COMPARISON:  01/16/2021 FINDINGS: The entire right costophrenic angle has not been included on the study. Within this limitation, there is no evidence for an pulmonary edema or pleural effusion. No focal airspace consolidation. Lungs appear hyperexpanded. The cardiopericardial silhouette is within normal limits for size. The visualized bony structures of the thorax show no acute abnormality. IMPRESSION: Hyperexpansion without acute cardiopulmonary findings. Electronically Signed   By: Misty Stanley M.D.   On: 02/19/2021 16:58    ASSESSMENT & PLAN:   80 year old male with  #1 metastatic small bowel malignant carcinoid with chronic significant diarrhea and weight loss. Patient is currently on lanreotide. Plan -Continue follow-up with his primary oncologist at [facility] : [facility] (Dr. Jonelle Sidle) -He notes that he will continue the lanreotide and as needed Lomotil and will discuss with his primary oncologist about consideration of dietary evaluation and other ways to address supportive management for his diarrhea and weight loss.  #2 referred for iron deficiency anemia. Patient's last labs from 02/15/2021 were reviewed Patient does not have any overt anemia  his ferritin levels are within normal limits more than 100. Iron saturation was low at 9.4%. He could be somewhat iron deficient with a ferritin being overestimated in the setting of possible liver metastases or anemia of chronic inflammation. Plan -Patient was recommended to follow-up with his primary hematologist/oncologist for consideration of IV iron. We discussed that consolidating his care keep it simple.  Follow-up RTC with Dr Irene Limbo with labs in 4 months  He will call us if he has any other questions for [place].  All of the patients questions were answered with apparent satisfaction. The patient knows to call the clinic with any problems, questions or concerns.  I spent 35 minutes counseling the patient face to face. The total time spent in the appointment was 45 minutes and more than 50% was on counseling and direct patient cares, reviewing his outside medical records    Sullivan Lone MD Petros AAHIVMS [facility] Hematology/Oncology Physician [facility]  (Office):       [phone] (Work cell):  [phone] (Fax):           [phone]  02/25/2021 11:31 AM

## 2021-02-26 ENCOUNTER — Telehealth: Payer: Self-pay | Admitting: Hematology

## 2021-02-26 NOTE — Telephone Encounter (Signed)
Left message with follow-up appointment per 8/30 los. 

## 2021-03-03 ENCOUNTER — Ambulatory Visit (INDEPENDENT_AMBULATORY_CARE_PROVIDER_SITE_OTHER): Payer: Medicare Other

## 2021-03-03 DIAGNOSIS — I951 Orthostatic hypotension: Secondary | ICD-10-CM | POA: Diagnosis not present

## 2021-03-03 DIAGNOSIS — R001 Bradycardia, unspecified: Secondary | ICD-10-CM

## 2021-03-04 ENCOUNTER — Ambulatory Visit (HOSPITAL_BASED_OUTPATIENT_CLINIC_OR_DEPARTMENT_OTHER): Payer: Medicare Other | Admitting: Family

## 2021-03-12 ENCOUNTER — Encounter: Payer: Self-pay | Admitting: Internal Medicine

## 2021-03-17 ENCOUNTER — Telehealth: Payer: Self-pay

## 2021-03-17 NOTE — Telephone Encounter (Signed)
Spoke to patient's wife advised we received monitor strips from 9/19 at 6:17 am revealed slow heart beat 39.6.Wife stated husband was unaware he was sleeping.Advised to continue to wear monitor.He is suppose to return monitor next week.

## 2021-03-18 ENCOUNTER — Telehealth: Payer: Self-pay

## 2021-03-18 ENCOUNTER — Other Ambulatory Visit: Payer: Self-pay

## 2021-03-18 DIAGNOSIS — R001 Bradycardia, unspecified: Secondary | ICD-10-CM

## 2021-03-18 NOTE — Telephone Encounter (Signed)
Spoke to patient's wife DOD Dr.Acharya reviewed 03/17/21 6:17 am monitor strips revealing slow heart rate 39.6.She advised see EP.Advised EP scheduler will call back with appointment.

## 2021-04-07 ENCOUNTER — Encounter (HOSPITAL_BASED_OUTPATIENT_CLINIC_OR_DEPARTMENT_OTHER): Payer: Self-pay

## 2021-04-21 ENCOUNTER — Other Ambulatory Visit: Payer: Self-pay

## 2021-04-21 ENCOUNTER — Encounter (HOSPITAL_BASED_OUTPATIENT_CLINIC_OR_DEPARTMENT_OTHER): Payer: Self-pay | Admitting: Cardiology

## 2021-04-21 ENCOUNTER — Ambulatory Visit (INDEPENDENT_AMBULATORY_CARE_PROVIDER_SITE_OTHER): Payer: Medicare Other | Admitting: Cardiology

## 2021-04-21 DIAGNOSIS — I2699 Other pulmonary embolism without acute cor pulmonale: Secondary | ICD-10-CM | POA: Diagnosis not present

## 2021-04-21 DIAGNOSIS — I441 Atrioventricular block, second degree: Secondary | ICD-10-CM | POA: Diagnosis not present

## 2021-04-21 DIAGNOSIS — C7A019 Malignant carcinoid tumor of the small intestine, unspecified portion: Secondary | ICD-10-CM | POA: Diagnosis not present

## 2021-04-21 DIAGNOSIS — I951 Orthostatic hypotension: Secondary | ICD-10-CM

## 2021-04-21 MED ORDER — MIDODRINE HCL 10 MG PO TABS: 10.0000 mg | ORAL_TABLET | Freq: Three times a day (TID) | ORAL | 3 refills | Status: DC

## 2021-04-21 NOTE — Assessment & Plan Note (Addendum)
Fairly significant orthostatics in the hospital in July:- orthostatic vital lying 161/84 HR 47, sitting 147/83 HR 53, standing 115/70 HR 60, standing 3 min 118/68 HR 73  -Echo showed EF 55-60% with basal-septal hypertrophy, no significant valve issue - compression stocking and abdominal binder - tamsulosin and flomax discontinued  -He has been taking midodrine at max dose 10 mg 3 times a day with meals.  Continue.

## 2021-04-21 NOTE — Assessment & Plan Note (Signed)
Small PE noted in the hospital setting in July.  Not on anticoagulation due to higher risk versus benefit.  No evidence of DVT on lower extremity duplex.

## 2021-04-21 NOTE — Progress Notes (Signed)
Cardiology Office Note:    Date:  04/21/2021   ID:  Alan [name], DOB 07/01/1940, MRN [id number]  PCP:  [name], MD   [facility] Providers Cardiologist:  Fransico Him, MD     Referring MD: [name], MD   History of Present Illness:    Alan [name] is a 80 y.o. male here for follow-up and to discuss results of their recent monitor (02/2021) per Dr. Martinique.  He was admitted in 12/2020 for presyncope. Last seen by Dr. Martinique on 02/20/2021 for post-hospital follow-up. He had no recurrent syncope or near-syncope at that time. A 3 week event monitor was ordered.  Today, he is accompanied by his wife.  They raised his dose of Midodrine to 10 mg. However, he does not feel it is working consistently. He is also experiencing "sick spells" where he feels like he is going to faint, which he believes is caused by the Midodrine.    Also had a fall that he does not associate with near-syncope while stepping out of the shower. He felt very weak and cold at that time.  His wife reports that he is not eating much. He states this is in part due to having excess gas build-up. Also continues to notice a tar-like color in his stool.  He is scheduled to see Dr. [name] on Wednesday.  He denies any palpitations, chest pain, or shortness of breath. No lightheadedness, headaches, orthopnea, or PND. Also has no lower extremity edema or exertional symptoms.   Past Medical History:  Diagnosis Date   Asthma    Carcinoid tumor, small intestine, malignant (HCC)    Chronic diarrhea    CKD (chronic kidney disease) stage 3, GFR 30-59 ml/min (HCC)    HTN (hypertension)    Short gut syndrome     Past Surgical History:  Procedure Laterality Date   COLON SURGERY      Current Medications: Current Meds  Medication Sig   aspirin 81 MG EC tablet Take 81 mg by mouth daily. Swallow whole.   iron polysaccharides (NIFEREX) 150 MG capsule Take 1 capsule (150 mg total) by mouth 3 (three) times  a week.   LANREOTIDE ACETATE [place] Inject into the skin.   midodrine (PROAMATINE) 10 MG tablet Take 1 tablet (10 mg total) by mouth 3 (three) times daily.   mirtazapine (REMERON) 7.5 MG tablet Take by mouth.   Multiple Vitamin (MULTIVITAMIN WITH MINERALS) TABS Take 1 tablet by mouth daily.   Multiple Vitamins-Minerals (PRESERVISION AREDS 2 PO) Take 1 capsule by mouth daily.   SYMBICORT 160-4.5 MCG/ACT inhaler Inhale 2 puffs into the lungs at bedtime.   [DISCONTINUED] midodrine (PROAMATINE) 5 MG tablet Take 5 mg by mouth 3 (three) times daily with meals. Per patient taking 10 mg three times a day     Allergies:   Penicillins and Lisinopril   Social History   Socioeconomic History   Marital status: Married    Spouse name: Not on file   Number of children: Not on file   Years of education: Not on file   Highest education level: Not on file  Occupational History   Not on file  Tobacco Use   Smoking status: Former    Packs/day: 0.30    Years: 61.00    Pack years: 18.30    Types: Cigarettes    Start date: 27    Quit date: 03/03/2020    Years since quitting: 1.1   Smokeless tobacco: Never  Vaping  Use   Vaping Use: Never used  Substance and Sexual Activity   Alcohol use: Not Currently   Drug use: No   Sexual activity: Not Currently  Other Topics Concern   Not on file  Social History Narrative   Not on file   Social Determinants of Health   Financial Resource Strain: Not on file  Food Insecurity: Not on file  Transportation Needs: Not on file  Physical Activity: Not on file  Stress: Not on file  Social Connections: Not on file     Family History: The patient's family history includes Alzheimer's disease in his mother.  ROS:   Please see the history of present illness.    (+) Near-syncope (+) Mechanical Fall (+) Loss of appetite All other systems reviewed and are negative.  EKGs/Labs/Other Studies Reviewed:    The following studies were reviewed today:  Monitor  03/28/2021: Sinus rhythm with average heart rate of 53 bpm Rare PVCs, 2% of of overall beats, rare PACs less than 1%. Minimum heart rate 33 bpm sinus rhythm at 8:11 AM on day #13-no symptoms associated with this. 7 beat run of slow ventricular tachycardia at 105 bpm at 4:40 AM, no symptoms reported Slow idioventricular rhythm heart rate in the 30s lasting 6 to 7 seconds in duration-asymptomatic, recorded during sleep 2-1 AV block 36 bpm 8:15 AM day #15 noted. Asymptomatic. Critical value reported.   Consider further evaluation by electrophysiology given underlying conduction disease.  LE Venous DVT 01/19/2021: Summary:  BILATERAL:  - No evidence of deep vein thrombosis seen in the lower extremities,  bilaterally.  - No evidence of superficial venous thrombosis in the lower extremities,  bilaterally.  -  RIGHT:  - No cystic structure found in the popliteal fossa.     LEFT:  - A cystic structure is found in the popliteal fossa.  Echo 01/17/2021:  1. Left ventricular ejection fraction, by estimation, is 55 to 60%. The  left ventricle has normal function. The left ventricle has no regional  wall motion abnormalities. There is mild left ventricular hypertrophy of  the basal-septal segment. Left  ventricular diastolic parameters were normal.   2. Right ventricular systolic function is normal. The right ventricular  size is normal.   3. The mitral valve is normal in structure. No evidence of mitral valve  regurgitation. No evidence of mitral stenosis.   4. The aortic valve is tricuspid. Aortic valve regurgitation is not  visualized. No aortic stenosis is present.   5. The inferior vena cava is normal in size with greater than 50%  respiratory variability, suggesting right atrial pressure of 3 mmHg.   CTA Chest 01/16/2021: COMPARISON:  04/17/2010   FINDINGS: Cardiovascular: Atherosclerotic calcifications of the aorta are noted. No significant aortic opacification is identified to  suggest dissection. No aneurysmal dilatation is noted. No cardiac enlargement is seen. Scattered coronary calcifications are noted. The pulmonary artery shows a normal branching pattern bilaterally. Tiny right middle lobe pulmonary emboli are seen. No other significant emboli are noted. No right heart strain is seen.   Mediastinum/Nodes: Thoracic inlet is within normal limits. No sizable hilar or mediastinal adenopathy is noted. The esophagus demonstrates some retained fluid and food stuffs. This may be related to reflux.   Lungs/Pleura: Lungs are well aerated bilaterally. Minimal dependent atelectatic changes are seen. No focal infiltrate or effusion is noted. No evidence of metastatic disease is seen.   Upper Abdomen: Visualized upper abdomen shows renal cystic change on the left as well as  cysts within the left lobe of the liver. No other focal abnormality in the upper abdomen is seen.   Musculoskeletal: Degenerative changes of the thoracic spine are   Review of the MIP images confirms the above findings.   IMPRESSION: Small pulmonary emboli involving the right middle lobe as described.   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   Aortic Atherosclerosis (ICD10-I70.0).  EKG:  EKG is personally reviewed and interpreted. 04/21/2021: EKG was not ordered today.   Recent Labs: 01/22/2021: ALT 28; BUN 26; Creatinine, Ser 1.29; Magnesium 1.5; Potassium 3.7; Sodium 139 02/19/2021: Hemoglobin 12.3; Platelets 179.0; TSH 0.63   Recent Lipid Panel No results found for: CHOL, TRIG, HDL, CHOLHDL, VLDL, LDLCALC, LDLDIRECT   Risk Assessment/Calculations:          Physical Exam:    VS:  BP 120/80   Pulse (!) 52   Ht 6\' 1"  (1.854 m)   Wt 143 lb 12.8 oz (65.2 kg)   SpO2 90%   BMI 18.97 kg/m     Wt Readings from Last 3 Encounters:  04/21/21 143 lb 12.8 oz (65.2 kg)  02/25/21 148 lb 6.4 oz (67.3 kg)  02/20/21 147 lb 9.6 oz (67 kg)     GEN: Thin in no acute  distress HEENT: Normal NECK: No JVD; No carotid bruits LYMPHATICS: No lymphadenopathy CARDIAC: RRR, no murmurs, rubs, gallops RESPIRATORY:  Clear to auscultation without rales, wheezing or rhonchi  ABDOMEN: Soft, non-tender, non-distended MUSCULOSKELETAL:  No edema; No deformity  SKIN: Warm and dry NEUROLOGIC:  Alert and oriented x 3 PSYCHIATRIC:  Normal affect   ASSESSMENT:    1. AV block, 2nd degree   2. Orthostatic hypotension   3. Other acute pulmonary embolism without acute cor pulmonale ([place])   4. Malignant carcinoid tumor of small intestine, unspecified location [facility])    PLAN:    In order of problems listed above: AV block, 2nd degree Monitor reviewed as above.  We will have electrophysiology review in consultation.  Previously in the hospital setting in July heart rates were persistently in the 40s.  By inference, his syncope was not likely attributable to bradycardia.  I would like EP to review.  Orthostatics most likely playing a role.  Orthostatic hypotension Fairly significant orthostatics in the hospital in July:- orthostatic vital lying 161/84 HR 47, sitting 147/83 HR 53, standing 115/70 HR 60, standing 3 min 118/68 HR 73  -Echo showed EF 55-60% with basal-septal hypertrophy, no significant valve issue - compression stocking and abdominal binder - tamsulosin and flomax discontinued  -He has been taking midodrine at max dose 10 mg 3 times a day with meals.  Continue.  Acute pulmonary embolism (HCC) Small PE noted in the hospital setting in July.  Not on anticoagulation due to higher risk versus benefit.  No evidence of DVT on lower extremity duplex.  Malignant carcinoid tumor of small intestine [facility]) Per oncology team.  Longstanding issue.     Follow-up: 6 months.  Medication Adjustments/Labs and Tests Ordered: Current medicines are reviewed at length with the patient today.  Concerns regarding medicines are outlined above.  No orders of the defined types  were placed in this encounter.   Meds ordered this encounter  Medications   midodrine (PROAMATINE) 10 MG tablet    Sig: Take 1 tablet (10 mg total) by mouth 3 (three) times daily.    Dispense:  270 tablet    Refill:  3     Patient Instructions  Medication Instructions:  The  current medical regimen is effective;  continue present plan and medications.  *If you need a refill on your cardiac medications before your next appointment, please call your pharmacy*  Follow-Up: At [facility], you and your health needs are our priority.  As part of our continuing mission to provide you with exceptional heart care, we have created designated Provider Care Teams.  These Care Teams include your primary Cardiologist (physician) and Advanced Practice Providers (APPs -  Physician Assistants and Nurse Practitioners) who all work together to provide you with the care you need, when you need it.  We recommend signing up for the patient portal called "MyChart".  Sign up information is provided on this After Visit Summary.  MyChart is used to connect with patients for Virtual Visits (Telemedicine).  Patients are able to view lab/test results, encounter notes, upcoming appointments, etc.  Non-urgent messages can be sent to your provider as well.   To learn more about what you can do with MyChart, go to [email or web address].    Your next appointment:   6 month(s)  The format for your next appointment:   In Person  Provider:   Candee Furbish, MD  Thank you for choosing [place]!!     I,Mathew Stumpf,acting as a scribe for Candee Furbish, MD.,have documented all relevant documentation on the behalf of Candee Furbish, MD,as directed by  Candee Furbish, MD while in the presence of Candee Furbish, MD.  I, Candee Furbish, MD, have reviewed all documentation for this visit. The documentation on 04/21/21 for the exam, diagnosis, procedures, and orders are all accurate and complete.   Signed, Candee Furbish,  MD  04/21/2021 10:16 AM    [place]

## 2021-04-21 NOTE — Assessment & Plan Note (Signed)
Monitor reviewed as above.  We will have electrophysiology review in consultation.  Previously in the hospital setting in July heart rates were persistently in the 40s.  By inference, his syncope was not likely attributable to bradycardia.  I would like EP to review.  Orthostatics most likely playing a role.

## 2021-04-21 NOTE — Patient Instructions (Signed)

## 2021-04-21 NOTE — Assessment & Plan Note (Signed)
Per oncology team.  Longstanding issue.

## 2021-04-22 ENCOUNTER — Inpatient Hospital Stay (HOSPITAL_COMMUNITY)
Admission: EM | Admit: 2021-04-22 | Discharge: 2021-04-24 | DRG: 243 | Disposition: A | Discharge status: Home / Self Care | Payer: Medicare Other | Attending: Internal Medicine | Admitting: Internal Medicine

## 2021-04-22 ENCOUNTER — Encounter (HOSPITAL_COMMUNITY): Payer: Self-pay | Admitting: Emergency Medicine

## 2021-04-22 ENCOUNTER — Emergency Department (HOSPITAL_COMMUNITY): Payer: Medicare Other

## 2021-04-22 ENCOUNTER — Other Ambulatory Visit: Payer: Self-pay

## 2021-04-22 DIAGNOSIS — I4891 Unspecified atrial fibrillation: Secondary | ICD-10-CM | POA: Diagnosis not present

## 2021-04-22 DIAGNOSIS — Z7982 Long term (current) use of aspirin: Secondary | ICD-10-CM

## 2021-04-22 DIAGNOSIS — I951 Orthostatic hypotension: Secondary | ICD-10-CM | POA: Diagnosis present

## 2021-04-22 DIAGNOSIS — Z85060 Personal history of malignant carcinoid tumor of small intestine: Secondary | ICD-10-CM

## 2021-04-22 DIAGNOSIS — R001 Bradycardia, unspecified: Secondary | ICD-10-CM

## 2021-04-22 DIAGNOSIS — F329 Major depressive disorder, single episode, unspecified: Secondary | ICD-10-CM | POA: Diagnosis present

## 2021-04-22 DIAGNOSIS — I129 Hypertensive chronic kidney disease with stage 1 through stage 4 chronic kidney disease, or unspecified chronic kidney disease: Secondary | ICD-10-CM | POA: Diagnosis present

## 2021-04-22 DIAGNOSIS — K529 Noninfective gastroenteritis and colitis, unspecified: Secondary | ICD-10-CM

## 2021-04-22 DIAGNOSIS — Z8616 Personal history of COVID-19: Secondary | ICD-10-CM

## 2021-04-22 DIAGNOSIS — E876 Hypokalemia: Secondary | ICD-10-CM | POA: Diagnosis present

## 2021-04-22 DIAGNOSIS — Z7951 Long term (current) use of inhaled steroids: Secondary | ICD-10-CM

## 2021-04-22 DIAGNOSIS — Z87891 Personal history of nicotine dependence: Secondary | ICD-10-CM

## 2021-04-22 DIAGNOSIS — Z880 Allergy status to penicillin: Secondary | ICD-10-CM

## 2021-04-22 DIAGNOSIS — I472 Ventricular tachycardia, unspecified: Secondary | ICD-10-CM | POA: Diagnosis not present

## 2021-04-22 DIAGNOSIS — R55 Syncope and collapse: Secondary | ICD-10-CM | POA: Diagnosis present

## 2021-04-22 DIAGNOSIS — N1831 Chronic kidney disease, stage 3a: Secondary | ICD-10-CM | POA: Diagnosis not present

## 2021-04-22 DIAGNOSIS — J45909 Unspecified asthma, uncomplicated: Secondary | ICD-10-CM | POA: Diagnosis present

## 2021-04-22 DIAGNOSIS — Z820 Family history of epilepsy and other diseases of the nervous system: Secondary | ICD-10-CM

## 2021-04-22 DIAGNOSIS — I495 Sick sinus syndrome: Principal | ICD-10-CM | POA: Diagnosis present

## 2021-04-22 DIAGNOSIS — I480 Paroxysmal atrial fibrillation: Secondary | ICD-10-CM | POA: Diagnosis present

## 2021-04-22 DIAGNOSIS — Z888 Allergy status to other drugs, medicaments and biological substances status: Secondary | ICD-10-CM

## 2021-04-22 DIAGNOSIS — Z95818 Presence of other cardiac implants and grafts: Secondary | ICD-10-CM

## 2021-04-22 DIAGNOSIS — K912 Postsurgical malabsorption, not elsewhere classified: Secondary | ICD-10-CM | POA: Diagnosis present

## 2021-04-22 DIAGNOSIS — I441 Atrioventricular block, second degree: Secondary | ICD-10-CM | POA: Diagnosis present

## 2021-04-22 DIAGNOSIS — Z79899 Other long term (current) drug therapy: Secondary | ICD-10-CM

## 2021-04-22 DIAGNOSIS — C7A019 Malignant carcinoid tumor of the small intestine, unspecified portion: Secondary | ICD-10-CM

## 2021-04-22 DIAGNOSIS — R778 Other specified abnormalities of plasma proteins: Secondary | ICD-10-CM | POA: Diagnosis present

## 2021-04-22 LAB — CBC WITH DIFFERENTIAL/PLATELET
Abs Immature Granulocytes: 0.05 10*3/uL (ref 0.00–0.07)
Basophils Absolute: 0 10*3/uL (ref 0.0–0.1)
Basophils Relative: 0 %
Eosinophils Absolute: 0.1 10*3/uL (ref 0.0–0.5)
Eosinophils Relative: 0 %
HCT: 33.9 % — ABNORMAL LOW (ref 39.0–52.0)
Hemoglobin: 11.9 g/dL — ABNORMAL LOW (ref 13.0–17.0)
Immature Granulocytes: 0 %
Lymphocytes Relative: 7 %
Lymphs Abs: 0.8 10*3/uL (ref 0.7–4.0)
MCH: 31.5 pg (ref 26.0–34.0)
MCHC: 35.1 g/dL (ref 30.0–36.0)
MCV: 89.7 fL (ref 80.0–100.0)
Monocytes Absolute: 0.8 10*3/uL (ref 0.1–1.0)
Monocytes Relative: 7 %
Neutro Abs: 9.5 10*3/uL — ABNORMAL HIGH (ref 1.7–7.7)
Neutrophils Relative %: 86 %
Platelets: 171 10*3/uL (ref 150–400)
RBC: 3.78 MIL/uL — ABNORMAL LOW (ref 4.22–5.81)
RDW: 12.7 % (ref 11.5–15.5)
WBC: 11.3 10*3/uL — ABNORMAL HIGH (ref 4.0–10.5)
nRBC: 0 % (ref 0.0–0.2)

## 2021-04-22 LAB — BASIC METABOLIC PANEL
Anion gap: 11 (ref 5–15)
BUN: 30 mg/dL — ABNORMAL HIGH (ref 8–23)
CO2: 28 mmol/L (ref 22–32)
Calcium: 8.8 mg/dL — ABNORMAL LOW (ref 8.9–10.3)
Chloride: 102 mmol/L (ref 98–111)
Creatinine, Ser: 1.74 mg/dL — ABNORMAL HIGH (ref 0.61–1.24)
GFR, Estimated: 39 mL/min — ABNORMAL LOW (ref >60)
Glucose, Bld: 141 mg/dL — ABNORMAL HIGH (ref 70–99)
Potassium: 2.8 mmol/L — ABNORMAL LOW (ref 3.5–5.1)
Sodium: 141 mmol/L (ref 135–145)

## 2021-04-22 LAB — MAGNESIUM: Magnesium: 1.8 mg/dL (ref 1.7–2.4)

## 2021-04-22 LAB — TROPONIN I (HIGH SENSITIVITY)
Troponin I (High Sensitivity): 56 ng/L — ABNORMAL HIGH (ref <18)
Troponin I (High Sensitivity): 122 ng/L (ref <18)

## 2021-04-22 LAB — TSH: TSH: 1.591 u[IU]/mL (ref 0.350–4.500)

## 2021-04-22 LAB — RESP PANEL BY RT-PCR (FLU A&B, COVID) ARPGX2
Influenza A by PCR: NEGATIVE
Influenza B by PCR: NEGATIVE
SARS Coronavirus 2 by RT PCR: NEGATIVE

## 2021-04-22 MED ORDER — ONDANSETRON HCL 4 MG/2ML IJ SOLN: 4.0000 mg | Freq: Four times a day (QID) | INTRAMUSCULAR | Status: DC | PRN

## 2021-04-22 MED ORDER — ACETAMINOPHEN 325 MG PO TABS: 650.0000 mg | ORAL_TABLET | Freq: Four times a day (QID) | ORAL | Status: DC | PRN

## 2021-04-22 MED ORDER — MIRTAZAPINE 7.5 MG PO TABS
7.5000 mg | ORAL_TABLET | Freq: Every day | ORAL | Status: DC
  Administered 2021-04-22 – 2021-04-23 (×2): 7.5 mg via ORAL
  Filled 2021-04-22 (×3): qty 1

## 2021-04-22 MED ORDER — POTASSIUM CHLORIDE CRYS ER 20 MEQ PO TBCR
40.0000 meq | EXTENDED_RELEASE_TABLET | ORAL | Status: AC
  Administered 2021-04-22 – 2021-04-23 (×2): 40 meq via ORAL
  Filled 2021-04-22 (×2): qty 2

## 2021-04-22 MED ORDER — DILTIAZEM HCL 25 MG/5ML IV SOLN
10.0000 mg | Freq: Once | INTRAVENOUS | Status: DC
  Filled 2021-04-22: qty 5

## 2021-04-22 MED ORDER — ASPIRIN EC 81 MG PO TBEC
81.0000 mg | DELAYED_RELEASE_TABLET | Freq: Every day | ORAL | Status: DC
  Administered 2021-04-22 – 2021-04-24 (×3): 81 mg via ORAL
  Filled 2021-04-22 (×3): qty 1

## 2021-04-22 MED ORDER — SODIUM CHLORIDE 0.9 % IV SOLN: INTRAVENOUS | Status: DC | PRN

## 2021-04-22 MED ORDER — LACTATED RINGERS IV SOLN: INTRAVENOUS | Status: AC

## 2021-04-22 MED ORDER — MAGNESIUM SULFATE 2 GM/50ML IV SOLN
2.0000 g | Freq: Once | INTRAVENOUS | Status: AC
  Administered 2021-04-22: 2 g via INTRAVENOUS
  Filled 2021-04-22: qty 50

## 2021-04-22 MED ORDER — ACETAMINOPHEN 650 MG RE SUPP: 650.0000 mg | Freq: Four times a day (QID) | RECTAL | Status: DC | PRN

## 2021-04-22 MED ORDER — SODIUM CHLORIDE 0.9 % IV BOLUS
1000.0000 mL | Freq: Once | INTRAVENOUS | Status: AC
  Administered 2021-04-22: 1000 mL via INTRAVENOUS

## 2021-04-22 MED ORDER — MOMETASONE FURO-FORMOTEROL FUM 200-5 MCG/ACT IN AERO
2.0000 | INHALATION_SPRAY | Freq: Two times a day (BID) | RESPIRATORY_TRACT | Status: DC
  Administered 2021-04-23 – 2021-04-24 (×2): 2 via RESPIRATORY_TRACT
  Filled 2021-04-22 (×2): qty 8.8

## 2021-04-22 MED ORDER — LACTATED RINGERS IV SOLN: INTRAVENOUS | Status: DC

## 2021-04-22 MED ORDER — POTASSIUM CHLORIDE 10 MEQ/100ML IV SOLN
10.0000 meq | INTRAVENOUS | Status: AC
  Administered 2021-04-22 (×2): 10 meq via INTRAVENOUS
  Filled 2021-04-22: qty 100

## 2021-04-22 MED ORDER — MIDODRINE HCL 5 MG PO TABS
10.0000 mg | ORAL_TABLET | Freq: Three times a day (TID) | ORAL | Status: DC
  Administered 2021-04-22 – 2021-04-23 (×2): 10 mg via ORAL
  Filled 2021-04-22 (×2): qty 2

## 2021-04-22 MED ORDER — ONDANSETRON HCL 4 MG PO TABS: 4.0000 mg | ORAL_TABLET | Freq: Four times a day (QID) | ORAL | Status: DC | PRN

## 2021-04-22 NOTE — Assessment & Plan Note (Signed)
   Presentation with rapid atrial fibrillation with heart rates in excess of 140 bpm  Spontaneous conversion to bradycardia without intervention  Etiology possibly secondary to volume depletion, electrolyte disturbance or possible sinus node dysfunction  Avoiding anticoagulation for now as patient has history of bleeding complications in the past  Avoiding AV nodal blocking agents for now as patient is suffering from substantial bradycardia with heart rate in the low 40s currently.  Monitoring patient on telemetry for recurrence  Correcting electrolyte disturbances including hypokalemia and hypomagnesemia  Cardiology to evaluate in the morning  Cycling cardiac enzymes  TSH unremarkable  Echocardiogram already performed in July

## 2021-04-22 NOTE — Assessment & Plan Note (Signed)
   Several month history of orthostatic hypotension currently on midodrine which will be continued at this time  Presence of diagnosis of orthostatic hypotension with certainly complicates things assessment for syncope as well.

## 2021-04-22 NOTE — ED Notes (Signed)
Patient transported to CT 

## 2021-04-22 NOTE — ED Notes (Signed)
Pt now in Sinus bradycardia rhythm. Trifan MD aware

## 2021-04-22 NOTE — Assessment & Plan Note (Signed)
   Longstanding chronic diarrhea secondary to short gut syndrome  This is likely precipitating ongoing relative volume depletion and electrolyte disturbances  Patient receives outpatient lanreotide injections  Will provide with as needed antimotility agents if necessary

## 2021-04-22 NOTE — Assessment & Plan Note (Addendum)
   Substantial hypokalemia of 2.8, possibly secondary to chronic diarrhea  Replacing with both oral and intravenous potassium chloride  Concurrently replacing magnesium sulfate  Uncertain as to how much patient's electrolyte disturbance contributed to rapid atrial fibrillation.  Monitoring electrolytes closely with serial chemistries

## 2021-04-22 NOTE — Assessment & Plan Note (Signed)
.   Strict intake and output monitoring . Baseline creatinine somewhat unclear however I believe that it is a near baseline. . Minimizing nephrotoxic agents as much as possible . Serial chemistries to monitor renal function and electrolytes

## 2021-04-22 NOTE — ED Triage Notes (Addendum)
Pt here from home via [facility] for witnessed syncopal episode at home, pt tried to sit down after feeling lightheaded but fell onto L side, hit head on door, no thinners. Pt has hx of bradycardia and orthostatic BP, on midodrine. Pt saw cardiologist yesterday and has appt for possible pacemaker tomorrow. Upon EMS arrival pt was in afib RVR w/ rate 120-160s. Pt denies hx of afib. GCS 15, 18g L FA.

## 2021-04-22 NOTE — ED Provider Notes (Signed)
[facility] EMERGENCY DEPARTMENT Provider Note   CSN: [id number] Arrival date & time: 04/22/21  1549     History Chief Complaint  Patient presents with   Loss of Consciousness   Afib RVR    Alan [name] is a 80 y.o. male with a history of CKD, carcinoid tumor of the small intestine, orthostatic hypotension, presenting to emergency department with concern for syncope.  The patient reports he been feeling particularly woozy headed and unwell for the past 24 hours, although it is unusual for him to feel this on a daily basis.  He attributed this to low blood pressure.  He states that he tried to sit up today and began to feel very lightheaded, fell forward and struck his head on the door, with brief loss of consciousness afterwards.  This was witnessed by his family.  EMS arrived to the house for the patient was in A. fib with RVR with a heart rate ranging from 120 to 160 bpm.  No medications were given in route.    Patient reports mild lightheadedness in bed, otherwise feels well at this time.  He denies any prior history of A. fib.  He reports that he was on anticoagulation with Eliquis in the past for pulmonary embolism was taken off of it due to anemia issues.  I confirmed this per his medical record review in his office note from Dr. Martinique.  No prior known hx of A Fib  He is on midodrine 10 mg TID for hypotension.  He reports he is pending evaluation with Dr [name] for possible pacemaker (due to symptomatic bradycardia (?) has appointment scheduled tomorrow.)  HR usually runs 40-50's  Last echo 01/17/21, EF 55-60%  HPI     Past Medical History:  Diagnosis Date   Asthma    Carcinoid tumor, small intestine, malignant (HCC)    Chronic diarrhea    CKD (chronic kidney disease) stage 3, GFR 30-59 ml/min (HCC)    HTN (hypertension)    Short gut syndrome     Patient Active Problem List   Diagnosis Date Noted   Syncope 04/22/2021   Major depressive  disorder 04/22/2021   Elevated troponin level not due myocardial infarction 04/22/2021   Sinus node dysfunction ([place]) 04/22/2021   Atrial fibrillation with rapid ventricular response ([place]) 04/22/2021   AV block, 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   Orthostatic hypotension 02/20/2021   Acute bronchitis due to 2019 novel coronavirus 02/20/2021   Iron deficiency anemia due to chronic blood loss 02/20/2021   Deficiency anemia 02/19/2021   Weight loss, abnormal 02/19/2021   Productive cough 02/19/2021   Near syncope 01/18/2021   Chronic diarrhea 01/18/2021   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[place]) 01/18/2021   Bradycardia 01/18/2021   GIB (gastrointestinal bleeding) 01/18/2021   Essential hypertension 01/18/2021   Hypokalemia due to excessive gastrointestinal loss of potassium 01/18/2021   Chronic kidney disease, stage 3a ([place]) 01/18/2021   Acute pulmonary embolism ([place]) 01/16/2021    Past Surgical History:  Procedure Laterality Date   COLON SURGERY         Family History  Problem Relation Age of Onset   Alzheimer's disease Mother     Social History   Tobacco Use   Smoking status: Former    Packs/day: 0.30    Years: 61.00    Pack years: 18.30    Types: Cigarettes    Start date: 7    Quit date: 03/03/2020    Years since quitting: 1.1  Smokeless tobacco: Never  Vaping Use   Vaping Use: Never used  Substance Use Topics   Alcohol use: Not Currently   Drug use: No    Home Medications Prior to Admission medications   Medication Sig Start Date End Date Taking? Authorizing Provider  aspirin 81 MG EC tablet Take 81 mg by mouth daily. Swallow whole.   Yes [provider]  Ensure Plus (ENSURE PLUS) LIQD Take 237 mLs by mouth 2 (two) times daily between meals.   Yes [provider]  iron polysaccharides (NIFEREX) 150 MG capsule Take 1 capsule (150 mg total) by mouth 3 (three) times a week. Patient taking differently: Take 150 mg by mouth daily. 02/26/21  Yes  Brunetta Genera, MD  LANREOTIDE ACETATE [place] Inject 1 Dose into the skin every 30 (thirty) days.   Yes [provider]  midodrine (PROAMATINE) 10 MG tablet Take 1 tablet (10 mg total) by mouth 3 (three) times daily. 04/21/21  Yes Jerline Pain, MD  mirtazapine (REMERON) 7.5 MG tablet Take 7.5 mg by mouth daily. 02/18/21  Yes [provider]  Multiple Vitamin (MULTIVITAMIN WITH MINERALS) TABS Take 1 tablet by mouth daily.   Yes [provider]  Multiple Vitamins-Minerals (PRESERVISION AREDS 2 PO) Take 1 capsule by mouth daily.   Yes [provider]  SYMBICORT 160-4.5 MCG/ACT inhaler Inhale 2 puffs into the lungs daily as needed (wheezing). 10/31/20  Yes [provider]  chlorpheniramine-HYDROcodone (TUSSIONEX PENNKINETIC ER) 10-8 MG/5ML SUER Take 5 mLs by mouth every 12 (twelve) hours as needed for cough. Patient not taking: No sig reported 02/25/21   [name], MD  Nutritional Supplements (,FEEDING SUPPLEMENT, PROSOURCE PLUS) liquid Take 30 mLs by mouth 3 (three) times daily between meals. Patient not taking: No sig reported 01/22/21   [name], DO  Vitamin D, Ergocalciferol, (DRISDOL) 1.25 MG (50000 UNIT) CAPS capsule Take 50,000 Units by mouth every 7 (seven) days. Patient not taking: No sig reported 07/08/20   [provider]    Allergies    Penicillins and Lisinopril  Review of Systems   Review of Systems  Constitutional:  Negative for chills and fever.  HENT:  Negative for ear pain and sore throat.   Eyes:  Negative for pain and visual disturbance.  Respiratory:  Positive for shortness of breath. Negative for cough.   Cardiovascular:  Positive for palpitations. Negative for chest pain.  Gastrointestinal:  Negative for abdominal pain and vomiting.  Musculoskeletal:  Negative for arthralgias and back pain.  Skin:  Negative for color change and rash.  Neurological:  Negative for syncope and headaches.  All other  systems reviewed and are negative.  Physical Exam Updated Vital Signs BP (!) 165/84   Pulse (!) 46   Temp 97.7 F (36.5 C) (Oral)   Resp 16   Ht 6\' 1"  (1.854 m)   Wt 65 kg   SpO2 95%   BMI 18.91 kg/m   Physical Exam Constitutional:      General: He is not in acute distress. HENT:     Head: Normocephalic and atraumatic.  Eyes:     Conjunctiva/sclera: Conjunctivae normal.     Pupils: Pupils are equal, round, and reactive to light.  Cardiovascular:     Rate and Rhythm: Tachycardia present. Rhythm irregular.  Pulmonary:     Effort: Pulmonary effort is normal. No respiratory distress.  Abdominal:     General: There is no distension.     Tenderness: There is  no abdominal tenderness.  Skin:    General: Skin is warm and dry.  Neurological:     General: No focal deficit present.     Mental Status: He is alert and oriented to person, place, and time. Mental status is at baseline.  Psychiatric:        Mood and Affect: Mood normal.        Behavior: Behavior normal.    ED Results / Procedures / Treatments   Labs (all labs ordered are listed, but only abnormal results are displayed) Labs Reviewed  BASIC METABOLIC PANEL - Abnormal; Notable for the following components:      Result Value   Potassium 2.8 (*)    Glucose, Bld 141 (*)    BUN 30 (*)    Creatinine, Ser 1.74 (*)    Calcium 8.8 (*)    GFR, Estimated 39 (*)    All other components within normal limits  CBC WITH DIFFERENTIAL/PLATELET - Abnormal; Notable for the following components:   WBC 11.3 (*)    RBC 3.78 (*)    Hemoglobin 11.9 (*)    HCT 33.9 (*)    Neutro Abs 9.5 (*)    All other components within normal limits  TROPONIN I (HIGH SENSITIVITY) - Abnormal; Notable for the following components:   Troponin I (High Sensitivity) 56 (*)    All other components within normal limits  TROPONIN I (HIGH SENSITIVITY) - Abnormal; Notable for the following components:   Troponin I (High Sensitivity) 122 (*)    All other  components within normal limits  TROPONIN I (HIGH SENSITIVITY) - Abnormal; Notable for the following components:   Troponin I (High Sensitivity) 124 (*)    All other components within normal limits  RESP PANEL BY RT-PCR (FLU A&B, COVID) ARPGX2  SURGICAL PCR SCREEN  TSH  MAGNESIUM  BASIC METABOLIC PANEL    EKG EKG Interpretation  Date/Time:  Tuesday April 22 2021 17:49:05 EDT Ventricular Rate:  44 PR Interval:  169 QRS Duration: 108 QT Interval:  473 QTC Calculation: 405 R Axis:   78 Text Interpretation: Sinus bradycardia No sig change from prior tracing Apr 22 2021, more bradycardic Confirmed by Octaviano Glow [id number]) on 04/22/2021 6:04:02 PM  Radiology CT HEAD WO CONTRAST (5MM)  Result Date: 04/22/2021 CLINICAL DATA:  Head trauma syncope EXAM: CT HEAD WITHOUT CONTRAST TECHNIQUE: Contiguous axial images were obtained from the base of the skull through the vertex without intravenous contrast. COMPARISON:  CT brain 01/16/2021 FINDINGS: Brain: No acute territorial infarction, hemorrhage or intracranial mass. Mild atrophy. Nonenlarged ventricles. Vascular: No hyperdense vessels. Mild carotid vascular calcification Skull: Normal. Negative for fracture or focal lesion. Sinuses/Orbits: No acute finding. Other: None IMPRESSION: Negative non contrasted CT appearance of the brain for age Electronically Signed   By: Donavan Foil M.D.   On: 04/22/2021 18:03   DG Chest Port 1 View  Result Date: 04/22/2021 CLINICAL DATA:  Shortness of breath EXAM: PORTABLE CHEST 1 VIEW COMPARISON:  02/19/2021 FINDINGS: Heart is normal size. Aortic atherosclerosis. No confluent airspace opacities or effusions. No acute bony abnormality. IMPRESSION: No active cardiopulmonary disease. Electronically Signed   By: Rolm Baptise M.D.   On: 04/22/2021 17:28    Procedures .Critical Care Performed by: Wyvonnia Dusky, MD Authorized by: Wyvonnia Dusky, MD   Critical care provider statement:    Critical care  time (minutes):  40   Critical care time was exclusive of:  Separately billable procedures and treating other patients  Critical care was necessary to treat or prevent imminent or life-threatening deterioration of the following conditions:  Circulatory failure   Critical care was time spent personally by me on the following activities:  Ordering and performing treatments and interventions, ordering and review of laboratory studies, ordering and review of radiographic studies, pulse oximetry, review of old charts, examination of patient and evaluation of patient's response to treatment   Care discussed with: admitting provider     Medications Ordered in ED Medications  midodrine (PROAMATINE) tablet 10 mg (10 mg Oral Given 04/23/21 0740)  0.9 %  sodium chloride infusion (0 mLs Intravenous Stopped 04/22/21 2216)  aspirin EC tablet 81 mg (81 mg Oral Given 04/23/21 0740)  mirtazapine (REMERON) tablet 7.5 mg (7.5 mg Oral Given 04/22/21 2149)  mometasone-formoterol (DULERA) 200-5 MCG/ACT inhaler 2 puff (has no administration in time range)  acetaminophen (TYLENOL) tablet 650 mg (has no administration in time range)    Or  acetaminophen (TYLENOL) suppository 650 mg (has no administration in time range)  ondansetron (ZOFRAN) tablet 4 mg (has no administration in time range)    Or  ondansetron (ZOFRAN) injection 4 mg (has no administration in time range)  lactated ringers infusion ( Intravenous New Bag/Given 04/23/21 0630)  lactated ringers infusion ( Intravenous Not Given 04/22/21 2220)  0.9 %  sodium chloride infusion (has no administration in time range)  gentamicin (GARAMYCIN) 80 mg in sodium chloride 0.9 % 500 mL irrigation (has no administration in time range)  chlorhexidine (HIBICLENS) 4 % liquid 4 application (has no administration in time range)  chlorhexidine (HIBICLENS) 4 % liquid 4 application (has no administration in time range)  sodium chloride flush (NS) 0.9 % injection 3 mL (has no  administration in time range)  sodium chloride flush (NS) 0.9 % injection 3 mL (has no administration in time range)  0.9 %  sodium chloride infusion (has no administration in time range)  vancomycin (VANCOCIN) IVPB 1000 mg/200 mL premix (has no administration in time range)  sodium chloride 0.9 % bolus 1,000 mL (0 mLs Intravenous Stopped 04/22/21 1830)  potassium chloride 10 mEq in 100 mL IVPB (0 mEq Intravenous Stopped 04/22/21 2217)  magnesium sulfate IVPB 2 g 50 mL (0 g Intravenous Stopped 04/22/21 2217)  potassium chloride SA (KLOR-CON) CR tablet 40 mEq (40 mEq Oral Given 04/23/21 0131)    ED Course  I have reviewed the triage vital signs and the nursing notes.  Pertinent labs & imaging results that were available during my care of the patient were reviewed by me and considered in my medical decision making (see chart for details).  Syncope vs near syncope Ddx includes orthostatic hypotension vs arrhythmia (including bradycardia) vs other  Patient arrived in new onset A Fib with RVR, HR 120-130's, subsequently spontaneously converted back to sinus bradycardia native rhythm.  No diltiazem was given.  I reviewed his prior medical records including cardiology notes, outpatient monitoring (cardiac)  I interpreted his ECG, imaging and labs, and reviewed his telemetry.  Notable for Trop 50's initially, no chest pain, likely some demand ischemia from A Fib episode. Multiple ECG's reviewed - A Fib conversion to sinus brady, no acute ischemic findings, I do not see evidence of high grade heart block on ECG tracing  DG chest and CTH unremarkable   K 2.8 - IV K ordered for repletion Patient to be admitted for monitoring, Cardiology or EP consultation.  Clinical Course as of 04/23/21 1013  Tue Apr 22, 2021  1727 Patient spontaneously  converted back into sinus rhythm with a heart rate in the 70s prior to receiving diltiazem.  The medication is being held.  Repeat EKG shows sinus rhythm.  We  will need to complete the work-up and we will continue monitoring him in the ED to ensure that he remains in sinus rhythm. [MT]  3419 HR now in 30's, which appears lower than baseline; he remains asymptomatic with it. [MT]  1949 Paged heartcare cardiology  [MT]  1950 Trop 56, likely demand ischemia from A Fib episode.  IV K ordered [MT]  2021 Paged for admission.  I spoke to cardiology, they will consult on patient tomorrow (possible EP consult, regarding need for pacemaker), but given that the patient is asymptomatic and not in heart block, he is not needing emergent pacing at this time and he can be monitored on tele overnight. [MT]  2033 Admitted to Dr Cyd Silence hospitalist [MT]    Clinical Course User Index [MT] Langston Masker Carola Rhine, MD    Final Clinical Impression(s) / ED Diagnoses Final diagnoses:  Hypokalemia  Atrial fibrillation with RVR ([place])  Bradycardia  Syncope, unspecified syncope type    Rx / DC Orders ED Discharge Orders     None        Wyvonnia Dusky, MD 04/23/21 1013

## 2021-04-22 NOTE — Assessment & Plan Note (Signed)
   Chest pain-free  No dynamic ST segment change on evaluation of EKG  Elevated troponin likely secondary to supply demand mismatch from rapid atrial fibrillation on arrival  Continue to monitor on telemetry  Continuing home regimen of aspirin  Continuing to cycle cardiac enzymes

## 2021-04-22 NOTE — Assessment & Plan Note (Signed)
   Possible sinus node dysfunction this patient with rapid atrial fibrillation followed by substantial bradycardia   Remainder of assessment and plan as above

## 2021-04-22 NOTE — Assessment & Plan Note (Signed)
   Continue home regimen of Remeron

## 2021-04-22 NOTE — Assessment & Plan Note (Signed)
   Continued outpatient follow-up with hematology oncology

## 2021-04-22 NOTE — H&P (Signed)
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  Patient explains that he has had a longstanding history of intermittent lightheadedness due to his known history of orthostatic hypotension.  However, patient reports that in the past week patient has been experiencing increasing episodes of lightheadedness.  Sometimes occurring with standing and other times not.  Symptoms do seem to improve with sitting however.    Earlier in the day on 10/25 patient tried to sit up and became extremely lightheaded until he suddenly lost consciousness and fell forward striking his head on the door in front of him.  Patient estimates that he lost consciousness for approximately 20 seconds.  Episode was witnessed by family with no seizure activity tongue biting self urination or defecation.  Of note patient reports upcoming outpatient appoint with Dr. [name] tomorrow with electrophysiology for evaluation for possible pacemaker placement due to symptomatic bradycardia.  EMS was contacted who promptly came to evaluate patient.  Upon EMS arrival they noted the patient was in A. fib with rapid regular response heart rates ranging from 120 to 160 bpm.  Patient was promptly brought  to [name] emergency department for evaluation.  Upon evaluation in the emergency department, patient was initially found to be in rapid atrial fibrillation but spontaneously converted into sinus bradycardia with heart rates in the low 40s.  AV nodal blocking agents were not administered.  ER provider discussed case with APP Orie Fisherman with cardiology who stated that patient is on the list for cardiology/electrophysiology consultation tomorrow morning.  Work-up did reveal a slight leukocytosis of 11.3. Chest x-ray was unremarkable.  CT head without contrast was unremarkable.  The hospitalist group was then called to assess the patient for admission to the hospital.    Review of Systems:   Review of Systems  Neurological:  Positive for dizziness, loss of consciousness and weakness.  All other systems reviewed and are negative.  Past Medical History:  Diagnosis Date   Asthma    Carcinoid tumor, small intestine, malignant (HCC)    Chronic diarrhea    CKD (chronic kidney disease) stage 3, GFR 30-59 ml/min (HCC)    HTN (hypertension)    Short gut syndrome     Past Surgical History:  Procedure Laterality Date   COLON SURGERY       reports that he quit smoking about 13 months ago. His smoking use included cigarettes. He started smoking about 62 years ago. He has a 18.30 pack-year smoking history. He has never used smokeless tobacco. He reports that he does not currently use alcohol. He reports that he does not use drugs.  Allergies  Allergen Reactions   Penicillins Hives and Shortness Of Breath   Lisinopril Itching    Family History  Problem Relation Age of Onset   Alzheimer's disease Mother  Prior to Admission medications   Medication Sig Start Date End Date Taking? Authorizing Provider  aspirin 81 MG EC tablet Take 81 mg by mouth daily. Swallow whole.    [provider]  chlorpheniramine-HYDROcodone (TUSSIONEX PENNKINETIC ER) 10-8 MG/5ML SUER Take 5 mLs  by mouth every 12 (twelve) hours as needed for cough. Patient not taking: Reported on 04/21/2021 02/25/21   Alan Lima, MD  iron polysaccharides (NIFEREX) 150 MG capsule Take 1 capsule (150 mg total) by mouth 3 (three) times a week. 02/26/21   Brunetta Genera, MD  LANREOTIDE ACETATE [place] Inject into the skin.    [provider]  midodrine (PROAMATINE) 10 MG tablet Take 1 tablet (10 mg total) by mouth 3 (three) times daily. 04/21/21   Alan Pain, MD  mirtazapine (REMERON) 7.5 MG tablet Take by mouth. 02/18/21   [provider]  Multiple Vitamin (MULTIVITAMIN WITH MINERALS) TABS Take 1 tablet by mouth daily.    [provider]  Multiple Vitamins-Minerals (PRESERVISION AREDS 2 PO) Take 1 capsule by mouth daily.    [provider]  Nutritional Supplements (,FEEDING SUPPLEMENT, PROSOURCE PLUS) liquid Take 30 mLs by mouth 3 (three) times daily between meals. Patient not taking: Reported on 04/21/2021 01/22/21   Kerney Elbe, DO  SYMBICORT 160-4.5 MCG/ACT inhaler Inhale 2 puffs into the lungs at bedtime. 10/31/20   [provider]  Vitamin D, Ergocalciferol, (DRISDOL) 1.25 MG (50000 UNIT) CAPS capsule Take 50,000 Units by mouth every 7 (seven) days. Patient not taking: Reported on 04/21/2021 07/08/20   [provider]    Physical Exam: Vitals:   04/22/21 2000 04/22/21 2030 04/22/21 2100 04/22/21 2130  BP: (!) 151/89 (!) 161/78 (!) 173/84 (!) 167/83  Pulse: (!) 38 (!) 40 (!) 33 (!) 56  Resp: (!) 24 (!) 21 (!) 26 16  Temp:      TempSrc:      SpO2: 94% (!) 89% 98% 98%  Weight:      Height:        Constitutional: Awake alert and oriented x3, no associated distress.   Skin: no rashes, no lesions, poor skin turgor noted. Eyes: Pupils are equally reactive to light.  No evidence of scleral icterus or conjunctival pallor.  ENMT: Slightly dry mucous membranes noted.  Posterior pharynx clear of any exudate or lesions.   Neck: normal,  supple, no masses, no thyromegaly.  No evidence of jugular venous distension.   Respiratory: clear to auscultation bilaterally, no wheezing, no crackles. Normal respiratory effort. No accessory muscle use.  Cardiovascular: Regular rate and rhythm, no murmurs / rubs / gallops. No extremity edema. 2+ pedal pulses. No carotid bruits.  Chest:   Nontender without crepitus or deformity.   Back:   Nontender without crepitus or deformity. Abdomen: Abdomen is soft and nontender.  No evidence of intra-abdominal masses.  Positive bowel sounds noted in all quadrants.   Musculoskeletal: No joint deformity upper and lower extremities. Good ROM, no contractures. Normal muscle tone.  Neurologic: CN 2-12 grossly intact. Sensation intact.  Patient moving all 4 extremities spontaneously.  Patient is following all commands.  Patient is responsive to verbal stimuli.   Psychiatric: Patient exhibits normal mood with appropriate affect.  Patient seems to possess insight as to their current situation.     Labs on Admission: I have personally reviewed following labs and imaging studies -   CBC: Recent Labs  Lab 04/22/21 1649  WBC 11.3*  NEUTROABS 9.5*  HGB 11.9*  HCT 33.9*  MCV 89.7  PLT 735   Basic Metabolic Panel: Recent Labs  Lab 04/22/21 1649  NA 141  K 2.8*  CL 102  CO2 28  GLUCOSE 141*  BUN 30*  CREATININE 1.74*  CALCIUM 8.8*  MG 1.8   GFR: Estimated Creatinine Clearance: 31.6 mL/min (A) (by C-G formula based on SCr of 1.74 mg/dL (H)). Liver Function Tests: No results for input(s): AST, ALT, ALKPHOS, BILITOT, PROT, ALBUMIN in the last 168 hours. No results for input(s): LIPASE, AMYLASE in the last 168 hours. No results for input(s): AMMONIA in the last 168 hours. Coagulation Profile: No results for input(s): INR, PROTIME in the last 168 hours. Cardiac Enzymes: No results for input(s): CKTOTAL, CKMB, CKMBINDEX, TROPONINI in the last 168 hours. BNP (last 3 results) No results for input(s):  PROBNP in the last 8760 hours. HbA1C: No results for input(s): HGBA1C in the last 72 hours. CBG: No results for input(s): GLUCAP in the last 168 hours. Lipid Profile: No results for input(s): CHOL, HDL, LDLCALC, TRIG, CHOLHDL, LDLDIRECT in the last 72 hours. Thyroid Function Tests: Recent Labs    04/22/21 1649  TSH 1.591   Anemia Panel: No results for input(s): VITAMINB12, FOLATE, FERRITIN, TIBC, IRON, RETICCTPCT in the last 72 hours. Urine analysis:    Component Value Date/Time   COLORURINE YELLOW 01/16/2021 1330   APPEARANCEUR CLEAR 01/16/2021 1330   LABSPEC 1.024 01/16/2021 1330   PHURINE 5.0 01/16/2021 1330   GLUCOSEU NEGATIVE 01/16/2021 1330   HGBUR NEGATIVE 01/16/2021 1330   BILIRUBINUR NEGATIVE 01/16/2021 1330   KETONESUR NEGATIVE 01/16/2021 1330   PROTEINUR NEGATIVE 01/16/2021 1330   UROBILINOGEN 0.2 08/23/2012 2225   NITRITE NEGATIVE 01/16/2021 1330   LEUKOCYTESUR NEGATIVE 01/16/2021 1330    Radiological Exams on Admission - Personally Reviewed: CT HEAD WO CONTRAST (5MM)  Result Date: 04/22/2021 CLINICAL DATA:  Head trauma syncope EXAM: CT HEAD WITHOUT CONTRAST TECHNIQUE: Contiguous axial images were obtained from the base of the skull through the vertex without intravenous contrast. COMPARISON:  CT brain 01/16/2021 FINDINGS: Brain: No acute territorial infarction, hemorrhage or intracranial mass. Mild atrophy. Nonenlarged ventricles. Vascular: No hyperdense vessels. Mild carotid vascular calcification Skull: Normal. Negative for fracture or focal lesion. Sinuses/Orbits: No acute finding. Other: None IMPRESSION: Negative non contrasted CT appearance of the brain for age Electronically Signed   By: Donavan Foil M.D.   On: 04/22/2021 18:03   DG Chest Port 1 View  Result Date: 04/22/2021 CLINICAL DATA:  Shortness of breath EXAM: PORTABLE CHEST 1 VIEW COMPARISON:  02/19/2021 FINDINGS: Heart is normal size. Aortic atherosclerosis. No confluent airspace opacities or  effusions. No acute bony abnormality. IMPRESSION: No active cardiopulmonary disease. Electronically Signed   By: Rolm Baptise M.D.   On: 04/22/2021 17:28    EKG: Personally reviewed.  Rhythm is sinus bradycardia with heart rate of 44 bpm.  No dynamic ST segment changes appreciated.  Assessment/Plan  * Syncope Complicated multifactorial cause of syncope.   While patient was found to be in rapid atrial fibrillation after syncope earlier today with spontaneous conversion to bradycardia on arrival to the emergency department, patient was also found to have multiple electrolyte abnormalities including hypokalemia and hypomagnesemia in the setting of chronic diarrhea which is likely related some degree of volume depletion.   Possible sinus node dysfunction that may prompt the need for pacemaker but again, overall picture is complicated Hydrating patient gently with intravenous isotonic fluids  Avoiding any AV nodal blocking agents Monitoring patient on telemetry  Cycling cardiac enzymes Echocardiogram recently performed 12/2019 therefore I do not believe a repeat is necessary ER provider already discussed case with Orie Fisherman APP with cardiology and patient will be evaluated by cardiology in the morning.  Atrial fibrillation with rapid ventricular response (HCC) Presentation with rapid atrial fibrillation with heart rates in excess of 140 bpm Spontaneous conversion to bradycardia without intervention Etiology possibly secondary to volume depletion, electrolyte disturbance or possible sinus node dysfunction Avoiding anticoagulation for now as patient has history of bleeding complications in the past Avoiding AV nodal blocking agents for now as patient is suffering from substantial bradycardia with heart rate in the low 40s currently. Monitoring patient on telemetry for recurrence Correcting electrolyte disturbances including hypokalemia and hypomagnesemia Cardiology to evaluate in the  morning Cycling cardiac enzymes TSH unremarkable Echocardiogram already performed in July  Sinus node dysfunction (HCC) Possible sinus node dysfunction this patient with rapid atrial fibrillation followed by substantial bradycardia  Remainder of assessment and plan as above  Elevated troponin level not due myocardial infarction Chest [name]-free No dynamic ST segment change on evaluation of EKG Elevated troponin likely secondary to supply demand mismatch from rapid atrial fibrillation on arrival Continue to monitor on telemetry Continuing home regimen of aspirin Continuing to cycle cardiac enzymes  Chronic kidney disease, stage 3a (HCC) Strict intake and output monitoring Baseline creatinine somewhat unclear however I believe that it is a near baseline. Minimizing nephrotoxic agents as much as possible Serial chemistries to monitor renal function and electrolytes   Hypokalemia due to excessive gastrointestinal loss of potassium Substantial hypokalemia of 2.8, possibly secondary to chronic diarrhea Replacing with both oral and intravenous potassium chloride Concurrently replacing magnesium sulfate Uncertain as to how much patient's electrolyte disturbance contributed to rapid atrial fibrillation. Monitoring electrolytes closely with serial chemistries  Chronic diarrhea Longstanding chronic diarrhea secondary to short gut syndrome This is likely precipitating ongoing relative volume depletion and electrolyte disturbances Patient receives outpatient lanreotide injections Will provide with as needed antimotility agents if necessary  Orthostatic hypotension Several month history of orthostatic hypotension currently on midodrine which will be continued at this time Presence of diagnosis of orthostatic hypotension with certainly complicates things assessment for syncope as well.  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       Vernelle Emerald MD [facility] Pager [phone]  If 7PM-7AM, please contact night-coverage [email or web address] Use universal [place] password for that web site. If you do not have the password, please call the hospital operator.  04/22/2021, 10:14 PM

## 2021-04-22 NOTE — Assessment & Plan Note (Signed)
   Complicated multifactorial cause of syncope.    While patient was found to be in rapid atrial fibrillation after syncope earlier today with spontaneous conversion to bradycardia on arrival to the emergency department, patient was also found to have multiple electrolyte abnormalities including hypokalemia and hypomagnesemia in the setting of chronic diarrhea which is likely related some degree of volume depletion.    Possible sinus node dysfunction that may prompt the need for pacemaker but again, overall picture is complicated  Hydrating patient gently with intravenous isotonic fluids   Avoiding any AV nodal blocking agents  Monitoring patient on telemetry  Cycling cardiac enzymes  Echocardiogram recently performed 12/2019 therefore I do not believe a repeat is necessary  ER provider already discussed case with Orie Fisherman APP with cardiology and patient will be evaluated by cardiology in the morning.

## 2021-04-22 NOTE — ED Notes (Signed)
RN in room speaking to pt, pt drank a sip of water and converted from afib into a NSR. EKG captured, MD Trifan aware, verbal to not give cardizem.

## 2021-04-23 ENCOUNTER — Inpatient Hospital Stay (HOSPITAL_COMMUNITY)
Admission: EM | Disposition: A | Discharge status: Home / Self Care | Payer: Self-pay | Source: Home / Self Care | Attending: Internal Medicine

## 2021-04-23 ENCOUNTER — Institutional Professional Consult (permissible substitution): Payer: Medicare Other | Admitting: Internal Medicine

## 2021-04-23 DIAGNOSIS — R001 Bradycardia, unspecified: Secondary | ICD-10-CM | POA: Diagnosis not present

## 2021-04-23 DIAGNOSIS — Z888 Allergy status to other drugs, medicaments and biological substances status: Secondary | ICD-10-CM | POA: Diagnosis not present

## 2021-04-23 DIAGNOSIS — I441 Atrioventricular block, second degree: Secondary | ICD-10-CM | POA: Diagnosis present

## 2021-04-23 DIAGNOSIS — I480 Paroxysmal atrial fibrillation: Secondary | ICD-10-CM

## 2021-04-23 DIAGNOSIS — K912 Postsurgical malabsorption, not elsewhere classified: Secondary | ICD-10-CM | POA: Diagnosis present

## 2021-04-23 DIAGNOSIS — I495 Sick sinus syndrome: Secondary | ICD-10-CM | POA: Diagnosis present

## 2021-04-23 DIAGNOSIS — J45909 Unspecified asthma, uncomplicated: Secondary | ICD-10-CM | POA: Diagnosis present

## 2021-04-23 DIAGNOSIS — N1831 Chronic kidney disease, stage 3a: Secondary | ICD-10-CM | POA: Diagnosis present

## 2021-04-23 DIAGNOSIS — K529 Noninfective gastroenteritis and colitis, unspecified: Secondary | ICD-10-CM | POA: Diagnosis not present

## 2021-04-23 DIAGNOSIS — Z87891 Personal history of nicotine dependence: Secondary | ICD-10-CM | POA: Diagnosis not present

## 2021-04-23 DIAGNOSIS — Z79899 Other long term (current) drug therapy: Secondary | ICD-10-CM | POA: Diagnosis not present

## 2021-04-23 DIAGNOSIS — Z7982 Long term (current) use of aspirin: Secondary | ICD-10-CM | POA: Diagnosis not present

## 2021-04-23 DIAGNOSIS — I472 Ventricular tachycardia, unspecified: Secondary | ICD-10-CM | POA: Diagnosis not present

## 2021-04-23 DIAGNOSIS — I129 Hypertensive chronic kidney disease with stage 1 through stage 4 chronic kidney disease, or unspecified chronic kidney disease: Secondary | ICD-10-CM | POA: Diagnosis present

## 2021-04-23 DIAGNOSIS — Z880 Allergy status to penicillin: Secondary | ICD-10-CM | POA: Diagnosis not present

## 2021-04-23 DIAGNOSIS — Z820 Family history of epilepsy and other diseases of the nervous system: Secondary | ICD-10-CM | POA: Diagnosis not present

## 2021-04-23 DIAGNOSIS — Z85060 Personal history of malignant carcinoid tumor of small intestine: Secondary | ICD-10-CM | POA: Diagnosis not present

## 2021-04-23 DIAGNOSIS — Z7951 Long term (current) use of inhaled steroids: Secondary | ICD-10-CM | POA: Diagnosis not present

## 2021-04-23 DIAGNOSIS — E876 Hypokalemia: Secondary | ICD-10-CM | POA: Diagnosis present

## 2021-04-23 DIAGNOSIS — R55 Syncope and collapse: Secondary | ICD-10-CM | POA: Diagnosis present

## 2021-04-23 DIAGNOSIS — F329 Major depressive disorder, single episode, unspecified: Secondary | ICD-10-CM | POA: Diagnosis present

## 2021-04-23 DIAGNOSIS — Z8616 Personal history of COVID-19: Secondary | ICD-10-CM | POA: Diagnosis not present

## 2021-04-23 DIAGNOSIS — I951 Orthostatic hypotension: Secondary | ICD-10-CM | POA: Diagnosis present

## 2021-04-23 HISTORY — PX: PACEMAKER IMPLANT: EP1218

## 2021-04-23 LAB — SURGICAL PCR SCREEN
MRSA, PCR: NEGATIVE
Staphylococcus aureus: NEGATIVE

## 2021-04-23 LAB — BASIC METABOLIC PANEL
Anion gap: 6 (ref 5–15)
BUN: 23 mg/dL (ref 8–23)
CO2: 31 mmol/L (ref 22–32)
Calcium: 9 mg/dL (ref 8.9–10.3)
Chloride: 105 mmol/L (ref 98–111)
Creatinine, Ser: 1.41 mg/dL — ABNORMAL HIGH (ref 0.61–1.24)
GFR, Estimated: 51 mL/min — ABNORMAL LOW (ref >60)
Glucose, Bld: 123 mg/dL — ABNORMAL HIGH (ref 70–99)
Potassium: 3.5 mmol/L (ref 3.5–5.1)
Sodium: 142 mmol/L (ref 135–145)

## 2021-04-23 LAB — TROPONIN I (HIGH SENSITIVITY): Troponin I (High Sensitivity): 124 ng/L (ref <18)

## 2021-04-23 SURGERY — PACEMAKER IMPLANT

## 2021-04-23 MED ORDER — LIDOCAINE HCL 1 % IJ SOLN
INTRAMUSCULAR | Status: AC
  Filled 2021-04-23: qty 20

## 2021-04-23 MED ORDER — VANCOMYCIN HCL IN DEXTROSE 1-5 GM/200ML-% IV SOLN
1000.0000 mg | Freq: Two times a day (BID) | INTRAVENOUS | Status: AC
Start: 2021-04-24 — End: 2021-04-24
  Administered 2021-04-24: 1000 mg via INTRAVENOUS
  Filled 2021-04-23: qty 200

## 2021-04-23 MED ORDER — CHLORHEXIDINE GLUCONATE 4 % EX LIQD: 60.0000 mL | Freq: Once | CUTANEOUS | Status: DC

## 2021-04-23 MED ORDER — LIDOCAINE HCL 1 % IJ SOLN
INTRAMUSCULAR | Status: AC
  Filled 2021-04-23: qty 60

## 2021-04-23 MED ORDER — HYDRALAZINE HCL 20 MG/ML IJ SOLN
INTRAMUSCULAR | Status: DC | PRN
  Administered 2021-04-23: 10 mg via INTRAVENOUS

## 2021-04-23 MED ORDER — VANCOMYCIN HCL IN DEXTROSE 1-5 GM/200ML-% IV SOLN
INTRAVENOUS | Status: AC
  Filled 2021-04-23: qty 200

## 2021-04-23 MED ORDER — MIDODRINE HCL 5 MG PO TABS
10.0000 mg | ORAL_TABLET | Freq: Three times a day (TID) | ORAL | Status: DC
  Administered 2021-04-24: 10 mg via ORAL
  Filled 2021-04-23: qty 2

## 2021-04-23 MED ORDER — SODIUM CHLORIDE 0.9% FLUSH: 3.0000 mL | INTRAVENOUS | Status: DC | PRN

## 2021-04-23 MED ORDER — SODIUM CHLORIDE 0.9% FLUSH
3.0000 mL | Freq: Two times a day (BID) | INTRAVENOUS | Status: DC
  Administered 2021-04-23: 3 mL via INTRAVENOUS

## 2021-04-23 MED ORDER — INFLUENZA VAC A&B SA ADJ QUAD 0.5 ML IM PRSY
0.5000 mL | PREFILLED_SYRINGE | INTRAMUSCULAR | Status: DC
  Filled 2021-04-23: qty 0.5

## 2021-04-23 MED ORDER — SODIUM CHLORIDE 0.9 % IV SOLN: INTRAVENOUS | Status: DC

## 2021-04-23 MED ORDER — HEPARIN (PORCINE) IN NACL 1000-0.9 UT/500ML-% IV SOLN
INTRAVENOUS | Status: AC
  Filled 2021-04-23: qty 500

## 2021-04-23 MED ORDER — SODIUM CHLORIDE 0.9 % IV SOLN
INTRAVENOUS | Status: AC
  Filled 2021-04-23: qty 2

## 2021-04-23 MED ORDER — LIDOCAINE HCL (PF) 1 % IJ SOLN
INTRAMUSCULAR | Status: DC | PRN
  Administered 2021-04-23: 45 mL

## 2021-04-23 MED ORDER — VANCOMYCIN HCL IN DEXTROSE 1-5 GM/200ML-% IV SOLN
1000.0000 mg | INTRAVENOUS | Status: AC
  Administered 2021-04-23: 1000 mg via INTRAVENOUS
  Filled 2021-04-23: qty 200

## 2021-04-23 MED ORDER — HYDRALAZINE HCL 20 MG/ML IJ SOLN
INTRAMUSCULAR | Status: AC
  Filled 2021-04-23: qty 1

## 2021-04-23 MED ORDER — HEPARIN (PORCINE) IN NACL 1000-0.9 UT/500ML-% IV SOLN
INTRAVENOUS | Status: DC | PRN
  Administered 2021-04-23: 500 mL

## 2021-04-23 MED ORDER — POTASSIUM CHLORIDE CRYS ER 20 MEQ PO TBCR
40.0000 meq | EXTENDED_RELEASE_TABLET | Freq: Once | ORAL | Status: AC
  Administered 2021-04-23: 40 meq via ORAL
  Filled 2021-04-23: qty 2

## 2021-04-23 MED ORDER — SODIUM CHLORIDE 0.9 % IV SOLN
250.0000 mL | INTRAVENOUS | Status: DC
Start: 2021-04-23 — End: 2021-04-23

## 2021-04-23 MED ORDER — GENTAMICIN SULFATE 40 MG/ML IJ SOLN
80.0000 mg | INTRAMUSCULAR | Status: AC
  Administered 2021-04-23: 80 mg
  Filled 2021-04-23: qty 2

## 2021-04-23 SURGICAL SUPPLY — 7 items
CABLE SURGICAL S-101-97-12 (CABLE) ×3 IMPLANT
LEAD SOLIA S PRO MRI 53 (Lead) ×1 IMPLANT
LEAD SOLIA S PRO MRI 60 (Lead) ×1 IMPLANT
PACEMAKER EDORA 8DR-T MRI (Pacemaker) ×1 IMPLANT
PAD PRO RADIOLUCENT 2001M-C (PAD) ×3 IMPLANT
SHEATH 7FR PRELUDE SNAP 13 (SHEATH) ×2 IMPLANT
TRAY PACEMAKER INSERTION (PACKS) ×3 IMPLANT

## 2021-04-23 NOTE — Discharge Instructions (Addendum)
    Supplemental Discharge Instructions for  Pacemaker/Defibrillator Patients   Activity No heavy lifting or vigorous activity with your left/right arm for 6 to 8 weeks.  Do not raise your left/right arm above your head for one week.  Gradually raise your affected arm as drawn below.             04/28/21                    04/29/21                     04/30/21                  05/01/21 __  NO DRIVING until cleared to at your wound check visit.  WOUND CARE Keep the wound area clean and dry.  Do not get this area wet , no showers until cleared to at your wound check visit The tape/steri-strips on your wound will fall off; do not pull them off.  No bandage is needed on the site.  DO  NOT apply any creams, oils, or ointments to the wound area. If you notice any drainage or discharge from the wound, any swelling or bruising at the site, or you develop a fever > 101? F after you are discharged home, call the office at once.  Special Instructions You are still able to use cellular telephones; use the ear opposite the side where you have your pacemaker/defibrillator.  Avoid carrying your cellular phone near your device. When traveling through airports, show security personnel your identification card to avoid being screened in the metal detectors.  Ask the security personnel to use the hand wand. Avoid arc welding equipment, MRI testing (magnetic resonance imaging), TENS units (transcutaneous nerve stimulators).  Call the office for questions about other devices. Avoid electrical appliances that are in poor condition or are not properly grounded. Microwave ovens are safe to be near or to operate.

## 2021-04-23 NOTE — Progress Notes (Signed)
Patient returned from cath lab at 1815hrs.  Left arm in sling, dressing to left chest CDI.  Reviewed post pacemaker instructions, patient verbalized understanding.

## 2021-04-23 NOTE — Interval H&P Note (Signed)
History and Physical Interval Note:  04/23/2021 2:47 PM  Alan [name] Sharron  has presented today for surgery, with the diagnosis of tach.  The various methods of treatment have been discussed with the patient and family. After consideration of risks, benefits and other options for treatment, the patient has consented to  Procedure(s): PACEMAKER IMPLANT (N/A) as a surgical intervention.  The patient's history has been reviewed, patient examined, no change in status, stable for surgery.  I have reviewed the patient's chart and labs.  Questions were answered to the patient's satisfaction.     Wilborn Membreno [employer]

## 2021-04-23 NOTE — Progress Notes (Signed)
Patient taken to cath lab via bed for pacemaker placement

## 2021-04-23 NOTE — Plan of Care (Signed)
  Problem: Education: Goal: Knowledge of General Education information will improve Description: Including pain rating scale, medication(s)/side effects and non-pharmacologic comfort measures Outcome: Progressing   Problem: Health Behavior/Discharge Planning: Goal: Ability to manage health-related needs will improve Outcome: Progressing   Problem: Clinical Measurements: Goal: Ability to maintain clinical measurements within normal limits will improve Outcome: Progressing   Problem: Clinical Measurements: Goal: Will remain free from infection Outcome: Progressing   Problem: Clinical Measurements: Goal: Diagnostic test results will improve Outcome: Progressing   Problem: Clinical Measurements: Goal: Cardiovascular complication will be avoided Outcome: Progressing   Problem: Coping: Goal: Level of anxiety will decrease Outcome: Progressing   Problem: Safety: Goal: Ability to remain free from injury will improve Outcome: Progressing   Problem: Skin Integrity: Goal: Risk for impaired skin integrity will decrease Outcome: Progressing   Problem: Cardiac: Goal: Ability to achieve and maintain adequate cardiopulmonary perfusion will improve Outcome: Progressing

## 2021-04-23 NOTE — Consult Note (Addendum)
Cardiology Consultation:   Patient ID: Alan [name] MRN: [id number]; DOB: [birth year]  Admit date: 04/22/2021 Date of Consult: 04/23/2021  PCP:  [name], MD   [facility] Cardiologist:  Dr. Marlou Porch   Patient Profile:   Alan [name] is a 80 y.o. male with a hx of malignant carcinoid tumor intstine, chronic diarrhea, CKD (IIIb), HTN with orthostatic hypotension and syncope, short gut syndrome, PE (not on a/c 2/2 GIB) who is being seen 04/23/2021 for the evaluation of possible tachy-brady at the request of Dr. Maryland Pink.  History of Present Illness:   [name] was fairly recently in July with presyncope. He was orthostatic and started on midodrine. He had a small subsegmental PE. Initially anticoagulated but developed significant mucosal bleeding and anemia so anticoagulation was stopped. Was bradycardic but this was at baseline and felt not to contribute.  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  He had follow up with Dr. Marlou Porch yesterday for this, the patient reported no improvement in his near syncope and orthostatic symptoms with midodrine, in fact perhaps worse.  He was planned to see Dr. [name] for EP evaluation orthostatic vital lying 161/84 HR 47, sitting 147/83 HR 53, standing 115/70 HR 60, standing 3 min 118/68 HR 73 Recommended compression wear, and tamsulosin and flomax discontinued   He was admitted via the ER yesterday for recurrent syncope. EMS was called by notes (no EMS records for review) he was in AFib fast rates, upon arrival to the ER he was in Afib 120's had spontaneous conversion to SB 40's-50's. BP has been stable  The patient states he had just stood up was walking to the door, felt his symptoms starting but could not sit down fast enough No palpitations or CP He says this is always how the events happen, usually though he is able to recognize them  and sit to prevent syncope. He does not think the midodrine is helping His symptoms started in Jan of weak spells, near syncope and are more frequent and worse especially of late  He dates his bradycardia back years, 5 or 6 once when at [facility] several years ago for out patient oncology visit his HR 30's  At baseline with poor oral intake, chronic diarrhea despite meds, though says this has been minimized slightly with the addition of Lanreotide  LABS K+ 2.8 Mag 1.8 BUN/Creat 30/1.74 (baseline HS Trop 56 > 122 > 124 WBC 11.3 H/H 11.9/33.9 Plts 171 TSH 1.591    Past Medical History:  Diagnosis Date   Asthma    Carcinoid tumor, small intestine, malignant (HCC)    Chronic diarrhea    CKD (chronic kidney disease) stage 3, GFR 30-59 ml/min (HCC)    HTN (hypertension)    Short gut syndrome     Past Surgical History:  Procedure Laterality Date   COLON SURGERY       Home Medications:  Prior to Admission medications   Medication Sig Start Date End Date Taking? Authorizing Provider  aspirin 81 MG EC tablet Take 81 mg by mouth daily. Swallow whole.   Yes [provider]  Ensure Plus (ENSURE PLUS) LIQD Take 237 mLs by mouth 2 (two) times daily between meals.   Yes [provider]  iron polysaccharides (NIFEREX) 150 MG capsule Take 1 capsule (150 mg total) by mouth 3 (three) times a week. Patient taking differently: Take 150 mg by mouth daily. 02/26/21  Yes Brunetta Genera, MD  LANREOTIDE ACETATE [place] Inject 1 Dose into the skin every 30 (thirty) days.   Yes [provider]  midodrine (PROAMATINE) 10 MG tablet Take 1 tablet (10 mg total) by mouth 3 (three) times daily. 04/21/21  Yes Jerline Pain, MD  mirtazapine (REMERON) 7.5 MG tablet Take 7.5 mg by mouth daily. 02/18/21  Yes [provider]  Multiple Vitamin (MULTIVITAMIN WITH MINERALS) TABS Take 1 tablet by mouth daily.   Yes [provider]  Multiple Vitamins-Minerals (PRESERVISION  AREDS 2 PO) Take 1 capsule by mouth daily.   Yes [provider]  SYMBICORT 160-4.5 MCG/ACT inhaler Inhale 2 puffs into the lungs daily as needed (wheezing). 10/31/20  Yes [provider]  chlorpheniramine-HYDROcodone (TUSSIONEX PENNKINETIC ER) 10-8 MG/5ML SUER Take 5 mLs by mouth every 12 (twelve) hours as needed for cough. Patient not taking: No sig reported 02/25/21   [name], MD  Nutritional Supplements (,FEEDING SUPPLEMENT, PROSOURCE PLUS) liquid Take 30 mLs by mouth 3 (three) times daily between meals. Patient not taking: No sig reported 01/22/21   [name], DO  Vitamin D, Ergocalciferol, (DRISDOL) 1.25 MG (50000 UNIT) CAPS capsule Take 50,000 Units by mouth every 7 (seven) days. Patient not taking: No sig reported 07/08/20   [provider]    Inpatient Medications: Scheduled Meds:  aspirin EC  81 mg Oral Daily   midodrine  10 mg Oral TID WC   mirtazapine  7.5 mg Oral QHS   mometasone-formoterol  2 puff Inhalation BID   Continuous Infusions:  sodium chloride Stopped (04/22/21 2216)   lactated ringers 125 mL/hr at 04/23/21 0630   PRN Meds: sodium chloride, acetaminophen **OR** acetaminophen, ondansetron **OR** ondansetron (ZOFRAN) IV  Allergies:    Allergies  Allergen Reactions   Penicillins Hives and Shortness Of Breath   Lisinopril Itching    Social History:   Social History   Socioeconomic History   Marital status: Married    Spouse name: Not on file   Number of children: Not on file   Years of education: Not on file   Highest education level: Not on file  Occupational History   Not on file  Tobacco Use   Smoking status: Former    Packs/day: 0.30    Years: 61.00    Pack years: 18.30    Types: Cigarettes    Start date: 93    Quit date: 03/03/2020    Years since quitting: 1.1   Smokeless tobacco: Never  Vaping Use   Vaping Use: Never used  Substance and Sexual Activity   Alcohol use: Not Currently   Drug use: No    Sexual activity: Not Currently  Other Topics Concern   Not on file  Social History Narrative   Not on file   Social Determinants of Health   Financial Resource Strain: Not on file  Food Insecurity: Not on file  Transportation Needs: Not on file  Physical Activity: Not on file  Stress: Not on file  Social Connections: Not on file  Intimate Partner Violence: Not on file    Family History:   Family History  Problem Relation Age of Onset   Alzheimer's disease Mother      ROS:  Please see the history of present illness.  All other ROS reviewed and negative.     Physical Exam/Data:   Vitals:   04/23/21 0530 04/23/21 0600 04/23/21 0630 04/23/21 0700  BP: (!) 146/83 (!) 152/73 (!) 156/67 Marland Kitchen)  165/84  Pulse: 63 (!) 48 (!) 48 (!) 46  Resp: (!) 24 (!) 21 (!) 23 16  Temp:      TempSrc:      SpO2: 95% 97% 94% 95%  Weight:      Height:        Intake/Output Summary (Last 24 hours) at 04/23/2021 0831 Last data filed at 04/22/2021 1550 Gross per 24 hour  Intake 500 ml  Output --  Net 500 ml   Last 3 Weights 04/22/2021 04/21/2021 02/25/2021  Weight (lbs) 143 lb 4.8 oz 143 lb 12.8 oz 148 lb 6.4 oz  Weight (kg) 65 kg 65.227 kg 67.314 kg     Body mass index is 18.91 kg/m.  General:  Well nourished, well developed, in no acute distress, appears chronically ill HEENT: normal Neck: no JVD Vascular: No carotid bruits; Distal pulses 2+ bilaterally Cardiac:  RRR; no murmurs, gallops or rubs Lungs:  CTA b/l, no wheezing, rhonchi or rales  Abd: soft, nontender, no hepatomegaly  Ext: no edema Musculoskeletal:  No deformities, advanced atrophy Skin: warm and dry  Neuro:  no focal abnormalities noted Psych:  Normal affect   EKG:  The EKG was personally reviewed and demonstrates:    AFib 121bpm, diffuse ST/T changes SR 70bpm, no ischemic looking changes SB 44bpm, unchanged otherwise  Telemetry:  Telemetry was personally reviewed and demonstrates:   Afib 110's-120's SR/SB  40's-60;s, no post conversion pause noted  Relevant CV Studies:  Monitor 03/28/2021: Sinus rhythm with average heart rate of 53 bpm Rare PVCs, 2% of of overall beats, rare PACs less than 1%. Minimum heart rate 33 bpm sinus rhythm at 8:11 AM on day #13-no symptoms associated with this. 7 beat run of slow ventricular tachycardia at 105 bpm at 4:40 AM, no symptoms reported Slow idioventricular rhythm heart rate in the 30s lasting 6 to 7 seconds in duration-asymptomatic, recorded during sleep 2-1 AV block 36 bpm 8:15 AM day #15 noted. Asymptomatic. Critical value reported.   Consider further evaluation by electrophysiology given underlying conduction disease.   LE Venous DVT 01/19/2021: Summary:  BILATERAL:  - No evidence of deep vein thrombosis seen in the lower extremities,  bilaterally.  - No evidence of superficial venous thrombosis in the lower extremities,  bilaterally.  -  RIGHT:  - No cystic structure found in the popliteal fossa.     LEFT:  - A cystic structure is found in the popliteal fossa.   Echo 01/17/2021:  1. Left ventricular ejection fraction, by estimation, is 55 to 60%. The  left ventricle has normal function. The left ventricle has no regional  wall motion abnormalities. There is mild left ventricular hypertrophy of  the basal-septal segment. Left  ventricular diastolic parameters were normal.   2. Right ventricular systolic function is normal. The right ventricular  size is normal.   3. The mitral valve is normal in structure. No evidence of mitral valve  regurgitation. No evidence of mitral stenosis.   4. The aortic valve is tricuspid. Aortic valve regurgitation is not  visualized. No aortic stenosis is present.   5. The inferior vena cava is normal in size with greater than 50%  respiratory variability, suggesting right atrial pressure of 3 mmHg.    CTA Chest 01/16/2021: COMPARISON:  04/17/2010   FINDINGS: Cardiovascular: Atherosclerotic calcifications of  the aorta are noted. No significant aortic opacification is identified to suggest dissection. No aneurysmal dilatation is noted. No cardiac enlargement is seen. Scattered coronary calcifications are noted. The  pulmonary artery shows a normal branching pattern bilaterally. Tiny right middle lobe pulmonary emboli are seen. No other significant emboli are noted. No right heart strain is seen.   Mediastinum/Nodes: Thoracic inlet is within normal limits. No sizable hilar or mediastinal adenopathy is noted. The esophagus demonstrates some retained fluid and food stuffs. This may be related to reflux.   Lungs/Pleura: Lungs are well aerated bilaterally. Minimal dependent atelectatic changes are seen. No focal infiltrate or effusion is noted. No evidence of metastatic disease is seen.   Upper Abdomen: Visualized upper abdomen shows renal cystic change on the left as well as cysts within the left lobe of the liver. No other focal abnormality in the upper abdomen is seen.   Musculoskeletal: Degenerative changes of the thoracic spine are   Review of the MIP images confirms the above findings.   IMPRESSION: Small pulmonary emboli involving the right middle lobe as described.   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   Aortic Atherosclerosis (ICD10-I70.0).  Laboratory Data:  High Sensitivity Troponin:   Recent Labs  Lab 04/22/21 1649 04/22/21 1953 04/23/21 0645  TROPONINIHS 56* 122* 124*     Chemistry Recent Labs  Lab 04/22/21 1649  NA 141  K 2.8*  CL 102  CO2 28  GLUCOSE 141*  BUN 30*  CREATININE 1.74*  CALCIUM 8.8*  MG 1.8  GFRNONAA 39*  ANIONGAP 11    No results for input(s): PROT, ALBUMIN, AST, ALT, ALKPHOS, BILITOT in the last 168 hours. Lipids No results for input(s): CHOL, TRIG, HDL, LABVLDL, LDLCALC, CHOLHDL in the last 168 hours.  Hematology Recent Labs  Lab 04/22/21 1649  WBC 11.3*  RBC 3.78*  HGB 11.9*  HCT 33.9*  MCV 89.7  MCH 31.5  MCHC 35.1   RDW 12.7  PLT 171   Thyroid  Recent Labs  Lab 04/22/21 1649  TSH 1.591    BNPNo results for input(s): BNP, PROBNP in the last 168 hours.  DDimer No results for input(s): DDIMER in the last 168 hours.   Radiology/Studies:  CT HEAD WO CONTRAST (5MM) Result Date: 04/22/2021 CLINICAL DATA:  Head trauma syncope EXAM: CT HEAD WITHOUT CONTRAST TECHNIQUE: Contiguous axial images were obtained from the base of the skull through the vertex without intravenous contrast. COMPARISON:  CT brain 01/16/2021 FINDINGS: Brain: No acute territorial infarction, hemorrhage or intracranial mass. Mild atrophy. Nonenlarged ventricles. Vascular: No hyperdense vessels. Mild carotid vascular calcification Skull: Normal. Negative for fracture or focal lesion. Sinuses/Orbits: No acute finding. Other: None IMPRESSION: Negative non contrasted CT appearance of the brain for age Electronically Signed   By: Donavan Foil M.D.   On: 04/22/2021 18:03   DG Chest Port 1 View Result Date: 04/22/2021 CLINICAL DATA:  Shortness of breath EXAM: PORTABLE CHEST 1 VIEW COMPARISON:  02/19/2021 FINDINGS: Heart is normal size. Aortic atherosclerosis. No confluent airspace opacities or effusions. No acute bony abnormality. IMPRESSION: No active cardiopulmonary disease. Electronically Signed   By: Rolm Baptise M.D.   On: 04/22/2021 17:28     Assessment and Plan:   Recurrent near syncope and syncope Orthostatic hypotension/dizziness, known for him New paroxysmal Afib  Symptoms and syncope are multifactorial Dr. Curt Bears has seen/examined the patient, reviewed monitor tracings as well without clear AVblock, + SB  Lengthy discussion with the patient Symptoms and syncope are multifactorial Baseline, probably asymptomatic, longstanding SB, though with new fast AFib tachy-brady, PPM implant is reasonable Discussed pacing Amadi Yoshino not cure all of his symptoms Pt understands and is agreeable to  proceed  Historically intolerant of a/c for  small PE with GIB Follow AF burden via device  4. Hypokalemia He has gotten 2 runs of 5meq and 2 PO doses of 61meq Caelyn Route follow up level  5. Abn HS Trop No anginal symptoms or EKG changes Likely 2/2 RVR   Further with IM team   Risk Assessment/Risk Scores:  {  For questions or updates, please contact [place] Please consult [email or web address] for contact info under    Signed, Baldwin Jamaica, PA-C  04/23/2021 8:31 AM  I have seen and examined this patient with Tommye Standard.  Agree with above, note added to reflect my findings.  On exam, RRR, no murmurs.  Patient presented to the hospital after an episode of syncope.  He currently feels well.  He has a history of severe orthostasis and is on midodrine.  His syncope did sound orthostatic in nature.  When he was in the ambulance as well as in the emergency room, he has had short episodes of atrial fibrillation with rapid rates.  He has resting bradycardia.  I think that he would benefit from pacemaker implant due to his tachybradycardia syndrome and resting bradycardia.  Pacemaker implant may help with his orthostatic hypotension.  Ignace Mandigo Bisig has presented today for surgery, with the diagnosis of tachy/brady, orthostasis.  The various methods of treatment have been discussed with the patient and family. After consideration of risks, benefits and other options for treatment, the patient has consented to  Procedure(s): Pacemaker implant as a surgical intervention .  Risks include but not limited to bleeding, infection, pneumothorax, perforation, tamponade, vascular damage, renal failure, MI, stroke, death, and lead dislodgement . The patient's history has been reviewed, patient examined, no change in status, stable for surgery.  I have reviewed the patient's chart and labs.  Questions were answered to the patient's satisfaction.    Mikyah Alamo Curt Bears, MD 04/23/2021 11:17 AM   Lainey Nelson M. Jaquann Guarisco MD 04/23/2021 11:16 AM

## 2021-04-23 NOTE — H&P (View-Only) (Signed)
Cardiology Consultation:   Patient ID: Alan [name] MRN: [id number]; DOB: 27-May-1941  Admit date: 04/22/2021 Date of Consult: 04/23/2021  PCP:  [name], MD   [facility] Prov[facility] Cardiologist:  Dr. Marlou Porch   Patient Profile:   Alan [name] is a 80 y.o. male with a hx of malignant carcinoid tumor intstine, chronic diarrhea, CKD (IIIb), HTN with orthostatic hypotension and syncope, short gut syndrome, PE (not on a/c 2/2 GIB) who is being seen 04/23/2021 for the evaluation of possible tachy-brady at the request of Dr. [place] Pink.  History of Present Illness:   [name] was fairly recently in July with presyncope. He was orthostatic and started on midodrine. He had a small subsegmental PE. Initially anticoagulated but developed significant mucosal bleeding and anemia so anticoagulation was stopped. Was bradycardic but this was at baseline and felt not to contribute.  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  He had follow up with Dr. Marlou Porch yesterday for this, the patient reported no improvement in his near syncope and orthostatic symptoms with midodrine, in fact perhaps worse.  He was planned to see Dr. [name] for EP evaluation orthostatic vital lying 161/84 HR 47, sitting 147/83 HR 53, standing 115/70 HR 60, standing 3 min 118/68 HR 73 Recommended compression wear, and tamsulosin and flomax discontinued   He was admitted via the ER yesterday for recurrent syncope. EMS was called by notes (no EMS records for review) he was in AFib fast rates, upon arrival to the ER he was in Afib 120's had spontaneous conversion to SB 40's-50's. BP has been stable  The patient states he had just stood up was walking to the door, felt his symptoms starting but could not sit down fast enough No palpitations or CP He says this is always how the events happen, usually though he is able to recognize them  and sit to prevent syncope. He does not think the midodrine is helping His symptoms started in Jan of weak spells, near syncope and are more frequent and worse especially of late  He dates his bradycardia back years, 5 or 6 once when at [facility] several years ago for out patient oncology visit his HR 30's  At baseline with poor oral intake, chronic diarrhea despite meds, though says this has been minimized slightly with the addition of Lanreotide  LABS K+ 2.8 Mag 1.8 BUN/Creat 30/1.74 (baseline HS Trop 56 > 122 > 124 WBC 11.3 H/H 11.9/33.9 Plts 171 TSH 1.591    Past Medical History:  Diagnosis Date   Asthma    Carcinoid tumor, small intestine, malignant (HCC)    Chronic diarrhea    CKD (chronic kidney disease) stage 3, GFR 30-59 ml/min (HCC)    HTN (hypertension)    Short gut syndrome     Past Surgical History:  Procedure Laterality Date   COLON SURGERY       Home Medications:  Prior to Admission medications   Medication Sig Start Date End Date Taking? Authorizing Provider  aspirin 81 MG EC tablet Take 81 mg by mouth daily. Swallow whole.   Yes [provider]  Ensure Plus (ENSURE PLUS) LIQD Take 237 mLs by mouth 2 (two) times daily between meals.   Yes [provider]  iron polysaccharides (NIFEREX) 150 MG capsule Take 1 capsule (150 mg total) by mouth 3 (three) times a week. Patient taking differently: Take 150 mg by mouth daily. 02/26/21  Yes Brunetta Genera, MD  LANREOTIDE ACETATE [place] Inject 1 Dose into the skin every 30 (thirty) days.   Yes [provider]  midodrine (PROAMATINE) 10 MG tablet Take 1 tablet (10 mg total) by mouth 3 (three) times daily. 04/21/21  Yes Jerline Pain, MD  mirtazapine (REMERON) 7.5 MG tablet Take 7.5 mg by mouth daily. 02/18/21  Yes [provider]  Multiple Vitamin (MULTIVITAMIN WITH MINERALS) TABS Take 1 tablet by mouth daily.   Yes [provider]  Multiple Vitamins-Minerals (PRESERVISION  AREDS 2 PO) Take 1 capsule by mouth daily.   Yes [provider]  SYMBICORT 160-4.5 MCG/ACT inhaler Inhale 2 puffs into the lungs daily as needed (wheezing). 10/31/20  Yes [provider]  chlorpheniramine-HYDROcodone (TUSSIONEX PENNKINETIC ER) 10-8 MG/5ML SUER Take 5 mLs by mouth every 12 (twelve) hours as needed for cough. Patient not taking: No sig reported 02/25/21   [name], MD  Nutritional Supplements (,FEEDING SUPPLEMENT, PROSOURCE PLUS) liquid Take 30 mLs by mouth 3 (three) times daily between meals. Patient not taking: No sig reported 01/22/21   [name], DO  Vitamin D, Ergocalciferol, (DRISDOL) 1.25 MG (50000 UNIT) CAPS capsule Take 50,000 Units by mouth every 7 (seven) days. Patient not taking: No sig reported 07/08/20   [provider]    Inpatient Medications: Scheduled Meds:  aspirin EC  81 mg Oral Daily   midodrine  10 mg Oral TID WC   mirtazapine  7.5 mg Oral QHS   mometasone-formoterol  2 puff Inhalation BID   Continuous Infusions:  sodium chloride Stopped (04/22/21 2216)   lactated ringers 125 mL/hr at 04/23/21 0630   PRN Meds: sodium chloride, acetaminophen **OR** acetaminophen, ondansetron **OR** ondansetron (ZOFRAN) IV  Allergies:    Allergies  Allergen Reactions   Penicillins Hives and Shortness Of Breath   Lisinopril Itching    Social History:   Social History   Socioeconomic History   Marital status: Married    Spouse name: Not on file   Number of children: Not on file   Years of education: Not on file   Highest education level: Not on file  Occupational History   Not on file  Tobacco Use   Smoking status: Former    Packs/day: 0.30    Years: 61.00    Pack years: 18.30    Types: Cigarettes    Start date: 83    Quit date: 03/03/2020    Years since quitting: 1.1   Smokeless tobacco: Never  Vaping Use   Vaping Use: Never used  Substance and Sexual Activity   Alcohol use: Not Currently   Drug use: No    Sexual activity: Not Currently  Other Topics Concern   Not on file  Social History Narrative   Not on file   Social Determinants of Health   Financial Resource Strain: Not on file  Food Insecurity: Not on file  Transportation Needs: Not on file  Physical Activity: Not on file  Stress: Not on file  Social Connections: Not on file  Intimate Partner Violence: Not on file    Family History:   Family History  Problem Relation Age of Onset   Alzheimer's disease Mother      ROS:  Please see the history of present illness.  All other ROS reviewed and negative.     Physical Exam/Data:   Vitals:   04/23/21 0530 04/23/21 0600 04/23/21 0630 04/23/21 0700  BP: (!) 146/83 (!) 152/73 (!) 156/67 Marland Kitchen)  165/84  Pulse: 63 (!) 48 (!) 48 (!) 46  Resp: (!) 24 (!) 21 (!) 23 16  Temp:      TempSrc:      SpO2: 95% 97% 94% 95%  Weight:      Height:        Intake/Output Summary (Last 24 hours) at 04/23/2021 0831 Last data filed at 04/22/2021 1550 Gross per 24 hour  Intake 500 ml  Output --  Net 500 ml   Last 3 Weights 04/22/2021 04/21/2021 02/25/2021  Weight (lbs) 143 lb 4.8 oz 143 lb 12.8 oz 148 lb 6.4 oz  Weight (kg) 65 kg 65.227 kg 67.314 kg     Body mass index is 18.91 kg/m.  General:  Well nourished, well developed, in no acute distress, appears chronically ill HEENT: normal Neck: no JVD Vascular: No carotid bruits; Distal pulses 2+ bilaterally Cardiac:  RRR; no murmurs, gallops or rubs Lungs:  CTA b/l, no wheezing, rhonchi or rales  Abd: soft, nontender, no hepatomegaly  Ext: no edema Musculoskeletal:  No deformities, advanced atrophy Skin: warm and dry  Neuro:  no focal abnormalities noted Psych:  Normal affect   EKG:  The EKG was personally reviewed and demonstrates:    AFib 121bpm, diffuse ST/T changes SR 70bpm, no ischemic looking changes SB 44bpm, unchanged otherwise  Telemetry:  Telemetry was personally reviewed and demonstrates:   Afib 110's-120's SR/SB  40's-60;s, no post conversion pause noted  Relevant CV Studies:  Monitor 03/28/2021: Sinus rhythm with average heart rate of 53 bpm Rare PVCs, 2% of of overall beats, rare PACs less than 1%. Minimum heart rate 33 bpm sinus rhythm at 8:11 AM on day #13-no symptoms associated with this. 7 beat run of slow ventricular tachycardia at 105 bpm at 4:40 AM, no symptoms reported Slow idioventricular rhythm heart rate in the 30s lasting 6 to 7 seconds in duration-asymptomatic, recorded during sleep 2-1 AV block 36 bpm 8:15 AM day #15 noted. Asymptomatic. Critical value reported.   Consider further evaluation by electrophysiology given underlying conduction disease.   LE Venous DVT 01/19/2021: Summary:  BILATERAL:  - No evidence of deep vein thrombosis seen in the lower extremities,  bilaterally.  - No evidence of superficial venous thrombosis in the lower extremities,  bilaterally.  -  RIGHT:  - No cystic structure found in the popliteal fossa.     LEFT:  - A cystic structure is found in the popliteal fossa.   Echo 01/17/2021:  1. Left ventricular ejection fraction, by estimation, is 55 to 60%. The  left ventricle has normal function. The left ventricle has no regional  wall motion abnormalities. There is mild left ventricular hypertrophy of  the basal-septal segment. Left  ventricular diastolic parameters were normal.   2. Right ventricular systolic function is normal. The right ventricular  size is normal.   3. The mitral valve is normal in structure. No evidence of mitral valve  regurgitation. No evidence of mitral stenosis.   4. The aortic valve is tricuspid. Aortic valve regurgitation is not  visualized. No aortic stenosis is present.   5. The inferior vena cava is normal in size with greater than 50%  respiratory variability, suggesting right atrial pressure of 3 mmHg.    CTA Chest 01/16/2021: COMPARISON:  04/17/2010   FINDINGS: Cardiovascular: Atherosclerotic calcifications of  the aorta are noted. No significant aortic opacification is identified to suggest dissection. No aneurysmal dilatation is noted. No cardiac enlargement is seen. Scattered coronary calcifications are noted. The  pulmonary artery shows a normal branching pattern bilaterally. Tiny right middle lobe pulmonary emboli are seen. No other significant emboli are noted. No right heart strain is seen.   Mediastinum/Nodes: Thoracic inlet is within normal limits. No sizable hilar or mediastinal adenopathy is noted. The esophagus demonstrates some retained fluid and food stuffs. This may be related to reflux.   Lungs/Pleura: Lungs are well aerated bilaterally. Minimal dependent atelectatic changes are seen. No focal infiltrate or effusion is noted. No evidence of metastatic disease is seen.   Upper Abdomen: Visualized upper abdomen shows renal cystic change on the left as well as cysts within the left lobe of the liver. No other focal abnormality in the upper abdomen is seen.   Musculoskeletal: Degenerative changes of the thoracic spine are   Review of the MIP images confirms the above findings.   IMPRESSION: Small pulmonary emboli involving the right middle lobe as described.   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   Aortic Atherosclerosis (ICD10-I70.0).  Laboratory Data:  High Sensitivity Troponin:   Recent Labs  Lab 04/22/21 1649 04/22/21 1953 04/23/21 0645  TROPONINIHS 56* 122* 124*     Chemistry Recent Labs  Lab 04/22/21 1649  NA 141  K 2.8*  CL 102  CO2 28  GLUCOSE 141*  BUN 30*  CREATININE 1.74*  CALCIUM 8.8*  MG 1.8  GFRNONAA 39*  ANIONGAP 11    No results for input(s): PROT, ALBUMIN, AST, ALT, ALKPHOS, BILITOT in the last 168 hours. Lipids No results for input(s): CHOL, TRIG, HDL, LABVLDL, LDLCALC, CHOLHDL in the last 168 hours.  Hematology Recent Labs  Lab 04/22/21 1649  WBC 11.3*  RBC 3.78*  HGB 11.9*  HCT 33.9*  MCV 89.7  MCH 31.5  MCHC 35.1   RDW 12.7  PLT 171   Thyroid  Recent Labs  Lab 04/22/21 1649  TSH 1.591    BNPNo results for input(s): BNP, PROBNP in the last 168 hours.  DDimer No results for input(s): DDIMER in the last 168 hours.   Radiology/Studies:  CT HEAD WO CONTRAST (5MM) Result Date: 04/22/2021 CLINICAL DATA:  Head trauma syncope EXAM: CT HEAD WITHOUT CONTRAST TECHNIQUE: Contiguous axial images were obtained from the base of the skull through the vertex without intravenous contrast. COMPARISON:  CT brain 01/16/2021 FINDINGS: Brain: No acute territorial infarction, hemorrhage or intracranial mass. Mild atrophy. Nonenlarged ventricles. Vascular: No hyperdense vessels. Mild carotid vascular calcification Skull: Normal. Negative for fracture or focal lesion. Sinuses/Orbits: No acute finding. Other: None IMPRESSION: Negative non contrasted CT appearance of the brain for age Electronically Signed   By: Donavan Foil M.D.   On: 04/22/2021 18:03   DG Chest Port 1 View Result Date: 04/22/2021 CLINICAL DATA:  Shortness of breath EXAM: PORTABLE CHEST 1 VIEW COMPARISON:  02/19/2021 FINDINGS: Heart is normal size. Aortic atherosclerosis. No confluent airspace opacities or effusions. No acute bony abnormality. IMPRESSION: No active cardiopulmonary disease. Electronically Signed   By: Rolm Baptise M.D.   On: 04/22/2021 17:28     Assessment and Plan:   Recurrent near syncope and syncope Orthostatic hypotension/dizziness, known for him New paroxysmal Afib  Symptoms and syncope are multifactorial Dr. Curt Bears has seen/examined the patient, reviewed monitor tracings as well without clear AVblock, + SB  Lengthy discussion with the patient Symptoms and syncope are multifactorial Baseline, probably asymptomatic, longstanding SB, though with new fast AFib tachy-brady, PPM implant is reasonable Discussed pacing Destiny Trickey not cure all of his symptoms Pt understands and is agreeable to  proceed  Historically intolerant of a/c for  small PE with GIB Follow AF burden via device  4. Hypokalemia He has gotten 2 runs of 28meq and 2 PO doses of 8meq Cheyan Frees follow up level  5. Abn HS Trop No anginal symptoms or EKG changes Likely 2/2 RVR   Further with IM team   Risk Assessment/Risk Scores:  {  For questions or updates, please contact [place] Please consult [email or web address] for contact info under    Signed, Baldwin Jamaica, PA-C  04/23/2021 8:31 AM  I have seen and examined this patient with Tommye Standard.  Agree with above, note added to reflect my findings.  On exam, RRR, no murmurs.  Patient presented to the hospital after an episode of syncope.  He currently feels well.  He has a history of severe orthostasis and is on midodrine.  His syncope did sound orthostatic in nature.  When he was in the ambulance as well as in the emergency room, he has had short episodes of atrial fibrillation with rapid rates.  He has resting bradycardia.  I think that he would benefit from pacemaker implant due to his tachybradycardia syndrome and resting bradycardia.  Pacemaker implant may help with his orthostatic hypotension.  Homer Miller Finks has presented today for surgery, with the diagnosis of tachy/brady, orthostasis.  The various methods of treatment have been discussed with the patient and family. After consideration of risks, benefits and other options for treatment, the patient has consented to  Procedure(s): Pacemaker implant as a surgical intervention .  Risks include but not limited to bleeding, infection, pneumothorax, perforation, tamponade, vascular damage, renal failure, MI, stroke, death, and lead dislodgement . The patient's history has been reviewed, patient examined, no change in status, stable for surgery.  I have reviewed the patient's chart and labs.  Questions were answered to the patient's satisfaction.    Cillian Gwinner Curt Bears, MD 04/23/2021 11:17 AM   Fionn Stracke M. Richard Ritchey MD 04/23/2021 11:16 AM

## 2021-04-23 NOTE — Progress Notes (Signed)
   04/23/21 1531  Assess: MEWS Score  Temp (!) 97.2 F (36.2 C)  BP (!) 212/95  Pulse Rate (!) 48  ECG Heart Rate (!) 47  Resp 20  SpO2 97 %  O2 Device Room Air  Assess: MEWS Score  MEWS Temp 0  MEWS Systolic 2  MEWS Pulse 1  MEWS RR 0  MEWS LOC 0  MEWS Score 3  MEWS Score Color Yellow  Assess: if the MEWS score is Yellow or Red  Were vital signs taken at a resting state? Yes  Focused Assessment No change from prior assessment  Early Detection of Sepsis Score *See Row Information* Low  MEWS guidelines implemented *See Row Information* Yes  Treat  MEWS Interventions Other (Comment) (awaiting pacemaker placement)  Pain Scale 0-10  Pain Score 0  Take Vital Signs  Increase Vital Sign Frequency  Yellow: Q 2hr X 2 then Q 4hr X 2, if remains yellow, continue Q 4hrs  Escalate  MEWS: Escalate Yellow: discuss with charge nurse/RN and consider discussing with provider and RRT  Notify: Charge Nurse/RN  Name of Charge Nurse/RN Notified Christy RN  Date Charge Nurse/RN Notified 04/23/21  Time Charge Nurse/RN Notified 1551  Document  Patient Outcome Other (Comment) (remains stable.)  Progress note created (see row info) Yes   Patient arrived from ED at 1530hrs. Oriented to unit and plan of care for shift.  BP remains elevated as it was noted to be in ED.  Will continue to monitor.

## 2021-04-23 NOTE — Progress Notes (Addendum)
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  Initially noted to be in atrial fibrillation with RVR.  Subsequently transition to sinus bradycardia.  Patient was hospitalized.  Cardiology was consulted.    Reason for Visit: Paroxysmal atrial fibrillation, sinus bradycardia  Consultants: Cardiology  Procedures: None yet    Subjective/Interval History: Patient denies any chest pain or shortness of breath this morning.  No further passing out episodes.  He did have a fall a few days ago and injured the left side of his chest so his ribs on that side continue to hurt.      Assessment/Plan:  Syncope Likely multifactorial including orthostatic hypotension along with atrial fibrillation.  There is also suspicion for sinus node dysfunction since he has gone from tachycardia to bradycardia.  Electrolyte abnormalities may also have contributed.  Continue to monitor on telemetry.  Had a recent echocardiogram in July.  Not repeated.  Cardiology consulted.  They have consulted electrophysiology to consider pacemaker placement.  Paroxysmal atrial fibrillation/sinus bradycardia Presented with a rapid atrial fibrillation with heart rate in the axis of 140 bpm.  Spontaneously converted to sinus bradycardia without intervention.  See above.   Patient has previously not tolerated anticoagulation due to GI bleed.  His CHADS2 vascular score is 2.  Will defer discussion and initiation of anticoagulation to cardiology.  Mildly elevated troponin level Appears to be due to tachyarrhythmia rather than ACS.  Continue to monitor.  Aspirin being continued.  Chronic kidney disease stage IIIa/hypokalemia Baseline creatinine appears to be between 1.2 and 1.5.  Presented with slightly elevated creatinine of 1.74.  We will recheck today.  Monitor urine output.  Avoid nephrotoxic agents. Electrolyte abnormality likely due to his chronic diarrhea.  Potassium was repleted yesterday.  We will recheck labs today.  Magnesium is 1.8.  Chronic diarrhea/short gut syndrome Longstanding chronic diarrhea secondary to short gut syndrome.  Antimotility agents as needed.  Orthostatic hypotension Several month history of same.  Remains on midodrine.  Depression Continue home medications.  History of malignant carcinoid tumor of small intestine Outpatient follow-up with oncology.   DVT Prophylaxis:SCD Code Status: Full code Family Communication: No family at bedside.  Discussed with patient Disposition Plan: Hopefully return home when improved  Status is: Observation  The patient will require care spanning > 2 midnights and should be moved to inpatient because: Need for pacemaker placement    Medications: Scheduled:  aspirin EC  81 mg Oral Daily   chlorhexidine  60 mL Topical Once   chlorhexidine  60 mL Topical Once   gentamicin irrigation  80 mg Irrigation On Call   midodrine  10 mg Oral TID WC   mirtazapine  7.5 mg Oral QHS   mometasone-formoterol  2 puff Inhalation BID   sodium chloride flush  3 mL Intravenous Q12H   Continuous:  sodium chloride Stopped (04/22/21 2216)   sodium chloride     sodium chloride     lactated ringers 125 mL/hr at  04/23/21 0630   vancomycin     BHA:LPFXTK chloride, acetaminophen **OR** acetaminophen,  ondansetron **OR** ondansetron (ZOFRAN) IV, sodium chloride flush  Antibiotics: Anti-infectives (From admission, onward)    Start     Dose/Rate Route Frequency Ordered Stop   04/23/21 0900  gentamicin (GARAMYCIN) 80 mg in sodium chloride 0.9 % 500 mL irrigation        80 mg Irrigation On call 04/23/21 0855 04/24/21 0900   04/23/21 0900  vancomycin (VANCOCIN) IVPB 1000 mg/200 mL premix        1,000 mg 200 mL/hr over 60 Minutes Intravenous On call 04/23/21 0855 04/24/21 0900       Objective:  Vital Signs  Vitals:   04/23/21 0530 04/23/21 0600 04/23/21 0630 04/23/21 0700  BP: (!) 146/83 (!) 152/73 (!) 156/67 (!) 165/84  Pulse: 63 (!) 48 (!) 48 (!) 46  Resp: (!) 24 (!) 21 (!) 23 16  Temp:      TempSrc:      SpO2: 95% 97% 94% 95%  Weight:      Height:        Intake/Output Summary (Last 24 hours) at 04/23/2021 1019 Last data filed at 04/22/2021 1550 Gross per 24 hour  Intake 500 ml  Output --  Net 500 ml   Filed Weights   04/22/21 1553  Weight: 65 kg    General appearance: Awake alert.  In no distress Resp: Clear to auscultation bilaterally.  Normal effort Cardio: S1-S2 is normal regular.  No S3-S4.  No rubs murmurs or bruit GI: Abdomen is soft.  Nontender nondistended.  Bowel sounds are present normal.  No masses organomegaly Extremities: No edema.  Full range of motion of lower extremities. Neurologic: Alert and oriented x3.  No focal neurological deficits.    Lab Results:  Data Reviewed: I have personally reviewed following labs and imaging studies  CBC: Recent Labs  Lab 04/22/21 1649  WBC 11.3*  NEUTROABS 9.5*  HGB 11.9*  HCT 33.9*  MCV 89.7  PLT 240    Basic Metabolic Panel: Recent Labs  Lab 04/22/21 1649  NA 141  K 2.8*  CL 102  CO2 28  GLUCOSE 141*  BUN 30*  CREATININE 1.74*  CALCIUM 8.8*  MG 1.8    GFR: Estimated Creatinine Clearance: 31.6 mL/min (A) (by C-G formula based on SCr of 1.74 mg/dL (H)).    Thyroid Function  Tests: Recent Labs    04/22/21 1649  TSH 1.591      Recent Results (from the past 240 hour(s))  Resp Panel by RT-PCR (Flu A&B, Covid) Nasopharyngeal Swab     Status: None   Collection Time: 04/22/21  7:56 PM   Specimen: Nasopharyngeal Swab; Nasopharyngeal(NP) swabs in vial transport medium  Result Value Ref Range Status   SARS Coronavirus 2 by RT PCR NEGATIVE NEGATIVE Final    Comment: (NOTE) SARS-CoV-2 target nucleic acids are NOT DETECTED.  The SARS-CoV-2 RNA is generally detectable in upper respiratory specimens during the acute phase of infection. The lowest concentration of SARS-CoV-2 viral copies this assay can detect is 138 copies/mL. A negative result does not preclude SARS-Cov-2 infection and should not be used as the sole basis for treatment or other patient management decisions. A negative result may occur with  improper specimen collection/handling, submission of specimen other than nasopharyngeal swab, presence of viral mutation(s) within the areas targeted by this assay, and inadequate number of viral copies(<138 copies/mL). A negative result must be combined with clinical observations, patient history, and  epidemiological information. The expected result is Negative.  Fact Sheet for Patients:  EntrepreneurPulse.com.au  Fact Sheet for Healthcare Providers:  IncredibleEmployment.be  This test is no t yet approved or cleared by the [place] FDA and  has been authorized for detection and/or diagnosis of SARS-CoV-2 by FDA under an Emergency Use Authorization (EUA). This EUA will remain  in effect (meaning this test can be used) for the duration of the COVID-19 declaration under Section 564(b)(1) of the Act, 21 U.S.C.section 360bbb-3(b)(1), unless the authorization is terminated  or revoked sooner.       Influenza A by PCR NEGATIVE NEGATIVE Final   Influenza B by PCR NEGATIVE NEGATIVE Final    Comment: (NOTE) The Xpert  Xpress SARS-CoV-2/FLU/RSV plus assay is intended as an aid in the diagnosis of influenza from Nasopharyngeal swab specimens and should not be used as a sole basis for treatment. Nasal washings and aspirates are unacceptable for Xpert Xpress SARS-CoV-2/FLU/RSV testing.  Fact Sheet for Patients: EntrepreneurPulse.com.au  Fact Sheet for Healthcare Providers: IncredibleEmployment.be  This test is not yet approved or cleared by the [place] FDA and has been authorized for detection and/or diagnosis of SARS-CoV-2 by FDA under an Emergency Use Authorization (EUA). This EUA will remain in effect (meaning this test can be used) for the duration of the COVID-19 declaration under Section 564(b)(1) of the Act, 21 U.S.C. section 360bbb-3(b)(1), unless the authorization is terminated or revoked.  Performed at [facility], [place] [address] [place], [place] [address]       Radiology Studies: CT HEAD WO CONTRAST (5MM)  Result Date: 04/22/2021 CLINICAL DATA:  Head trauma syncope EXAM: CT HEAD WITHOUT CONTRAST TECHNIQUE: Contiguous axial images were obtained from the base of the skull through the vertex without intravenous contrast. COMPARISON:  CT brain 01/16/2021 FINDINGS: Brain: No acute territorial infarction, hemorrhage or intracranial mass. Mild atrophy. Nonenlarged ventricles. Vascular: No hyperdense vessels. Mild carotid vascular calcification Skull: Normal. Negative for fracture or focal lesion. Sinuses/Orbits: No acute finding. Other: None IMPRESSION: Negative non contrasted CT appearance of the brain for age Electronically Signed   By: Donavan Foil M.D.   On: 04/22/2021 18:03   DG Chest Port 1 View  Result Date: 04/22/2021 CLINICAL DATA:  Shortness of breath EXAM: PORTABLE CHEST 1 VIEW COMPARISON:  02/19/2021 FINDINGS: Heart is normal size. Aortic atherosclerosis. No confluent airspace opacities or effusions. No acute bony abnormality.  IMPRESSION: No active cardiopulmonary disease. Electronically Signed   By: Rolm Baptise M.D.   On: 04/22/2021 17:28       LOS: 0 days   [facility] Pager on [email or web address]  04/23/2021, 10:19 AM

## 2021-04-24 ENCOUNTER — Inpatient Hospital Stay (HOSPITAL_COMMUNITY): Payer: Medicare Other

## 2021-04-24 ENCOUNTER — Encounter (HOSPITAL_COMMUNITY): Payer: Self-pay | Admitting: Cardiology

## 2021-04-24 DIAGNOSIS — I480 Paroxysmal atrial fibrillation: Secondary | ICD-10-CM | POA: Diagnosis not present

## 2021-04-24 DIAGNOSIS — R55 Syncope and collapse: Secondary | ICD-10-CM | POA: Diagnosis not present

## 2021-04-24 LAB — BASIC METABOLIC PANEL
Anion gap: 6 (ref 5–15)
BUN: 19 mg/dL (ref 8–23)
CO2: 30 mmol/L (ref 22–32)
Calcium: 9 mg/dL (ref 8.9–10.3)
Chloride: 104 mmol/L (ref 98–111)
Creatinine, Ser: 1.39 mg/dL — ABNORMAL HIGH (ref 0.61–1.24)
GFR, Estimated: 52 mL/min — ABNORMAL LOW (ref >60)
Glucose, Bld: 125 mg/dL — ABNORMAL HIGH (ref 70–99)
Potassium: 3.1 mmol/L — ABNORMAL LOW (ref 3.5–5.1)
Sodium: 140 mmol/L (ref 135–145)

## 2021-04-24 LAB — CBC
HCT: 36.3 % — ABNORMAL LOW (ref 39.0–52.0)
Hemoglobin: 13 g/dL (ref 13.0–17.0)
MCH: 31.5 pg (ref 26.0–34.0)
MCHC: 35.8 g/dL (ref 30.0–36.0)
MCV: 87.9 fL (ref 80.0–100.0)
Platelets: 165 10*3/uL (ref 150–400)
RBC: 4.13 MIL/uL — ABNORMAL LOW (ref 4.22–5.81)
RDW: 12.5 % (ref 11.5–15.5)
WBC: 9.8 10*3/uL (ref 4.0–10.5)
nRBC: 0 % (ref 0.0–0.2)

## 2021-04-24 LAB — MAGNESIUM: Magnesium: 1.7 mg/dL (ref 1.7–2.4)

## 2021-04-24 MED ORDER — POTASSIUM CHLORIDE CRYS ER 20 MEQ PO TBCR
40.0000 meq | EXTENDED_RELEASE_TABLET | Freq: Once | ORAL | Status: AC
  Administered 2021-04-24: 40 meq via ORAL
  Filled 2021-04-24: qty 2

## 2021-04-24 MED ORDER — MAGNESIUM SULFATE 2 GM/50ML IV SOLN
2.0000 g | Freq: Once | INTRAVENOUS | Status: AC
  Administered 2021-04-24: 2 g via INTRAVENOUS
  Filled 2021-04-24: qty 50

## 2021-04-24 MED FILL — Lidocaine HCl Local Inj 1%: INTRAMUSCULAR | Qty: 45 | Status: AC

## 2021-04-24 MED FILL — Lidocaine HCl Local Inj 1%: INTRAMUSCULAR | Qty: 20 | Status: AC

## 2021-04-24 NOTE — Plan of Care (Signed)

## 2021-04-24 NOTE — Progress Notes (Signed)
CXR this AM without ptx.  OK to discharge from EP perspective when ready medically otherwise EP follow up is in place  [place], [place]

## 2021-04-24 NOTE — Discharge Summary (Signed)
[facility]  Physician Discharge Summary   Patient ID: Alan [name] MRN: [id number] DOB/AGE: 79-11-1940 79 y.o.  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 History of orthostatic hypotension   RECOMMENDATIONS FOR OUTPATIENT FOLLOW UP: Cardiology to arrange outpatient follow-up after pacemaker placement    Home Health: None Equipment/Devices: None  CODE STATUS: Full code  DISCHARGE CONDITION: fair  Diet recommendation: As before  INITIAL HISTORY: 80 year old male with past medical history of chronic kidney disease stage IIIa, hypertension, orthostatic hypotension (on chronic midodrine) history of GI bleed (upper suspected, 12/2020 managed conservatively), pulmonary embolism (12/2020, treated conservatively), carcinoid syndrome (Stage IV, Dx 2011, 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  Initially noted to be in atrial fibrillation with RVR.  Subsequently transition to sinus bradycardia.  Patient was hospitalized.  Cardiology was consulted.    Consultants: Cardiology   Procedures: Pacemaker placement   HOSPITAL COURSE:   Syncope/tachybradycardia syndrome Likely multifactorial including orthostatic hypotension along with atrial fibrillation.  There is also suspicion for sinus node dysfunction since he went from tachycardia to bradycardia.  Electrolyte abnormalities may also have contributed.   Had a recent echocardiogram in July.  Not repeated.  Cardiology consulted.  They consulted  electrophysiology to consider pacemaker placement.  Subsequently a pacemaker was placed.   Paroxysmal atrial fibrillation/sinus bradycardia Presented with a rapid atrial fibrillation with heart rate in the axis of 140 bpm.  Spontaneously converted to sinus bradycardia without intervention.  See above.  Patient has previously not tolerated anticoagulation due to GI bleed.  His CHADS2 vascular score is 2.  No anticoagulation for now per cardiology.   Mildly elevated troponin level Appears to be due to tachyarrhythmia rather than ACS.     Chronic kidney disease stage IIIa/hypokalemia Baseline creatinine appears to be between 1.2 and 1.5.  Presented with slightly elevated creatinine of 1.74.  Likely due to chronic diarrhea.  Improved with hydration.  Potassium to be repleted prior to discharge today.  Magnesium 1.7.     Chronic diarrhea/short gut syndrome Longstanding chronic diarrhea secondary to short gut syndrome.  Antimotility agents as needed.  Orthostatic hypotension Several month history of same.  Remains on midodrine.   Depression Continue home medications.  History of malignant carcinoid tumor of small intestine Outpatient follow-up with oncology.     Patient is stable.  Okay for discharge home today.   PERTINENT LABS:  The results of significant diagnostics from this hospitalization (including imaging, microbiology, ancillary and laboratory) are listed below for reference.    Microbiology: Recent Results (from the past 240 hour(s))  Resp Panel by RT-PCR (Flu A&B, Covid) Nasopharyngeal Swab     Status: None   Collection Time: 04/22/21  7:56 PM   Specimen: Nasopharyngeal Swab; Nasopharyngeal(NP) swabs in vial transport medium  Result Value Ref Range Status   SARS Coronavirus 2 by RT PCR NEGATIVE NEGATIVE Final    Comment: (NOTE) SARS-CoV-2 target nucleic acids are NOT DETECTED.  The SARS-CoV-2 RNA is generally detectable in upper respiratory specimens during the acute  phase of infection. The lowest concentration of SARS-CoV-2 viral copies this assay can detect  is 138 copies/mL. A negative result does not preclude SARS-Cov-2 infection and should not be used as the sole basis for treatment or other patient management decisions. A negative result may occur with  improper specimen collection/handling, submission of specimen other than nasopharyngeal swab, presence of viral mutation(s) within the areas targeted by this assay, and inadequate number of viral copies(<138 copies/mL). A negative result must be combined with clinical observations, patient history, and epidemiological information. The expected result is Negative.  Fact Sheet for Patients:  EntrepreneurPulse.com.au  Fact Sheet for Healthcare Providers:  IncredibleEmployment.be  This test is no t yet approved or cleared by the [place] FDA and  has been authorized for detection and/or diagnosis of SARS-CoV-2 by FDA under an Emergency Use Authorization (EUA). This EUA will remain  in effect (meaning this test can be used) for the duration of the COVID-19 declaration under Section 564(b)(1) of the Act, 21 U.S.C.section 360bbb-3(b)(1), unless the authorization is terminated  or revoked sooner.       Influenza A by PCR NEGATIVE NEGATIVE Final   Influenza B by PCR NEGATIVE NEGATIVE Final    Comment: (NOTE) The Xpert Xpress SARS-CoV-2/FLU/RSV plus assay is intended as an aid in the diagnosis of influenza from Nasopharyngeal swab specimens and should not be used as a sole basis for treatment. Nasal washings and aspirates are unacceptable for Xpert Xpress SARS-CoV-2/FLU/RSV testing.  Fact Sheet for Patients: EntrepreneurPulse.com.au  Fact Sheet for Healthcare Providers: IncredibleEmployment.be  This test is not yet approved or cleared by the [place] FDA and has been authorized for detection and/or diagnosis of  SARS-CoV-2 by FDA under an Emergency Use Authorization (EUA). This EUA will remain in effect (meaning this test can be used) for the duration of the COVID-19 declaration under Section 564(b)(1) of the Act, 21 U.S.C. section 360bbb-3(b)(1), unless the authorization is terminated or revoked.  Performed at [facility], [place] [address], [place], [place] [address]   Surgical PCR screen     Status: None   Collection Time: 04/23/21  4:02 PM   Specimen: Nasal Mucosa; Nasal Swab  Result Value Ref Range Status   MRSA, PCR NEGATIVE NEGATIVE Final   Staphylococcus aureus NEGATIVE NEGATIVE Final    Comment: (NOTE) The Xpert SA Assay (FDA approved for NASAL specimens in patients 13 years of age and older), is one component of a comprehensive surveillance program. It is not intended to diagnose infection nor to guide or monitor treatment. Performed at [facility], [place] [address] [place], [place] [address]      Labs:  COVID-19 Labs   Lab Results  Component Value Date   [place] 04/22/2021   [place] NEGATIVE 01/16/2021      Basic Metabolic Panel: Recent Labs  Lab 04/22/21 1649 04/23/21 1251 04/24/21 0056  NA 141 142 140  K 2.8* 3.5 3.1*  CL 102 105 104  CO2 28 31 30   GLUCOSE 141* 123* 125*  BUN 30* 23 19  CREATININE 1.74* 1.41* 1.39*  CALCIUM 8.8* 9.0 9.0  MG 1.8  --  1.7    CBC: Recent Labs  Lab 04/22/21 1649 04/24/21 0056  WBC 11.3* 9.8  NEUTROABS 9.5*  --   HGB 11.9* 13.0  HCT 33.9* 36.3*  MCV 89.7 87.9  PLT 171 165    IMAGING STUDIES DG Chest 2 View  Result Date: 04/24/2021 CLINICAL DATA:  Status post pacer placement. EXAM: CHEST - 2 VIEW COMPARISON:  02/19/2021 FINDINGS: There is a left chest wall  pacer device with leads in the right atrial appendage and right ventricle. No pneumothorax following pacer placement. Stable cardiomediastinal contours. Aortic atherosclerosis. No airspace opacities. No pleural effusion or edema.  IMPRESSION: No active cardiopulmonary abnormalities. Electronically Signed   By: Kerby Moors M.D.   On: 04/24/2021 09:12   CT HEAD WO CONTRAST (5MM)  Result Date: 04/22/2021 CLINICAL DATA:  Head trauma syncope EXAM: CT HEAD WITHOUT CONTRAST TECHNIQUE: Contiguous axial images were obtained from the base of the skull through the vertex without intravenous contrast. COMPARISON:  CT brain 01/16/2021 FINDINGS: Brain: No acute territorial infarction, hemorrhage or intracranial mass. Mild atrophy. Nonenlarged ventricles. Vascular: No hyperdense vessels. Mild carotid vascular calcification Skull: Normal. Negative for fracture or focal lesion. Sinuses/Orbits: No acute finding. Other: None IMPRESSION: Negative non contrasted CT appearance of the brain for age Electronically Signed   By: Donavan Foil M.D.   On: 04/22/2021 18:03   Cardiac event monitor  Result Date: 03/31/2021  Sinus rhythm with average heart rate of 53 bpm  Rare PVCs, 2% of of overall beats, rare PACs less than 1%.  Minimum heart rate 33 bpm sinus rhythm at 8:11 AM on day #13-no symptoms associated with this.  7 beat run of slow ventricular tachycardia at 105 bpm at 4:40 AM, no symptoms reported  Slow idioventricular rhythm heart rate in the 30s lasting 6 to 7 seconds in duration-asymptomatic, recorded during sleep  2-1 AV block 36 bpm 8:15 AM day #15 noted. Asymptomatic. Critical value reported.  Consider further evaluation by electrophysiology given underlying conduction disease. Candee Furbish, MD  EP PPM/ICD IMPLANT  Result Date: 04/23/2021 SURGEON:  Will Meredith Leeds, MD   PREPROCEDURE DIAGNOSIS:  tachy/brady syndrome, orthostatic syncope   POSTPROCEDURE DIAGNOSIS:  tachy/brady syndrome, orthostatic syncope    PROCEDURES:  1. Pacemaker implantation.   INTRODUCTION: Alan [name] is a 81 y.o. male  with a history of bradycardia who presents today for pacemaker implantation.  The patient reports intermittent episodes of syncope over  the past few months.  No reversible causes have been identified.  The patient therefore presents today for pacemaker implantation.   DESCRIPTION OF PROCEDURE:  Informed written consent was obtained, and  the patient was brought to the electrophysiology lab in a fasting state.  The patient required no sedation for the procedure today.  The patients left chest was prepped and draped in the usual sterile fashion by the EP lab staff. The skin overlying the left deltopectoral region was infiltrated with lidocaine for local analgesia.  A 4-cm incision was made over the left deltopectoral region.  A left subcutaneous pacemaker pocket was fashioned using a combination of sharp and blunt dissection. Electrocautery was required to assure hemostasis.  RA/RV Lead Placement: The left axillary vein was cannulated.  Through the left axillary vein, a Biotronik model [id number] (serial number [id number]) right atrial lead and a Biotronik model [id number] (serial number [id number]) right ventricular lead were advanced with fluoroscopic visualization into the right atrial appendage and right ventricular apex positions respectively.  Initial atrial lead P- waves measured 2.44mV with impedance of 495 ohms and a threshold of 1.5 V at 0.5 msec.  Right ventricular lead R-waves measured 13.9 mV with an impedance of 799 ohms and a threshold of 0.6 V at 0.5 msec.  Both leads were secured to the pectoralis fascia using #2-0 silk over the suture sleeves. Device Placement:  The leads were then connected to a Biotronik Edora 8 DR-T [id number] (serial number [id number]) pacemaker.  The  pocket was irrigated with copious gentamicin solution.  The pacemaker was then placed into the pocket.  The pocket was then closed in 3 layers with 2.0 Vicryl suture for the subcutaneous and 3.0 Vicryl suture subcuticular layers.  Steri-Strips and a sterile dressing were then applied. EBL<48ml. There were no early apparent complications.   CONCLUSIONS:  1. Successful  implantation of a Biotronik Edora 8-DR-T dual-chamber pacemaker for symptomatic bradycardia  2. No early apparent complications.       Will Meredith Leeds, MD 04/23/2021 5:43 PM   DG Chest Port 1 View  Result Date: 04/22/2021 CLINICAL DATA:  Shortness of breath EXAM: PORTABLE CHEST 1 VIEW COMPARISON:  02/19/2021 FINDINGS: Heart is normal size. Aortic atherosclerosis. No confluent airspace opacities or effusions. No acute bony abnormality. IMPRESSION: No active cardiopulmonary disease. Electronically Signed   By: Rolm Baptise M.D.   On: 04/22/2021 17:28    DISCHARGE EXAMINATION: Vitals:   04/24/21 0330 04/24/21 0748 04/24/21 0751 04/24/21 0902  BP: (!) 142/90 (!) 77/51 114/80   Pulse: 73     Resp: 19     Temp: 99.2 F (37.3 C)     TempSrc: Oral     SpO2: 95%   96%  Weight:      Height:       General appearance: Awake alert.  In no distress Resp: Clear to auscultation bilaterally.  Normal effort Cardio: S1-S2 is normal regular.  No S3-S4.  No rubs murmurs or bruit GI: Abdomen is soft.  Nontender nondistended.  Bowel sounds are present normal.  No masses organomegaly    DISPOSITION: Home  Discharge Instructions     Call MD for:  difficulty breathing, headache or visual disturbances   Complete by: As directed    Call MD for:  extreme fatigue   Complete by: As directed    Call MD for:  persistant dizziness or light-headedness   Complete by: As directed    Call MD for:  persistant nausea and vomiting   Complete by: As directed    Call MD for:  redness, tenderness, or signs of infection (pain, swelling, redness, odor or green/yellow discharge around incision site)   Complete by: As directed    Call MD for:  severe uncontrolled pain   Complete by: As directed    Call MD for:  temperature >100.4   Complete by: As directed    Diet - low sodium heart healthy   Complete by: As directed    Discharge instructions   Complete by: As directed    Please take your medications as  prescribed.  Follow instructions provided by the cardiologist.  You were cared for by a hospitalist during your hospital stay. If you have any questions about your discharge medications or the care you received while you were in the hospital after you are discharged, you can call the unit and asked to speak with the hospitalist on call if the hospitalist that took care of you is not available. Once you are discharged, your primary care physician will handle any further medical issues. Please note that NO REFILLS for any discharge medications will be authorized once you are discharged, as it is imperative that you return to your primary care physician (or establish a relationship with a primary care physician if you do not have one) for your aftercare needs so that they can reassess your need for medications and monitor your lab values. If you do not have a primary care physician, you can call [phone] for  a physician referral.   Increase activity slowly   Complete by: As directed    No wound care   Complete by: As directed           Allergies as of 04/24/2021       Reactions   Penicillins Hives, Shortness Of Breath   Lisinopril Itching        Medication List     STOP taking these medications    chlorpheniramine-HYDROcodone 10-8 MG/5ML Suer Commonly known as: Tussionex Pennkinetic ER       TAKE these medications    aspirin 81 MG EC tablet Take 81 mg by mouth daily. Swallow whole.   Ensure Plus Liqd Take 237 mLs by mouth 2 (two) times daily between meals. What changed: Another medication with the same name was removed. Continue taking this medication, and follow the directions you see here.   iron polysaccharides 150 MG capsule Commonly known as: NIFEREX Take 1 capsule (150 mg total) by mouth 3 (three) times a week. What changed: when to take this   LANREOTIDE ACETATE Coy Inject 1 Dose into the skin every 30 (thirty) days.   midodrine 10 MG tablet Commonly known as:  PROAMATINE Take 1 tablet (10 mg total) by mouth 3 (three) times daily.   mirtazapine 7.5 MG tablet Commonly known as: REMERON Take 7.5 mg by mouth daily.   multivitamin with minerals Tabs tablet Take 1 tablet by mouth daily.   PRESERVISION AREDS 2 PO Take 1 capsule by mouth daily.   Symbicort 160-4.5 MCG/ACT inhaler Generic drug: budesonide-formoterol Inhale 2 puffs into the lungs daily as needed (wheezing).   Vitamin D (Ergocalciferol) 1.25 MG (50000 UNIT) Caps capsule Commonly known as: DRISDOL Take 50,000 Units by mouth every 7 (seven) days.          Follow-up Information     [facility].   Specialty: Cardiology Why: 04/28/21 @ 11:00AM, wound check visit Contact information: [address], [address] [place]        Constance Haw, MD Follow up.   Specialty: Cardiology Why: 07/28/21 @ 2:15PM Contact information: [address] [place] [address] [phone]                 TOTAL DISCHARGE TIME: 35 minutes  [facility] Pager on [email or web address]  04/25/2021, 1:14 PM

## 2021-04-24 NOTE — Progress Notes (Addendum)
Progress Note  Patient Name: Alan [name] Date of Encounter: 04/24/2021  [facility] Cardiologist: Dr. Marlou Porch  Subjective   Feels OK< minimal site discomfort, no CP, no SOB  Inpatient Medications    Scheduled Meds:  aspirin EC  81 mg Oral Daily   influenza vaccine adjuvanted  0.5 mL Intramuscular Tomorrow-1000   midodrine  10 mg Oral TID WC   mirtazapine  7.5 mg Oral QHS   mometasone-formoterol  2 puff Inhalation BID   sodium chloride flush  3 mL Intravenous Q12H   Continuous Infusions:  sodium chloride Stopped (04/22/21 2216)   magnesium sulfate bolus IVPB 2 g (04/24/21 0756)   PRN Meds: sodium chloride, acetaminophen **OR** acetaminophen, ondansetron **OR** ondansetron (ZOFRAN) IV   Vital Signs    Vitals:   04/24/21 0032 04/24/21 0100 04/24/21 0330 04/24/21 0748  BP: (!) 168/93 (!) 151/87 (!) 142/90 (!) 77/51  Pulse: 69  73   Resp: 18  19   Temp: 98 F (36.7 C)  99.2 F (37.3 C)   TempSrc: Oral  Oral   SpO2: 100%  95%   Weight:      Height:        Intake/Output Summary (Last 24 hours) at 04/24/2021 0758 Last data filed at 04/24/2021 0100 Gross per 24 hour  Intake 2816.43 ml  Output 550 ml  Net 2266.43 ml   Last 3 Weights 04/23/2021 04/22/2021 04/21/2021  Weight (lbs) 141 lb 4.8 oz 143 lb 4.8 oz 143 lb 12.8 oz  Weight (kg) 64.093 kg 65 kg 65.227 kg      Telemetry    AR, some A paced beats, PVC - Personally Reviewed  ECG    SR, intermittent A pacing, occ PVCs - Personally Reviewed  Physical Exam   GEN: No acute distress, chronically ill appearing.   Neck: No JVD Cardiac: RRR, no murmurs, rubs, or gallops.  Respiratory: CTA b/l GI: Soft, nontender, non-distended  MS: No edema; No deformity, advanced atrophy Neuro:  Nonfocal  Psych: Normal affect   PPM site: no bleeding, + small hematoma  Labs    High Sensitivity Troponin:   Recent Labs  Lab 04/22/21 1649 04/22/21 1953 04/23/21 0645  TROPONINIHS 56* 122* 124*      Chemistry Recent Labs  Lab 04/22/21 1649 04/23/21 1251 04/24/21 0056  NA 141 142 140  K 2.8* 3.5 3.1*  CL 102 105 104  CO2 28 31 30   GLUCOSE 141* 123* 125*  BUN 30* 23 19  CREATININE 1.74* 1.41* 1.39*  CALCIUM 8.8* 9.0 9.0  MG 1.8  --  1.7  GFRNONAA 39* 51* 52*  ANIONGAP 11 6 6     Lipids No results for input(s): CHOL, TRIG, HDL, LABVLDL, LDLCALC, CHOLHDL in the last 168 hours.  Hematology Recent Labs  Lab 04/22/21 1649 04/24/21 0056  WBC 11.3* 9.8  RBC 3.78* 4.13*  HGB 11.9* 13.0  HCT 33.9* 36.3*  MCV 89.7 87.9  MCH 31.5 31.5  MCHC 35.1 35.8  RDW 12.7 12.5  PLT 171 165   Thyroid  Recent Labs  Lab 04/22/21 1649  TSH 1.591    BNPNo results for input(s): BNP, PROBNP in the last 168 hours.  DDimer No results for input(s): DDIMER in the last 168 hours.   Radiology    CT HEAD WO CONTRAST (5MM)  Result Date: 04/22/2021 CLINICAL DATA:  Head trauma syncope EXAM: CT HEAD WITHOUT CONTRAST TECHNIQUE: Contiguous axial images were obtained from the base of the skull through the vertex without  intravenous contrast. COMPARISON:  CT brain 01/16/2021 FINDINGS: Brain: No acute territorial infarction, hemorrhage or intracranial mass. Mild atrophy. Nonenlarged ventricles. Vascular: No hyperdense vessels. Mild carotid vascular calcification Skull: Normal. Negative for fracture or focal lesion. Sinuses/Orbits: No acute finding. Other: None IMPRESSION: Negative non contrasted CT appearance of the brain for age Electronically Signed   By: Donavan Foil M.D.   On: 04/22/2021 18:03   DG Chest Port 1 View  Result Date: 04/22/2021 CLINICAL DATA:  Shortness of breath EXAM: PORTABLE CHEST 1 VIEW COMPARISON:  02/19/2021 FINDINGS: Heart is normal size. Aortic atherosclerosis. No confluent airspace opacities or effusions. No acute bony abnormality. IMPRESSION: No active cardiopulmonary disease. Electronically Signed   By: Rolm Baptise M.D.   On: 04/22/2021 17:28    Cardiac Studies   Monitor  03/28/2021: Sinus rhythm with average heart rate of 53 bpm Rare PVCs, 2% of of overall beats, rare PACs less than 1%. Minimum heart rate 33 bpm sinus rhythm at 8:11 AM on day #13-no symptoms associated with this. 7 beat run of slow ventricular tachycardia at 105 bpm at 4:40 AM, no symptoms reported Slow idioventricular rhythm heart rate in the 30s lasting 6 to 7 seconds in duration-asymptomatic, recorded during sleep 2-1 AV block 36 bpm 8:15 AM day #15 noted. Asymptomatic. Critical value reported.   Consider further evaluation by electrophysiology given underlying conduction disease.   LE Venous DVT 01/19/2021: Summary:  BILATERAL:  - No evidence of deep vein thrombosis seen in the lower extremities,  bilaterally.  - No evidence of superficial venous thrombosis in the lower extremities,  bilaterally.  -  RIGHT:  - No cystic structure found in the popliteal fossa.     LEFT:  - A cystic structure is found in the popliteal fossa.   Echo 01/17/2021:  1. Left ventricular ejection fraction, by estimation, is 55 to 60%. The  left ventricle has normal function. The left ventricle has no regional  wall motion abnormalities. There is mild left ventricular hypertrophy of  the basal-septal segment. Left  ventricular diastolic parameters were normal.   2. Right ventricular systolic function is normal. The right ventricular  size is normal.   3. The mitral valve is normal in structure. No evidence of mitral valve  regurgitation. No evidence of mitral stenosis.   4. The aortic valve is tricuspid. Aortic valve regurgitation is not  visualized. No aortic stenosis is present.   5. The inferior vena cava is normal in size with greater than 50%  respiratory variability, suggesting right atrial pressure of 3 mmHg.    CTA Chest 01/16/2021: COMPARISON:  04/17/2010   FINDINGS: Cardiovascular: Atherosclerotic calcifications of the aorta are noted. No significant aortic opacification is identified to  suggest dissection. No aneurysmal dilatation is noted. No cardiac enlargement is seen. Scattered coronary calcifications are noted. The pulmonary artery shows a normal branching pattern bilaterally. Tiny right middle lobe pulmonary emboli are seen. No other significant emboli are noted. No right heart strain is seen.   Mediastinum/Nodes: Thoracic inlet is within normal limits. No sizable hilar or mediastinal adenopathy is noted. The esophagus demonstrates some retained fluid and food stuffs. This may be related to reflux.   Lungs/Pleura: Lungs are well aerated bilaterally. Minimal dependent atelectatic changes are seen. No focal infiltrate or effusion is noted. No evidence of metastatic disease is seen.   Upper Abdomen: Visualized upper abdomen shows renal cystic change on the left as well as cysts within the left lobe of the liver. No other focal  abnormality in the upper abdomen is seen.   Musculoskeletal: Degenerative changes of the thoracic spine are   Review of the MIP images confirms the above findings.   IMPRESSION: Small pulmonary emboli involving the right middle lobe as described.   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   Aortic Atherosclerosis (ICD10-I70.0).    Patient Profile     80 y.o. male with a hx of malignant carcinoid tumor intstine, chronic diarrhea, CKD (IIIb), HTN with orthostatic hypotension and syncope, short gut syndrome, PE (not on a/c 2/2 GIB) admitted with recurrent syncope and new AFib w/RVR  Long hx of bradycardia felt to be asymptomatic This year with inset of recurrent near syncope and syncope, + orthostatic Outpt monitor abnormal with some (assumed) awake rates 30's, ? 2:1 AVblock asymptomatic Admitted yesterday with recurrent syncope, pt reports despite midodrine escalating symptoms and yesterday syncope occurred faster then usual from onset of symptoms  Hypokalemic with his at baseline poor PO intake and chronic diarrhea, addressed  by attending   Assessment & Plan    Recurrent near syncope and syncope Orthostatic hypotension/dizziness, known for him New paroxysmal Afib Recently a/c stopped (for small PE) 2/2 GIB Luree Palla follow AF burden via device, no a/c for now Tachy-brady  Now s/p PPM implant yesterday  Site with small hematoma, no  bleeding Device check this AM with good measurements CXR reviewed with Dr. Curt Bears, stable lead position, await radiology read Wound care and activity restrictions were discussed with the patient EP follow up is in place Early wound check visit Elfie Costanza be arranged for follow up on small hematoma      4. Hypokalemia Deferred to IM service    Dr. Curt Bears has seen and examined the patient this AM EP service Allexis Bordenave sign off though remain available OK to discharge from EP standpoint (if CXR negative for pneumothorax) when ready medically otherwise   For questions or updates, please contact [place] Please consult [email or web address] for contact info under        Signed, Baldwin Jamaica, PA-C  04/24/2021, 7:58 AM    I have seen and examined this patient with Lorelei Pont.  Agree with above, note added to reflect my findings.  On exam, RRR, no murmurs.  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  Device functioning appropriately.  Chest x-ray and interrogation without issue.  We Mishell Donalson arrange for follow-up in EP clinic.  EP to sign off for now.  Caedan Sumler M. Hannalee Castor MD 04/24/2021 8:49 AM

## 2021-04-25 ENCOUNTER — Encounter (HOSPITAL_BASED_OUTPATIENT_CLINIC_OR_DEPARTMENT_OTHER): Payer: Self-pay

## 2021-04-28 ENCOUNTER — Other Ambulatory Visit: Payer: Self-pay

## 2021-04-28 ENCOUNTER — Ambulatory Visit (INDEPENDENT_AMBULATORY_CARE_PROVIDER_SITE_OTHER): Payer: Medicare Other

## 2021-04-28 DIAGNOSIS — I495 Sick sinus syndrome: Secondary | ICD-10-CM

## 2021-04-28 NOTE — Progress Notes (Signed)
P clinic for eval of Hematoma post Biotronik PPM implant on 04/23/21.  Marinus Maw, PA came in to assess.   Note no worsening compared to previous observation.  Patient educated to continue monitoring, keep upcoming device clinic check.

## 2021-05-06 DIAGNOSIS — R197 Diarrhea, unspecified: Secondary | ICD-10-CM | POA: Diagnosis not present

## 2021-05-06 DIAGNOSIS — C7A019 Malignant carcinoid tumor of the small intestine, unspecified portion: Secondary | ICD-10-CM | POA: Diagnosis not present

## 2021-05-06 DIAGNOSIS — R143 Flatulence: Secondary | ICD-10-CM | POA: Diagnosis not present

## 2021-05-06 DIAGNOSIS — K912 Postsurgical malabsorption, not elsewhere classified: Secondary | ICD-10-CM | POA: Diagnosis not present

## 2021-05-06 DIAGNOSIS — R7989 Other specified abnormal findings of blood chemistry: Secondary | ICD-10-CM | POA: Diagnosis not present

## 2021-05-06 DIAGNOSIS — K529 Noninfective gastroenteritis and colitis, unspecified: Secondary | ICD-10-CM | POA: Diagnosis not present

## 2021-05-06 DIAGNOSIS — Z87891 Personal history of nicotine dependence: Secondary | ICD-10-CM | POA: Diagnosis not present

## 2021-05-06 DIAGNOSIS — Z950 Presence of cardiac pacemaker: Secondary | ICD-10-CM | POA: Diagnosis not present

## 2021-05-06 DIAGNOSIS — Z5111 Encounter for antineoplastic chemotherapy: Secondary | ICD-10-CM | POA: Diagnosis not present

## 2021-05-06 DIAGNOSIS — D3A098 Benign carcinoid tumors of other sites: Secondary | ICD-10-CM | POA: Diagnosis not present

## 2021-05-06 DIAGNOSIS — E340 Carcinoid syndrome: Secondary | ICD-10-CM | POA: Diagnosis not present

## 2021-05-06 DIAGNOSIS — R413 Other amnesia: Secondary | ICD-10-CM | POA: Diagnosis not present

## 2021-05-06 DIAGNOSIS — E876 Hypokalemia: Secondary | ICD-10-CM | POA: Diagnosis not present

## 2021-05-06 DIAGNOSIS — Z23 Encounter for immunization: Secondary | ICD-10-CM | POA: Diagnosis not present

## 2021-05-07 ENCOUNTER — Other Ambulatory Visit: Payer: Self-pay

## 2021-05-07 ENCOUNTER — Ambulatory Visit (INDEPENDENT_AMBULATORY_CARE_PROVIDER_SITE_OTHER): Payer: Medicare Other

## 2021-05-07 DIAGNOSIS — I495 Sick sinus syndrome: Secondary | ICD-10-CM | POA: Diagnosis not present

## 2021-05-07 DIAGNOSIS — I441 Atrioventricular block, second degree: Secondary | ICD-10-CM

## 2021-05-07 LAB — CUP PACEART INCLINIC DEVICE CHECK
Battery Remaining Longevity: 99 mo
Brady Statistic RA Percent Paced: 76 %
Brady Statistic RV Percent Paced: 2 %
Date Time Interrogation Session: 20221109133436
Implantable Lead Implant Date: 20221026
Implantable Lead Implant Date: 20221026
Implantable Lead Location: 753859
Implantable Lead Location: 753860
Implantable Lead Model: 377169
Implantable Lead Model: 377169
Implantable Lead Serial Number: 8000563684
Implantable Lead Serial Number: 8000606646
Implantable Pulse Generator Implant Date: 20221026
Lead Channel Impedance Value: 448 Ohm
Lead Channel Impedance Value: 585 Ohm
Lead Channel Pacing Threshold Amplitude: 1.1 V
Lead Channel Pacing Threshold Amplitude: 1.3 V
Lead Channel Pacing Threshold Pulse Width: 0.4 ms
Lead Channel Pacing Threshold Pulse Width: 0.4 ms
Lead Channel Sensing Intrinsic Amplitude: 5.8 mV
Lead Channel Sensing Intrinsic Amplitude: 8.7 mV
Lead Channel Setting Pacing Amplitude: 3 V
Lead Channel Setting Pacing Amplitude: 3 V
Lead Channel Setting Pacing Pulse Width: 0.4 ms
Pulse Gen Model: 407145
Pulse Gen Serial Number: 70247401

## 2021-05-07 NOTE — Progress Notes (Signed)
Wound check appointment s/p dual chamber PPM placement 10/26 for SSS.Marland Kitchen Steri-strips removed. Wound without redness or edema. Incision edges approximated, wound well healed. Normal device function. Thresholds, sensing, and impedances consistent with implant measurements. Device programmed at 3.0V for extra safety margin until 3 months. Histogram distribution appropriate for patient and level of activity. No mode switches or high ventricular rates noted. Patient educated about wound care, arm mobility, lifting restrictions. Patient enrolled in remote monitoring with next transmission scheduled 07/24/21. 91 day follow up with Dr. Curt Bears 07/28/21.

## 2021-05-07 NOTE — Patient Instructions (Signed)

## 2021-05-11 ENCOUNTER — Other Ambulatory Visit: Payer: Self-pay | Admitting: Internal Medicine

## 2021-05-11 DIAGNOSIS — K90829 Short bowel syndrome, unspecified: Secondary | ICD-10-CM | POA: Insufficient documentation

## 2021-05-11 DIAGNOSIS — C7A019 Malignant carcinoid tumor of the small intestine, unspecified portion: Secondary | ICD-10-CM

## 2021-05-11 DIAGNOSIS — K529 Noninfective gastroenteritis and colitis, unspecified: Secondary | ICD-10-CM

## 2021-05-11 DIAGNOSIS — K912 Postsurgical malabsorption, not elsewhere classified: Secondary | ICD-10-CM

## 2021-05-13 ENCOUNTER — Encounter (HOSPITAL_BASED_OUTPATIENT_CLINIC_OR_DEPARTMENT_OTHER): Payer: Self-pay

## 2021-05-14 ENCOUNTER — Other Ambulatory Visit: Payer: Self-pay | Admitting: Internal Medicine

## 2021-05-14 ENCOUNTER — Telehealth: Payer: Self-pay | Admitting: Cardiology

## 2021-05-14 DIAGNOSIS — K922 Gastrointestinal hemorrhage, unspecified: Secondary | ICD-10-CM

## 2021-05-14 DIAGNOSIS — C7A019 Malignant carcinoid tumor of the small intestine, unspecified portion: Secondary | ICD-10-CM

## 2021-05-14 DIAGNOSIS — K912 Postsurgical malabsorption, not elsewhere classified: Secondary | ICD-10-CM

## 2021-05-14 DIAGNOSIS — N1831 Chronic kidney disease, stage 3a: Secondary | ICD-10-CM

## 2021-05-14 DIAGNOSIS — K529 Noninfective gastroenteritis and colitis, unspecified: Secondary | ICD-10-CM

## 2021-05-14 NOTE — Telephone Encounter (Signed)
Pt is returning call from [facility] yesterday regarding pt's Mychart message

## 2021-05-14 NOTE — Telephone Encounter (Signed)
Pt called to report that his BP has still been running low on and off... he had a syncopal episode 3 days ago... he lossed consciousness at 1 am after sleeping in the recliner and got up to use the bathroom..his wife heard him hit the ground and he was unresponsive for several minutes and she did not call EMS he eventually got up with no apparent injury..   He has been drinking some water but knows he needs to drink more.. no new Oncology meds... they do not have any readings to give me but when low it is random times of the day mainly at night... 89/72, 89/71... HR has been in the 60's.   I have asked them to start keeping a BP log for Dr. Marlou Porch to review.   To be sure that he is hydrating well.   To not get up without sitting on the edge of the bed or recliner prior to getting up.   I will forward to Dr. Marlou Porch for review.  He is seeing Dr. Curt Bears for a pacer check 07/28/21.   Pt is taking his Midodrine 10mg  TID

## 2021-05-15 MED ORDER — FLUDROCORTISONE ACETATE 0.1 MG PO TABS: 0.1000 mg | ORAL_TABLET | Freq: Every day | ORAL | 3 refills | Status: DC

## 2021-05-15 NOTE — Telephone Encounter (Signed)
Pt is returning call.  

## 2021-05-15 NOTE — Telephone Encounter (Signed)
Patient's wife has an appointment here at [facility] today and she states they did not receive a return call yesterday.  Please advise.

## 2021-05-15 NOTE — Telephone Encounter (Signed)
Left message to call back  In addition to the midodrine 10 mg 3 times daily, hydration, lets add Florinef 0.1 mg daily.  Thanks for the update   Candee Furbish, MD

## 2021-05-15 NOTE — Telephone Encounter (Signed)
Gave patient and his wife recommendation from Dr. Marlou Porch. Send in new medication.  Verbalized agreement and understanding.

## 2021-05-27 ENCOUNTER — Telehealth: Payer: Self-pay

## 2021-05-27 ENCOUNTER — Institutional Professional Consult (permissible substitution): Payer: Medicare Other | Admitting: Cardiology

## 2021-05-27 NOTE — Telephone Encounter (Signed)
Biotronik alert received for AT event 05/25/21 at 11:06 pm lasting 1 hour 9 min. S/P implant 04/23/21 for SSS. Questionable history of AT/AF as noted in [facility]ot hx but note noted in Cardiology notes. No OAC prescribed. Will send to Dr. Curt Bears for review and recommendation.

## 2021-05-27 NOTE — Telephone Encounter (Signed)
Spoke with Dr. Curt Bears. Verbal order received to continue to monitor for now.   Successful telephone encounter to patient and wife to discuss. Patient states he was aware of his heart fluttering but denied additional cardiovascular symptoms including chest pain/pressure, dizziness as he was lying down, or a feeling of unease. Patient is more concerned about his daytime symptoms of "passing out feeling" and medication effectiveness (midodrine/florinef). ED precautions given. Patient appreciative of call. Provided patient with device clinic contact for additional questions or concerns that may arise.

## 2021-06-09 ENCOUNTER — Encounter: Payer: Self-pay | Admitting: Internal Medicine

## 2021-06-09 ENCOUNTER — Ambulatory Visit (INDEPENDENT_AMBULATORY_CARE_PROVIDER_SITE_OTHER): Payer: Medicare Other | Admitting: Internal Medicine

## 2021-06-09 ENCOUNTER — Other Ambulatory Visit: Payer: Self-pay

## 2021-06-09 VITALS — BP 140/92 | HR 76 | Temp 98.2°F | Ht 73.0 in | Wt 142.0 lb

## 2021-06-09 DIAGNOSIS — D500 Iron deficiency anemia secondary to blood loss (chronic): Secondary | ICD-10-CM | POA: Diagnosis not present

## 2021-06-09 DIAGNOSIS — K529 Noninfective gastroenteritis and colitis, unspecified: Secondary | ICD-10-CM

## 2021-06-09 DIAGNOSIS — E876 Hypokalemia: Secondary | ICD-10-CM | POA: Diagnosis not present

## 2021-06-09 DIAGNOSIS — C7A019 Malignant carcinoid tumor of the small intestine, unspecified portion: Secondary | ICD-10-CM

## 2021-06-09 DIAGNOSIS — I10 Essential (primary) hypertension: Secondary | ICD-10-CM | POA: Diagnosis not present

## 2021-06-09 DIAGNOSIS — K90829 Short bowel syndrome, unspecified: Secondary | ICD-10-CM

## 2021-06-09 DIAGNOSIS — K912 Postsurgical malabsorption, not elsewhere classified: Secondary | ICD-10-CM

## 2021-06-09 DIAGNOSIS — I951 Orthostatic hypotension: Secondary | ICD-10-CM

## 2021-06-09 DIAGNOSIS — N1831 Chronic kidney disease, stage 3a: Secondary | ICD-10-CM

## 2021-06-09 LAB — IBC + FERRITIN
Ferritin: 129.9 ng/mL (ref 22.0–322.0)
Iron: 68 ug/dL (ref 42–165)
Saturation Ratios: 29.4 % (ref 20.0–50.0)
TIBC: 231 ug/dL — ABNORMAL LOW (ref 250.0–450.0)
Transferrin: 165 mg/dL — ABNORMAL LOW (ref 212.0–360.0)

## 2021-06-09 LAB — CBC WITH DIFFERENTIAL/PLATELET
Basophils Absolute: 0 10*3/uL (ref 0.0–0.1)
Basophils Relative: 0.5 % (ref 0.0–3.0)
Eosinophils Absolute: 0.1 10*3/uL (ref 0.0–0.7)
Eosinophils Relative: 1 % (ref 0.0–5.0)
HCT: 32.6 % — ABNORMAL LOW (ref 39.0–52.0)
Hemoglobin: 11.2 g/dL — ABNORMAL LOW (ref 13.0–17.0)
Lymphocytes Relative: 30.1 % (ref 12.0–46.0)
Lymphs Abs: 2.3 10*3/uL (ref 0.7–4.0)
MCHC: 34.3 g/dL (ref 30.0–36.0)
MCV: 90.8 fl (ref 78.0–100.0)
Monocytes Absolute: 0.8 10*3/uL (ref 0.1–1.0)
Monocytes Relative: 10.1 % (ref 3.0–12.0)
Neutro Abs: 4.5 10*3/uL (ref 1.4–7.7)
Neutrophils Relative %: 58.3 % (ref 43.0–77.0)
Platelets: 198 10*3/uL (ref 150.0–400.0)
RBC: 3.59 Mil/uL — ABNORMAL LOW (ref 4.22–5.81)
RDW: 13.7 % (ref 11.5–15.5)
WBC: 7.8 10*3/uL (ref 4.0–10.5)

## 2021-06-09 LAB — BASIC METABOLIC PANEL
BUN: 35 mg/dL — ABNORMAL HIGH (ref 6–23)
CO2: 37 mEq/L — ABNORMAL HIGH (ref 19–32)
Calcium: 9.3 mg/dL (ref 8.4–10.5)
Chloride: 99 mEq/L (ref 96–112)
Creatinine, Ser: 1.98 mg/dL — ABNORMAL HIGH (ref 0.40–1.50)
GFR: 31.48 mL/min — ABNORMAL LOW (ref >60.00)
Glucose, Bld: 83 mg/dL (ref 70–99)
Potassium: 2.7 mEq/L — CL (ref 3.5–5.1)
Sodium: 146 mEq/L — ABNORMAL HIGH (ref 135–145)

## 2021-06-09 LAB — CORTISOL: Cortisol, Plasma: 9.1 ug/dL

## 2021-06-09 LAB — MAGNESIUM: Magnesium: 1.9 mg/dL (ref 1.5–2.5)

## 2021-06-09 NOTE — Progress Notes (Signed)
Subjective:  Patient ID: Alan [name], male    DOB: [birth year]  Age: 80 y.o. MRN: [id number]  CC: Anemia  This visit occurred during the SARS-CoV-2 public health emergency.  Safety protocols were in place, including screening questions prior to the visit, additional usage of staff PPE, and extensive cleaning of exam room while observing appropriate contact time as indicated for disinfecting solutions.    HPI Alan [name] presents for f/up -   He continues to have syncopal episodes with low blood pressure.  His wife is with him today and says she needs help taking care of him in the home.  His recent syncopal episode did not result in any trauma or injury.  He was triaged by EMS but not taken to the ED.  Outpatient Medications Prior to Visit  Medication Sig Dispense Refill   aspirin 81 MG EC tablet Take 81 mg by mouth daily. Swallow whole.     Ensure Plus (ENSURE PLUS) LIQD Take 237 mLs by mouth 2 (two) times daily between meals.     fludrocortisone (FLORINEF) 0.1 MG tablet Take 1 tablet (0.1 mg total) by mouth daily. 90 tablet 3   iron polysaccharides (NIFEREX) 150 MG capsule Take 1 capsule (150 mg total) by mouth 3 (three) times a week. (Patient taking differently: Take 150 mg by mouth daily.) 30 capsule 2   LANREOTIDE ACETATE [place] Inject 1 Dose into the skin every 30 (thirty) days.     midodrine (PROAMATINE) 10 MG tablet Take 1 tablet (10 mg total) by mouth 3 (three) times daily. 270 tablet 3   mirtazapine (REMERON) 7.5 MG tablet Take 7.5 mg by mouth daily.     Multiple Vitamin (MULTIVITAMIN WITH MINERALS) TABS Take 1 tablet by mouth daily.     Multiple Vitamins-Minerals (PRESERVISION AREDS 2 PO) Take 1 capsule by mouth daily.     multivitamin (ONE-A-DAY MEN'S) TABS tablet 1 tab     SYMBICORT 160-4.5 MCG/ACT inhaler Inhale 2 puffs into the lungs daily as needed (wheezing).     Vitamin D, Ergocalciferol, (DRISDOL) 1.25 MG (50000 UNIT) CAPS capsule Take 50,000 Units by mouth every 7  (seven) days.     No facility-administered medications prior to visit.    ROS Review of Systems  Constitutional:  Positive for fatigue and unexpected weight change (wt loss). Negative for chills, diaphoresis and fever.  HENT: Negative.    Eyes: Negative.   Respiratory:  Negative for cough, chest tightness, shortness of breath and wheezing.   Cardiovascular:  Negative for chest pain, palpitations and leg swelling.  Gastrointestinal:  Positive for diarrhea. Negative for abdominal pain, nausea and vomiting.  Endocrine: Negative.   Genitourinary: Negative.  Negative for difficulty urinating.  Musculoskeletal: Negative.   Skin: Negative.   Neurological:  Positive for dizziness, weakness and light-headedness. Negative for seizures.  Hematological:  Negative for adenopathy. Does not bruise/bleed easily.  Psychiatric/Behavioral: Negative.     Objective:  BP (!) 140/92 (BP Location: Left Arm, Patient Position: Sitting, Cuff Size: Normal)   Pulse 76   Temp 98.2 F (36.8 C) (Oral)   Ht 6\' 1"  (1.854 m)   Wt 142 lb (64.4 kg)   SpO2 95%   BMI 18.73 kg/m   BP Readings from Last 3 Encounters:  06/12/21 132/82  06/11/21 120/73  06/09/21 (!) 140/92    Wt Readings from Last 3 Encounters:  06/11/21 139 lb 3.2 oz (63.1 kg)  06/09/21 142 lb (64.4 kg)  04/23/21 141 lb 4.8 oz (64.1  kg)    Physical Exam Vitals reviewed.  Constitutional:      General: He is not in acute distress.    Appearance: He is underweight. He is ill-appearing. He is not toxic-appearing or diaphoretic.  HENT:     Nose: Nose normal.     Mouth/Throat:     Mouth: Mucous membranes are moist.  Eyes:     General: No scleral icterus.    Conjunctiva/sclera: Conjunctivae normal.  Cardiovascular:     Rate and Rhythm: Normal rate and regular rhythm.     Heart sounds: No murmur heard. Pulmonary:     Effort: Pulmonary effort is normal.     Breath sounds: No stridor. No wheezing, rhonchi or rales.  Abdominal:      General: Abdomen is scaphoid. Bowel sounds are normal. There is no distension.     Palpations: Abdomen is soft. There is no hepatomegaly or splenomegaly.  Musculoskeletal:        General: Normal range of motion.     Cervical back: Neck supple.  Lymphadenopathy:     Cervical: No cervical adenopathy.  Skin:    General: Skin is warm.     Coloration: Skin is pale.  Neurological:     General: No focal deficit present.     Mental Status: He is alert.  Psychiatric:        Mood and Affect: Mood normal.        Behavior: Behavior normal.    Lab Results  Component Value Date   WBC 9.6 06/11/2021   HGB 12.1 (L) 06/11/2021   HCT 34.7 (L) 06/11/2021   PLT 195 06/11/2021   GLUCOSE 119 (H) 06/12/2021   ALT 24 06/12/2021   AST 29 06/12/2021   NA 148 (H) 06/12/2021   K 2.7 (LL) 06/12/2021   CL 104 06/12/2021   CREATININE 1.86 (H) 06/12/2021   BUN 29 (H) 06/12/2021   CO2 31 06/12/2021   TSH 1.591 04/22/2021   INR 1.0 01/16/2021   HGBA1C 5.1 01/16/2021    CT HEAD WO CONTRAST (5MM)  Result Date: 04/22/2021 CLINICAL DATA:  Head trauma syncope EXAM: CT HEAD WITHOUT CONTRAST TECHNIQUE: Contiguous axial images were obtained from the base of the skull through the vertex without intravenous contrast. COMPARISON:  CT brain 01/16/2021 FINDINGS: Brain: No acute territorial infarction, hemorrhage or intracranial mass. Mild atrophy. Nonenlarged ventricles. Vascular: No hyperdense vessels. Mild carotid vascular calcification Skull: Normal. Negative for fracture or focal lesion. Sinuses/Orbits: No acute finding. Other: None IMPRESSION: Negative non contrasted CT appearance of the brain for age Electronically Signed   By: Donavan Foil M.D.   On: 04/22/2021 18:03   EP PPM/ICD IMPLANT  Result Date: 04/23/2021 SURGEON:  Will Meredith Leeds, MD   PREPROCEDURE DIAGNOSIS:  tachy/brady syndrome, orthostatic syncope   POSTPROCEDURE DIAGNOSIS:  tachy/brady syndrome, orthostatic syncope    PROCEDURES:  1.  Pacemaker implantation.   INTRODUCTION: Alan [name] is a 80 y.o. male  with a history of bradycardia who presents today for pacemaker implantation.  The patient reports intermittent episodes of syncope over the past few months.  No reversible causes have been identified.  The patient therefore presents today for pacemaker implantation.   DESCRIPTION OF PROCEDURE:  Informed written consent was obtained, and  the patient was brought to the electrophysiology lab in a fasting state.  The patient required no sedation for the procedure today.  The patients left chest was prepped and draped in the usual sterile fashion by the EP lab staff.  The skin overlying the left deltopectoral region was infiltrated with lidocaine for local analgesia.  A 4-cm incision was made over the left deltopectoral region.  A left subcutaneous pacemaker pocket was fashioned using a combination of sharp and blunt dissection. Electrocautery was required to assure hemostasis.  RA/RV Lead Placement: The left axillary vein was cannulated.  Through the left axillary vein, a Biotronik model [id number] (serial number [id number]) right atrial lead and a Biotronik model [id number] (serial number [id number]) right ventricular lead were advanced with fluoroscopic visualization into the right atrial appendage and right ventricular apex positions respectively.  Initial atrial lead P- waves measured 2.51mV with impedance of 495 ohms and a threshold of 1.5 V at 0.5 msec.  Right ventricular lead R-waves measured 13.9 mV with an impedance of 799 ohms and a threshold of 0.6 V at 0.5 msec.  Both leads were secured to the pectoralis fascia using #2-0 silk over the suture sleeves. Device Placement:  The leads were then connected to a Biotronik [place]ora 8 DR-T [id number] (serial number [id number]) pacemaker.  The pocket was irrigated with copious gentamicin solution.  The pacemaker was then placed into the pocket.  The pocket was then closed in 3 layers with 2.0 Vicryl suture  for the subcutaneous and 3.0 Vicryl suture subcuticular layers.  Steri-Strips and a sterile dressing were then applied. EBL<60ml. There were no early apparent complications.   CONCLUSIONS:  1. Successful implantation of a Biotronik Edora 8-DR-T dual-chamber pacemaker for symptomatic bradycardia  2. No early apparent complications.       Will Meredith Leeds, MD 04/23/2021 5:43 PM   DG Chest Port 1 View  Result Date: 04/22/2021 CLINICAL DATA:  Shortness of breath EXAM: PORTABLE CHEST 1 VIEW COMPARISON:  02/19/2021 FINDINGS: Heart is normal size. Aortic atherosclerosis. No confluent airspace opacities or effusions. No acute bony abnormality. IMPRESSION: No active cardiopulmonary disease. Electronically Signed   By: Rolm Baptise M.D.   On: 04/22/2021 17:28    Assessment & Plan:   Tin was seen today for anemia.  Diagnoses and all orders for this visit:  Essential hypertension- His blood pressure is adequately well controlled. -     Basic metabolic panel; Future -     Magnesium; Future -     Cortisol; Future -     Cortisol -     Magnesium -     Basic metabolic panel -     Aldosterone + renin activity w/ ratio; Future  Hypokalemia due to excessive gastrointestinal loss of potassium- I have asked him to be more compliant with the potassium supplement. -     Basic metabolic panel; Future -     Magnesium; Future -     Cortisol; Future -     Cortisol -     Magnesium -     Basic metabolic panel -     Aldosterone + renin activity w/ ratio; Future  Iron deficiency anemia due to chronic blood loss- His iron level is normal but his H&H remains low.  There is likely a component of anemia of chronic disease. -     CBC with Differential/Platelet; Future -     IBC + Ferritin; Future -     IBC + Ferritin -     CBC with Differential/Platelet  Orthostatic hypotension- His blood pressure and sodium are too high for an increase in the fludrocortisone dosage. -     Cortisol; Future -     Cortisol -      Ambulatory  referral to [place] Referral to [place]  Malignant carcinoid tumor of small intestine, unspecified location [facility])- He will see hematology next week about this. -     Ambulatory referral to [place] Referral to [place]  Chronic diarrhea -     Ambulatory referral to [place] gut syndrome -     Ambulatory referral to [place] -     AMB Referral to [place]  Chronic kidney disease, stage 3a ([place]) -     Ambulatory referral to [place] -     AMB Referral to [place]  I am having Josie Saunders. Sheets maintain his multivitamin with minerals, Vitamin D (Ergocalciferol), Multiple Vitamins-Minerals (PRESERVISION AREDS 2 PO), Symbicort, mirtazapine, aspirin, LANREOTIDE ACETATE [place], iron polysaccharides, midodrine, Ensure Plus, multivitamin, and fludrocortisone.  No orders of the defined types were placed in this encounter.    Follow-up: Return in about 3 months (around 09/07/2021).  Scarlette Calico, MD

## 2021-06-09 NOTE — Patient Instructions (Signed)
Iron Deficiency Anemia, Adult Iron deficiency anemia is a condition in which the concentration of red blood cells or hemoglobin in the blood is below normal because of too little iron. Hemoglobin is a substance in red blood cells that carries oxygen to the body's tissues. When the concentration of red blood cells or hemoglobin is too low, not enough oxygen reaches these tissues. Iron deficiency anemia is usually long-lasting, and it develops over time. It may or may not cause symptoms. It is a common type of anemia. What are the causes? This condition may be caused by: Not enough iron in the diet. Abnormal absorption in the gut. Increased need for iron because of pregnancy or heavy menstrual periods, for females. Cancers of the gastrointestinal system, such as colon cancer. Blood loss caused by bleeding in the intestine. This may be from a gastrointestinal condition like Crohn's disease. Frequent blood draws, such as from blood donation. What increases the risk? The following factors may make you more likely to develop this condition: Being pregnant. Being a teenage girl going through a growth spurt. What are the signs or symptoms? Symptoms of this condition may include: Pale skin, lips, and nail beds. Weakness, dizziness, and getting tired easily. Headache. Shortness of breath when moving or exercising. Cold hands and feet. Fast or irregular heartbeat. Irritability or rapid breathing. These are more common in severe anemia. Mild anemia may not cause any symptoms. How is this diagnosed? This condition is diagnosed based on: Your medical history. A physical exam. Blood tests. You may have additional tests to find the underlying cause of your anemia, such as: Testing for blood in the stool (fecal occult blood test). A procedure to see inside your colon and rectum (colonoscopy). A procedure to see inside your esophagus and stomach (endoscopy). A test in which cells are removed from  bone marrow (bone marrow aspiration) or fluid is removed from the bone marrow to be examined. This is rarely needed. How is this treated? This condition is treated by correcting the cause of your iron deficiency. Treatment may involve: Adding iron-rich foods to your diet. Taking iron supplements. If you are pregnant or breastfeeding, you may need to take extra iron because your normal diet usually does not provide the amount of iron that you need. Increasing vitamin C intake. Vitamin C helps your body absorb iron. Your health care provider may recommend that you take iron supplements along with a glass of orange juice or a vitamin C supplement. Medicines to make heavy menstrual flow lighter. Surgery. You may need repeat blood tests to determine whether treatment is working. If the treatment does not seem to be working, you may need more tests. Follow these instructions at home: Medicines Take over-the-counter and prescription medicines only as told by your health care provider. This includes iron supplements and vitamins. For the best iron absorption, you should take iron supplements when your stomach is empty. If you cannot tolerate them on an empty stomach, you may need to take them with food. Do not drink milk or take antacids at the same time as your iron supplements. Milk and antacids may interfere with iron absorption. Iron supplements may turn stool (feces) a darker color and it may appear black. If you cannot tolerate taking iron supplements by mouth, talk with your health care provider about taking them through an IV or through an injection into a muscle. Eating and drinking  Talk with your health care provider before changing your diet. He or she may recommend   that you eat foods that contain a lot of iron, such as: Liver. Low-fat (lean) beef. Breads and cereals that have iron added to them (are fortified). Eggs. Dried fruit. Dark green, leafy vegetables. To help your body use the  iron from iron-rich foods, eat those foods at the same time as fresh fruits and vegetables that are high in vitamin C. Foods that are high in vitamin C include: Oranges. Peppers. Tomatoes. Mangoes. Drink enough fluid to keep your urine pale yellow. Managing constipation If you are taking an iron supplement, it may cause constipation. To prevent or treat constipation, you may need to: Take over-the-counter or prescription medicines. Eat foods that are high in fiber, such as beans, whole grains, and fresh fruits and vegetables. Limit foods that are high in fat and processed sugars, such as fried or sweet foods. General instructions Return to your normal activities as told by your health care provider. Ask your health care provider what activities are safe for you. Practice good hygiene. Anemia can make you more prone to illness and infection. Keep all follow-up visits as told by your health care provider. This is important. Contact a health care provider if you: Feel nauseous or you vomit. Feel weak. Have unexplained sweating. Develop symptoms of constipation, such as: Having fewer than three bowel movements a week. Straining to have a bowel movement. Having stools that are hard, dry, or larger than normal. Feeling full or bloated. Pain in the lower abdomen. Not feeling relief after having a bowel movement. Get help right away if you: Faint. If this happens, do not drive yourself to the hospital. Have chest pain. Have shortness of breath that: Is severe. Gets worse with physical activity. Have an irregular or rapid heartbeat. Become light-headed when getting up from a sitting or lying down position. These symptoms may represent a serious problem that is an emergency. Do not wait to see if the symptoms will go away. Get medical help right away. Call your local emergency services (911 in the [place] Do not drive yourself to the hospital. Summary Iron deficiency anemia is a condition in  which the concentration of red blood cells or hemoglobin in the blood is below normal because of too little iron. This condition is treated by correcting the cause of your iron deficiency. Take over-the-counter and prescription medicines only as told by your health care provider. This includes iron supplements and vitamins. To help your body use the iron from iron-rich foods, eat those foods at the same time as fresh fruits and vegetables that are high in vitamin C. Get help right away if you have shortness of breath that gets worse with physical activity. This information is not intended to replace advice given to you by your health care provider. Make sure you discuss any questions you have with your health care provider. Document Revised: 02/21/2019 Document Reviewed: 02/21/2019 Elsevier Patient Education  2022 Elsevier Inc.  

## 2021-06-10 ENCOUNTER — Encounter: Payer: Self-pay | Admitting: Internal Medicine

## 2021-06-10 ENCOUNTER — Telehealth: Payer: Self-pay | Admitting: Internal Medicine

## 2021-06-10 ENCOUNTER — Other Ambulatory Visit: Payer: Self-pay

## 2021-06-10 DIAGNOSIS — D500 Iron deficiency anemia secondary to blood loss (chronic): Secondary | ICD-10-CM

## 2021-06-10 DIAGNOSIS — C7A019 Malignant carcinoid tumor of the small intestine, unspecified portion: Secondary | ICD-10-CM

## 2021-06-10 NOTE — Telephone Encounter (Signed)
Caller connected to [facility] 12.12.2022.    Caller states she has a lab results (critical)  States Pt potassium level is low. 2.7 12/12 @ 3:44pm.

## 2021-06-11 ENCOUNTER — Inpatient Hospital Stay: Payer: Medicare Other | Attending: Hematology | Admitting: Hematology

## 2021-06-11 ENCOUNTER — Inpatient Hospital Stay: Payer: Medicare Other

## 2021-06-11 ENCOUNTER — Other Ambulatory Visit: Payer: Self-pay

## 2021-06-11 VITALS — BP 120/73 | HR 76 | Temp 98.1°F | Resp 17 | Ht 73.0 in | Wt 139.2 lb

## 2021-06-11 DIAGNOSIS — D500 Iron deficiency anemia secondary to blood loss (chronic): Secondary | ICD-10-CM | POA: Diagnosis not present

## 2021-06-11 DIAGNOSIS — C7A019 Malignant carcinoid tumor of the small intestine, unspecified portion: Secondary | ICD-10-CM | POA: Diagnosis not present

## 2021-06-11 DIAGNOSIS — C7A098 Malignant carcinoid tumors of other sites: Secondary | ICD-10-CM | POA: Insufficient documentation

## 2021-06-11 DIAGNOSIS — E876 Hypokalemia: Secondary | ICD-10-CM

## 2021-06-11 DIAGNOSIS — R197 Diarrhea, unspecified: Secondary | ICD-10-CM | POA: Diagnosis not present

## 2021-06-11 DIAGNOSIS — D509 Iron deficiency anemia, unspecified: Secondary | ICD-10-CM | POA: Diagnosis not present

## 2021-06-11 LAB — CMP (CANCER CENTER ONLY)
ALT: 26 U/L (ref 0–44)
AST: 25 U/L (ref 15–41)
Albumin: 4 g/dL (ref 3.5–5.0)
Alkaline Phosphatase: 74 U/L (ref 38–126)
Anion gap: 13 (ref 5–15)
BUN: 27 mg/dL — ABNORMAL HIGH (ref 8–23)
CO2: 33 mmol/L — ABNORMAL HIGH (ref 22–32)
Calcium: 9.1 mg/dL (ref 8.9–10.3)
Chloride: 100 mmol/L (ref 98–111)
Creatinine: 2.01 mg/dL — ABNORMAL HIGH (ref 0.61–1.24)
GFR, Estimated: 33 mL/min — ABNORMAL LOW (ref >60)
Glucose, Bld: 175 mg/dL — ABNORMAL HIGH (ref 70–99)
Potassium: 2.3 mmol/L — CL (ref 3.5–5.1)
Sodium: 146 mmol/L — ABNORMAL HIGH (ref 135–145)
Total Bilirubin: 1.2 mg/dL (ref 0.3–1.2)
Total Protein: 7.5 g/dL (ref 6.5–8.1)

## 2021-06-11 LAB — CBC WITH DIFFERENTIAL (CANCER CENTER ONLY)
Abs Immature Granulocytes: 0.03 10*3/uL (ref 0.00–0.07)
Basophils Absolute: 0 10*3/uL (ref 0.0–0.1)
Basophils Relative: 0 %
Eosinophils Absolute: 0.1 10*3/uL (ref 0.0–0.5)
Eosinophils Relative: 2 %
HCT: 34.7 % — ABNORMAL LOW (ref 39.0–52.0)
Hemoglobin: 12.1 g/dL — ABNORMAL LOW (ref 13.0–17.0)
Immature Granulocytes: 0 %
Lymphocytes Relative: 18 %
Lymphs Abs: 1.7 10*3/uL (ref 0.7–4.0)
MCH: 31.1 pg (ref 26.0–34.0)
MCHC: 34.9 g/dL (ref 30.0–36.0)
MCV: 89.2 fL (ref 80.0–100.0)
Monocytes Absolute: 0.8 10*3/uL (ref 0.1–1.0)
Monocytes Relative: 8 %
Neutro Abs: 6.9 10*3/uL (ref 1.7–7.7)
Neutrophils Relative %: 72 %
Platelet Count: 195 10*3/uL (ref 150–400)
RBC: 3.89 MIL/uL — ABNORMAL LOW (ref 4.22–5.81)
RDW: 13.2 % (ref 11.5–15.5)
WBC Count: 9.6 10*3/uL (ref 4.0–10.5)
nRBC: 0 % (ref 0.0–0.2)

## 2021-06-11 LAB — LACTATE DEHYDROGENASE: LDH: 233 U/L — ABNORMAL HIGH (ref 98–192)

## 2021-06-11 MED ORDER — POTASSIUM CHLORIDE CRYS ER 20 MEQ PO TBCR: EXTENDED_RELEASE_TABLET | ORAL | 1 refills | Status: DC

## 2021-06-11 MED ORDER — POTASSIUM CHLORIDE 20 MEQ PO PACK
60.0000 meq | PACK | Freq: Once | ORAL | Status: AC
  Administered 2021-06-11: 17:00:00 60 meq via ORAL
  Filled 2021-06-11: qty 3

## 2021-06-12 ENCOUNTER — Inpatient Hospital Stay: Payer: Medicare Other

## 2021-06-12 ENCOUNTER — Other Ambulatory Visit: Payer: Self-pay

## 2021-06-12 ENCOUNTER — Telehealth: Payer: Self-pay

## 2021-06-12 ENCOUNTER — Telehealth: Payer: Self-pay | Admitting: *Deleted

## 2021-06-12 DIAGNOSIS — C9000 Multiple myeloma not having achieved remission: Secondary | ICD-10-CM

## 2021-06-12 DIAGNOSIS — D500 Iron deficiency anemia secondary to blood loss (chronic): Secondary | ICD-10-CM

## 2021-06-12 DIAGNOSIS — C7A019 Malignant carcinoid tumor of the small intestine, unspecified portion: Secondary | ICD-10-CM

## 2021-06-12 DIAGNOSIS — C7A098 Malignant carcinoid tumors of other sites: Secondary | ICD-10-CM | POA: Diagnosis not present

## 2021-06-12 DIAGNOSIS — E876 Hypokalemia: Secondary | ICD-10-CM

## 2021-06-12 LAB — CMP (CANCER CENTER ONLY)
ALT: 24 U/L (ref 0–44)
AST: 29 U/L (ref 15–41)
Albumin: 3.5 g/dL (ref 3.5–5.0)
Alkaline Phosphatase: 64 U/L (ref 38–126)
Anion gap: 13 (ref 5–15)
BUN: 29 mg/dL — ABNORMAL HIGH (ref 8–23)
CO2: 31 mmol/L (ref 22–32)
Calcium: 8.8 mg/dL — ABNORMAL LOW (ref 8.9–10.3)
Chloride: 104 mmol/L (ref 98–111)
Creatinine: 1.86 mg/dL — ABNORMAL HIGH (ref 0.61–1.24)
GFR, Estimated: 36 mL/min — ABNORMAL LOW (ref >60)
Glucose, Bld: 119 mg/dL — ABNORMAL HIGH (ref 70–99)
Potassium: 2.7 mmol/L — CL (ref 3.5–5.1)
Sodium: 148 mmol/L — ABNORMAL HIGH (ref 135–145)
Total Bilirubin: 0.9 mg/dL (ref 0.3–1.2)
Total Protein: 6.8 g/dL (ref 6.5–8.1)

## 2021-06-12 LAB — PHOSPHORUS: Phosphorus: 3.2 mg/dL (ref 2.5–4.6)

## 2021-06-12 LAB — MAGNESIUM: Magnesium: 1.8 mg/dL (ref 1.7–2.4)

## 2021-06-12 MED ORDER — MAGNESIUM SULFATE 2 GM/50ML IV SOLN
2.0000 g | Freq: Once | INTRAVENOUS | Status: AC
  Administered 2021-06-12: 2 g via INTRAVENOUS
  Filled 2021-06-12: qty 50

## 2021-06-12 MED ORDER — SODIUM CHLORIDE 0.9 % IV SOLN: INTRAVENOUS | Status: DC

## 2021-06-12 MED ORDER — POTASSIUM CHLORIDE IN NACL 20-0.9 MEQ/L-% IV SOLN
Freq: Once | INTRAVENOUS | Status: DC
  Filled 2021-06-12: qty 1000

## 2021-06-12 MED ORDER — POTASSIUM CHLORIDE IN NACL 20-0.9 MEQ/L-% IV SOLN
Freq: Once | INTRAVENOUS | Status: AC
  Filled 2021-06-12: qty 1000

## 2021-06-12 MED ORDER — SODIUM CHLORIDE 0.9 % IV SOLN: Freq: Once | INTRAVENOUS | Status: DC

## 2021-06-12 MED ORDER — MAGNESIUM SULFATE 2 GM/50ML IV SOLN: 2.0000 g | Freq: Once | INTRAVENOUS | Status: DC

## 2021-06-12 NOTE — Telephone Encounter (Signed)
CRITICAL CARE Performed by: Rondel Baton  CRITICAL VALUE STICKER  CRITICAL VALUE: 2.7  RECEIVER (on-site recipient of call):  Rondel Baton, LPN  [place] NOTIFIED: 06/12/21 @ 08:58  MESSENGER (representative from lab): Ulice Dash  MD NOTIFIED: Yes  TIME OF NOTIFICATION:09:00  RESPONSE: pt is in infusion

## 2021-06-12 NOTE — Progress Notes (Signed)
Patient tolerated 21mEq K+ and 2g Mg infusion well today, no concerns voiced. Patient discharged. Stable.

## 2021-06-12 NOTE — Patient Instructions (Signed)
Potassium Chloride Injection What is this medication? POTASSIUM CHLORIDE (poe TASS i um KLOOR ide) prevents and treats low levels of potassium in your body. Potassium plays an important role in maintaining the health of your kidneys, heart, muscles, and nervous system. This medicine may be used for other purposes; ask your health care provider or pharmacist if you have questions. COMMON BRAND NAME(S): PROAMP What should I tell my care team before I take this medication? They need to know if you have any of these conditions: Addison disease Dehydration Diabetes (high blood sugar) Heart disease High levels of potassium in the blood Irregular heartbeat or rhythm Kidney disease Large areas of burned skin An unusual or allergic reaction to potassium, other medications, foods, dyes, or preservatives Pregnant or trying to get pregnant Breast-feeding How should I use this medication? This medication is injected into a vein. It is given in a hospital or clinic setting. Talk to your care team about the use of this medication in children. Special care may be needed. Overdosage: If you think you have taken too much of this medicine contact a poison control center or emergency room at once. NOTE: This medicine is only for you. Do not share this medicine with others. What if I miss a dose? This does not apply. This medication is not for regular use. What may interact with this medication? Do not take this medication with any of the following: Certain diuretics such as spironolactone, triamterene Eplerenone Sodium polystyrene sulfonate This medication may also interact with the following: Certain medications for blood pressure or heart disease like lisinopril, losartan, quinapril, valsartan Medications that lower your chance of fighting infection such as cyclosporine, tacrolimus NSAIDs, medications for pain and inflammation, like ibuprofen or naproxen Other potassium supplements Salt  substitutes This list may not describe all possible interactions. Give your health care provider a list of all the medicines, herbs, non-prescription drugs, or dietary supplements you use. Also tell them if you smoke, drink alcohol, or use illegal drugs. Some items may interact with your medicine. What should I watch for while using this medication? Visit your care team for regular checks on your progress. Tell your care team if your symptoms do not start to get better or if they get worse. You may need blood work while you are taking this medication. Avoid salt substitutes unless you are told otherwise by your care team. What side effects may I notice from receiving this medication? Side effects that you should report to your care team as soon as possible: Allergic reactions--skin rash, itching, hives, swelling of the face, lips, tongue, or throat High potassium level--muscle weakness, fast or irregular heartbeat Side effects that usually do not require medical attention (report to your care team if they continue or are bothersome): Diarrhea Nausea Stomach pain Vomiting This list may not describe all possible side effects. Call your doctor for medical advice about side effects. You may report side effects to FDA at 1-800-FDA-1088. Where should I keep my medication? This medication is given in a hospital or clinic. It will not be stored at home. Magnesium Sulfate Injection What is this medication? MAGNESIUM SULFATE (mag NEE zee um SUL fate) prevents and treats low levels of magnesium in your body. It may also be used to prevent and treat seizures during pregnancy in people with high blood pressure disorders, such as preeclampsia or eclampsia. Magnesium plays an important role in maintaining the health of your muscles and nervous system. This medicine may be used for other purposes;  ask your health care provider or pharmacist if you have questions. What should I tell my care team before I take  this medication? They need to know if you have any of these conditions: Heart disease History of irregular heart beat Kidney disease An unusual or allergic reaction to magnesium sulfate, medications, foods, dyes, or preservatives Pregnant or trying to get pregnant Breast-feeding How should I use this medication? This medication is for infusion into a vein. It is given in a hospital or clinic setting. Talk to your care team about the use of this medication in children. While this medication may be prescribed for selected conditions, precautions do apply. Overdosage: If you think you have taken too much of this medicine contact a poison control center or emergency room at once. NOTE: This medicine is only for you. Do not share this medicine with others. What if I miss a dose? This does not apply. What may interact with this medication? Certain medications for anxiety or sleep Certain medications for seizures like phenobarbital Digoxin Medications that relax muscles for surgery Narcotic medications for pain This list may not describe all possible interactions. Give your health care provider a list of all the medicines, herbs, non-prescription drugs, or dietary supplements you use. Also tell them if you smoke, drink alcohol, or use illegal drugs. Some items may interact with your medicine. What should I watch for while using this medication? Your condition will be monitored carefully while you are receiving this medication. You may need blood work done while you are receiving this medication. What side effects may I notice from receiving this medication? Side effects that you should report to your care team as soon as possible: Allergic reactions--skin rash, itching, hives, swelling of the face, lips, tongue, or throat High magnesium level--confusion, drowsiness, facial flushing, redness, sweating, muscle weakness, fast or irregular heartbeat, trouble breathing Low blood pressure--dizziness,  feeling faint or lightheaded, blurry vision Side effects that usually do not require medical attention (report to your care team if they continue or are bothersome): Headache Nausea This list may not describe all possible side effects. Call your doctor for medical advice about side effects. You may report side effects to FDA at 1-800-FDA-1088. Where should I keep my medication? This medication is given in a hospital or clinic and will not be stored at home. NOTE: This sheet is a summary. It may not cover all possible information. If you have questions about this medicine, talk to your doctor, pharmacist, or health care provider.  2022 Elsevier/Gold Standard (2020-08-29 00:00:00) is medicine, talk to your doctor, pharmacist, or health care provider.

## 2021-06-12 NOTE — Telephone Encounter (Signed)
Chart opened in error

## 2021-06-13 ENCOUNTER — Telehealth: Payer: Self-pay | Admitting: Hematology

## 2021-06-13 ENCOUNTER — Telehealth: Payer: Self-pay | Admitting: *Deleted

## 2021-06-13 ENCOUNTER — Telehealth: Payer: Self-pay | Admitting: Internal Medicine

## 2021-06-13 NOTE — Chronic Care Management (AMB) (Signed)
Chronic Care Management   Note  06/13/2021 Name: Alan [name] MRN: [id number] DOB: Jun 18, 1941  Alan [name] is a 80 y.o. year old male who is a primary care patient of [name], MD. I reached out to [place] by phone today in response to a referral sent by Alan [name]'s PCP.  Alan [name] was given information about Chronic Care Management services today including:  CCM service includes personalized support from designated clinical staff supervised by his physician, including individualized plan of care and coordination with other care providers 24/7 contact phone numbers for assistance for urgent and routine care needs. Service will only be billed when office clinical staff spend 20 minutes or more in a month to coordinate care. Only one practitioner may furnish and bill the service in a calendar month. The patient may stop CCM services at any time (effective at the end of the month) by phone call to the office staff. The patient is responsible for co-pay (up to 20% after annual deductible is met) if co-pay is required by the individual health plan.   Alan [name],[name] spouse DPR on file verbally agreed to assistance and services provided by embedded care coordination/care management team today.  Follow up plan: Telephone appointment with care management team member scheduled for:07/02/21  [facility]  Direct Dial: [phone]

## 2021-06-13 NOTE — Telephone Encounter (Signed)
Called number listen below, LVM to discuss.  [place] for [employer]. Magnesium is not on the current med list.

## 2021-06-13 NOTE — Telephone Encounter (Signed)
Requesting skilled for 1x week- 5 weeks. Lso, requesting eval for ST, OT, and PT. States pt has been taking Magnesium 400mg  tab/ daily. Not sure who prescribed it previously. Vitamin over the counter. Does he supposed to be taking it?     Callback #- [phone]

## 2021-06-13 NOTE — Telephone Encounter (Signed)
Left message with follow-up appointments per 12/14 los. °

## 2021-06-16 ENCOUNTER — Inpatient Hospital Stay: Payer: Medicare Other

## 2021-06-16 ENCOUNTER — Other Ambulatory Visit: Payer: Self-pay

## 2021-06-16 ENCOUNTER — Other Ambulatory Visit: Payer: Self-pay | Admitting: Hematology and Oncology

## 2021-06-16 VITALS — BP 142/92 | HR 93 | Temp 97.9°F | Resp 18

## 2021-06-16 DIAGNOSIS — C7A098 Malignant carcinoid tumors of other sites: Secondary | ICD-10-CM | POA: Diagnosis not present

## 2021-06-16 DIAGNOSIS — C9000 Multiple myeloma not having achieved remission: Secondary | ICD-10-CM

## 2021-06-16 DIAGNOSIS — C7A019 Malignant carcinoid tumor of the small intestine, unspecified portion: Secondary | ICD-10-CM

## 2021-06-16 LAB — CMP (CANCER CENTER ONLY)
ALT: 29 U/L (ref 0–44)
AST: 30 U/L (ref 15–41)
Albumin: 4.1 g/dL (ref 3.5–5.0)
Alkaline Phosphatase: 71 U/L (ref 38–126)
Anion gap: 11 (ref 5–15)
BUN: 29 mg/dL — ABNORMAL HIGH (ref 8–23)
CO2: 28 mmol/L (ref 22–32)
Calcium: 9.4 mg/dL (ref 8.9–10.3)
Chloride: 107 mmol/L (ref 98–111)
Creatinine: 1.89 mg/dL — ABNORMAL HIGH (ref 0.61–1.24)
GFR, Estimated: 36 mL/min — ABNORMAL LOW (ref >60)
Glucose, Bld: 109 mg/dL — ABNORMAL HIGH (ref 70–99)
Potassium: 4.1 mmol/L (ref 3.5–5.1)
Sodium: 146 mmol/L — ABNORMAL HIGH (ref 135–145)
Total Bilirubin: 0.8 mg/dL (ref 0.3–1.2)
Total Protein: 7.7 g/dL (ref 6.5–8.1)

## 2021-06-16 LAB — CBC WITH DIFFERENTIAL (CANCER CENTER ONLY)
Abs Immature Granulocytes: 0.03 10*3/uL (ref 0.00–0.07)
Basophils Absolute: 0 10*3/uL (ref 0.0–0.1)
Basophils Relative: 0 %
Eosinophils Absolute: 0.2 10*3/uL (ref 0.0–0.5)
Eosinophils Relative: 2 %
HCT: 35.6 % — ABNORMAL LOW (ref 39.0–52.0)
Hemoglobin: 12.3 g/dL — ABNORMAL LOW (ref 13.0–17.0)
Immature Granulocytes: 0 %
Lymphocytes Relative: 25 %
Lymphs Abs: 2.8 10*3/uL (ref 0.7–4.0)
MCH: 31.3 pg (ref 26.0–34.0)
MCHC: 34.6 g/dL (ref 30.0–36.0)
MCV: 90.6 fL (ref 80.0–100.0)
Monocytes Absolute: 0.8 10*3/uL (ref 0.1–1.0)
Monocytes Relative: 7 %
Neutro Abs: 7.4 10*3/uL (ref 1.7–7.7)
Neutrophils Relative %: 66 %
Platelet Count: 191 10*3/uL (ref 150–400)
RBC: 3.93 MIL/uL — ABNORMAL LOW (ref 4.22–5.81)
RDW: 13.5 % (ref 11.5–15.5)
WBC Count: 11.2 10*3/uL — ABNORMAL HIGH (ref 4.0–10.5)
nRBC: 0 % (ref 0.0–0.2)

## 2021-06-16 MED ORDER — LANREOTIDE ACETATE 120 MG/0.5ML ~~LOC~~ SOLN
120.0000 mg | Freq: Once | SUBCUTANEOUS | Status: AC
  Administered 2021-06-16: 10:00:00 120 mg via SUBCUTANEOUS
  Filled 2021-06-16: qty 120

## 2021-06-16 NOTE — Patient Instructions (Signed)
Lanreotide injection °What is this medication? °LANREOTIDE (lan REE oh tide) is used to reduce blood levels of growth hormone in patients with a condition called acromegaly. It also works to slow or stop tumor growth in patients with neuroendocrine tumors and treat carcinoid syndrome. °This medicine may be used for other purposes; ask your health care provider or pharmacist if you have questions. °COMMON BRAND NAME(S): Somatuline [place] °What should I tell my care team before I take this medication? °They need to know if you have any of these conditions: °diabetes °gallbladder disease °heart disease °kidney disease °liver disease °thyroid disease °an unusual or allergic reaction to lanreotide, other medicines, foods, dyes, or preservatives °pregnant or trying to get pregnant °breast-feeding °How should I use this medication? °This medicine is for injection under the skin. It is given by a health care professional in a hospital or clinic setting. °Contact your pediatrician or health care professional regarding the use of this medicine in children. Special care may be needed. °Overdosage: If you think you have taken too much of this medicine contact a poison control center or emergency room at once. °NOTE: This medicine is only for you. Do not share this medicine with others. °What if I miss a dose? °It is important not to miss your dose. Call your doctor or health care professional if you are unable to keep an appointment. °What may interact with this medication? °This medicine may interact with the following medications: °bromocriptine °cyclosporine °certain medicines for blood pressure, heart disease, irregular heart beat °certain medicines for diabetes °quinidine °terfenadine °This list may not describe all possible interactions. Give your health care provider a list of all the medicines, herbs, non-prescription drugs, or dietary supplements you use. Also tell them if you smoke, drink alcohol, or use illegal drugs.  Some items may interact with your medicine. °What should I watch for while using this medication? °Tell your doctor or healthcare professional if your symptoms do not start to get better or if they get worse. °Visit your doctor or health care professional for regular checks on your progress. Your condition will be monitored carefully while you are receiving this medicine. °This medicine may increase blood sugar. Ask your healthcare provider if changes in diet or medicines are needed if you have diabetes. °You may need blood work done while you are taking this medicine. °Women should inform their doctor if they wish to become pregnant or think they might be pregnant. There is a potential for serious side effects to an unborn child. Talk to your health care professional or pharmacist for more information. Do not breast-feed an infant while taking this medicine or for 6 months after stopping it. °This medicine has caused ovarian failure in some women. This medicine may interfere with the ability to have a child. Talk with your doctor or health care professional if you are concerned about your fertility. °What side effects may I notice from receiving this medication? °Side effects that you should report to your doctor or health care professional as soon as possible: °allergic reactions like skin rash, itching or hives, swelling of the face, lips, or tongue °increased blood pressure °severe stomach pain °signs and symptoms of hgh blood sugar such as being more thirsty or hungry or having to urinate more than normal. You may also feel very tired or have blurry vision. °signs and symptoms of low blood sugar such as feeling anxious; confusion; dizziness; increased hunger; unusually weak or tired; sweating; shakiness; cold; irritable; headache; blurred vision; fast   heartbeat; loss of consciousness °unusually slow heartbeat °Side effects that usually do not require medical attention (report to your doctor or health care  professional if they continue or are bothersome): °constipation °diarrhea °dizziness °headache °muscle pain °muscle spasms °nausea °pain, redness, or irritation at site where injected °This list may not describe all possible side effects. Call your doctor for medical advice about side effects. You may report side effects to FDA at 1-800-FDA-1088. °Where should I keep my medication? °This drug is given in a hospital or clinic and will not be stored at home. °NOTE: This sheet is a summary. It may not cover all possible information. If you have questions about this medicine, talk to your doctor, pharmacist, or health care provider. °© 2022 Elsevier/Gold Standard (2018-05-04 00:00:00) ° °

## 2021-06-20 ENCOUNTER — Telehealth: Payer: Self-pay | Admitting: Internal Medicine

## 2021-06-25 ENCOUNTER — Other Ambulatory Visit: Payer: Self-pay | Admitting: *Deleted

## 2021-06-25 DIAGNOSIS — C7A019 Malignant carcinoid tumor of the small intestine, unspecified portion: Secondary | ICD-10-CM

## 2021-06-26 ENCOUNTER — Inpatient Hospital Stay (HOSPITAL_BASED_OUTPATIENT_CLINIC_OR_DEPARTMENT_OTHER): Payer: Medicare Other | Admitting: Hematology

## 2021-06-26 ENCOUNTER — Other Ambulatory Visit: Payer: Self-pay

## 2021-06-26 ENCOUNTER — Inpatient Hospital Stay: Payer: Medicare Other

## 2021-06-26 VITALS — BP 136/86 | HR 80 | Temp 97.9°F | Resp 15 | Wt 141.0 lb

## 2021-06-26 DIAGNOSIS — C7A098 Malignant carcinoid tumors of other sites: Secondary | ICD-10-CM | POA: Diagnosis not present

## 2021-06-26 DIAGNOSIS — C7A019 Malignant carcinoid tumor of the small intestine, unspecified portion: Secondary | ICD-10-CM | POA: Diagnosis not present

## 2021-06-26 DIAGNOSIS — E876 Hypokalemia: Secondary | ICD-10-CM | POA: Diagnosis not present

## 2021-06-26 LAB — CBC WITH DIFFERENTIAL (CANCER CENTER ONLY)
Abs Immature Granulocytes: 0.02 10*3/uL (ref 0.00–0.07)
Basophils Absolute: 0 10*3/uL (ref 0.0–0.1)
Basophils Relative: 0 %
Eosinophils Absolute: 0.1 10*3/uL (ref 0.0–0.5)
Eosinophils Relative: 1 %
HCT: 32.8 % — ABNORMAL LOW (ref 39.0–52.0)
Hemoglobin: 11.6 g/dL — ABNORMAL LOW (ref 13.0–17.0)
Immature Granulocytes: 0 %
Lymphocytes Relative: 17 %
Lymphs Abs: 1.6 10*3/uL (ref 0.7–4.0)
MCH: 31.4 pg (ref 26.0–34.0)
MCHC: 35.4 g/dL (ref 30.0–36.0)
MCV: 88.6 fL (ref 80.0–100.0)
Monocytes Absolute: 0.9 10*3/uL (ref 0.1–1.0)
Monocytes Relative: 9 %
Neutro Abs: 6.9 10*3/uL (ref 1.7–7.7)
Neutrophils Relative %: 73 %
Platelet Count: 216 10*3/uL (ref 150–400)
RBC: 3.7 MIL/uL — ABNORMAL LOW (ref 4.22–5.81)
RDW: 13.2 % (ref 11.5–15.5)
WBC Count: 9.5 10*3/uL (ref 4.0–10.5)
nRBC: 0 % (ref 0.0–0.2)

## 2021-06-26 LAB — CMP (CANCER CENTER ONLY)
ALT: 42 U/L (ref 0–44)
AST: 25 U/L (ref 15–41)
Albumin: 4 g/dL (ref 3.5–5.0)
Alkaline Phosphatase: 69 U/L (ref 38–126)
Anion gap: 9 (ref 5–15)
BUN: 37 mg/dL — ABNORMAL HIGH (ref 8–23)
CO2: 27 mmol/L (ref 22–32)
Calcium: 9.4 mg/dL (ref 8.9–10.3)
Chloride: 105 mmol/L (ref 98–111)
Creatinine: 1.91 mg/dL — ABNORMAL HIGH (ref 0.61–1.24)
GFR, Estimated: 35 mL/min — ABNORMAL LOW (ref >60)
Glucose, Bld: 119 mg/dL — ABNORMAL HIGH (ref 70–99)
Potassium: 3.6 mmol/L (ref 3.5–5.1)
Sodium: 141 mmol/L (ref 135–145)
Total Bilirubin: 1 mg/dL (ref 0.3–1.2)
Total Protein: 7.5 g/dL (ref 6.5–8.1)

## 2021-06-26 LAB — MAGNESIUM: Magnesium: 1.9 mg/dL (ref 1.7–2.4)

## 2021-06-26 LAB — IRON AND IRON BINDING CAPACITY (CC-WL,HP ONLY)
Iron: 41 ug/dL — ABNORMAL LOW (ref 45–182)
Saturation Ratios: 18 % (ref 17.9–39.5)
TIBC: 232 ug/dL — ABNORMAL LOW (ref 250–450)
UIBC: 191 ug/dL (ref 117–376)

## 2021-06-26 LAB — FERRITIN: Ferritin: 176 ng/mL (ref 24–336)

## 2021-06-27 ENCOUNTER — Other Ambulatory Visit: Payer: Self-pay | Admitting: Internal Medicine

## 2021-06-27 ENCOUNTER — Telehealth: Payer: Self-pay | Admitting: Internal Medicine

## 2021-06-27 ENCOUNTER — Telehealth (HOSPITAL_COMMUNITY): Payer: Self-pay

## 2021-06-27 DIAGNOSIS — R1312 Dysphagia, oropharyngeal phase: Secondary | ICD-10-CM

## 2021-06-27 NOTE — Telephone Encounter (Signed)
Deirdre Heber [place] is a Astronomer for the patient asking to have orders put in at [facility] for a modified barium swallow study. The order can be faxed to [phone], she also asks if some one can call her when this has been scheduled.

## 2021-06-27 NOTE — Telephone Encounter (Signed)
Attempted to contact patient to schedule OP MBS - left voicemail. ?

## 2021-06-30 ENCOUNTER — Other Ambulatory Visit (HOSPITAL_COMMUNITY): Payer: Self-pay

## 2021-06-30 DIAGNOSIS — I700 Atherosclerosis of aorta: Secondary | ICD-10-CM | POA: Diagnosis not present

## 2021-06-30 DIAGNOSIS — D630 Anemia in neoplastic disease: Secondary | ICD-10-CM | POA: Diagnosis not present

## 2021-06-30 DIAGNOSIS — I951 Orthostatic hypotension: Secondary | ICD-10-CM | POA: Diagnosis not present

## 2021-06-30 DIAGNOSIS — Z7952 Long term (current) use of systemic steroids: Secondary | ICD-10-CM | POA: Diagnosis not present

## 2021-06-30 DIAGNOSIS — Z9181 History of falling: Secondary | ICD-10-CM | POA: Diagnosis not present

## 2021-06-30 DIAGNOSIS — Z7982 Long term (current) use of aspirin: Secondary | ICD-10-CM | POA: Diagnosis not present

## 2021-06-30 DIAGNOSIS — C7A019 Malignant carcinoid tumor of the small intestine, unspecified portion: Secondary | ICD-10-CM | POA: Diagnosis not present

## 2021-06-30 DIAGNOSIS — N1831 Chronic kidney disease, stage 3a: Secondary | ICD-10-CM | POA: Diagnosis not present

## 2021-06-30 DIAGNOSIS — D500 Iron deficiency anemia secondary to blood loss (chronic): Secondary | ICD-10-CM | POA: Diagnosis not present

## 2021-06-30 DIAGNOSIS — D631 Anemia in chronic kidney disease: Secondary | ICD-10-CM | POA: Diagnosis not present

## 2021-06-30 DIAGNOSIS — Z950 Presence of cardiac pacemaker: Secondary | ICD-10-CM | POA: Diagnosis not present

## 2021-06-30 DIAGNOSIS — I129 Hypertensive chronic kidney disease with stage 1 through stage 4 chronic kidney disease, or unspecified chronic kidney disease: Secondary | ICD-10-CM | POA: Diagnosis not present

## 2021-06-30 DIAGNOSIS — R1310 Dysphagia, unspecified: Secondary | ICD-10-CM

## 2021-06-30 DIAGNOSIS — K912 Postsurgical malabsorption, not elsewhere classified: Secondary | ICD-10-CM | POA: Diagnosis not present

## 2021-07-01 ENCOUNTER — Other Ambulatory Visit: Payer: Self-pay

## 2021-07-01 ENCOUNTER — Telehealth: Payer: Self-pay | Admitting: Hematology

## 2021-07-01 ENCOUNTER — Encounter: Payer: Self-pay | Admitting: Hematology and Oncology

## 2021-07-01 ENCOUNTER — Inpatient Hospital Stay: Payer: Medicare Other | Attending: Hematology | Admitting: Nutrition

## 2021-07-01 DIAGNOSIS — R197 Diarrhea, unspecified: Secondary | ICD-10-CM | POA: Insufficient documentation

## 2021-07-01 DIAGNOSIS — E876 Hypokalemia: Secondary | ICD-10-CM | POA: Insufficient documentation

## 2021-07-01 DIAGNOSIS — C7A098 Malignant carcinoid tumors of other sites: Secondary | ICD-10-CM | POA: Insufficient documentation

## 2021-07-01 NOTE — Progress Notes (Signed)
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  He is being followed by Dr. Irene Limbo.  Past medical history includes chronic kidney disease stage III, hypertension, short gut syndrome, and asthma.  Medications include Remeron, multivitamin, vitamin D.  Labs include glucose 119, BUN 37, creatinine 1.91 on December 29.  Height: 6 feet 1 inch. Weight: 141 pounds on December 29. Usual body weight: 280 pounds per patient many years ago. BMI: 18.6.  Patient reports he has chronic diarrhea.  He is concerned regarding continued weight loss.  Reports he has had loose watery stools for quite some time.  Reports surgery was approximately 11 years ago when he was treated at [facility].  He does not have a very good appetite.  He is not enthusiastic about drinking Ensure and has not tried other nutrition drinks.  He thinks he drinks plenty of water.  He has been eating some candy.  Nutrition diagnosis:  Unintended weight loss related to carcinoid tumor/short gut syndrome and chronic diarrhea as evidenced by 10% weight loss over 6 months which is significant.  Intervention: Educated patient on the importance of consuming smaller more frequent meals and snacks.  Encouraged him to maximize energy from carbohydrates and fats.  Recommend avoid simple carbohydrates specifically no sorbitol, mannitol, or xylitol. Try to consume 8 ounces of water/liquid after each loose stool. Educated on low fiber diet to minimize diarrhea.  Encouraged 5-10 g of soluble fiber daily. Encouraged adequate potassium and educated on appropriate fruits and vegetables with increased potassium. Recommend checking for vitamin deficiencies: Vitamin B12, vitamin A, vitamin D, vitamin E, and vitamin K. Add yogurt with live cultures daily. Provided samples of additional oral nutrition supplements.  Encourage patient to try one half carton at a time to assess tolerance. Nutrition fact  sheets provided.  Questions answered.  Contact information given.  Monitoring, evaluation, goals: Patient will work to increase calories and protein and minimize diarrhea to minimize further weight loss.  Patient to contact RD with questions.  **Disclaimer: This note was dictated with voice recognition software. Similar sounding words can inadvertently be transcribed and this note may contain transcription errors which may not have been corrected upon publication of note.**

## 2021-07-01 NOTE — Progress Notes (Signed)
HEMATOLOGY/ONCOLOGY CONSULTATION NOTE  Date of Service: 07/01/2021  Patient Care Team: [name], MD as PCP - General (Internal Medicine) Sueanne Margarita, MD as PCP - Cardiology (Cardiology) [name], RN as Case Manager  CHIEF COMPLAINTS/PURPOSE OF CONSULTATION:  Iron deficiency anemia Metastatic small intestinal carcinoid  HISTORY OF PRESENTING ILLNESS:   Alan [name] is a wonderful 81 y.o. male who has been referred to [place] by Dr Scarlette Calico for evaluation and management of iron deficiency anemia.  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  He notes that he is currently on lanreotide but this is not helping his diarrhea.  He also takes Lomotil and Imodium as needed without much effect.  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  He notes that he will talk with his primary oncologist about getting a dietary evaluation. Notes that he has lost between 30 and 40 pounds over the last 6 to 12 months and is concerned about this.  He notes no overt GI bleeding.  No hematuria.  No epistaxis.  No other visible evidence of blood loss.  His last labs done with results available in the system are from 02/15/2021 with the CBC showing a hemoglobin of 12.3 with an MCV of 92.6 with normal WBC count and platelets. B12 levels were 514 Ferritin was 163.6 with an iron saturation of 9.4 percent Folate was within normal limits Patient notes chronic fatigue.  He notes he is somewhat scared of eating since he has so much diarrhea. Wife is present with him during this interview.  INTERVAL HISTORY  Alan [name] is here for follow-up of his metastatic small cell malignant carcinoid and iron deficiency anemia. He has been following at [facility] for management of his metastatic small cell carcinoid but notes that he would like to  transfer his care here.  He has been on monthly lanreotide. He notes he continues to have significant diarrhea and has not been using Imodium much and only uses it sometimes. He has not been on potassium replacement.  Labs done today CBC shows hemoglobin of 11.6, WBC count of 9.5k, platelets of 216k. Potassium critically low at 2.3.  He was given option to go to the emergency room but preferred to treat this as outpatient.  He was given aggressive oral and IV potassium replacement.  Patient notes significant fatigue and poor p.o. intake.  Significant issues with anxiety.  Wife notes that his anxiety comes in the way of his eating well and following the plan. He would like to see the dietitian. He would like to transfer his care and continued lanreotide shots here to [place].   MEDICAL HISTORY:  Past Medical History:  Diagnosis Date   Asthma    Carcinoid tumor, small intestine, malignant (HCC)    Chronic diarrhea    CKD (chronic kidney disease) stage 3, GFR 30-59 ml/min (HCC)    HTN (hypertension)    Short gut syndrome     SURGICAL HISTORY: Past Surgical History:  Procedure Laterality Date   COLON SURGERY     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   Procedure: PACEMAKER IMPLANT;  Surgeon: Constance Haw, MD;  Location: [facility];  Service: Cardiovascular;  Laterality: N/A;    SOCIAL HISTORY: Social History   Socioeconomic History   Marital status: Married    Spouse name:  Not on file   Number of children: Not on file   Years of education: Not on file   Highest education level: Not on file  Occupational History   Not on file  Tobacco Use   Smoking status: Former    Packs/day: 0.30    Years: 61.00    Pack years: 18.30    Types: Cigarettes    Start date: 73    Quit date: 03/03/2020    Years since quitting: 1.3   Smokeless tobacco: Never  Vaping Use   Vaping Use: Never used  Substance and Sexual Activity   Alcohol use: Not Currently   Drug  use: No   Sexual activity: Not Currently  Other Topics Concern   Not on file  Social History Narrative   Not on file   Social Determinants of Health   Financial Resource Strain: Not on file  Food Insecurity: Not on file  Transportation Needs: Not on file  Physical Activity: Not on file  Stress: Not on file  Social Connections: Not on file  Intimate Partner Violence: Not on file    FAMILY HISTORY: Family History  Problem Relation Age of Onset   Alzheimer's disease Mother     ALLERGIES:  is allergic to penicillins and lisinopril.  MEDICATIONS:  Current Outpatient Medications  Medication Sig Dispense Refill   potassium chloride SA (KLOR-CON M) 20 MEQ tablet 59meq three times a day po for 3 days then 27meq po BID 60 tablet 1   aspirin 81 MG EC tablet Take 81 mg by mouth daily. Swallow whole.     Ensure Plus (ENSURE PLUS) LIQD Take 237 mLs by mouth 2 (two) times daily between meals.     fludrocortisone (FLORINEF) 0.1 MG tablet Take 1 tablet (0.1 mg total) by mouth daily. 90 tablet 3   iron polysaccharides (NIFEREX) 150 MG capsule Take 1 capsule (150 mg total) by mouth 3 (three) times a week. (Patient taking differently: Take 150 mg by mouth daily.) 30 capsule 2   LANREOTIDE ACETATE [place] Inject 1 Dose into the skin every 30 (thirty) days.     midodrine (PROAMATINE) 10 MG tablet Take 1 tablet (10 mg total) by mouth 3 (three) times daily. 270 tablet 3   mirtazapine (REMERON) 7.5 MG tablet Take 7.5 mg by mouth daily.     Multiple Vitamin (MULTIVITAMIN WITH MINERALS) TABS Take 1 tablet by mouth daily.     Multiple Vitamins-Minerals (PRESERVISION AREDS 2 PO) Take 1 capsule by mouth daily.     multivitamin (ONE-A-DAY MEN'S) TABS tablet 1 tab     SYMBICORT 160-4.5 MCG/ACT inhaler Inhale 2 puffs into the lungs daily as needed (wheezing).     Vitamin D, Ergocalciferol, (DRISDOL) 1.25 MG (50000 UNIT) CAPS capsule Take 50,000 Units by mouth every 7 (seven) days. (Patient not taking: Reported on  06/26/2021)     No current facility-administered medications for this visit.    REVIEW OF SYSTEMS:    .10 Point review of Systems was done is negative except as noted above.   PHYSICAL EXAMINATION: ECOG PERFORMANCE STATUS: 2 - Symptomatic, <50% confined to bed  . Vitals:   06/11/21 1446  BP: 120/73  Pulse: 76  Resp: 17  Temp: 98.1 F (36.7 C)  SpO2: 100%   Filed Weights   06/11/21 1446  Weight: 139 lb 3.2 oz (63.1 kg)   .Body mass index is 18.37 kg/m.  Marland Kitchen GENERAL:alert, in no acute distress and comfortable SKIN: no acute rashes, no significant lesions EYES:  conjunctiva are pink and non-injected, sclera anicteric OROPHARYNX: MMM, no exudates, no oropharyngeal erythema or ulceration NECK: supple, no JVD LYMPH:  no palpable lymphadenopathy in the cervical, axillary or inguinal regions LUNGS: clear to auscultation b/l with normal respiratory effort HEART: regular rate & rhythm ABDOMEN:  normoactive bowel sounds , non tender, not distended. Extremity: no pedal edema PSYCH: alert & oriented x 3 with fluent speech NEURO: no focal motor/sensory deficits    LABORATORY DATA:  I have reviewed the data as listed Component     Latest Ref Rng & Units 06/11/2021  WBC     4.0 - 10.5 K/uL 9.6  RBC     4.22 - 5.81 MIL/uL 3.89 (L)  Hemoglobin     13.0 - 17.0 g/dL 12.1 (L)  HCT     39.0 - 52.0 % 34.7 (L)  MCV     80.0 - 100.0 fL 89.2  MCH     26.0 - 34.0 pg 31.1  MCHC     30.0 - 36.0 g/dL 34.9  RDW     11.5 - 15.5 % 13.2  Platelets     150 - 400 K/uL 195  nRBC     0.0 - 0.2 % 0.0  Neutrophils     % 72  NEUT#     1.7 - 7.7 K/uL 6.9  Lymphocytes     % 18  Lymphocyte #     0.7 - 4.0 K/uL 1.7  Monocytes Relative     % 8  Monocyte #     0.1 - 1.0 K/uL 0.8  Eosinophil     % 2  Eosinophils Absolute     0.0 - 0.5 K/uL 0.1  Basophil     % 0  Basophils Absolute     0.0 - 0.1 K/uL 0.0  Immature Granulocytes     % 0  Abs Immature Granulocytes     0.00 -  0.07 K/uL 0.03  Sodium     135 - 145 mmol/L 146 (H)  Potassium     3.5 - 5.1 mmol/L 2.3 (LL)  Chloride     98 - 111 mmol/L 100  CO2     22 - 32 mmol/L 33 (H)  Glucose     70 - 99 mg/dL 175 (H)  BUN     8 - 23 mg/dL 27 (H)  Creatinine     0.61 - 1.24 mg/dL 2.01 (H)  Calcium     8.9 - 10.3 mg/dL 9.1  Total Protein     6.5 - 8.1 g/dL 7.5  Albumin     3.5 - 5.0 g/dL 4.0  AST     15 - 41 U/L 25  ALT     0 - 44 U/L 26  Alkaline Phosphatase     38 - 126 U/L 74  Total Bilirubin     0.3 - 1.2 mg/dL 1.2  GFR, Est Non African American     >60 mL/min 33 (L)  Anion gap     5 - 15 13  LDH     98 - 192 U/L 233 (H)   Component     Latest Ref Rng & Units 06/09/2021  Iron     42 - 165 ug/dL 68  Transferrin     212.0 - 360.0 mg/dL 165.0 (L)  Saturation Ratios     20.0 - 50.0 % 29.4  Ferritin     22.0 - 322.0 ng/mL 129.9  TIBC     250.0 -  450.0 mcg/dL 231.0 (L)    RADIOGRAPHIC STUDIES: I have personally reviewed the radiological images as listed and agreed with the findings in the report. No results found.  ASSESSMENT & PLAN:   81 year old male with  #1 metastatic small bowel malignant carcinoid with chronic significant diarrhea and weight loss. Patient is currently on lanreotide. Plan -Patient has been following with primary oncologist at [facility] (Dr. Jonelle Sidle) -Patient would like to transfer care to our cancer center. -We will place orders and set him up for lanreotide every 4 weeks. -He will start taking his Imodium which has helped control his diarrhea along with lanreotide. -PET dotatate scan to evaluate for progression of his metastatic small bowel carcinoid. -Dietitian referral to optimize p.o. intake -Discussed and try to address his anxiety.  #2 significant hypokalemia Likely related to his decreased p.o. intake, chronic diarrhea and the use of Florinef. PLan -Was not taking his oral potassium. -P.o. potassium x 60 meq powder given and  orange juice in the clinic.  He was also scheduled for IV potassium the following morning for 74meq along with IV fluids - p.o. potassium at home 21meq three times a day po for 3 days then 39meq po BID -Repeat potassium levels next day. -Counseled on using Imodium to control his diarrhea.  #2 iron deficiency anemia Component     Latest Ref Rng & Units 06/09/2021  Iron     42 - 165 ug/dL 68  Transferrin     212.0 - 360.0 mg/dL 165.0 (L)  Saturation Ratios     20.0 - 50.0 % 29.4  Ferritin     22.0 - 322.0 ng/mL 129.9  TIBC     250.0 - 450.0 mcg/dL 231.0 (L)   Plan -Patient's ferritin more than 100 and iron saturation more than 20%. -No indication for additional IV iron at this time  Follow-up -Transferred emergency room for severe hypokalemia K 2.3 in the setting of severe dehydration diarrhea and hypotension. -Outpatient PET dotatate study in 7 to 10 days -Please schedule to start lanreotide every 4 weeks starting in 5 to 7 days -Labs with lanreotide in 5 to 7 days -Referral to [facility] for severe protein calorie malnutrition -Return to clinic with Dr. Irene Limbo in 06/26/2021   All of the patients questions were answered with apparent satisfaction. The patient knows to call the clinic with any problems, questions or concerns.  . The total time spent in the appointment was 41 minutes including reviewing labs, treatment of severe hypokalemia, arranging transfer of care and placing orders for lanreotide, management of severe diarrhea.    Sullivan Lone MD [place] AAHIVMS [facility] Hematology/Oncology Physician [facility]

## 2021-07-01 NOTE — Telephone Encounter (Signed)
Scheduled follow-up appointments per 12/29 los. Patient is aware. 

## 2021-07-02 ENCOUNTER — Ambulatory Visit (INDEPENDENT_AMBULATORY_CARE_PROVIDER_SITE_OTHER): Payer: Medicare Other | Admitting: *Deleted

## 2021-07-02 ENCOUNTER — Encounter: Payer: Self-pay | Admitting: Hematology and Oncology

## 2021-07-02 DIAGNOSIS — Z9181 History of falling: Secondary | ICD-10-CM | POA: Diagnosis not present

## 2021-07-02 DIAGNOSIS — I951 Orthostatic hypotension: Secondary | ICD-10-CM

## 2021-07-02 DIAGNOSIS — C7A019 Malignant carcinoid tumor of the small intestine, unspecified portion: Secondary | ICD-10-CM | POA: Diagnosis not present

## 2021-07-02 DIAGNOSIS — Z7982 Long term (current) use of aspirin: Secondary | ICD-10-CM | POA: Diagnosis not present

## 2021-07-02 DIAGNOSIS — K912 Postsurgical malabsorption, not elsewhere classified: Secondary | ICD-10-CM | POA: Diagnosis not present

## 2021-07-02 DIAGNOSIS — Z7952 Long term (current) use of systemic steroids: Secondary | ICD-10-CM | POA: Diagnosis not present

## 2021-07-02 DIAGNOSIS — D500 Iron deficiency anemia secondary to blood loss (chronic): Secondary | ICD-10-CM | POA: Diagnosis not present

## 2021-07-02 DIAGNOSIS — I129 Hypertensive chronic kidney disease with stage 1 through stage 4 chronic kidney disease, or unspecified chronic kidney disease: Secondary | ICD-10-CM | POA: Diagnosis not present

## 2021-07-02 DIAGNOSIS — I10 Essential (primary) hypertension: Secondary | ICD-10-CM

## 2021-07-02 DIAGNOSIS — D631 Anemia in chronic kidney disease: Secondary | ICD-10-CM | POA: Diagnosis not present

## 2021-07-02 DIAGNOSIS — Z950 Presence of cardiac pacemaker: Secondary | ICD-10-CM | POA: Diagnosis not present

## 2021-07-02 DIAGNOSIS — N1831 Chronic kidney disease, stage 3a: Secondary | ICD-10-CM | POA: Diagnosis not present

## 2021-07-02 DIAGNOSIS — D630 Anemia in neoplastic disease: Secondary | ICD-10-CM | POA: Diagnosis not present

## 2021-07-02 DIAGNOSIS — I700 Atherosclerosis of aorta: Secondary | ICD-10-CM | POA: Diagnosis not present

## 2021-07-02 NOTE — Progress Notes (Addendum)
HEMATOLOGY/ONCOLOGY CLINIC NOTE  Date of Service: .06/26/2021   Patient Care Team: [name], MD as PCP - General (Internal Medicine) Sueanne Margarita, MD as PCP - Cardiology (Cardiology) [name], RN as Case Manager  CHIEF COMPLAINTS/PURPOSE OF CONSULTATION:  Follow-up for Metastatic small intestinal carcinoid with severe diarrhea and life-threatening hypokalemia  HISTORY OF PRESENTING ILLNESS:  Please see previous note for details on initial presentation  INTERVAL HISTORY  Alan [name] is here for follow-up of his metastatic small bowel neuroendocrine tumor.  He did receive his monthly dose of lanreotide recently and has been using Imodium more regularly with improvement in his diarrhea.  We discussed scheduling his Imodium at 2 mg twice daily to try to control his diarrhea and it is well controlled we can reduce that. He is eating a little better and has been taking his potassium regularly.  His potassium levels which were severely low at 2.3 are now back up to the normal range when last checked on 06/26/2021. He notes episodes of syncope had a couple of episodes of some mild orthostasis. Notes significant anxiety which we talked about.  His wife was present for the discussion and is very supportive. He has gained 2 pounds with better hydration and p.o. intake.  MEDICAL HISTORY:  Past Medical History:  Diagnosis Date   Asthma    Carcinoid tumor, small intestine, malignant (HCC)    Chronic diarrhea    CKD (chronic kidney disease) stage 3, GFR 30-59 ml/min (HCC)    HTN (hypertension)    Short gut syndrome     SURGICAL HISTORY: Past Surgical History:  Procedure Laterality Date   COLON SURGERY     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   Procedure: PACEMAKER IMPLANT;  Surgeon: Constance Haw, MD;  Location: [facility];  Service: Cardiovascular;  Laterality: N/A;    SOCIAL HISTORY: Social History   Socioeconomic History   Marital status:  Married    Spouse name: Not on file   Number of children: Not on file   Years of education: Not on file   Highest education level: Not on file  Occupational History   Not on file  Tobacco Use   Smoking status: Former    Packs/day: 0.30    Years: 61.00    Pack years: 18.30    Types: Cigarettes    Start date: 35    Quit date: 03/03/2020    Years since quitting: 1.3   Smokeless tobacco: Never  Vaping Use   Vaping Use: Never used  Substance and Sexual Activity   Alcohol use: Not Currently   Drug use: No   Sexual activity: Not Currently  Other Topics Concern   Not on file  Social History Narrative   Not on file   Social Determinants of Health   Financial Resource Strain: Low Risk    Difficulty of Paying Living Expenses: Not very hard  Food Insecurity: No Food Insecurity   Worried About Running Out of Food in the Last Year: Never true   [place] in the Last Year: Never true  Transportation Needs: No Transportation Needs   Lack of Transportation (Medical): No   Lack of Transportation (Non-Medical): No  Physical Activity: Not on file  Stress: Not on file  Social Connections: Not on file  Intimate Partner Violence: Not on file    FAMILY HISTORY: Family History  Problem Relation Age of Onset   Alzheimer's disease Mother  ALLERGIES:  is allergic to penicillins and lisinopril.  MEDICATIONS:  Current Outpatient Medications  Medication Sig Dispense Refill   aspirin 81 MG EC tablet Take 81 mg by mouth daily. Swallow whole.     Ensure Plus (ENSURE PLUS) LIQD Take 237 mLs by mouth 2 (two) times daily between meals.     fludrocortisone (FLORINEF) 0.1 MG tablet Take 1 tablet (0.1 mg total) by mouth daily. 90 tablet 3   iron polysaccharides (NIFEREX) 150 MG capsule Take 1 capsule (150 mg total) by mouth 3 (three) times a week. (Patient taking differently: Take 150 mg by mouth daily.) 30 capsule 2   LANREOTIDE ACETATE [place] Inject 1 Dose into the skin every 30 (thirty)  days.     midodrine (PROAMATINE) 10 MG tablet Take 1 tablet (10 mg total) by mouth 3 (three) times daily. 270 tablet 3   mirtazapine (REMERON) 7.5 MG tablet Take 7.5 mg by mouth daily.     Multiple Vitamin (MULTIVITAMIN WITH MINERALS) TABS Take 1 tablet by mouth daily.     Multiple Vitamins-Minerals (PRESERVISION AREDS 2 PO) Take 1 capsule by mouth daily.     multivitamin (ONE-A-DAY MEN'S) TABS tablet 1 tab     potassium chloride SA (KLOR-CON M) 20 MEQ tablet 53meq three times a day po for 3 days then 11meq po BID 60 tablet 1   SYMBICORT 160-4.5 MCG/ACT inhaler Inhale 2 puffs into the lungs daily as needed (wheezing).     Vitamin D, Ergocalciferol, (DRISDOL) 1.25 MG (50000 UNIT) CAPS capsule Take 50,000 Units by mouth every 7 (seven) days. (Patient not taking: Reported on 06/26/2021)     No current facility-administered medications for this visit.    REVIEW OF SYSTEMS:    .10 Point review of Systems was done is negative except as noted above.  PHYSICAL EXAMINATION: ECOG PERFORMANCE STATUS: 2 - Symptomatic, <50% confined to bed  . Vitals:   06/26/21 1006  BP: 136/86  Pulse: 80  Resp: 15  Temp: 97.9 F (36.6 C)  SpO2: 98%   Filed Weights   06/26/21 1006  Weight: 141 lb (64 kg)   .Body mass index is 18.6 kg/m.  Marland Kitchen GENERAL:alert, in no acute distress and comfortable SKIN: no acute rashes, no significant lesions EYES: conjunctiva are pink and non-injected, sclera anicteric OROPHARYNX: MMM, no exudates, no oropharyngeal erythema or ulceration NECK: supple, no JVD LYMPH:  no palpable lymphadenopathy in the cervical, axillary or inguinal regions LUNGS: clear to auscultation b/l with normal respiratory effort HEART: regular rate & rhythm ABDOMEN:  normoactive bowel sounds , non tender, not distended. Extremity: no pedal edema PSYCH: alert & oriented x 3 with fluent speech NEURO: no focal motor/sensory deficits     LABORATORY DATA:  I have reviewed the data as  listed  . CBC Latest Ref Rng & Units 06/26/2021 06/16/2021 06/11/2021  WBC 4.0 - 10.5 K/uL 9.5 11.2(H) 9.6  Hemoglobin 13.0 - 17.0 g/dL 11.6(L) 12.3(L) 12.1(L)  Hematocrit 39.0 - 52.0 % 32.8(L) 35.6(L) 34.7(L)  Platelets 150 - 400 K/uL 216 191 195   \. CMP Latest Ref Rng & Units 06/26/2021 06/16/2021 06/12/2021  Glucose 70 - 99 mg/dL 119(H) 109(H) 119(H)  BUN 8 - 23 mg/dL 37(H) 29(H) 29(H)  Creatinine 0.61 - 1.24 mg/dL 1.91(H) 1.89(H) 1.86(H)  Sodium 135 - 145 mmol/L 141 146(H) 148(H)  Potassium 3.5 - 5.1 mmol/L 3.6 4.1 2.7(LL)  Chloride 98 - 111 mmol/L 105 107 104  CO2 22 - 32 mmol/L 27 28 31   Calcium  8.9 - 10.3 mg/dL 9.4 9.4 8.8(L)  Total Protein 6.5 - 8.1 g/dL 7.5 7.7 6.8  Total Bilirubin 0.3 - 1.2 mg/dL 1.0 0.8 0.9  Alkaline Phos 38 - 126 U/L 69 71 64  AST 15 - 41 U/L 25 30 29   ALT 0 - 44 U/L 42 29 24   . Lab Results  Component Value Date   IRON 41 (L) 06/26/2021   TIBC 232 (L) 06/26/2021   IRONPCTSAT 18 06/26/2021   (Iron and TIBC)  Lab Results  Component Value Date   FERRITIN 176 06/26/2021    Component     Latest Ref Rng & Units 06/11/2021  WBC     4.0 - 10.5 K/uL 9.6  RBC     4.22 - 5.81 MIL/uL 3.89 (L)  Hemoglobin     13.0 - 17.0 g/dL 12.1 (L)  HCT     39.0 - 52.0 % 34.7 (L)  MCV     80.0 - 100.0 fL 89.2  MCH     26.0 - 34.0 pg 31.1  MCHC     30.0 - 36.0 g/dL 34.9  RDW     11.5 - 15.5 % 13.2  Platelets     150 - 400 K/uL 195  nRBC     0.0 - 0.2 % 0.0  Neutrophils     % 72  NEUT#     1.7 - 7.7 K/uL 6.9  Lymphocytes     % 18  Lymphocyte #     0.7 - 4.0 K/uL 1.7  Monocytes Relative     % 8  Monocyte #     0.1 - 1.0 K/uL 0.8  Eosinophil     % 2  Eosinophils Absolute     0.0 - 0.5 K/uL 0.1  Basophil     % 0  Basophils Absolute     0.0 - 0.1 K/uL 0.0  Immature Granulocytes     % 0  Abs Immature Granulocytes     0.00 - 0.07 K/uL 0.03  Sodium     135 - 145 mmol/L 146 (H)  Potassium     3.5 - 5.1 mmol/L 2.3 (LL)  Chloride      98 - 111 mmol/L 100  CO2     22 - 32 mmol/L 33 (H)  Glucose     70 - 99 mg/dL 175 (H)  BUN     8 - 23 mg/dL 27 (H)  Creatinine     0.61 - 1.24 mg/dL 2.01 (H)  Calcium     8.9 - 10.3 mg/dL 9.1  Total Protein     6.5 - 8.1 g/dL 7.5  Albumin     3.5 - 5.0 g/dL 4.0  AST     15 - 41 U/L 25  ALT     0 - 44 U/L 26  Alkaline Phosphatase     38 - 126 U/L 74  Total Bilirubin     0.3 - 1.2 mg/dL 1.2  GFR, Est Non African American     >60 mL/min 33 (L)  Anion gap     5 - 15 13  LDH     98 - 192 U/L 233 (H)   Component     Latest Ref Rng & Units 06/09/2021  Iron     42 - 165 ug/dL 68  Transferrin     212.0 - 360.0 mg/dL 165.0 (L)  Saturation Ratios     20.0 - 50.0 % 29.4  Ferritin  22.0 - 322.0 ng/mL 129.9  TIBC     250.0 - 450.0 mcg/dL 231.0 (L)    RADIOGRAPHIC STUDIES: I have personally reviewed the radiological images as listed and agreed with the findings in the report. No results found.  ASSESSMENT & PLAN:   81 year old male with  #1 Metastatic small bowel malignant carcinoid with chronic significant diarrhea and weight loss. Patient is currently on lanreotide. Plan -Continue lanreotide every 4 weeks. -He was recommended to schedule his Imodium 2 mg p.o. twice daily to try to control his diarrhea and then we shall back it off to once a day plus additional Imodium as needed. -If not controlled will need to use Lomotil. -Still pending his PET dotatate scan to evaluate for progression of his metastatic small bowel carcinoid -He has been given referral to dietitian to address his dietary intake  #2 s/p  severe hypokalemia -now resolved after aggressive potassium replacement. Likely related to his decreased p.o. intake, chronic diarrhea and the use of Florinef. PLan -Continue p.o. potassium replacement  -Counseled on using Imodium to control his diarrhea.  #3 iron deficiency anemia . Lab Results  Component Value Date   IRON 41 (L) 06/26/2021   TIBC 232  (L) 06/26/2021   IRONPCTSAT 18 06/26/2021   (Iron and TIBC)  Lab Results  Component Value Date   FERRITIN 176 06/26/2021   Plan -Patient's ferritin more than 100  -No indication for additional IV iron at this time  Follow-up Labs today Please schedule next 6 cycles of lanreotide with labs every 4 weeks MD visit with next treatment with lanreotide PET dotatate scan in 1 week Follow-up with nutritional therapy as scheduled Continue home physical therapy as currently in process   All of the patients questions were answered with apparent satisfaction. The patient knows to call the clinic with any problems, questions or concerns.   Sullivan Lone MD [place] AAHIVMS [facility] [place] [facility] Hematology/Oncology Physician [facility]   .

## 2021-07-03 DIAGNOSIS — C7A019 Malignant carcinoid tumor of the small intestine, unspecified portion: Secondary | ICD-10-CM | POA: Diagnosis not present

## 2021-07-03 DIAGNOSIS — N1831 Chronic kidney disease, stage 3a: Secondary | ICD-10-CM | POA: Diagnosis not present

## 2021-07-03 DIAGNOSIS — Z950 Presence of cardiac pacemaker: Secondary | ICD-10-CM | POA: Diagnosis not present

## 2021-07-03 DIAGNOSIS — K912 Postsurgical malabsorption, not elsewhere classified: Secondary | ICD-10-CM | POA: Diagnosis not present

## 2021-07-03 DIAGNOSIS — I129 Hypertensive chronic kidney disease with stage 1 through stage 4 chronic kidney disease, or unspecified chronic kidney disease: Secondary | ICD-10-CM | POA: Diagnosis not present

## 2021-07-03 DIAGNOSIS — Z9181 History of falling: Secondary | ICD-10-CM | POA: Diagnosis not present

## 2021-07-03 DIAGNOSIS — I951 Orthostatic hypotension: Secondary | ICD-10-CM | POA: Diagnosis not present

## 2021-07-03 DIAGNOSIS — Z7982 Long term (current) use of aspirin: Secondary | ICD-10-CM | POA: Diagnosis not present

## 2021-07-03 DIAGNOSIS — D630 Anemia in neoplastic disease: Secondary | ICD-10-CM | POA: Diagnosis not present

## 2021-07-03 DIAGNOSIS — Z7952 Long term (current) use of systemic steroids: Secondary | ICD-10-CM | POA: Diagnosis not present

## 2021-07-03 DIAGNOSIS — D631 Anemia in chronic kidney disease: Secondary | ICD-10-CM | POA: Diagnosis not present

## 2021-07-03 DIAGNOSIS — D500 Iron deficiency anemia secondary to blood loss (chronic): Secondary | ICD-10-CM | POA: Diagnosis not present

## 2021-07-03 DIAGNOSIS — I700 Atherosclerosis of aorta: Secondary | ICD-10-CM | POA: Diagnosis not present

## 2021-07-03 NOTE — Chronic Care Management (AMB) (Signed)
Chronic Care Management   CCM RN Visit Note  07/03/2021 Name: Alan [name] MRN: [id number] DOB: 1941-01-19  Subjective: Alan [name] is a 81 y.o. year old male who is a primary care patient of Alan Lima, MD. The care management team was consulted for assistance with disease management and care coordination needs.    Engaged with patient and his spouse Alan [name], on [facility] DPR by telephone for initial visit in response to provider referral for case management and/or care coordination services.   Consent to Services:  The patient was given information about Chronic Care Management services, agreed to services, and gave verbal consent 06/13/21 prior to initiation of services.  Please see initial visit note for detailed documentation.  Patient agreed to services and verbal consent obtained.   Assessment: Review of patient past medical history, allergies, medications, health status, including review of consultants reports, laboratory and other test data, was performed as part of comprehensive evaluation and provision of chronic care management services.   SDOH (Social Determinants of Health) assessments and interventions performed:  SDOH Interventions    Flowsheet Row Most Recent Value  SDOH Interventions   Food Insecurity Interventions Intervention Not Indicated  Financial Strain Interventions Intervention Not Indicated  [Patient denies financial strain today]  Housing Interventions Intervention Not Indicated  [single family home x 15 years,  handicapped updated,  lives with family and adult child]  Transportation Interventions Intervention Not Indicated  [wife currently providing transportation- patient not driving due to ongoing episodes syncope]  Depression Interventions/Treatment  Counseling  [Patient reports currently under/ established with psychiatric counseling services- wants to get counseling in [place]: CCM CSW referral placed]      CCM Care Plan  Allergies  Allergen Reactions    Penicillins Hives and Shortness Of Breath   Lisinopril Itching   Outpatient Encounter Medications as of 07/02/2021  Medication Sig   aspirin 81 MG EC tablet Take 81 mg by mouth daily. Swallow whole.   Ensure Plus (ENSURE PLUS) LIQD Take 237 mLs by mouth 2 (two) times daily between meals.   fludrocortisone (FLORINEF) 0.1 MG tablet Take 1 tablet (0.1 mg total) by mouth daily.   iron polysaccharides (NIFEREX) 150 MG capsule Take 1 capsule (150 mg total) by mouth 3 (three) times a week. (Patient taking differently: Take 150 mg by mouth daily.)   LANREOTIDE ACETATE [place] Inject 1 Dose into the skin every 30 (thirty) days.   midodrine (PROAMATINE) 10 MG tablet Take 1 tablet (10 mg total) by mouth 3 (three) times daily.   mirtazapine (REMERON) 7.5 MG tablet Take 7.5 mg by mouth daily.   Multiple Vitamin (MULTIVITAMIN WITH MINERALS) TABS Take 1 tablet by mouth daily.   Multiple Vitamins-Minerals (PRESERVISION AREDS 2 PO) Take 1 capsule by mouth daily.   multivitamin (ONE-A-DAY MEN'S) TABS tablet 1 tab   potassium chloride SA (KLOR-CON M) 20 MEQ tablet 62meq three times a day po for 3 days then 70meq po BID   SYMBICORT 160-4.5 MCG/ACT inhaler Inhale 2 puffs into the lungs daily as needed (wheezing).   Vitamin D, Ergocalciferol, (DRISDOL) 1.25 MG (50000 UNIT) CAPS capsule Take 50,000 Units by mouth every 7 (seven) days. (Patient not taking: Reported on 06/26/2021)   No facility-administered encounter medications on file as of 07/02/2021.   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   GIB (gastrointestinal bleeding) 01/18/2021   Essential hypertension 01/18/2021   Hypokalemia due to excessive gastrointestinal loss of potassium 01/18/2021   Chronic kidney disease, 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[place]) 01/18/2021   Conditions to be addressed/monitored:  Atrial Fibrillation and HTN  Care Plan : RN Care Manager Plan of Care  Updates made by [name], RN since 07/03/2021 12:00 AM     Problem: Chronic Disease Management Needs   Priority: High     Long-Range Goal: Development of plan of care for long term chronic disease management   Start Date: 07/02/2021  Expected End Date: 07/02/2022  Priority: High  Note:   Current Barriers:  Chronic Disease Management support and education needs related to Atrial Fibrillation and HTN Recent hospitalization October 25-27, 2022 for AF with RVR-- 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 Frequent recent episodes syncope/ resulting in falls-- thus far without injury-- patient reports 16- plus falls over last few weeks, all without significant injury: patient has/ uses walker, cane, wheelchair Transitioning care from [facility] to [facility]: is well-established with current counseling provider-- but would like to have counseling services in [place]: will place CCM CSW referral for same  [facility]- inconsistently wears hearing aids: does not like hearing aids, ambient noise-- encouraged to follow up with hearing aid provider to have adjustments as indicated Patient and spouse report that patient has had recent memory issues-- encouraged them to discuss with PCP at time of next office visit  RNCM Clinical Goal(s):  Patient will demonstrate ongoing health management independence as evidenced by adherence to plan of care for A-Fib/ HTN with hypotensive syncopal episodes, presumably due to A-F        through collaboration with RN Care manager, provider, and care team.   Interventions: 1:1 collaboration with primary care provider  regarding development and update of comprehensive plan of care as evidenced by provider attestation and co-signature Inter-disciplinary care team collaboration (see longitudinal plan of care) Evaluation of current treatment plan related to  self management and patient's adherence to plan as established by provider Initial assessment completed 07/02/21 Pain assessment completed: denies acute/ chronic pain: reports occasional "discomfort" that easily manageable with conservative measures Falls assessment completed: HIGH fall risk; fall prevention education provided with specific strategies for fall prevention Depression screening completed: patient remains depressed as per baseline; currently established with counseling services; would like to transition counseling services to closer to his home: will place CCM CSW referral for support in this transition  A-fib:  (Status: New goal.) Long Term Goal  Advised patient to discuss cardiology provider responsibilities for pacemaker provider and "regular" cardiology; ongoing plan for follow up with provider Screening for signs and symptoms of depression related to chronic disease state Assessed social determinant of health barriers Recent hospital visit (October 2022) reviewed: patient verbalizes frustration understanding "which cardiologist does what;" reviewed roles of cardiology providers; reviewed 04/21/21 office visit with Dr. Marlou Porch with patient and spouse; encouraged patient to write a list of questions down to discuss with cardiology provider team; noted patient has not scheduled follow up office visit with Dr. Marlou Porch: wife reports schedule was not released at time of their October visit: encouraged them to put this on the calendar to schedule for April (6 month follow  up recommended 04/21/21); confirmed they will attend 07/28/21 cardiology (pacer) office visit, as currently scheduled Discussed recent pacemaker insertion- report site healing well, confirm  they plan to attend upcoming scheduled office visit with Dr. Curt Bears on 07/28/21 Initiated education around signs/ symptoms development A-Fib along corresponding action plan Discussed ongoing oncology provider office visits/ plan of care- patient/ spouse verbalize good understanding of same Reviewed upcoming scheduled provider office visits, confirmed they have plans at attend all as scheduled: 07/09/21: speech therapy/ [facility]; 07/14/21- oncology provider; 07/28/21- pacer check, Dr. Curt Bears  Hypertension: (Status: New goal.) Long Term Goal  Last practice recorded BP readings:  BP Readings from Last 3 Encounters:  06/26/21 136/86  06/16/21 (!) 142/92  06/12/21 132/82  Most recent eGFR/CrCl: No results found for: EGFR  No components found for: CRCL  Evaluation of current treatment plan related to hypertension self management and patient's adherence to plan as established by provider;   Reviewed medications with patient and discussed importance of compliance;  Discussed plans with patient for ongoing care management follow up and provided patient with direct contact information for care management team; Discussed complications of poorly controlled blood pressure such as heart disease, stroke, circulatory complications, vision complications, kidney impairment, sexual dysfunction;  Reviewed PCP office visit 06/09/21: confirmed home health services that were ordered are now active: patient/ spouse report [facility] home [facility] active for PT, speech therapy, RN, and personal care services: encouraged patient's ongoing active participation in all disciplines; confirmed they have contact information for home health team; encouraged their ongoing communication with home health team, including process for extension of services as time progresses Confirmed patient monitors blood pressures daily at home: reports values between 129-162/80-110; discussed patient's multiple episodes of syncope: they have developed a plan  that patient does not shower with hot water (uses lukewarm water), sits down during showering, spouse provides assistance/ supervision-- this has helped mitigate episodes of syncope during showering; he has also been using urinal during night time hours to prevent having to get out of bed and then having subsequent syncope; patient also mentions that he is trying to stay hydrated- all of these precautions were encouraged Provided education around mechanism of syncope is setting of hypotension and encouraged him to continue the practices they have developed at home to prevent syncope Reviewed medications: patient denies questions/ concerns around medications: spouse assists in medication management; they have developed a plan around timing of midodrine administration in the afternoon/ evening hours, based on afternoon blood pressure values- this has been working for them, and was encouraged; confirmed patient has been taken off blood pressure medications, as well as previously provided medications that can result in low blood pressure; patient/ spouse verbalize good general understanding of purpose, dosing, and scheduling of medications Confirmed patient has not yet scheduled follow up PCP appointment: discussed recommendation from last office visit 06/09/21 to follow up again in 3 months- encouraged spouse/ patient to schedule next visit accordingly- report they will do  Patient Goals/Self-Care Activities: As evidenced by review of EHR, collaboration with care team, and patient reporting during CCM RN CM outreach, Patient [place] will: Take medications as prescribed Attend all scheduled provider appointments Call pharmacy for medication refills Call provider office for new concerns or questions Take effort to follow heart healthy, low salt, low cholesterol diet Continue working with home health team and maintain communication with your home health team Continue your efforts to prevent falls and syncopal  (fainting) episodes: continue using your cane/ walker/ wheelchair and taking the measures  you have initiated at home to stay safe in the shower and at night when you wake up to go the bathroom I have asked the Clinical Social Worker at Dr. Ronnald Ramp' office to contact you by phone to help you transition your established counseling services to [facility], as you have requested: please listen out for a call from the [place] Work team      Plan: Telephone follow up appointment with care management team member scheduled for:  Thursday, July 07, 2021 at 11:30 am The patient has been provided with contact information for the care management team and has been advised to call with any health related questions or concerns  [name], RN, BSN, [place] [phone]: direct office

## 2021-07-03 NOTE — Patient Instructions (Addendum)
Visit Information   Thank you for taking time to talk with me today. Please don't hesitate to contact me if I can be of assistance to you before our next scheduled telephone appointment.  Below are the goals we discussed today:  Patient Self-Care Activities: Patient Alan [name] will: Take medications as prescribed Attend all scheduled provider appointments Call pharmacy for medication refills Call provider office for new concerns or questions Take effort to follow heart healthy, low salt, low cholesterol diet Continue working with home health team and maintain communication with your home health team Continue your efforts to prevent falls and syncopal (fainting) episodes: continue using your cane/ walker/ wheelchair and taking the measures you have initiated at home to stay safe in the shower and at night when you wake up to go the bathroom I have asked the Clinical Social Worker at Dr. Ronnald Ramp' office to contact you by phone to help you transition your established counseling services to [facility], as you have requested: please listen out for a call from the [place] Work team  Our next scheduled telephone follow up visit/ appointment with care management team member is scheduled on:  Thursday August 07, 2021 at 11:30 am  If you need to cancel or re-schedule our visit, please call [phone] and our care guide team will be happy to assist you.   I look forward to hearing about your progress.   [name], RN, BSN, [place] [phone]: direct office  If you are experiencing a Mental Health or [place] or need someone to talk to, please  call the Suicide and Crisis Lifeline: 988 call the [place] National Suicide Prevention Lifeline: (956)164-9371 or TTY: 408 012 9065 TTY 2280267744) to talk to a trained counselor call 1-800-273-TALK (toll free, 24 hour hotline) go to [facility] [place] [phone]) call 911   Syncope, Adult Syncope is when you pass out or faint for a short time. It is caused by a sudden decrease in blood flow to the brain. This can happen for many reasons. It can sometimes happen when seeing blood, getting a shot (injection), or having pain or strong emotions. Most causes of fainting are not dangerous, but in some cases it can be a sign of a serious medical problem. If you faint, get help right away. Call your local emergency services (911 in the [place] Follow these instructions at home: Watch for any changes in your symptoms. Take these actions to stay safe and help with your symptoms: Knowing when you may be about to faint Signs that you may be about to faint include: Feeling dizzy or light-headed. It may feel like the room is spinning. Feeling weak. Feeling like you may vomit (nauseous). Seeing spots or seeing all white or all black. Having cold, clammy skin. Feeling warm and sweaty. Hearing ringing in the ears. If you start to feel like you might faint, sit or lie down right away. If sitting, lower your head down between your legs. If lying down, raise (elevate) your feet above the level of your heart. Breathe deeply and steadily. Wait until all of the symptoms are gone. Have someone stay with you until you feel better. Medicines Take over-the-counter and prescription medicines only as told by your doctor. If you are taking blood pressure or heart medicine, sit up and stand up slowly. Spend a few minutes getting ready to sit and then stand. This can help you  feel less dizzy. Lifestyle Do not drive, use machinery, or play sports until your doctor says it is okay. Do not drink alcohol. Do not smoke or use any products that contain nicotine or tobacco. If you need help quitting, ask your doctor. Avoid hot tubs and saunas. General instructions Talk with your doctor about your symptoms. You may need to have  testing to help find the cause. Drink enough fluid to keep your pee (urine) pale yellow. Avoid standing for a long time. If you must stand for a long time, do movements such as: Moving your legs. Crossing your legs. Flexing and stretching your leg muscles. Squatting. Keep all follow-up visits. Contact a doctor if: You have episodes of near fainting. Get help right away if: You pass out or faint. You hit your head or are injured after fainting. You have any of these symptoms: Fast or uneven heartbeats (palpitations). Pain in your chest, belly, or back. Shortness of breath. You have jerky movements that you cannot control (seizure). You have a very bad headache. You are confused. You have problems with how you see (vision). You are very weak. You have trouble walking. You are bleeding from your mouth or your butt (rectum). You have black or tarry poop (stool). These symptoms may be an emergency. Get help right away. Call your local emergency services (911 in the [place] Do not wait to see if the symptoms will go away. Do not drive yourself to the hospital. Summary Syncope is when you pass out or faint for a short time. It is caused by a sudden decrease in blood flow to the brain. Signs that you may be about to faint include feeling dizzy or light-headed, feeling like you may vomit, seeing all white or all black, or having cold, clammy skin. If you start to feel like you might faint, sit or lie down right away. Lower your head if sitting, or raise (elevate) your feet if lying down. Breathe deeply and steadily. Wait until all of the symptoms are gone. This information is not intended to replace advice given to you by your health care provider. Make sure you discuss any questions you have with your health care provider. Document Revised: 10/24/2020 Document Reviewed: 10/24/2020 Elsevier Patient Education  2022 [place] for Falls Each year, millions of  people have serious injuries from falls. It is important to understand your risk for falling. Talk with your health care provider about your risk and what you can do to lower it. There are actions you can take at home to lower your risk and prevent falls. If you do have a serious fall, make sure to tell your health care provider. Falling once raises your risk of falling again. How can falls affect me? Serious injuries from falls are common. These include: Broken bones, such as hip fractures. Head injuries, such as traumatic brain injuries (TBI) or concussion. A fear of falling can cause you to avoid activities and stay at home. This can make your muscles weaker and actually raise your risk for a fall. What can increase my risk? There are a number of risk factors that increase your risk for falling. The more risk factors you have, the higher your risk of falling. Serious injuries from a fall happen most often to people older than age 65. Children and young adults ages 38-29 are also at higher risk. Common risk factors include: Weakness in the lower body. Lack (deficiency) of vitamin D. Being generally weak or  confused due to long-term (chronic) illness. Dizziness or balance problems. Poor vision. Medicines that cause dizziness or drowsiness. These can include medicines for your blood pressure, heart, anxiety, insomnia, or edema, as well as pain medicines and muscle relaxants. Other risk factors include: Drinking alcohol. Having had a fall in the past. Having depression. Having foot pain or wearing improper footwear. Working at a dangerous job. Having any of the following in your home: Tripping hazards, such as floor clutter or loose rugs. Poor lighting. Pets. Having dementia or memory loss. What actions can I take to lower my risk of falling?   Physical activity Maintain physical fitness. Do strength and balance exercises. Consider taking a regular class to build strength and balance.  Yoga and tai chi are good options. Vision Have your eyes checked every year and your vision prescription updated as needed. Walking aids and footwear Wear nonskid shoes. Do not wear high heels. Do not walk around the house in socks or slippers. Use a cane or walker as told by your health care provider. Home safety Attach secure railings on both sides of your stairs. Install grab bars for your tub, shower, and toilet. Use a bath mat in your tub or shower. Use good lighting in all rooms. Keep a flashlight near your bed. Make sure there is a clear path from your bed to the bathroom. Use night-lights. Do not use throw rugs. Make sure all carpeting is taped or tacked down securely. Remove all clutter from walkways and stairways, including extension cords. Repair uneven or broken steps. Avoid walking on icy or slippery surfaces. Walk on the grass instead of on icy or slick sidewalks. Use ice melt to get rid of ice on walkways. Use a cordless phone. Questions to ask your health care provider Can you help me check my risk for a fall? Do any of my medicines make me more likely to fall? Should I take a vitamin D supplement? What exercises can I do to improve my strength and balance? Should I make an appointment to have my vision checked? Do I need a bone density test to check for weak bones or osteoporosis? Would it help to use a cane or a walker? Where to find more information Centers for Disease Control and Prevention, [employer]: http://www.wolf.info/ Community-Based Fall Prevention Programs: http://www.wolf.info/ National Institute on Aging: http://kim-miller.com/ Contact a health care provider if: You fall at home. You are afraid of falling at home. You feel weak, drowsy, or dizzy. Summary Serious injuries from a fall happen most often to people older than age 83. Children and young adults ages 1-29 are also at higher risk. Talk with your health care provider about your risks for falling and how to lower those  risks. Taking certain precautions at home can lower your risk for falling. If you fall, always tell your health care provider. This information is not intended to replace advice given to you by your health care provider. Make sure you discuss any questions you have with your health care provider. Document Revised: 01/17/2020 Document Reviewed: 01/17/2020 Elsevier Patient Education  2022 [place] is a copy of your full care plan:  Care Plan : RN Care Manager Plan of Care  Updates made by [name], RN since 07/03/2021 12:00 AM     Problem: Chronic Disease Management Needs   Priority: High     Long-Range Goal: Development of plan of care for long term chronic disease management   Start Date: 07/02/2021  Expected  End Date: 07/02/2022  Priority: High  Note:   Current Barriers:  Chronic Disease Management support and education needs related to Atrial Fibrillation and HTN Recent hospitalization October 25-27, 2022 for AF with RVR-- 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 Frequent recent episodes syncope/ resulting in falls-- thus far without injury-- patient reports 16- plus falls over last few weeks, all without significant injury: patient has/ uses walker, cane, wheelchair [facility] to [facility]: is well-established with current counseling provider-- but would like to have counseling services in [place]: will place CCM CSW referral for same  [facility]- inconsistently wears hearing aids: does not like hearing aids, ambient noise-- encouraged to follow up with hearing aid provider to have adjustments as indicated Patient and spouse report that patient has had recent memory issues-- encouraged them to discuss with PCP at time of next office visit  RNCM Clinical Goal(s):  Patient will demonstrate ongoing health management independence as evidenced by adherence to plan of care for A-Fib/ HTN with hypotensive syncopal episodes, presumably due to A-F         through collaboration with RN Care manager, provider, and care team.   Interventions: 1:1 collaboration with primary care provider regarding development and update of comprehensive plan of care as evidenced by provider attestation and co-signature Inter-disciplinary care team collaboration (see longitudinal plan of care) Evaluation of current treatment plan related to  self management and patient's adherence to plan as established by provider Initial assessment completed 07/02/21 Pain assessment completed: denies acute/ chronic pain: reports occasional "discomfort" that easily manageable with conservative measures Falls assessment completed: HIGH fall risk; fall prevention education provided with specific strategies for fall prevention Depression screening completed: patient remains depressed as per baseline; currently established with counseling services; would like to transition counseling services to closer to his home: will place CCM CSW referral for support in this transition  A-fib:  (Status: New goal.) Long Term Goal  Advised patient to discuss cardiology provider responsibilities for pacemaker provider and "regular" cardiology; ongoing plan for follow up with provider Screening for signs and symptoms of depression related to chronic disease state Assessed social determinant of health barriers Recent hospital visit (October 2022) reviewed: patient verbalizes frustration understanding "which cardiologist does what;" reviewed roles of cardiology providers; reviewed 04/21/21 office visit with Dr. Marlou Porch with patient and spouse; encouraged patient to write a list of questions down to discuss with cardiology provider team; noted patient has not scheduled follow up office visit with Dr. Marlou Porch: wife reports schedule was not released at time of their October visit: encouraged them to put this on the calendar to schedule for April (6 month follow up recommended 04/21/21); confirmed they will attend  07/28/21 cardiology (pacer) office visit, as currently scheduled Discussed recent pacemaker insertion- report site healing well, confirm they plan to attend upcoming scheduled office visit with Dr. Curt Bears on 07/28/21 Initiated education around signs/ symptoms development A-Fib along corresponding action plan Discussed ongoing oncology provider office visits/ plan of care- patient/ spouse verbalize good understanding of same Reviewed upcoming scheduled provider office visits, confirmed they have plans at attend all as scheduled: 07/09/21: speech therapy/ [facility]; 07/14/21- oncology provider; 07/28/21- pacer check, Dr. Curt Bears  Hypertension: (Status: New goal.) Long Term Goal  Last practice recorded BP readings:  BP Readings from Last 3 Encounters:  06/26/21 136/86  06/16/21 (!) 142/92  06/12/21 132/82  Most recent eGFR/CrCl: No results found for: EGFR  No components found for: CRCL  Evaluation of current treatment plan related to  hypertension self management and patient's adherence to plan as established by provider;   Reviewed medications with patient and discussed importance of compliance;  Discussed plans with patient for ongoing care management follow up and provided patient with direct contact information for care management team; Discussed complications of poorly controlled blood pressure such as heart disease, stroke, circulatory complications, vision complications, kidney impairment, sexual dysfunction;  Reviewed PCP office visit 06/09/21: confirmed home health services that were ordered are now active: patient/ spouse report [facility] active for PT, speech therapy, RN, and personal care services: encouraged patient's ongoing active participation in all disciplines; confirmed they have contact information for home health team; encouraged their ongoing communication with home health team, including process for extension of services as time progresses Confirmed patient monitors blood  pressures daily at home: reports values between 129-162/80-110; discussed patient's multiple episodes of syncope: they have developed a plan that patient does not shower with hot water (uses lukewarm water), sits down during showering, spouse provides assistance/ supervision-- this has helped mitigate episodes of syncope during showering; he has also been using urinal during night time hours to prevent having to get out of bed and then having subsequent syncope; patient also mentions that he is trying to stay hydrated- all of these precautions were encouraged Provided education around mechanism of syncope is setting of hypotension and encouraged him to continue the practices they have developed at home to prevent syncope Reviewed medications: patient denies questions/ concerns around medications: spouse assists in medication management; they have developed a plan around timing of midodrine administration in the afternoon/ evening hours, based on afternoon blood pressure values- this has been working for them, and was encouraged; confirmed patient has been taken off blood pressure medications, as well as previously provided medications that can result in low blood pressure; patient/ spouse verbalize good general understanding of purpose, dosing, and scheduling of medications Confirmed patient has not yet scheduled follow up PCP appointment: discussed recommendation from last office visit 06/09/21 to follow up again in 3 months- encouraged spouse/ patient to schedule next visit accordingly- report they will do  Patient Goals/Self-Care Activities: As evidenced by review of EHR, collaboration with care team, and patient reporting during CCM RN CM outreach, Patient Alan [name] will: Take medications as prescribed Attend all scheduled provider appointments Call pharmacy for medication refills Call provider office for new concerns or questions Take effort to follow heart healthy, low salt, low cholesterol  diet Continue working with home health team and maintain communication with your home health team Continue your efforts to prevent falls and syncopal (fainting) episodes: continue using your cane/ walker/ wheelchair and taking the measures you have initiated at home to stay safe in the shower and at night when you wake up to go the bathroom I have asked the Clinical Social Worker at Dr. Ronnald Ramp' office to contact you by phone to help you transition your established counseling services to [facility], as you have requested: please listen out for a call from the [place] Work team      Consent to [facility] Services: Alan [name] was given information about Chronic Care Management 06/13/21 services including:  CCM service includes personalized support from designated clinical staff supervised by his physician, including individualized plan of care and coordination with other care providers 24/7 contact phone numbers for assistance for urgent and routine care needs. Service will only be billed when office clinical staff spend 20 minutes or more in a month to coordinate care. Only one practitioner may  furnish and bill the service in a calendar month. The patient may stop CCM services at any time (effective at the end of the month) by phone call to the office staff. The patient will be responsible for cost sharing (co-pay) of up to 20% of the service fee (after annual deductible is met).  Patient agreed to services 06/13/21 and verbal consent obtained.   Patient verbalizes understanding of instructions provided today and agrees to view in MyChart Telephone follow up appointment with care management team member scheduled for:  Thursday August 07, 2021 at 11:30 am The patient has been provided with contact information for the care management team and has been advised to call with any health related questions or concerns

## 2021-07-03 NOTE — Progress Notes (Signed)
Scheduled 07/15/21 with spouse.   Thanks   [employer]

## 2021-07-07 DIAGNOSIS — D630 Anemia in neoplastic disease: Secondary | ICD-10-CM | POA: Diagnosis not present

## 2021-07-07 DIAGNOSIS — I700 Atherosclerosis of aorta: Secondary | ICD-10-CM | POA: Diagnosis not present

## 2021-07-07 DIAGNOSIS — Z9181 History of falling: Secondary | ICD-10-CM | POA: Diagnosis not present

## 2021-07-07 DIAGNOSIS — I951 Orthostatic hypotension: Secondary | ICD-10-CM | POA: Diagnosis not present

## 2021-07-07 DIAGNOSIS — N1831 Chronic kidney disease, stage 3a: Secondary | ICD-10-CM | POA: Diagnosis not present

## 2021-07-07 DIAGNOSIS — Z950 Presence of cardiac pacemaker: Secondary | ICD-10-CM | POA: Diagnosis not present

## 2021-07-07 DIAGNOSIS — Z7982 Long term (current) use of aspirin: Secondary | ICD-10-CM | POA: Diagnosis not present

## 2021-07-07 DIAGNOSIS — D500 Iron deficiency anemia secondary to blood loss (chronic): Secondary | ICD-10-CM | POA: Diagnosis not present

## 2021-07-07 DIAGNOSIS — D631 Anemia in chronic kidney disease: Secondary | ICD-10-CM | POA: Diagnosis not present

## 2021-07-07 DIAGNOSIS — C7A019 Malignant carcinoid tumor of the small intestine, unspecified portion: Secondary | ICD-10-CM | POA: Diagnosis not present

## 2021-07-07 DIAGNOSIS — K912 Postsurgical malabsorption, not elsewhere classified: Secondary | ICD-10-CM | POA: Diagnosis not present

## 2021-07-07 DIAGNOSIS — I129 Hypertensive chronic kidney disease with stage 1 through stage 4 chronic kidney disease, or unspecified chronic kidney disease: Secondary | ICD-10-CM | POA: Diagnosis not present

## 2021-07-07 DIAGNOSIS — Z7952 Long term (current) use of systemic steroids: Secondary | ICD-10-CM | POA: Diagnosis not present

## 2021-07-08 ENCOUNTER — Telehealth: Payer: Self-pay | Admitting: Hematology

## 2021-07-08 NOTE — Telephone Encounter (Signed)
Sch per 1/9 inbasket, pt wife aware

## 2021-07-09 ENCOUNTER — Other Ambulatory Visit: Payer: Self-pay

## 2021-07-09 ENCOUNTER — Ambulatory Visit (HOSPITAL_COMMUNITY)
Admission: RE | Admit: 2021-07-09 | Discharge: 2021-07-09 | Disposition: A | Discharge status: Home / Self Care | Payer: Medicare Other | Source: Ambulatory Visit | Attending: Internal Medicine | Admitting: Internal Medicine

## 2021-07-09 DIAGNOSIS — R1312 Dysphagia, oropharyngeal phase: Secondary | ICD-10-CM

## 2021-07-09 DIAGNOSIS — R1310 Dysphagia, unspecified: Secondary | ICD-10-CM | POA: Diagnosis not present

## 2021-07-09 DIAGNOSIS — R6339 Other feeding difficulties: Secondary | ICD-10-CM | POA: Diagnosis not present

## 2021-07-10 DIAGNOSIS — I700 Atherosclerosis of aorta: Secondary | ICD-10-CM | POA: Diagnosis not present

## 2021-07-10 DIAGNOSIS — D630 Anemia in neoplastic disease: Secondary | ICD-10-CM | POA: Diagnosis not present

## 2021-07-10 DIAGNOSIS — C7A019 Malignant carcinoid tumor of the small intestine, unspecified portion: Secondary | ICD-10-CM | POA: Diagnosis not present

## 2021-07-10 DIAGNOSIS — I129 Hypertensive chronic kidney disease with stage 1 through stage 4 chronic kidney disease, or unspecified chronic kidney disease: Secondary | ICD-10-CM | POA: Diagnosis not present

## 2021-07-10 DIAGNOSIS — Z950 Presence of cardiac pacemaker: Secondary | ICD-10-CM | POA: Diagnosis not present

## 2021-07-10 DIAGNOSIS — Z9181 History of falling: Secondary | ICD-10-CM | POA: Diagnosis not present

## 2021-07-10 DIAGNOSIS — I951 Orthostatic hypotension: Secondary | ICD-10-CM | POA: Diagnosis not present

## 2021-07-10 DIAGNOSIS — K912 Postsurgical malabsorption, not elsewhere classified: Secondary | ICD-10-CM | POA: Diagnosis not present

## 2021-07-10 DIAGNOSIS — Z7952 Long term (current) use of systemic steroids: Secondary | ICD-10-CM | POA: Diagnosis not present

## 2021-07-10 DIAGNOSIS — D500 Iron deficiency anemia secondary to blood loss (chronic): Secondary | ICD-10-CM | POA: Diagnosis not present

## 2021-07-10 DIAGNOSIS — D631 Anemia in chronic kidney disease: Secondary | ICD-10-CM | POA: Diagnosis not present

## 2021-07-10 DIAGNOSIS — N1831 Chronic kidney disease, stage 3a: Secondary | ICD-10-CM | POA: Diagnosis not present

## 2021-07-10 DIAGNOSIS — Z7982 Long term (current) use of aspirin: Secondary | ICD-10-CM | POA: Diagnosis not present

## 2021-07-11 ENCOUNTER — Telehealth: Payer: Self-pay | Admitting: Cardiology

## 2021-07-11 ENCOUNTER — Telehealth: Payer: Self-pay | Admitting: Internal Medicine

## 2021-07-11 DIAGNOSIS — D630 Anemia in neoplastic disease: Secondary | ICD-10-CM | POA: Diagnosis not present

## 2021-07-11 DIAGNOSIS — I700 Atherosclerosis of aorta: Secondary | ICD-10-CM | POA: Diagnosis not present

## 2021-07-11 DIAGNOSIS — I951 Orthostatic hypotension: Secondary | ICD-10-CM | POA: Diagnosis not present

## 2021-07-11 DIAGNOSIS — D631 Anemia in chronic kidney disease: Secondary | ICD-10-CM | POA: Diagnosis not present

## 2021-07-11 DIAGNOSIS — I129 Hypertensive chronic kidney disease with stage 1 through stage 4 chronic kidney disease, or unspecified chronic kidney disease: Secondary | ICD-10-CM | POA: Diagnosis not present

## 2021-07-11 DIAGNOSIS — K912 Postsurgical malabsorption, not elsewhere classified: Secondary | ICD-10-CM | POA: Diagnosis not present

## 2021-07-11 DIAGNOSIS — N1831 Chronic kidney disease, stage 3a: Secondary | ICD-10-CM | POA: Diagnosis not present

## 2021-07-11 DIAGNOSIS — C7A019 Malignant carcinoid tumor of the small intestine, unspecified portion: Secondary | ICD-10-CM | POA: Diagnosis not present

## 2021-07-11 DIAGNOSIS — Z950 Presence of cardiac pacemaker: Secondary | ICD-10-CM | POA: Diagnosis not present

## 2021-07-11 DIAGNOSIS — Z7952 Long term (current) use of systemic steroids: Secondary | ICD-10-CM | POA: Diagnosis not present

## 2021-07-11 DIAGNOSIS — Z7982 Long term (current) use of aspirin: Secondary | ICD-10-CM | POA: Diagnosis not present

## 2021-07-11 DIAGNOSIS — Z9181 History of falling: Secondary | ICD-10-CM | POA: Diagnosis not present

## 2021-07-11 DIAGNOSIS — D500 Iron deficiency anemia secondary to blood loss (chronic): Secondary | ICD-10-CM | POA: Diagnosis not present

## 2021-07-11 NOTE — Telephone Encounter (Signed)
[place] Name: [facility] Agency Name: Santina Evans Phone #: [phone] Service Requested: PT extension Frequency of Visits: 2 week 3 (starting next week)

## 2021-07-11 NOTE — Telephone Encounter (Signed)
Please see below notes...sent note before thenf finishing it.

## 2021-07-11 NOTE — Telephone Encounter (Signed)
Spoke to [place] and patients wife, explained patient has hx of AF and can cause the numbers on the pulse ox to vary due to irregular beats. Advised also if he has PVC's this can cause vary in beats. Explained that the pacemaker will kick in when needed and not let his heart rate go below 60 bpm. No alerts noted in [place] since 06/04/21. Advised to f/u with his pcp considering his symptoms.  Advised if further questions or concerns arise to please reach out. Verbalized understanding and appreciative of call.

## 2021-07-11 NOTE — Telephone Encounter (Signed)
Verbal orders given  

## 2021-07-11 NOTE — Telephone Encounter (Signed)
STAT if HR is under 50 or over 120 (normal HR is 60-100 beats per minute)  What is your heart rate? 47,48 two nights ago but now its 65  Do you have a log of your heart rate readings (document readings)?    Do you have any other symptoms? He had fatigue and headaches

## 2021-07-11 NOTE — Telephone Encounter (Signed)
Agree.  If there are PVCs present or atrial fibrillation, blood pressure machines have difficulty picking up every beat and sometimes give a slow heart rates that are incorrect.  Pacemaker will not allow for dangerously low heart rate. Candee Furbish, MD

## 2021-07-11 NOTE — Telephone Encounter (Signed)
Glenard Haring [facility]) [phone]  called to re[ports that the pt had diarrhea a few days ago and had some fatigue and weakness associated with it... his BP and HR 07/09/21 was 106/62 HR 41 and 109/79 HR 48.... today after drinking added fluids he is feeling better BP 120/80 HR 65.   She is concerned that his HR was able to drop that low with his Pacer in place... implant 03/2021.   I advised her to talk with him about maintaining fluid levels and to continue to monitor... I weill forward to our device nurses for review.

## 2021-07-14 ENCOUNTER — Inpatient Hospital Stay: Payer: Medicare Other

## 2021-07-14 ENCOUNTER — Ambulatory Visit: Payer: Medicare Other | Admitting: Hematology

## 2021-07-14 ENCOUNTER — Other Ambulatory Visit: Payer: Medicare Other

## 2021-07-15 ENCOUNTER — Inpatient Hospital Stay: Payer: Medicare Other

## 2021-07-15 ENCOUNTER — Ambulatory Visit: Payer: Medicare Other | Admitting: Licensed Clinical Social Worker

## 2021-07-15 ENCOUNTER — Other Ambulatory Visit: Payer: Self-pay

## 2021-07-15 VITALS — BP 148/98 | HR 80 | Temp 98.0°F | Resp 16

## 2021-07-15 DIAGNOSIS — C7A019 Malignant carcinoid tumor of the small intestine, unspecified portion: Secondary | ICD-10-CM

## 2021-07-15 DIAGNOSIS — E876 Hypokalemia: Secondary | ICD-10-CM | POA: Diagnosis not present

## 2021-07-15 DIAGNOSIS — R197 Diarrhea, unspecified: Secondary | ICD-10-CM | POA: Diagnosis not present

## 2021-07-15 DIAGNOSIS — C7A098 Malignant carcinoid tumors of other sites: Secondary | ICD-10-CM | POA: Diagnosis not present

## 2021-07-15 DIAGNOSIS — I10 Essential (primary) hypertension: Secondary | ICD-10-CM

## 2021-07-15 DIAGNOSIS — F329 Major depressive disorder, single episode, unspecified: Secondary | ICD-10-CM

## 2021-07-15 LAB — CMP (CANCER CENTER ONLY)
ALT: 17 U/L (ref 0–44)
AST: 16 U/L (ref 15–41)
Albumin: 4 g/dL (ref 3.5–5.0)
Alkaline Phosphatase: 67 U/L (ref 38–126)
Anion gap: 8 (ref 5–15)
BUN: 31 mg/dL — ABNORMAL HIGH (ref 8–23)
CO2: 25 mmol/L (ref 22–32)
Calcium: 9.7 mg/dL (ref 8.9–10.3)
Chloride: 109 mmol/L (ref 98–111)
Creatinine: 1.76 mg/dL — ABNORMAL HIGH (ref 0.61–1.24)
GFR, Estimated: 39 mL/min — ABNORMAL LOW (ref >60)
Glucose, Bld: 131 mg/dL — ABNORMAL HIGH (ref 70–99)
Potassium: 3.9 mmol/L (ref 3.5–5.1)
Sodium: 142 mmol/L (ref 135–145)
Total Bilirubin: 0.6 mg/dL (ref 0.3–1.2)
Total Protein: 7.3 g/dL (ref 6.5–8.1)

## 2021-07-15 LAB — CBC WITH DIFFERENTIAL (CANCER CENTER ONLY)
Abs Immature Granulocytes: 0.01 10*3/uL (ref 0.00–0.07)
Basophils Absolute: 0 10*3/uL (ref 0.0–0.1)
Basophils Relative: 0 %
Eosinophils Absolute: 0.1 10*3/uL (ref 0.0–0.5)
Eosinophils Relative: 2 %
HCT: 32.5 % — ABNORMAL LOW (ref 39.0–52.0)
Hemoglobin: 11.4 g/dL — ABNORMAL LOW (ref 13.0–17.0)
Immature Granulocytes: 0 %
Lymphocytes Relative: 25 %
Lymphs Abs: 1.9 10*3/uL (ref 0.7–4.0)
MCH: 31.2 pg (ref 26.0–34.0)
MCHC: 35.1 g/dL (ref 30.0–36.0)
MCV: 89 fL (ref 80.0–100.0)
Monocytes Absolute: 0.7 10*3/uL (ref 0.1–1.0)
Monocytes Relative: 9 %
Neutro Abs: 4.7 10*3/uL (ref 1.7–7.7)
Neutrophils Relative %: 64 %
Platelet Count: 198 10*3/uL (ref 150–400)
RBC: 3.65 MIL/uL — ABNORMAL LOW (ref 4.22–5.81)
RDW: 13.3 % (ref 11.5–15.5)
WBC Count: 7.5 10*3/uL (ref 4.0–10.5)
nRBC: 0 % (ref 0.0–0.2)

## 2021-07-15 MED ORDER — LANREOTIDE ACETATE 120 MG/0.5ML ~~LOC~~ SOLN
120.0000 mg | Freq: Once | SUBCUTANEOUS | Status: AC
  Administered 2021-07-15: 120 mg via SUBCUTANEOUS
  Filled 2021-07-15: qty 120

## 2021-07-15 NOTE — Chronic Care Management (AMB) (Signed)
Chronic Care Management    Clinical Social Work Note  07/15/2021 Name: Alan [name] MRN: [id number] DOB: [birth year]  Alan [name] is a 81 y.o. year old male who is a primary care patient of [name], MD. The CCM team was consulted to assist the patient with chronic disease management and/or care coordination needs related to: [place] and Resources.   Engaged with patient by telephone for initial visit in response to provider referral for social work chronic care management and care coordination services.   Consent to Services:  The patient was given the following information about Chronic Care Management services today, agreed to services, and gave verbal consent: 1. CCM service includes personalized support from designated clinical staff supervised by the primary care provider, including individualized plan of care and coordination with other care providers 2. 24/7 contact phone numbers for assistance for urgent and routine care needs. 3. Service will only be billed when office clinical staff spend 20 minutes or more in a month to coordinate care. 4. Only one practitioner may furnish and bill the service in a calendar month. 5.The patient may stop CCM services at any time (effective at the end of the month) by phone call to the office staff. 6. The patient will be responsible for cost sharing (co-pay) of up to 20% of the service fee (after annual deductible is met). Patient agreed to services and consent obtained.  Patient agreed to services and consent obtained.   Summary: Assessed patient's previous and current treatment, coping skills, support system and barriers to care. Patient was accompanied by his wife who provided information during this encounter. He continues to experience difficulty with managing his symptoms. He is currently experiencing symptoms of  anxiety and depression which seems to be exacerbated by not being able to do the things he [place] to enjoy doing  and change in health condition. He is currently seeing a therapist that is focus on the marriage..  See Care Plan below for interventions and patient self-care actives.  Recommendation: Patient may benefit from, and is in agreement to connect with a therapist that will focus on assisting with managing his symptoms of depression. Referral made to [facility].   Follow up Plan: Patient would like continued follow-up from CCM LCSW.  per patient's request will follow up in 4 weeks.  Will call office if needed prior to next encounter.   Assessment: Review of patient past medical history, allergies, medications, and health status, including review of relevant consultants reports was performed today as part of a comprehensive evaluation and provision of chronic care management and care coordination services.     SDOH (Social Determinants of Health) assessments and interventions performed:  SDOH Interventions    Flowsheet Row Most Recent Value  SDOH Interventions   Stress Interventions Other (Comment)  [making referral to counseling]        Advanced Directives Status: Not addressed in this encounter.  CCM Care Plan  Conditions to be addressed/monitored: Anxiety and Depression;   Care Plan : LCSW Plan of Care  Updates made by Maurine Cane, LCSW since 07/15/2021 12:00 AM     Problem: Coping Skills      Goal: Coping Skills Enhanced by connecting with ongoing therapy   Start Date: 07/15/2021  Expected End Date: 09/26/2021  This Visit's Progress: On track  Priority: High  Note:   Current Barriers:  Disease Management support and education needs related to managing symptoms of depression Lacks knowledge of  how to connect   [place]):  Patient  will work with Education officer, museum to address concerns related to managing symptoms of depression until connected for ongoing therapy will work with [employer] to address needs related to depression  through  collaboration with [employer] representative, provider, and care team.   Interventions: 1:1 collaboration with primary care provider regarding development and update of comprehensive plan of care as evidenced by provider attestation and co-signature Inter-disciplinary care team collaboration (see longitudinal plan of care) Evaluation of current treatment plan related to  self management and patient's adherence to plan as established by provider Review resources, discussed options and provided patient information about  Options for mental health treatment based on need and insurance  Mental Health:  (Status: New goal.) Evaluation of current treatment plan related to Depression: depressed mood anxiety Active listening / Reflection utilized  Provided psychoeducation for mental health needs  Provided EMMI education information on Relieving Stress Made referral to [facility]  Patient Self-Care Activities: I have placed a referral with [facility] [phone]  they will contact you.   Review your EMMI educational information (Relieving stress) Look for an e-mail from [facility]      Casimer Lanius, LCSW Licensed Clinical Social Worker [facility]  [place]  [phone]

## 2021-07-15 NOTE — Patient Instructions (Signed)
Visit Information   Thank you for taking time to visit with me today. Please don't hesitate to contact me if I can be of assistance to you before our next scheduled telephone appointment.  Following are the goals we discussed today: Getting you connected for ongoing therapy   Our next appointment is by telephone on Feb 14th at 11:00  Please call the care guide team at [phone] if you need to cancel or reschedule your appointment.   If you are experiencing a Mental Health or [place] or need someone to talk to, please call the [place] National Suicide Prevention Lifeline: 938-325-4420 or TTY: (920)115-9162 TTY 2540613621) to talk to a trained counselor call 1-800-273-TALK (toll free, 24 hour hotline) go to [facility] [address], [place] [phone]) call 911   Following is a copy of your full care plan:  Care Plan : LCSW Plan of Care  Updates made by Maurine Cane, LCSW since 07/15/2021 12:00 AM     Problem: Coping Skills      Goal: Coping Skills Enhanced by connecting with ongoing therapy   Start Date: 07/15/2021  Expected End Date: 09/26/2021  This Visit's Progress: On track  Priority: High  Note:   Current Barriers:  Disease Management support and education needs related to managing symptoms of depression Lacks knowledge of how to connect   [place]):  Patient  will work with Education officer, museum to address concerns related to managing symptoms of depression until connected for ongoing therapy will work with [employer] to address needs related to depression  through collaboration with [employer] representative, provider, and care team.   Interventions: 1:1 collaboration with primary care provider regarding development and update of comprehensive plan of care as evidenced by provider attestation and co-signature Inter-disciplinary care team collaboration (see longitudinal plan of  care) Evaluation of current treatment plan related to  self management and patient's adherence to plan as established by provider Review resources, discussed options and provided patient information about  Options for mental health treatment based on need and insurance  Mental Health:  (Status: New goal.) Evaluation of current treatment plan related to Depression: depressed mood anxiety Active listening / Reflection utilized  Provided psychoeducation for mental health needs  Provided EMMI education information on Relieving Stress Made referral to [facility]  Patient Self-Care Activities: I have placed a referral with [facility] [phone]  they will contact you.   Review your EMMI educational information (Relieving stress) Look for an e-mail from [facility].     Consent to CCM Services: Mr. Rokosz was given information about Chronic Care Management services including:  CCM service includes personalized support from designated clinical staff supervised by his physician, including individualized plan of care and coordination with other care providers 24/7 contact phone numbers for assistance for urgent and routine care needs. Service will only be billed when office clinical staff spend 20 minutes or more in a month to coordinate care. Only one practitioner may furnish and bill the service in a calendar month. The patient may stop CCM services at any time (effective at the end of the month) by phone call to the office staff. The patient will be responsible for cost sharing (co-pay) of up to 20% of the service fee (after annual deductible is met).  Patient agreed to services and verbal consent obtained.   Patient verbalizes understanding of instructions and care plan provided today and agrees to view  in [place]. Active MyChart status confirmed with patient.    Casimer Lanius, LCSW Licensed Clinical Social Worker [facility]  [place]  [place]  [phone]

## 2021-07-16 DIAGNOSIS — Z9181 History of falling: Secondary | ICD-10-CM | POA: Diagnosis not present

## 2021-07-16 DIAGNOSIS — I700 Atherosclerosis of aorta: Secondary | ICD-10-CM | POA: Diagnosis not present

## 2021-07-16 DIAGNOSIS — D500 Iron deficiency anemia secondary to blood loss (chronic): Secondary | ICD-10-CM | POA: Diagnosis not present

## 2021-07-16 DIAGNOSIS — Z7952 Long term (current) use of systemic steroids: Secondary | ICD-10-CM | POA: Diagnosis not present

## 2021-07-16 DIAGNOSIS — I951 Orthostatic hypotension: Secondary | ICD-10-CM | POA: Diagnosis not present

## 2021-07-16 DIAGNOSIS — Z7982 Long term (current) use of aspirin: Secondary | ICD-10-CM | POA: Diagnosis not present

## 2021-07-16 DIAGNOSIS — N1831 Chronic kidney disease, stage 3a: Secondary | ICD-10-CM | POA: Diagnosis not present

## 2021-07-16 DIAGNOSIS — D630 Anemia in neoplastic disease: Secondary | ICD-10-CM | POA: Diagnosis not present

## 2021-07-16 DIAGNOSIS — Z950 Presence of cardiac pacemaker: Secondary | ICD-10-CM | POA: Diagnosis not present

## 2021-07-16 DIAGNOSIS — D631 Anemia in chronic kidney disease: Secondary | ICD-10-CM | POA: Diagnosis not present

## 2021-07-16 DIAGNOSIS — C7A019 Malignant carcinoid tumor of the small intestine, unspecified portion: Secondary | ICD-10-CM | POA: Diagnosis not present

## 2021-07-16 DIAGNOSIS — K912 Postsurgical malabsorption, not elsewhere classified: Secondary | ICD-10-CM | POA: Diagnosis not present

## 2021-07-16 DIAGNOSIS — I129 Hypertensive chronic kidney disease with stage 1 through stage 4 chronic kidney disease, or unspecified chronic kidney disease: Secondary | ICD-10-CM | POA: Diagnosis not present

## 2021-07-17 NOTE — Telephone Encounter (Signed)
Signed by PCP and faxed back

## 2021-07-17 NOTE — Telephone Encounter (Signed)
Wanda/ w [facility] checking status of 3 faxed order request   order for [facility] certification and plan of care from 06-13-2021 through 08-11-2021  Order for a medical social worker  Order to change of PT to 2w1 per patient's request  Rep sates orders was last faxed on 07-10-2021

## 2021-07-18 ENCOUNTER — Telehealth: Payer: Self-pay

## 2021-07-18 DIAGNOSIS — D631 Anemia in chronic kidney disease: Secondary | ICD-10-CM | POA: Diagnosis not present

## 2021-07-18 DIAGNOSIS — Z9181 History of falling: Secondary | ICD-10-CM | POA: Diagnosis not present

## 2021-07-18 DIAGNOSIS — Z950 Presence of cardiac pacemaker: Secondary | ICD-10-CM | POA: Diagnosis not present

## 2021-07-18 DIAGNOSIS — I700 Atherosclerosis of aorta: Secondary | ICD-10-CM | POA: Diagnosis not present

## 2021-07-18 DIAGNOSIS — K912 Postsurgical malabsorption, not elsewhere classified: Secondary | ICD-10-CM | POA: Diagnosis not present

## 2021-07-18 DIAGNOSIS — I951 Orthostatic hypotension: Secondary | ICD-10-CM | POA: Diagnosis not present

## 2021-07-18 DIAGNOSIS — I129 Hypertensive chronic kidney disease with stage 1 through stage 4 chronic kidney disease, or unspecified chronic kidney disease: Secondary | ICD-10-CM | POA: Diagnosis not present

## 2021-07-18 DIAGNOSIS — N1831 Chronic kidney disease, stage 3a: Secondary | ICD-10-CM | POA: Diagnosis not present

## 2021-07-18 DIAGNOSIS — Z7982 Long term (current) use of aspirin: Secondary | ICD-10-CM | POA: Diagnosis not present

## 2021-07-18 DIAGNOSIS — C7A019 Malignant carcinoid tumor of the small intestine, unspecified portion: Secondary | ICD-10-CM | POA: Diagnosis not present

## 2021-07-18 DIAGNOSIS — D630 Anemia in neoplastic disease: Secondary | ICD-10-CM | POA: Diagnosis not present

## 2021-07-18 DIAGNOSIS — D500 Iron deficiency anemia secondary to blood loss (chronic): Secondary | ICD-10-CM | POA: Diagnosis not present

## 2021-07-18 DIAGNOSIS — Z7952 Long term (current) use of systemic steroids: Secondary | ICD-10-CM | POA: Diagnosis not present

## 2021-07-18 NOTE — Telephone Encounter (Signed)
[name], PT with [facility] is calling to inform pts PCP of pts low BP dizziness and lightheaded.  102/52 was the pts last reading on this morning.

## 2021-07-21 DIAGNOSIS — Z7982 Long term (current) use of aspirin: Secondary | ICD-10-CM | POA: Diagnosis not present

## 2021-07-21 DIAGNOSIS — Z7952 Long term (current) use of systemic steroids: Secondary | ICD-10-CM | POA: Diagnosis not present

## 2021-07-21 DIAGNOSIS — D500 Iron deficiency anemia secondary to blood loss (chronic): Secondary | ICD-10-CM | POA: Diagnosis not present

## 2021-07-21 DIAGNOSIS — Z9181 History of falling: Secondary | ICD-10-CM | POA: Diagnosis not present

## 2021-07-21 DIAGNOSIS — K912 Postsurgical malabsorption, not elsewhere classified: Secondary | ICD-10-CM | POA: Diagnosis not present

## 2021-07-21 DIAGNOSIS — R197 Diarrhea, unspecified: Secondary | ICD-10-CM | POA: Diagnosis not present

## 2021-07-21 DIAGNOSIS — I951 Orthostatic hypotension: Secondary | ICD-10-CM | POA: Diagnosis not present

## 2021-07-21 DIAGNOSIS — C7A019 Malignant carcinoid tumor of the small intestine, unspecified portion: Secondary | ICD-10-CM | POA: Diagnosis not present

## 2021-07-21 DIAGNOSIS — D631 Anemia in chronic kidney disease: Secondary | ICD-10-CM | POA: Diagnosis not present

## 2021-07-21 DIAGNOSIS — I129 Hypertensive chronic kidney disease with stage 1 through stage 4 chronic kidney disease, or unspecified chronic kidney disease: Secondary | ICD-10-CM | POA: Diagnosis not present

## 2021-07-21 DIAGNOSIS — I700 Atherosclerosis of aorta: Secondary | ICD-10-CM | POA: Diagnosis not present

## 2021-07-21 DIAGNOSIS — N1831 Chronic kidney disease, stage 3a: Secondary | ICD-10-CM | POA: Diagnosis not present

## 2021-07-21 DIAGNOSIS — D630 Anemia in neoplastic disease: Secondary | ICD-10-CM | POA: Diagnosis not present

## 2021-07-21 DIAGNOSIS — K6289 Other specified diseases of anus and rectum: Secondary | ICD-10-CM | POA: Diagnosis not present

## 2021-07-21 DIAGNOSIS — K648 Other hemorrhoids: Secondary | ICD-10-CM | POA: Diagnosis not present

## 2021-07-21 DIAGNOSIS — Z950 Presence of cardiac pacemaker: Secondary | ICD-10-CM | POA: Diagnosis not present

## 2021-07-23 DIAGNOSIS — Z7982 Long term (current) use of aspirin: Secondary | ICD-10-CM | POA: Diagnosis not present

## 2021-07-23 DIAGNOSIS — N1831 Chronic kidney disease, stage 3a: Secondary | ICD-10-CM | POA: Diagnosis not present

## 2021-07-23 DIAGNOSIS — D500 Iron deficiency anemia secondary to blood loss (chronic): Secondary | ICD-10-CM | POA: Diagnosis not present

## 2021-07-23 DIAGNOSIS — Z7952 Long term (current) use of systemic steroids: Secondary | ICD-10-CM | POA: Diagnosis not present

## 2021-07-23 DIAGNOSIS — D630 Anemia in neoplastic disease: Secondary | ICD-10-CM | POA: Diagnosis not present

## 2021-07-23 DIAGNOSIS — I700 Atherosclerosis of aorta: Secondary | ICD-10-CM | POA: Diagnosis not present

## 2021-07-23 DIAGNOSIS — I951 Orthostatic hypotension: Secondary | ICD-10-CM | POA: Diagnosis not present

## 2021-07-23 DIAGNOSIS — Z9181 History of falling: Secondary | ICD-10-CM | POA: Diagnosis not present

## 2021-07-23 DIAGNOSIS — K912 Postsurgical malabsorption, not elsewhere classified: Secondary | ICD-10-CM | POA: Diagnosis not present

## 2021-07-23 DIAGNOSIS — Z950 Presence of cardiac pacemaker: Secondary | ICD-10-CM | POA: Diagnosis not present

## 2021-07-23 DIAGNOSIS — D631 Anemia in chronic kidney disease: Secondary | ICD-10-CM | POA: Diagnosis not present

## 2021-07-23 DIAGNOSIS — C7A019 Malignant carcinoid tumor of the small intestine, unspecified portion: Secondary | ICD-10-CM | POA: Diagnosis not present

## 2021-07-23 DIAGNOSIS — I129 Hypertensive chronic kidney disease with stage 1 through stage 4 chronic kidney disease, or unspecified chronic kidney disease: Secondary | ICD-10-CM | POA: Diagnosis not present

## 2021-07-24 ENCOUNTER — Ambulatory Visit (INDEPENDENT_AMBULATORY_CARE_PROVIDER_SITE_OTHER): Payer: Medicare Other

## 2021-07-24 DIAGNOSIS — N1831 Chronic kidney disease, stage 3a: Secondary | ICD-10-CM | POA: Diagnosis not present

## 2021-07-24 DIAGNOSIS — Z9181 History of falling: Secondary | ICD-10-CM | POA: Diagnosis not present

## 2021-07-24 DIAGNOSIS — I700 Atherosclerosis of aorta: Secondary | ICD-10-CM | POA: Diagnosis not present

## 2021-07-24 DIAGNOSIS — Z950 Presence of cardiac pacemaker: Secondary | ICD-10-CM | POA: Diagnosis not present

## 2021-07-24 DIAGNOSIS — I129 Hypertensive chronic kidney disease with stage 1 through stage 4 chronic kidney disease, or unspecified chronic kidney disease: Secondary | ICD-10-CM | POA: Diagnosis not present

## 2021-07-24 DIAGNOSIS — I441 Atrioventricular block, second degree: Secondary | ICD-10-CM | POA: Diagnosis not present

## 2021-07-24 DIAGNOSIS — D630 Anemia in neoplastic disease: Secondary | ICD-10-CM | POA: Diagnosis not present

## 2021-07-24 DIAGNOSIS — K912 Postsurgical malabsorption, not elsewhere classified: Secondary | ICD-10-CM | POA: Diagnosis not present

## 2021-07-24 DIAGNOSIS — D631 Anemia in chronic kidney disease: Secondary | ICD-10-CM | POA: Diagnosis not present

## 2021-07-24 DIAGNOSIS — I951 Orthostatic hypotension: Secondary | ICD-10-CM | POA: Diagnosis not present

## 2021-07-24 DIAGNOSIS — Z7952 Long term (current) use of systemic steroids: Secondary | ICD-10-CM | POA: Diagnosis not present

## 2021-07-24 DIAGNOSIS — D500 Iron deficiency anemia secondary to blood loss (chronic): Secondary | ICD-10-CM | POA: Diagnosis not present

## 2021-07-24 DIAGNOSIS — Z7982 Long term (current) use of aspirin: Secondary | ICD-10-CM | POA: Diagnosis not present

## 2021-07-24 DIAGNOSIS — C7A019 Malignant carcinoid tumor of the small intestine, unspecified portion: Secondary | ICD-10-CM | POA: Diagnosis not present

## 2021-07-24 LAB — CUP PACEART REMOTE DEVICE CHECK
Date Time Interrogation Session: 20230126073112
Implantable Lead Implant Date: 20221026
Implantable Lead Implant Date: 20221026
Implantable Lead Location: 753859
Implantable Lead Location: 753860
Implantable Lead Model: 377169
Implantable Lead Model: 377169
Implantable Lead Serial Number: 8000563684
Implantable Lead Serial Number: 8000606646
Implantable Pulse Generator Implant Date: 20221026
Pulse Gen Model: 407145
Pulse Gen Serial Number: 70247401

## 2021-07-28 ENCOUNTER — Encounter: Payer: Self-pay | Admitting: Cardiology

## 2021-07-28 ENCOUNTER — Ambulatory Visit: Payer: Medicare Other | Admitting: Cardiology

## 2021-07-28 ENCOUNTER — Other Ambulatory Visit: Payer: Self-pay

## 2021-07-28 ENCOUNTER — Inpatient Hospital Stay: Payer: Medicare Other | Admitting: Hematology

## 2021-07-28 VITALS — BP 128/80 | HR 72 | Ht 73.0 in | Wt 139.2 lb

## 2021-07-28 DIAGNOSIS — Z950 Presence of cardiac pacemaker: Secondary | ICD-10-CM | POA: Diagnosis not present

## 2021-07-28 DIAGNOSIS — D500 Iron deficiency anemia secondary to blood loss (chronic): Secondary | ICD-10-CM | POA: Diagnosis not present

## 2021-07-28 DIAGNOSIS — I951 Orthostatic hypotension: Secondary | ICD-10-CM | POA: Diagnosis not present

## 2021-07-28 DIAGNOSIS — I700 Atherosclerosis of aorta: Secondary | ICD-10-CM | POA: Diagnosis not present

## 2021-07-28 DIAGNOSIS — I441 Atrioventricular block, second degree: Secondary | ICD-10-CM

## 2021-07-28 DIAGNOSIS — I480 Paroxysmal atrial fibrillation: Secondary | ICD-10-CM | POA: Diagnosis not present

## 2021-07-28 DIAGNOSIS — I495 Sick sinus syndrome: Secondary | ICD-10-CM

## 2021-07-28 DIAGNOSIS — Z9181 History of falling: Secondary | ICD-10-CM | POA: Diagnosis not present

## 2021-07-28 DIAGNOSIS — Z7952 Long term (current) use of systemic steroids: Secondary | ICD-10-CM | POA: Diagnosis not present

## 2021-07-28 DIAGNOSIS — D630 Anemia in neoplastic disease: Secondary | ICD-10-CM | POA: Diagnosis not present

## 2021-07-28 DIAGNOSIS — N1831 Chronic kidney disease, stage 3a: Secondary | ICD-10-CM | POA: Diagnosis not present

## 2021-07-28 DIAGNOSIS — C7A019 Malignant carcinoid tumor of the small intestine, unspecified portion: Secondary | ICD-10-CM | POA: Diagnosis not present

## 2021-07-28 DIAGNOSIS — I129 Hypertensive chronic kidney disease with stage 1 through stage 4 chronic kidney disease, or unspecified chronic kidney disease: Secondary | ICD-10-CM | POA: Diagnosis not present

## 2021-07-28 DIAGNOSIS — K912 Postsurgical malabsorption, not elsewhere classified: Secondary | ICD-10-CM | POA: Diagnosis not present

## 2021-07-28 DIAGNOSIS — D631 Anemia in chronic kidney disease: Secondary | ICD-10-CM | POA: Diagnosis not present

## 2021-07-28 DIAGNOSIS — Z7982 Long term (current) use of aspirin: Secondary | ICD-10-CM | POA: Diagnosis not present

## 2021-07-28 NOTE — Progress Notes (Signed)
Electrophysiology Office Note   Date:  07/28/2021   ID:  Alan [name], DOB 1940/08/14, MRN [id number]  PCP:  [name], MD  Cardiologist:  Marlou Porch Primary Electrophysiologist:  Ura Yingling Meredith Leeds, MD    Chief Complaint: pacemaker   History of Present Illness: Alan [name] is a 81 y.o. male who is being seen today for the evaluation of pacemaker at the request of [name], MD. Presenting today for electrophysiology evaluation.  He has a history significant for malignant carcinoid tumor, chronic diarrhea, CKD stage IIIb, hypertension with orthostatic hypotension and syncope, short gut syndrome, PE not anticoagulated due to GI bleeding, and tachybradycardia syndrome.  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  Today, he denies symptoms of palpitations, chest pain, shortness of breath, orthopnea, PND, lower extremity edema, claudication, dizziness, presyncope, syncope, bleeding, or neurologic sequela. The patient is tolerating medications without difficulties.  Since his device was implanted he has done well.  He is also made adjustments to his midodrine.  He is taking it at different times which has reduced his orthostatic symptoms.  He has not had a fall since December.   Past Medical History:  Diagnosis Date   Asthma    Carcinoid tumor, small intestine, malignant (HCC)    Chronic diarrhea    CKD (chronic kidney disease) stage 3, GFR 30-59 ml/min (HCC)    HTN (hypertension)    Short gut syndrome    Past Surgical History:  Procedure Laterality Date   COLON SURGERY     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   Procedure: PACEMAKER IMPLANT;  Surgeon: Constance Haw, MD;  Location: [facility];  Service: Cardiovascular;  Laterality: N/A;     Current Outpatient Medications  Medication Sig Dispense Refill   aspirin 81 MG EC tablet Take 81 mg by mouth daily. Swallow whole.     Ensure Plus (ENSURE PLUS) LIQD Take 237 mLs by mouth  2 (two) times daily between meals.     fludrocortisone (FLORINEF) 0.1 MG tablet Take 1 tablet (0.1 mg total) by mouth daily. 90 tablet 3   iron polysaccharides (NIFEREX) 150 MG capsule Take 1 capsule (150 mg total) by mouth 3 (three) times a week. 30 capsule 2   LANREOTIDE ACETATE [place] Inject 1 Dose into the skin every 30 (thirty) days.     midodrine (PROAMATINE) 10 MG tablet Take 1 tablet (10 mg total) by mouth 3 (three) times daily. 270 tablet 3   mirtazapine (REMERON) 7.5 MG tablet Take 7.5 mg by mouth daily.     Multiple Vitamin (MULTIVITAMIN WITH MINERALS) TABS Take 1 tablet by mouth daily.     Multiple Vitamins-Minerals (PRESERVISION AREDS 2 PO) Take 1 capsule by mouth daily.     multivitamin (ONE-A-DAY MEN'S) TABS tablet 1 tab     potassium chloride SA (KLOR-CON M) 20 MEQ tablet 61meq three times a day po for 3 days then 31meq po BID 60 tablet 1   SYMBICORT 160-4.5 MCG/ACT inhaler Inhale 2 puffs into the lungs daily as needed (wheezing).     Vitamin D, Ergocalciferol, (DRISDOL) 1.25 MG (50000 UNIT) CAPS capsule Take 50,000 Units by mouth every 7 (seven) days.     No current facility-administered medications for this visit.    Allergies:   Penicillins and Lisinopril   Social History:  The patient  reports that he quit smoking about 16 months ago. His smoking use included cigarettes. He started smoking about 63 years ago. He has a  18.30 pack-year smoking history. He has never used smokeless tobacco. He reports that he does not currently use alcohol. He reports that he does not use drugs.   Family History:  The patient's family history includes Alzheimer's disease in his mother.    ROS:  Please see the history of present illness.   Otherwise, review of systems is positive for none.   All other systems are reviewed and negative.    PHYSICAL EXAM: VS:  BP 128/80    Pulse 72    Ht 6\' 1"  (1.854 m)    Wt 139 lb 3.2 oz (63.1 kg)    SpO2 94%    BMI 18.37 kg/m  , BMI Body mass index is 18.37  kg/m. GEN: Well nourished, well developed, in no acute distress  HEENT: normal  Neck: no JVD, carotid bruits, or masses Cardiac: RRR; no murmurs, rubs, or gallops,no edema  Respiratory:  clear to auscultation bilaterally, normal work of breathing GI: soft, nontender, nondistended, + BS MS: no deformity or atrophy  Skin: warm and dry, device pocket is well healed Neuro:  Strength and sensation are intact Psych: euthymic mood, full affect  EKG:  EKG is ordered today. Personal review of the ekg ordered shows atrial paced, rate 72, inferior T wave inversions  Device interrogation is reviewed today in detail.  See PaceArt for details.   Recent Labs: 04/22/2021: TSH 1.591 06/26/2021: Magnesium 1.9 07/15/2021: ALT 17; BUN 31; Creatinine 1.76; Hemoglobin 11.4; Platelet Count 198; Potassium 3.9; Sodium 142    Lipid Panel  No results found for: CHOL, TRIG, HDL, CHOLHDL, VLDL, LDLCALC, LDLDIRECT   Wt Readings from Last 3 Encounters:  07/28/21 139 lb 3.2 oz (63.1 kg)  06/26/21 141 lb (64 kg)  06/11/21 139 lb 3.2 oz (63.1 kg)      Other studies Reviewed: Additional studies/ records that were reviewed today include: TTE 01/17/21  Review of the above records today demonstrates:   1. Left ventricular ejection fraction, by estimation, is 55 to 60%. The  left ventricle has normal function. The left ventricle has no regional  wall motion abnormalities. There is mild left ventricular hypertrophy of  the basal-septal segment. Left  ventricular diastolic parameters were normal.   2. Right ventricular systolic function is normal. The right ventricular  size is normal.   3. The mitral valve is normal in structure. No evidence of mitral valve  regurgitation. No evidence of mitral stenosis.   4. The aortic valve is tricuspid. Aortic valve regurgitation is not  visualized. No aortic stenosis is present.   5. The inferior vena cava is normal in size with greater than 50%  respiratory  variability, suggesting right atrial pressure of 3 mmHg.    ASSESSMENT AND PLAN:  1.  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  Device functioning appropriately.  No changes at this time.  2.  Paroxysmal atrial fibrillation: Currently not anticoagulated due to history of GI bleed.  CHA2DS2-VASc of at least 3.  3.  Orthostatic hypotension/dizziness: Currently on midodrine 10 mg 3 times daily, fludrocortisone 0.1 mg daily.  Continue with plan per primary cardiology.  He has continued to have orthostatic issues but they have improved since adjusting the time that he takes his midodrine.  We Prajna Vanderpool continue with current management per primary cardiology.    Current medicines are reviewed at length with the patient today.   The patient does not have concerns regarding his medicines.  The following changes were made today:  none  Labs/ tests ordered today include:  Orders Placed This Encounter  Procedures   EKG 12-Lead     Disposition:   FU with Navah Grondin 9 months  Signed, Breona Cherubin Meredith Leeds, MD  07/28/2021 2:36 PM     [facility] [address] [place] [address] [phone] (office) [phone] (fax)

## 2021-07-29 DIAGNOSIS — I10 Essential (primary) hypertension: Secondary | ICD-10-CM

## 2021-07-29 DIAGNOSIS — F329 Major depressive disorder, single episode, unspecified: Secondary | ICD-10-CM

## 2021-07-31 DIAGNOSIS — D631 Anemia in chronic kidney disease: Secondary | ICD-10-CM | POA: Diagnosis not present

## 2021-07-31 DIAGNOSIS — D500 Iron deficiency anemia secondary to blood loss (chronic): Secondary | ICD-10-CM | POA: Diagnosis not present

## 2021-07-31 DIAGNOSIS — I700 Atherosclerosis of aorta: Secondary | ICD-10-CM | POA: Diagnosis not present

## 2021-07-31 DIAGNOSIS — I129 Hypertensive chronic kidney disease with stage 1 through stage 4 chronic kidney disease, or unspecified chronic kidney disease: Secondary | ICD-10-CM | POA: Diagnosis not present

## 2021-07-31 DIAGNOSIS — C7A019 Malignant carcinoid tumor of the small intestine, unspecified portion: Secondary | ICD-10-CM | POA: Diagnosis not present

## 2021-07-31 DIAGNOSIS — Z950 Presence of cardiac pacemaker: Secondary | ICD-10-CM | POA: Diagnosis not present

## 2021-07-31 DIAGNOSIS — I951 Orthostatic hypotension: Secondary | ICD-10-CM | POA: Diagnosis not present

## 2021-07-31 DIAGNOSIS — N1831 Chronic kidney disease, stage 3a: Secondary | ICD-10-CM | POA: Diagnosis not present

## 2021-07-31 DIAGNOSIS — Z7952 Long term (current) use of systemic steroids: Secondary | ICD-10-CM | POA: Diagnosis not present

## 2021-07-31 DIAGNOSIS — Z9181 History of falling: Secondary | ICD-10-CM | POA: Diagnosis not present

## 2021-07-31 DIAGNOSIS — K912 Postsurgical malabsorption, not elsewhere classified: Secondary | ICD-10-CM | POA: Diagnosis not present

## 2021-07-31 DIAGNOSIS — Z7982 Long term (current) use of aspirin: Secondary | ICD-10-CM | POA: Diagnosis not present

## 2021-07-31 DIAGNOSIS — D630 Anemia in neoplastic disease: Secondary | ICD-10-CM | POA: Diagnosis not present

## 2021-08-02 DIAGNOSIS — D500 Iron deficiency anemia secondary to blood loss (chronic): Secondary | ICD-10-CM | POA: Diagnosis not present

## 2021-08-02 DIAGNOSIS — D630 Anemia in neoplastic disease: Secondary | ICD-10-CM | POA: Diagnosis not present

## 2021-08-02 DIAGNOSIS — Z7952 Long term (current) use of systemic steroids: Secondary | ICD-10-CM | POA: Diagnosis not present

## 2021-08-02 DIAGNOSIS — C7A019 Malignant carcinoid tumor of the small intestine, unspecified portion: Secondary | ICD-10-CM | POA: Diagnosis not present

## 2021-08-02 DIAGNOSIS — D631 Anemia in chronic kidney disease: Secondary | ICD-10-CM | POA: Diagnosis not present

## 2021-08-02 DIAGNOSIS — K912 Postsurgical malabsorption, not elsewhere classified: Secondary | ICD-10-CM | POA: Diagnosis not present

## 2021-08-02 DIAGNOSIS — I951 Orthostatic hypotension: Secondary | ICD-10-CM | POA: Diagnosis not present

## 2021-08-02 DIAGNOSIS — Z9181 History of falling: Secondary | ICD-10-CM | POA: Diagnosis not present

## 2021-08-02 DIAGNOSIS — N1831 Chronic kidney disease, stage 3a: Secondary | ICD-10-CM | POA: Diagnosis not present

## 2021-08-02 DIAGNOSIS — I129 Hypertensive chronic kidney disease with stage 1 through stage 4 chronic kidney disease, or unspecified chronic kidney disease: Secondary | ICD-10-CM | POA: Diagnosis not present

## 2021-08-02 DIAGNOSIS — Z950 Presence of cardiac pacemaker: Secondary | ICD-10-CM | POA: Diagnosis not present

## 2021-08-02 DIAGNOSIS — I700 Atherosclerosis of aorta: Secondary | ICD-10-CM | POA: Diagnosis not present

## 2021-08-02 DIAGNOSIS — Z7982 Long term (current) use of aspirin: Secondary | ICD-10-CM | POA: Diagnosis not present

## 2021-08-04 NOTE — Progress Notes (Signed)
Remote pacemaker transmission.   

## 2021-08-06 ENCOUNTER — Other Ambulatory Visit: Payer: Self-pay

## 2021-08-06 DIAGNOSIS — C7A019 Malignant carcinoid tumor of the small intestine, unspecified portion: Secondary | ICD-10-CM

## 2021-08-06 MED ORDER — POTASSIUM CHLORIDE CRYS ER 20 MEQ PO TBCR: EXTENDED_RELEASE_TABLET | ORAL | 1 refills | Status: DC

## 2021-08-07 ENCOUNTER — Telehealth: Payer: Medicare Other

## 2021-08-07 ENCOUNTER — Encounter
Admission: RE | Admit: 2021-08-07 | Discharge: 2021-08-07 | Disposition: A | Discharge status: Home / Self Care | Payer: Medicare Other | Source: Ambulatory Visit | Attending: Hematology | Admitting: Hematology

## 2021-08-07 ENCOUNTER — Other Ambulatory Visit: Payer: Self-pay

## 2021-08-07 DIAGNOSIS — C7A019 Malignant carcinoid tumor of the small intestine, unspecified portion: Secondary | ICD-10-CM | POA: Diagnosis not present

## 2021-08-07 DIAGNOSIS — K7689 Other specified diseases of liver: Secondary | ICD-10-CM | POA: Diagnosis not present

## 2021-08-07 MED ORDER — GALLIUM GA 68 DOTATATE IV KIT
3.4100 | PACK | Freq: Once | INTRAVENOUS | Status: AC | PRN
  Administered 2021-08-07: 3.41 via INTRAVENOUS

## 2021-08-08 ENCOUNTER — Telehealth: Payer: Medicare Other

## 2021-08-08 ENCOUNTER — Ambulatory Visit (INDEPENDENT_AMBULATORY_CARE_PROVIDER_SITE_OTHER): Payer: Medicare Other | Admitting: *Deleted

## 2021-08-08 DIAGNOSIS — I10 Essential (primary) hypertension: Secondary | ICD-10-CM

## 2021-08-08 DIAGNOSIS — I951 Orthostatic hypotension: Secondary | ICD-10-CM

## 2021-08-08 DIAGNOSIS — I4891 Unspecified atrial fibrillation: Secondary | ICD-10-CM

## 2021-08-08 NOTE — Chronic Care Management (AMB) (Signed)
Chronic Care Management   CCM RN Visit Note  08/08/2021 Name: Alan [name] MRN: [id number] DOB: 1940/09/17  Subjective: Alan [name] is a 81 y.o. year old male who is a primary care patient of [name], MD. The care management team was consulted for assistance with disease management and care coordination needs.    Engaged with patient and his spouse/ caregiver Alan [name], on [facility] DPR by telephone for follow up visit in response to provider referral for case management and/or care coordination services.   Consent to Services:  The patient was given information about Chronic Care Management services, agreed to services, and gave verbal consent prior to initiation of services.  Please see initial visit note for detailed documentation.  Patient agreed to services and verbal consent obtained.   Assessment: Review of patient past medical history, allergies, medications, health status, including review of consultants reports, laboratory and other test data, was performed as part of comprehensive evaluation and provision of chronic care management services.  CCM Care Plan  Allergies  Allergen Reactions   Penicillins Hives and Shortness Of Breath   Lisinopril Itching   Outpatient Encounter Medications as of 08/08/2021  Medication Sig   aspirin 81 MG EC tablet Take 81 mg by mouth daily. Swallow whole.   Ensure Plus (ENSURE PLUS) LIQD Take 237 mLs by mouth 2 (two) times daily between meals.   fludrocortisone (FLORINEF) 0.1 MG tablet Take 1 tablet (0.1 mg total) by mouth daily.   iron polysaccharides (NIFEREX) 150 MG capsule Take 1 capsule (150 mg total) by mouth 3 (three) times a week.   LANREOTIDE ACETATE [place] Inject 1 Dose into the skin every 30 (thirty) days.   midodrine (PROAMATINE) 10 MG tablet Take 1 tablet (10 mg total) by mouth 3 (three) times daily.   mirtazapine (REMERON) 7.5 MG tablet Take 7.5 mg by mouth daily.   Multiple Vitamin (MULTIVITAMIN WITH MINERALS) TABS Take 1 tablet  by mouth daily.   Multiple Vitamins-Minerals (PRESERVISION AREDS 2 PO) Take 1 capsule by mouth daily.   multivitamin (ONE-A-DAY MEN'S) TABS tablet 1 tab   potassium chloride SA (KLOR-CON M) 20 MEQ tablet 77mq three times a day po for 3 days then 267m po BID   SYMBICORT 160-4.5 MCG/ACT inhaler Inhale 2 puffs into the lungs daily as needed (wheezing).   Vitamin D, Ergocalciferol, (DRISDOL) 1.25 MG (50000 UNIT) CAPS capsule Take 50,000 Units by mouth every 7 (seven) days.   No facility-administered encounter medications on file as of 08/08/2021.   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   Orthostatic hypotension 02/20/2021   Iron deficiency anemia due to chronic blood loss 02/20/2021   Chronic diarrhea 01/18/2021   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HCJun[place]07/23/2022   GIB (gastrointestinal bleeding) 01/18/2021   Essential hypertension 01/18/2021   Hypokalemia due to excessive gastrointestinal loss of potassium 01/18/2021   Chronic kidney disease, stage 3a (H[facility]07/23/2022   Conditions to be addressed/monitored:  Atrial Fibrillation and HTN  Care Plan : RN Care Manager Plan of Care  Updates made by ToKnox RoyaltyRN since 08/08/2021 12:00 AM     Problem: Chronic Disease Management Needs   Priority: High     Long-Range Goal: Ongoing adherence to established  plan of care for long term chronic disease management   Start Date: 07/02/2021  Expected End Date: 07/02/2022  Priority: High  Note:   Current Barriers:  Chronic Disease Management support and education needs related to Atrial Fibrillation and HTN Recent hospitalization October 25-27, 2022 for AF with RVR-- 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 Frequent recent  episodes syncope/ resulting in falls-- thus far without injury-- patient reports 16- plus falls over last few weeks, all without significant injury: patient has/ uses walker, cane, wheelchair [facility]: is well-established with current counseling provider-- but would like to have counseling services in [place]: will place CCM CSW referral for same  [facility]- inconsistently wears hearing aids: does not like hearing aids, ambient noise-- encouraged to follow up with hearing aid provider to have adjustments as indicated Patient and spouse report that patient has had recent memory issues-- encouraged them to discuss with PCP at time of next office visit  RNCM Clinical Goal(s):  Patient will demonstrate ongoing health management independence as evidenced by adherence to plan of care for A-Fib/ HTN with hypotensive syncopal episodes, presumably due to A-F        through collaboration with RN Care manager, provider, and care team.   Interventions: 1:1 collaboration with primary care provider regarding development and update of comprehensive plan of care as evidenced by provider attestation and co-signature Inter-disciplinary care team collaboration (see longitudinal plan of care) Evaluation of current treatment plan related to  self management and patient's adherence to plan as established by provider Initial assessment completed 07/02/21 Falls assessment updated: patient reports no new/ recent falls since our last outreach 07/02/21-- continues using cane/ walker, managing showers at home with ongoing precautions in place to avoid falls; reports continues experiencing intermittent dizziness that is self-limiting; taking midodrine as prescribed; positive reinforcement provided with encouragement to continue efforts Confirmed patient/ caregiver spoke with CCM CSW regarding ongoing depression/ desire to obtain psychiatric provider in [place]-- reports they have a scheduled  appointment at [facility] [place] on 08/19/21 (Dr. Michail Sermon); encouraged their ongoing engagement with CCM CSW Confirmed home health services have now signed off; caregiver states patient "met goals" Confirmed no current clinical concerns: patient sounds audibly more upbeat than he did with my last outreach 07/02/21-- they report they are taking a trip to the beach next week, and patient is excited about this  A-fib:  (Status: 08/08/21: Goal on Track (progressing): YES.) Long Term Goal  Reviewed recent provider office visit with Dr. Curt Bears 07/28/21- reports he was approved to start driving again Encouraged gradual/ conservative measures when resuming driving: caregiver/ wife reports she will assist patient as he resumes driving Confirmed that patient/ spouse now have a btter understanding of differing roles of cardiology providers Reinforced previously provided education around signs/ symptoms development A-Fib along corresponding action plan- he denies signs/ symptoms today, aside form intermittent dizziness Discussed action plan for dizzy epiosodes: he understands to use assistive devices with ambulation and to sit/ lie down immediately when dizzy-- will provide additional educational material through [facility] Discussed ongoing oncology provider office visits/ plan of care- patient/ spouse verbalize good understanding of same Reviewed upcoming scheduled provider office visits, confirmed they have plans at attend all as scheduled: 08/15/21; 09/08/21; 10/06/21; 11/03/21; 12/01/21- revolving oncology treatment/provider; 09/01/21- PCP; 10/24/21- cardiology provider: patient/ spouse verbalize awareness of all, plans to attend as scheduled  Hypertension: (Status: 08/08/21: Goal on Track (progressing): YES.) Long Term Goal  Last practice recorded BP readings:  BP Readings from Last 3 Encounters:  07/28/21 128/80  07/15/21 (!) 148/98  06/26/21 136/86  Most recent eGFR/CrCl: No results found for:  EGFR  No components found for: CRCL  Evaluation of current treatment plan related to hypertension self management and patient's adherence to plan as established by provider;   Discussed plans with patient for ongoing care management follow up and provided patient with direct contact information for care management team; Discussed complications of poorly controlled blood pressure such as heart disease, stroke, circulatory complications, vision complications, kidney impairment, sexual dysfunction;  Reviewed recent blood pressures at home:  they report ongoing high blood pressures (160-170/100-110) after midodrine administration; they tell me that his blood pressures begin normalizing within a few hours after patient takes midodrine-- blood pressures come down to "140/80" range Confirmed no recent/ new medication changes: they endorse adherence to established medication regimen Spouse/ caregiver continues to assist in medication management; they have continued the plan they developed in early January around timing of midodrine administration in the afternoon/ evening hours, based on afternoon blood pressure values- this has continued working for them, and was encouraged  Patient Goals/Self-Care Activities: As evidenced by review of EHR, collaboration with care team, and patient reporting during CCM RN CM outreach,  Patient Alan [name] will: Take medications as prescribed Attend all scheduled provider appointments Call pharmacy for medication refills Call provider office for new concerns or questions Take effort to follow heart healthy, low salt, low cholesterol diet Continue your efforts to prevent falls and syncopal (fainting) episodes: continue using your cane/ walker/ wheelchair and taking the measures you have initiated at home to stay safe in the shower and at night when you wake up to go the bathroom: I am so glad to hear that you have not had any recent falls or passing out episodes If you begin  driving again, please start out very conservatively and have someone with you    Plan: Telephone follow up appointment with care management team member scheduled for:  Thursday, September 04, 2021 at 11:15 am The patient has been provided with contact information for the care management team and has been advised to call with any health related questions or concerns  [name], RN, BSN, [place] [phone]: direct office

## 2021-08-08 NOTE — Patient Instructions (Signed)
Visit Information  Amiere and [name], thank you for taking time to talk with me today. Please don't hesitate to contact me if I can be of assistance to you before our next scheduled telephone appointment.  Below are the goals we discussed today:  Patient Self-Care Activities: Patient Alan [name] will: Take medications as prescribed Attend all scheduled provider appointments Call pharmacy for medication refills Call provider office for new concerns or questions Take effort to follow heart healthy, low salt, low cholesterol diet Continue your efforts to prevent falls and syncopal (fainting) episodes: continue using your cane/ walker/ wheelchair and taking the measures you have initiated at home to stay safe in the shower and at night when you wake up to go the bathroom: I am so glad to hear that you have not had any recent falls or passing out episodes If you begin driving again, please start out very conservatively and have someone with you  Our next scheduled telephone follow up visit/ appointment with care management team member is scheduled on:   Thursday, September 04, 2021 at 11:15 am/  pm- This is a PHONE [place] appointment  If you need to cancel or re-schedule our visit, please call [phone] and our care guide team will be happy to assist you.   I look forward to hearing about your progress.   [name], RN, BSN, [place] [phone]: direct office  If you are experiencing a Mental Health or [place] or need someone to talk to, please  call the Suicide and Crisis Lifeline: 988 call the [place] National Suicide Prevention Lifeline: 5346085891 or TTY: 307-417-0458 TTY 323-368-9240) to talk to a trained counselor call 1-800-273-TALK (toll free, 24 hour hotline) go to [facility] [address] [place] [phone]) call 911   Patient verbalizes understanding of  instructions and care plan provided today and agrees to view in [place]. Active MyChart status confirmed with patient  Dizziness Dizziness is a common problem. It is a feeling of unsteadiness or light-headedness. You may feel like you are about to faint. Dizziness can lead to injury if you stumble or fall. Anyone can become dizzy, but dizziness is more common in older adults. This condition can be caused by a number of things, including medicines, dehydration, or illness. Follow these instructions at home: Eating and drinking  Drink enough fluid to keep your urine pale yellow. This helps to keep you from becoming dehydrated. Try to drink more clear fluids, such as water. Do not drink alcohol. Limit your caffeine intake if told to do so by your health care provider. Check ingredients and nutrition facts to see if a food or beverage contains caffeine. Limit your salt (sodium) intake if told to do so by your health care provider. Check ingredients and nutrition facts to see if a food or beverage contains sodium. Activity  Avoid making quick movements. Rise slowly from chairs and steady yourself until you feel okay. In the morning, first sit up on the side of the bed. When you feel okay, stand slowly while you hold onto something until you know that your balance is good. If you need to stand in one place for a long time, move your legs often. Tighten and relax the muscles in your legs while you are standing. Do not drive or use machinery if you feel dizzy. Avoid bending down if you feel dizzy. Place items in your home so that they are  easy for you to reach without leaning over. Lifestyle Do not use any products that contain nicotine or tobacco. These products include cigarettes, chewing tobacco, and vaping devices, such as e-cigarettes. If you need help quitting, ask your health care provider. Try to reduce your stress level by using methods such as yoga or meditation. Talk with your health care  provider if you need help to manage your stress. General instructions Watch your dizziness for any changes. Take over-the-counter and prescription medicines only as told by your health care provider. Talk with your health care provider if you think that your dizziness is caused by a medicine that you are taking. Tell a friend or a family member that you are feeling dizzy. If he or she notices any changes in your behavior, have this person call your health care provider. Keep all follow-up visits. This is important. Contact a health care provider if: Your dizziness does not go away or you have new symptoms. Your dizziness or light-headedness gets worse. You feel nauseous. You have reduced hearing. You have a fever. You have neck pain or a stiff neck. Your dizziness leads to an injury or a fall. Get help right away if: You vomit or have diarrhea and are unable to eat or drink anything. You have problems talking, walking, swallowing, or using your arms, hands, or legs. You feel generally weak. You have any bleeding. You are not thinking clearly or you have trouble forming sentences. It may take a friend or family member to notice this. You have chest pain, abdominal pain, shortness of breath, or sweating. Your vision changes or you develop a severe headache. These symptoms may represent a serious problem that is an emergency. Do not wait to see if the symptoms will go away. Get medical help right away. Call your local emergency services (911 in the [place] Do not drive yourself to the hospital. Summary Dizziness is a feeling of unsteadiness or light-headedness. This condition can be caused by a number of things, including medicines, dehydration, or illness. Anyone can become dizzy, but dizziness is more common in older adults. Drink enough fluid to keep your urine pale yellow. Do not drink alcohol. Avoid making quick movements if you feel dizzy. Monitor your dizziness for any changes. This  information is not intended to replace advice given to you by your health care provider. Make sure you discuss any questions you have with your health care provider. Document Revised: 05/20/2020 Document Reviewed: 05/20/2020 Elsevier Patient Education  2022 [employer].

## 2021-08-11 ENCOUNTER — Inpatient Hospital Stay: Payer: Medicare Other

## 2021-08-12 ENCOUNTER — Telehealth: Payer: Medicare Other

## 2021-08-14 ENCOUNTER — Other Ambulatory Visit: Payer: Self-pay

## 2021-08-14 ENCOUNTER — Other Ambulatory Visit: Payer: Medicare Other

## 2021-08-14 ENCOUNTER — Ambulatory Visit: Payer: Medicare Other

## 2021-08-14 DIAGNOSIS — C7A019 Malignant carcinoid tumor of the small intestine, unspecified portion: Secondary | ICD-10-CM

## 2021-08-15 ENCOUNTER — Inpatient Hospital Stay: Payer: Medicare Other | Attending: Hematology | Admitting: Hematology

## 2021-08-15 ENCOUNTER — Inpatient Hospital Stay: Payer: Medicare Other

## 2021-08-15 ENCOUNTER — Other Ambulatory Visit: Payer: Self-pay

## 2021-08-15 VITALS — BP 126/87 | HR 91 | Temp 98.1°F | Resp 18 | Ht 73.0 in | Wt 138.8 lb

## 2021-08-15 DIAGNOSIS — C7A019 Malignant carcinoid tumor of the small intestine, unspecified portion: Secondary | ICD-10-CM | POA: Diagnosis not present

## 2021-08-15 DIAGNOSIS — C7A098 Malignant carcinoid tumors of other sites: Secondary | ICD-10-CM | POA: Insufficient documentation

## 2021-08-15 DIAGNOSIS — D509 Iron deficiency anemia, unspecified: Secondary | ICD-10-CM | POA: Insufficient documentation

## 2021-08-15 DIAGNOSIS — C7B02 Secondary carcinoid tumors of liver: Secondary | ICD-10-CM | POA: Diagnosis not present

## 2021-08-15 LAB — CBC WITH DIFFERENTIAL (CANCER CENTER ONLY)
Abs Immature Granulocytes: 0.01 10*3/uL (ref 0.00–0.07)
Basophils Absolute: 0 10*3/uL (ref 0.0–0.1)
Basophils Relative: 0 %
Eosinophils Absolute: 0.2 10*3/uL (ref 0.0–0.5)
Eosinophils Relative: 3 %
HCT: 32 % — ABNORMAL LOW (ref 39.0–52.0)
Hemoglobin: 11.1 g/dL — ABNORMAL LOW (ref 13.0–17.0)
Immature Granulocytes: 0 %
Lymphocytes Relative: 27 %
Lymphs Abs: 1.9 10*3/uL (ref 0.7–4.0)
MCH: 30.7 pg (ref 26.0–34.0)
MCHC: 34.7 g/dL (ref 30.0–36.0)
MCV: 88.6 fL (ref 80.0–100.0)
Monocytes Absolute: 0.7 10*3/uL (ref 0.1–1.0)
Monocytes Relative: 10 %
Neutro Abs: 4.3 10*3/uL (ref 1.7–7.7)
Neutrophils Relative %: 60 %
Platelet Count: 205 10*3/uL (ref 150–400)
RBC: 3.61 MIL/uL — ABNORMAL LOW (ref 4.22–5.81)
RDW: 13 % (ref 11.5–15.5)
WBC Count: 7.2 10*3/uL (ref 4.0–10.5)
nRBC: 0 % (ref 0.0–0.2)

## 2021-08-15 LAB — CMP (CANCER CENTER ONLY)
ALT: 19 U/L (ref 0–44)
AST: 19 U/L (ref 15–41)
Albumin: 4 g/dL (ref 3.5–5.0)
Alkaline Phosphatase: 53 U/L (ref 38–126)
Anion gap: 5 (ref 5–15)
BUN: 33 mg/dL — ABNORMAL HIGH (ref 8–23)
CO2: 26 mmol/L (ref 22–32)
Calcium: 9.6 mg/dL (ref 8.9–10.3)
Chloride: 109 mmol/L (ref 98–111)
Creatinine: 1.98 mg/dL — ABNORMAL HIGH (ref 0.61–1.24)
GFR, Estimated: 34 mL/min — ABNORMAL LOW (ref >60)
Glucose, Bld: 140 mg/dL — ABNORMAL HIGH (ref 70–99)
Potassium: 4 mmol/L (ref 3.5–5.1)
Sodium: 140 mmol/L (ref 135–145)
Total Bilirubin: 0.5 mg/dL (ref 0.3–1.2)
Total Protein: 7 g/dL (ref 6.5–8.1)

## 2021-08-15 LAB — IRON AND IRON BINDING CAPACITY (CC-WL,HP ONLY)
Iron: 37 ug/dL — ABNORMAL LOW (ref 45–182)
Saturation Ratios: 16 % — ABNORMAL LOW (ref 17.9–39.5)
TIBC: 231 ug/dL — ABNORMAL LOW (ref 250–450)
UIBC: 194 ug/dL (ref 117–376)

## 2021-08-15 LAB — FERRITIN: Ferritin: 133 ng/mL (ref 24–336)

## 2021-08-15 MED ORDER — LANREOTIDE ACETATE 120 MG/0.5ML ~~LOC~~ SOLN
120.0000 mg | Freq: Once | SUBCUTANEOUS | Status: AC
  Administered 2021-08-15: 120 mg via SUBCUTANEOUS
  Filled 2021-08-15: qty 120

## 2021-08-15 MED ORDER — DRONABINOL 2.5 MG PO CAPS: 2.5000 mg | ORAL_CAPSULE | Freq: Two times a day (BID) | ORAL | 0 refills | Status: DC

## 2021-08-18 ENCOUNTER — Other Ambulatory Visit: Payer: Self-pay | Admitting: Internal Medicine

## 2021-08-18 ENCOUNTER — Ambulatory Visit: Payer: Medicare Other | Admitting: Licensed Clinical Social Worker

## 2021-08-18 DIAGNOSIS — F329 Major depressive disorder, single episode, unspecified: Secondary | ICD-10-CM

## 2021-08-18 DIAGNOSIS — I10 Essential (primary) hypertension: Secondary | ICD-10-CM

## 2021-08-18 DIAGNOSIS — J410 Simple chronic bronchitis: Secondary | ICD-10-CM

## 2021-08-18 MED ORDER — SYMBICORT 160-4.5 MCG/ACT IN AERO: 2.0000 | INHALATION_SPRAY | Freq: Every day | RESPIRATORY_TRACT | 1 refills | Status: DC | PRN

## 2021-08-18 NOTE — Chronic Care Management (AMB) (Signed)
Chronic Care Management   Clinical Social Work Note  08/18/2021 Name: Alan [name] MRN: [id number] DOB: Mar 27, 1941  Alan [name] is a 81 y.o. year old male who is a primary care patient of [name], MD. The CCM team was consulted to assist the patient with chronic disease management and/or care coordination needs related to: [place] and Resources.   Engaged with patient by telephone for follow up visit in response to provider referral for social work chronic care management and care coordination services.   Consent to Services:  The patient was given information about Chronic Care Management services, agreed to services, and gave verbal consent prior to initiation of services.  Please see initial visit note for detailed documentation.   Patient agreed to services and consent obtained.   Summary: Assessed patient's current treatment, progress, coping skills, support system and barriers to care.  Patient was accompanied by his wife who provided information during this encounter. He is making progress with and has initial therapy appointment scheduled with [place].  Also expressed concerns of needing refill of Symbicort, information shared with PCP via message .  See Care Plan below for interventions and patient self-care actives.  Recommendation: Patient may benefit from, and is in agreement to keep appointment with [facility].   Follow up Plan: Patient would like continued follow-up from CCM LCSW.  per patient's request will follow up in 60 days.  Will call office if needed prior to next encounter.   Assessment: Review of patient past medical history, allergies, medications, and health status, including review of relevant consultants reports was performed today as part of a comprehensive evaluation and provision of chronic care management and care coordination services.     SDOH (Social Determinants of Health) assessments and  interventions performed:    Advanced Directives Status:  per patient has documents will bring them to next office appointment  [place]  Conditions to be addressed/monitored: Depression;   Care Plan : LCSW Plan of Care  Updates made by Maurine Cane, LCSW since 08/18/2021 12:00 AM     Problem: Coping Skills unmanaged      Goal: Coping Skills Enhanced by connecting with ongoing therapy   Start Date: 07/15/2021  Expected End Date: 11/25/2021  Recent Progress: On track  Priority: High  Note:   Current Barriers:  Disease Management support and education needs related to managing symptoms of depression Lacks knowledge of how to connect   [place]):  Patient  will work with Education officer, museum to address concerns related to managing symptoms of depression until connected for ongoing therapy will work with [employer] to address needs related to depression  through collaboration with [place] representative, provider, and care team.   Interventions: 1:1 collaboration with primary care provider regarding development and update of comprehensive plan of care as evidenced by provider attestation and co-signature Inter-disciplinary care team collaboration (see longitudinal plan of care) Evaluation of current treatment plan related to  self management and patient's adherence to plan as established by provider Review resources, discussed options and provided patient information about  Options for mental health treatment based on need and insurance  Mental Health:  (Status: Goal on Track (progressing): YES.) Evaluation of current treatment plan related to Depression: depressed mood anxiety Active listening / Reflection utilized  Provided psychoeducation for mental health needs   Patient Self-Care Activities: Keep appointment with [place] in copy of Advance Directive  and HPOA to have copy scanned into chart     Casimer Lanius,  LCSW Licensed Clinical Social Worker [facility]  [place]  [phone]

## 2021-08-18 NOTE — Patient Instructions (Signed)
Visit Information  Thank you for taking time to visit with me today. Please don't hesitate to contact me if I can be of assistance to you before our next scheduled telephone appointment.  Following are the goals we discussed today: connect for therapy  Patient Self-Care Activities: Keep appointment with [place] in copy of Advance Directive and HPOA to have copy scanned into chart  Our next appointment is by telephone on April 17th   Please call the care guide team at [phone] if you need to cancel or reschedule your appointment.   If you are experiencing a Mental Health or [place] or need someone to talk to, please call the [place] National Suicide Prevention Lifeline: (865)812-3702 or TTY: (913)162-0704 TTY 3174331838) to talk to a trained counselor call 1-800-273-TALK (toll free, 24 hour hotline) go to [facility] [address], [place] [phone])   Patient verbalizes understanding of instructions and care plan provided today and agrees to view in [place]. Active MyChart status confirmed with patient.    Casimer Lanius, LCSW Licensed Clinical Social Worker [facility]  [place]  [phone]

## 2021-08-19 ENCOUNTER — Ambulatory Visit (INDEPENDENT_AMBULATORY_CARE_PROVIDER_SITE_OTHER): Payer: Medicare Other | Admitting: Psychologist

## 2021-08-19 ENCOUNTER — Other Ambulatory Visit: Payer: Self-pay | Admitting: Internal Medicine

## 2021-08-19 ENCOUNTER — Encounter: Payer: Self-pay | Admitting: Hematology and Oncology

## 2021-08-19 ENCOUNTER — Other Ambulatory Visit: Payer: Self-pay

## 2021-08-19 DIAGNOSIS — F329 Major depressive disorder, single episode, unspecified: Secondary | ICD-10-CM

## 2021-08-19 DIAGNOSIS — F411 Generalized anxiety disorder: Secondary | ICD-10-CM | POA: Diagnosis not present

## 2021-08-19 DIAGNOSIS — F331 Major depressive disorder, recurrent, moderate: Secondary | ICD-10-CM | POA: Diagnosis not present

## 2021-08-19 MED ORDER — MIRTAZAPINE 7.5 MG PO TABS: 7.5000 mg | ORAL_TABLET | Freq: Every day | ORAL | 1 refills | Status: DC

## 2021-08-19 NOTE — Plan of Care (Signed)

## 2021-08-19 NOTE — Progress Notes (Signed)
HEMATOLOGY/ONCOLOGY CLINIC NOTE  Date of Service: .08/15/2021   Patient Care Team: [name], MD as PCP - General (Internal Medicine) Sueanne Margarita, MD as PCP - Cardiology (Cardiology) Constance Haw, MD as PCP - Electrophysiology (Cardiology) [name], RN as Case Manager Laurance Flatten, Bosie Helper, LCSW as Social Worker (Licensed Clinical Social Worker)  CHIEF COMPLAINTS/PURPOSE OF CONSULTATION:  Follow-up for continued evaluation and management of metastatic small intestinal carcinoid  HISTORY OF PRESENTING ILLNESS:  Please see previous note for details on initial presentation  INTERVAL HISTORY  Read Alan [name] is is here for continued evaluation and management of his metastatic small intestinal carcinoid with ongoing diarrhea.  He is accompanied by his wife. He notes that his diarrhea is under slightly better control with regular use of Imodium in addition to his monthly lanreotide.  PET CT scan (DD22 dotatate) shows intense radiotracer avid large well-defined neuroendocrine tumor metastasis to the right hepatic lobe and a smaller lesion in the left hepatic lobe. Suspicion of nodule in the tail of the pancreas. Scattered peritoneal nodularity with radiotracer activity consistent with peritoneal metastases. No focal bowel lesions or evidence of bowel obstruction or skeletal metastases known.  Labs today show stable CBC with hemoglobin of 11.1 and WBC count of 7.2 with platelet count of 205k CMP shows normal potassium of 4 chronic kidney disease with creatinine of 1.98 stable. Ferritin 133 with an iron saturation of 16%.  MEDICAL HISTORY:  Past Medical History:  Diagnosis Date   Asthma    Carcinoid tumor, small intestine, malignant (HCC)    Chronic diarrhea    CKD (chronic kidney disease) stage 3, GFR 30-59 ml/min (HCC)    HTN (hypertension)    Short gut syndrome     SURGICAL HISTORY: Past Surgical History:  Procedure Laterality Date   COLON SURGERY      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   Procedure: PACEMAKER IMPLANT;  Surgeon: Constance Haw, MD;  Location: [facility];  Service: Cardiovascular;  Laterality: N/A;    SOCIAL HISTORY: Social History   Socioeconomic History   Marital status: Married    Spouse name: Not on file   Number of children: Not on file   Years of education: Not on file   Highest education level: Not on file  Occupational History   Not on file  Tobacco Use   Smoking status: Former    Packs/day: 0.30    Years: 61.00    Pack years: 18.30    Types: Cigarettes    Start date: 55    Quit date: 03/03/2020    Years since quitting: 1.4   Smokeless tobacco: Never  Vaping Use   Vaping Use: Never used  Substance and Sexual Activity   Alcohol use: Not Currently   Drug use: No   Sexual activity: Not Currently  Other Topics Concern   Not on file  Social History Narrative   Not on file   Social Determinants of Health   Financial Resource Strain: Low Risk    Difficulty of Paying Living Expenses: Not very hard  Food Insecurity: No Food Insecurity   Worried About Running Out of Food in the Last Year: Never true   [place] in the Last Year: Never true  Transportation Needs: No Transportation Needs   Lack of Transportation (Medical): No   Lack of Transportation (Non-Medical): No  Physical Activity: Not on file  Stress: Stress Concern Present   Feeling of Stress : Very  much  Social Connections: Not on file  Intimate Partner Violence: Not on file    FAMILY HISTORY: Family History  Problem Relation Age of Onset   Alzheimer's disease Mother     ALLERGIES:  is allergic to penicillins and lisinopril.  MEDICATIONS:  Current Outpatient Medications  Medication Sig Dispense Refill   dronabinol (MARINOL) 2.5 MG capsule Take 1 capsule (2.5 mg total) by mouth 2 (two) times daily before a meal. 60 capsule 0   aspirin 81 MG EC tablet Take 81 mg by mouth daily. Swallow whole.     Ensure Plus  (ENSURE PLUS) LIQD Take 237 mLs by mouth 2 (two) times daily between meals.     fludrocortisone (FLORINEF) 0.1 MG tablet Take 1 tablet (0.1 mg total) by mouth daily. 90 tablet 3   iron polysaccharides (NIFEREX) 150 MG capsule Take 1 capsule (150 mg total) by mouth 3 (three) times a week. 30 capsule 2   LANREOTIDE ACETATE [place] Inject 1 Dose into the skin every 30 (thirty) days.     midodrine (PROAMATINE) 10 MG tablet Take 1 tablet (10 mg total) by mouth 3 (three) times daily. 270 tablet 3   mirtazapine (REMERON) 7.5 MG tablet Take 1 tablet (7.5 mg total) by mouth daily. 90 tablet 1   Multiple Vitamin (MULTIVITAMIN WITH MINERALS) TABS Take 1 tablet by mouth daily.     Multiple Vitamins-Minerals (PRESERVISION AREDS 2 PO) Take 1 capsule by mouth daily.     multivitamin (ONE-A-DAY MEN'S) TABS tablet 1 tab     potassium chloride SA (KLOR-CON M) 20 MEQ tablet 15meq three times a day po for 3 days then 77meq po BID 60 tablet 1   SYMBICORT 160-4.5 MCG/ACT inhaler Inhale 2 puffs into the lungs daily as needed (wheezing). 3 each 1   Vitamin D, Ergocalciferol, (DRISDOL) 1.25 MG (50000 UNIT) CAPS capsule Take 50,000 Units by mouth every 7 (seven) days.     No current facility-administered medications for this visit.    REVIEW OF SYSTEMS:    10 Point review of Systems was done is negative except as noted above.  PHYSICAL EXAMINATION: ECOG PERFORMANCE STATUS: 2 - Symptomatic, <50% confined to bed  . Vitals:   08/15/21 1000  BP: 126/87  Pulse: 91  Resp: 18  Temp: 98.1 F (36.7 C)  SpO2: 100%   Filed Weights   08/15/21 1000  Weight: 138 lb 12.8 oz (63 kg)   .Body mass index is 18.31 kg/m. NAD GENERAL:alert, in no acute distress and comfortable SKIN: no acute rashes, no significant lesions EYES: conjunctiva are pink and non-injected, sclera anicteric OROPHARYNX: MMM, no exudates, no oropharyngeal erythema or ulceration NECK: supple, no JVD LYMPH:  no palpable lymphadenopathy in the cervical,  axillary or inguinal regions LUNGS: clear to auscultation b/l with normal respiratory effort HEART: regular rate & rhythm ABDOMEN:  normoactive bowel sounds , non tender, not distended. Extremity: no pedal edema PSYCH: alert & oriented x 3 with fluent speech NEURO: no focal motor/sensory deficits   LABORATORY DATA:  I have reviewed the data as listed  CBC Latest Ref Rng & Units 08/15/2021 07/15/2021 06/26/2021  WBC 4.0 - 10.5 K/uL 7.2 7.5 9.5  Hemoglobin 13.0 - 17.0 g/dL 11.1(L) 11.4(L) 11.6(L)  Hematocrit 39.0 - 52.0 % 32.0(L) 32.5(L) 32.8(L)  Platelets 150 - 400 K/uL 205 198 216    CMP Latest Ref Rng & Units 08/15/2021 07/15/2021 06/26/2021  Glucose 70 - 99 mg/dL 140(H) 131(H) 119(H)  BUN 8 - 23 mg/dL 33(H)  31(H) 37(H)  Creatinine 0.61 - 1.24 mg/dL 1.98(H) 1.76(H) 1.91(H)  Sodium 135 - 145 mmol/L 140 142 141  Potassium 3.5 - 5.1 mmol/L 4.0 3.9 3.6  Chloride 98 - 111 mmol/L 109 109 105  CO2 22 - 32 mmol/L 26 25 27   Calcium 8.9 - 10.3 mg/dL 9.6 9.7 9.4  Total Protein 6.5 - 8.1 g/dL 7.0 7.3 7.5  Total Bilirubin 0.3 - 1.2 mg/dL 0.5 0.6 1.0  Alkaline Phos 38 - 126 U/L 53 67 69  AST 15 - 41 U/L 19 16 25   ALT 0 - 44 U/L 19 17 42    Lab Results  Component Value Date   IRON 37 (L) 08/15/2021   TIBC 231 (L) 08/15/2021   IRONPCTSAT 16 (L) 08/15/2021   (Iron and TIBC)  Lab Results  Component Value Date   FERRITIN 133 08/15/2021    RADIOGRAPHIC STUDIES:  I have personally reviewed the radiological images as listed and agreed with the findings in the report. NM PET (NETSPOT GA 50 DOTATATE) SKULL BASE TO MID THIGH  Result Date: 08/07/2021 CLINICAL DATA:  Metastatic carcinoid tumor. Malignant carcinoid small intestine. EXAM: NUCLEAR MEDICINE PET SKULL BASE TO THIGH TECHNIQUE: 3.4 mCi gallium 68 DOTATATE was injected intravenously. Full-ring PET imaging was performed from the skull base to thigh after the radiotracer. CT data was obtained and used for attenuation correction and  anatomic localization. COMPARISON:  Octreotide scan 04/24/2010. MRI of the abdomen 08/18/2010 FINDINGS: NECK No radiotracer activity in neck lymph nodes. Incidental CT findings: None CHEST No radiotracer accumulation within mediastinal or hilar lymph nodes. No suspicious pulmonary nodules on the CT scan. Incidental CT finding:None ABDOMEN/PELVIS Within the superior aspect of the RIGHT hepatic lobe rounded low-attenuation lesion is subtly seen on noncontrast CT exam measuring 3.0 x 3.7 cm. This subtle lesion on CT is represented by intense radiotracer activity on the DOTATATE PET scan with SUV max equal 34.11. Smaller lesion in the central LEFT hepatic lobe adjacent to the falciform ligament with SUV max equal 15.3. This lesion is not identified on the noncontrast CT. There is radiotracer avid thickening at the tail the pancreas with SUV max equal 15.3. No discrete lesion on CT portion exam. Subtle peritoneal nodule the lateral RIGHT peritoneal space measuring 10 mm (169/series 3) with SUV max equal 13.7. Ventral peritoneal nodule midline with SUV max equal 13.1 (image 179). Physiologic activity within the small. Physiologic activity noted in the liver, spleen, adrenal glands and kidneys. Incidental CT findings:None SKELETON No focal activity to suggest skeletal metastasis. Incidental CT findings:None IMPRESSION: 1. 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 Smaller radiotracer avid lesion LEFT hepatic lobe. 2. Suspicion for nodule in the tail the pancreas with intense radiotracer activity. Differential includes metastatic lesion neuroendocrine tumor versus primary neuroendocrine tumor. 3. 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 4. No evidence of bowel obstruction. No focal lesion the bowel identified. 5. No skeletal metastasis. Electronically Signed   By: Suzy Bouchard M.D.   On:  08/07/2021 15:51   CUP PACEART REMOTE DEVICE CHECK  Result Date: 07/24/2021 Scheduled remote reviewed. Normal device function.  Next remote 91 days. LA   ASSESSMENT & PLAN:   82 year old male with  #1 Metastatic small bowel malignant carcinoid with chronic significant diarrhea and weight loss. Patient is currently on lanreotide every 4 weeks Plan -We discussed labs from today showing stable CBC with hemoglobin of 11.1 and WBC count of 7.2 with  platelet count of 205k CMP shows normal potassium of 4 chronic kidney disease with creatinine of 1.98 stable. Ferritin 133 with an iron saturation of 16%. -We discussed in detail his PET CT scan (XJ88 dotatate) shows intense radiotracer avid large well-defined neuroendocrine tumor metastasis to the right hepatic lobe and a smaller lesion in the left hepatic lobe. Suspicion of nodule in the tail of the pancreas. Scattered peritoneal nodularity with radiotracer activity consistent with peritoneal metastases. No focal bowel lesions or evidence of bowel obstruction or skeletal metastases known.  -She will get a chromogranin A level with his next labs -Continue lanreotide every 4 weeks at this time. -Continue Imodium 2 mg p.o. daily and additionally as needed per instructions -After discussion with patient and his wife he is agreeable to get a referral to see our nuclear medicine colleague Dr. Suzy Bouchard for consideration of Lutathera treatment. I discussed this treatment with him in details and he would like to discuss this further with Dr. Leonia Reeves before making a final decision.  #2 s/p  severe hypokalemia -now resolved after aggressive potassium replacement. Likely related to his decreased p.o. intake, chronic diarrhea and the use of Florinef. PLan -Continue p.o. potassium replacement  -Counseled on using Imodium to control his diarrhea. -Potassium levels today were normal  #3 Iron deficiency anemia . Lab Results  Component Value Date    IRON 37 (L) 08/15/2021   TIBC 231 (L) 08/15/2021   IRONPCTSAT 16 (L) 08/15/2021   (Iron and TIBC)  Lab Results  Component Value Date   FERRITIN 133 08/15/2021   Plan -Patient's ferritin more than 100  -No indication for additional IV iron at this time  Follow-up Referral to Dr. Suzy Bouchard for consideration of Lutathera Continue lanreotide every 4 weeks please schedule next 6 doses. Labs with every lanreotide dose. Follow-up with Dr. Irene Limbo in 8 weeks   The total time spent in the appointment was 41 minutes*.  All of the patient's questions were answered with apparent satisfaction. The patient knows to call the clinic with any problems, questions or concerns.   Sullivan Lone MD MS AAHIVMS [facility] Hematology/Oncology Physician [facility]  .*Total Encounter Time as defined by the Centers for Medicare and Medicaid Services includes, in addition to the face-to-face time of a patient visit (documented in the note above) non-face-to-face time: obtaining and reviewing outside history, ordering and reviewing medications, tests or procedures, care coordination (communications with other health care professionals or caregivers) and documentation in the medical record.   Marland Kitchen

## 2021-08-19 NOTE — Progress Notes (Signed)
[place] Counselor Initial Adult Exam  Name: Alan [name] Date: 08/19/2021 MRN: [id number] DOB: April 22, 1941 PCP: [name], MD  Time spent: 11:02 am to 11:38 am; total time: 36 minutes  This session was held via in person. The patient consented to in-person therapy and was in the clinician's office. Limits of confidentiality were discussed with the patient.   Guardian/Payee:  NA    Paperwork requested: No   Reason for Visit /Presenting Problem: Anxiety and depression  Mental Status Exam: Appearance:   Casual     Behavior:  Appropriate  Motor:  Normal  Speech/Language:   Clear and Coherent  Affect:  Appropriate  Mood:  normal  Thought process:  normal  Thought content:    WNL  Sensory/Perceptual disturbances:    WNL  Orientation:  oriented to person, place, and time/date  Attention:  Good  Concentration:  Good  Memory:  WNL  Fund of knowledge:   Fair  Insight:    Fair  Judgment:   Fair  Impulse Control:  Fair     Reported Symptoms:  The patient endorsed experiencing the following: racing thoughts, feeling on edge, feeling restless, multiple worries, heart palpitations, and difficulty controlling worries.   The patient endorsed experiencing the following: feeling down, sad, rumination of negative thoughts, low self-esteem, fatigue, social isolation, avoiding pleasurable activities, and thoughts of hopelessness. He endorsed passive suicidal ideation. He denied having a plan or intent to act on a plan. He denied homicidal ideation.   Risk Assessment: Danger to Self:   Patient endorsed passive suicidal ideation. He denied having a plan or intent to act on a plan.  Self-injurious Behavior: No Danger to Others: No Duty to Warn:no Physical Aggression / Violence:No  Access to Firearms a concern: No  Gang Involvement:No  Patient / guardian was educated about steps to take if suicide or homicide risk level increases between visits: n/a While future  psychiatric events cannot be accurately predicted, the patient does not currently require acute inpatient psychiatric care and does not currently meet [facility] involuntary commitment criteria.  Substance Abuse History: Current substance abuse: No     Past Psychiatric History:   Previous psychological history is significant for anxiety and depression Outpatient Providers:NA History of Psych Hospitalization: No  Psychological Testing:  NA    Abuse History:  Victim of: No.,  NA    Report needed: No. Victim of Neglect:No. Perpetrator of  NA   Witness / Exposure to Domestic Violence: No   Protective Services Involvement: No  Witness to [employer] Violence:  No   Family History:  Family History  Problem Relation Age of Onset   Alzheimer's disease Mother     Living situation: the patient lives with their spouse  Sexual Orientation: Straight  Relationship Status: married  Name of spouse / other:Renee  If a parent, number of children / ages: Patient stated that he has two sons.   Support Systems: spouse  Museum/gallery curator Stress:  No   Income/Employment/Disability: Actor: No   Educational History: Education: post Forensic psychologist work or degree  Religion/Sprituality/World View: Patient described himself as a spiritual individual.   Any cultural differences that may affect / interfere with treatment:  not applicable   Recreation/Hobbies: Playing golf  Stressors: Health problems   Marital or family conflict    Strengths: Supportive Relationships  Barriers:  Health Concerns   Legal History: Pending legal issue / charges: The patient has no significant history of legal  issues. History of legal issue / charges:  NA  Medical History/Surgical History: reviewed Past Medical History:  Diagnosis Date   Asthma    Carcinoid tumor, small intestine, malignant (HCC)    Chronic diarrhea    CKD (chronic kidney disease) stage 3, GFR 30-59  ml/min (HCC)    HTN (hypertension)    Short gut syndrome     Past Surgical History:  Procedure Laterality Date   COLON SURGERY     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   Procedure: PACEMAKER IMPLANT;  Surgeon: Constance Haw, MD;  Location: [place] [facility];  Service: Cardiovascular;  Laterality: N/A;    Medications: Current Outpatient Medications  Medication Sig Dispense Refill   aspirin 81 MG EC tablet Take 81 mg by mouth daily. Swallow whole.     dronabinol (MARINOL) 2.5 MG capsule Take 1 capsule (2.5 mg total) by mouth 2 (two) times daily before a meal. 60 capsule 0   Ensure Plus (ENSURE PLUS) LIQD Take 237 mLs by mouth 2 (two) times daily between meals.     fludrocortisone (FLORINEF) 0.1 MG tablet Take 1 tablet (0.1 mg total) by mouth daily. 90 tablet 3   iron polysaccharides (NIFEREX) 150 MG capsule Take 1 capsule (150 mg total) by mouth 3 (three) times a week. 30 capsule 2   LANREOTIDE ACETATE [place] Inject 1 Dose into the skin every 30 (thirty) days.     midodrine (PROAMATINE) 10 MG tablet Take 1 tablet (10 mg total) by mouth 3 (three) times daily. 270 tablet 3   mirtazapine (REMERON) 7.5 MG tablet Take 7.5 mg by mouth daily.     Multiple Vitamin (MULTIVITAMIN WITH MINERALS) TABS Take 1 tablet by mouth daily.     Multiple Vitamins-Minerals (PRESERVISION AREDS 2 PO) Take 1 capsule by mouth daily.     multivitamin (ONE-A-DAY MEN'S) TABS tablet 1 tab     potassium chloride SA (KLOR-CON M) 20 MEQ tablet 64meq three times a day po for 3 days then 52meq po BID 60 tablet 1   SYMBICORT 160-4.5 MCG/ACT inhaler Inhale 2 puffs into the lungs daily as needed (wheezing). 3 each 1   Vitamin D, Ergocalciferol, (DRISDOL) 1.25 MG (50000 UNIT) CAPS capsule Take 50,000 Units by mouth every 7 (seven) days.     No current facility-administered medications for this visit.    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 The patient lives at home with his wife, and son. The patient meets criteria for a diagnosis of F41.1 generalized anxiety disorder based off of the following: racing thoughts, feeling on edge, feeling restless, multiple worries, heart palpitations, and difficulty controlling worries. The patient also meets criteria for a diagnosis of F33.1 major depressive affective disorder, recurrent, moderate based off of the following:  feeling down, sad, rumination of negative thoughts, low self-esteem, fatigue, social isolation, avoiding pleasurable activities, and thoughts of hopelessness. He endorsed passive suicidal ideation. He denied having a plan or intent to act on a plan. He denied homicidal ideation.   The patient stated that he wants coping skills.  This psychologist makes the recommendation that the patient participate in therapy every two to three weeks. Depending on severity of conditions and severity of  health conditions, possibly more regularly.    Conception Chancy, PsyD

## 2021-08-19 NOTE — Progress Notes (Signed)
                Tel Hevia, PsyD 

## 2021-08-26 DIAGNOSIS — I10 Essential (primary) hypertension: Secondary | ICD-10-CM | POA: Diagnosis not present

## 2021-08-26 DIAGNOSIS — I4891 Unspecified atrial fibrillation: Secondary | ICD-10-CM

## 2021-08-26 DIAGNOSIS — F329 Major depressive disorder, single episode, unspecified: Secondary | ICD-10-CM

## 2021-09-01 ENCOUNTER — Ambulatory Visit (INDEPENDENT_AMBULATORY_CARE_PROVIDER_SITE_OTHER): Payer: Medicare Other | Admitting: Internal Medicine

## 2021-09-01 ENCOUNTER — Other Ambulatory Visit: Payer: Self-pay

## 2021-09-01 ENCOUNTER — Encounter: Payer: Self-pay | Admitting: Internal Medicine

## 2021-09-01 VITALS — BP 126/80 | HR 63 | Temp 98.1°F | Ht 73.0 in | Wt 139.0 lb

## 2021-09-01 DIAGNOSIS — D500 Iron deficiency anemia secondary to blood loss (chronic): Secondary | ICD-10-CM | POA: Diagnosis not present

## 2021-09-01 DIAGNOSIS — J410 Simple chronic bronchitis: Secondary | ICD-10-CM | POA: Diagnosis not present

## 2021-09-01 DIAGNOSIS — I10 Essential (primary) hypertension: Secondary | ICD-10-CM

## 2021-09-01 DIAGNOSIS — N1831 Chronic kidney disease, stage 3a: Secondary | ICD-10-CM | POA: Diagnosis not present

## 2021-09-01 MED ORDER — ALBUTEROL SULFATE HFA 108 (90 BASE) MCG/ACT IN AERS: 2.0000 | INHALATION_SPRAY | Freq: Four times a day (QID) | RESPIRATORY_TRACT | 5 refills | Status: DC | PRN

## 2021-09-01 NOTE — Patient Instructions (Signed)
Chronic Obstructive Pulmonary Disease °Chronic obstructive pulmonary disease (COPD) is a long-term (chronic) condition that affects the lungs. COPD is a general term that can be used to describe many different lung problems that cause lung inflammation and limit airflow, including chronic bronchitis and emphysema. °If you have COPD, your lung function will probably never return to normal. In most cases, it gets worse over time. However, there are steps you can take to slow the progression of the disease and improve your quality of life. °What are the causes? °This condition may be caused by: °Smoking. This is the most common cause. °Certain genes passed down through families. °What increases the risk? °The following factors may make you more likely to develop this condition: °Being exposed to secondhand smoke from cigarettes, pipes, or cigars. °Being exposed to chemicals and other irritants, such as fumes and dust in the work environment. °Having chronic lung conditions or infections. °What are the signs or symptoms? °Symptoms of this condition include: °Shortness of breath, especially during physical activity. °Chronic cough with a large amount of thick mucus. Sometimes, the cough may not have any mucus (dry cough). °Wheezing and rapid breathing. °Gray or bluish discoloration (cyanosis) of the skin, especially in the fingers, toes, or lips. °Feeling tired (fatigue). °Weight loss. °Chest tightness. °Frequent infections. °Episodes when breathing symptoms become much worse (exacerbations). °At the later stages of this disease, you may have swelling in the ankles, feet, or legs. °How is this diagnosed? °This condition is diagnosed based on: °Your medical history. °A physical exam. °You may also have tests, including: °Lung (pulmonary) function tests. This may include a spirometry test, which measures your ability to exhale properly. °Chest X-ray. °CT scan. °Blood tests. °How is this treated? °This condition may be  treated with: °Medicines. These may include inhaled rescue medicines to treat acute exacerbations as well as medicines that you take long-term (maintenance medicines) to prevent flare-ups of COPD. °Bronchodilators help treat COPD by dilating the airways to allow increased airflow and make your breathing more comfortable. °Steroids can reduce airway inflammation and help prevent exacerbations. °Smoking cessation. If you smoke, your health care provider may ask you to quit, and may also recommend therapy or replacement products to help you quit. °Pulmonary rehabilitation. This may involve working with a team of health care providers and specialists, such as respiratory, occupational, and physical therapists. °Exercise and physical activity. These are beneficial for nearly all people with COPD. °Nutrition therapy to gain weight, if you are underweight. °Oxygen. Supplemental oxygen therapy is only helpful if you have a low oxygen level in your blood (hypoxemia). °Lung surgery or transplant. °Palliative care. This is to help people with COPD feel comfortable when treatment is no longer working. °Follow these instructions at home: °Medicines °Take over-the-counter and prescription medicines only as told by your health care provider. This includes inhaled medicines and pills. °Talk to your health care provider before taking any cough or allergy medicines. You may need to avoid certain medicines that dry out your airways. °Lifestyle °If you smoke, the most important thing that you can do is to stop smoking. Continuing to smoke will cause the disease to progress faster. °Do not use any products that contain nicotine or tobacco. These products include cigarettes, chewing tobacco, and vaping devices, such as e-cigarettes. If you need help quitting, ask your health care provider. °Avoid exposure to things that irritate your lungs, such as smoke, chemicals, and fumes. °Stay active, but balance activity with periods of rest.  Exercise and physical   activity will help you maintain your ability to do things you want to do. °Learn and use relaxation techniques to manage stress and to control your breathing. °Get the right amount of sleep and get quality sleep. Most adults need 7 or more hours per night. °Eat healthy foods. Eating smaller, more frequent meals and resting before meals may help you maintain your strength. °Controlled breathing °Learn and use controlled breathing techniques as directed by your health care provider. Controlled breathing techniques include: °Pursed lip breathing. Start by breathing in (inhaling) through your nose for 1 second. Then, purse your lips as if you were going to whistle and breathe out (exhale) through the pursed lips for 2 seconds. °Diaphragmatic breathing. Start by putting one hand on your abdomen just above your waist. Inhale slowly through your nose. The hand on your abdomen should move out. Then purse your lips and exhale slowly. You should be able to feel the hand on your abdomen moving in as you exhale. ° °Controlled coughing °Learn and use controlled coughing to clear mucus from your lungs. Controlled coughing is a series of short, progressive coughs. The steps of controlled coughing are: °Lean your head slightly forward. °Breathe in deeply using diaphragmatic breathing. °Try to hold your breath for 3 seconds. °Keep your mouth slightly open while coughing twice. °Spit any mucus out into a tissue. °Rest and repeat the steps once or twice as needed. °General instructions °Make sure you receive all the vaccines that your health care provider recommends, especially the pneumococcal and influenza vaccines. Preventing infection and hospitalization is very important when you have COPD. °Drink enough fluid to keep your urine pale yellow, unless you have a medical condition that requires fluid restriction. °Use oxygen therapy and pulmonary rehabilitation if told by your health care provider. If you  require home oxygen therapy, ask your health care provider whether you should purchase a pulse oximeter to measure your oxygen level at home. °Work with your health care provider to develop a COPD action plan. This will help you know what steps to take if your condition gets worse. °Keep other chronic health conditions under control as told by your health care provider. °Avoid extreme temperature and humidity changes. °Avoid contact with people who have an illness that spreads from person to person (is contagious), such as viral infections or pneumonia. °Keep all follow-up visits. This is important. °Contact a health care provider if: °You are coughing up more mucus than usual. °There is a change in the color or thickness of your mucus. °Your breathing is more labored than usual. °Your breathing is faster than usual. °You have difficulty sleeping. °You need to use your rescue medicines or inhalers more often than expected. °You have trouble doing routine activities such as getting dressed or walking around the house. °Get help right away if: °You have shortness of breath while you are resting. °You have shortness of breath that prevents you from: °Being able to talk. °Performing your usual physical activities. °You have chest pain lasting longer than 5 minutes. °Your skin color is more blue (cyanotic) than usual. °You measure low oxygen saturations for longer than 5 minutes with a pulse oximeter. °You have a fever. °You feel too tired to breathe normally. °These symptoms may represent a serious problem that is an emergency. Do not wait to see if the symptoms will go away. Get medical help right away. Call your local emergency services (911 in the [place] Do not drive yourself to the hospital. °Summary °Chronic obstructive pulmonary   disease (COPD) is a long-term (chronic) condition that affects the lungs. °Your lung function will probably never return to normal. In most cases, it gets worse over time. However, there  are steps you can take to slow the progression of the disease and improve your quality of life. °Treatment for COPD may include taking medicines, quitting smoking, pulmonary rehabilitation, and changes to diet and exercise. As the disease progresses, you may need oxygen therapy, a lung transplant, or palliative care. °To help manage your condition, do not smoke, avoid exposure to things that irritate your lungs, stay up to date on all vaccines, and follow your health care provider's instructions for taking medicines. °This information is not intended to replace advice given to you by your health care provider. Make sure you discuss any questions you have with your health care provider. °Document Revised: 04/23/2020 Document Reviewed: 04/23/2020 °Elsevier Patient Education © 2022 [employer] ° °

## 2021-09-01 NOTE — Progress Notes (Signed)
Subjective:  Patient ID: Alan [name], male    DOB: [birth year]  Age: 81 y.o. MRN: [id number]  CC: COPD and Anemia  This visit occurred during the SARS-CoV-2 public health emergency.  Safety protocols were in place, including screening questions prior to the visit, additional usage of staff PPE, and extensive cleaning of exam room while observing appropriate contact time as indicated for disinfecting solutions.    HPI Alan [name] presents for f/up -  He feels better - has less diarrhea.  Outpatient Medications Prior to Visit  Medication Sig Dispense Refill   aspirin 81 MG EC tablet Take 81 mg by mouth daily. Swallow whole.     dronabinol (MARINOL) 2.5 MG capsule Take 1 capsule (2.5 mg total) by mouth 2 (two) times daily before a meal. 60 capsule 0   Ensure Plus (ENSURE PLUS) LIQD Take 237 mLs by mouth 2 (two) times daily between meals.     fludrocortisone (FLORINEF) 0.1 MG tablet Take 1 tablet (0.1 mg total) by mouth daily. 90 tablet 3   iron polysaccharides (NIFEREX) 150 MG capsule Take 1 capsule (150 mg total) by mouth 3 (three) times a week. 30 capsule 2   LANREOTIDE ACETATE [place] Inject 1 Dose into the skin every 30 (thirty) days.     midodrine (PROAMATINE) 10 MG tablet Take 1 tablet (10 mg total) by mouth 3 (three) times daily. 270 tablet 3   mirtazapine (REMERON) 7.5 MG tablet Take 1 tablet (7.5 mg total) by mouth daily. 90 tablet 1   Multiple Vitamin (MULTIVITAMIN WITH MINERALS) TABS Take 1 tablet by mouth daily.     Multiple Vitamins-Minerals (PRESERVISION AREDS 2 PO) Take 1 capsule by mouth daily.     multivitamin (ONE-A-DAY MEN'S) TABS tablet 1 tab     potassium chloride SA (KLOR-CON M) 20 MEQ tablet 68mq three times a day po for 3 days then 271m po BID 60 tablet 1   SYMBICORT 160-4.5 MCG/ACT inhaler Inhale 2 puffs into the lungs daily as needed (wheezing). 3 each 1   Vitamin D, Ergocalciferol, (DRISDOL) 1.25 MG (50000 UNIT) CAPS capsule Take 50,000 Units by mouth every 7  (seven) days.     No facility-administered medications prior to visit.    ROS Review of Systems  Constitutional:  Positive for fatigue. Negative for chills, diaphoresis and unexpected weight change.  HENT: Negative.    Eyes: Negative.   Respiratory:  Positive for shortness of breath and wheezing. Negative for cough and chest tightness.   Cardiovascular:  Negative for chest pain, palpitations and leg swelling.  Gastrointestinal:  Positive for diarrhea. Negative for abdominal pain, blood in stool, nausea and vomiting.  Genitourinary: Negative.   Neurological:  Positive for weakness. Negative for dizziness, light-headedness and headaches.  Hematological:  Negative for adenopathy. Does not bruise/bleed easily.  Psychiatric/Behavioral: Negative.     Objective:  BP 126/80 (BP Location: Left Arm, Patient Position: Sitting, Cuff Size: Large)    Pulse 63    Temp 98.1 F (36.7 C) (Oral)    Ht '6\' 1"'$  (1.854 m)    Wt 139 lb (63 kg)    SpO2 97%    BMI 18.34 kg/m   BP Readings from Last 3 Encounters:  09/01/21 126/80  08/15/21 126/87  07/28/21 128/80    Wt Readings from Last 3 Encounters:  09/01/21 139 lb (63 kg)  08/15/21 138 lb 12.8 oz (63 kg)  07/28/21 139 lb 3.2 oz (63.1 kg)    Physical Exam Vitals reviewed.  Constitutional:      Appearance: He is underweight.  HENT:     Nose: Nose normal.  Eyes:     General: No scleral icterus.    Conjunctiva/sclera: Conjunctivae normal.  Cardiovascular:     Rate and Rhythm: Normal rate and regular rhythm.     Heart sounds: No murmur heard. Pulmonary:     Effort: Pulmonary effort is normal.     Breath sounds: No stridor. No wheezing, rhonchi or rales.  Abdominal:     General: Abdomen is scaphoid. Bowel sounds are normal. There is no distension.     Palpations: Abdomen is soft. There is no hepatomegaly, splenomegaly or mass.  Musculoskeletal:        General: Normal range of motion.     Cervical back: Neck supple.  Lymphadenopathy:      Cervical: No cervical adenopathy.  Skin:    Coloration: Skin is pale.  Neurological:     General: No focal deficit present.    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 EXAM: NUCLEAR MEDICINE PET SKULL BASE TO THIGH TECHNIQUE: 3.4 mCi gallium 68 DOTATATE was injected intravenously. Full-ring PET imaging was performed from the skull base to thigh after the radiotracer. CT data was obtained and used for attenuation correction and anatomic localization. COMPARISON:  Octreotide scan 04/24/2010. MRI of the abdomen 08/18/2010 FINDINGS: NECK No radiotracer activity in neck lymph nodes. Incidental CT findings: None CHEST No radiotracer accumulation within mediastinal or hilar lymph nodes. No suspicious pulmonary nodules on the CT scan. Incidental CT finding:None ABDOMEN/PELVIS Within the superior aspect of the RIGHT hepatic lobe rounded low-attenuation lesion is subtly seen on noncontrast CT exam measuring 3.0 x 3.7 cm. This subtle lesion on CT is represented by intense radiotracer activity on the DOTATATE PET scan with SUV max equal 34.11. Smaller lesion in the central LEFT hepatic lobe adjacent to the falciform ligament with SUV max equal 15.3. This lesion is not identified on the noncontrast CT. There is radiotracer avid thickening at the tail the pancreas with SUV max equal 15.3. No discrete lesion on CT portion exam. Subtle peritoneal nodule the lateral RIGHT peritoneal space  measuring 10 mm (169/series 3) with SUV max equal 13.7. Ventral peritoneal nodule midline with SUV max equal 13.1 (image 179). Physiologic activity within the small. Physiologic activity noted in the liver, spleen, adrenal glands and kidneys. Incidental CT findings:None SKELETON No focal activity to suggest skeletal metastasis. Incidental CT findings:None IMPRESSION: 1. 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 Smaller radiotracer avid lesion LEFT hepatic lobe. 2. Suspicion for nodule in the tail the pancreas with intense radiotracer activity. Differential includes metastatic lesion neuroendocrine tumor versus primary neuroendocrine tumor. 3. 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 4. No evidence of bowel obstruction. No focal lesion the bowel identified.  5. No skeletal metastasis. Electronically Signed   By: Suzy Bouchard M.D.   On: 08/07/2021 15:51    Assessment & Plan:   [place] was seen today for copd and anemia.  Diagnoses and all orders for this visit:  Simple chronic bronchitis (HCC) -     albuterol (VENTOLIN HFA) 108 (90 Base) MCG/ACT inhaler; Inhale 2 puffs into the lungs every 6 (six) hours as needed for wheezing or shortness of breath.  Essential hypertension- His BP is adequately well controlled.  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  Iron deficiency anemia due to chronic blood loss- Will continue the oral iron supplement.   I am having Josie Saunders. [place] start on albuterol. I am also having him maintain his multivitamin with minerals, Vitamin D (Ergocalciferol), Multiple Vitamins-Minerals (PRESERVISION AREDS 2 PO), aspirin, LANREOTIDE ACETATE [place], iron polysaccharides, midodrine, Ensure Plus, multivitamin, fludrocortisone, potassium chloride SA, dronabinol, Symbicort, and mirtazapine.  Meds ordered this encounter  Medications    albuterol (VENTOLIN HFA) 108 (90 Base) MCG/ACT inhaler    Sig: Inhale 2 puffs into the lungs every 6 (six) hours as needed for wheezing or shortness of breath.    Dispense:  18 g    Refill:  5     Follow-up: Return in about 6 months (around 03/04/2022).  Scarlette Calico, MD

## 2021-09-04 ENCOUNTER — Ambulatory Visit (INDEPENDENT_AMBULATORY_CARE_PROVIDER_SITE_OTHER): Payer: Medicare Other | Admitting: *Deleted

## 2021-09-04 DIAGNOSIS — I4891 Unspecified atrial fibrillation: Secondary | ICD-10-CM

## 2021-09-04 DIAGNOSIS — I10 Essential (primary) hypertension: Secondary | ICD-10-CM

## 2021-09-04 NOTE — Patient Instructions (Addendum)
Visit Information  Alan [name], thank you for taking time to talk with me today. Please don't hesitate to contact me if I can be of assistance to you before our next scheduled telephone appointment  Below are the goals we discussed today:  Patient Self-Care Activities: Patient Alan [name] will: Take medications as prescribed Attend all scheduled provider appointments Call pharmacy for medication refills Call provider office for new concerns or questions Take effort to follow heart healthy, low salt, low cholesterol diet Continue your efforts to prevent falls and syncopal (fainting) episodes: continue using your cane/ walker/ wheelchair and taking the measures you have initiated at home to stay safe in the shower and at night when you wake up to go the bathroom: I am so glad to hear that you have not had any recent falls or passing out episodes-- you are doing a great job making changes and using the strategies we have been discussing to prevent falls Continue to check your blood pressures regularly at home- please write down your blood pressures and the details of any dizziness you experience, so you can share this information with your cardiologist at your next office visit If you begin driving again, please start out very conservatively, go short distances only and have someone with you-- please talk to your cardiologist at your next office visit about whether it is okay for you to resume driving  Our next scheduled telephone follow up visit/ appointment is scheduled on:   Friday, Oct 31, 2021 at 11:30 am- This is a PHONE [place] appointment  If you need to cancel or re-schedule our visit, please call [phone] and our care guide team will be happy to assist you.   I look forward to hearing about your progress.   [name], RN, BSN, [place] [phone]: direct office  If you are experiencing a Mental Health or [place] or need someone to talk to, please  call the Suicide and Crisis Lifeline: 988 call the [place] National Suicide Prevention Lifeline: (267)844-4659 or TTY: 289-189-2588 TTY (701) 835-9285) to talk to a trained counselor call 1-800-273-TALK (toll free, 24 hour hotline) go to [facility] [address], [place] [phone]) call 911   Patient verbalizes understanding of instructions and care plan provided today and agrees to view in [place]. Active MyChart status confirmed with patient  Gout Gout is painful swelling of your joints. Gout is a type of arthritis. It is caused by having too much uric acid in your body. Uric acid is a chemical that is made when your body breaks down substances called purines. If your body has too much uric acid, sharp crystals can form and build up in your joints. This causes pain and swelling. Gout attacks can happen quickly and be very painful (acute gout). Over time, the attacks can affect more joints and happen more often (chronic gout). What are the causes? Too much uric acid in your blood. This can happen because: Your kidneys do not remove enough uric acid from your blood. Your body makes too much uric acid. You eat too many foods that are high in purines. These foods include organ meats, some seafood, and beer. Trauma or stress. What increases the risk? Having a family history of gout. Being male and middle-aged. Being male and having gone through menopause. Being very overweight (obese). Drinking alcohol, especially beer. Not having enough water in the body (being dehydrated). Losing weight too quickly.  Having an organ transplant. Having lead poisoning. Taking certain medicines. Having kidney disease. Having a skin condition called psoriasis. What are the signs or symptoms? An attack of acute gout usually happens in just one joint. The most common place is the big toe. Attacks often start at night. Other  joints that may be affected include joints of the feet, ankle, knee, fingers, wrist, or elbow. Symptoms of an attack may include: Very bad pain. Warmth. Swelling. Stiffness. Shiny, red, or purple skin. Tenderness. The affected joint may be very painful to touch. Chills and fever. Chronic gout may cause symptoms more often. More joints may be involved. You may also have white or yellow lumps (tophi) on your hands or feet or in other areas near your joints. How is this treated? Treatment for this condition has two phases: treating an acute attack and preventing future attacks. Acute gout treatment may include: NSAIDs. Steroids. These are taken by mouth or injected into a joint. Colchicine. This medicine relieves pain and swelling. It can be given by mouth or through an IV tube. Preventive treatment may include: Taking small doses of NSAIDs or colchicine daily. Using a medicine that reduces uric acid levels in your blood. Making changes to your diet. You may need to see a food expert (dietitian) about what to eat and drink to prevent gout. Follow these instructions at home: During a gout attack  If told, put ice on the painful area: Put ice in a plastic bag. Place a towel between your skin and the bag. Leave the ice on for 20 minutes, 2-3 times a day. Raise (elevate) the painful joint above the level of your heart as often as you can. Rest the joint as much as possible. If the joint is in your leg, you may be given crutches. Follow instructions from your doctor about what you cannot eat or drink. Avoiding future gout attacks Eat a low-purine diet. Avoid foods and drinks such as: Liver. Kidney. Anchovies. Asparagus. Herring. Mushrooms. Mussels. Beer. Stay at a healthy weight. If you want to lose weight, talk with your doctor. Do not lose weight too fast. Start or continue an exercise plan as told by your doctor. Eating and drinking Drink enough fluids to keep your pee (urine)  pale yellow. If you drink alcohol: Limit how much you use to: 0-1 drink a day for women. 0-2 drinks a day for men. Be aware of how much alcohol is in your drink. In the [place] one drink equals one 12 oz bottle of beer (355 mL), one 5 oz glass of wine (148 mL), or one 1 oz glass of hard liquor (44 mL). General instructions Take over-the-counter and prescription medicines only as told by your doctor. Do not drive or use heavy machinery while taking prescription pain medicine. Return to your normal activities as told by your doctor. Ask your doctor what activities are safe for you. Keep all follow-up visits as told by your doctor. This is important. Contact a doctor if: You have another gout attack. You still have symptoms of a gout attack after 10 days of treatment. You have problems (side effects) because of your medicines. You have chills or a fever. You have burning pain when you pee (urinate). You have pain in your lower back or belly. Get help right away if: You have very bad pain. Your pain cannot be controlled. You cannot pee. Summary Gout is painful swelling of the joints. The most common site of pain is the big toe, but  it can affect other joints. Medicines and avoiding some foods can help to prevent and treat gout attacks. This information is not intended to replace advice given to you by your health care provider. Make sure you discuss any questions you have with your health care provider. Document Revised: 12/24/2017 Document Reviewed: 01/05/2018 Elsevier Patient Education  2022 [place].  Atrial Fibrillation Atrial fibrillation is a type of heartbeat that is irregular or fast. If you have this condition, your heart beats without any order. This makes it hard for your heart to pump blood in a normal way. Atrial fibrillation may come and go, or it may become a long-lasting problem. If this condition is not treated, it can put you at higher risk for stroke, heart failure,  and other heart problems. What are the causes? This condition may be caused by diseases that damage the heart. They include: High blood pressure. Heart failure. Heart valve disease. Heart surgery. Other causes include: Diabetes. Thyroid disease. Being overweight. Kidney disease. Sometimes the cause is not known. What increases the risk? You are more likely to develop this condition if: You are older. You smoke. You exercise often and very hard. You have a family history of this condition. You are a man. You use drugs. You drink a lot of alcohol. You have lung conditions, such as emphysema, pneumonia, or COPD. You have sleep apnea. What are the signs or symptoms? Common symptoms of this condition include: A feeling that your heart is beating very fast. Chest pain or discomfort. Feeling short of breath. Suddenly feeling light-headed or weak. Getting tired easily during activity. Fainting. Sweating. In some cases, there are no symptoms. How is this treated? Treatment for this condition depends on underlying conditions and how you feel when you have atrial fibrillation. They include: Medicines to: Prevent blood clots. Treat heart rate or heart rhythm problems. Using devices, such as a pacemaker, to correct heart rhythm problems. Doing surgery to remove the part of the heart that sends bad signals. Closing an area where clots can form in the heart (left atrial appendage). In some cases, your doctor will treat other underlying conditions. Follow these instructions at home: Medicines Take over-the-counter and prescription medicines only as told by your doctor. Do not take any new medicines without first talking to your doctor. If you are taking blood thinners: Talk with your doctor before you take any medicines that have aspirin or NSAIDs, such as ibuprofen, in them. Take your medicine exactly as told by your doctor. Take it at the same time each day. Avoid activities that  could hurt or bruise you. Follow instructions about how to prevent falls. Wear a bracelet that says you are taking blood thinners. Or, carry a card that lists what medicines you take. Lifestyle   Do not use any products that have nicotine or tobacco in them. These include cigarettes, e-cigarettes, and chewing tobacco. If you need help quitting, ask your doctor. Eat heart-healthy foods. Talk with your doctor about the right eating plan for you. Exercise regularly as told by your doctor. Do not drink alcohol. Lose weight if you are overweight. Do not use drugs, including cannabis. General instructions If you have a condition that causes breathing to stop for a short period of time (apnea), treat it as told by your doctor. Keep a healthy weight. Do not use diet pills unless your doctor says they are safe for you. Diet pills may make heart problems worse. Keep all follow-up visits as told by your  doctor. This is important. Contact a doctor if: You notice a change in the speed, rhythm, or strength of your heartbeat. You are taking a blood-thinning medicine and you get more bruising. You get tired more easily when you move or exercise. You have a sudden change in weight. Get help right away if:  You have pain in your chest or your belly (abdomen). You have trouble breathing. You have side effects of blood thinners, such as blood in your vomit, poop (stool), or pee (urine), or bleeding that cannot stop. You have any signs of a stroke. "BE FAST" is an easy way to remember the main warning signs: B - Balance. Signs are dizziness, sudden trouble walking, or loss of balance. E - Eyes. Signs are trouble seeing or a change in how you see. F - Face. Signs are sudden weakness or loss of feeling in the face, or the face or eyelid drooping on one side. A - Arms. Signs are weakness or loss of feeling in an arm. This happens suddenly and usually on one side of the body. S - Speech. Signs are sudden trouble  speaking, slurred speech, or trouble understanding what people say. T - Time. Time to call emergency services. Write down what time symptoms started. You have other signs of a stroke, such as: A sudden, very bad headache with no known cause. Feeling like you may vomit (nausea). Vomiting. A seizure. These symptoms may be an emergency. Do not wait to see if the symptoms will go away. Get medical help right away. Call your local emergency services (911 in the [place] Do not drive yourself to the hospital. Summary Atrial fibrillation is a type of heartbeat that is irregular or fast. You are at higher risk of this condition if you smoke, are older, have diabetes, or are overweight. Follow your doctor's instructions about medicines, diet, exercise, and follow-up visits. Get help right away if you have signs or symptoms of a stroke. Get help right away if you cannot catch your breath, or you have chest pain or discomfort. This information is not intended to replace advice given to you by your health care provider. Make sure you discuss any questions you have with your health care provider. Document Revised: 12/07/2018 Document Reviewed: 12/07/2018 Elsevier Patient Education  [place].

## 2021-09-04 NOTE — Chronic Care Management (AMB) (Signed)
Chronic Care Management   CCM RN Visit Note  09/04/2021 Name: Alan [name] MRN: [id number] DOB: 1940/12/13  Subjective: Alan [name] is a 81 y.o. year old male who is a primary care Alan of [name], MD. The care management team was consulted for assistance with disease management and care coordination needs.    Engaged with Alan by telephone for follow up visit in response to provider referral for case management and/or care coordination services.   Consent to Services:  The Alan was given information about Chronic Care Management services, agreed to services, and gave verbal consent prior to initiation of services.  Please see initial visit note for detailed documentation.  Alan agreed to services and verbal consent obtained.   Assessment: Review of Alan past medical history, allergies, medications, health status, including review of consultants reports, laboratory and other test data, was performed as part of comprehensive evaluation and provision of chronic care management services.   SDOH (Social Determinants of Health) assessments and interventions performed:  SDOH Interventions    Flowsheet Row Most Recent Value  SDOH Interventions   Food Insecurity Interventions Intervention Not Indicated  [Continues to deny food insecurity]  Transportation Interventions Intervention Not Indicated  [contionues to report that spouse provides transportation]      CCM Care Plan  Allergies  Allergen Reactions   Penicillins Hives and Shortness Of Breath   Lisinopril Itching   Outpatient Encounter Medications as of 09/04/2021  Medication Sig   albuterol (VENTOLIN HFA) 108 (90 Base) MCG/ACT inhaler Inhale 2 puffs into the lungs every 6 (six) hours as needed for wheezing or shortness of breath.   aspirin 81 MG EC tablet Take 81 mg by mouth daily. Swallow whole.   dronabinol (MARINOL) 2.5 MG capsule Take 1 capsule (2.5 mg total) by mouth 2 (two) times daily before a meal.    Ensure Plus (ENSURE PLUS) LIQD Take 237 mLs by mouth 2 (two) times daily between meals.   fludrocortisone (FLORINEF) 0.1 MG tablet Take 1 tablet (0.1 mg total) by mouth daily.   iron polysaccharides (NIFEREX) 150 MG capsule Take 1 capsule (150 mg total) by mouth 3 (three) times a week.   LANREOTIDE ACETATE [place] Inject 1 Dose into the skin every 30 (thirty) days.   midodrine (PROAMATINE) 10 MG tablet Take 1 tablet (10 mg total) by mouth 3 (three) times daily.   mirtazapine (REMERON) 7.5 MG tablet Take 1 tablet (7.5 mg total) by mouth daily.   Multiple Vitamin (MULTIVITAMIN WITH MINERALS) TABS Take 1 tablet by mouth daily.   Multiple Vitamins-Minerals (PRESERVISION AREDS 2 PO) Take 1 capsule by mouth daily.   multivitamin (ONE-A-DAY MEN'S) TABS tablet 1 tab   potassium chloride SA (KLOR-CON M) 20 MEQ tablet 72mq three times a day po for 3 days then 286m po BID   SYMBICORT 160-4.5 MCG/ACT inhaler Inhale 2 puffs into the lungs daily as needed (wheezing).   Vitamin D, Ergocalciferol, (DRISDOL) 1.25 MG (50000 UNIT) CAPS capsule Take 50,000 Units by mouth every 7 (seven) days.   No facility-administered encounter medications on file as of 09/04/2021.   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   GIB (gastrointestinal bleeding) 01/18/2021   Essential hypertension 01/18/2021   Hypokalemia due to excessive gastrointestinal loss of potassium 01/18/2021   Chronic kidney disease, stage 3a ([place]) 01/18/2021   Conditions to be addressed/monitored:  Atrial  Fibrillation and HTN  Care Plan : RN Care Manager Plan of Care  Updates made by [name], RN since 09/04/2021 12:00 AM     Problem: Chronic Disease Management Needs   Priority: High     Long-Range Goal: Ongoing adherence to established plan of care for long term chronic disease management   Start Date: 07/02/2021  Expected End Date: 07/02/2022  Priority: High  Note:   Current Barriers:  Chronic Disease Management support and education needs related to Atrial Fibrillation and HTN Hospitalization October 25-27, 2022 for AF with RVR-- 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 History of frequent recent episodes syncope/ resulting in falls-- thus far without injury-- Alan reports 16- plus falls over last year, all without significant injury: Alan has/ uses walker, cane, wheelchair Update 09/04/21: Alan reports no new/ recent falls since mid January; confirms consistently using cane/ walker as indicated [facility]ing care from [facility] to [facility]: is well-established with current counseling provider-- but would like to have counseling services in [place]: will place CCM CSW referral for same Update 09/04/21: Alan reports established with psychiatric provider in [place]; had first office visit 08/19/21  [facility]- inconsistently wears hearing aids: does not like hearing aids, ambient noise-- encouraged to follow up with hearing aid provider to have adjustments as indicated Alan and spouse report that Alan has had recent memory issues  RNCM Clinical Goal(s):  Alan will demonstrate ongoing health management independence as evidenced by adherence to plan of care for A-Fib/ HTN with hypotensive syncopal episodes, presumably due to A-F        through collaboration with RN Care manager, provider, and care team.   Interventions: 1:1 collaboration with primary care provider regarding development and update of comprehensive plan of care as evidenced by provider attestation and  co-signature Inter-disciplinary care team collaboration (see longitudinal plan of care) Evaluation of current treatment plan related to  self management and Alan's adherence to plan as established by provider Initial assessment completed 07/02/21 09/04/21: Falls assessment updated: Alan reports no new/ recent falls since our last outreach 08/08/21-- continues using cane/ walker, managing showers at home with ongoing precautions in place to avoid falls; reports continues experiencing intermittent dizziness that is self-limiting and only lasts " a few minutes;" taking midodrine as prescribed; using strategies we have previously discussed to prevent falls when he feels dizzy/ lightheaded-- sits or lies down immediately, checks blood pressures, make sure phone is near him; gets assistance from spouse; positive reinforcement provided with encouragement to continue efforts Confirms he attended scheduled appointment at [facility] at [place] on 08/19/21 (Dr. Michail Sermon); states visit "went fine;" states currently working on coping strategies; encouraged Alan's ongoing engagement with CCM CSW as indicated Pain assessment updated: today he denies acute/ chronic pain; reports intermittent gout pain in big toe: discussed with PCP at office visit 09/01/21- Alan did not wish to be placed on medication for same: he will keep PCP updated on new issues/ concerns, and will let PCP know if he has "bad" flare and wishes to have medication called in Confirmed no current clinical concerns: Alan continues to sound audibly more upbeat than he did with initial outreach 07/02/21-- reports  he did take a trip to the beach in February, and Alan said "we had a great time and there was no problems while we were away" Reviewed recent PCP office visit 09/01/21 with Alan: confirms he obtained prescribed rescue inhaler: has not needed; reports was previously on this medication and he just wanted to have this  medication "on hand;" confirms he continues using maintenance inhaler QD as prescribed: denies issues/ concerns around breathing status today Confirmed no recent/ new medication changes, other than rescue inhaler as above-- Alan continues to endorse adherence to established medication regimen and denies concerns around medications today; spouse continues to assist with medication management at home Reviewed upcoming provider appointments with Alan: 09/09/21- psychiatric provider; 09/08/21 and 10/06/21- oncology provider; 10/24/21- cardiology provider; confirmed Alan is aware of all and has plans to attend as scheduled SDOH updated: no new concerns/ unmet needs identified  A-fib:  (Status: 09/04/21: Goal on Track (progressing): YES.) Long Term Goal  Advised Alan to discuss whether it is okay for him to resume driving: he remains unsure, although at last outreach, he had told me he was approved to start driving short distances: he has not yet started; encouraged him to revisit this with provider Counseled on importance of regular laboratory monitoring as prescribed Afib action plan reviewed/ reinforced Encouraged gradual/ conservative measures when approved to resume driving: caregiver/ wife reports she will assist Alan as he resumes driving and they will start with very short distances; this was encouraged Reinforced previously provided education around signs/ symptoms development A-Fib along corresponding action plan- today, Alan reports "a couple" of recent "fluttery sensations" in heart that spontaneously resolve and are self-limiting without specific intervention-- he denies shortness of breath or dizziness with these episodes: encouraged him to share this information with cardiology provider at time of upcoming scheduled office visit- he will do; I will provide additional educational material through [facility]- encouraged Alan to review Confirmed Alan continues to report trying to  follow "healthy low salt diet"  Hypertension: (Status: 09/04/21: Goal on Track (progressing): YES.) Long Term Goal  Last practice recorded BP readings:  BP Readings from Last 3 Encounters:  09/01/21 126/80  08/15/21 126/87  07/28/21 128/80  Most recent eGFR/CrCl: No results found for: EGFR  No components found for: CRCL  Evaluation of current treatment plan related to hypertension self management and Alan's adherence to plan as established by provider;   Discussed plans with Alan for ongoing care management follow up and provided Alan with direct contact information for care management team; Discussed complications of poorly controlled blood pressure such as heart disease, stroke, circulatory complications, vision complications, kidney impairment, sexual dysfunction;  Reviewed recent blood pressures at home:  they report blood pressures "overall better;" continues to experience short, intermittent episodes of low blood pressures with dizziness that resolve with conservative measures/ strategies of sitting/ lying down: reports occasional BP's at home "as low as 70/40," however, these blood pressures improve "back to normal" within "minutes" of sitting/ lying down Confirmed Alan continues taking midodrine QD; he has continued the plan he and spouse developed in early January around timing of midodrine administration in the afternoon/ evening hours, based on afternoon blood pressure values- this has continued working for them, and was encouraged Encouraged Alan to continues frequent blood pressure monitoring at home, and writing on paper to share with cardiology provider at upcoming scheduled office visit- he will do  Alan Goals/Self-Care Activities: As evidenced by review of EHR, collaboration with care team, and Alan reporting during  CCM RN CM outreach,  Alan [name] will: Take medications as prescribed Attend all scheduled provider appointments Call pharmacy for medication  refills Call provider office for new concerns or questions Take effort to follow heart healthy, low salt, low cholesterol diet Continue your efforts to prevent falls and syncopal (fainting) episodes: continue using your cane/ walker/ wheelchair and taking the measures you have initiated at home to stay safe in the shower and at night when you wake up to go the bathroom: I am so glad to hear that you have not had any recent falls or passing out episodes-- you are doing a great job making changes and using the strategies we have been discussing to prevent falls Continue to check your blood pressures regularly at home- please write down your blood pressures and the details of any dizziness you experience, so you can share this information with your cardiologist at your next office visit If you begin driving again, please start out very conservatively, go short distances only and have someone with you-- please talk to your cardiologist at your next office visit about whether it is okay for you to resume driving    Plan: Telephone follow up appointment with care management team member scheduled for: Friday, Oct 31, 2021 at 11:30 am The Alan has been provided with contact information for the care management team and has been advised to call with any health related questions or concerns  [name], RN, BSN, [place] [phone]: direct office

## 2021-09-08 ENCOUNTER — Other Ambulatory Visit: Payer: Self-pay

## 2021-09-08 ENCOUNTER — Inpatient Hospital Stay: Payer: Medicare Other

## 2021-09-08 ENCOUNTER — Inpatient Hospital Stay: Payer: Medicare Other | Attending: Hematology

## 2021-09-08 VITALS — BP 152/95 | HR 79 | Temp 98.2°F | Resp 17

## 2021-09-08 DIAGNOSIS — C7A019 Malignant carcinoid tumor of the small intestine, unspecified portion: Secondary | ICD-10-CM | POA: Diagnosis not present

## 2021-09-08 DIAGNOSIS — C7B02 Secondary carcinoid tumors of liver: Secondary | ICD-10-CM | POA: Diagnosis not present

## 2021-09-08 DIAGNOSIS — C7B04 Secondary carcinoid tumors of peritoneum: Secondary | ICD-10-CM | POA: Insufficient documentation

## 2021-09-08 LAB — CBC WITH DIFFERENTIAL/PLATELET
Abs Immature Granulocytes: 0.02 10*3/uL (ref 0.00–0.07)
Basophils Absolute: 0 10*3/uL (ref 0.0–0.1)
Basophils Relative: 0 %
Eosinophils Absolute: 0.2 10*3/uL (ref 0.0–0.5)
Eosinophils Relative: 2 %
HCT: 30.6 % — ABNORMAL LOW (ref 39.0–52.0)
Hemoglobin: 10.7 g/dL — ABNORMAL LOW (ref 13.0–17.0)
Immature Granulocytes: 0 %
Lymphocytes Relative: 23 %
Lymphs Abs: 2 10*3/uL (ref 0.7–4.0)
MCH: 31.1 pg (ref 26.0–34.0)
MCHC: 35 g/dL (ref 30.0–36.0)
MCV: 89 fL (ref 80.0–100.0)
Monocytes Absolute: 0.8 10*3/uL (ref 0.1–1.0)
Monocytes Relative: 9 %
Neutro Abs: 5.8 10*3/uL (ref 1.7–7.7)
Neutrophils Relative %: 66 %
Platelets: 188 10*3/uL (ref 150–400)
RBC: 3.44 MIL/uL — ABNORMAL LOW (ref 4.22–5.81)
RDW: 13.7 % (ref 11.5–15.5)
WBC: 8.8 10*3/uL (ref 4.0–10.5)
nRBC: 0 % (ref 0.0–0.2)

## 2021-09-08 LAB — CMP (CANCER CENTER ONLY)
ALT: 30 U/L (ref 0–44)
AST: 26 U/L (ref 15–41)
Albumin: 3.9 g/dL (ref 3.5–5.0)
Alkaline Phosphatase: 55 U/L (ref 38–126)
Anion gap: 5 (ref 5–15)
BUN: 42 mg/dL — ABNORMAL HIGH (ref 8–23)
CO2: 25 mmol/L (ref 22–32)
Calcium: 9.2 mg/dL (ref 8.9–10.3)
Chloride: 112 mmol/L — ABNORMAL HIGH (ref 98–111)
Creatinine: 2.09 mg/dL — ABNORMAL HIGH (ref 0.61–1.24)
GFR, Estimated: 31 mL/min — ABNORMAL LOW (ref >60)
Glucose, Bld: 124 mg/dL — ABNORMAL HIGH (ref 70–99)
Potassium: 3.8 mmol/L (ref 3.5–5.1)
Sodium: 142 mmol/L (ref 135–145)
Total Bilirubin: 0.6 mg/dL (ref 0.3–1.2)
Total Protein: 6.8 g/dL (ref 6.5–8.1)

## 2021-09-08 MED ORDER — LANREOTIDE ACETATE 120 MG/0.5ML ~~LOC~~ SOLN
120.0000 mg | Freq: Once | SUBCUTANEOUS | Status: AC
  Administered 2021-09-08: 120 mg via SUBCUTANEOUS
  Filled 2021-09-08: qty 120

## 2021-09-09 ENCOUNTER — Ambulatory Visit (INDEPENDENT_AMBULATORY_CARE_PROVIDER_SITE_OTHER): Payer: Medicare Other | Admitting: Psychologist

## 2021-09-09 DIAGNOSIS — F411 Generalized anxiety disorder: Secondary | ICD-10-CM

## 2021-09-09 DIAGNOSIS — F331 Major depressive disorder, recurrent, moderate: Secondary | ICD-10-CM

## 2021-09-09 NOTE — Progress Notes (Signed)
[facility] Counselor/Therapist Progress Note ? ?Patient ID: Alan [name], MRN: [id number],   ? ?Date: 09/09/2021 ? ?Time Spent: 1:08 pm to 1:46 pm; total time: 38 minutes ? ? This session was held via in person. The patient consented to in-person therapy and was in the clinician's office. Limits of confidentiality were discussed with the patient.  ? ?Treatment Type: Individual Therapy ? ?Reported Symptoms: Anxiety ? ?Mental Status Exam: ?Appearance:  Well Groomed     ?Behavior: Appropriate  ?Motor: Normal  ?Speech/Language:  Clear and Coherent  ?Affect: Appropriate  ?Mood: normal  ?Thought process: normal  ?Thought content:   WNL  ?Sensory/Perceptual disturbances:   WNL  ?Orientation: oriented to person, place, and time/date  ?Attention: Good  ?Concentration: Good  ?Memory: WNL  ?Fund of knowledge:  Good  ?Insight:   Fair  ?Judgment:  Fair  ?Impulse Control: Good  ? ?Risk Assessment: ?Danger to Self:  No ?Self-injurious Behavior: No ?Danger to Others: No ?Duty to Warn:no ?Physical Aggression / Violence:No  ?Access to Firearms a concern: No  ?Gang Involvement:No  ? ?Subjective: Beginning the session, patient stated that not much has changed since the intake. After reviewing the treatment plan, patient stated that he wanted coping skills. After discussing, practicing, and processing the coping skills the patient described himself as better. He was agreeable to homework and following up. He denied suicidal and homicidal ideation.   ? ?Interventions:  Worked on developing a therapeutic relationship with the patient using active listening and reflective statement. Provided emotional support using empathy and validation. Reviewed the treatment plan with the patient. Reviewed events since the intake. Normalized and validated expressed thoughts and emotions. Identified goals for the session. Provided psychoeducation about mindfulness, mindful moments, mindful videos, guided imagery, and defusion.   Practiced and processed mindfulness, guided imagery, and defusion. Praised patient for experiencing less distress. Assisted in problem solving. Assigned homework. Assessed for suicidal and homicidal ideation.  ? ?Homework: Implement mindfulness, guided imagery, and defusion ? ?Next Session: Review homework, emotional support. Process giving up control and fear of rejection ? 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 ?Plan:  ? ?Client Abilities: Friendly and easy to develop rapport ? ?Client Preferences: ACT and CBT ? ?Client statement of Needs: Coping skills and emotional support ? ?Treatment Level:Outpatient  ? ?Goals ?Alleviate depressive symptoms ?Recognize, accept, and cope with depressive feelings ?Develop healthy thinking patterns ?Develop healthy interpersonal relationships ?Reduce overall frequency, intensity, and duration of anxiety ?Stabilize anxiety level wile increasing ability to function ?Enhance ability to effectively cope with full variety of stressors ?Learn and implement coping skills that result in a reduction of anxiety  ? ?Objectives target date for all objectives is 08/19/2022 ?Verbalize an understanding of the cognitive, physiological, and behavioral components of anxiety ?Learning and implement calming skills to reduce overall anxiety ?Verbalize an understanding of the role that cognitive biases play in excessive irrational worry and persistent anxiety symptoms ?Identify, challenge, and replace based fearful talk ?Learn and implement problem solving strategies ?Identify and engage in pleasant activities ?Learning and implement personal and interpersonal skills to reduce anxiety and improve interpersonal relationships ?Learn to accept limitations in life and commit to tolerating, rather than avoiding, unpleasant emotions while accomplishing meaningful goals ?Identify major life conflicts from the past and present that  form the basis for present anxiety ?Maintain involvement in work, family, and social activities ?Reestablish a consistent sleep-wake cycle ?Cooperate with a medical evaluation  ?Cooperate with a  medication evaluation by a physician ?Verbalize an accurate understanding of depression ?Verbalize an understanding of the treatment ?Identify and replace thoughts that support depression ?Learn and implement behavioral strategies ?Verbalize an understanding and resolution of current interpersonal problems ?Learn and implement problem solving and decision making skills ?Learn and implement conflict resolution skills to resolve interpersonal problems ?Verbalize an understanding of healthy and unhealthy emotions verbalize insight into how past relationships may be influence current experiences with depression ?Use mindfulness and acceptance strategies and increase value based behavior  ?Increase hopeful statements about the future.  ?Interventions ?Engage the patient in behavioral activation ?Use instruction, modeling, and role-playing to build the client's general social, communication, and/or conflict resolution skills ?Use Acceptance and Commitment Therapy to help client accept uncomfortable realities in order to accomplish value-consistent goals ?Reinforce the client's insight into the role of his/her past emotional pain and present anxiety  ?Support the client in following through with work, family, and social activities ?Teach and implement sleep hygiene practices  ?Refer the patient to a physician for a psychotropic medication consultation ?Monito the clint's psychotropic medication compliance ?Discuss how anxiety typically involves excessive worry, various bodily expressions of tension, and avoidance of what is threatening that interact to maintain the problem  ?Teach the patient relaxation skills ?Assign the patient homework ?Discuss examples demonstrating that unrealistic worry overestimates the probability of  threats and underestimates patient's ability  ?Assist the patient in analyzing his or her worries ?Help patient understand that avoidance is reinforcing  ?Consistent with treatment model, discuss how change in cognitive, behavioral, and interpersonal can help client alleviate depression ?CBT ?Behavioral activation help the client explore the relationship, nature of the dispute,  ?Help the client develop new interpersonal skills and relationships ?Conduct Problem solving therapy ?Teach conflict resolution skills ?Use a process-experiential approach ?Conduct TLDP ?Conduct ACT ?Evaluate need for psychotropic medication ?Monitor adherence to medication  ? ?The patient and clinician reviewed the treatment plan on 09/09/2021. The patient approved of the treatment plan.  ? ? ? ? ? ? ?Conception Chancy, PsyD ? ? ? ?

## 2021-09-09 NOTE — Progress Notes (Signed)
                Alan Zwilling, PsyD 

## 2021-09-11 LAB — CHROMOGRANIN A: Chromogranin A (ng/mL): 1614 ng/mL — ABNORMAL HIGH (ref 0.0–101.8)

## 2021-09-16 ENCOUNTER — Other Ambulatory Visit: Payer: Self-pay

## 2021-09-19 ENCOUNTER — Other Ambulatory Visit: Payer: Self-pay | Admitting: Hematology

## 2021-09-19 DIAGNOSIS — C7A019 Malignant carcinoid tumor of the small intestine, unspecified portion: Secondary | ICD-10-CM

## 2021-09-22 ENCOUNTER — Encounter: Payer: Self-pay | Admitting: Hematology

## 2021-09-22 ENCOUNTER — Other Ambulatory Visit: Payer: Self-pay | Admitting: Hematology

## 2021-09-22 DIAGNOSIS — C9000 Multiple myeloma not having achieved remission: Secondary | ICD-10-CM

## 2021-09-22 DIAGNOSIS — C7A019 Malignant carcinoid tumor of the small intestine, unspecified portion: Secondary | ICD-10-CM

## 2021-09-23 ENCOUNTER — Other Ambulatory Visit: Payer: Self-pay

## 2021-09-23 ENCOUNTER — Encounter: Payer: Self-pay | Admitting: Hematology and Oncology

## 2021-09-23 DIAGNOSIS — H353132 Nonexudative age-related macular degeneration, bilateral, intermediate dry stage: Secondary | ICD-10-CM | POA: Diagnosis not present

## 2021-09-23 DIAGNOSIS — H43813 Vitreous degeneration, bilateral: Secondary | ICD-10-CM | POA: Diagnosis not present

## 2021-09-23 DIAGNOSIS — H40013 Open angle with borderline findings, low risk, bilateral: Secondary | ICD-10-CM | POA: Diagnosis not present

## 2021-09-23 DIAGNOSIS — H04123 Dry eye syndrome of bilateral lacrimal glands: Secondary | ICD-10-CM | POA: Diagnosis not present

## 2021-09-23 DIAGNOSIS — C7A019 Malignant carcinoid tumor of the small intestine, unspecified portion: Secondary | ICD-10-CM

## 2021-09-25 ENCOUNTER — Other Ambulatory Visit: Payer: Self-pay | Admitting: Hematology

## 2021-09-25 ENCOUNTER — Ambulatory Visit (HOSPITAL_COMMUNITY)
Admission: RE | Admit: 2021-09-25 | Discharge: 2021-09-25 | Disposition: A | Discharge status: Home / Self Care | Payer: Medicare Other | Source: Ambulatory Visit | Attending: Hematology | Admitting: Hematology

## 2021-09-25 DIAGNOSIS — C7A019 Malignant carcinoid tumor of the small intestine, unspecified portion: Secondary | ICD-10-CM

## 2021-09-25 DIAGNOSIS — C7A1 Malignant poorly differentiated neuroendocrine tumors: Secondary | ICD-10-CM | POA: Diagnosis not present

## 2021-09-25 DIAGNOSIS — N289 Disorder of kidney and ureter, unspecified: Secondary | ICD-10-CM | POA: Diagnosis not present

## 2021-09-25 NOTE — Consult Note (Signed)
? ? ?Chief Complaint: ?Patient with metastatic neuroendocrine tumor was for  evaluation Peptide receptor radiotherapy (PRRT) with [place] (Lutathera). ? ?Referring Physician(s):Kale ? ? ? ?Patient Status: [place] ? ?History of Present Illness: ?Alan [name] is a 81 y.o. 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  Original diagnosis in 2011 with partial small bowel obstruction and subsequent small-bowel resection revealing neuroendocrine tumor.  Subsequent liver metastasis identified in 2011.  Patient initially started a short trial of octreotide injections.  Patient terminated octreotide injections initially for adverse effects.  More recently, patient has Re -initiated octreotide injections with lanreotide injection every month for 1 year time. ?  ?Patient has well differentiated neuroendocrine tumor metastasis identified on DOTATATE PET scan 08/07/2021.  Evidence of progression on the DOTATATE PET scan with disease in the liver and peritoneum. ?  ?Patient continues to have significant diarrhea. ?  ?Patient has chromogranin levels which are significantly elevated at 1600.] ?  ?Patient is significant renal insufficiency.  (Creatinine equal 2.0.)  ? ?Past Medical History:  ?Diagnosis Date  ? Asthma   ? Carcinoid tumor, small intestine, malignant ([place])   ? Chronic diarrhea   ? CKD (chronic kidney disease) stage 3, GFR 30-59 ml/min (HCC)   ? HTN (hypertension)   ? Short gut syndrome   ? ? ?Past Surgical History:  ?Procedure Laterality Date  ? COLON SURGERY    ? 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  ? Procedure: PACEMAKER IMPLANT;  Surgeon: Constance Haw, MD;  Location: [facility];  Service: Cardiovascular;  Laterality: N/A;  ? ? ?Allergies: ?Penicillins and Lisinopril ? ?Medications: ?Prior to Admission medications   ?Medication Sig Start Date End Date Taking? Authorizing Provider  ?albuterol (VENTOLIN HFA) 108 (90 Base) MCG/ACT inhaler Inhale 2 puffs into  the lungs every 6 (six) hours as needed for wheezing or shortness of breath. 09/01/21   [name], MD  ?aspirin 81 MG EC tablet Take 81 mg by mouth daily. Swallow whole.    [provider]  ?dronabinol (MARINOL) 2.5 MG capsule Take 1 capsule (2.5 mg total) by mouth 2 (two) times daily before a meal. 08/15/21   Brunetta Genera, MD  ?Ensure Plus (ENSURE PLUS) LIQD Take 237 mLs by mouth 2 (two) times daily between meals.    [provider]  ?fludrocortisone (FLORINEF) 0.1 MG tablet Take 1 tablet (0.1 mg total) by mouth daily. 05/15/21   Jerline Pain, MD  ?iron polysaccharides (NIFEREX) 150 MG capsule TAKE 1 CAPSULE(150 MG) BY MOUTH 3 TIMES A WEEK 09/22/21   Brunetta Genera, MD  ?LANREOTIDE ACETATE [place] Inject 1 Dose into the skin every 30 (thirty) days.    [provider]  ?midodrine (PROAMATINE) 10 MG tablet Take 1 tablet (10 mg total) by mouth 3 (three) times daily. 04/21/21   Jerline Pain, MD  ?mirtazapine (REMERON) 7.5 MG tablet Take 1 tablet (7.5 mg total) by mouth daily. 08/19/21   [name], MD  ?Multiple Vitamin (MULTIVITAMIN WITH MINERALS) TABS Take 1 tablet by mouth daily.    [provider]  ?Multiple Vitamins-Minerals (PRESERVISION AREDS 2 PO) Take 1 capsule by mouth daily.    [provider]  ?multivitamin (ONE-A-DAY MEN'S) TABS tablet 1 tab    [provider]  ?potassium chloride SA (KLOR-CON M) 20 MEQ tablet Take 1 tablet twice daily. 09/19/21   Brunetta Genera, MD  ?Dellis Anes 160-4.5 MCG/ACT inhaler Inhale 2 puffs into the lungs daily as  needed (wheezing). 08/18/21   [name], MD  ?Vitamin D, Ergocalciferol, (DRISDOL) 1.25 MG (50000 UNIT) CAPS capsule Take 50,000 Units by mouth every 7 (seven) days. 07/08/20   [provider]  ?  ? ?Family History  ?Problem Relation Age of Onset  ? Alzheimer's disease Mother   ? ? ?Social History  ? ?Socioeconomic History  ? Marital status: Married  ?  Spouse name: Not on file  ?  Number of children: Not on file  ? Years of education: Not on file  ? Highest education level: Not on file  ?Occupational History  ? Not on file  ?Tobacco Use  ? Smoking status: Former  ?  Packs/day: 0.30  ?  Years: 61.00  ?  Pack years: 18.30  ?  Types: Cigarettes  ?  Start date: 26  ?  Quit date: 03/03/2020  ?  Years since quitting: 1.5  ? Smokeless tobacco: Never  ?Vaping Use  ? Vaping Use: Never used  ?Substance and Sexual Activity  ? Alcohol use: Not Currently  ? Drug use: No  ? Sexual activity: Not Currently  ?Other Topics Concern  ? Not on file  ?Social History Narrative  ? Not on file  ? ?Social Determinants of Health  ? ?Financial Resource Strain: Low Risk   ? Difficulty of Paying Living Expenses: Not very hard  ?Food Insecurity: No Food Insecurity  ? Worried About Charity fundraiser in the Last Year: Never true  ? Ran Out of Food in the Last Year: Never true  ?Transportation Needs: No Transportation Needs  ? Lack of Transportation (Medical): No  ? Lack of Transportation (Non-Medical): No  ?Physical Activity: Not on file  ?Stress: Stress Concern Present  ? Feeling of Stress : Very much  ?Social Connections: Not on file  ? ? ?ECOG Status: ?1 - Symptomatic but completely ambulatory ? ?Review of Systems: A 12 point ROS discussed and pertinent positives are indicated in the HPI above.  All other systems are negative. ? ?Review of Systems  ?Constitutional:  Positive for unexpected weight change.  ?Gastrointestinal:  Positive for diarrhea. Negative for vomiting.  ?Skin: Negative.   ?Psychiatric/Behavioral: Negative.    ? ?Vital Signs: ?There were no vitals taken for this visit. ? ?Physical Exam - deferred for COVID contact ? ?Imaging: ?NM PET (NETSPOT GA 42 DOTATATE) SKULL BASE TO MID THIGH ? ?Result Date: 08/07/2021 ?CLINICAL DATA:  Metastatic carcinoid tumor. Malignant carcinoid small intestine. EXAM: NUCLEAR MEDICINE PET SKULL BASE TO THIGH TECHNIQUE: 3.4 mCi gallium 68 DOTATATE was injected intravenously.  Full-ring PET imaging was performed from the skull base to thigh after the radiotracer. CT data was obtained and used for attenuation correction and anatomic localization. COMPARISON:  Octreotide scan 04/24/2010. MRI of the abdomen 08/18/2010 FINDINGS: NECK No radiotracer activity in neck lymph nodes. Incidental CT findings: None CHEST No radiotracer accumulation within mediastinal or hilar lymph nodes. No suspicious pulmonary nodules on the CT scan. Incidental CT finding:None ABDOMEN/PELVIS Within the superior aspect of the RIGHT hepatic lobe rounded low-attenuation lesion is subtly seen on noncontrast CT exam measuring 3.0 x 3.7 cm. This subtle lesion on CT is represented by intense radiotracer activity on the DOTATATE PET scan with SUV max equal 34.11. Smaller lesion in the central LEFT hepatic lobe adjacent to the falciform ligament with SUV max equal 15.3. This lesion is not identified on the noncontrast CT. There is radiotracer avid thickening at the tail the pancreas with SUV max equal 15.3. No  discrete lesion on CT portion exam. Subtle peritoneal nodule the lateral RIGHT peritoneal space measuring 10 mm (169/series 3) with SUV max equal 13.7. Ventral peritoneal nodule midline with SUV max equal 13.1 (image 179). Physiologic activity within the small. Physiologic activity noted in the liver, spleen, adrenal glands and kidneys. Incidental CT findings:None SKELETON No focal activity to suggest skeletal metastasis. Incidental CT findings:None IMPRESSION: 1. 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 Smaller radiotracer avid lesion LEFT hepatic lobe. 2. Suspicion for nodule in the tail the pancreas with intense radiotracer activity. Differential includes metastatic lesion neuroendocrine tumor versus primary neuroendocrine tumor. 3. 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 4. No evidence  of bowel obstruction. No focal lesion the bowel identified. 5. No skeletal metastasis. Electronically Signed   By: Suzy Bouchard M.D.   On: 08/07/2021 15:51   ? ?Labs: ? ?CBC: ?Recent Labs  ?  06/26/21 ?1

## 2021-09-26 DIAGNOSIS — I4891 Unspecified atrial fibrillation: Secondary | ICD-10-CM | POA: Diagnosis not present

## 2021-09-26 DIAGNOSIS — I10 Essential (primary) hypertension: Secondary | ICD-10-CM | POA: Diagnosis not present

## 2021-09-29 ENCOUNTER — Other Ambulatory Visit: Payer: Self-pay | Admitting: Hematology

## 2021-09-29 DIAGNOSIS — C7A019 Malignant carcinoid tumor of the small intestine, unspecified portion: Secondary | ICD-10-CM

## 2021-10-06 ENCOUNTER — Ambulatory Visit: Payer: Medicare Other

## 2021-10-06 ENCOUNTER — Other Ambulatory Visit: Payer: Medicare Other

## 2021-10-07 ENCOUNTER — Ambulatory Visit (INDEPENDENT_AMBULATORY_CARE_PROVIDER_SITE_OTHER): Payer: Medicare Other | Admitting: Psychologist

## 2021-10-07 DIAGNOSIS — F411 Generalized anxiety disorder: Secondary | ICD-10-CM | POA: Diagnosis not present

## 2021-10-07 DIAGNOSIS — F331 Major depressive disorder, recurrent, moderate: Secondary | ICD-10-CM

## 2021-10-07 NOTE — Progress Notes (Signed)
[facility] Counselor/Therapist Progress Note ? ?Patient ID: Alan [name], MRN: [id number],   ? ?Date: 10/07/2021 ? ?Time Spent: 1:08 pm to 1:47; total time: 39 minutes ? ? This session was held via in person. The patient consented to in-person therapy and was in the clinician's office. Limits of confidentiality were discussed with the patient.  ? ?Treatment Type: Individual Therapy ? ?Reported Symptoms: Less anxiety ? ?Mental Status Exam: ?Appearance:  Well Groomed     ?Behavior: Appropriate  ?Motor: Normal  ?Speech/Language:  Clear and Coherent  ?Affect: Appropriate  ?Mood: normal  ?Thought process: normal  ?Thought content:   WNL  ?Sensory/Perceptual disturbances:   WNL  ?Orientation: oriented to person, place, and time/date  ?Attention: Good  ?Concentration: Good  ?Memory: WNL  ?Fund of knowledge:  Good  ?Insight:   Fair  ?Judgment:  Fair  ?Impulse Control: Good  ? ?Risk Assessment: ?Danger to Self:  No ?Self-injurious Behavior: No ?Danger to Others: No ?Duty to Warn:no ?Physical Aggression / Violence:No  ?Access to Firearms a concern: No  ?Gang Involvement:No  ? ?Subjective: Beginning the session, patient stated that he is doing better voicing that coping strategies have assisted him. From there, he spent the session talking about how the progression of his cancer has facilitated him reflecting on death and dying. He reflected on different sub themes within the larger theme. He asked to follow up. He denied suicidal and homicidal ideation.   ? ?Interventions:  Worked on developing a therapeutic relationship with the patient using active listening and reflective statement. Provided emotional support using empathy and validation. Praised the patient for doing well and explored what has assisted the patient. Explored how the different coping strategies have assisted him. Identified goals for the session. Used socratic questions to assist the patient gain insight into self. Challenged some of the  thoughts expressed. Identified the theme of dying. Processed thoughts and emotions related to the sub themes of death and dying. Normalized and validated expressed thoughts and emotions. Provided empathic statements. Assessed for suicidal and homicidal ideation.  ? ?Homework: NA ? ?Next Session: Emotional support. Process different fears including that of dying and rejection.  ? 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 ?Plan:  ? ?Client Abilities: Friendly and easy to develop rapport ? ?Client Preferences: ACT and CBT ? ?Client statement of Needs: Coping skills and emotional support ? ?Treatment Level:Outpatient  ? ?Goals ?Alleviate depressive symptoms ?Recognize, accept, and cope with depressive feelings ?Develop healthy thinking patterns ?Develop healthy interpersonal relationships ?Reduce overall frequency, intensity, and duration of anxiety ?Stabilize anxiety level wile increasing ability to function ?Enhance ability to effectively cope with full variety of stressors ?Learn and implement coping skills that result in a reduction of anxiety  ? ?Objectives target date for all objectives is 08/19/2022 ?Verbalize an understanding of the cognitive, physiological, and behavioral components of anxiety ?Learning and implement calming skills to reduce overall anxiety ?Verbalize an understanding of the role that cognitive biases play in excessive irrational worry and persistent anxiety symptoms ?Identify, challenge, and replace based fearful talk ?Learn and implement problem solving strategies ?Identify and engage in pleasant activities ?Learning and implement personal and interpersonal skills to reduce anxiety and improve interpersonal relationships ?Learn to accept limitations in life and commit to tolerating, rather than avoiding, unpleasant emotions while accomplishing meaningful goals ?Identify major life conflicts from the past and present  that form the basis for present anxiety ?Maintain involvement in work, family,  and social activities ?Reestablish a consistent sleep-wake cycle ?Cooperate with a medical evaluation  ?Cooperate with a medication evaluation by a physician ?Verbalize an accurate understanding of depression ?Verbalize an understanding of the treatment ?Identify and replace thoughts that support depression ?Learn and implement behavioral strategies ?Verbalize an understanding and resolution of current interpersonal problems ?Learn and implement problem solving and decision making skills ?Learn and implement conflict resolution skills to resolve interpersonal problems ?Verbalize an understanding of healthy and unhealthy emotions verbalize insight into how past relationships may be influence current experiences with depression ?Use mindfulness and acceptance strategies and increase value based behavior  ?Increase hopeful statements about the future.  ?Interventions ?Engage the patient in behavioral activation ?Use instruction, modeling, and role-playing to build the client's general social, communication, and/or conflict resolution skills ?Use Acceptance and Commitment Therapy to help client accept uncomfortable realities in order to accomplish value-consistent goals ?Reinforce the client's insight into the role of his/her past emotional pain and present anxiety  ?Support the client in following through with work, family, and social activities ?Teach and implement sleep hygiene practices  ?Refer the patient to a physician for a psychotropic medication consultation ?Monito the clint's psychotropic medication compliance ?Discuss how anxiety typically involves excessive worry, various bodily expressions of tension, and avoidance of what is threatening that interact to maintain the problem  ?Teach the patient relaxation skills ?Assign the patient homework ?Discuss examples demonstrating that unrealistic worry overestimates the probability of  threats and underestimates patient's ability  ?Assist the patient in analyzing his or her worries ?Help patient understand that avoidance is reinforcing  ?Consistent with treatment model, discuss how change in cognitive, behavioral, and interpersonal can help client alleviate depression ?CBT ?Behavioral activation help the client explore the relationship, nature of the dispute,  ?Help the client develop new interpersonal skills and relationships ?Conduct Problem solving therapy ?Teach conflict resolution skills ?Use a process-experiential approach ?Conduct TLDP ?Conduct ACT ?Evaluate need for psychotropic medication ?Monitor adherence to medication  ? ?The patient and clinician reviewed the treatment plan on 09/09/2021. The patient approved of the treatment plan.  ? ? ? ? ? ? ?Conception Chancy, PsyD ? ?

## 2021-10-13 ENCOUNTER — Inpatient Hospital Stay: Payer: Medicare Other

## 2021-10-13 ENCOUNTER — Other Ambulatory Visit: Payer: Self-pay

## 2021-10-13 ENCOUNTER — Inpatient Hospital Stay: Payer: Medicare Other | Attending: Hematology

## 2021-10-13 ENCOUNTER — Telehealth: Payer: Medicare Other

## 2021-10-13 ENCOUNTER — Inpatient Hospital Stay (HOSPITAL_BASED_OUTPATIENT_CLINIC_OR_DEPARTMENT_OTHER): Payer: Medicare Other | Admitting: Hematology

## 2021-10-13 VITALS — BP 132/85 | HR 18 | Temp 97.9°F | Resp 18 | Wt 136.5 lb

## 2021-10-13 DIAGNOSIS — R630 Anorexia: Secondary | ICD-10-CM

## 2021-10-13 DIAGNOSIS — I129 Hypertensive chronic kidney disease with stage 1 through stage 4 chronic kidney disease, or unspecified chronic kidney disease: Secondary | ICD-10-CM | POA: Insufficient documentation

## 2021-10-13 DIAGNOSIS — D509 Iron deficiency anemia, unspecified: Secondary | ICD-10-CM | POA: Diagnosis not present

## 2021-10-13 DIAGNOSIS — N183 Chronic kidney disease, stage 3 unspecified: Secondary | ICD-10-CM | POA: Diagnosis not present

## 2021-10-13 DIAGNOSIS — C7A019 Malignant carcinoid tumor of the small intestine, unspecified portion: Secondary | ICD-10-CM | POA: Insufficient documentation

## 2021-10-13 DIAGNOSIS — R634 Abnormal weight loss: Secondary | ICD-10-CM | POA: Diagnosis not present

## 2021-10-13 DIAGNOSIS — D500 Iron deficiency anemia secondary to blood loss (chronic): Secondary | ICD-10-CM

## 2021-10-13 DIAGNOSIS — F419 Anxiety disorder, unspecified: Secondary | ICD-10-CM

## 2021-10-13 LAB — CBC WITH DIFFERENTIAL/PLATELET
Abs Immature Granulocytes: 0.02 10*3/uL (ref 0.00–0.07)
Basophils Absolute: 0 10*3/uL (ref 0.0–0.1)
Basophils Relative: 1 %
Eosinophils Absolute: 0.2 10*3/uL (ref 0.0–0.5)
Eosinophils Relative: 2 %
HCT: 31.3 % — ABNORMAL LOW (ref 39.0–52.0)
Hemoglobin: 10.9 g/dL — ABNORMAL LOW (ref 13.0–17.0)
Immature Granulocytes: 0 %
Lymphocytes Relative: 28 %
Lymphs Abs: 2.2 10*3/uL (ref 0.7–4.0)
MCH: 30.9 pg (ref 26.0–34.0)
MCHC: 34.8 g/dL (ref 30.0–36.0)
MCV: 88.7 fL (ref 80.0–100.0)
Monocytes Absolute: 0.7 10*3/uL (ref 0.1–1.0)
Monocytes Relative: 9 %
Neutro Abs: 4.8 10*3/uL (ref 1.7–7.7)
Neutrophils Relative %: 60 %
Platelets: 177 10*3/uL (ref 150–400)
RBC: 3.53 MIL/uL — ABNORMAL LOW (ref 4.22–5.81)
RDW: 13.2 % (ref 11.5–15.5)
WBC: 7.9 10*3/uL (ref 4.0–10.5)
nRBC: 0 % (ref 0.0–0.2)

## 2021-10-13 LAB — CMP (CANCER CENTER ONLY)
ALT: 19 U/L (ref 0–44)
AST: 19 U/L (ref 15–41)
Albumin: 4.2 g/dL (ref 3.5–5.0)
Alkaline Phosphatase: 52 U/L (ref 38–126)
Anion gap: 7 (ref 5–15)
BUN: 47 mg/dL — ABNORMAL HIGH (ref 8–23)
CO2: 25 mmol/L (ref 22–32)
Calcium: 9.6 mg/dL (ref 8.9–10.3)
Chloride: 109 mmol/L (ref 98–111)
Creatinine: 2.45 mg/dL — ABNORMAL HIGH (ref 0.61–1.24)
GFR, Estimated: 26 mL/min — ABNORMAL LOW (ref >60)
Glucose, Bld: 110 mg/dL — ABNORMAL HIGH (ref 70–99)
Potassium: 4.5 mmol/L (ref 3.5–5.1)
Sodium: 141 mmol/L (ref 135–145)
Total Bilirubin: 0.7 mg/dL (ref 0.3–1.2)
Total Protein: 7.3 g/dL (ref 6.5–8.1)

## 2021-10-13 MED ORDER — LANREOTIDE ACETATE 120 MG/0.5ML ~~LOC~~ SOLN
120.0000 mg | Freq: Once | SUBCUTANEOUS | Status: AC
  Administered 2021-10-13: 120 mg via SUBCUTANEOUS
  Filled 2021-10-13: qty 120

## 2021-10-13 MED ORDER — DRONABINOL 2.5 MG PO CAPS
2.5000 mg | ORAL_CAPSULE | Freq: Two times a day (BID) | ORAL | 0 refills | Status: DC
Start: 2021-10-13 — End: 2021-10-23

## 2021-10-13 NOTE — Addendum Note (Signed)
Addended by: Sullivan Lone on: 10/13/2021 12:06 PM ? ? Modules accepted: Orders ? ?

## 2021-10-13 NOTE — Progress Notes (Signed)
? ? ?HEMATOLOGY/ONCOLOGY CLINIC NOTE ? ?Date of Service: .10/13/2021 ? ? ?Patient Care Team: ?[name], MD as PCP - General (Internal Medicine) ?Sueanne Margarita, MD as PCP - Cardiology (Cardiology) ?Constance Haw, MD as PCP - Electrophysiology (Cardiology) ?[name], RN as Case Manager ?Maurine Cane, LCSW as Education officer, museum (Licensed [employer] representative) ? ?CHIEF COMPLAINTS/PURPOSE OF CONSULTATION:  ?Follow-up for continued evaluation and management of metastatic small intestinal carcinoid tumor ? ?HISTORY OF PRESENTING ILLNESS:  ?Please see previous note for details on initial presentation ? ?INTERVAL HISTORY ? ?Alan [name] is here for continued evaluation and management of his small bowel metastatic carcinoid.  ?He reports no significant toxicities from his monthly lanreotide. ?Since his last clinic visit he has connected with Dr. Leonia Reeves and has been scheduled for dose reduced Lutathera in light of his significant diarrhea significant elevated chromogranin levels and PET positive disease and cancer related anorexia and loss of appetite and weight loss. ?We discussed that he will need to maintain his food intake and fluid intake and that his renal function has to be stable to be able to proceed with the treatment.  He has been scheduled for IV fluids to help flush his kidneys to reduce the risk of Lutathera nephrotoxicity in addition to dose reduction. ?He is still struggling significantly with depression and anxiety and this is confirmed by his wife who is here with him for his appointment. ?He sees Dr. Elias Else for psychological counseling but is on Remeron for depression by his primary care physician. ?We discussed that it would be imperative that if he feels that his depression and anxiety are causing him not to function at all that he discussed this with Dr. Michail Sermon and he might need psychiatry referral for optimization of medications. ? ?He has been eating poorly and has  lost 3 pounds over the last month.  He has already seen the dietitian and has recommendations regarding dietary changes but has not been able to implement this. ?He notes he just does not feel like eating.  Does not endorse any significant nausea or swallowing difficulties.  Also his diarrhea is much improved with taking Imodium more frequently. ? ?We discussed and patient is agreeable with trying Marinol for cancer related anorexia. ? ?MEDICAL HISTORY:  ?Past Medical History:  ?Diagnosis Date  ? Asthma   ? Carcinoid tumor, small intestine, malignant ([place])   ? Chronic diarrhea   ? CKD (chronic kidney disease) stage 3, GFR 30-59 ml/min (HCC)   ? HTN (hypertension)   ? Short gut syndrome   ? ? ?SURGICAL HISTORY: ?Past Surgical History:  ?Procedure Laterality Date  ? COLON SURGERY    ? 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  ? Procedure: PACEMAKER IMPLANT;  Surgeon: Constance Haw, MD;  Location: [facility];  Service: Cardiovascular;  Laterality: N/A;  ? ? ?SOCIAL HISTORY: ?Social History  ? ?Socioeconomic History  ? Marital status: Married  ?  Spouse name: Not on file  ? Number of children: Not on file  ? Years of education: Not on file  ? Highest education level: Not on file  ?Occupational History  ? Not on file  ?Tobacco Use  ? Smoking status: Former  ?  Packs/day: 0.30  ?  Years: 61.00  ?  Pack years: 18.30  ?  Types: Cigarettes  ?  Start date: 28  ?  Quit date: 03/03/2020  ?  Years since quitting: 1.6  ? Smokeless tobacco: Never  ?Vaping  Use  ? Vaping Use: Never used  ?Substance and Sexual Activity  ? Alcohol use: Not Currently  ? Drug use: No  ? Sexual activity: Not Currently  ?Other Topics Concern  ? Not on file  ?Social History Narrative  ? Not on file  ? ?Social Determinants of Health  ? ?Financial Resource Strain: Low Risk   ? Difficulty of Paying Living Expenses: Not very hard  ?Food Insecurity: No Food Insecurity  ? Worried About Charity fundraiser in the Last Year: Never true  ? Ran Out of Food in  the Last Year: Never true  ?Transportation Needs: No Transportation Needs  ? Lack of Transportation (Medical): No  ? Lack of Transportation (Non-Medical): No  ?Physical Activity: Not on file  ?Stress: Stress Concern Present  ? Feeling of Stress : Very much  ?Social Connections: Not on file  ?Intimate Partner Violence: Not on file  ? ? ?FAMILY HISTORY: ?Family History  ?Problem Relation Age of Onset  ? Alzheimer's disease Mother   ? ? ?ALLERGIES:  is allergic to penicillins and lisinopril. ? ?MEDICATIONS:  ?Current Outpatient Medications  ?Medication Sig Dispense Refill  ? albuterol (VENTOLIN HFA) 108 (90 Base) MCG/ACT inhaler Inhale 2 puffs into the lungs every 6 (six) hours as needed for wheezing or shortness of breath. 18 g 5  ? aspirin 81 MG EC tablet Take 81 mg by mouth daily. Swallow whole.    ? dronabinol (MARINOL) 2.5 MG capsule Take 1 capsule (2.5 mg total) by mouth 2 (two) times daily before a meal. 60 capsule 0  ? Ensure Plus (ENSURE PLUS) LIQD Take 237 mLs by mouth 2 (two) times daily between meals.    ? fludrocortisone (FLORINEF) 0.1 MG tablet Take 1 tablet (0.1 mg total) by mouth daily. 90 tablet 3  ? iron polysaccharides (NIFEREX) 150 MG capsule TAKE 1 CAPSULE(150 MG) BY MOUTH 3 TIMES A WEEK 30 capsule 2  ? LANREOTIDE ACETATE  Inject 1 Dose into the skin every 30 (thirty) days.    ? midodrine (PROAMATINE) 10 MG tablet Take 1 tablet (10 mg total) by mouth 3 (three) times daily. 270 tablet 3  ? mirtazapine (REMERON) 7.5 MG tablet Take 1 tablet (7.5 mg total) by mouth daily. 90 tablet 1  ? Multiple Vitamin (MULTIVITAMIN WITH MINERALS) TABS Take 1 tablet by mouth daily.    ? Multiple Vitamins-Minerals (PRESERVISION AREDS 2 PO) Take 1 capsule by mouth daily.    ? multivitamin (ONE-A-DAY MEN'S) TABS tablet 1 tab    ? potassium chloride SA (KLOR-CON M) 20 MEQ tablet Take 1 tablet twice daily. 60 tablet 1  ? SYMBICORT 160-4.5 MCG/ACT inhaler Inhale 2 puffs into the lungs daily as needed (wheezing). 3 each 1   ? Vitamin D, Ergocalciferol, (DRISDOL) 1.25 MG (50000 UNIT) CAPS capsule Take 50,000 Units by mouth every 7 (seven) days.    ? ?No current facility-administered medications for this visit.  ? ? ?REVIEW OF SYSTEMS:   ?10 Point review of Systems was done is negative except as noted above. ? ?PHYSICAL EXAMINATION: ?ECOG PERFORMANCE STATUS: 2 - Symptomatic, <50% confined to bed ? ?. ?Vitals:  ? 10/13/21 1000  ?BP: 132/85  ?Pulse: (!) 18  ?Resp: 18  ?Temp: 97.9 ?F (36.6 ?C)  ?SpO2: 99%  ? ?Filed Weights  ? 10/13/21 1000  ?Weight: 136 lb 8 oz (61.9 kg)  ? ?.Body mass index is 18.01 kg/m?Marland Kitchen ?NAD ?GENERAL:alert, in no acute distress and comfortable ?SKIN: no acute rashes, no significant lesions ?  EYES: conjunctiva are pink and non-injected, sclera anicteric ?OROPHARYNX: MMM, no exudates, no oropharyngeal erythema or ulceration ?NECK: supple, no JVD ?LYMPH:  no palpable lymphadenopathy in the cervical, axillary or inguinal regions ?LUNGS: clear to auscultation b/l with normal respiratory effort ?HEART: regular rate & rhythm ?ABDOMEN:  normoactive bowel sounds , non tender, not distended. ?Extremity: no pedal edema ?PSYCH: alert & oriented x 3 with fluent speech ?NEURO: no focal motor/sensory deficits ? ? ?LABORATORY DATA:  ?I have reviewed the data as listed ? ? ?  Latest Ref Rng & Units 10/13/2021  ?  9:37 AM 09/08/2021  ?  2:55 PM 08/15/2021  ?  9:37 AM  ?CBC  ?WBC 4.0 - 10.5 K/uL 7.9   8.8   7.2    ?Hemoglobin 13.0 - 17.0 g/dL 10.9   10.7   11.1    ?Hematocrit 39.0 - 52.0 % 31.3   30.6   32.0    ?Platelets 150 - 400 K/uL 177   188   205    ? ? ? ?  Latest Ref Rng & Units 10/13/2021  ?  9:37 AM 09/08/2021  ?  2:55 PM 08/15/2021  ?  9:37 AM  ?CMP  ?Glucose 70 - 99 mg/dL 110   124   140    ?BUN 8 - 23 mg/dL 47   42   33    ?Creatinine 0.61 - 1.24 mg/dL 2.45   2.09   1.98    ?Sodium 135 - 145 mmol/L 141   142   140    ?Potassium 3.5 - 5.1 mmol/L 4.5   3.8   4.0    ?Chloride 98 - 111 mmol/L 109   112   109    ?CO2 22 - 32 mmol/L '25    25   26    '$ ?Calcium 8.9 - 10.3 mg/dL 9.6   9.2   9.6    ?Total Protein 6.5 - 8.1 g/dL 7.3   6.8   7.0    ?Total Bilirubin 0.3 - 1.2 mg/dL 0.7   0.6   0.5    ?Alkaline Phos 38 - 126 U/L 52   55   53

## 2021-10-14 ENCOUNTER — Telehealth: Payer: Self-pay

## 2021-10-14 ENCOUNTER — Ambulatory Visit (INDEPENDENT_AMBULATORY_CARE_PROVIDER_SITE_OTHER): Payer: Medicare Other | Admitting: Licensed Clinical Social Worker

## 2021-10-14 DIAGNOSIS — I10 Essential (primary) hypertension: Secondary | ICD-10-CM

## 2021-10-14 DIAGNOSIS — F329 Major depressive disorder, single episode, unspecified: Secondary | ICD-10-CM

## 2021-10-14 NOTE — Chronic Care Management (AMB) (Signed)
?Chronic Care Management  ? Clinical Social Work Note ? ?10/14/2021 ?Name: Alan [name] MRN: [id number] DOB: June 03, 1941 ? ?Alan [name] is a 81 y.o. year old male who is a primary care patient of [name], MD. The CCM team was consulted to assist the patient with chronic disease management and/or care coordination needs related to: [place] and Resources.  ? ?Engaged with patient by telephone for follow up visit in response to provider referral for social work chronic care management and care coordination services.  ? ?Consent to Services:  ?The patient was given information about Chronic Care Management services, agreed to services, and gave verbal consent prior to initiation of services.  Please see initial visit note for detailed documentation.  ? ?Patient agreed to services and consent obtained.  ?Summary: Assessed patient's current treatment, progress, coping skills, support system and barriers to care.  Patient was accompanied by wife who provided information during this encounter.Alan [name] He is making progress with managing symptoms of depression.  Continues to meet with Dr. Michail Sermon for therapy at [facility]. Per patient, Oncologist recommended he connect with psychiatry. See Care Plan below for interventions and patient self-care actives. ? ?Follow up Plan: Patient would like continued follow-up from CCM LCSW.  per patient's request will follow up in 60 day.  Will call office if needed prior to next encounter. ?  ?Assessment: Review of patient past medical history, allergies, medications, and health status, including review of relevant consultants reports was performed today as part of a comprehensive evaluation and provision of chronic care management and care coordination services.    ? ?SDOH (Social Determinants of Health) assessments and interventions performed:   ? ?Advanced Directives Status: Not addressed in this encounter. ? ?CCM Care Plan ?Conditions to be  addressed/monitored: Depression;  ? ?Care Plan : LCSW Plan of Care  ?Updates made by Maurine Cane, LCSW since 10/14/2021 12:00 AM  ?  ? ?Problem: Coping Skills unmanaged   ?  ? ?Goal: Coping Skills Enhanced by connecting with ongoing therapy   ?Start Date: 07/15/2021  ?Expected End Date: 11/25/2021  ?This Visit's Progress: On track  ?Recent Progress: On track  ?Priority: High  ?Note:   ?Current Barriers:  ?Disease Management support and education needs related to managing symptoms of depression ?Lacks knowledge of how to connect  ? ?CSW Clinical Goal(s):  ?Patient  will work with [place] to address needs related to depression  through collaboration with Clinical Social Worker, provider, and care team.  ? ?Interventions: ?1:1 collaboration with primary care provider regarding development and update of comprehensive plan of care as evidenced by provider attestation and co-signature ?Inter-disciplinary care team collaboration (see longitudinal plan of care) ?Evaluation of current treatment plan related to  self management and patient's adherence to plan as established by provider ?Review resources, discussed options and provided patient information about  ?Options for mental health treatment based on need and insurance ? ?Mental Health:  (Status: Goal on Track (progressing): YES.) ?Evaluation of current treatment plan related to Depression: depressed mood ?anxiety ?Solution-Focused Strategies employed:  ?Problem [place] strategies reviewed ?Provided psychoeducation for mental health needs  ?Discussed referral for psychiatry: provided information and discussed options ? ?Patient Self-Care Activities: ?Keep appointment with [place]   ?Call to see which is able to get you in for an appointment  ?[place] [phone] ?[place]:  [phone] ?[place]: [phone] ?[facility] : [phone]   ?  ?  ? ?  Casimer Lanius, LCSW ?Licensed Clinical Social Worker [employer] [employer]  ?[place]  ?[phone]  ? ?

## 2021-10-14 NOTE — Patient Instructions (Signed)
Visit Information ? ?Thank you for taking time to visit with me today. Please don't hesitate to contact me if I can be of assistance to you before our next scheduled telephone appointment. ? ?Following are the goals we discussed today: managing mental health needs ? ?Patient Self-Care Activities: ?Keep appointment with [place]   ?Call to see which is able to get you in for an appointment  ?[place] [phone] ?[place]:  [phone] ?[place]: [phone] ?[facility] : [phone]   ? ?Our next appointment is by telephone on June 19th at 11:00 ? ?Please call the care guide team at [phone] if you need to cancel or reschedule your appointment.  ? ?If you are experiencing a Mental Health or [place] or need someone to talk to, please call 1-800-273-TALK (toll free, 24 hour hotline)  ? ?Patient verbalizes understanding of instructions and care plan provided today and agrees to view in [place]. Active MyChart status confirmed with patient.   ? ?Casimer Lanius, LCSW ?Licensed Clinical Social Worker [employer]  ?[place]  ?[phone]  ?

## 2021-10-14 NOTE — Telephone Encounter (Signed)
Notified Patient of prior authorization approval for Dronabinol 2.5 mg capsules. Medication is authorized through 06/28/2022. No other needs or concerns voiced at this time. ?

## 2021-10-15 ENCOUNTER — Telehealth: Payer: Self-pay | Admitting: Hematology

## 2021-10-15 LAB — CHROMOGRANIN A: Chromogranin A (ng/mL): 1579 ng/mL — ABNORMAL HIGH (ref 0.0–101.8)

## 2021-10-15 NOTE — Telephone Encounter (Signed)
Scheduled follow-up appointment per 4/17 los. Patient is aware. ?

## 2021-10-19 ENCOUNTER — Emergency Department (HOSPITAL_COMMUNITY): Payer: Medicare Other

## 2021-10-19 ENCOUNTER — Other Ambulatory Visit: Payer: Self-pay

## 2021-10-19 ENCOUNTER — Encounter (HOSPITAL_COMMUNITY): Payer: Self-pay | Admitting: *Deleted

## 2021-10-19 ENCOUNTER — Inpatient Hospital Stay (HOSPITAL_COMMUNITY)
Admission: EM | Admit: 2021-10-19 | Discharge: 2021-10-24 | DRG: 683 | Disposition: A | Discharge status: Skilled Nursing Facility | Payer: Medicare Other | Attending: Internal Medicine | Admitting: Internal Medicine

## 2021-10-19 DIAGNOSIS — Z681 Body mass index (BMI) 19 or less, adult: Secondary | ICD-10-CM | POA: Diagnosis not present

## 2021-10-19 DIAGNOSIS — N1832 Chronic kidney disease, stage 3b: Secondary | ICD-10-CM | POA: Diagnosis not present

## 2021-10-19 DIAGNOSIS — E860 Dehydration: Secondary | ICD-10-CM | POA: Diagnosis present

## 2021-10-19 DIAGNOSIS — I129 Hypertensive chronic kidney disease with stage 1 through stage 4 chronic kidney disease, or unspecified chronic kidney disease: Secondary | ICD-10-CM | POA: Diagnosis not present

## 2021-10-19 DIAGNOSIS — M2550 Pain in unspecified joint: Secondary | ICD-10-CM | POA: Diagnosis not present

## 2021-10-19 DIAGNOSIS — K529 Noninfective gastroenteritis and colitis, unspecified: Secondary | ICD-10-CM | POA: Diagnosis present

## 2021-10-19 DIAGNOSIS — I4891 Unspecified atrial fibrillation: Secondary | ICD-10-CM | POA: Diagnosis not present

## 2021-10-19 DIAGNOSIS — R55 Syncope and collapse: Secondary | ICD-10-CM | POA: Diagnosis present

## 2021-10-19 DIAGNOSIS — J45909 Unspecified asthma, uncomplicated: Secondary | ICD-10-CM | POA: Diagnosis not present

## 2021-10-19 DIAGNOSIS — R1312 Dysphagia, oropharyngeal phase: Secondary | ICD-10-CM | POA: Diagnosis not present

## 2021-10-19 DIAGNOSIS — I480 Paroxysmal atrial fibrillation: Secondary | ICD-10-CM | POA: Diagnosis present

## 2021-10-19 DIAGNOSIS — Z820 Family history of epilepsy and other diseases of the nervous system: Secondary | ICD-10-CM

## 2021-10-19 DIAGNOSIS — I951 Orthostatic hypotension: Secondary | ICD-10-CM | POA: Diagnosis present

## 2021-10-19 DIAGNOSIS — R2689 Other abnormalities of gait and mobility: Secondary | ICD-10-CM | POA: Diagnosis not present

## 2021-10-19 DIAGNOSIS — Z7982 Long term (current) use of aspirin: Secondary | ICD-10-CM | POA: Diagnosis not present

## 2021-10-19 DIAGNOSIS — Z87891 Personal history of nicotine dependence: Secondary | ICD-10-CM | POA: Diagnosis not present

## 2021-10-19 DIAGNOSIS — K909 Intestinal malabsorption, unspecified: Secondary | ICD-10-CM | POA: Diagnosis not present

## 2021-10-19 DIAGNOSIS — W19XXXA Unspecified fall, initial encounter: Secondary | ICD-10-CM | POA: Diagnosis not present

## 2021-10-19 DIAGNOSIS — M6281 Muscle weakness (generalized): Secondary | ICD-10-CM | POA: Diagnosis not present

## 2021-10-19 DIAGNOSIS — G909 Disorder of the autonomic nervous system, unspecified: Secondary | ICD-10-CM | POA: Diagnosis not present

## 2021-10-19 DIAGNOSIS — N179 Acute kidney failure, unspecified: Principal | ICD-10-CM | POA: Diagnosis present

## 2021-10-19 DIAGNOSIS — F419 Anxiety disorder, unspecified: Secondary | ICD-10-CM | POA: Diagnosis present

## 2021-10-19 DIAGNOSIS — I495 Sick sinus syndrome: Secondary | ICD-10-CM | POA: Diagnosis present

## 2021-10-19 DIAGNOSIS — R6889 Other general symptoms and signs: Secondary | ICD-10-CM | POA: Diagnosis not present

## 2021-10-19 DIAGNOSIS — F329 Major depressive disorder, single episode, unspecified: Secondary | ICD-10-CM | POA: Diagnosis present

## 2021-10-19 DIAGNOSIS — Z7401 Bed confinement status: Secondary | ICD-10-CM | POA: Diagnosis not present

## 2021-10-19 DIAGNOSIS — Z79899 Other long term (current) drug therapy: Secondary | ICD-10-CM | POA: Diagnosis not present

## 2021-10-19 DIAGNOSIS — Z743 Need for continuous supervision: Secondary | ICD-10-CM | POA: Diagnosis not present

## 2021-10-19 DIAGNOSIS — R5381 Other malaise: Secondary | ICD-10-CM | POA: Diagnosis present

## 2021-10-19 DIAGNOSIS — C7A019 Malignant carcinoid tumor of the small intestine, unspecified portion: Secondary | ICD-10-CM | POA: Diagnosis present

## 2021-10-19 DIAGNOSIS — R64 Cachexia: Secondary | ICD-10-CM | POA: Diagnosis not present

## 2021-10-19 DIAGNOSIS — Z950 Presence of cardiac pacemaker: Secondary | ICD-10-CM

## 2021-10-19 DIAGNOSIS — R2681 Unsteadiness on feet: Secondary | ICD-10-CM | POA: Diagnosis not present

## 2021-10-19 DIAGNOSIS — D500 Iron deficiency anemia secondary to blood loss (chronic): Secondary | ICD-10-CM | POA: Diagnosis not present

## 2021-10-19 DIAGNOSIS — R531 Weakness: Secondary | ICD-10-CM | POA: Diagnosis not present

## 2021-10-19 DIAGNOSIS — R404 Transient alteration of awareness: Secondary | ICD-10-CM | POA: Diagnosis not present

## 2021-10-19 LAB — CBC WITH DIFFERENTIAL/PLATELET
Abs Immature Granulocytes: 0.04 10*3/uL (ref 0.00–0.07)
Basophils Absolute: 0 10*3/uL (ref 0.0–0.1)
Basophils Relative: 0 %
Eosinophils Absolute: 0 10*3/uL (ref 0.0–0.5)
Eosinophils Relative: 0 %
HCT: 31.4 % — ABNORMAL LOW (ref 39.0–52.0)
Hemoglobin: 10.4 g/dL — ABNORMAL LOW (ref 13.0–17.0)
Immature Granulocytes: 0 %
Lymphocytes Relative: 6 %
Lymphs Abs: 0.6 10*3/uL — ABNORMAL LOW (ref 0.7–4.0)
MCH: 30.6 pg (ref 26.0–34.0)
MCHC: 33.1 g/dL (ref 30.0–36.0)
MCV: 92.4 fL (ref 80.0–100.0)
Monocytes Absolute: 0.6 10*3/uL (ref 0.1–1.0)
Monocytes Relative: 6 %
Neutro Abs: 9.3 10*3/uL — ABNORMAL HIGH (ref 1.7–7.7)
Neutrophils Relative %: 88 %
Platelets: 167 10*3/uL (ref 150–400)
RBC: 3.4 MIL/uL — ABNORMAL LOW (ref 4.22–5.81)
RDW: 13.3 % (ref 11.5–15.5)
WBC: 10.6 10*3/uL — ABNORMAL HIGH (ref 4.0–10.5)
nRBC: 0 % (ref 0.0–0.2)

## 2021-10-19 LAB — COMPREHENSIVE METABOLIC PANEL
ALT: 16 U/L (ref 0–44)
AST: 18 U/L (ref 15–41)
Albumin: 3.8 g/dL (ref 3.5–5.0)
Alkaline Phosphatase: 46 U/L (ref 38–126)
Anion gap: 12 (ref 5–15)
BUN: 51 mg/dL — ABNORMAL HIGH (ref 8–23)
CO2: 20 mmol/L — ABNORMAL LOW (ref 22–32)
Calcium: 9.2 mg/dL (ref 8.9–10.3)
Chloride: 107 mmol/L (ref 98–111)
Creatinine, Ser: 2.87 mg/dL — ABNORMAL HIGH (ref 0.61–1.24)
GFR, Estimated: 21 mL/min — ABNORMAL LOW (ref >60)
Glucose, Bld: 145 mg/dL — ABNORMAL HIGH (ref 70–99)
Potassium: 4.5 mmol/L (ref 3.5–5.1)
Sodium: 139 mmol/L (ref 135–145)
Total Bilirubin: 0.7 mg/dL (ref 0.3–1.2)
Total Protein: 6.8 g/dL (ref 6.5–8.1)

## 2021-10-19 MED ORDER — LACTATED RINGERS IV BOLUS
1000.0000 mL | Freq: Once | INTRAVENOUS | Status: AC
Start: 2021-10-19 — End: 2021-10-19
  Administered 2021-10-19: 1000 mL via INTRAVENOUS

## 2021-10-19 MED ORDER — LACTATED RINGERS IV BOLUS
1000.0000 mL | Freq: Once | INTRAVENOUS | Status: AC
  Administered 2021-10-20: 1000 mL via INTRAVENOUS

## 2021-10-19 NOTE — ED Notes (Signed)
[phone] pt wife renee would like to speak to nurse  ?

## 2021-10-19 NOTE — ED Provider Notes (Signed)
?[facility] ?Provider Note ? ? ?CSN: [id number] ?Arrival date & time: 10/19/21  2054 ? ?  ? ?History ? ?Chief Complaint  ?Patient presents with  ? Weakness  ? ? ?Alan [name] is a 81 y.o. male. ? ?HPI ?81 year old male presents with syncope.  He has passed out 3 times in the last 2 days including twice today.  He normally has issues with orthostatic hypotension but typically once he stands up he will be okay.  However twice today he was walking and then passed out.  He has been feeling generally weak for months but it much more progressive and worse over the last couple days.  His p.o. intake has declined.  He is only urinated a couple times per day and is worried about dehydration. ? ?I spoke to EMS and the patient's vital signs were normal.  There was a paced rhythm on their ECG but no other abnormalities.  Patient denies head injury, chest pain, shortness of breath.  He has chronic diarrhea that is a little worse over the last few days.  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  He states this past week he was recently prescribed medical marijuana though he does not think it is helping. ? ?Home Medications ?Prior to Admission medications   ?Medication Sig Start Date End Date Taking? Authorizing Provider  ?albuterol (VENTOLIN HFA) 108 (90 Base) MCG/ACT inhaler Inhale 2 puffs into the lungs every 6 (six) hours as needed for wheezing or shortness of breath. 09/01/21   [name], MD  ?aspirin 81 MG EC tablet Take 81 mg by mouth daily. Swallow whole.    [provider]  ?dronabinol (MARINOL) 2.5 MG capsule Take 1 capsule (2.5 mg total) by mouth 2 (two) times daily before a meal. 10/13/21   Brunetta Genera, MD  ?Ensure Plus (ENSURE PLUS) LIQD Take 237 mLs by mouth 2 (two) times daily between meals.    [provider]  ?fludrocortisone (FLORINEF) 0.1 MG tablet Take 1 tablet (0.1 mg total) by mouth daily. 05/15/21   Jerline Pain, MD  ?iron polysaccharides (NIFEREX) 150 MG capsule TAKE 1 CAPSULE(150 MG) BY MOUTH 3 TIMES A WEEK 09/22/21   Brunetta Genera, MD  ?LANREOTIDE ACETATE [place] Inject 1 Dose into the skin every 30 (thirty) days.    [provider]  ?midodrine (PROAMATINE) 10 MG tablet Take 1 tablet (10 mg total) by mouth 3 (three) times daily. 04/21/21   Jerline Pain, MD  ?mirtazapine (REMERON) 7.5 MG tablet Take 1 tablet (7.5 mg total) by mouth daily. 08/19/21   [name], MD  ?Multiple Vitamin (MULTIVITAMIN WITH MINERALS) TABS Take 1 tablet by mouth daily.    [provider]  ?Multiple Vitamins-Minerals (PRESERVISION AREDS 2 PO) Take 1 capsule by mouth daily.    [provider]  ?multivitamin (ONE-A-DAY MEN'S) TABS tablet 1 tab    [provider]  ?potassium chloride SA (KLOR-CON M) 20 MEQ tablet Take 1 tablet twice daily. 09/19/21   Brunetta Genera, MD  ?Dellis Anes 160-4.5 MCG/ACT inhaler Inhale 2 puffs into the lungs daily as needed (wheezing). 08/18/21   [name], MD  ?Vitamin D, Ergocalciferol, (DRISDOL) 1.25 MG (50000 UNIT) CAPS capsule Take 50,000 Units by mouth every 7 (seven) days. 07/08/20   [provider]  ?   ? ?Allergies    ?Penicillins and Lisinopril   ? ?Review of Systems   ?Review of Systems  ?  Constitutional:  Negative for fever.  ?Respiratory:  Negative for shortness of breath.   ?Cardiovascular:  Negative for chest pain.  ?Gastrointestinal:  Positive for diarrhea. Negative for abdominal pain.  ?Genitourinary:  Positive for decreased urine volume. Negative for dysuria.  ?Neurological:  Positive for syncope, weakness and light-headedness. Negative for headaches.  ? ?Physical Exam ?Updated Vital Signs ?BP (!) 188/98   Pulse 70   Temp 97.7 ?F (36.5 ?C) (Oral)   Resp (!) 25   Ht '6\' 1"'$  (1.854 m)   Wt 61.9 kg   SpO2 100%   BMI 18.00 kg/m?  ?Physical Exam ?Vitals and nursing note reviewed.  ?Constitutional:   ?   Appearance: He is well-developed.  ?HENT:   ?   Head: Normocephalic and atraumatic.  ?Eyes:  ?   Extraocular Movements: Extraocular movements intact.  ?   Pupils: Pupils are equal, round, and reactive to light.  ?Cardiovascular:  ?   Rate and Rhythm: Normal rate and regular rhythm.  ?   Heart sounds: Normal heart sounds.  ?Pulmonary:  ?   Effort: Pulmonary effort is normal.  ?   Breath sounds: Normal breath sounds.  ?Abdominal:  ?   Palpations: Abdomen is soft.  ?   Tenderness: There is no abdominal tenderness.  ?Skin: ?   General: Skin is warm and dry.  ?Neurological:  ?   Mental Status: He is alert.  ?   Comments: CN 3-12 grossly intact. 5/5 strength in all 4 extremities. Grossly normal sensation. Normal finger to nose.   ? ? ?ED Results / Procedures / Treatments   ?Labs ?(all labs ordered are listed, but only abnormal results are displayed) ?Labs Reviewed  ?COMPREHENSIVE METABOLIC PANEL - Abnormal; Notable for the following components:  ?    Result Value  ? CO2 20 (*)   ? Glucose, Bld 145 (*)   ? BUN 51 (*)   ? Creatinine, Ser 2.87 (*)   ? GFR, Estimated 21 (*)   ? All other components within normal limits  ?CBC WITH DIFFERENTIAL/PLATELET - Abnormal; Notable for the following components:  ? WBC 10.6 (*)   ? RBC 3.40 (*)   ? Hemoglobin 10.4 (*)   ? HCT 31.4 (*)   ? Neutro Abs 9.3 (*)   ? Lymphs Abs 0.6 (*)   ? All other components within normal limits  ?URINALYSIS, ROUTINE W REFLEX MICROSCOPIC  ? ? ?EKG ?None ? ?Radiology ?DG Chest Port 1 View ? ?Result Date: 10/19/2021 ?CLINICAL DATA:  Syncope EXAM: PORTABLE CHEST 1 VIEW COMPARISON:  04/24/2021 FINDINGS: The heart size and mediastinal contours are within normal limits. Both lungs are clear. The visualized skeletal structures are unremarkable. There are multiple shadows over the chest related to external objects. IMPRESSION: No active disease. Electronically Signed   By: Ulyses Jarred M.D.   On: 10/19/2021 21:30   ? ?Procedures ?Procedures  ? ? ?Medications Ordered in ED ?Medications  ?lactated ringers  bolus 1,000 mL (has no administration in time range)  ?lactated ringers bolus 1,000 mL (1,000 mLs Intravenous New Bag/Given 10/19/21 2146)  ? ? ?ED Course/ Medical Decision Making/ A&P ?  ?                        ?Medical Decision Making ?Amount and/or Complexity of Data Reviewed ?Independent Historian: EMS ?External Data Reviewed: notes. ?Labs: ordered. ?Radiology: ordered and independent interpretation performed. ?ECG/medicine tests: ordered and independent interpretation performed. ? ?Risk ?Decision regarding hospitalization. ? ? ?  Patient presents with multiple episodes of syncope this probably from dehydration.  He has a worsening renal function according to chart review and labs today.  Creatinine was bumped up when he went to his oncology visit 6 days ago and now is even worse.  He will be given IV fluids.  I think a lot of this is from poor p.o. intake and his chronic diarrhea.  Doubt infection at this point.  Chest x-ray images reviewed by myself and there is no acute pneumonia or edema.  Otherwise, he will need admission.  Discussed with Dr. Myna Hidalgo.  ECG without acute ischemia. ? ? ? ? ? ? ? ?Final Clinical Impression(s) / ED Diagnoses ?Final diagnoses:  ?AKI (acute kidney injury) ([place])  ? ? ?Rx / DC Orders ?ED Discharge Orders   ? ? None  ? ?  ? ? ?  ?Sherwood Gambler, MD ?10/19/21 2328 ? ?

## 2021-10-19 NOTE — ED Triage Notes (Signed)
The pt has been having passing out spells  for the past 2-3 days nausea no vomiting ?

## 2021-10-20 ENCOUNTER — Encounter (HOSPITAL_COMMUNITY): Payer: Self-pay | Admitting: Family Medicine

## 2021-10-20 ENCOUNTER — Ambulatory Visit: Payer: Self-pay | Admitting: Licensed Clinical Social Worker

## 2021-10-20 DIAGNOSIS — F329 Major depressive disorder, single episode, unspecified: Secondary | ICD-10-CM

## 2021-10-20 DIAGNOSIS — R55 Syncope and collapse: Secondary | ICD-10-CM | POA: Diagnosis not present

## 2021-10-20 DIAGNOSIS — I951 Orthostatic hypotension: Secondary | ICD-10-CM | POA: Diagnosis not present

## 2021-10-20 DIAGNOSIS — Z7189 Other specified counseling: Secondary | ICD-10-CM

## 2021-10-20 DIAGNOSIS — I10 Essential (primary) hypertension: Secondary | ICD-10-CM

## 2021-10-20 DIAGNOSIS — N179 Acute kidney failure, unspecified: Secondary | ICD-10-CM | POA: Diagnosis not present

## 2021-10-20 DIAGNOSIS — N1832 Chronic kidney disease, stage 3b: Secondary | ICD-10-CM | POA: Diagnosis not present

## 2021-10-20 LAB — URINALYSIS, ROUTINE W REFLEX MICROSCOPIC
Bacteria, UA: NONE SEEN
Bilirubin Urine: NEGATIVE
Glucose, UA: NEGATIVE mg/dL
Ketones, ur: NEGATIVE mg/dL
Leukocytes,Ua: NEGATIVE
Nitrite: NEGATIVE
Protein, ur: 30 mg/dL — AB
Specific Gravity, Urine: 1.008 (ref 1.005–1.030)
pH: 5 (ref 5.0–8.0)

## 2021-10-20 LAB — BASIC METABOLIC PANEL
Anion gap: 9 (ref 5–15)
BUN: 45 mg/dL — ABNORMAL HIGH (ref 8–23)
CO2: 24 mmol/L (ref 22–32)
Calcium: 9.6 mg/dL (ref 8.9–10.3)
Chloride: 106 mmol/L (ref 98–111)
Creatinine, Ser: 2.31 mg/dL — ABNORMAL HIGH (ref 0.61–1.24)
GFR, Estimated: 28 mL/min — ABNORMAL LOW (ref >60)
Glucose, Bld: 111 mg/dL — ABNORMAL HIGH (ref 70–99)
Potassium: 4.6 mmol/L (ref 3.5–5.1)
Sodium: 139 mmol/L (ref 135–145)

## 2021-10-20 LAB — CBC
HCT: 33.8 % — ABNORMAL LOW (ref 39.0–52.0)
Hemoglobin: 11.6 g/dL — ABNORMAL LOW (ref 13.0–17.0)
MCH: 30.8 pg (ref 26.0–34.0)
MCHC: 34.3 g/dL (ref 30.0–36.0)
MCV: 89.7 fL (ref 80.0–100.0)
Platelets: 159 10*3/uL (ref 150–400)
RBC: 3.77 MIL/uL — ABNORMAL LOW (ref 4.22–5.81)
RDW: 13 % (ref 11.5–15.5)
WBC: 10.1 10*3/uL (ref 4.0–10.5)
nRBC: 0 % (ref 0.0–0.2)

## 2021-10-20 MED ORDER — SODIUM CHLORIDE 0.9% FLUSH
3.0000 mL | Freq: Two times a day (BID) | INTRAVENOUS | Status: DC
  Administered 2021-10-20 – 2021-10-24 (×8): 3 mL via INTRAVENOUS

## 2021-10-20 MED ORDER — SODIUM CHLORIDE 0.9 % IV SOLN: INTRAVENOUS | Status: AC

## 2021-10-20 MED ORDER — ASPIRIN EC 81 MG PO TBEC
81.0000 mg | DELAYED_RELEASE_TABLET | Freq: Every day | ORAL | Status: DC
  Administered 2021-10-20 – 2021-10-24 (×5): 81 mg via ORAL
  Filled 2021-10-20 (×5): qty 1

## 2021-10-20 MED ORDER — ALBUTEROL SULFATE (2.5 MG/3ML) 0.083% IN NEBU
3.0000 mL | INHALATION_SOLUTION | Freq: Four times a day (QID) | RESPIRATORY_TRACT | Status: DC | PRN
  Administered 2021-10-20: 3 mL via RESPIRATORY_TRACT
  Filled 2021-10-20: qty 3

## 2021-10-20 MED ORDER — HEPARIN SODIUM (PORCINE) 5000 UNIT/ML IJ SOLN
5000.0000 [IU] | Freq: Three times a day (TID) | INTRAMUSCULAR | Status: DC
  Administered 2021-10-20 – 2021-10-24 (×14): 5000 [IU] via SUBCUTANEOUS
  Filled 2021-10-20 (×14): qty 1

## 2021-10-20 MED ORDER — MIRTAZAPINE 15 MG PO TABS
7.5000 mg | ORAL_TABLET | Freq: Every day | ORAL | Status: DC
Start: 2021-10-20 — End: 2021-10-22
  Administered 2021-10-20 – 2021-10-21 (×3): 7.5 mg via ORAL
  Filled 2021-10-20 (×3): qty 1

## 2021-10-20 MED ORDER — MIDODRINE HCL 5 MG PO TABS
10.0000 mg | ORAL_TABLET | Freq: Three times a day (TID) | ORAL | Status: DC
  Administered 2021-10-20 – 2021-10-22 (×8): 10 mg via ORAL
  Filled 2021-10-20 (×8): qty 2

## 2021-10-20 MED ORDER — ONDANSETRON HCL 4 MG/2ML IJ SOLN: 4.0000 mg | Freq: Four times a day (QID) | INTRAMUSCULAR | Status: DC | PRN

## 2021-10-20 MED ORDER — ACETAMINOPHEN 325 MG PO TABS
650.0000 mg | ORAL_TABLET | Freq: Four times a day (QID) | ORAL | Status: DC | PRN
  Filled 2021-10-20: qty 2

## 2021-10-20 MED ORDER — ONDANSETRON HCL 4 MG PO TABS: 4.0000 mg | ORAL_TABLET | Freq: Four times a day (QID) | ORAL | Status: DC | PRN

## 2021-10-20 MED ORDER — ACETAMINOPHEN 650 MG RE SUPP: 650.0000 mg | Freq: Four times a day (QID) | RECTAL | Status: DC | PRN

## 2021-10-20 MED ORDER — FLUDROCORTISONE ACETATE 0.1 MG PO TABS
0.1000 mg | ORAL_TABLET | Freq: Every day | ORAL | Status: DC
Start: 2021-10-20 — End: 2021-10-24
  Administered 2021-10-20 – 2021-10-24 (×5): 0.1 mg via ORAL
  Filled 2021-10-20 (×6): qty 1

## 2021-10-20 NOTE — Evaluation (Addendum)
Physical Therapy Evaluation ?Patient Details ?Name: Alan [name] ?MRN: [id number] ?DOB: April 10, 1941 ?Today's Date: 10/20/2021 ? ?History of Present Illness ? Pt is a 81 y.o. M who presents with orthostatic hypotension and recurrent syncopal episodes 10/19/2021. Significant PMH: malignant carcinoid tumor, short-bowel syndrome, chronic diarrhea, CKD stage IIIb, depression and anxiety, severe orthostatic hypotension  ?Clinical Impression ? PTA, pt lives with his spouse, uses cane and has recently been having difficulty completing ADL's. Pt with history of recurrent falls. Pt presents with generalized weakness, impaired balance and decreased activity tolerance. Pt requiring min assist for functional mobility. Ambulating 5 ft with no assistive device; demonstrates posterior bias and fatigues easily. Pt positive for orthostatic hypotension (see below). Presents as a high fall risk based on decreased gait speed and history of falls. Recommend SNF at discharge.  ? ?Vitals: ?Supine: 164/111 ?Sitting: 145/106 ?Standing: 111/65  ?   ? ?Recommendations for follow up therapy are one component of a multi-disciplinary discharge planning process, led by the attending physician.  Recommendations may be updated based on patient status, additional functional criteria and insurance authorization. ? ?Follow Up Recommendations Skilled nursing-short term rehab (<3 hours/day) ? ?  ?Assistance Recommended at Discharge Frequent or constant Supervision/Assistance  ?Patient can return home with the following ? A little help with walking and/or transfers;A little help with bathing/dressing/bathroom;Assistance with cooking/housework;Assist for transportation;Help with stairs or ramp for entrance ? ?  ?Equipment Recommendations None recommended by PT  ?Recommendations for Other Services ?    ?  ?Functional Status Assessment Patient has had a recent decline in their functional status and demonstrates the ability to make significant improvements in  function in a reasonable and predictable amount of time.  ? ?  ?Precautions / Restrictions Precautions ?Precautions: Fall;Other (comment) ?Precaution Comments: watch BP ?Restrictions ?Weight Bearing Restrictions: No  ? ?  ? ?Mobility ? Bed Mobility ?Overal bed mobility: Needs Assistance ?Bed Mobility: Supine to Sit, Sit to Supine ?  ?  ?Supine to sit: Min guard ?Sit to supine: Min assist ?  ?General bed mobility comments: MinA for LE negotiation back into stretcher ?  ? ?Transfers ?Overall transfer level: Needs assistance ?Equipment used: None ?Transfers: Sit to/from Stand ?Sit to Stand: Min guard ?  ?  ?  ?  ?  ?General transfer comment: Assist to rise and steady ?  ? ?Ambulation/Gait ?Ambulation/Gait assistance: Min assist ?Gait Distance (Feet): 5 Feet ?Assistive device: None ?Gait Pattern/deviations: Step-through pattern, Decreased stride length ?Gait velocity: decreased ?Gait velocity interpretation: <1.31 ft/sec, indicative of household ambulator ?  ?General Gait Details: MinA for balance, posterior bias, crouched posture. ? ?Stairs ?  ?  ?  ?  ?  ? ?Wheelchair Mobility ?  ? ?Modified Rankin (Stroke Patients Only) ?  ? ?  ? ?Balance Overall balance assessment: Needs assistance ?Sitting-balance support: Feet supported ?Sitting balance-Leahy Scale: Fair ?  ?  ?Standing balance support: No upper extremity supported, During functional activity ?Standing balance-Leahy Scale: Poor ?Standing balance comment: reaching out for external support, minA for balance ?  ?  ?  ?  ?  ?  ?  ?  ?  ?  ?  ?   ? ? ? ?Pertinent Vitals/Pain Pain Assessment ?Pain Assessment: Faces ?Faces Pain Scale: Hurts little more ?Pain Location: chronic neck and back pain ?Pain Descriptors / Indicators: Discomfort, Grimacing ?Pain Intervention(s): Monitored during session  ? ? ?Home Living Family/patient expects to be discharged to:: Private residence ?Living Arrangements: Spouse/significant other ?Available Help at Discharge: Family ?Type of  Home:  House ?Home Access: Stairs to enter ?Entrance Stairs-Rails: Can reach both ?Entrance Stairs-Number of Steps: 5 ?  ?Home Layout: One level ?Home Equipment: Wheelchair - Publishing copy (2 wheels);Cane - single point;Shower seat ?   ?  ?Prior Function Prior Level of Function : Needs assist ?  ?  ?  ?  ?  ?  ?Mobility Comments: uses cane intermittently for balance ?ADLs Comments: sits to shower with wife supervising, takes cool shower to prevent vasovagal episodes ?  ? ? ?Hand Dominance  ? Dominant Hand: Right ? ?  ?Extremity/Trunk Assessment  ? Upper Extremity Assessment ?Upper Extremity Assessment: Generalized weakness ?  ? ?Lower Extremity Assessment ?Lower Extremity Assessment: Generalized weakness ?  ? ?Cervical / Trunk Assessment ?Cervical / Trunk Assessment: Kyphotic  ?Communication  ? Communication: No difficulties  ?Cognition Arousal/Alertness: Awake/alert ?Behavior During Therapy: [facility] for tasks assessed/performed ?Overall Cognitive Status: Within Functional Limits for tasks assessed ?  ?  ?  ?  ?  ?  ?  ?  ?  ?  ?  ?  ?  ?  ?  ?  ?  ?  ?  ? ?  ?General Comments   ? ?  ?Exercises    ? ?Assessment/Plan  ?  ?PT Assessment Patient needs continued PT services  ?PT Problem List Decreased strength;Decreased activity tolerance;Decreased balance;Decreased mobility ? ?   ?  ?PT Treatment Interventions DME instruction;Gait training;Functional mobility training;Therapeutic activities;Therapeutic exercise;Balance training;Patient/family education   ? ?PT Goals (Current goals can be found in the Care Plan section)  ?Acute Rehab PT Goals ?Patient Stated Goal: get stronger ?PT Goal Formulation: With patient/family ?Time For Goal Achievement: 11/03/21 ?Potential to Achieve Goals: Good ? ?  ?Frequency Min 3X/week ?  ? ? ?Co-evaluation   ?  ?  ?  ?  ? ? ?  ?AM-PAC PT "6 Clicks" Mobility  ?Outcome Measure Help needed turning from your back to your side while in a flat bed without using bedrails?: A Little ?Help needed  moving from lying on your back to sitting on the side of a flat bed without using bedrails?: A Little ?Help needed moving to and from a bed to a chair (including a wheelchair)?: A Little ?Help needed standing up from a chair using your arms (e.g., wheelchair or bedside chair)?: A Little ?Help needed to walk in hospital room?: A Lot ?Help needed climbing 3-5 steps with a railing? : Total ?6 Click Score: 15 ? ?  ?End of Session Equipment Utilized During Treatment: Gait belt ?Activity Tolerance: Patient tolerated treatment well ?Patient left: in bed;with call bell/phone within reach ?Nurse Communication: Mobility status ?PT Visit Diagnosis: Unsteadiness on feet (R26.81);Other abnormalities of gait and mobility (R26.89);Repeated falls (R29.6);Muscle weakness (generalized) (M62.81);Difficulty in walking, not elsewhere classified (R26.2) ?  ? ?Time: 3810-1751 ?PT Time Calculation (min) (ACUTE ONLY): 34 min ? ? ?Charges:   PT Evaluation ?$PT Eval Moderate Complexity: 1 Mod ?PT Treatments ?$Therapeutic Activity: 8-22 mins ?  ?   ? ? ?Wyona Almas, PT, DPT ?Acute Rehabilitation Services ?Pager [phone] ?Office [phone] ? ? ?Carloine Margo Aye ?10/20/2021, 4:30 PM ? ?

## 2021-10-20 NOTE — ED Notes (Signed)
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 ?

## 2021-10-20 NOTE — ED Notes (Signed)
Dr Sloan Leiter is aware of pt's elevated diastolic. ?

## 2021-10-20 NOTE — Chronic Care Management (AMB) (Signed)
? ? ?  Care Management  ? Note ? ?10/20/2021 ?Name: Lovett Coffin Koval MRN: [id number] DOB: 03/15/1941 ? ?Summary: Patient's Wife provided all information during this encounter..  Incoming call from patient's wife to report he is in the hospital. She is unsure of what to do.  ?Explained they will have a hospital transitions of care team to work with them for discharge planning.  ?Provided emotional support and discussed ongoing options ? ?Follow up Plan:  ?Wife will call office or CCM team as needed ?CCM RN has follow up appointment scheduled in May ?No follow up scheduled with LCSW at this time ? ? ?Casimer Lanius, LCSW ?Licensed Clinical Social Worker [facility]  ?[place]  ?[phone]  ?  ? ? ?  ? ? ?

## 2021-10-20 NOTE — Progress Notes (Signed)
?PROGRESS NOTE ? ? ? ?Alan [name]  ZOX:[id number] DOB: 1940-10-01 DOA: 10/19/2021 ?PCP: [name], MD  ? ? ?Brief Narrative:  ?81 year old gentleman with history of malignant carcinoid tumor, short-bowel syndrome, chronic diarrhea, CKD stage IIIb, depression and anxiety, severe orthostatic hypotension presented to the emergency room with lightheadedness on standing and recurrent syncopal episodes.  Has a long history of orthostatic hypotension and currently on fludrocortisone and midodrine.  Were symptoms for last 3 days with multiple syncopal episode.  In the ER hemodynamically stable.  Creatinine 2.87 slightly worse from baseline.  Blood pressure dropped from 811-91 systolic.  Given IV fluids and admitted with significant symptoms. ? ? ?Assessment & Plan: ?  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 ?Continue fludrocortisone and midodrine ?Start compression stockings, continue IV fluids, extreme orthostatic precautions and mobilize with PT OT, all-time fall precautions.  Allow supine hypertension to have enough room for orthostatic hypotension.  If it not corrected with IV fluid and compression stockings, will increase dose of midodrine. ? ?Acute kidney injury superimposed on CKD stage IIIb: ?Presentation creatinine 2.87, recent creatinine 2.09.  Likely prerenal in the setting of poor appetite and chronic diarrhea with weight loss.  Continue IV fluid hydration today. ? 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 ? ?Paroxysmal A-fib: Currently sinus rhythm.  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  On aspirin for anticoagulation.  Heart rate is controlled. ? ?Depression and anxiety: On Remeron. ? ? ?DVT prophylaxis: heparin injection 5,000 Units Start: 10/20/21 0600 ? ? ?Code Status: Full code. ?Family Communication: Wife on the phone. ?Disposition Plan:  Status is: Inpatient ?Remains inpatient appropriate because: Significant orthostatic hypotension.  Unsafe to discharge without improving mobility. ?  ? ? ?Consultants:  ?None ? ?Procedures:  ?None ? ?Antimicrobials:  ?None ? ? ?Subjective: ?Patient seen and examined.  When he is supine and resting, he feels okay.  Orthostatics not checked yet in the ER.  He is a scared of getting up and walking. ?He does not have any more diarrhea since last 3 days.  Last dose of octreotide was 7 days ago. ? ?Objective: ?Vitals:  ? 10/20/21 1000 10/20/21 1100 10/20/21 1145 10/20/21 1200  ?BP: (!) 178/104 (!) 179/104  (!) 164/112  ?Pulse: 76 74 71 79  ?Resp: 17 (!) 23 16 (!) 25  ?Temp:      ?TempSrc:      ?SpO2: 100% 100% 100% 99%  ?Weight:      ?Height:      ? ? ?Intake/Output Summary (Last 24 hours) at 10/20/2021 1242 ?Last data filed at 10/20/2021 0556 ?Gross per 24 hour  ?Intake 1000 ml  ?Output --  ?Net 1000 ml  ? ?Filed Weights  ? 10/19/21 2100  ?Weight: 61.9 kg  ? ? ?Examination: ? ?General exam: Appears calm and comfortable  ?Frail and debilitated. ?Respiratory system: Clear to auscultation. Respiratory effort normal. ?Cardiovascular system: S1 & S2 heard, RRR.  Left precordial pacemaker in place.  No pedal edema. ?Gastrointestinal system: Abdomen is nondistended, soft and nontender. No organomegaly or masses felt. Normal bowel sounds heard. ?Central nervous system: Alert and oriented. No focal neurological deficits. ?Extremities: Symmetric 5 x 5 power. ?Skin: No rashes, lesions or ulcers ?Psychiatry: Judgement and insight appear normal. Mood & affect appropriate.  ? ? ? ?Data Reviewed: I have personally reviewed following labs and imaging studies ? ?CBC: ?Recent Labs  ?Lab 10/19/21 ?  2138 10/20/21 ?6579  ?WBC 10.6* 10.1  ?NEUTROABS 9.3*  --   ?HGB 10.4* 11.6*  ?HCT 31.4* 33.8*  ?MCV 92.4 89.7  ?PLT 167 159  ? ?Basic Metabolic Panel: ?Recent Labs  ?Lab 10/19/21 ?2138 10/20/21 ?0383  ?NA 139 139  ?K 4.5 4.6  ?CL 107 106  ?CO2 20*  24  ?GLUCOSE 145* 111*  ?BUN 51* 45*  ?CREATININE 2.87* 2.31*  ?CALCIUM 9.2 9.6  ? ?GFR: ?Estimated Creatinine Clearance: 22.3 mL/min (A) (by C-G formula based on SCr of 2.31 mg/dL (H)). ?Liver Function Tests: ?Recent Labs  ?Lab 10/19/21 ?2138  ?AST 18  ?ALT 16  ?ALKPHOS 46  ?BILITOT 0.7  ?PROT 6.8  ?ALBUMIN 3.8  ? ?No results for input(s): LIPASE, AMYLASE in the last 168 hours. ?No results for input(s): AMMONIA in the last 168 hours. ?Coagulation Profile: ?No results for input(s): INR, PROTIME in the last 168 hours. ?Cardiac Enzymes: ?No results for input(s): CKTOTAL, CKMB, CKMBINDEX, TROPONINI in the last 168 hours. ?BNP (last 3 results) ?No results for input(s): PROBNP in the last 8760 hours. ?HbA1C: ?No results for input(s): HGBA1C in the last 72 hours. ?CBG: ?No results for input(s): GLUCAP in the last 168 hours. ?Lipid Profile: ?No results for input(s): CHOL, HDL, LDLCALC, TRIG, CHOLHDL, LDLDIRECT in the last 72 hours. ?Thyroid Function Tests: ?No results for input(s): TSH, T4TOTAL, FREET4, T3FREE, THYROIDAB in the last 72 hours. ?Anemia Panel: ?No results for input(s): VITAMINB12, FOLATE, FERRITIN, TIBC, IRON, RETICCTPCT in the last 72 hours. ?Sepsis Labs: ?No results for input(s): PROCALCITON, LATICACIDVEN in the last 168 hours. ? ?No results found for this or any previous visit (from the past 240 hour(s)).  ? ? ? ? ? ?Radiology Studies: ?DG Chest Port 1 View ? ?Result Date: 10/19/2021 ?CLINICAL DATA:  Syncope EXAM: PORTABLE CHEST 1 VIEW COMPARISON:  04/24/2021 FINDINGS: The heart size and mediastinal contours are within normal limits. Both lungs are clear. The visualized skeletal structures are unremarkable. There are multiple shadows over the chest related to external objects. IMPRESSION: No active disease. Electronically Signed   By: Ulyses Jarred M.D.   On: 10/19/2021 21:30   ? ? ? ? ? ?Scheduled Meds: ? aspirin EC  81 mg Oral Daily  ? fludrocortisone  0.1 mg Oral Daily  ? heparin  5,000 Units  Subcutaneous Q8H  ? midodrine  10 mg Oral TID with meals  ? mirtazapine  7.5 mg Oral Daily  ? sodium chloride flush  3 mL Intravenous Q12H  ? ?Continuous Infusions: ? sodium chloride 100 mL/hr at 10/20/21 0556  ? ? ? LOS: 1 day  ? ? ?Time spent: 35 minutes ? ? ? ?Barb Merino, MD ?[facility] ?Pager [phone]  ?

## 2021-10-20 NOTE — ED Notes (Signed)
Patients Pacemaker is [employer]. I called and asked for them to interrogate the pacemaker. ?

## 2021-10-20 NOTE — ED Notes (Signed)
Breakfast order placed ?

## 2021-10-20 NOTE — H&P (Signed)
?History and Physical  ? ? ?Alan [name] KTG:[id number] DOB: 02-09-1941 DOA: 10/19/2021 ? ?PCP: [name], MD  ? ?Patient coming from: Home  ? ?Chief Complaint: Syncope ? ?HPI: Alan [name] is a very pleasant 81 y.o. 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  The patient reports a long history of orthostatic hypotension for which she takes fludrocortisone and midodrine, but has had significant worsening in his symptoms in the past 3 days with multiple syncopal episodes.  Patient reports chronic diarrhea that had been worse a few days ago, poor appetite, and difficulty consuming as much water as he has been instructed to.  He denies any chest pain or palpitations but reports that he becomes lightheaded on standing.  All of his recent syncopal episodes have occurred after standing up, associated with lightheadedness, and without chest pain or palpitations. ? ?ED Course: Upon arrival to the ED, patient is found to be afebrile and saturating well on room air with elevated blood pressure while lying down.  EKG features sinus rhythm with nonspecific T wave abnormality.  Chest x-ray negative for acute cardiopulmonary disease.  Chemistry panel notable for creatinine 2.87.  CBC with slight leukocytosis and normocytic anemia.  Systolic blood pressure dropped 70 mmHg on standing in the ED.  He was given 2 L of LR. ? ?Review of Systems:  ?All other systems reviewed and apart from HPI, are negative. ? ?Past Medical History:  ?Diagnosis Date  ? Asthma   ? Carcinoid tumor, small intestine, malignant ([facility])   ? Chronic diarrhea   ? CKD (chronic kidney disease) stage 3, GFR 30-59 ml/min (HCC)   ? HTN (hypertension)   ? Short gut syndrome   ? ? ?Past Surgical History:   ?Procedure Laterality Date  ? COLON SURGERY    ? 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  ? Procedure: PACEMAKER IMPLANT;  Surgeon: Constance Haw, MD;  Location: [facility];  Service: Cardiovascular;  Laterality: N/A;  ? ? ?Social History:  ? reports that he quit smoking about 19 months ago. His smoking use included cigarettes. He started smoking about 63 years ago. He has a 18.30 pack-year smoking history. He has never used smokeless tobacco. He reports that he does not currently use alcohol. He reports that he does not use drugs. ? ?Allergies  ?Allergen Reactions  ? Penicillins Hives and Shortness Of Breath  ? Lisinopril Itching  ? ? ?Family History  ?Problem Relation Age of Onset  ? Alzheimer's disease Mother   ? ? ? ?Prior to Admission medications   ?Medication Sig Start Date End Date Taking? Authorizing Provider  ?albuterol (VENTOLIN HFA) 108 (90 Base) MCG/ACT inhaler Inhale 2 puffs into the lungs every 6 (six) hours as needed for wheezing or shortness of breath. 09/01/21  Yes [name], MD  ?aspirin 81 MG EC tablet Take 81 mg by mouth daily. Swallow whole.   Yes [provider]  ?dronabinol (MARINOL) 2.5 MG capsule Take 1 capsule (2.5 mg total) by mouth 2 (two) times daily before a meal. 10/13/21  Yes Brunetta Genera, MD  ?Ensure Plus (ENSURE PLUS) LIQD Take 237 mLs by mouth 2 (two) times daily between meals.   Yes [provider]  ?fludrocortisone (FLORINEF) 0.1 MG tablet Take 1 tablet (0.1 mg total) by mouth daily.  05/15/21  Yes Jerline Pain, MD  ?midodrine (PROAMATINE) 10 MG tablet Take 1 tablet (10 mg total) by mouth 3 (three) times daily. 04/21/21  Yes Jerline Pain, MD  ?mirtazapine (REMERON) 7.5 MG tablet Take 1 tablet (7.5 mg total) by mouth daily. 08/19/21  Yes [name], MD  ?iron polysaccharides (NIFEREX) 150 MG capsule TAKE 1 CAPSULE(150 MG) BY MOUTH 3 TIMES A WEEK 09/22/21   Brunetta Genera, MD  ?LANREOTIDE ACETATE [place] Inject 1 Dose into the skin every  30 (thirty) days.    [provider]  ?Multiple Vitamin (MULTIVITAMIN WITH MINERALS) TABS Take 1 tablet by mouth daily.    [provider]  ?Multiple Vitamins-Minerals (PRESERVISION AREDS 2 PO) Take 1 capsule by mouth daily.    [provider]  ?multivitamin (ONE-A-DAY MEN'S) TABS tablet 1 tab    [provider]  ?potassium chloride SA (KLOR-CON M) 20 MEQ tablet Take 1 tablet twice daily. 09/19/21   Brunetta Genera, MD  ?Dellis Anes 160-4.5 MCG/ACT inhaler Inhale 2 puffs into the lungs daily as needed (wheezing). 08/18/21   [name], MD  ?Vitamin D, Ergocalciferol, (DRISDOL) 1.25 MG (50000 UNIT) CAPS capsule Take 50,000 Units by mouth every 7 (seven) days. 07/08/20   [provider]  ? ? ?Physical Exam: ?Vitals:  ? 10/19/21 2130 10/19/21 2145 10/19/21 2150 10/19/21 2222  ?BP: (!) 160/86 (!) 207/104 (!) 188/98   ?Pulse: 65 73 70   ?Resp: (!) 25 (!) 27 (!) 25   ?Temp:    97.7 ?F (36.5 ?C)  ?TempSrc:    Oral  ?SpO2: 100% 99% 100%   ?Weight:      ?Height:      ? ? ?Constitutional: NAD, calm. Cachectic   ?Eyes: PERTLA, lids and conjunctivae normal ?ENMT: Mucous membranes are moist. Posterior pharynx clear of any exudate or lesions.   ?Neck: supple, no masses  ?Respiratory: no wheezing, no crackles. No accessory muscle use.  ?Cardiovascular: S1 & S2 heard, regular rate and rhythm. No extremity edema. No significant JVD. ?Abdomen: No distension, no tenderness, soft. Bowel sounds active.  ?Musculoskeletal: no clubbing / cyanosis. No joint deformity upper and lower extremities.   ?Skin: no significant rashes, lesions, ulcers. Warm, dry, well-perfused. Poor turgor.  ?Neurologic: CN 2-12 grossly intact. Moving all extremities. Alert and oriented.  ?Psychiatric: Very pleasant. Cooperative.  ? ? ?Labs and Imaging on Admission: I have personally reviewed following labs and imaging studies ? ?CBC: ?Recent Labs  ?Lab 10/13/21 ?0937 10/19/21 ?2138  ?WBC 7.9 10.6*  ?NEUTROABS 4.8  9.3*  ?HGB 10.9* 10.4*  ?HCT 31.3* 31.4*  ?MCV 88.7 92.4  ?PLT 177 167  ? ?Basic Metabolic Panel: ?Recent Labs  ?Lab 10/13/21 ?0937 10/19/21 ?2138  ?NA 141 139  ?K 4.5 4.5  ?CL 109 107  ?CO2 25 20*  ?GLUCOSE 110* 145*  ?BUN 47* 51*  ?CREATININE 2.45* 2.87*  ?CALCIUM 9.6 9.2  ? ?GFR: ?Estimated Creatinine Clearance: 18 mL/min (A) (by C-G formula based on SCr of 2.87 mg/dL (H)). ?Liver Function Tests: ?Recent Labs  ?Lab 10/13/21 ?0937 10/19/21 ?2138  ?AST 19 18  ?ALT 19 16  ?ALKPHOS 52 46  ?BILITOT 0.7 0.7  ?PROT 7.3 6.8  ?ALBUMIN 4.2 3.8  ? ?No results for input(s): LIPASE, AMYLASE in the last 168 hours. ?No results for input(s): AMMONIA in the last 168 hours. ?Coagulation Profile: ?No results for input(s): INR, PROTIME in the last 168 hours. ?Cardiac Enzymes: ?No results for input(s): CKTOTAL, CKMB,  CKMBINDEX, TROPONINI in the last 168 hours. ?BNP (last 3 results) ?No results for input(s): PROBNP in the last 8760 hours. ?HbA1C: ?No results for input(s): HGBA1C in the last 72 hours. ?CBG: ?No results for input(s): GLUCAP in the last 168 hours. ?Lipid Profile: ?No results for input(s): CHOL, HDL, LDLCALC, TRIG, CHOLHDL, LDLDIRECT in the last 72 hours. ?Thyroid Function Tests: ?No results for input(s): TSH, T4TOTAL, FREET4, T3FREE, THYROIDAB in the last 72 hours. ?Anemia Panel: ?No results for input(s): VITAMINB12, FOLATE, FERRITIN, TIBC, IRON, RETICCTPCT in the last 72 hours. ?Urine analysis: ?   ?Component Value Date/Time  ? COLORURINE YELLOW 01/16/2021 1330  ? APPEARANCEUR CLEAR 01/16/2021 1330  ? LABSPEC 1.024 01/16/2021 1330  ? PHURINE 5.0 01/16/2021 1330  ? GLUCOSEU NEGATIVE 01/16/2021 1330  ? HGBUR NEGATIVE 01/16/2021 1330  ? BILIRUBINUR NEGATIVE 01/16/2021 1330  ? KETONESUR NEGATIVE 01/16/2021 1330  ? PROTEINUR NEGATIVE 01/16/2021 1330  ? UROBILINOGEN 0.2 08/23/2012 2225  ? NITRITE NEGATIVE 01/16/2021 1330  ? LEUKOCYTESUR NEGATIVE 01/16/2021 1330  ? ?Sepsis Labs: ?'@LABRCNTIP'$ (procalcitonin:4,lacticidven:4) ?)No  results found for this or any previous visit (from the past 240 hour(s)).  ? ?Radiological Exams on Admission: ?DG Chest Port 1 View ? ?Result Date: 10/19/2021 ?CLINICAL DATA:  Syncope EXAM: PORTABLE CHEST

## 2021-10-20 NOTE — Patient Instructions (Signed)
? ?  Keep all appointments ?CCM RN will follow up with you in May   ?Call office if needed ?Reach out to CCM as needed for care coordination  ? ?Casimer Lanius, LCSW ?Licensed Clinical Social Worker [facility]  ?[place]  ?[phone]  ?

## 2021-10-21 DIAGNOSIS — I951 Orthostatic hypotension: Secondary | ICD-10-CM | POA: Diagnosis not present

## 2021-10-21 DIAGNOSIS — N179 Acute kidney failure, unspecified: Secondary | ICD-10-CM | POA: Diagnosis not present

## 2021-10-21 DIAGNOSIS — R55 Syncope and collapse: Secondary | ICD-10-CM | POA: Diagnosis not present

## 2021-10-21 DIAGNOSIS — N1832 Chronic kidney disease, stage 3b: Secondary | ICD-10-CM | POA: Diagnosis not present

## 2021-10-21 LAB — CBC
HCT: 30.2 % — ABNORMAL LOW (ref 39.0–52.0)
Hemoglobin: 10.6 g/dL — ABNORMAL LOW (ref 13.0–17.0)
MCH: 30.9 pg (ref 26.0–34.0)
MCHC: 35.1 g/dL (ref 30.0–36.0)
MCV: 88 fL (ref 80.0–100.0)
Platelets: 154 10*3/uL (ref 150–400)
RBC: 3.43 MIL/uL — ABNORMAL LOW (ref 4.22–5.81)
RDW: 12.8 % (ref 11.5–15.5)
WBC: 7.5 10*3/uL (ref 4.0–10.5)
nRBC: 0 % (ref 0.0–0.2)

## 2021-10-21 LAB — BASIC METABOLIC PANEL
Anion gap: 9 (ref 5–15)
BUN: 38 mg/dL — ABNORMAL HIGH (ref 8–23)
CO2: 24 mmol/L (ref 22–32)
Calcium: 8.8 mg/dL — ABNORMAL LOW (ref 8.9–10.3)
Chloride: 106 mmol/L (ref 98–111)
Creatinine, Ser: 2.05 mg/dL — ABNORMAL HIGH (ref 0.61–1.24)
GFR, Estimated: 32 mL/min — ABNORMAL LOW (ref >60)
Glucose, Bld: 111 mg/dL — ABNORMAL HIGH (ref 70–99)
Potassium: 3.4 mmol/L — ABNORMAL LOW (ref 3.5–5.1)
Sodium: 139 mmol/L (ref 135–145)

## 2021-10-21 MED ORDER — POTASSIUM CHLORIDE CRYS ER 20 MEQ PO TBCR
40.0000 meq | EXTENDED_RELEASE_TABLET | Freq: Once | ORAL | Status: AC
Start: 2021-10-21 — End: 2021-10-21
  Administered 2021-10-21: 40 meq via ORAL
  Filled 2021-10-21: qty 2

## 2021-10-21 NOTE — Progress Notes (Signed)
?PROGRESS NOTE ? ? ? ?Alan [name]  HTD:[id number] DOB: 27-Mar-1941 DOA: 10/19/2021 ?PCP: [name], MD  ? ? ?Brief Narrative:  ?81 year old gentleman with history of malignant carcinoid tumor, short-bowel syndrome, chronic diarrhea, CKD stage IIIb, depression and anxiety, severe orthostatic hypotension presented to the emergency room with lightheadedness on standing and recurrent syncopal episodes.  Has a long history of orthostatic hypotension and currently on fludrocortisone and midodrine.  Were symptoms for last 3 days with multiple syncopal episode.  In the ER hemodynamically stable.  Creatinine 2.87 slightly worse from baseline.  Blood pressure dropped from 726-20 systolic.  Given IV fluids and admitted with significant symptoms. ? ? ?Assessment & Plan: ?  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 ?Continue fludrocortisone and midodrine, treated with IV fluids with improvement of orthostatic symptoms today. ?Compression stockings.  Orthostatic precautions.  Accept supine hypertension to make enough room for drop in blood pressure.  Clinically improving. ? ?Acute kidney injury superimposed on CKD stage IIIb: ?Presentation creatinine 2.87, recent creatinine 2.09.  Likely prerenal in the setting of poor appetite and chronic diarrhea with weight loss.  Clinically improving so encourage oral intake.  Discontinue IV fluids. ? 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 ? ?Paroxysmal A-fib: Currently sinus rhythm.  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  On aspirin for anticoagulation.  Heart rate is controlled. ? ?Depression and anxiety: On Remeron. ? ? ?DVT prophylaxis: heparin injection 5,000 Units Start: 10/20/21 0600 ? ? ?Code Status: Full code. ?Family Communication: Wife at the bedside. ?Disposition Plan: Status is: Inpatient ?Remains inpatient  appropriate because: Needs a skilled rehab. ? ?Consultants:  ?None ? ?Procedures:  ?None ? ?Antimicrobials:  ?None ? ? ?Subjective: ? ?Patient seen and examined.  Denies any complaints at rest.  Wife at the bedside. ?Patient asking me to discharge him home, wife is asking him to go to skilled nursing rehab.  Patient denies any problem, he wants to take the chance. ?Patient is unsafe to mobilize without support and has a high risk of fall, encouraged him to consider going to rehab.  Patient had orthostatic drop of about 50 on systolic blood pressure, did not feel dizzy. ? ?Objective: ?Vitals:  ? 10/20/21 1736 10/20/21 2123 10/21/21 0051 10/21/21 0513  ?BP: 94/74 (!) 149/101 (!) 147/100 (!) 143/100  ?Pulse: 80 66 64 64  ?Resp: '16 18 20 16  '$ ?Temp:  98.3 ?F (36.8 ?C) 98.5 ?F (36.9 ?C) 97.6 ?F (36.4 ?C)  ?TempSrc:  Oral Oral Oral  ?SpO2: 100% 100% 100% 99%  ?Weight:    56 kg  ?Height:      ? ? ?Intake/Output Summary (Last 24 hours) at 10/21/2021 1114 ?Last data filed at 10/21/2021 0900 ?Gross per 24 hour  ?Intake 593.83 ml  ?Output 0 ml  ?Net 593.83 ml  ? ?Filed Weights  ? 10/19/21 2100 10/21/21 0513  ?Weight: 61.9 kg 56 kg  ? ? ?Examination: ? ?General exam: Appears calm and comfortable  ?Frail and debilitated.  Age-appropriate.  On room air. ?Respiratory system: Clear to auscultation. Respiratory effort normal. ?Cardiovascular system: S1 & S2 heard, RRR.  Left precordial pacemaker in place.  No pedal edema. ?Gastrointestinal system: Abdomen is nondistended, soft and nontender. No organomegaly or masses felt. Normal bowel sounds heard. ?Central nervous system: Alert and oriented. No focal neurological deficits. ?Extremities: Symmetric 5 x 5 power. ?Skin: No rashes, lesions or ulcers ?Psychiatry:  Judgement and insight appear normal. Mood & affect appropriate.  ? ? ? ?Data Reviewed: I have personally reviewed following labs and imaging studies ? ?CBC: ?Recent Labs  ?Lab 10/19/21 ?2138 10/20/21 ?1017 10/21/21 ?0206  ?WBC  10.6* 10.1 7.5  ?NEUTROABS 9.3*  --   --   ?HGB 10.4* 11.6* 10.6*  ?HCT 31.4* 33.8* 30.2*  ?MCV 92.4 89.7 88.0  ?PLT 167 159 154  ? ?Basic Metabolic Panel: ?Recent Labs  ?Lab 10/19/21 ?2138 10/20/21 ?5102 10/21/21 ?0206  ?NA 139 139 139  ?K 4.5 4.6 3.4*  ?CL 107 106 106  ?CO2 20* 24 24  ?GLUCOSE 145* 111* 111*  ?BUN 51* 45* 38*  ?CREATININE 2.87* 2.31* 2.05*  ?CALCIUM 9.2 9.6 8.8*  ? ?GFR: ?Estimated Creatinine Clearance: 22.8 mL/min (A) (by C-G formula based on SCr of 2.05 mg/dL (H)). ?Liver Function Tests: ?Recent Labs  ?Lab 10/19/21 ?2138  ?AST 18  ?ALT 16  ?ALKPHOS 46  ?BILITOT 0.7  ?PROT 6.8  ?ALBUMIN 3.8  ? ?No results for input(s): LIPASE, AMYLASE in the last 168 hours. ?No results for input(s): AMMONIA in the last 168 hours. ?Coagulation Profile: ?No results for input(s): INR, PROTIME in the last 168 hours. ?Cardiac Enzymes: ?No results for input(s): CKTOTAL, CKMB, CKMBINDEX, TROPONINI in the last 168 hours. ?BNP (last 3 results) ?No results for input(s): PROBNP in the last 8760 hours. ?HbA1C: ?No results for input(s): HGBA1C in the last 72 hours. ?CBG: ?No results for input(s): GLUCAP in the last 168 hours. ?Lipid Profile: ?No results for input(s): CHOL, HDL, LDLCALC, TRIG, CHOLHDL, LDLDIRECT in the last 72 hours. ?Thyroid Function Tests: ?No results for input(s): TSH, T4TOTAL, FREET4, T3FREE, THYROIDAB in the last 72 hours. ?Anemia Panel: ?No results for input(s): VITAMINB12, FOLATE, FERRITIN, TIBC, IRON, RETICCTPCT in the last 72 hours. ?Sepsis Labs: ?No results for input(s): PROCALCITON, LATICACIDVEN in the last 168 hours. ? ?No results found for this or any previous visit (from the past 240 hour(s)).  ? ? ? ? ? ?Radiology Studies: ?DG Chest Port 1 View ? ?Result Date: 10/19/2021 ?CLINICAL DATA:  Syncope EXAM: PORTABLE CHEST 1 VIEW COMPARISON:  04/24/2021 FINDINGS: The heart size and mediastinal contours are within normal limits. Both lungs are clear. The visualized skeletal structures are unremarkable.  There are multiple shadows over the chest related to external objects. IMPRESSION: No active disease. Electronically Signed   By: Ulyses Jarred M.D.   On: 10/19/2021 21:30   ? ? ? ? ? ?Scheduled Meds: ? aspirin EC  81 mg Oral Daily  ? fludrocortisone  0.1 mg Oral Daily  ? heparin  5,000 Units Subcutaneous Q8H  ? midodrine  10 mg Oral TID with meals  ? mirtazapine  7.5 mg Oral Daily  ? sodium chloride flush  3 mL Intravenous Q12H  ? ?Continuous Infusions: ? ? ? ? LOS: 2 days  ? ? ?Time spent: 35 minutes ? ? ? ?Barb Merino, MD ?[facility] ?Pager [phone]  ?

## 2021-10-21 NOTE — Evaluation (Addendum)
Occupational Therapy Evaluation ?Patient Details ?Name: Alan [name] ?MRN: [id number] ?DOB: Sep 25, 1940 ?Today's Date: 10/21/2021 ? ? ?History of Present Illness Pt is a 81 y.o. M who presents with orthostatic hypotension and recurrent syncopal episodes 10/19/2021. Significant PMH: malignant carcinoid tumor, short-bowel syndrome, chronic diarrhea, CKD stage IIIb, depression and anxiety, severe orthostatic hypotension  ? ?Clinical Impression ?  ?Pt is typically mod I for ADL (uses shower chair in shower) does not use DME for mobility. Wife reports frequent falls. Today Pt is overall min guard. Positive for orthostatics - see below, able to perform LB dressing, sink level grooming, toilet transfer, peri care in standing all at min guard level. Pt presents with overall generalized weakness and deconditioning, decreased activity tolerance for participation in ADL. At this time recommending SNF post-acute to maximize safety and independence in ADL and functional transfers. ? ?131/101 (111) sitting EOB ?157/101 (119) sitting EOB after 3 min ?93/72 (58) standing ?74/49 (58) standing after 5 min ?162/108 (124) sitting  ? ?Next session plan to bring theraband and written HEP for general exercises to address generalized weakness.  ?   ? ?Recommendations for follow up therapy are one component of a multi-disciplinary discharge planning process, led by the attending physician.  Recommendations may be updated based on patient status, additional functional criteria and insurance authorization.  ? ?Follow Up Recommendations ? Skilled nursing-short term rehab (<3 hours/day)  ?  ?Assistance Recommended at Discharge Intermittent Supervision/Assistance  ?Patient can return home with the following A little help with bathing/dressing/bathroom;Assistance with cooking/housework;Help with stairs or ramp for entrance ? ?  ?Functional Status Assessment ? Patient has had a recent decline in their functional status and demonstrates the ability to  make significant improvements in function in a reasonable and predictable amount of time.  ?Equipment Recommendations ? None recommended by OT (Pt has appropriate DME)  ?  ?Recommendations for Other Services PT consult ? ? ?  ?Precautions / Restrictions Precautions ?Precautions: Fall;Other (comment) ?Precaution Comments: watch BP ?Restrictions ?Weight Bearing Restrictions: No  ? ?  ? ?Mobility Bed Mobility ?Overal bed mobility: Needs Assistance ?Bed Mobility: Supine to Sit ?  ?  ?Supine to sit: Min guard ?  ?  ?General bed mobility comments: no physical assist needed ?  ? ?Transfers ?Overall transfer level: Needs assistance ?Equipment used: None ?Transfers: Sit to/from Stand ?Sit to Stand: Min guard ?  ?  ?  ?  ?  ?  ?  ? ?  ?Balance Overall balance assessment: Needs assistance ?Sitting-balance support: Feet supported ?Sitting balance-Leahy Scale: Fair ?  ?  ?Standing balance support: No upper extremity supported, During functional activity ?Standing balance-Leahy Scale: Poor ?Standing balance comment: reaching out for external support, minA for balance ?  ?  ?  ?  ?  ?  ?  ?  ?  ?  ?  ?   ? ?ADL either performed or assessed with clinical judgement  ? ?ADL Overall ADL's : Needs assistance/impaired ?Eating/Feeding: Modified independent ?  ?Grooming: Wash/dry hands;Wash/dry face;Oral care;Min guard;Standing ?Grooming Details (indicate cue type and reason): sink level ?Upper Body Bathing: Min guard;Sitting ?  ?Lower Body Bathing: Min guard;Sitting/lateral leans ?  ?Upper Body Dressing : Set up;Sitting ?  ?Lower Body Dressing: Minimal assistance;Min guard;Sit to/from stand ?Lower Body Dressing Details (indicate cue type and reason): able to perform figure 4 ?Toilet Transfer: Min guard;Minimal assistance;Ambulation ?Toilet Transfer Details (indicate cue type and reason): holding onto gait belt for safety ?Toileting- Water quality scientist and Hygiene: Min guard;Sit to/from  stand ?  ?Tub/ Shower Transfer: Min guard ?   ?Functional mobility during ADLs: Min guard ?General ADL Comments: decreased activity tolerance and generalized weakness  ? ? ? ?Vision Baseline Vision/History: 1 Wears glasses ?Ability to See in Adequate Light: 0 Adequate ?Patient Visual Report: No change from baseline ?Vision Assessment?: No apparent visual deficits  ?   ?Perception   ?  ?Praxis   ?  ? ?Pertinent Vitals/Pain Pain Assessment ?Pain Assessment: Faces ?Faces Pain Scale: Hurts a little bit ?Pain Location: chronic neck and back pain ?Pain Descriptors / Indicators: Grimacing ?Pain Intervention(s): Monitored during session  ? ? ? ?Hand Dominance Right ?  ?Extremity/Trunk Assessment Upper Extremity Assessment ?Upper Extremity Assessment: Generalized weakness ?  ?Lower Extremity Assessment ?Lower Extremity Assessment: Generalized weakness ?  ?Cervical / Trunk Assessment ?Cervical / Trunk Assessment: Kyphotic ?  ?Communication Communication ?Communication: No difficulties ?  ?Cognition Arousal/Alertness: Awake/alert ?Behavior During Therapy: [facility] for tasks assessed/performed ?Overall Cognitive Status: Within Functional Limits for tasks assessed ?  ?  ?  ?  ?  ?  ?  ?  ?  ?  ?  ?  ?  ?  ?  ?  ?  ?  ?  ?General Comments  wife present throughout session ? ?  ?Exercises   ?  ?Shoulder Instructions    ? ? ?Home Living Family/patient expects to be discharged to:: Private residence ?Living Arrangements: Spouse/significant other ?Available Help at Discharge: Family ?Type of Home: House ?Home Access: Stairs to enter ?Entrance Stairs-Number of Steps: 5 ?Entrance Stairs-Rails: Can reach both ?Home Layout: One level ?  ?  ?Bathroom Shower/Tub: Walk-in shower ?  ?Bathroom Toilet: Handicapped height ?Bathroom Accessibility: Yes ?  ?Home Equipment: Wheelchair - Publishing copy (2 wheels);Cane - single point;Shower seat ?  ?Additional Comments: Pt reports (I) with all ADLs and mobility. He lives with his wife and son lives close and can provide intermittent assist if  needed. Pt is able to enter house usingback door with no stairs to enter at home. ?  ? ?  ?Prior Functioning/Environment Prior Level of Function : Needs assist ?  ?  ?  ?  ?  ?  ?Mobility Comments: uses cane intermittently for balance ?ADLs Comments: sits to shower with wife supervising, takes cool shower to prevent vasovagal episodes ?  ? ?  ?  ?OT Problem List: Decreased activity tolerance;Impaired balance (sitting and/or standing) ?  ?   ?OT Treatment/Interventions: Self-care/ADL training;Therapeutic exercise;DME and/or AE instruction;Therapeutic activities;Patient/family education;Balance training  ?  ?OT Goals(Current goals can be found in the care plan section) Acute Rehab OT Goals ?Patient Stated Goal: get stronger and independent ?OT Goal Formulation: With patient/family ?Time For Goal Achievement: 11/04/21 ?Potential to Achieve Goals: Good ?ADL Goals ?Pt Will Perform Grooming: with modified independence;standing ?Pt Will Perform Upper Body Dressing: with modified independence;sitting ?Pt Will Perform Lower Body Dressing: with modified independence;sit to/from stand ?Pt Will Transfer to Toilet: with supervision;ambulating ?Pt Will Perform Toileting - Clothing Manipulation and hygiene: with modified independence;sit to/from stand ?Pt/caregiver will Perform Home Exercise Program: Increased strength;Both right and left upper extremity;With theraband;Independently;With written HEP provided  ?OT Frequency: Min 2X/week ?  ? ?Co-evaluation   ?  ?  ?  ?  ? ?  ?AM-PAC OT "6 Clicks" Daily Activity     ?Outcome Measure Help from another person eating meals?: None ?Help from another person taking care of personal grooming?: A Little ?Help from another person toileting, which includes using toliet, bedpan, or urinal?:  A Little ?Help from another person bathing (including washing, rinsing, drying)?: A Little ?Help from another person to put on and taking off regular upper body clothing?: A Little ?Help from another person  to put on and taking off regular lower body clothing?: A Little ?6 Click Score: 19 ?  ?End of Session Equipment Utilized During Treatment: Gait belt ?Nurse Communication: Mobility status;Other (comment)

## 2021-10-22 DIAGNOSIS — R55 Syncope and collapse: Secondary | ICD-10-CM | POA: Diagnosis not present

## 2021-10-22 DIAGNOSIS — N179 Acute kidney failure, unspecified: Secondary | ICD-10-CM | POA: Diagnosis not present

## 2021-10-22 DIAGNOSIS — I951 Orthostatic hypotension: Secondary | ICD-10-CM | POA: Diagnosis not present

## 2021-10-22 DIAGNOSIS — N1832 Chronic kidney disease, stage 3b: Secondary | ICD-10-CM | POA: Diagnosis not present

## 2021-10-22 LAB — BASIC METABOLIC PANEL
Anion gap: 7 (ref 5–15)
BUN: 41 mg/dL — ABNORMAL HIGH (ref 8–23)
CO2: 27 mmol/L (ref 22–32)
Calcium: 9.1 mg/dL (ref 8.9–10.3)
Chloride: 106 mmol/L (ref 98–111)
Creatinine, Ser: 2.18 mg/dL — ABNORMAL HIGH (ref 0.61–1.24)
GFR, Estimated: 30 mL/min — ABNORMAL LOW (ref >60)
Glucose, Bld: 117 mg/dL — ABNORMAL HIGH (ref 70–99)
Potassium: 3.8 mmol/L (ref 3.5–5.1)
Sodium: 140 mmol/L (ref 135–145)

## 2021-10-22 LAB — CBC
HCT: 30.1 % — ABNORMAL LOW (ref 39.0–52.0)
Hemoglobin: 10.6 g/dL — ABNORMAL LOW (ref 13.0–17.0)
MCH: 31.1 pg (ref 26.0–34.0)
MCHC: 35.2 g/dL (ref 30.0–36.0)
MCV: 88.3 fL (ref 80.0–100.0)
Platelets: 149 10*3/uL — ABNORMAL LOW (ref 150–400)
RBC: 3.41 MIL/uL — ABNORMAL LOW (ref 4.22–5.81)
RDW: 13 % (ref 11.5–15.5)
WBC: 8.1 10*3/uL (ref 4.0–10.5)
nRBC: 0 % (ref 0.0–0.2)

## 2021-10-22 MED ORDER — MIRTAZAPINE 15 MG PO TABS
15.0000 mg | ORAL_TABLET | Freq: Every day | ORAL | Status: DC
  Administered 2021-10-22 – 2021-10-23 (×2): 15 mg via ORAL
  Filled 2021-10-22 (×2): qty 1

## 2021-10-22 MED ORDER — MIDODRINE HCL 5 MG PO TABS
15.0000 mg | ORAL_TABLET | Freq: Three times a day (TID) | ORAL | Status: DC
  Administered 2021-10-22 – 2021-10-24 (×6): 15 mg via ORAL
  Filled 2021-10-22 (×6): qty 3

## 2021-10-22 NOTE — Progress Notes (Signed)
?PROGRESS NOTE ? ? ? ?Alan [name] Reine  EHM:[id number] DOB: [birth year] DOA: 10/19/2021 ?PCP: [name], MD  ? ? ?Brief Narrative:  ?81 year old gentleman with history of malignant carcinoid tumor, short-bowel syndrome, chronic diarrhea, CKD stage IIIb, depression and anxiety, severe orthostatic hypotension presented to the emergency room with lightheadedness on standing and recurrent syncopal episodes.  Has a long history of orthostatic hypotension and currently on fludrocortisone and midodrine.  Were symptoms for last 3 days with multiple syncopal episode.  In the ER hemodynamically stable.  Creatinine 2.87 slightly worse from baseline.  Blood pressure dropped from 366-29 systolic.  Given IV fluids and admitted with significant symptoms. ? ? ?Assessment & Plan: ?  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 ?Continue fludrocortisone and midodrine, treated with IV fluids with improvement of orthostatic symptoms. ?Compression stockings.  Orthostatic precautions.  Accept supine hypertension to make enough room for drop in blood pressure.  Clinically improving. ? ?Acute kidney injury superimposed on CKD stage IIIb: ?Presentation creatinine 2.87, recent creatinine 2.09.  Likely prerenal in the setting of poor appetite and chronic diarrhea with weight loss.  Clinically improving so encourage oral intake.  Creatinine 2.18. ? 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  No more diarrhea. ? ?Paroxysmal A-fib: Currently sinus rhythm.  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  On aspirin for anticoagulation.  Heart rate is controlled. ? ?Depression and anxiety: On Remeron. ? ?Medically stable to transfer to a skilled level of care. ? ? ?DVT prophylaxis: heparin injection 5,000 Units Start: 10/20/21 0600 ? ? ?Code Status: Full code. ?Family Communication: None  today. ?Disposition Plan: Status is: Inpatient ?Remains inpatient appropriate because: Needs a skilled rehab. ? ?Consultants:  ?None ? ?Procedures:  ?None ? ?Antimicrobials:  ?None ? ? ?Subjective: ? ?Patient seen and examined.  Denies any complaints.  Very eager to go home or get out of the hospital.  Does not like to be here.  He is very appreciative of care.  He had orthostatic drop in blood pressure, however did tolerate it well. ?Last orthostatic vitals with blood pressures as below, ?131/101  sitting EOB ?157/101  sitting EOB after 3 min ?93/72  standing ?74/49 standing after 5 min, did not feel any dizziness. ?162/108  sitting  ? ?Objective: ?Vitals:  ? 10/21/21 1635 10/21/21 2045 10/22/21 0543 10/22/21 4765  ?BP: (!) 151/101 (!) 187/106 (!) 159/102 (!) 174/102  ?Pulse: 76 66 71 74  ?Resp: (!) '22 18 18 14  '$ ?Temp: 97.8 ?F (36.6 ?C) 98 ?F (36.7 ?C) (!) 97.5 ?F (36.4 ?C) (!) 97.4 ?F (36.3 ?C)  ?TempSrc: Oral Oral Oral Oral  ?SpO2: 100% 99% 98% 100%  ?Weight:      ?Height:      ? ? ?Intake/Output Summary (Last 24 hours) at 10/22/2021 1031 ?Last data filed at 10/22/2021 4650 ?Gross per 24 hour  ?Intake 460 ml  ?Output 800 ml  ?Net -340 ml  ? ?Filed Weights  ? 10/19/21 2100 10/21/21 0513  ?Weight: 61.9 kg 56 kg  ? ? ?Examination: ? ?General: Comfortable.  Talkative. ?Cardiovascular: S1-S2 normal.  Pacemaker in place. ?Respiratory: Bilateral clear.  No added sounds. ?Gastrointestinal: Soft.  Nontender.  Bowel sounds present. ?Ext: No edema or swelling.  Pigmentation of the legs. ?Neuro: Intact. ?Musculoskeletal: No deformities. ? ? ? ? ?Data Reviewed: I have personally reviewed following labs and imaging studies ? ?CBC: ?Recent Labs  ?  Lab 10/19/21 ?2138 10/20/21 ?6295 10/21/21 ?0206 10/22/21 ?2841  ?WBC 10.6* 10.1 7.5 8.1  ?NEUTROABS 9.3*  --   --   --   ?HGB 10.4* 11.6* 10.6* 10.6*  ?HCT 31.4* 33.8* 30.2* 30.1*  ?MCV 92.4 89.7 88.0 88.3  ?PLT 167 159 154 149*  ? ?Basic Metabolic Panel: ?Recent Labs  ?Lab  10/19/21 ?2138 10/20/21 ?3244 10/21/21 ?0206 10/22/21 ?0102  ?NA 139 139 139 140  ?K 4.5 4.6 3.4* 3.8  ?CL 107 106 106 106  ?CO2 20* '24 24 27  '$ ?GLUCOSE 145* 111* 111* 117*  ?BUN 51* 45* 38* 41*  ?CREATININE 2.87* 2.31* 2.05* 2.18*  ?CALCIUM 9.2 9.6 8.8* 9.1  ? ?GFR: ?Estimated Creatinine Clearance: 21.4 mL/min (A) (by C-G formula based on SCr of 2.18 mg/dL (H)). ?Liver Function Tests: ?Recent Labs  ?Lab 10/19/21 ?2138  ?AST 18  ?ALT 16  ?ALKPHOS 46  ?BILITOT 0.7  ?PROT 6.8  ?ALBUMIN 3.8  ? ?No results for input(s): LIPASE, AMYLASE in the last 168 hours. ?No results for input(s): AMMONIA in the last 168 hours. ?Coagulation Profile: ?No results for input(s): INR, PROTIME in the last 168 hours. ?Cardiac Enzymes: ?No results for input(s): CKTOTAL, CKMB, CKMBINDEX, TROPONINI in the last 168 hours. ?BNP (last 3 results) ?No results for input(s): PROBNP in the last 8760 hours. ?HbA1C: ?No results for input(s): HGBA1C in the last 72 hours. ?CBG: ?No results for input(s): GLUCAP in the last 168 hours. ?Lipid Profile: ?No results for input(s): CHOL, HDL, LDLCALC, TRIG, CHOLHDL, LDLDIRECT in the last 72 hours. ?Thyroid Function Tests: ?No results for input(s): TSH, T4TOTAL, FREET4, T3FREE, THYROIDAB in the last 72 hours. ?Anemia Panel: ?No results for input(s): VITAMINB12, FOLATE, FERRITIN, TIBC, IRON, RETICCTPCT in the last 72 hours. ?Sepsis Labs: ?No results for input(s): PROCALCITON, LATICACIDVEN in the last 168 hours. ? ?No results found for this or any previous visit (from the past 240 hour(s)).  ? ? ? ? ? ?Radiology Studies: ?No results found. ? ? ? ? ? ?Scheduled Meds: ? aspirin EC  81 mg Oral Daily  ? fludrocortisone  0.1 mg Oral Daily  ? heparin  5,000 Units Subcutaneous Q8H  ? midodrine  10 mg Oral TID with meals  ? mirtazapine  7.5 mg Oral Daily  ? sodium chloride flush  3 mL Intravenous Q12H  ? ?Continuous Infusions: ? ? ? ? LOS: 3 days  ? ? ?Time spent: 35 minutes ? ? ? ?Barb Merino, MD ?[facility] ?Pager [phone]  ?

## 2021-10-22 NOTE — NC FL2 (Signed)
?[place] MEDICAID FL2 LEVEL OF CARE SCREENING TOOL  ?  ? ?IDENTIFICATION  ?Patient Name: ?Alan [name] Birthdate: 1941/05/06 Sex: male Admission Date (Current Location): ?10/19/2021  ?[place] and [place] Number: ? [place] ?  Facility and Address:  ?[facility], [place] [address] [place], [place] [address] ?     Provider Number: ?[id number]  ?Attending Physician Name and Address:  ?Barb Merino, MD ? Relative Name and Phone Number:  ?[name] Cohenour, [phone] ?   ?Current Level of Care: ?Hospital Recommended Level of Care: ?[place] Prior Approval Number: ?  ? ?Date Approved/Denied: ?  PASRR Number: ?[id number] A ? ?Discharge Plan: ?SNF ?  ? ?Current Diagnoses: ?Patient Active Problem List  ? Diagnosis Date Noted  ? Acute renal failure superimposed on stage 3b chronic kidney disease ([place]) 10/19/2021  ? Oropharyngeal dysphagia 06/27/2021  ? Short gut syndrome   ? Syncope 04/22/2021  ? Major depressive disorder 04/22/2021  ? Sinus node dysfunction ([place]) 04/22/2021  ? Atrial fibrillation with rapid ventricular response ([place]) 04/22/2021  ? AV block, 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  ? Orthostatic hypotension 02/20/2021  ? Iron deficiency anemia due to chronic blood loss 02/20/2021  ? Chronic diarrhea 01/18/2021  ? 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[place]) 01/18/2021  ? GIB (gastrointestinal bleeding) 01/18/2021  ? Essential hypertension 01/18/2021  ? Hypokalemia due to excessive gastrointestinal loss of potassium 01/18/2021  ? Chronic kidney disease, stage 3a ([place]) 01/18/2021  ? ? ?Orientation RESPIRATION BLADDER Height & Weight   ?  ?Self, Time, Situation, Place ? Normal Continent Weight: 123 lb 7.3 oz (56 kg) ?Height:  '6\' 1"'$  (185.4 cm)  ?BEHAVIORAL SYMPTOMS/MOOD NEUROLOGICAL BOWEL NUTRITION STATUS  ?    Continent Diet (See DC summary)  ?AMBULATORY STATUS COMMUNICATION OF NEEDS Skin   ?Limited Assist Verbally Normal ?  ?  ?  ?    ?     ?     ? ? ?Personal Care Assistance  Level of Assistance  ?Bathing, Feeding, Dressing Bathing Assistance: Limited assistance ?Feeding assistance: Independent ?Dressing Assistance: Limited assistance ?   ? ?Functional Limitations Info  ?Sight, Hearing, Speech Sight Info: Adequate ?Hearing Info: Adequate ?Speech Info: Adequate  ? ? ?SPECIAL CARE FACTORS FREQUENCY  ?PT (By licensed PT), OT (By licensed OT)   ?  ?PT Frequency: 5x week ?OT Frequency: 5x week ?  ?  ?  ?   ? ? ?Contractures Contractures Info: Not present  ? ? ?Additional Factors Info  ?Code Status, Allergies Code Status Info: Full ?Allergies Info: Penicillins, Lisinopril ?  ?  ?  ?   ? ?Current Medications (10/22/2021):  This is the current hospital active medication list ?Current Facility-Administered Medications  ?Medication Dose Route Frequency Provider Last Rate Last Admin  ? acetaminophen (TYLENOL) tablet 650 mg  650 mg Oral Q6H PRN Opyd, Ilene Qua, MD      ? Or  ? acetaminophen (TYLENOL) suppository 650 mg  650 mg Rectal Q6H PRN Opyd, Ilene Qua, MD      ? albuterol (PROVENTIL) (2.5 MG/3ML) 0.083% nebulizer solution 3 mL  3 mL Inhalation Q6H PRN Opyd, Ilene Qua, MD   3 mL at 10/20/21 1349  ? aspirin EC tablet 81 mg  81 mg Oral Daily Opyd, Ilene Qua, MD   81 mg at 10/21/21 4008  ? fludrocortisone (FLORINEF) tablet 0.1 mg  0.1 mg Oral Daily Opyd, Ilene Qua, MD   0.1 mg at 10/21/21 0847  ? heparin injection 5,000  Units  5,000 Units Subcutaneous Q8H Opyd, Ilene Qua, MD   5,000 Units at 10/22/21 0543  ? midodrine (PROAMATINE) tablet 10 mg  10 mg Oral TID with meals Opyd, Ilene Qua, MD   10 mg at 10/21/21 1736  ? mirtazapine (REMERON) tablet 7.5 mg  7.5 mg Oral Daily Opyd, Ilene Qua, MD   7.5 mg at 10/21/21 2202  ? ondansetron (ZOFRAN) tablet 4 mg  4 mg Oral Q6H PRN Opyd, Ilene Qua, MD      ? Or  ? ondansetron (ZOFRAN) injection 4 mg  4 mg Intravenous Q6H PRN Opyd, Ilene Qua, MD      ? sodium chloride flush (NS) 0.9 % injection 3 mL  3 mL Intravenous Q12H Opyd, Ilene Qua, MD   3 mL at 10/21/21  2137  ? ? ? ?Discharge Medications: ?Please see discharge summary for a list of discharge medications. ? ?Relevant Imaging Results: ? ?Relevant Lab Results: ? ? ?Additional Information ?SS# [id number] ? ?Coralee Pesa, LCSWA ? ? ? ? ?

## 2021-10-22 NOTE — Progress Notes (Signed)
Physical Therapy Treatment ?Patient Details ?Name: Alan [name] ?MRN: [id number] ?DOB: 10-Nov-1940 ?Today's Date: 10/22/2021 ? ? ?History of Present Illness Pt is a 81 y.o. M who presents with orthostatic hypotension and recurrent syncopal episodes 10/19/2021. Significant PMH: malignant carcinoid tumor, short-bowel syndrome, chronic diarrhea, CKD stage IIIb, depression and anxiety, severe orthostatic hypotension ? ?  ?PT Comments  ? ? Session limited by symptomatic orthostatics. Patient reporting dizziness and lightheaded in standing and requesting to sit down. While sitting, patient tangential then progressively started slurring speech then had change in LOC and not responding. Reclined patient and raised feet in air while in chair with slow increase in BP. Notified RN immediately and present following event. Continue to recommend SNF for ongoing Physical Therapy.     ? ?Orthostatic BPs ? ?Sitting 116/85  ?Standing  83/54  ?Sitting after 2 min 66/48  ?Sitting (reclined + legs in air) 80/58 --> 91/63  ? ?  ?Recommendations for follow up therapy are one component of a multi-disciplinary discharge planning process, led by the attending physician.  Recommendations may be updated based on patient status, additional functional criteria and insurance authorization. ? ?Follow Up Recommendations ? Skilled nursing-short term rehab (<3 hours/day) ?  ?  ?Assistance Recommended at Discharge Frequent or constant Supervision/Assistance  ?Patient can return home with the following A little help with walking and/or transfers;A little help with bathing/dressing/bathroom;Assistance with cooking/housework;Assist for transportation;Help with stairs or ramp for entrance ?  ?Equipment Recommendations ? None recommended by PT  ?  ?Recommendations for Other Services   ? ? ?  ?Precautions / Restrictions Precautions ?Precautions: Fall;Other (comment) ?Precaution Comments: watch BP ?Restrictions ?Weight Bearing Restrictions: No  ?  ? ?Mobility ?  Bed Mobility ?  ?  ?  ?  ?  ?  ?  ?General bed mobility comments: in recliner on arrival ?  ? ?Transfers ?Overall transfer level: Needs assistance ?Equipment used: None ?Transfers: Sit to/from Stand ?Sit to Stand: Min guard ?  ?  ?  ?  ?  ?General transfer comment: min guard for safety ?  ? ?Ambulation/Gait ?  ?  ?  ?  ?  ?  ?  ?General Gait Details: deferred due to symptomatic orthostatics ? ? ?Stairs ?  ?  ?  ?  ?  ? ? ?Wheelchair Mobility ?  ? ?Modified Rankin (Stroke Patients Only) ?  ? ? ?  ?Balance Overall balance assessment: Needs assistance ?Sitting-balance support: Feet supported ?Sitting balance-Leahy Scale: Fair ?  ?  ?Standing balance support: No upper extremity supported, During functional activity ?Standing balance-Leahy Scale: Poor ?  ?  ?  ?  ?  ?  ?  ?  ?  ?  ?  ?  ?  ? ?  ?Cognition Arousal/Alertness: Awake/alert ?Behavior During Therapy: [facility] for tasks assessed/performed ?Overall Cognitive Status: Within Functional Limits for tasks assessed ?  ?  ?  ?  ?  ?  ?  ?  ?  ?  ?  ?  ?  ?  ?  ?  ?General Comments: tangential ?  ?  ? ?  ?Exercises   ? ?  ?General Comments   ?  ?  ? ?Pertinent Vitals/Pain Pain Assessment ?Pain Assessment: No/denies pain  ? ? ?Home Living   ?  ?  ?  ?  ?  ?  ?  ?  ?  ?   ?  ?Prior Function    ?  ?  ?   ? ?  PT Goals (current goals can now be found in the care plan section) Acute Rehab PT Goals ?Patient Stated Goal: get stronger ?PT Goal Formulation: With patient/family ?Time For Goal Achievement: 11/03/21 ?Potential to Achieve Goals: Good ?Progress towards PT goals: Not progressing toward goals - comment (due to orthostasis) ? ?  ?Frequency ? ? ? Min 3X/week ? ? ? ?  ?PT Plan Current plan remains appropriate  ? ? ?Co-evaluation   ?  ?  ?  ?  ? ?  ?AM-PAC PT "6 Clicks" Mobility   ?Outcome Measure ? Help needed turning from your back to your side while in a flat bed without using bedrails?: A Little ?Help needed moving from lying on your back to sitting on the side of a flat bed  without using bedrails?: A Little ?Help needed moving to and from a bed to a chair (including a wheelchair)?: A Little ?Help needed standing up from a chair using your arms (e.g., wheelchair or bedside chair)?: A Little ?Help needed to walk in hospital room?: A Lot ?Help needed climbing 3-5 steps with a railing? : Total ?6 Click Score: 15 ? ?  ?End of Session Equipment Utilized During Treatment: Gait belt ?Activity Tolerance: Treatment limited secondary to medical complications (Comment) (symptomatic orthostasis) ?Patient left: in chair;with call bell/phone within reach;with chair alarm set ?Nurse Communication: Mobility status;Other (comment) (orthostatics) ?PT Visit Diagnosis: Unsteadiness on feet (R26.81);Other abnormalities of gait and mobility (R26.89);Repeated falls (R29.6);Muscle weakness (generalized) (M62.81);Difficulty in walking, not elsewhere classified (R26.2) ?  ? ? ?Time: 2440-1027 ?PT Time Calculation (min) (ACUTE ONLY): 17 min ? ?Charges:  $Therapeutic Activity: 8-22 mins          ?          ? ?Rhylie Stehr A. [place] Rile, PT, DPT ?Acute Rehabilitation Services ?Pager [phone] ?Office [phone] ? ? ? ?Mireyah Chervenak A Casaundra Takacs ?10/22/2021, 4:16 PM ? ?

## 2021-10-22 NOTE — Care Management Important Message (Signed)
Important Message ? ?Patient Details  ?Name: Alan [name] ?MRN: [id number] ?Date of Birth: Sep 03, 1940 ? ? ?Medicare Important Message Given:  Yes ? ? ? ? ?[name] ?10/22/2021, 1:59 PM ?

## 2021-10-22 NOTE — TOC Initial Note (Signed)
Transition of Care (TOC) - Initial/Assessment Note  ? ? ?Patient Details  ?Name: Alan [name] ?MRN: [id number] ?Date of Birth: 08-27-1940 ? ?Transition of Care (TOC) CM/SW Contact:    ?Paulene Floor Kazumi Lachney, LCSWA ?Phone Number: ?10/22/2021, 10:43 AM ? ?Clinical Narrative:                 ?Late Entry ? ?CSW received consult for possible SNF placement at time of discharge. CSW spoke with patient.  Patient expressed understanding of PT recommendation and is agreeable to SNF placement at time of discharge. Patient reports preference for [facility] and [facility] CSW discussed insurance authorization process and will provide Medicare SNF ratings list. Patient has received 4 COVID vaccines. CSW will send out referrals for review.  No further questions reported at this time.  ? ?[facility] can accept patient.  Patient's spouse notified.  Pending insurance auth. ? ?Skilled Nursing Rehab Facilities-   [email or web address]   Ratings out of 5 possible   ?Name Address  Phone # Quality Care Staffing Health Inspection Overall  ?[facility] [address] [place] '4 5 2 3  '$ ?[facility]  [address], [place] [phone] '3 2 5 5  '$ ?[facility] [place], [place] '3 1 1 1  '$ ?[facility] [place], [place] '3 2 4 4  '$ ?[facility] [address], [place] '1 1 2 1  '$ ?[facility] [place] '2 1 4 3  '$ ?[facility] [address] [place] '5 2 3 4  '$ ?[facility] [address] [place] '5 2 2 3  '$ ?[facility] [place] [phone] '5 1 2 2  '$ ?[facility] [address], [place] [phone] '4 1 2 1  '$ ?[facility] [address], [place] [phone] '4 1 2 1  '$ ?[facility] ([place]) [place]. [place] [phone] '4 1 1 1  '$ ?[facility]  [address] [place] [phone] '3 2 4 4  '$ ?        ?[facility] [address] [place]      ?[facility] [place] '4 2 3 3  '$ ?[facility] [place], [place] '4 1 5 4  '$ ?[facility] [address] [place] [address], [place] [phone] '2 1 1 1  '$ ?[facility] [address], [place] [phone] '2 1 3 2  '$ ?        ?[address] (no [facility]) [address], [place] [phone] '4 5 5 5  '$ ?[facility] (No Humana) [address], [place] '3 1 4 3  '$ ?[facility] (No UHC) [address], [place] [phone] '5 5 5 5  '$ ?[facility] [address], [place] [employer] [phone] '3 2 4 4  '$ ?[facility] [address] [place] '1 1 2 1  '$ ?[facility] [address] [place] [phone] '2 1 1 1  '$ ?[facility] [place] '5 2 4 5  '$ ?[facility] [address] [place] '3 1 1 1  '$ ?[facility] [place], [place] '2 1 2 1  '$ ?        ?[facility] [address] [place] '1 1 1 1  '$ ?[facility] [address], [place]  [phone] '2 4 2 2  '$ ?[facility] [place] [address], [place] [phone]  $'2 3 4  'Z$ ?[place] [address] [place] [address] [place] '2 1 1 1  '$ ?[facility] (No Humana) [address] [place] '2 1 3 2  '$ ?[facility] [address], [place] [phone] '3 1 1 1  '$ ?        ?[facility] [place], [place] '5 4 5 5  '$ ?[facility])  [address], [place] '2 2 3 3  '$ ?[facility]) [place], [place] '3 2 4 4  '$ ?[facility] [address] [place] '4 3 4 4  '$ ?[address] [place], [place] '3 3 1 1  '$ ?[facility]) [address] [place] [phone] '2 2 4 4  '$ ?  ? ?Expected Discharge Plan: [place] ?Barriers to Discharge: Insurance Authorization ? ? ?Patient Goals and CMS Choice ?Patient states their goals for this hospitalization and ongoing recovery are:: To get home after rehab ?CMS Medicare.gov Compare Post Acute Care list provided to:: Patient ?Choice offered to / list presented to : Patient, Spouse ? ?Expected Discharge Plan and Services ?Expected Discharge Plan: [place] ?  ?  ?  ?Living arrangements for the past 2 months: [place] ?                ?  ?  ?  ?  ?  ?  ?  ?  ?  ?  ? ?Prior Living Arrangements/Services ?Living arrangements for the past 2 months: [place] ?Lives with:: Spouse, Self ?Patient language and need for interpreter reviewed:: Yes ?Do you feel safe going back to the place where you live?: Yes      ?Need for Family Participation in Patient Care: No (Comment) ?Care giver support system in place?: Yes (comment) ?  ?Criminal Activity/Legal Involvement Pertinent to Current Situation/Hospitalization: No - Comment as needed ? ?Activities of Daily Living ?  ?  ? ?Permission Sought/Granted ?  ?Permission granted to share information with : Yes, Verbal Permission Granted ?   ? Permission granted to share info w AGENCY: SNFs ? Permission granted to share info w Relationship: Spouse ?   ? ?Emotional Assessment ?Appearance:: Appears stated age ?Attitude/Demeanor/Rapport: Engaged ?Affect (typically observed): Accepting ?Orientation: : Oriented to Self, Oriented to Place, Oriented to  Time, Oriented to Situation ?Alcohol / Substance Use: Not Applicable ?Psych Involvement: No (comment) ? ?Admission diagnosis:  AKI (acute kidney injury) ([place]) [N17.9] ?Patient Active Problem List  ? Diagnosis Date Noted  ? Acute renal failure superimposed on stage 3b chronic kidney disease ([place]) 10/19/2021  ? Oropharyngeal dysphagia 06/27/2021  ? Short gut syndrome   ? Syncope 04/22/2021  ?  Major depressive disorder 04/22/2021  ? Sinus node dysfunction ([place]) 04/22/2021  ? Atrial fibrillation with rapid ventricular response ([place]) 04/22/2021  ? AV block, 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  ? Orthostatic hypotension 02/20/2021  ? Iron deficiency anemia due to chronic blood loss 02/20/2021  ? Chronic diarrhea 01/18/2021  ? 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 GIB (gastrointestinal bleeding) 01/18/2021  ? Essential hypertension 01/18/2021  ? Hypokalemia due to excessive gastrointestinal loss of potassium 01/18/2021  ? Chronic kidney disease, stage 3a ([place]) 01/18/2021  ? ?PCP:  [name], MD ?Pharmacy:   ?[place] [address] ?[place] [address] ?Phone: [phone] Fax: [phone] ? ? ? ? ?Social Determinants of Health (SDOH) Interventions ?  ? ?Readmission Risk Interventions ?   ? View : No data to display.  ?  ?  ?  ? ? ? ?

## 2021-10-23 ENCOUNTER — Ambulatory Visit (INDEPENDENT_AMBULATORY_CARE_PROVIDER_SITE_OTHER): Payer: Medicare Other

## 2021-10-23 DIAGNOSIS — N179 Acute kidney failure, unspecified: Secondary | ICD-10-CM | POA: Diagnosis not present

## 2021-10-23 DIAGNOSIS — C7A019 Malignant carcinoid tumor of the small intestine, unspecified portion: Secondary | ICD-10-CM | POA: Diagnosis not present

## 2021-10-23 DIAGNOSIS — F329 Major depressive disorder, single episode, unspecified: Secondary | ICD-10-CM | POA: Diagnosis not present

## 2021-10-23 DIAGNOSIS — I495 Sick sinus syndrome: Secondary | ICD-10-CM

## 2021-10-23 DIAGNOSIS — I951 Orthostatic hypotension: Secondary | ICD-10-CM | POA: Diagnosis not present

## 2021-10-23 LAB — CUP PACEART REMOTE DEVICE CHECK
Battery Remaining Percentage: 100 %
Brady Statistic RA Percent Paced: 91 %
Brady Statistic RV Percent Paced: 4 %
Date Time Interrogation Session: 20230425090922
Implantable Lead Implant Date: 20221026
Implantable Lead Implant Date: 20221026
Implantable Lead Location: 753859
Implantable Lead Location: 753860
Implantable Lead Model: 377169
Implantable Lead Model: 377169
Implantable Lead Serial Number: 8000563684
Implantable Lead Serial Number: 8000606646
Implantable Pulse Generator Implant Date: 20221026
Lead Channel Impedance Value: 429 Ohm
Lead Channel Impedance Value: 527 Ohm
Lead Channel Pacing Threshold Amplitude: 1 V
Lead Channel Pacing Threshold Amplitude: 1.2 V
Lead Channel Pacing Threshold Pulse Width: 0.4 ms
Lead Channel Pacing Threshold Pulse Width: 0.5 ms
Lead Channel Sensing Intrinsic Amplitude: 1.1 mV
Lead Channel Sensing Intrinsic Amplitude: 2.4 mV
Lead Channel Setting Pacing Amplitude: 2 V
Lead Channel Setting Pacing Amplitude: 2.4 V
Lead Channel Setting Pacing Pulse Width: 0.4 ms
Pulse Gen Model: 407145
Pulse Gen Serial Number: 70247401

## 2021-10-23 MED ORDER — MIDODRINE HCL 10 MG PO TABS: 15.0000 mg | ORAL_TABLET | Freq: Three times a day (TID) | ORAL | 3 refills | Status: DC

## 2021-10-23 MED ORDER — MIRTAZAPINE 15 MG PO TABS: 15.0000 mg | ORAL_TABLET | Freq: Every day | ORAL | 0 refills | Status: DC

## 2021-10-23 MED ORDER — DRONABINOL 2.5 MG PO CAPS: 2.5000 mg | ORAL_CAPSULE | Freq: Two times a day (BID) | ORAL | 0 refills | Status: DC

## 2021-10-23 NOTE — TOC Progression Note (Addendum)
Transition of Care (TOC) - Initial/Assessment Note  ? ? ?Patient Details  ?Name: Alan [name] ?MRN: [id number] ?Date of Birth: 30-Dec-1940 ? ?Transition of Care (TOC) CM/SW Contact:    ?Paulene Floor Josalynn Johndrow, LCSWA ?Phone Number: ?10/23/2021, 10:32 AM ? ?Clinical Narrative:                 ?CSW checked on the status of insurance authorization and was informed that insurance Josem Kaufmann is still pending. ? ?1300-  Patient's insurance is requesting a peer to peer.   MD notified. ? ?Expected Discharge Plan: [place] ?Barriers to Discharge: Insurance Authorization ? ? ?Patient Goals and CMS Choice ?Patient states their goals for this hospitalization and ongoing recovery are:: To get home after rehab ?CMS Medicare.gov Compare Post Acute Care list provided to:: Patient ?Choice offered to / list presented to : Patient, Spouse ? ?Expected Discharge Plan and Services ?Expected Discharge Plan: [place] ?  ?  ?  ?Living arrangements for the past 2 months: [place] ?Expected Discharge Date: 10/23/21               ?  ?  ?  ?  ?  ?  ?  ?  ?  ?  ? ?Prior Living Arrangements/Services ?Living arrangements for the past 2 months: [place] ?Lives with:: Spouse, Self ?Patient language and need for interpreter reviewed:: Yes ?Do you feel safe going back to the place where you live?: Yes      ?Need for Family Participation in Patient Care: No (Comment) ?Care giver support system in place?: Yes (comment) ?  ?Criminal Activity/Legal Involvement Pertinent to Current Situation/Hospitalization: No - Comment as needed ? ?Activities of Daily Living ?  ?  ? ?Permission Sought/Granted ?  ?Permission granted to share information with : Yes, Verbal Permission Granted ?   ? Permission granted to share info w AGENCY: SNFs ? Permission granted to share info w Relationship: Spouse ?   ? ?Emotional Assessment ?Appearance:: Appears stated age ?Attitude/Demeanor/Rapport: Engaged ?Affect (typically observed):  Accepting ?Orientation: : Oriented to Self, Oriented to Place, Oriented to  Time, Oriented to Situation ?Alcohol / Substance Use: Not Applicable ?Psych Involvement: No (comment) ? ?Admission diagnosis:  AKI (acute kidney injury) ([place]) [N17.9] ?Patient Active Problem List  ? Diagnosis Date Noted  ? Acute renal failure superimposed on stage 3b chronic kidney disease ([place]) 10/19/2021  ? Oropharyngeal dysphagia 06/27/2021  ? Short gut syndrome   ? Syncope 04/22/2021  ? Major depressive disorder 04/22/2021  ? Sinus node dysfunction ([place]) 04/22/2021  ? Atrial fibrillation with rapid ventricular response ([place]) 04/22/2021  ? AV block, 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  ? Orthostatic hypotension 02/20/2021  ? Iron deficiency anemia due to chronic blood loss 02/20/2021  ? Chronic diarrhea 01/18/2021  ? 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 GIB (gastrointestinal bleeding) 01/18/2021  ? Essential hypertension 01/18/2021  ? Hypokalemia due to excessive gastrointestinal loss of potassium 01/18/2021  ? Chronic kidney disease, stage 3a ([place]) 01/18/2021  ? ?PCP:  [name], MD ?Pharmacy:   ?[place] [place] [address] ?[place] [address] ?Phone: [phone] Fax: [phone] ? ? ? ? ?Social Determinants of Health (SDOH) Interventions ?  ? ?Readmission Risk Interventions ?   ? View : No data to display.  ?  ?  ?  ? ? ? ?

## 2021-10-23 NOTE — Plan of Care (Signed)
Was advised to do a peer to peer review with insurance company physician.  Case discussed with Dr. Verlon Setting. ?According to insurance guidelines, patient was not able to participate in physical therapy due to syncopal episodes hence skilled nursing facility was denied.   ?His orthostatics have improved now with intervention including increase dose of midodrine and abdominal binder, requested reevaluation by physical therapy and resubmission for insurance prior Auth by 4:30 PM today.  Case management updated. ?

## 2021-10-23 NOTE — Progress Notes (Signed)
Occupational Therapy Treatment ?Patient Details ?Name: Alan [name] ?MRN: [id number] ?DOB: 11-07-1940 ?Today's Date: 10/23/2021 ? ? ?History of present illness Pt is a 81 y.o. M who presents with orthostatic hypotension and recurrent syncopal episodes 10/19/2021. Significant PMH: malignant carcinoid tumor, short-bowel syndrome, chronic diarrhea, CKD stage IIIb, depression and anxiety, severe orthostatic hypotension ?  ?OT comments ? Pt progressing towards OT goals this session. Pt able to tolerate transfer to recliner and demonstrate HEP with theraband. Pt able to demonstrate excellent activity tolerance and motivation for participation in post-acute rehab (please see HEP below for UB and LB exercises). Pt verbally acknowledges and understands the exercises presented and agree to perform them at least 3x day. POC remains appropriate at this time. OT will continue to follow acutely.   ? ?Recommendations for follow up therapy are one component of a multi-disciplinary discharge planning process, led by the attending physician.  Recommendations may be updated based on patient status, additional functional criteria and insurance authorization. ?   ?Follow Up Recommendations ? Skilled nursing-short term rehab (<3 hours/day)  ?  ?Assistance Recommended at Discharge Intermittent Supervision/Assistance  ?Patient can return home with the following ? A little help with bathing/dressing/bathroom;Assistance with cooking/housework;Help with stairs or ramp for entrance ?  ?Equipment Recommendations ? None recommended by OT  ?  ?Recommendations for Other Services PT consult ? ?  ?Precautions / Restrictions Precautions ?Precautions: Fall;Other (comment) ?Precaution Comments: watch BP ?Restrictions ?Weight Bearing Restrictions: No  ? ? ?  ? ?Mobility Bed Mobility ?Overal bed mobility: Needs Assistance ?Bed Mobility: Supine to Sit ?  ?  ?Supine to sit: Min guard ?  ?  ?  ?  ? ?Transfers ?Overall transfer level: Needs  assistance ?Equipment used: None ?Transfers: Sit to/from Stand ?Sit to Stand: Min guard ?  ?  ?  ?  ?  ?General transfer comment: min guard for safety ?  ?  ?Balance Overall balance assessment: Needs assistance ?Sitting-balance support: Feet supported ?Sitting balance-Leahy Scale: Fair ?  ?  ?Standing balance support: No upper extremity supported, During functional activity ?Standing balance-Leahy Scale: Poor ?Standing balance comment: reaching out for external support, minA for balance ?  ?  ?  ?  ?  ?  ?  ?  ?  ?  ?  ?   ? ?ADL either performed or assessed with clinical judgement  ? ?ADL Overall ADL's : Needs assistance/impaired ?  ?  ?  ?  ?  ?  ?  ?  ?  ?  ?  ?  ?Toilet Transfer: Min guard;Stand-pivot ?Toilet Transfer Details (indicate cue type and reason): simulated ?  ?  ?  ?  ?  ?  ?  ? ?Extremity/Trunk Assessment Upper Extremity Assessment ?Upper Extremity Assessment: Generalized weakness ?  ?Lower Extremity Assessment ?Lower Extremity Assessment: Defer to PT evaluation ?  ?  ?  ? ?Vision   ?  ?  ?Perception   ?  ?Praxis   ?  ? ?Cognition Arousal/Alertness: Awake/alert ?Behavior During Therapy: [facility] for tasks assessed/performed ?Overall Cognitive Status: Within Functional Limits for tasks assessed ?  ?  ?  ?  ?  ?  ?  ?  ?  ?  ?  ?  ?  ?  ?  ?  ?  ?  ?  ?   ?Exercises Exercises: General Upper Extremity, General Lower Extremity ?General Exercises - Upper Extremity ?Shoulder Flexion: AROM, Strengthening, Both, 10 reps, Seated, Theraband ?Theraband Level (Shoulder Flexion): Level 2 (Red) ?  Shoulder ABduction: AROM, Strengthening, Both, 10 reps, Seated, Theraband ?Theraband Level (Shoulder Abduction): Level 2 (Red) ?Shoulder Horizontal ABduction: AROM, Strengthening, Both, 10 reps, Seated, Theraband ?Theraband Level (Shoulder Horizontal Abduction): Level 2 (Red) ?Shoulder Horizontal ADduction: AROM, Strengthening, Both, 10 reps, Seated, Theraband ?Theraband Level (Shoulder Horizontal Adduction): Level 2  (Red) ?Elbow Flexion: AROM, Strengthening, Both, 10 reps, Seated ?Chair Push Up: AROM, Strengthening ?General Exercises - Lower Extremity ?Ankle Circles/Pumps: AROM, Both, 10 reps, Seated ? ?  ?Shoulder Instructions   ? ? ?  ?General Comments    ? ? ?Pertinent Vitals/ Pain       Pain Assessment ?Pain Assessment: No/denies pain ?Pain Intervention(s): Monitored during session ? ?Home Living   ?  ?  ?  ?  ?  ?  ?  ?  ?  ?  ?  ?  ?  ?  ?  ?  ?  ?  ? ?  ?Prior Functioning/Environment    ?  ?  ?  ?   ? ?Frequency ? Min 2X/week  ? ? ? ? ?  ?Progress Toward Goals ? ?OT Goals(current goals can now be found in the care plan section) ? Progress towards OT goals: Progressing toward goals ? ?Acute Rehab OT Goals ?Patient Stated Goal: get stronger and more independent ?OT Goal Formulation: With patient/family ?Time For Goal Achievement: 11/04/21 ?Potential to Achieve Goals: Good  ?Plan Discharge plan remains appropriate;Frequency remains appropriate   ? ?Co-evaluation ? ? ?   ?  ?  ?  ?  ? ?  ?AM-PAC OT "6 Clicks" Daily Activity     ?Outcome Measure ? ? Help from another person eating meals?: None ?Help from another person taking care of personal grooming?: A Little ?Help from another person toileting, which includes using toliet, bedpan, or urinal?: A Little ?Help from another person bathing (including washing, rinsing, drying)?: A Little ?Help from another person to put on and taking off regular upper body clothing?: A Little ?Help from another person to put on and taking off regular lower body clothing?: A Little ?6 Click Score: 19 ? ?  ?End of Session Equipment Utilized During Treatment: Gait belt ? ?OT Visit Diagnosis: Unsteadiness on feet (R26.81);History of falling (Z91.81);Muscle weakness (generalized) (M62.81) ?  ?Activity Tolerance Patient tolerated treatment well ?  ?Patient Left in chair;with call bell/phone within reach;with family/visitor present;with chair alarm set ?  ?Nurse Communication Mobility status ?  ? ?    ? ?Time: 1350-1409 ?OT Time Calculation (min): 19 min ? ?Charges: OT General Charges ?$OT Visit: 1 Visit ?OT Treatments ?$Therapeutic Exercise: 8-22 mins ? ?Jesse Sans OTR/L ?Acute Rehabilitation Services ?Pager: [phone] ?Office: [phone] ? ?Merri Ray Paxon Propes ?10/23/2021, 2:10 PM ?

## 2021-10-23 NOTE — Plan of Care (Signed)
  Problem: Education: Goal: Knowledge of General Education information will improve Description Including pain rating scale, medication(s)/side effects and non-pharmacologic comfort measures Outcome: Progressing   

## 2021-10-23 NOTE — Discharge Summary (Addendum)
Physician Discharge Summary  ?Alan [name] KCM:[id number] DOB: October 14, 1940 DOA: 10/19/2021 ? ?PCP: [name], MD ? ?Admit date: 10/19/2021 ?Discharge date: 10/24/2021 ? ?Admitted From: Home ?Disposition: Skilled nursing facility ? ?Recommendations for Outpatient Follow-up:  ?Follow up with PCP in 1-2 weeks ?Please obtain BMP/CBC/magnesium/phosphorus in one week ?Extreme orthostatic precautions.  Extreme fall precautions. ? ?Home Health: N/A ?Equipment/Devices: N/A ? ?Discharge Condition: Stable ?CODE STATUS: Full code ?Diet recommendation: Regular diet, nutritional supplements ? ?Addendum 4/28: Discharge summary reviewed.  No changes needed. ? ?Discharge summary: ?81 year old gentleman with history of malignant carcinoid tumor, short-bowel syndrome, chronic diarrhea, CKD stage IIIb, depression and anxiety, severe orthostatic hypotension presented to the emergency room with lightheadedness on standing and recurrent syncopal episodes.  Has a long history of orthostatic hypotension and currently on fludrocortisone and midodrine.  He had symptoms for last 3 days with multiple syncopal episodes.  In the ER hemodynamically stable.  Creatinine 2.87 slightly worse from baseline.  Blood pressure dropped from 056-97 systolic.  Given IV fluids and admitted with significant symptoms. ?  ?  ?Assessment & Plan: ?  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 ?Patient was treated with IV fluids, continued on fludrocortisone and midodrine at home doses.  Slight improvement of orthostatic symptoms. ?4/26, despite all the efforts syncopal episode while working with physical therapy.  Blood pressure 116/85-66/48 from sitting to standing. ?Plan: ?Continue fludrocortisone 0.1 mg daily ?Increase midodrine 15 mg 3 times daily ?Compression stockings, thigh-high all the time possible ?Abdominal binder before mobilizing. ?Extreme orthostatic  precautions. ?Accept supine hypertension to make enough room to drop systolic blood pressures. ?Follow-up with cardiology. ?  ?Acute kidney injury superimposed on CKD stage IIIb: ?Presentation creatinine 2.87, recent creatinine 2.09.  Likely prerenal in the setting of poor appetite and chronic diarrhea with weight loss.  Clinically improving so encourage oral intake.  Creatinine 2.18. ?  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  No more diarrhea. ?  ?Paroxysmal A-fib: Currently sinus rhythm.  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  On aspirin for anticoagulation.  Heart rate is controlled. ?  ?Depression and anxiety: On Remeron.  Increase dose to Remeron 15 mg at night.  Stable. ?  ?Medically stable to transfer to a skilled level of care. ? ? ?Discharge Diagnoses:  ?Principal Problem: ?  Acute renal failure superimposed on stage 3b chronic kidney disease ([place]) ?Active Problems: ?  Major depressive disorder ?  Chronic diarrhea ?  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[place]) ?  Orthostatic hypotension ?  Syncope ? ? ? ?Discharge Instructions ? ?Discharge Instructions   ? ? Diet general   Complete by: As directed ?  ? Discharge instructions   Complete by: As directed ?  ? Compression stockings as much as possible. ?Abdominal binder before attempting to walk ?Extreme orthostatic precautions , all time fall precautions  ? Increase activity slowly   Complete by: As directed ?  ? ?  ? ?Allergies as of 10/24/2021   ? ?   Reactions  ? Penicillins Hives, Shortness Of Breath  ? Lisinopril Itching  ? ?  ? ?  ?Medication List  ?  ? ?STOP taking these medications   ? ?ALEVE PO ?  ?potassium chloride SA 20 MEQ tablet ?Commonly known as: KLOR-CON M ?  ? ?  ? ?TAKE these medications   ? ?albuterol 108 (90 Base) MCG/ACT inhaler ?Commonly  known as: VENTOLIN HFA ?Inhale 2 puffs into the lungs every 6 (six) hours as needed for wheezing or shortness of breath. ?What  changed:  ?when to take this ?reasons to take this ?  ?aspirin 81 MG EC tablet ?Take 81 mg by mouth daily. Swallow whole. ?  ?dronabinol 2.5 MG capsule ?Commonly known as: MARINOL ?Take 1 capsule (2.5 mg total) by mouth 2 (two) times daily before a meal. ?  ?feeding supplement (ENSURE COMPLETE) Liqd ?Take 237 mLs by mouth 2 (two) times daily between meals. ?  ?fludrocortisone 0.1 MG tablet ?Commonly known as: FLORINEF ?Take 1 tablet (0.1 mg total) by mouth daily. ?  ?iron polysaccharides 150 MG capsule ?Commonly known as: NIFEREX ?TAKE 1 CAPSULE(150 MG) BY MOUTH 3 TIMES A WEEK ?What changed: See the new instructions. ?  ?LANREOTIDE ACETATE [place] ?Inject 1 Dose into the skin every 30 (thirty) days. ?  ?midodrine 10 MG tablet ?Commonly known as: PROAMATINE ?Take 1.5 tablets (15 mg total) by mouth 3 (three) times daily. ?What changed: how much to take ?  ?mirtazapine 15 MG tablet ?Commonly known as: REMERON ?Take 1 tablet (15 mg total) by mouth daily. ?What changed:  ?medication strength ?how much to take ?  ?multivitamin Tabs tablet ?Take 1 tablet by mouth daily. ?  ?PRESERVISION AREDS 2 PO ?Take 1 capsule by mouth daily. ?  ?Symbicort 160-4.5 MCG/ACT inhaler ?Generic drug: budesonide-formoterol ?Inhale 2 puffs into the lungs daily as needed (wheezing). ?  ? ?  ? ? ?Allergies  ?Allergen Reactions  ? Penicillins Hives and Shortness Of Breath  ? Lisinopril Itching  ? ? ?Consultations: ?None ? ? ?Procedures/Studies: ?NM Radiologist Eval And Mgmt ? ?Result Date: 09/25/2021 ?EXAM: NEW PATIENT OFFICE VISIT CHIEF COMPLAINT: Metastatic well differentiated neuroendocrine tumor. 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 Original diagnosis in 2011 with partial small bowel obstruction and subsequent small-bowel resection revealing neuroendocrine tumor. Subsequent liver metastasis identified in 2011. Patient initially started a short trial of octreotide  injections. Patient terminated octreotide injections initially for adverse effects. More recently, patient has Re -initiated octreotide injections with lanreotide injection every month for 1 year time. Patient has well differentiated neuroendocrine tumor metastasis identified on DOTATATE PET scan 08/07/2021. Evidence of progression on the DOTATATE PET scan with disease in the liver and peritoneum. Patient continues to have significant diarrhea. Patient has chromogranin levels which are significantly elevated at 1600. Patient is significant renal insufficiency.  (Creatinine equal 2.0.) REVIEW OF SYSTEMS: See epic note PHYSICAL EXAMINATION: Deferred for COVID. ASSESSMENT AND PLAN: Patient is a candidate for peptide receptor radiotherapy with some reservation as to renal insufficieny. Patient has well differentiated neuroendocrine tumor identified within the liver and peritoneum by DOTATATE PET scan. Patient has moderate symptoms which may be attributable to carcinoid tumor including diarrhea and weight loss. Significantly elevated chromogranin. The patient was counseled on the primary goal of therapy which is prolongation of progression free survival (79% improvement over standard therapy). Secondary goals would include decrease in tumor burden and decrease in carcinoid symptoms. Primary of toxicities of therapy were explained to patient including marrow suppression, renal toxicity and hepatic toxicity. Rare toxicity of myelosuppression and leukemia also explained. Potential toxicity will be monitored throughout the course of therapy with interval CBC and CMP laboratory evaluation. Four therapies will be scheduled 2 months apart over a six-month interval. Patient will receive IM Sandostatin injection in the molecular imaging department after each therapy. Patient will return to  oncology clinic 1 month following each therapy for CBC and CMP and IM Sandostatin injection. Due to patient's known renal insufficiency,  patient will receive half dose (100 mCi Lu 177 DOTATATE) as initial therapy with hopes to advance to full dose on subsequent therapies, as tolerated. If increased renal toxicity is encountered, therapy may be halte

## 2021-10-24 ENCOUNTER — Ambulatory Visit: Payer: Medicare Other | Admitting: Cardiology

## 2021-10-24 DIAGNOSIS — R1312 Dysphagia, oropharyngeal phase: Secondary | ICD-10-CM | POA: Diagnosis not present

## 2021-10-24 DIAGNOSIS — I129 Hypertensive chronic kidney disease with stage 1 through stage 4 chronic kidney disease, or unspecified chronic kidney disease: Secondary | ICD-10-CM | POA: Diagnosis not present

## 2021-10-24 DIAGNOSIS — M2550 Pain in unspecified joint: Secondary | ICD-10-CM | POA: Diagnosis not present

## 2021-10-24 DIAGNOSIS — D509 Iron deficiency anemia, unspecified: Secondary | ICD-10-CM | POA: Diagnosis not present

## 2021-10-24 DIAGNOSIS — E119 Type 2 diabetes mellitus without complications: Secondary | ICD-10-CM | POA: Diagnosis not present

## 2021-10-24 DIAGNOSIS — I951 Orthostatic hypotension: Secondary | ICD-10-CM | POA: Diagnosis not present

## 2021-10-24 DIAGNOSIS — R040 Epistaxis: Secondary | ICD-10-CM | POA: Diagnosis not present

## 2021-10-24 DIAGNOSIS — N179 Acute kidney failure, unspecified: Secondary | ICD-10-CM | POA: Diagnosis not present

## 2021-10-24 DIAGNOSIS — K529 Noninfective gastroenteritis and colitis, unspecified: Secondary | ICD-10-CM | POA: Diagnosis not present

## 2021-10-24 DIAGNOSIS — I1310 Hypertensive heart and chronic kidney disease without heart failure, with stage 1 through stage 4 chronic kidney disease, or unspecified chronic kidney disease: Secondary | ICD-10-CM | POA: Diagnosis not present

## 2021-10-24 DIAGNOSIS — I480 Paroxysmal atrial fibrillation: Secondary | ICD-10-CM | POA: Diagnosis not present

## 2021-10-24 DIAGNOSIS — N1832 Chronic kidney disease, stage 3b: Secondary | ICD-10-CM | POA: Diagnosis not present

## 2021-10-24 DIAGNOSIS — Z87891 Personal history of nicotine dependence: Secondary | ICD-10-CM | POA: Diagnosis not present

## 2021-10-24 DIAGNOSIS — R55 Syncope and collapse: Secondary | ICD-10-CM | POA: Diagnosis not present

## 2021-10-24 DIAGNOSIS — N184 Chronic kidney disease, stage 4 (severe): Secondary | ICD-10-CM | POA: Diagnosis not present

## 2021-10-24 DIAGNOSIS — R1310 Dysphagia, unspecified: Secondary | ICD-10-CM | POA: Diagnosis not present

## 2021-10-24 DIAGNOSIS — R2689 Other abnormalities of gait and mobility: Secondary | ICD-10-CM | POA: Diagnosis not present

## 2021-10-24 DIAGNOSIS — I495 Sick sinus syndrome: Secondary | ICD-10-CM | POA: Diagnosis not present

## 2021-10-24 DIAGNOSIS — W19XXXA Unspecified fall, initial encounter: Secondary | ICD-10-CM | POA: Diagnosis not present

## 2021-10-24 DIAGNOSIS — I10 Essential (primary) hypertension: Secondary | ICD-10-CM | POA: Diagnosis not present

## 2021-10-24 DIAGNOSIS — Z79899 Other long term (current) drug therapy: Secondary | ICD-10-CM | POA: Diagnosis not present

## 2021-10-24 DIAGNOSIS — D3A098 Benign carcinoid tumors of other sites: Secondary | ICD-10-CM | POA: Diagnosis not present

## 2021-10-24 DIAGNOSIS — H3530 Unspecified macular degeneration: Secondary | ICD-10-CM | POA: Diagnosis not present

## 2021-10-24 DIAGNOSIS — E46 Unspecified protein-calorie malnutrition: Secondary | ICD-10-CM | POA: Diagnosis not present

## 2021-10-24 DIAGNOSIS — R634 Abnormal weight loss: Secondary | ICD-10-CM | POA: Diagnosis not present

## 2021-10-24 DIAGNOSIS — Q6102 Congenital multiple renal cysts: Secondary | ICD-10-CM | POA: Diagnosis not present

## 2021-10-24 DIAGNOSIS — D500 Iron deficiency anemia secondary to blood loss (chronic): Secondary | ICD-10-CM | POA: Diagnosis not present

## 2021-10-24 DIAGNOSIS — J45909 Unspecified asthma, uncomplicated: Secondary | ICD-10-CM | POA: Diagnosis not present

## 2021-10-24 DIAGNOSIS — R2681 Unsteadiness on feet: Secondary | ICD-10-CM | POA: Diagnosis not present

## 2021-10-24 DIAGNOSIS — K912 Postsurgical malabsorption, not elsewhere classified: Secondary | ICD-10-CM | POA: Diagnosis not present

## 2021-10-24 DIAGNOSIS — C7A00 Malignant carcinoid tumor of unspecified site: Secondary | ICD-10-CM | POA: Diagnosis not present

## 2021-10-24 DIAGNOSIS — E559 Vitamin D deficiency, unspecified: Secondary | ICD-10-CM | POA: Diagnosis not present

## 2021-10-24 DIAGNOSIS — K909 Intestinal malabsorption, unspecified: Secondary | ICD-10-CM | POA: Diagnosis not present

## 2021-10-24 DIAGNOSIS — C7A019 Malignant carcinoid tumor of the small intestine, unspecified portion: Secondary | ICD-10-CM | POA: Diagnosis not present

## 2021-10-24 DIAGNOSIS — Z9181 History of falling: Secondary | ICD-10-CM | POA: Diagnosis not present

## 2021-10-24 DIAGNOSIS — E876 Hypokalemia: Secondary | ICD-10-CM | POA: Diagnosis not present

## 2021-10-24 DIAGNOSIS — Z7401 Bed confinement status: Secondary | ICD-10-CM | POA: Diagnosis not present

## 2021-10-24 DIAGNOSIS — M6281 Muscle weakness (generalized): Secondary | ICD-10-CM | POA: Diagnosis not present

## 2021-10-24 DIAGNOSIS — J449 Chronic obstructive pulmonary disease, unspecified: Secondary | ICD-10-CM | POA: Diagnosis not present

## 2021-10-24 DIAGNOSIS — Z950 Presence of cardiac pacemaker: Secondary | ICD-10-CM | POA: Diagnosis not present

## 2021-10-24 DIAGNOSIS — I4891 Unspecified atrial fibrillation: Secondary | ICD-10-CM | POA: Diagnosis not present

## 2021-10-24 NOTE — Progress Notes (Addendum)
Gracey to give report talked to Elliott. ?

## 2021-10-24 NOTE — Progress Notes (Signed)
Patient seen and examined.  Could not be discharged to a skilled nursing facility yesterday, insurance prior authorization pending.  Peer to peer review done with insurance physician.  Repeat therapies submitted. ? ?Today.  He was able to work with PT.  His blood pressure still drops but he was less symptomatic. ?He has exhausted maximizing therapy for postural hypotension.  Hopefully the new interventions will help. ? ?Discharge summary is reviewed and no changes needed. ?All-time fall precautions, orthostatic precautions, thigh-high compression stockings and abdominal binder. ? ?He can be discharged to skilled nursing facility today. ? ?Total time spent: 25 minutes. ?

## 2021-10-24 NOTE — Progress Notes (Signed)
DISCHARGE NOTE SNF ?Diaz Crago Purkey to be discharged [facility] per MD order. Patient verbalized understanding. ? ?Skin clean, dry and intact without evidence of skin break down, no evidence of skin tears noted. IV catheter discontinued intact. Site without signs and symptoms of complications. Dressing and pressure applied. Pt denies pain at the site currently. No complaints noted. ? ?Patient free of lines, drains, and wounds.  ? ?Discharge packet assembled. An After Visit Summary (AVS) was printed and given to the EMS personnel. Patient escorted via stretcher and discharged to [facility] via ambulance. Report called to accepting facility; all questions and concerns addressed.  ? ?Berneta Levins, RN  ?

## 2021-10-24 NOTE — TOC Transition Note (Signed)
Transition of Care (TOC) - CM/SW Discharge Note ? ? ?Patient Details  ?Name: Alan [name] ?MRN: [id number] ?Date of Birth: 1941/01/09 ? ?Transition of Care (TOC) CM/SW Contact:  ?[place], LCSW ?Phone Number: ?10/24/2021, 11:31 AM ? ? ?Clinical Narrative:    ? ?SNF auth approved 10/22/2021-10/26/2021 Auth#: [id number] ? ?Patient will DC to: [facility] ?Anticipated DC date: 10/24/21 ?Family notified: Ehrler,[name] (Spouse)  ?[phone] ([place]) ?Transport by: Corey Harold ? ? ?Per MD patient ready for DC to [facility]. RN, patient, patient's family, and facility notified of DC. Discharge Summary and FL2 sent to facility. RN to call report prior to discharge [phone] Room 112). DC packet on chart. Ambulance transport requested for patient.  ? ?CSW will sign off for now as social work intervention is no longer needed. Please consult [place] again if new needs arise.  ? ?Final next level of care: [place] ?Barriers to Discharge: No Barriers Identified ? ? ?Patient Goals and CMS Choice ?Patient states their goals for this hospitalization and ongoing recovery are:: To get home after rehab ?CMS Medicare.gov Compare Post Acute Care list provided to:: Patient ?Choice offered to / list presented to : Patient, Spouse ? ?Discharge Placement ?  ?           ?Patient [facility]: [facility] ?Patient to be transferred to facility by: [facility] ?Name of family member notified: Alamo,[name] (Spouse)   [phone] ([place]) ?Patient and family notified of of transfer: 10/24/21 ? ?Discharge Plan and Services ?  ?  ?           ?  ?  ?  ?  ?  ?  ?  ?  ?  ?  ? ?Social Determinants of Health (SDOH) Interventions ?  ? ? ?Readmission Risk Interventions ?   ? View : No data to display.  ?  ?  ?  ? ? ? ? ? ?

## 2021-10-24 NOTE — Progress Notes (Signed)
Physical Therapy Treatment ?Patient Details ?Name: Alan [name] ?MRN: [id number] ?DOB: 1940/06/30 ?Today's Date: 10/24/2021 ? ? ?History of Present Illness Pt is a 81 y.o. M who presents with orthostatic hypotension and recurrent syncopal episodes 10/19/2021. Significant PMH: malignant carcinoid tumor, short-bowel syndrome, chronic diarrhea, CKD stage IIIb, depression and anxiety, severe orthostatic hypotension ? ?  ?PT Comments  ? ? Patient seen today to reassess orthostatics with addition of abdominal binder as well as TED hose. Patient continues to experience drop in BP but less symptoms. Patient reports mild dizziness but still able to participate with therapy this session without syncopal episode. Instructed patient on counterpressure techniques and provided handout. See below for values. Continue to recommend SNF for ongoing Physical Therapy.    ? ?Orthostatic BPs ? ?Sitting 142/89  ?Standing 90/67  ?Standing after 3 min + counterpressure techniques 89/66  ?After ambulated 8'  86/52  ?After ambulated +20'  77/56  ?Sitting 97/52  ?Sitting after 2 minutes 129/88  ?  ?   ?Recommendations for follow up therapy are one component of a multi-disciplinary discharge planning process, led by the attending physician.  Recommendations may be updated based on patient status, additional functional criteria and insurance authorization. ? ?Follow Up Recommendations ? Skilled nursing-short term rehab (<3 hours/day) ?  ?  ?Assistance Recommended at Discharge Frequent or constant Supervision/Assistance  ?Patient can return home with the following A little help with walking and/or transfers;A little help with bathing/dressing/bathroom;Assistance with cooking/housework;Assist for transportation;Help with stairs or ramp for entrance ?  ?Equipment Recommendations ? None recommended by PT  ?  ?Recommendations for Other Services   ? ? ?  ?Precautions / Restrictions Precautions ?Precautions: Fall;Other (comment) ?Precaution Comments:  watch BP ?Restrictions ?Weight Bearing Restrictions: No  ?  ? ?Mobility ? Bed Mobility ?  ?  ?  ?  ?  ?  ?  ?General bed mobility comments: in recliner on arrival ?  ? ?Transfers ?Overall transfer level: Needs assistance ?Equipment used: None ?Transfers: Sit to/from Stand ?Sit to Stand: Min guard ?  ?  ?  ?  ?  ?General transfer comment: min guard for safety ?  ? ?Ambulation/Gait ?Ambulation/Gait assistance: Min guard ?Gait Distance (Feet): 8 Feet (+20') ?Assistive device: 1 person hand held assist ?Gait Pattern/deviations: Step-through pattern, Decreased stride length ?Gait velocity: decreased ?  ?  ?General Gait Details: min guard for safety with HHA ? ? ?Stairs ?  ?  ?  ?  ?  ? ? ?Wheelchair Mobility ?  ? ?Modified Rankin (Stroke Patients Only) ?  ? ? ?  ?Balance Overall balance assessment: Needs assistance ?Sitting-balance support: Feet supported ?Sitting balance-Leahy Scale: Fair ?  ?  ?Standing balance support: No upper extremity supported, During functional activity ?Standing balance-Leahy Scale: Poor ?  ?  ?  ?  ?  ?  ?  ?  ?  ?  ?  ?  ?  ? ?  ?Cognition Arousal/Alertness: Awake/alert ?Behavior During Therapy: [facility] for tasks assessed/performed ?Overall Cognitive Status: Within Functional Limits for tasks assessed ?  ?  ?  ?  ?  ?  ?  ?  ?  ?  ?  ?  ?  ?  ?  ?  ?  ?  ?  ? ?  ?Exercises Other Exercises ?Other Exercises: Instructed patient on counter pressure techniques and provided handout ? ?  ?General Comments   ?  ?  ? ?Pertinent Vitals/Pain Pain Assessment ?Pain Assessment: No/denies pain  ? ? ?  Home Living   ?  ?  ?  ?  ?  ?  ?  ?  ?  ?   ?  ?Prior Function    ?  ?  ?   ? ?PT Goals (current goals can now be found in the care plan section) Acute Rehab PT Goals ?Patient Stated Goal: get stronger ?PT Goal Formulation: With patient/family ?Time For Goal Achievement: 11/03/21 ?Potential to Achieve Goals: Good ?Progress towards PT goals: Progressing toward goals ? ?  ?Frequency ? ? ? Min 3X/week ? ? ? ?  ?PT Plan  Current plan remains appropriate  ? ? ?Co-evaluation   ?  ?  ?  ?  ? ?  ?AM-PAC PT "6 Clicks" Mobility   ?Outcome Measure ? Help needed turning from your back to your side while in a flat bed without using bedrails?: A Little ?Help needed moving from lying on your back to sitting on the side of a flat bed without using bedrails?: A Little ?Help needed moving to and from a bed to a chair (including a wheelchair)?: A Little ?Help needed standing up from a chair using your arms (e.g., wheelchair or bedside chair)?: A Little ?Help needed to walk in hospital room?: A Lot ?Help needed climbing 3-5 steps with a railing? : Total ?6 Click Score: 15 ? ?  ?End of Session Equipment Utilized During Treatment: Gait belt;Other (comment) (TED hose, abdominal binder) ?Activity Tolerance: Patient tolerated treatment well ?Patient left: in chair;with call bell/phone within reach;with chair alarm set ?Nurse Communication: Mobility status;Other (comment) (orthostatics) ?PT Visit Diagnosis: Unsteadiness on feet (R26.81);Other abnormalities of gait and mobility (R26.89);Repeated falls (R29.6);Muscle weakness (generalized) (M62.81);Difficulty in walking, not elsewhere classified (R26.2) ?  ? ? ?Time: 1026-1050 ?PT Time Calculation (min) (ACUTE ONLY): 24 min ? ?Charges:  $Gait Training: 8-22 mins ?$Therapeutic Activity: 8-22 mins          ?          ? ?Alan [name], PT, DPT ?Acute Rehabilitation Services ?Pager [phone] ?Office [phone] ? ? ? ?Alan [name] ?10/24/2021, 11:01 AM ? ?

## 2021-10-24 NOTE — Plan of Care (Signed)
  Problem: Health Behavior/Discharge Planning: Goal: Ability to manage health-related needs will improve Outcome: Progressing   Problem: Activity: Goal: Risk for activity intolerance will decrease Outcome: Progressing   

## 2021-10-27 DIAGNOSIS — I129 Hypertensive chronic kidney disease with stage 1 through stage 4 chronic kidney disease, or unspecified chronic kidney disease: Secondary | ICD-10-CM | POA: Diagnosis not present

## 2021-10-27 DIAGNOSIS — H3530 Unspecified macular degeneration: Secondary | ICD-10-CM | POA: Diagnosis not present

## 2021-10-27 DIAGNOSIS — N179 Acute kidney failure, unspecified: Secondary | ICD-10-CM | POA: Diagnosis not present

## 2021-10-27 DIAGNOSIS — C7A00 Malignant carcinoid tumor of unspecified site: Secondary | ICD-10-CM | POA: Diagnosis not present

## 2021-10-27 DIAGNOSIS — N1832 Chronic kidney disease, stage 3b: Secondary | ICD-10-CM | POA: Diagnosis not present

## 2021-10-27 DIAGNOSIS — R1310 Dysphagia, unspecified: Secondary | ICD-10-CM | POA: Diagnosis not present

## 2021-10-27 DIAGNOSIS — I951 Orthostatic hypotension: Secondary | ICD-10-CM | POA: Diagnosis not present

## 2021-10-27 DIAGNOSIS — R2681 Unsteadiness on feet: Secondary | ICD-10-CM | POA: Diagnosis not present

## 2021-10-27 DIAGNOSIS — R55 Syncope and collapse: Secondary | ICD-10-CM | POA: Diagnosis not present

## 2021-10-28 DIAGNOSIS — I495 Sick sinus syndrome: Secondary | ICD-10-CM | POA: Diagnosis not present

## 2021-10-28 DIAGNOSIS — I10 Essential (primary) hypertension: Secondary | ICD-10-CM | POA: Diagnosis not present

## 2021-10-28 DIAGNOSIS — Z950 Presence of cardiac pacemaker: Secondary | ICD-10-CM | POA: Diagnosis not present

## 2021-10-28 DIAGNOSIS — E119 Type 2 diabetes mellitus without complications: Secondary | ICD-10-CM | POA: Diagnosis not present

## 2021-10-28 DIAGNOSIS — E559 Vitamin D deficiency, unspecified: Secondary | ICD-10-CM | POA: Diagnosis not present

## 2021-10-28 DIAGNOSIS — C7A00 Malignant carcinoid tumor of unspecified site: Secondary | ICD-10-CM | POA: Diagnosis not present

## 2021-10-28 DIAGNOSIS — I1310 Hypertensive heart and chronic kidney disease without heart failure, with stage 1 through stage 4 chronic kidney disease, or unspecified chronic kidney disease: Secondary | ICD-10-CM | POA: Diagnosis not present

## 2021-10-29 ENCOUNTER — Other Ambulatory Visit: Payer: Self-pay | Admitting: *Deleted

## 2021-10-29 DIAGNOSIS — R55 Syncope and collapse: Secondary | ICD-10-CM | POA: Diagnosis not present

## 2021-10-29 DIAGNOSIS — N179 Acute kidney failure, unspecified: Secondary | ICD-10-CM | POA: Diagnosis not present

## 2021-10-29 DIAGNOSIS — I129 Hypertensive chronic kidney disease with stage 1 through stage 4 chronic kidney disease, or unspecified chronic kidney disease: Secondary | ICD-10-CM | POA: Diagnosis not present

## 2021-10-29 DIAGNOSIS — N1832 Chronic kidney disease, stage 3b: Secondary | ICD-10-CM | POA: Diagnosis not present

## 2021-10-29 DIAGNOSIS — R2681 Unsteadiness on feet: Secondary | ICD-10-CM | POA: Diagnosis not present

## 2021-10-29 NOTE — Patient Outreach (Signed)
Per [place] eligible member currently resides in [facility].  Screening for potential [facility] care coordination services as a benefit of [employer] plan. ? ?Member's PCP at [facility] has [place] care coordination team. ? ?Alan [name] admitted to SNF on 10/24/21 after hospitalization. ? ?Facility site visit to [facility] skilled nursing facility. Met with Alan [name], SNF SW concerning member's transition plan and potential [facility] needs. Anticipated transition plan is to return home with spouse. Care plan meeting scheduled for this Friday. Discharge date not known yet.  ? ?Spoke with Alan [name] and spouse in room at [facility] to discuss transition plans and [facility] follow up. Alan [name] reports member is already active with [facility] Embedded care coordination team. States "the nurse from the doctor's office already calls [place]." Alan [name] is currently active with [facility] CCM services at PCP office. ? ?Alan [name] reports he is doing better and looks forward to returning home. States he is trying to stay hydrated. Reports he still has some dizziness occasionally. He continues to work with therapy. Reports bouts of depression because "I can't do what I used to do. This is my new normal." Theatre manager will update [facility] Embedded RN Care Coordinator about writer's visit.  ? ?Provided [facility] Care Management brochure, 24 hr nurse advice line magnet, and writer's contact information.  ? ?Will continue to follow while Alan [name] remains in SNF. Will keep [place] team updated.  ? ? ?Marthenia Rolling, MSN, RN,BSN ?[place] Coordinator ?[phone] [facility]) ?[phone]  (Toll free office)  ?

## 2021-10-30 DIAGNOSIS — Z79899 Other long term (current) drug therapy: Secondary | ICD-10-CM | POA: Diagnosis not present

## 2021-10-30 DIAGNOSIS — I10 Essential (primary) hypertension: Secondary | ICD-10-CM | POA: Diagnosis not present

## 2021-10-31 ENCOUNTER — Telehealth: Payer: Medicare Other

## 2021-10-31 DIAGNOSIS — R040 Epistaxis: Secondary | ICD-10-CM | POA: Diagnosis not present

## 2021-10-31 DIAGNOSIS — N1832 Chronic kidney disease, stage 3b: Secondary | ICD-10-CM | POA: Diagnosis not present

## 2021-10-31 DIAGNOSIS — I951 Orthostatic hypotension: Secondary | ICD-10-CM | POA: Diagnosis not present

## 2021-11-02 ENCOUNTER — Encounter: Payer: Self-pay | Admitting: Hematology

## 2021-11-03 ENCOUNTER — Inpatient Hospital Stay: Payer: PRIVATE HEALTH INSURANCE | Attending: Hematology

## 2021-11-03 ENCOUNTER — Inpatient Hospital Stay: Payer: PRIVATE HEALTH INSURANCE

## 2021-11-03 VITALS — BP 169/102 | HR 68 | Temp 98.2°F | Resp 18

## 2021-11-03 DIAGNOSIS — R634 Abnormal weight loss: Secondary | ICD-10-CM | POA: Insufficient documentation

## 2021-11-03 DIAGNOSIS — K529 Noninfective gastroenteritis and colitis, unspecified: Secondary | ICD-10-CM | POA: Insufficient documentation

## 2021-11-03 DIAGNOSIS — D509 Iron deficiency anemia, unspecified: Secondary | ICD-10-CM | POA: Insufficient documentation

## 2021-11-03 DIAGNOSIS — Z87891 Personal history of nicotine dependence: Secondary | ICD-10-CM | POA: Insufficient documentation

## 2021-11-03 DIAGNOSIS — C7A019 Malignant carcinoid tumor of the small intestine, unspecified portion: Secondary | ICD-10-CM

## 2021-11-03 DIAGNOSIS — I129 Hypertensive chronic kidney disease with stage 1 through stage 4 chronic kidney disease, or unspecified chronic kidney disease: Secondary | ICD-10-CM | POA: Diagnosis not present

## 2021-11-03 DIAGNOSIS — N1832 Chronic kidney disease, stage 3b: Secondary | ICD-10-CM | POA: Diagnosis not present

## 2021-11-03 LAB — CBC WITH DIFFERENTIAL/PLATELET
Abs Immature Granulocytes: 0.02 10*3/uL (ref 0.00–0.07)
Basophils Absolute: 0 10*3/uL (ref 0.0–0.1)
Basophils Relative: 0 %
Eosinophils Absolute: 0.2 10*3/uL (ref 0.0–0.5)
Eosinophils Relative: 2 %
HCT: 27.8 % — ABNORMAL LOW (ref 39.0–52.0)
Hemoglobin: 9.9 g/dL — ABNORMAL LOW (ref 13.0–17.0)
Immature Granulocytes: 0 %
Lymphocytes Relative: 27 %
Lymphs Abs: 2.5 10*3/uL (ref 0.7–4.0)
MCH: 31.9 pg (ref 26.0–34.0)
MCHC: 35.6 g/dL (ref 30.0–36.0)
MCV: 89.7 fL (ref 80.0–100.0)
Monocytes Absolute: 1 10*3/uL (ref 0.1–1.0)
Monocytes Relative: 11 %
Neutro Abs: 5.5 10*3/uL (ref 1.7–7.7)
Neutrophils Relative %: 60 %
Platelets: 233 10*3/uL (ref 150–400)
RBC: 3.1 MIL/uL — ABNORMAL LOW (ref 4.22–5.81)
RDW: 13.7 % (ref 11.5–15.5)
WBC: 9.2 10*3/uL (ref 4.0–10.5)
nRBC: 0 % (ref 0.0–0.2)

## 2021-11-03 LAB — CMP (CANCER CENTER ONLY)
ALT: 16 U/L (ref 0–44)
AST: 23 U/L (ref 15–41)
Albumin: 3.8 g/dL (ref 3.5–5.0)
Alkaline Phosphatase: 48 U/L (ref 38–126)
Anion gap: 9 (ref 5–15)
BUN: 35 mg/dL — ABNORMAL HIGH (ref 8–23)
CO2: 29 mmol/L (ref 22–32)
Calcium: 8.9 mg/dL (ref 8.9–10.3)
Chloride: 102 mmol/L (ref 98–111)
Creatinine: 2.57 mg/dL — ABNORMAL HIGH (ref 0.61–1.24)
GFR, Estimated: 25 mL/min — ABNORMAL LOW (ref >60)
Glucose, Bld: 105 mg/dL — ABNORMAL HIGH (ref 70–99)
Potassium: 2.9 mmol/L — ABNORMAL LOW (ref 3.5–5.1)
Sodium: 140 mmol/L (ref 135–145)
Total Bilirubin: 0.6 mg/dL (ref 0.3–1.2)
Total Protein: 7.3 g/dL (ref 6.5–8.1)

## 2021-11-03 MED ORDER — LANREOTIDE ACETATE 120 MG/0.5ML ~~LOC~~ SOLN
120.0000 mg | Freq: Once | SUBCUTANEOUS | Status: AC
  Administered 2021-11-03: 120 mg via SUBCUTANEOUS
  Filled 2021-11-03: qty 120

## 2021-11-03 NOTE — Written Directive (Incomplete Revision)
MOLECULAR IMAGING AND THERAPEUTICS WRITTEN DIRECTIVE ? ? ?PATIENT NAME: Alan [name] ? ?PT DOB:   1941-05-17 ?                                             ?MRN: [id number] ? ?--------------------------------------------------------------------------------------------------------------------- ? ?[facility] ? ? ?RADIOPHARMACEUTICAL:  [facility] [place] ([facility])   ? ? ?PRESCRIBED DOSE FOR ADMINISTRATION:  _____  mCi ? ? ?ROUTE OFADMINISTRATION:  IV ? ? ?DIAGNOSIS:  Metastatic Carcinoid Tumor ? ? ?REFERRING PHYSICIAN: K[facility] ? ? ?TREATMENT #: 1 ? ? ?DATE OF LAST LONG LIVED SOMATOSTATIN INJECTION:  10/13/21 ? ? ?ADDITIONAL PHYSICIAN COMMENTS/NOTES: ? ? ?AUTHORIZED USER SIGNATURE & TIME STAMP: ?

## 2021-11-03 NOTE — Written Directive (Cosign Needed)
MOLECULAR IMAGING AND THERAPEUTICS WRITTEN DIRECTIVE ? ? ?PATIENT NAME: Alan [name] ? ?PT DOB:   Apr 07, 1941 ?                                             ?MRN: [id number] ? ?--------------------------------------------------------------------------------------------------------------------- ? ?[facility] ? ? ?RADIOPHARMACEUTICAL:  [facility] [place] ([facility])   ? ? ?PRESCRIBED DOSE FOR ADMINISTRATION:  _____  mCi ? ? ?ROUTE OFADMINISTRATION:  IV ? ? ?DIAGNOSIS:  Metastatic Carcinoid Tumor ? ? ?REFERRING PHYSICIAN: Kale ? ? ?TREATMENT #: 1 ? ? ?DATE OF LAST LONG LIVED SOMATOSTATIN INJECTION:  10/13/21 ? ? ?ADDITIONAL PHYSICIAN COMMENTS/NOTES: ? ? ?AUTHORIZED USER SIGNATURE & TIME STAMP: ?

## 2021-11-04 ENCOUNTER — Encounter: Payer: Self-pay | Admitting: Physician Assistant

## 2021-11-04 ENCOUNTER — Ambulatory Visit: Payer: Medicare Other | Admitting: Physician Assistant

## 2021-11-04 VITALS — BP 160/90 | HR 76 | Ht 73.0 in | Wt 142.0 lb

## 2021-11-04 DIAGNOSIS — I480 Paroxysmal atrial fibrillation: Secondary | ICD-10-CM

## 2021-11-04 DIAGNOSIS — I4891 Unspecified atrial fibrillation: Secondary | ICD-10-CM | POA: Diagnosis not present

## 2021-11-04 DIAGNOSIS — I495 Sick sinus syndrome: Secondary | ICD-10-CM | POA: Diagnosis not present

## 2021-11-04 DIAGNOSIS — I951 Orthostatic hypotension: Secondary | ICD-10-CM

## 2021-11-04 NOTE — Progress Notes (Signed)
?Cardiology Office Note:   ? ?Date:  11/04/2021  ? ?ID:  Alan [name], DOB [birth year], MRN [id number] ? ?PCP:  [name], MD  ?[facility] Cardiologist:  Fransico Him, MD  ?[place] Electrophysiologist:  Will Meredith Leeds, MD  ? ?Chief Complaint: 6 month follow up  ? ?History of Present Illness:   ? ?Alan [name] is a 81 y.o. 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 ? ?Admitted 09/2021 for syncope due to postural hypotension, severe orthostatic hypotension due to autonomic dysfunction in setting of dehydration and AKI. Treated with fluids.  Despite this measure, he had a syncope while working with physical therapy.  Blood pressure dropped 116/85-66/48 from sitting to standing.  Increase midodrine to 15 mg 3 times daily.  Continued Florinef 1 1 mg daily.  Recommended compression stocking and abdominal binder. ? ?Patient is here for follow-up.  He is doing rehab at [facility].  No recurrent dizziness or syncope.  Discussing radiation therapy with Lutathera.  Coordinating with nephrology given renal issue.  He uses abdominal binder and stockings.  No chest pain, shortness of breath, dizziness, palpitation, orthopnea, PND or melena. ? ? ?Past Medical History:  ?Diagnosis Date  ? Asthma   ? Carcinoid tumor, small intestine, malignant ([place])   ? Chronic diarrhea   ? CKD (chronic kidney disease) stage 3, GFR 30-59 ml/min (HCC)   ? HTN (hypertension)   ? Short gut syndrome   ? ? ?Past Surgical History:  ?Procedure Laterality Date  ? COLON SURGERY    ? 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  ? Procedure: PACEMAKER IMPLANT;  Surgeon: Constance Haw, MD;  Location: [facility];  Service: Cardiovascular;   Laterality: N/A;  ? ? ?Current Medications: ?Current Meds  ?Medication Sig  ? albuterol (VENTOLIN HFA) 108 (90 Base) MCG/ACT inhaler Inhale 2 puffs into the lungs every 6 (six) hours as needed for wheezing or shortness of breath.  ? aspirin 81 MG EC tablet Take 81 mg by mouth daily. Swallow whole.  ? dronabinol (MARINOL) 2.5 MG capsule Take 1 capsule (2.5 mg total) by mouth 2 (two) times daily before a meal.  ? feeding supplement, ENSURE COMPLETE, (ENSURE COMPLETE) LIQD Take 237 mLs by mouth 2 (two) times daily between meals.  ? fludrocortisone (FLORINEF) 0.1 MG tablet Take 1 tablet (0.1 mg total) by mouth daily.  ? iron polysaccharides (NIFEREX) 150 MG capsule TAKE 1 CAPSULE(150 MG) BY MOUTH 3 TIMES A WEEK  ? LANREOTIDE ACETATE [place] Inject 1 Dose into the skin every 30 (thirty) days.  ? midodrine (PROAMATINE) 10 MG tablet Take 1.5 tablets (15 mg total) by mouth 3 (three) times daily.  ? mirtazapine (REMERON) 15 MG tablet Take 1 tablet (15 mg total) by mouth daily.  ? Multiple Vitamins-Minerals (PRESERVISION AREDS 2 PO) Take 1 capsule by mouth daily.  ? multivitamin (ONE-A-DAY MEN'S) TABS tablet Take 1 tablet by mouth daily.  ? SYMBICORT 160-4.5 MCG/ACT inhaler Inhale 2 puffs into the lungs daily as needed (wheezing).  ?  ? ?Allergies:   Penicillins and Lisinopril  ? ?Social History  ? ?Socioeconomic History  ? Marital status: Married  ?  Spouse name: Not on file  ? Number of children: Not on file  ? Years of education: Not on file  ?  Highest education level: Not on file  ?Occupational History  ? Not on file  ?Tobacco Use  ? Smoking status: Former  ?  Packs/day: 0.30  ?  Years: 61.00  ?  Pack years: 18.30  ?  Types: Cigarettes  ?  Start date: 35  ?  Quit date: 03/03/2020  ?  Years since quitting: 1.6  ? Smokeless tobacco: Never  ?Vaping Use  ? Vaping Use: Never used  ?Substance and Sexual Activity  ? Alcohol use: Not Currently  ? Drug use: No  ? Sexual activity: Not Currently  ?Other Topics Concern  ? Not on file   ?Social History Narrative  ? Not on file  ? ?Social Determinants of Health  ? ?Financial Resource Strain: Low Risk   ? Difficulty of Paying Living Expenses: Not very hard  ?Food Insecurity: No Food Insecurity  ? Worried About Charity fundraiser in the Last Year: Never true  ? Ran Out of Food in the Last Year: Never true  ?Transportation Needs: No Transportation Needs  ? Lack of Transportation (Medical): No  ? Lack of Transportation (Non-Medical): No  ?Physical Activity: Not on file  ?Stress: Stress Concern Present  ? Feeling of Stress : Very much  ?Social Connections: Not on file  ?  ? ?Family History: ?The patient's family history includes Alzheimer's disease in his mother.   ? ?ROS:   ?Please see the history of present illness.    ?All other systems reviewed and are negative.  ? ?EKGs/Labs/Other Studies Reviewed:   ? ?The following studies were reviewed today: ? ?Echo 12/2020 ? 1. Left ventricular ejection fraction, by estimation, is 55 to 60%. The  ?left ventricle has normal function. The left ventricle has no regional  ?wall motion abnormalities. There is mild left ventricular hypertrophy of  ?the basal-septal segment. Left  ?ventricular diastolic parameters were normal.  ? 2. Right ventricular systolic function is normal. The right ventricular  ?size is normal.  ? 3. The mitral valve is normal in structure. No evidence of mitral valve  ?regurgitation. No evidence of mitral stenosis.  ? 4. The aortic valve is tricuspid. Aortic valve regurgitation is not  ?visualized. No aortic stenosis is present.  ? 5. The inferior vena cava is normal in size with greater than 50%  ?respiratory variability, suggesting right atrial pressure of 3 mmHg.  ? ?EKG:  EKG is  ordered today.  The ekg ordered today demonstrates atrial paced  ? ?Recent Labs: ?04/22/2021: TSH 1.591 ?06/26/2021: Magnesium 1.9 ?11/03/2021: ALT 16; BUN 35; Creatinine 2.57; Hemoglobin 9.9; Platelets 233; Potassium 2.9; Sodium 140  ?Recent Lipid Panel ?No  results found for: CHOL, TRIG, HDL, CHOLHDL, VLDL, LDLCALC, LDLDIRECT ? ?Physical Exam:   ? ?VS:  BP (!) 160/90 (BP Location: Left Arm, Patient Position: Sitting, Cuff Size: Normal)   Pulse 76   Ht '6\' 1"'$  (1.854 m)   Wt 142 lb (64.4 kg)   BMI 18.73 kg/m?    ? ?Wt Readings from Last 3 Encounters:  ?11/04/21 142 lb (64.4 kg)  ?10/24/21 124 lb 1.9 oz (56.3 kg)  ?10/13/21 136 lb 8 oz (61.9 kg)  ?  ? ?GEN:  Well nourished, well developed in no acute distress ?HEENT: Normal ?NECK: No JVD; No carotid bruits ?LYMPHATICS: No lymphadenopathy ?CARDIAC: RRR, no murmurs, rubs, gallops ?RESPIRATORY:  Clear to auscultation without rales, wheezing or rhonchi  ?ABDOMEN: Soft, non-tender, non-distended ?MUSCULOSKELETAL:  No edema; No deformity  ?SKIN: Warm and dry ?NEUROLOGIC:  Alert and oriented  x 3 ?PSYCHIATRIC:  Normal affect  ? ?ASSESSMENT AND PLAN:  ? ? ?Paroxysmal atrial fibrillation ?-CHA2DS2-VASc score of 3.  Not on chronic anticoagulation secondary to history of GI bleed.  Atrial paced rhythm on EKG. ? ?2.  Orthostatic hypotension//syncope ?No recurrent episode.  Doing well at rehab facility.  Continue midodrine and Florinef at current dose.  He has to live with higher blood pressure reading.  May consider reduce dose of midodrine at some point.  Continue to use abdominal binder and stockings. ? ?3.  Tachybradycardia syndrome s/p dual-chamber pacemaker ?-Followed by Dr. Curt Bears ? ?Medication Adjustments/Labs and Tests Ordered: ?Current medicines are reviewed at length with the patient today.  Concerns regarding medicines are outlined above.  ?Orders Placed This Encounter  ?Procedures  ? EKG 12-Lead  ? ?No orders of the defined types were placed in this encounter. ? ? ?Patient Instructions  ?Medication Instructions:  ?Your physician recommends that you continue on your current medications as directed. Please refer to the Current Medication list given to you today.  ? ?*If you need a refill on your cardiac medications  before your next appointment, please call your pharmacy* ? ? ?Lab Work: ?None ordered  ? ?If you have labs (blood work) drawn today and your tests are completely normal, you will receive your results only by: ?MyChart Me

## 2021-11-04 NOTE — Patient Instructions (Signed)

## 2021-11-05 ENCOUNTER — Other Ambulatory Visit: Payer: Self-pay | Admitting: *Deleted

## 2021-11-05 ENCOUNTER — Encounter: Payer: Self-pay | Admitting: Hematology

## 2021-11-05 LAB — CHROMOGRANIN A: Chromogranin A (ng/mL): 1604 ng/mL — ABNORMAL HIGH (ref 0.0–101.8)

## 2021-11-05 NOTE — Patient Outreach (Signed)
[facility] Post- Acute Care Coordinator follow up. Per [place] eligible member currently resides in [facility].   ? ?Member's PCP at [facility] has [place] care coordination team. He is active with CCM services with [facility] care coordination team. ? ?Facility site visit to [facility] skilled nursing facility. Met with Marita Kansas, SNF SW  concerning transition plan/date. Marita Kansas reports member will transition home with spouse on Saturday, May 13th. Will have [facility].  ? ?Went to Mr. Straw's room to visit. However, he was off unit in therapy.  ? ?Will update [facility] RNCM of anticipated dc date of 11/08/21 from SNF. [facility] LCSW reports she has a previously scheduled phone check with Mr. Tafoya this month.  ? ? ?Marthenia Rolling, MSN, RN,BSN ?[place] Coordinator ?[phone] [facility]) ?[phone]  (Toll free office)  ? ? ? ? ? ?

## 2021-11-06 DIAGNOSIS — R2689 Other abnormalities of gait and mobility: Secondary | ICD-10-CM | POA: Diagnosis not present

## 2021-11-06 DIAGNOSIS — Z9181 History of falling: Secondary | ICD-10-CM | POA: Diagnosis not present

## 2021-11-06 DIAGNOSIS — I10 Essential (primary) hypertension: Secondary | ICD-10-CM | POA: Diagnosis not present

## 2021-11-06 DIAGNOSIS — R2681 Unsteadiness on feet: Secondary | ICD-10-CM | POA: Diagnosis not present

## 2021-11-06 DIAGNOSIS — I951 Orthostatic hypotension: Secondary | ICD-10-CM | POA: Diagnosis not present

## 2021-11-06 DIAGNOSIS — M6281 Muscle weakness (generalized): Secondary | ICD-10-CM | POA: Diagnosis not present

## 2021-11-07 ENCOUNTER — Inpatient Hospital Stay: Payer: PRIVATE HEALTH INSURANCE

## 2021-11-07 ENCOUNTER — Telehealth: Payer: Medicare Other

## 2021-11-07 ENCOUNTER — Other Ambulatory Visit: Payer: Self-pay | Admitting: Nephrology

## 2021-11-07 DIAGNOSIS — E876 Hypokalemia: Secondary | ICD-10-CM | POA: Diagnosis not present

## 2021-11-07 DIAGNOSIS — N1832 Chronic kidney disease, stage 3b: Secondary | ICD-10-CM | POA: Diagnosis not present

## 2021-11-07 DIAGNOSIS — D3A098 Benign carcinoid tumors of other sites: Secondary | ICD-10-CM | POA: Diagnosis not present

## 2021-11-07 DIAGNOSIS — J449 Chronic obstructive pulmonary disease, unspecified: Secondary | ICD-10-CM | POA: Diagnosis not present

## 2021-11-07 DIAGNOSIS — Q6102 Congenital multiple renal cysts: Secondary | ICD-10-CM | POA: Diagnosis not present

## 2021-11-07 DIAGNOSIS — I129 Hypertensive chronic kidney disease with stage 1 through stage 4 chronic kidney disease, or unspecified chronic kidney disease: Secondary | ICD-10-CM | POA: Diagnosis not present

## 2021-11-07 DIAGNOSIS — C7A00 Malignant carcinoid tumor of unspecified site: Secondary | ICD-10-CM | POA: Diagnosis not present

## 2021-11-07 DIAGNOSIS — E46 Unspecified protein-calorie malnutrition: Secondary | ICD-10-CM | POA: Diagnosis not present

## 2021-11-07 DIAGNOSIS — I951 Orthostatic hypotension: Secondary | ICD-10-CM | POA: Diagnosis not present

## 2021-11-07 DIAGNOSIS — N184 Chronic kidney disease, stage 4 (severe): Secondary | ICD-10-CM | POA: Diagnosis not present

## 2021-11-07 DIAGNOSIS — E559 Vitamin D deficiency, unspecified: Secondary | ICD-10-CM | POA: Diagnosis not present

## 2021-11-07 DIAGNOSIS — N179 Acute kidney failure, unspecified: Secondary | ICD-10-CM | POA: Diagnosis not present

## 2021-11-07 DIAGNOSIS — K912 Postsurgical malabsorption, not elsewhere classified: Secondary | ICD-10-CM | POA: Diagnosis not present

## 2021-11-07 NOTE — Progress Notes (Signed)
Remote pacemaker transmission.   

## 2021-11-08 DIAGNOSIS — I10 Essential (primary) hypertension: Secondary | ICD-10-CM | POA: Diagnosis not present

## 2021-11-10 ENCOUNTER — Inpatient Hospital Stay: Payer: PRIVATE HEALTH INSURANCE

## 2021-11-10 ENCOUNTER — Ambulatory Visit (INDEPENDENT_AMBULATORY_CARE_PROVIDER_SITE_OTHER): Payer: Medicare Other | Admitting: Internal Medicine

## 2021-11-10 ENCOUNTER — Telehealth: Payer: Self-pay | Admitting: *Deleted

## 2021-11-10 ENCOUNTER — Encounter: Payer: Self-pay | Admitting: Internal Medicine

## 2021-11-10 ENCOUNTER — Telehealth: Payer: Medicare Other

## 2021-11-10 VITALS — BP 132/80 | HR 66 | Temp 98.4°F | Ht 73.0 in | Wt 144.0 lb

## 2021-11-10 DIAGNOSIS — C7A019 Malignant carcinoid tumor of the small intestine, unspecified portion: Secondary | ICD-10-CM

## 2021-11-10 DIAGNOSIS — F329 Major depressive disorder, single episode, unspecified: Secondary | ICD-10-CM

## 2021-11-10 DIAGNOSIS — I951 Orthostatic hypotension: Secondary | ICD-10-CM | POA: Diagnosis not present

## 2021-11-10 DIAGNOSIS — N1832 Chronic kidney disease, stage 3b: Secondary | ICD-10-CM | POA: Diagnosis not present

## 2021-11-10 DIAGNOSIS — N179 Acute kidney failure, unspecified: Secondary | ICD-10-CM | POA: Diagnosis not present

## 2021-11-10 MED ORDER — POTASSIUM CHLORIDE ER 20 MEQ PO TBCR: 1.0000 | EXTENDED_RELEASE_TABLET | Freq: Two times a day (BID) | ORAL | 5 refills | Status: AC

## 2021-11-10 MED ORDER — MIRTAZAPINE 15 MG PO TABS
15.0000 mg | ORAL_TABLET | Freq: Every day | ORAL | 3 refills | Status: DC
Start: 2021-11-10 — End: 2022-01-12

## 2021-11-10 MED ORDER — MIDODRINE HCL 5 MG PO TABS: 15.0000 mg | ORAL_TABLET | Freq: Three times a day (TID) | ORAL | 5 refills | Status: DC

## 2021-11-10 NOTE — Telephone Encounter (Signed)
Patient's wife called. He was discharged from rehab today. She does not think he is strong enough for Lutathera at this time. She wants to discuss when to start Ava treatment with Dr. Irene Limbo on 5/30 at patient's next appt. ? ?She stated he had Ultrasound of kidneys ordered by nephrologist and she will schedule that first. ? ?Ms. Cleverly also said patient should not have lanreotide on 5/18 as scheduled - it is every 30 days and he last had it on 5/8. She asked to cancel the appt on 5/18 for the injection.  Appt for injection on 5/18 canceled  ? ?He has another scheduled appt on 12/01/21 for an injection and she said he will keep that.  ? ?Message/Info sent to Dr. Irene Limbo ?

## 2021-11-10 NOTE — Progress Notes (Signed)
Patient ID: Alan [name], male   DOB: 20-May-1941, 81 y.o.   MRN: [id number] ? ? ? ?    Chief Complaint: follow up post rehab stay as PCP not available ? ?     HPI:  Alan [name] is a 81 y.o. male here after hospn apr 23 - apr 28 long history of orthostatic hypotension and currently on fludrocortisone and midodrine.  He had symptoms for last 3 days with multiple syncopal episodes.  Creatinine 2.87 slightly worse from baseline.  Blood pressure dropped from 412-87 systolic.  Given IV fluids and admitted with significant symptoms.  Patient was treated with IV fluids, continued on fludrocortisone and midodrine at home doses.  Slight improvement of orthostatic symptoms. And despite all the efforts syncopal episode while working with physical therapy.  Blood pressure 116/85-66/48 from sitting to standing.  At d/c - plan was to Continue fludrocortisone 0.1 mg daily, Increase midodrine 15 mg 3 times daily, Compression stockings, thigh-high all the time possible, Abdominal binder before mobilizing., Extreme orthostatic precautions. Accept supine hypertension to make enough room to drop systolic blood pressures, and follow-up with cardiology.  Has been home post SNF rehab since may 13 overall doing relatively well.  Good med compliance.  No falls or further syncope.  Has plan for [facility].  Saw renal in f/u already may 12 with recent mild AKI on CKD, labs drawn and pending per family.  No fever, no other new complaints  Denies worsening depressive symptoms, suicidal ideation, or panic, tolerates remeron.  Plans to f/u oncology soon. ?      ?Wt Readings from Last 3 Encounters:  ?11/10/21 144 lb (65.3 kg)  ?11/04/21 142 lb (64.4 kg)  ?10/24/21 124 lb 1.9 oz (56.3 kg)  ? ?BP Readings from Last 3 Encounters:  ?11/10/21 132/80  ?11/04/21 (!) 160/90  ?11/03/21 (!) 169/102  ? ?      ?Past Medical History:  ?Diagnosis Date  ? Asthma   ? Carcinoid tumor, small intestine, malignant ([place])   ? Chronic diarrhea   ?  CKD (chronic kidney disease) stage 3, GFR 30-59 ml/min (HCC)   ? HTN (hypertension)   ? Short gut syndrome   ? ?Past Surgical History:  ?Procedure Laterality Date  ? COLON SURGERY    ? 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  ? Procedure: PACEMAKER IMPLANT;  Surgeon: Constance Haw, MD;  Location: [place] [facility];  Service: Cardiovascular;  Laterality: N/A;  ? ? reports that he quit smoking about 20 months ago. His smoking use included cigarettes. He started smoking about 63 years ago. He has a 18.30 pack-year smoking history. He has never used smokeless tobacco. He reports that he does not currently use alcohol. He reports that he does not use drugs. ?family history includes Alzheimer's disease in his mother. ?Allergies  ?Allergen Reactions  ? Penicillins Hives and Shortness Of Breath  ? Lisinopril Itching  ? ?Current Outpatient Medications on File Prior to Visit  ?Medication Sig Dispense Refill  ? albuterol (VENTOLIN HFA) 108 (90 Base) MCG/ACT inhaler Inhale 2 puffs into the lungs every 6 (six) hours as needed for wheezing or shortness of breath. 18 g 5  ? aspirin 81 MG EC tablet Take 81 mg by mouth daily. Swallow whole.    ? dronabinol (MARINOL) 2.5 MG capsule Take 1 capsule (2.5 mg total) by mouth 2 (two) times daily before a meal. 60 capsule 0  ? feeding supplement, ENSURE COMPLETE, (ENSURE COMPLETE) LIQD  Take 237 mLs by mouth 2 (two) times daily between meals.    ? fludrocortisone (FLORINEF) 0.1 MG tablet Take 1 tablet (0.1 mg total) by mouth daily. 90 tablet 3  ? iron polysaccharides (NIFEREX) 150 MG capsule TAKE 1 CAPSULE(150 MG) BY MOUTH 3 TIMES A WEEK 30 capsule 2  ? LANREOTIDE ACETATE [place] Inject 1 Dose into the skin every 30 (thirty) days.    ? loperamide (IMODIUM A-D) 2 MG capsule Take by mouth as needed for diarrhea or loose stools.    ? Multiple Vitamins-Minerals (PRESERVISION AREDS 2 PO) Take 1 capsule by mouth daily.    ? multivitamin (ONE-A-DAY MEN'S) TABS tablet Take 1 tablet by mouth daily.    ?  SYMBICORT 160-4.5 MCG/ACT inhaler Inhale 2 puffs into the lungs daily as needed (wheezing). 3 each 1  ? ?No current facility-administered medications on file prior to visit.  ? ?     ROS:  All others reviewed and negative. ? ?Objective  ? ?     PE:  BP 132/80 (BP Location: Left Arm, Patient Position: Sitting, Cuff Size: Large)   Pulse 66   Temp 98.4 ?F (36.9 ?C) (Oral)   Ht '6\' 1"'$  (1.854 m)   Wt 144 lb (65.3 kg)   SpO2 97%   BMI 19.00 kg/m?  ? ?              Constitutional: Pt appears in NAD ?              HENT: Head: NCAT.  ?              Right Ear: External ear normal.   ?              Left Ear: External ear normal.  ?              Eyes: . Pupils are equal, round, and reactive to light. Conjunctivae and EOM are normal ?              Nose: without d/c or deformity ?              Neck: Neck supple. Gross normal ROM ?              Cardiovascular: Normal rate and regular rhythm.   ?              Pulmonary/Chest: Effort normal and breath sounds without rales or wheezing.  ?              Abd:  Soft, NT, ND, + BS, no organomegaly ?              Neurological: Pt is alert. At baseline orientation, motor grossly intact ?              Skin: Skin is warm. No rashes, no other new lesions, LE edema - none ?              Psychiatric: Pt behavior is normal without agitation  ? ?Micro: none ? ?Cardiac tracings I have personally interpreted today:  none ? ?Pertinent Radiological findings (summarize): none  ? ?Lab Results  ?Component Value Date  ? WBC 9.2 11/03/2021  ? HGB 9.9 (L) 11/03/2021  ? HCT 27.8 (L) 11/03/2021  ? PLT 233 11/03/2021  ? GLUCOSE 105 (H) 11/03/2021  ? ALT 16 11/03/2021  ? AST 23 11/03/2021  ? NA 140 11/03/2021  ? K 2.9 (L) 11/03/2021  ? CL 102 11/03/2021  ? CREATININE  2.57 (H) 11/03/2021  ? BUN 35 (H) 11/03/2021  ? CO2 29 11/03/2021  ? TSH 1.591 04/22/2021  ? INR 1.0 01/16/2021  ? HGBA1C 5.1 01/16/2021  ? ?Assessment/Plan:  ?Alan [name] Start is a 81 y.o. Black or African American [2] male with  has a past  medical history of Asthma, Carcinoid tumor, small intestine, malignant ([place]), Chronic diarrhea, CKD (chronic kidney disease) stage 3, GFR 30-59 ml/min ([place]), HTN (hypertension), and Short gut syndrome. ? ?Major depressive disorder ?Stable on home remeron,  to f/u any worsening symptoms or concerns ? ?Acute renal failure superimposed on stage 3b chronic kidney disease ([place]) ?Had recent labs per renal, declines further lab today ? ?Orthostatic hypotension ?To cont florinef, midodrine,, f/u cardiology as planned ? ?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[place]) ?Pt to f/u oncology , cont same tx ? ?Followup: Return in about 3 months (around 02/10/2022). ? ?Cathlean Cower, MD 11/11/2021 8:29 PM ?[place] ?[place] ?Internal Medicine ?

## 2021-11-10 NOTE — Patient Instructions (Signed)
Please continue all other medications as before, and refills have been done if requested. ? ?Please have the pharmacy call with any other refills you may need. ? ?Please continue your efforts at being more active, low cholesterol diet, and weight control. ? ?Please keep your appointments with your specialists as you may have planned - kidney and cardiology ? ?No further lab work needed today ? ?Please make an Appointment to return in 3 months, or sooner if needed ?

## 2021-11-11 ENCOUNTER — Encounter: Payer: Self-pay | Admitting: Internal Medicine

## 2021-11-11 NOTE — Assessment & Plan Note (Signed)
Pt to f/u oncology , cont same tx ?

## 2021-11-11 NOTE — Assessment & Plan Note (Signed)
Stable on home remeron,  to f/u any worsening symptoms or concerns ?

## 2021-11-11 NOTE — Assessment & Plan Note (Signed)
Had recent labs per renal, declines further lab today ?

## 2021-11-11 NOTE — Assessment & Plan Note (Signed)
To cont florinef, midodrine,, f/u cardiology as planned ?

## 2021-11-12 ENCOUNTER — Inpatient Hospital Stay: Payer: PRIVATE HEALTH INSURANCE

## 2021-11-13 ENCOUNTER — Inpatient Hospital Stay: Payer: PRIVATE HEALTH INSURANCE

## 2021-11-13 ENCOUNTER — Inpatient Hospital Stay (HOSPITAL_COMMUNITY)
Admission: RE | Admit: 2021-11-13 | Discharge: 2021-11-13 | Disposition: A | Discharge status: Home / Self Care | Payer: Medicare Other | Source: Ambulatory Visit | Attending: Hematology | Admitting: Hematology

## 2021-11-13 DIAGNOSIS — J45909 Unspecified asthma, uncomplicated: Secondary | ICD-10-CM | POA: Diagnosis not present

## 2021-11-13 DIAGNOSIS — N1832 Chronic kidney disease, stage 3b: Secondary | ICD-10-CM | POA: Diagnosis not present

## 2021-11-13 DIAGNOSIS — I951 Orthostatic hypotension: Secondary | ICD-10-CM | POA: Diagnosis not present

## 2021-11-14 ENCOUNTER — Ambulatory Visit (INDEPENDENT_AMBULATORY_CARE_PROVIDER_SITE_OTHER): Payer: Medicare Other | Admitting: *Deleted

## 2021-11-14 ENCOUNTER — Inpatient Hospital Stay: Payer: PRIVATE HEALTH INSURANCE

## 2021-11-14 DIAGNOSIS — I4891 Unspecified atrial fibrillation: Secondary | ICD-10-CM

## 2021-11-14 DIAGNOSIS — J45909 Unspecified asthma, uncomplicated: Secondary | ICD-10-CM | POA: Diagnosis not present

## 2021-11-14 DIAGNOSIS — N1832 Chronic kidney disease, stage 3b: Secondary | ICD-10-CM | POA: Diagnosis not present

## 2021-11-14 DIAGNOSIS — I951 Orthostatic hypotension: Secondary | ICD-10-CM

## 2021-11-14 DIAGNOSIS — C7A019 Malignant carcinoid tumor of the small intestine, unspecified portion: Secondary | ICD-10-CM | POA: Diagnosis not present

## 2021-11-14 DIAGNOSIS — I10 Essential (primary) hypertension: Secondary | ICD-10-CM

## 2021-11-14 NOTE — Patient Instructions (Signed)
Visit Information  Alan [name], thank you for taking time to talk with me today. Please don't hesitate to contact me if I can be of assistance to you before our next scheduled telephone appointment  Below are the goals we discussed today:  Patient Self-Care Activities: Patient Alan [name] will: Take medications as prescribed Attend all scheduled provider appointments Call pharmacy for medication refills Call provider office for new concerns or questions Take effort to follow heart healthy, low salt, low cholesterol diet Continue your efforts to prevent falls and syncopal (fainting) episodes: continue using your cane/ walker/ wheelchair and taking the measures you have initiated at home to stay safe in the shower and at night when you wake up to go the bathroom Work with the home health team now that you are home from the hospital/ rehab facility Stay as active as possible-- but take your time and don't over-do activity Continue to check your blood pressures regularly at home- please write down your blood pressures and the details of any dizziness you experience, so you can share this information with your cardiologist at your next office visit  Alan [name] next scheduled telephone follow up visit/ appointment is scheduled on: Tuesday, December 02, 2021 at 9:45 am- This is a PHONE [place] appointment  If you need to cancel or re-schedule our visit, please call [phone] and our care guide team will be happy to assist you.   I look forward to hearing about your progress.   [name], RN, BSN, [place] [phone]: direct office  If you are experiencing a Mental Health or [place] or need someone to talk to, please  call the Suicide and Crisis Lifeline: 988 call the [place] National Suicide Prevention Lifeline: 403-274-0329 or TTY: (343)300-9415 TTY 220-346-5574) to talk to a trained counselor call 1-800-273-TALK (toll free, 24 hour  hotline) go to [facility] [address], [place] [phone]) call 911   Patient verbalizes understanding of instructions and care plan provided today and agrees to view in [place]. Active MyChart status and patient understanding of how to access instructions and care plan via MyChart confirmed with patient  Hypotension As your heart beats, it forces blood through your body. This force is called blood pressure. If you have hypotension, you have low blood pressure.  When your blood pressure is too low, you may not get enough blood to your brain or other parts of your body. This may cause you to feel weak, light-headed, have a fast heartbeat, or even faint. Low blood pressure may be harmless, or it may cause serious problems. What are the causes? Blood loss. Not enough water in the body (dehydration). Heart problems. Hormone problems. Pregnancy. A very bad infection. Not having enough of certain nutrients. Very bad allergic reactions. Certain medicines. What increases the risk? Age. The risk increases as you get older. Conditions that affect the heart or the brain and spinal cord (central nervous system). What are the signs or symptoms? Feeling: Weak. Light-headed. Dizzy. Tired (fatigued). Blurred vision. Fast heartbeat. Fainting, in very bad cases. How is this treated? Changing your diet. This may involve drinking more water or including more salt (sodium) in your diet by eating high-salt foods. Taking medicines to raise your blood pressure. Changing how much you take (the dosage) of some of your medicines. Wearing compression stockings. These stockings help to prevent blood clots and reduce swelling in your legs. In some cases, you may need  to go to the hospital to: Receive fluids through an IV tube. Receive donated blood through an IV tube (transfusion). Get treated for an infection or heart problems, if this applies. Be monitored  while medicines that you are taking wear off. Follow these instructions at home: Eating and drinking  Drink enough fluids to keep your pee (urine) pale yellow. Eat a healthy diet. Follow instructions from your doctor about what you can eat or drink. A healthy diet includes: Fresh fruits and vegetables. Whole grains. Low-fat (lean) meats. Low-fat dairy products. If told, include more salt in your diet. Do not add extra salt to your diet unless your doctor tells you to. Eat small meals often. Avoid standing up quickly after you eat. Medicines Take over-the-counter and prescription medicines only as told by your doctor. Follow instructions from your doctor about changing how much you take of your medicines, if this applies. Do not stop or change any of your medicines on your own. General instructions  Wear compression stockings as told by your doctor. Get up slowly from lying down or sitting. Avoid hot showers and a lot of heat as told by your doctor. Return to your normal activities when your doctor says that it is safe. Do not smoke or use any products that contain nicotine or tobacco. If you need help quitting, ask your doctor. Keep all follow-up visits. Contact a doctor if: You vomit. You have watery poop (diarrhea). You have a fever for more than 2-3 days. You feel more thirsty than normal. You feel weak and tired. Get help right away if: You have chest pain. You have a fast or uneven heartbeat. You lose feeling (have numbness) in any part of your body. You cannot move your arms or your legs. You have trouble talking. You get sweaty or feel light-headed. You faint. You have trouble breathing. You have trouble staying awake. You feel mixed up (confused). These symptoms may be an emergency. Get help right away. Call 911. Do not wait to see if the symptoms will go away. Do not drive yourself to the hospital. Summary Hypotension is also called low blood pressure. It is  when the force of blood pumping through your body is too weak. Hypotension may be harmless, or it may cause serious problems. Treatment may include changing your diet and medicines, and wearing compression stockings. In very bad cases, you may need to go to the hospital. This information is not intended to replace advice given to you by your health care provider. Make sure you discuss any questions you have with your health care provider. Document Revised: 02/03/2021 Document Reviewed: 02/03/2021 Elsevier Patient Education  [facility].

## 2021-11-14 NOTE — Chronic Care Management (AMB) (Signed)
Chronic Care Management   CCM RN Visit Note  11/14/2021 Name: Alan [name] MRN: [id number] DOB: [birth year]  Subjective: Alan [name] is a 81 y.o. year old male who is a primary care patient of [name], MD. The care management team was consulted for assistance with disease management and care coordination needs.    Engaged with patient by telephone for follow up visit in response to provider referral for case management and/or care coordination services.   Consent to Services:  The patient was given information about Chronic Care Management services, agreed to services, and gave verbal consent prior to initiation of services.  Please see initial visit note for detailed documentation.  Patient agreed to services and verbal consent obtained.   Assessment: Review of patient past medical history, allergies, medications, health status, including review of consultants reports, laboratory and other test data, was performed as part of comprehensive evaluation and provision of chronic care management services.   SDOH (Social Determinants of Health) assessments and interventions performed:  SDOH Interventions    Flowsheet Row Most Recent Value  SDOH Interventions   Food Insecurity Interventions Intervention Not Indicated  [continues to deny food insecurity]  Housing Interventions Intervention Not Indicated  [continues to deny housing concerns]  Transportation Interventions Intervention Not Indicated  [reports spouse continues to provide transportation]     CCM Care Plan  Allergies  Allergen Reactions   Penicillins Hives and Shortness Of Breath   Lisinopril Itching   Outpatient Encounter Medications as of 11/14/2021  Medication Sig   albuterol (VENTOLIN HFA) 108 (90 Base) MCG/ACT inhaler Inhale 2 puffs into the lungs every 6 (six) hours as needed for wheezing or shortness of breath.   aspirin 81 MG EC tablet Take 81 mg by mouth daily. Swallow whole.   dronabinol (MARINOL) 2.5 MG  capsule Take 1 capsule (2.5 mg total) by mouth 2 (two) times daily before a meal.   feeding supplement, ENSURE COMPLETE, (ENSURE COMPLETE) LIQD Take 237 mLs by mouth 2 (two) times daily between meals.   fludrocortisone (FLORINEF) 0.1 MG tablet Take 1 tablet (0.1 mg total) by mouth daily.   iron polysaccharides (NIFEREX) 150 MG capsule TAKE 1 CAPSULE(150 MG) BY MOUTH 3 TIMES A WEEK   LANREOTIDE ACETATE Brady Inject 1 Dose into the skin every 30 (thirty) days.   loperamide (IMODIUM A-D) 2 MG capsule Take by mouth as needed for diarrhea or loose stools.   midodrine (PROAMATINE) 5 MG tablet Take 3 tablets (15 mg total) by mouth 3 (three) times daily with meals.   mirtazapine (REMERON) 15 MG tablet Take 1 tablet (15 mg total) by mouth daily.   Multiple Vitamins-Minerals (PRESERVISION AREDS 2 PO) Take 1 capsule by mouth daily.   multivitamin (ONE-A-DAY MEN'S) TABS tablet Take 1 tablet by mouth daily.   Potassium Chloride ER 20 MEQ TBCR Take 1 tablet by mouth 2 (two) times daily.   SYMBICORT 160-4.5 MCG/ACT inhaler Inhale 2 puffs into the lungs daily as needed (wheezing).   No facility-administered encounter medications on file as of 11/14/2021.   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[place]) 01/18/2021   GIB (gastrointestinal bleeding) 01/18/2021   Essential hypertension 01/18/2021   Hypokalemia due to excessive gastrointestinal loss of potassium 01/18/2021    Chronic kidney disease, stage 3a ([place]) 01/18/2021   Conditions to be addressed/monitored:  Atrial Fibrillation and HTN  Care Plan : RN Care Manager Plan of Care  Updates made by Alan Royalty, RN since 11/14/2021 12:00 AM     Problem: Chronic Disease Management Needs   Priority: High     Long-Range Goal: Ongoing adherence to established plan of care for long term chronic disease management   Start Date: 07/02/2021  Expected End Date: 07/02/2022  Priority: High  Note:   Current Barriers:  Chronic Disease Management support and education needs related to Atrial Fibrillation and HTN Hospitalization October 25-27, 2022 for AF with RVR-- 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 History of frequent recent episodes syncope/ resulting in falls-- thus far without injury-- patient reports 16- plus falls over last year, all without significant injury: patient has/ uses walker, cane, wheelchair Update 09/04/21: patient reports no new/ recent falls since mid January; confirms consistently using cane/ walker as indicated Update 11/14/21: recent hospitalization April 23-28, 2023 for ongoing syncopal episodes/ falls with injury; discharged from hospital to SNF/ rehabilitation center; discharged from [facility] 11/08/21 with home health services through [facility] to [facility]: is well-established with current counseling provider-- but would like to have counseling services in [place]: will place CCM CSW referral for same Update 09/04/21: patient reports established with psychiatric provider in [place]; had first office visit 08/19/21  [facility]- inconsistently wears hearing aids: does not like hearing aids, ambient noise-- encouraged to follow up with hearing aid provider to have adjustments as indicated Patient and spouse report that patient has had recent memory issues  RNCM Clinical Goal(s):  Patient will demonstrate ongoing health management independence as evidenced by adherence  to plan of care for A-Fib/ HTN with hypotensive syncopal episodes, presumably due to A-F        through collaboration with RN Care manager, provider, and care team.   Interventions: 1:1 collaboration with primary care provider regarding development and update of comprehensive plan of care as evidenced by provider attestation and co-signature Inter-disciplinary care team collaboration (see longitudinal plan of care) Evaluation of current treatment plan related to  self management and patient's adherence to plan as established by provider Initial assessment completed 07/02/21 Review of patient status, including review of consultants reports, relevant laboratory and other test results, and medications completed SDOH updated: no new/ unmet concerns identified Pain assessment updated: denies pain today; reports occasional abdominal discomfort secondary to "gas pains" Falls assessment updated: reports several new/ recent falls and syncopal episodes- continues using cane/ walker as needed, "pretty much all of the time;"  reinforced previously provided education around fall risks/ prevention  Medications discussed: reports spouse continues to manage medications and denies current concerns/ issues/ questions around medications; endorses adherence to taking all medications as prescribed Reviewed recent hospitalization and SNF/ rehabilitation visits: he confirms that home health services are in place through [place]: expecting PT to arrive "this afternoon" Reviewed recent provider office visits: he verbalizes a good understanding of post-office visit instructions and denies questions 11/04/21- cardiology: reports wearing compression stockings as prescribed; intermittently using abdominal binder- has noticed binder has caused episodic increased shortness of breath; reports "I take a break from  it" every "couple of days"- encouraged patient to notify cardiology team if he is unable to wear due to shortness of breath;  he is not wearing today and denies shortness of breath 11/10/21- PCP: confirms taking midodrine as prescribed Reviewed upcoming scheduled provider appointments: 11/18/21- renal US; 11/25/21 and 12/01/21- oncology provider; 12/09/21- PCP; 12/15/21- CCM CSW telephone call; patient confirms is aware of all and has plans to attend as scheduled Discussed plans with patient for ongoing care management follow up and provided patient with direct contact information for care management team     A-fib:  (Status: 11/14/21: Goal on Track (progressing): YES.) Long Term Goal  Counseled on importance of regular laboratory monitoring as prescribed Afib action plan reviewed/ reinforced Encouraged gradual/ conservative measures with activity: he reports he "is trying to get stronger:" encouraged him to not over-do and be conservative, to gradually build up with exercises; he is currently using a stationary bike that he is able to use while sitting down-- encouraged him to pace himself and exercise when others are present Reinforced previously provided education around signs/ symptoms development A-Fib along corresponding action plan- he denies signs/ symptoms today; states "overall feeling good" since he has been home from rehab visit Confirmed patient continues to report trying to follow "heart healthy diet"  Orthostatic hypotension with syncope in setting of Hx Hypertension: (Status: 11/14/21: Goal on Track (progressing): YES.) Long Term Goal  Last practice recorded BP readings:  BP Readings from Last 3 Encounters:  09/01/21 126/80  08/15/21 126/87  07/28/21 128/80  Most recent eGFR/CrCl: No results found for: EGFR  No components found for: CRCL  Evaluation of current treatment plan related to hypertension self management and patient's adherence to plan as established by provider;   Attempted to review recent blood pressures at home, post- recent hospital/ SNF discharge; he does not have any specific values to share but  tells me "they all look okay;" he denies recent episodes of significantly high- or low blood pressures, although he does tell me that "it continues to fall when I stand up from a sitting position;" confirmed he and spouse continue utilizing interventions/ strategies to prevent falls/ syncope Confirmed patient continues taking midodrine TID Encouraged patient to continues frequent blood pressure monitoring at home, and writing on paper to share with cardiology provider at upcoming scheduled office visit- he will do  Patient Goals/Self-Care Activities: As evidenced by review of EHR, collaboration with care team, and patient reporting during CCM RN CM outreach,  Patient [place] will: Take medications as prescribed Attend all scheduled provider appointments Call pharmacy for medication refills Call provider office for new concerns or questions Take effort to follow heart healthy, low salt, low cholesterol diet Continue your efforts to prevent falls and syncopal (fainting) episodes: continue using your cane/ walker/ wheelchair and taking the measures you have initiated at home to stay safe in the shower and at night when you wake up to go the bathroom Work with the home health team now that you are home from the hospital/ rehab facility Stay as active as possible-- but take your time and don't over-do activity Continue to check your blood pressures regularly at home- please write down your blood pressures and the details of any dizziness you experience, so you can share this information with your cardiologist at your next office visit    Plan: Telephone follow up appointment with care management team member scheduled for: Tuesday, December 02, 2021 at 9:45 am The patient has been provided with contact  information for the care management team and has been advised to call with any health related questions or concerns  [name], RN, BSN, [place] [phone]: direct office

## 2021-11-17 ENCOUNTER — Telehealth: Payer: Self-pay

## 2021-11-17 DIAGNOSIS — J45909 Unspecified asthma, uncomplicated: Secondary | ICD-10-CM | POA: Diagnosis not present

## 2021-11-17 DIAGNOSIS — I951 Orthostatic hypotension: Secondary | ICD-10-CM | POA: Diagnosis not present

## 2021-11-17 DIAGNOSIS — N1832 Chronic kidney disease, stage 3b: Secondary | ICD-10-CM | POA: Diagnosis not present

## 2021-11-17 NOTE — Telephone Encounter (Signed)
Alan [name] is calling to report that the pt is feeling rather low today. Pt reports that he was frequently going to the bathroom last night to urinate. Pt is also very weak and tired.  Bhavin, PT states he was calling to see if the pt needs to be seen or if they could do anything on their end.   Any recommendations please reach out to the pt or his wife.  Please advise

## 2021-11-18 ENCOUNTER — Ambulatory Visit (HOSPITAL_COMMUNITY)
Admission: RE | Admit: 2021-11-18 | Discharge: 2021-11-18 | Disposition: A | Discharge status: Home / Self Care | Payer: Medicare Other | Source: Ambulatory Visit | Attending: Nephrology | Admitting: Nephrology

## 2021-11-18 ENCOUNTER — Ambulatory Visit (INDEPENDENT_AMBULATORY_CARE_PROVIDER_SITE_OTHER): Payer: Medicare Other | Admitting: Psychologist

## 2021-11-18 DIAGNOSIS — E876 Hypokalemia: Secondary | ICD-10-CM | POA: Diagnosis not present

## 2021-11-18 DIAGNOSIS — Q6102 Congenital multiple renal cysts: Secondary | ICD-10-CM

## 2021-11-18 DIAGNOSIS — F411 Generalized anxiety disorder: Secondary | ICD-10-CM

## 2021-11-18 DIAGNOSIS — N184 Chronic kidney disease, stage 4 (severe): Secondary | ICD-10-CM

## 2021-11-18 DIAGNOSIS — N281 Cyst of kidney, acquired: Secondary | ICD-10-CM | POA: Diagnosis not present

## 2021-11-18 DIAGNOSIS — F331 Major depressive disorder, recurrent, moderate: Secondary | ICD-10-CM

## 2021-11-18 DIAGNOSIS — N200 Calculus of kidney: Secondary | ICD-10-CM | POA: Diagnosis not present

## 2021-11-18 NOTE — Progress Notes (Signed)
[facility] Counselor/Therapist Progress Note  Patient ID: Alan [name], MRN: [id number],    Date: 11/18/2021  Time Spent: 1:05 pm to 1:50 pm; total time: 45 minutes   This session was held via in person. The patient consented to in-person therapy and was in the clinician's office. Limits of confidentiality were discussed with the patient.   Treatment Type: Individual Therapy  Reported Symptoms: Some anxiety related to changes in health status  Mental Status Exam: Appearance:  Well Groomed     Behavior: Appropriate  Motor: Normal  Speech/Language:  Clear and Coherent  Affect: Appropriate  Mood: normal  Thought process: normal  Thought content:   WNL  Sensory/Perceptual disturbances:   WNL  Orientation: oriented to person, place, and time/date  Attention: Good  Concentration: Good  Memory: WNL  Fund of knowledge:  Good  Insight:   Fair  Judgment:  Fair  Impulse Control: Good   Risk Assessment: Danger to Self:  No Self-injurious Behavior: No Danger to Others: No Duty to Warn:no Physical Aggression / Violence:No  Access to Firearms a concern: No  Gang Involvement:No   Subjective: Beginning the session, patient stated that he is doing okay despite experiencing some challenging situations since the last appointment. Specifically, he fell twice, and was hospitalized. He also indicated that he is experiencing more diarrhea and that the cancer appears to be progressing. Per the patient, he is waiting to hear about starting radiation treatment. He also talked about the balance of radiation treatment and how it impacts his kidneys and working with both oncology and nephrology. He primarily spent the session reflecting on quality of life. He voiced at the end that he felt like he was losing his dignity. He asked to follow up. He denied suicidal and homicidal ideation.    Interventions:  Worked on developing a therapeutic relationship with the patient using active  listening and reflective statement. Provided emotional support using empathy and validation. Used summary statements. Normalized and validated expressed thoughts and emotions. Reviewed events since the last session. Processed expressed thoughts and emotions. Used socratic questions to assist the patient gain insight into self. Identified the theme of quality of life and explored what that looks like to the patient. Briefly provided psychoeducation about palliative care and describing it as an additional layer of support for the patient and spouse. Processed thoughts and emotions. Received permission from patient and spouse to reach out to palliative care. Reviewed coping strategies. Validated thoughts and emotions.  Provided empathic statements. Assessed for suicidal and homicidal ideation.   Homework: NA  Next Session: Emotional support.   Diagnosis: F41.1 generalized anxiety disorder and F33.1 major depressive affective disorder, recurrent, moderate   Plan:   Client Abilities: Friendly and easy to develop rapport  Client Preferences: ACT and CBT  Client statement of Needs: Coping skills and emotional support  Treatment Level:Outpatient   Goals Alleviate depressive symptoms Recognize, accept, and cope with depressive feelings Develop healthy thinking patterns Develop healthy interpersonal relationships Reduce overall frequency, intensity, and duration of anxiety Stabilize anxiety level wile increasing ability to function Enhance ability to effectively cope with full variety of stressors Learn and implement coping skills that result in a reduction of anxiety   Objectives target date for all objectives is 08/19/2022 Verbalize an understanding of the cognitive, physiological, and behavioral components of anxiety Learning and implement calming skills to reduce overall anxiety Verbalize an understanding of the role that cognitive biases play in excessive irrational worry and persistent  anxiety symptoms  Identify, challenge, and replace based fearful talk Learn and implement problem solving strategies Identify and engage in pleasant activities Learning and implement personal and interpersonal skills to reduce anxiety and improve interpersonal relationships Learn to accept limitations in life and commit to tolerating, rather than avoiding, unpleasant emotions while accomplishing meaningful goals Identify major life conflicts from the past and present that form the basis for present anxiety Maintain involvement in work, family, and social activities Reestablish a consistent sleep-wake cycle Cooperate with a medical evaluation  Cooperate with a medication evaluation by a physician Verbalize an accurate understanding of depression Verbalize an understanding of the treatment Identify and replace thoughts that support depression Learn and implement behavioral strategies Verbalize an understanding and resolution of current interpersonal problems Learn and implement problem solving and decision making skills Learn and implement conflict resolution skills to resolve interpersonal problems Verbalize an understanding of healthy and unhealthy emotions verbalize insight into how past relationships may be influence current experiences with depression Use mindfulness and acceptance strategies and increase value based behavior  Increase hopeful statements about the future.  Interventions Engage the patient in behavioral activation Use instruction, modeling, and role-playing to build the client's general social, communication, and/or conflict resolution skills Use Acceptance and Commitment Therapy to help client accept uncomfortable realities in order to accomplish value-consistent goals Reinforce the client's insight into the role of his/her past emotional pain and present anxiety  Support the client in following through with work, family, and social activities Teach and implement sleep  hygiene practices  Refer the patient to a physician for a psychotropic medication consultation Monito the clint's psychotropic medication compliance Discuss how anxiety typically involves excessive worry, various bodily expressions of tension, and avoidance of what is threatening that interact to maintain the problem  Teach the patient relaxation skills Assign the patient homework Discuss examples demonstrating that unrealistic worry overestimates the probability of threats and underestimates patient's ability  Assist the patient in analyzing his or her worries Help patient understand that avoidance is reinforcing  Consistent with treatment model, discuss how change in cognitive, behavioral, and interpersonal can help client alleviate depression CBT Behavioral activation help the client explore the relationship, nature of the dispute,  Help the client develop new interpersonal skills and relationships Conduct Problem solving therapy Teach conflict resolution skills Use a process-experiential approach Conduct TLDP Conduct ACT Evaluate need for psychotropic medication Monitor adherence to medication   The patient and clinician reviewed the treatment plan on 09/09/2021. The patient approved of the treatment plan.    Conception Chancy, PsyD

## 2021-11-19 ENCOUNTER — Telehealth: Payer: Self-pay

## 2021-11-19 DIAGNOSIS — J45909 Unspecified asthma, uncomplicated: Secondary | ICD-10-CM | POA: Diagnosis not present

## 2021-11-19 DIAGNOSIS — N1832 Chronic kidney disease, stage 3b: Secondary | ICD-10-CM | POA: Diagnosis not present

## 2021-11-19 DIAGNOSIS — I951 Orthostatic hypotension: Secondary | ICD-10-CM | POA: Diagnosis not present

## 2021-11-19 NOTE — Telephone Encounter (Signed)
Pt wife contacted to set up new pt appt, wife confirmed and verbalized agreement. This RN explained what palliative care is to pt wife, who also verbalized understanding. No further questions or concerns at this time.

## 2021-11-20 ENCOUNTER — Telehealth: Payer: Self-pay | Admitting: Internal Medicine

## 2021-11-20 DIAGNOSIS — N1832 Chronic kidney disease, stage 3b: Secondary | ICD-10-CM | POA: Diagnosis not present

## 2021-11-20 DIAGNOSIS — J45909 Unspecified asthma, uncomplicated: Secondary | ICD-10-CM | POA: Diagnosis not present

## 2021-11-20 DIAGNOSIS — I951 Orthostatic hypotension: Secondary | ICD-10-CM | POA: Diagnosis not present

## 2021-11-20 NOTE — Telephone Encounter (Signed)
Called to report a fall outside of home.   Increased weakness today, generally not feeling good- vitals norms, increased diarrhea reported over past several days.   Pt drunk 2 bottles of Gatorade. Did education on controlling diarrhea.   Urinating okay. Does labs need to be drawn?   Requesting a call back ASAP.

## 2021-11-21 ENCOUNTER — Other Ambulatory Visit: Payer: Self-pay

## 2021-11-21 DIAGNOSIS — C7A019 Malignant carcinoid tumor of the small intestine, unspecified portion: Secondary | ICD-10-CM

## 2021-11-21 DIAGNOSIS — J45909 Unspecified asthma, uncomplicated: Secondary | ICD-10-CM | POA: Diagnosis not present

## 2021-11-21 DIAGNOSIS — I951 Orthostatic hypotension: Secondary | ICD-10-CM | POA: Diagnosis not present

## 2021-11-21 DIAGNOSIS — N1832 Chronic kidney disease, stage 3b: Secondary | ICD-10-CM | POA: Diagnosis not present

## 2021-11-21 NOTE — Telephone Encounter (Signed)
Pt's wife, Ilda Foil, has been informed that the pt should present to the ED for treatment and IV fluids.

## 2021-11-25 ENCOUNTER — Inpatient Hospital Stay (HOSPITAL_BASED_OUTPATIENT_CLINIC_OR_DEPARTMENT_OTHER): Payer: PRIVATE HEALTH INSURANCE | Admitting: Hematology

## 2021-11-25 ENCOUNTER — Inpatient Hospital Stay (HOSPITAL_BASED_OUTPATIENT_CLINIC_OR_DEPARTMENT_OTHER): Payer: PRIVATE HEALTH INSURANCE | Admitting: Nurse Practitioner

## 2021-11-25 ENCOUNTER — Encounter: Payer: Self-pay | Admitting: Nurse Practitioner

## 2021-11-25 ENCOUNTER — Other Ambulatory Visit: Payer: Self-pay

## 2021-11-25 ENCOUNTER — Inpatient Hospital Stay: Payer: PRIVATE HEALTH INSURANCE

## 2021-11-25 VITALS — BP 200/109

## 2021-11-25 VITALS — BP 207/113 | HR 79 | Temp 97.9°F | Wt 141.1 lb

## 2021-11-25 DIAGNOSIS — C7A019 Malignant carcinoid tumor of the small intestine, unspecified portion: Secondary | ICD-10-CM

## 2021-11-25 DIAGNOSIS — K529 Noninfective gastroenteritis and colitis, unspecified: Secondary | ICD-10-CM | POA: Diagnosis not present

## 2021-11-25 DIAGNOSIS — D500 Iron deficiency anemia secondary to blood loss (chronic): Secondary | ICD-10-CM

## 2021-11-25 DIAGNOSIS — R531 Weakness: Secondary | ICD-10-CM

## 2021-11-25 DIAGNOSIS — Z515 Encounter for palliative care: Secondary | ICD-10-CM

## 2021-11-25 DIAGNOSIS — Z7189 Other specified counseling: Secondary | ICD-10-CM

## 2021-11-25 DIAGNOSIS — Z87891 Personal history of nicotine dependence: Secondary | ICD-10-CM | POA: Diagnosis not present

## 2021-11-25 DIAGNOSIS — D509 Iron deficiency anemia, unspecified: Secondary | ICD-10-CM | POA: Diagnosis not present

## 2021-11-25 DIAGNOSIS — F419 Anxiety disorder, unspecified: Secondary | ICD-10-CM

## 2021-11-25 DIAGNOSIS — R634 Abnormal weight loss: Secondary | ICD-10-CM | POA: Diagnosis not present

## 2021-11-25 DIAGNOSIS — N1832 Chronic kidney disease, stage 3b: Secondary | ICD-10-CM | POA: Diagnosis not present

## 2021-11-25 DIAGNOSIS — I129 Hypertensive chronic kidney disease with stage 1 through stage 4 chronic kidney disease, or unspecified chronic kidney disease: Secondary | ICD-10-CM

## 2021-11-25 DIAGNOSIS — F32A Depression, unspecified: Secondary | ICD-10-CM

## 2021-11-25 DIAGNOSIS — R630 Anorexia: Secondary | ICD-10-CM

## 2021-11-25 LAB — CMP (CANCER CENTER ONLY)
ALT: 24 U/L (ref 0–44)
AST: 17 U/L (ref 15–41)
Albumin: 3.9 g/dL (ref 3.5–5.0)
Alkaline Phosphatase: 57 U/L (ref 38–126)
Anion gap: 8 (ref 5–15)
BUN: 41 mg/dL — ABNORMAL HIGH (ref 8–23)
CO2: 26 mmol/L (ref 22–32)
Calcium: 9.5 mg/dL (ref 8.9–10.3)
Chloride: 108 mmol/L (ref 98–111)
Creatinine: 2.54 mg/dL — ABNORMAL HIGH (ref 0.61–1.24)
GFR, Estimated: 25 mL/min — ABNORMAL LOW (ref >60)
Glucose, Bld: 112 mg/dL — ABNORMAL HIGH (ref 70–99)
Potassium: 3.5 mmol/L (ref 3.5–5.1)
Sodium: 142 mmol/L (ref 135–145)
Total Bilirubin: 0.6 mg/dL (ref 0.3–1.2)
Total Protein: 7.1 g/dL (ref 6.5–8.1)

## 2021-11-25 LAB — CBC WITH DIFFERENTIAL/PLATELET
Abs Immature Granulocytes: 0.02 10*3/uL (ref 0.00–0.07)
Basophils Absolute: 0 10*3/uL (ref 0.0–0.1)
Basophils Relative: 0 %
Eosinophils Absolute: 0.1 10*3/uL (ref 0.0–0.5)
Eosinophils Relative: 1 %
HCT: 29.7 % — ABNORMAL LOW (ref 39.0–52.0)
Hemoglobin: 10.5 g/dL — ABNORMAL LOW (ref 13.0–17.0)
Immature Granulocytes: 0 %
Lymphocytes Relative: 26 %
Lymphs Abs: 2.1 10*3/uL (ref 0.7–4.0)
MCH: 31.5 pg (ref 26.0–34.0)
MCHC: 35.4 g/dL (ref 30.0–36.0)
MCV: 89.2 fL (ref 80.0–100.0)
Monocytes Absolute: 0.7 10*3/uL (ref 0.1–1.0)
Monocytes Relative: 9 %
Neutro Abs: 4.9 10*3/uL (ref 1.7–7.7)
Neutrophils Relative %: 64 %
Platelets: 236 10*3/uL (ref 150–400)
RBC: 3.33 MIL/uL — ABNORMAL LOW (ref 4.22–5.81)
RDW: 13.2 % (ref 11.5–15.5)
WBC: 7.8 10*3/uL (ref 4.0–10.5)
nRBC: 0 % (ref 0.0–0.2)

## 2021-11-25 LAB — IRON AND IRON BINDING CAPACITY (CC-WL,HP ONLY)
Iron: 65 ug/dL (ref 45–182)
Saturation Ratios: 27 % (ref 17.9–39.5)
TIBC: 244 ug/dL — ABNORMAL LOW (ref 250–450)
UIBC: 179 ug/dL (ref 117–376)

## 2021-11-25 LAB — FERRITIN: Ferritin: 89 ng/mL (ref 24–336)

## 2021-11-25 NOTE — Progress Notes (Signed)
[place]  Telephone:([phone]) [phone] Fax:(336) [phone]   Name: Alan [name] Date: 11/25/2021 MRN: [id number]  DOB: Jun 21, 1941  Patient Care Team: [name], MD as PCP - General (Internal Medicine) Sueanne Margarita, MD as PCP - Cardiology (Cardiology) Constance Haw, MD as PCP - Electrophysiology (Cardiology) [name], RN as Case Manager Laurance Flatten, Bosie Helper, LCSW as Social Worker (Licensed Clinical Social Worker) Irene Limbo, Cloria Spring, MD as Consulting Physician (Hematology)    REASON FOR CONSULTATION: Alan [name] is a 81 y.o. male with medical history including metastatic small bowel carcinoid (lanreotide), short bowel syndrom, CKD stage IIIB, depression, anxiety,hypertension, and iron deficiency anemia.  Palliative ask to see for symptom management and goals of care.    SOCIAL HISTORY:     reports that he quit smoking about 20 months ago. His smoking use included cigarettes. He started smoking about 63 years ago. He has a 18.30 pack-year smoking history. He has never used smokeless tobacco. He reports that he does not currently use alcohol. He reports that he does not use drugs.  ADVANCE DIRECTIVES:  Patient reports he has a documented advanced directive. His wife, Alan [name] is his primary health care decision maker and his son, Alan [name] is secondary.   CODE STATUS: Full code  PAST MEDICAL HISTORY: Past Medical History:  Diagnosis Date   Asthma    Carcinoid tumor, small intestine, malignant (HCC)    Chronic diarrhea    CKD (chronic kidney disease) stage 3, GFR 30-59 ml/min (HCC)    HTN (hypertension)    Short gut syndrome     PAST SURGICAL HISTORY:  Past Surgical History:  Procedure Laterality Date   COLON SURGERY     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   Procedure: PACEMAKER IMPLANT;  Surgeon: Constance Haw, MD;  Location: [facility];  Service: Cardiovascular;  Laterality: N/A;     HEMATOLOGY/ONCOLOGY HISTORY:  Oncology History   No history exists.    ALLERGIES:  is allergic to penicillins and lisinopril.  MEDICATIONS:  Current Outpatient Medications  Medication Sig Dispense Refill   albuterol (VENTOLIN HFA) 108 (90 Base) MCG/ACT inhaler Inhale 2 puffs into the lungs every 6 (six) hours as needed for wheezing or shortness of breath. 18 g 5   aspirin 81 MG EC tablet Take 81 mg by mouth daily. Swallow whole.     dronabinol (MARINOL) 2.5 MG capsule Take 1 capsule (2.5 mg total) by mouth 2 (two) times daily before a meal. 60 capsule 0   feeding supplement, ENSURE COMPLETE, (ENSURE COMPLETE) LIQD Take 237 mLs by mouth 2 (two) times daily between meals.     fludrocortisone (FLORINEF) 0.1 MG tablet Take 1 tablet (0.1 mg total) by mouth daily. 90 tablet 3   iron polysaccharides (NIFEREX) 150 MG capsule TAKE 1 CAPSULE(150 MG) BY MOUTH 3 TIMES A WEEK 30 capsule 2   LANREOTIDE ACETATE Allen Inject 1 Dose into the skin every 30 (thirty) days.     loperamide (IMODIUM A-D) 2 MG capsule Take by mouth as needed for diarrhea or loose stools.     midodrine (PROAMATINE) 5 MG tablet Take 3 tablets (15 mg total) by mouth 3 (three) times daily with meals. 270 tablet 5   mirtazapine (REMERON) 15 MG tablet Take 1 tablet (15 mg total) by mouth daily. 90 tablet 3   Multiple Vitamins-Minerals (PRESERVISION AREDS 2 PO) Take 1 capsule by mouth daily.     multivitamin (ONE-A-DAY  MEN'S) TABS tablet Take 1 tablet by mouth daily.     Potassium Chloride ER 20 MEQ TBCR Take 1 tablet by mouth 2 (two) times daily. 60 tablet 5   SYMBICORT 160-4.5 MCG/ACT inhaler Inhale 2 puffs into the lungs daily as needed (wheezing). 3 each 1   No current facility-administered medications for this visit.    VITAL SIGNS: BP (!) 200/109  There were no vitals filed for this visit.  Estimated body mass index is 18.62 kg/m as calculated from the following:   Height as of 11/10/21: '6\' 1"'$  (1.854 m).   Weight as of an  earlier encounter on 11/25/21: 141 lb 1.6 oz (64 kg).  LABS: CBC:    Component Value Date/Time   WBC 7.8 11/25/2021 1213   HGB 10.5 (L) 11/25/2021 1213   HGB 11.1 (L) 08/15/2021 0937   HGB 12.5 (L) 01/23/2011 0810   HCT 29.7 (L) 11/25/2021 1213   HCT 35.8 (L) 01/23/2011 0810   PLT 236 11/25/2021 1213   PLT 205 08/15/2021 0937   PLT 162 01/23/2011 0810   MCV 89.2 11/25/2021 1213   MCV 93.2 01/23/2011 0810   NEUTROABS 4.9 11/25/2021 1213   NEUTROABS 3.6 01/23/2011 0810   LYMPHSABS 2.1 11/25/2021 1213   LYMPHSABS 2.9 01/23/2011 0810   MONOABS 0.7 11/25/2021 1213   MONOABS 0.6 01/23/2011 0810   EOSABS 0.1 11/25/2021 1213   EOSABS 0.3 01/23/2011 0810   BASOSABS 0.0 11/25/2021 1213   BASOSABS 0.0 01/23/2011 0810   Comprehensive Metabolic Panel:    Component Value Date/Time   NA 142 11/25/2021 1213   K 3.5 11/25/2021 1213   CL 108 11/25/2021 1213   CO2 26 11/25/2021 1213   BUN 41 (H) 11/25/2021 1213   CREATININE 2.54 (H) 11/25/2021 1213   GLUCOSE 112 (H) 11/25/2021 1213   CALCIUM 9.5 11/25/2021 1213   AST 17 11/25/2021 1213   ALT 24 11/25/2021 1213   ALKPHOS 57 11/25/2021 1213   BILITOT 0.6 11/25/2021 1213   PROT 7.1 11/25/2021 1213   ALBUMIN 3.9 11/25/2021 1213    RADIOGRAPHIC STUDIES: US RENAL  Result Date: 11/19/2021 CLINICAL DATA:  Chronic kidney disease, stage IV EXAM: RENAL / URINARY TRACT ULTRASOUND COMPLETE COMPARISON:  04/16/2016, 08/07/2021. FINDINGS: Right Kidney: Renal measurements: 10.5 x 6.0 x 6.4 cm = volume: 208.3 mL. Increased parenchymal echogenicity. A cyst is present in the lower pole measuring 2.3 x 2.5 x 1.9 cm. There is a calculus in the lower pole measuring 1.5 cm. No hydronephrosis. Left Kidney: Renal measurements: 10.5 x 6.6 x 4.6 cm = volume: 165.6 mL. Increased parenchymal echogenicity. There is a cyst in the upper pole measuring 5.7 x 6.5 x 5.6 cm. There is a hypoechoic mass in the midpole measuring 2.6 x 2.2 x 2.5 cm with no internal  vascularity. No renal calculus or hydronephrosis. Bladder: Appears normal for degree of bladder distention. The prostate gland is enlarged measuring 5.3 x 4.3 x 5.6 cm, total volume 67.2 mL. Other: There is a trace amount of perihepatic free fluid IMPRESSION: 1. Increased renal parenchymal echogenicity bilaterally, compatible with history of medical renal disease. 2. Left renal mass measuring 2.6 x 2.2 x 2.5 cm. Multiphase CT or MRI is recommended for further evaluation. 3. Bilateral renal cysts. 4. Right renal calculus. 5. Enlarged prostate gland. Electronically Signed   By: Brett Fairy M.D.   On: 11/19/2021 04:18    PERFORMANCE STATUS (ECOG) : 2 - Symptomatic, <50% confined to bed  Review of Systems  Constitutional:  Positive for appetite change, fatigue and unexpected weight change.  Gastrointestinal:  Positive for diarrhea.  Neurological:  Positive for weakness.  Psychiatric/Behavioral:  The patient is nervous/anxious.   Unless otherwise noted, a complete review of systems is negative.  Physical Exam General: NAD, thin, in wheelchair  Cardiovascular: regular rate and rhythm Pulmonary: clear ant fields Abdomen: soft, nontender, + bowel sounds Extremities: no edema, no joint deformities Skin: no rashes Neurological: Weakness but otherwise nonfocal  IMPRESSION: This is my initial visit with Mr. Heidler. His wife, Alan Foil is also present. No acute distress noted. He is in a wheelchair and has a cane with him. Thin. Endorses ongoing weakness, decreased appetite, anxiety, and diarrhea.   I introduced myself, [place] RN, and Palliative's role in collaboration with the oncology team. Concept of Palliative Care was introduced as specialized medical care for people and their families living with serious illness.  It focuses on providing relief from the symptoms and stress of a serious illness.  The goal is to improve quality of life for both the patient and the family. Values and goals of care  important to patient and family were attempted to be elicited.   Mr. Kosel lives in the home with his wife of almost 10 years. They have been together for over 30 years but will be married 18 years this coming June. He has 2 sons, 1 grandchild, and 2 great-grandchildren. 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 Enjoys sports specifically golfing.   At home he is ambulatory with a cane or walker. Endorses falls due to weakness and gait instability. Unable to ambulate long distances. Able to perform most ADLs independently. Relies heavily on his wife for daily support. She keeps track of his appointments and assist in medication administration. Appetite fluctuates but is improving some.   Chronic diarrhea  Mr. Glidden struggles with chronic diarrhea. States he generally has 4-5 bowel movements daily. Is taking Imodium with some relief.   Neoplasm related anorexia/weight loss [place] reports his appetite is fair. Some days are better than others. He is taking dronabinol twice daily as prescribed by Dr. Irene Limbo. Followed by Dietician. His wife reports some noticeable changes and desires to eat. He drinks 1 Ensure daily. Encouraged to increase to 2 if possible. We discussed at length ways to increase protein intake, focusing on small frequent meals, and snacking in between.   He shares that he has loss approximately 140 lbs over the past year. His weight today is 141.1 lbs up some from 136lbs on 4/17.   Goals of Care   We discussed his current illness and what it means in the larger context of Her on-going co-morbidities. Natural disease trajectory and expectations were discussed.  Mr. Clegg expresses his hopes for some improved quality of life for what time he has left as this is more important than quantity.   I created space and opportunity for both he and his wife to express their thoughts and feelings. He relies heavily on his wife to the point she has been unable  to do any self-care. She is however preparing for a week long trip to [place]. We discussed what concerns or worries him the most. Heberto fears his wife will not come back to him as he is up in age with significant health conditions which they know are not curative. Emotional support provided. His wife assured him that she will not leave him and that his wellbeing and being by his side is her  priority.   He mentions having significant anxiety due to thoughts and fear of the unknown and future. He states "I seem to look at the glass being half empty versus being half full!" Emotional support provided. We discussed what life looks like and taking things one day at a time, not focusing on the things that cannot be changed, focusing on making memories and living for the moment. He expressed understanding and appreciation. Mr. Edmister speaks to difficulty adjusting to requiring assistance and loss of independence. I acknowledge his feelings empathizing with his statements. We discussed ways that maybe he could assume some independence although it may not be 100%. Discussions around not focusing or entertaining negative thoughts although worries and fear is common given his cancer and other co-morbidities it is important to cope and discuss his feelings. He is being followed by Dr. Michail Sermon also which he is very appreciative of.   Education provided on caregiver burden in addition to patient support and needs. We discussed getting both patient and wife connected in available support groups that are offered here at the cancer center. He expressed appreciation.   Mr. Macneal shares he does have his affairs in order. Has a documented advanced directive. His wife is medical decision maker and their oldest son Alan Gula is secondary. He wishes to remain a full code at this time as long as his quality of life is somewhat decent. No long-term ventilation or life-sustaining measures.   I discussed the importance of continued  conversation with family and their medical providers regarding overall plan of care and treatment options, ensuring decisions are within the context of the patients values and GOCs.  PLAN: Establishes a therapeutic relationship with patient and wife. Education provided on palliative's role in collaboration with his medical team.  Ongoing goals of care discussions and emotional support provided.  Advanced directive completed with his wife as primary HCPOA and their oldest son, Darren as secondary. Wishes to remain a FULL CODE at this time.  Dronabinol twice daily as prescribed per Dr. Irene Limbo Mirtazapine daily as prescribed per PCP Dr. Ronnald Ramp. Could potentially increase dose to 30 mg daily or 15 mg twice daily.  I will plan to see patient back in 2-4 weeks in collaboration to other oncology appointments.    Patient expressed understanding and was in agreement with this plan. He also understands that He can call the clinic at any time with any questions, concerns, or complaints.   Thank you for your referral and allowing Palliative to assist in Mr. Julian Medina Ngo's care.   Number and complexity of problems addressed: 3 HIGH - 1 or more chronic illnesses with SEVERE exacerbation, progression, or side effects of treatment - advanced cancer, pain. Any controlled substances utilized were prescribed in the context of palliative care.  Time Total: 55 min   Visit consisted of counseling and education dealing with the complex and emotionally intense issues of symptom management and palliative care in the setting of serious and potentially life-threatening illness.Greater than 50%  of this time was spent counseling and coordinating care related to the above assessment and plan.  Signed by: Alda Lea, AGPCNP-BC Palliative Medicine Team/[facility]

## 2021-11-26 ENCOUNTER — Other Ambulatory Visit: Payer: Self-pay | Admitting: Hematology

## 2021-11-26 DIAGNOSIS — I951 Orthostatic hypotension: Secondary | ICD-10-CM | POA: Diagnosis not present

## 2021-11-26 DIAGNOSIS — I4891 Unspecified atrial fibrillation: Secondary | ICD-10-CM

## 2021-11-26 DIAGNOSIS — N1832 Chronic kidney disease, stage 3b: Secondary | ICD-10-CM | POA: Diagnosis not present

## 2021-11-26 DIAGNOSIS — J45909 Unspecified asthma, uncomplicated: Secondary | ICD-10-CM | POA: Diagnosis not present

## 2021-11-26 MED ORDER — CHOLESTYRAMINE 4 G PO PACK: 4.0000 g | PACK | Freq: Two times a day (BID) | ORAL | 1 refills | Status: DC

## 2021-11-26 MED ORDER — DIPHENOXYLATE-ATROPINE 2.5-0.025 MG PO TABS: 1.0000 | ORAL_TABLET | Freq: Two times a day (BID) | ORAL | 0 refills | Status: AC | PRN

## 2021-11-27 ENCOUNTER — Telehealth: Payer: Self-pay | Admitting: Internal Medicine

## 2021-11-27 DIAGNOSIS — N1832 Chronic kidney disease, stage 3b: Secondary | ICD-10-CM | POA: Diagnosis not present

## 2021-11-27 DIAGNOSIS — J45909 Unspecified asthma, uncomplicated: Secondary | ICD-10-CM | POA: Diagnosis not present

## 2021-11-27 DIAGNOSIS — I951 Orthostatic hypotension: Secondary | ICD-10-CM | POA: Diagnosis not present

## 2021-11-27 NOTE — Telephone Encounter (Signed)
I have given Alan [name] the verbal order to proceed with a speech therapy evaluation. However, please advise on her additional concerns.

## 2021-11-27 NOTE — Telephone Encounter (Signed)
Alan [name] with [facility] calls today in regards to an order and clinical FYI regarding PT.  Alan [name] would like a speech therapy evaluation order put in for PT at   [facility]: [place] also states that PT is having increased cough over the past week as well as issues swallowing and eating. Oxygen reading fine but is having shortness of breath more frequently. PT has been having to use inhaler more often within the last week as well.  Alan [name] fears that with the combination of breathing, issues swallowing and eating (food getting stuck in throat), PT is at risk for pneumonia.

## 2021-11-28 ENCOUNTER — Telehealth: Payer: Self-pay | Admitting: Internal Medicine

## 2021-11-28 DIAGNOSIS — I951 Orthostatic hypotension: Secondary | ICD-10-CM | POA: Diagnosis not present

## 2021-11-28 DIAGNOSIS — N1832 Chronic kidney disease, stage 3b: Secondary | ICD-10-CM | POA: Diagnosis not present

## 2021-11-28 DIAGNOSIS — J45909 Unspecified asthma, uncomplicated: Secondary | ICD-10-CM | POA: Diagnosis not present

## 2021-11-28 LAB — CHROMOGRANIN A: Chromogranin A (ng/mL): 1723 ng/mL — ABNORMAL HIGH (ref 0.0–101.8)

## 2021-11-28 NOTE — Telephone Encounter (Signed)
Pt is having BP fluctuations today 132/70 sitting, and 95/55 standing.

## 2021-12-01 ENCOUNTER — Telehealth: Payer: Self-pay

## 2021-12-01 ENCOUNTER — Other Ambulatory Visit: Payer: Self-pay

## 2021-12-01 ENCOUNTER — Other Ambulatory Visit: Payer: Self-pay | Admitting: Nephrology

## 2021-12-01 ENCOUNTER — Other Ambulatory Visit: Payer: Self-pay | Admitting: Internal Medicine

## 2021-12-01 ENCOUNTER — Inpatient Hospital Stay: Payer: Medicare Other | Attending: Hematology

## 2021-12-01 ENCOUNTER — Inpatient Hospital Stay: Payer: Medicare Other

## 2021-12-01 VITALS — BP 127/78 | HR 82 | Temp 98.5°F | Resp 18

## 2021-12-01 DIAGNOSIS — C7A019 Malignant carcinoid tumor of the small intestine, unspecified portion: Secondary | ICD-10-CM | POA: Diagnosis not present

## 2021-12-01 DIAGNOSIS — N2889 Other specified disorders of kidney and ureter: Secondary | ICD-10-CM

## 2021-12-01 DIAGNOSIS — I951 Orthostatic hypotension: Secondary | ICD-10-CM | POA: Diagnosis not present

## 2021-12-01 DIAGNOSIS — J45909 Unspecified asthma, uncomplicated: Secondary | ICD-10-CM | POA: Diagnosis not present

## 2021-12-01 DIAGNOSIS — N1832 Chronic kidney disease, stage 3b: Secondary | ICD-10-CM | POA: Diagnosis not present

## 2021-12-01 LAB — CMP (CANCER CENTER ONLY)
ALT: 13 U/L (ref 0–44)
AST: 13 U/L — ABNORMAL LOW (ref 15–41)
Albumin: 3.9 g/dL (ref 3.5–5.0)
Alkaline Phosphatase: 52 U/L (ref 38–126)
Anion gap: 10 (ref 5–15)
BUN: 44 mg/dL — ABNORMAL HIGH (ref 8–23)
CO2: 27 mmol/L (ref 22–32)
Calcium: 9.4 mg/dL (ref 8.9–10.3)
Chloride: 106 mmol/L (ref 98–111)
Creatinine: 2.49 mg/dL — ABNORMAL HIGH (ref 0.61–1.24)
GFR, Estimated: 25 mL/min — ABNORMAL LOW (ref >60)
Glucose, Bld: 71 mg/dL (ref 70–99)
Potassium: 3.9 mmol/L (ref 3.5–5.1)
Sodium: 143 mmol/L (ref 135–145)
Total Bilirubin: 0.5 mg/dL (ref 0.3–1.2)
Total Protein: 6.7 g/dL (ref 6.5–8.1)

## 2021-12-01 LAB — CBC WITH DIFFERENTIAL/PLATELET
Abs Immature Granulocytes: 0.02 10*3/uL (ref 0.00–0.07)
Basophils Absolute: 0 10*3/uL (ref 0.0–0.1)
Basophils Relative: 0 %
Eosinophils Absolute: 0 10*3/uL (ref 0.0–0.5)
Eosinophils Relative: 0 %
HCT: 28.8 % — ABNORMAL LOW (ref 39.0–52.0)
Hemoglobin: 10.2 g/dL — ABNORMAL LOW (ref 13.0–17.0)
Immature Granulocytes: 0 %
Lymphocytes Relative: 14 %
Lymphs Abs: 1.2 10*3/uL (ref 0.7–4.0)
MCH: 31.1 pg (ref 26.0–34.0)
MCHC: 35.4 g/dL (ref 30.0–36.0)
MCV: 87.8 fL (ref 80.0–100.0)
Monocytes Absolute: 0.7 10*3/uL (ref 0.1–1.0)
Monocytes Relative: 9 %
Neutro Abs: 6.2 10*3/uL (ref 1.7–7.7)
Neutrophils Relative %: 77 %
Platelets: 190 10*3/uL (ref 150–400)
RBC: 3.28 MIL/uL — ABNORMAL LOW (ref 4.22–5.81)
RDW: 13.2 % (ref 11.5–15.5)
WBC: 8.2 10*3/uL (ref 4.0–10.5)
nRBC: 0 % (ref 0.0–0.2)

## 2021-12-01 MED ORDER — LANREOTIDE ACETATE 120 MG/0.5ML ~~LOC~~ SOLN
120.0000 mg | Freq: Once | SUBCUTANEOUS | Status: AC
  Administered 2021-12-01: 120 mg via SUBCUTANEOUS
  Filled 2021-12-01: qty 120

## 2021-12-01 NOTE — Telephone Encounter (Signed)
RN and NP called pt to check in, spoke with pt and wife, both reporting feeling well. Pt reported PT visited in the morning and walking with them. Pt reports that during walk he became dizzy, but no fall. Pt reports eating good breakfast, still trying to increase oral intake. Pt and wife report new puppy in the home which they are excited about. Pt reports pain managed well, no further questions or needs at this time. Pt and wife verbalized understanding to call with any new questions or concerns.

## 2021-12-01 NOTE — Telephone Encounter (Signed)
Pt has been having the issue for the last 1 to 2 weeks.   118/68 upon arrival BP.  Pt states that he felt like he was going to pass out. PT was able to lower him to the ground while spouse got his wheelchair. No injuries. When he got back in the home after doing step therapy on the outside of his home his BP was 145/82 Sitting 118/69 Standing.  I was able to reschedule his 6/13 appt to 6/8 for the BP issues/6 mo fu  Please advise

## 2021-12-02 ENCOUNTER — Encounter: Payer: Self-pay | Admitting: Hematology and Oncology

## 2021-12-02 ENCOUNTER — Ambulatory Visit (INDEPENDENT_AMBULATORY_CARE_PROVIDER_SITE_OTHER): Payer: Medicare Other | Admitting: *Deleted

## 2021-12-02 DIAGNOSIS — I951 Orthostatic hypotension: Secondary | ICD-10-CM

## 2021-12-02 DIAGNOSIS — I4891 Unspecified atrial fibrillation: Secondary | ICD-10-CM

## 2021-12-02 DIAGNOSIS — I10 Essential (primary) hypertension: Secondary | ICD-10-CM

## 2021-12-02 NOTE — Progress Notes (Signed)
HEMATOLOGY/ONCOLOGY CLINIC NOTE  Date of Service: .11/25/2021   Patient Care Team: [name], MD as PCP - General (Internal Medicine) Sueanne Margarita, MD as PCP - Cardiology (Cardiology) Constance Haw, MD as PCP - Electrophysiology (Cardiology) [name], RN as Case Manager Laurance Flatten, Bosie Helper, LCSW as Social Worker (Licensed Clinical Social Worker) Irene Limbo, Cloria Spring, MD as Consulting Physician (Hematology) Pickenpack-Cousar, Carlena Sax, NP as Nurse Practitioner (Nurse Practitioner)  CHIEF COMPLAINTS/PURPOSE OF CONSULTATION:  Follow-up for continued evaluation and management of metastatic intestinal neuroendocrine tumor  HISTORY OF PRESENTING ILLNESS:  Please see previous note for details on initial presentation  Alan [name] is here for follow-up of his metastatic small intestinal neuroendocrine tumor.  He notes he still having diarrhea 3-4 times a day.  Has been taking Imodium once a day and we discussed that he could take it up to 4 times a day.  We also discussed starting cholestyramine and using Lomotil if needed has not for uncontrolled diarrhea.  He continues to be on lanreotide.  His Lutathera plan has been put on hold since he had issues with dehydration and hospitalization and his kidney numbers have gotten worse.  He has been given a referral and has been seen at [facility] for further evaluation of his chronic kidney disease. He was recommended to stay well-hydrated. Labs done today were discussed in detail with the patient. He continues to be somewhat orthostatic.  MEDICAL HISTORY:  Past Medical History:  Diagnosis Date   Asthma    Carcinoid tumor, small intestine, malignant (HCC)    Chronic diarrhea    CKD (chronic kidney disease) stage 3, GFR 30-59 ml/min (HCC)    HTN (hypertension)    Short gut syndrome     SURGICAL HISTORY: Past Surgical History:  Procedure Laterality Date   COLON SURGERY     PACEMAKER IMPLANT  N/A 04/23/2021   Procedure: PACEMAKER IMPLANT;  Surgeon: Constance Haw, MD;  Location: [facility];  Service: Cardiovascular;  Laterality: N/A;    SOCIAL HISTORY: Social History   Socioeconomic History   Marital status: Married    Spouse name: Not on file   Number of children: Not on file   Years of education: Not on file   Highest education level: Not on file  Occupational History   Not on file  Tobacco Use   Smoking status: Former    Packs/day: 0.30    Years: 61.00    Pack years: 18.30    Types: Cigarettes    Start date: 83    Quit date: 03/03/2020    Years since quitting: 1.7   Smokeless tobacco: Never  Vaping Use   Vaping Use: Never used  Substance and Sexual Activity   Alcohol use: Not Currently   Drug use: No   Sexual activity: Not Currently  Other Topics Concern   Not on file  Social History Narrative   Not on file   Social Determinants of Health   Financial Resource Strain: Low Risk    Difficulty of Paying Living Expenses: Not very hard  Food Insecurity: No Food Insecurity   Worried About Running Out of Food in the Last Year: Never true   [place] in the Last Year: Never true  Transportation Needs: No Transportation Needs   Lack of Transportation (Medical): No   Lack of Transportation (Non-Medical): No  Physical Activity: Not on file  Stress: Stress Concern Present   Feeling  of Stress : Very much  Social Connections: Not on file  Intimate Partner Violence: Not on file    FAMILY HISTORY: Family History  Problem Relation Age of Onset   Alzheimer's disease Mother     ALLERGIES:  is allergic to penicillins and lisinopril.  MEDICATIONS:  Current Outpatient Medications  Medication Sig Dispense Refill   diphenoxylate-atropine (LOMOTIL) 2.5-0.025 MG tablet Take 1 tablet by mouth 2 (two) times daily as needed for diarrhea or loose stools. 30 tablet 0   albuterol (VENTOLIN HFA) 108 (90 Base) MCG/ACT inhaler Inhale 2 puffs into the  lungs every 6 (six) hours as needed for wheezing or shortness of breath. 18 g 5   aspirin 81 MG EC tablet Take 81 mg by mouth daily. Swallow whole.     cholestyramine (QUESTRAN) 4 g packet MIX AND DRINK 1 PACKET(4 GRAMS) BY MOUTH TWICE DAILY WITH A MEAL 180 each 0   dronabinol (MARINOL) 2.5 MG capsule Take 1 capsule (2.5 mg total) by mouth 2 (two) times daily before a meal. 60 capsule 0   feeding supplement, ENSURE COMPLETE, (ENSURE COMPLETE) LIQD Take 237 mLs by mouth 2 (two) times daily between meals.     fludrocortisone (FLORINEF) 0.1 MG tablet Take 1 tablet (0.1 mg total) by mouth daily. 90 tablet 3   iron polysaccharides (NIFEREX) 150 MG capsule TAKE 1 CAPSULE(150 MG) BY MOUTH 3 TIMES A WEEK 30 capsule 2   LANREOTIDE ACETATE [place] Inject 1 Dose into the skin every 30 (thirty) days.     loperamide (IMODIUM A-D) 2 MG capsule Take by mouth as needed for diarrhea or loose stools.     midodrine (PROAMATINE) 5 MG tablet Take 3 tablets (15 mg total) by mouth 3 (three) times daily with meals. 270 tablet 5   mirtazapine (REMERON) 15 MG tablet Take 1 tablet (15 mg total) by mouth daily. 90 tablet 3   Multiple Vitamins-Minerals (PRESERVISION AREDS 2 PO) Take 1 capsule by mouth daily.     multivitamin (ONE-A-DAY MEN'S) TABS tablet Take 1 tablet by mouth daily.     Potassium Chloride ER 20 MEQ TBCR Take 1 tablet by mouth 2 (two) times daily. 60 tablet 5   SYMBICORT 160-4.5 MCG/ACT inhaler Inhale 2 puffs into the lungs daily as needed (wheezing). 3 each 1   No current facility-administered medications for this visit.    REVIEW OF SYSTEMS:   10 Point review of Systems was done is negative except as noted above.  PHYSICAL EXAMINATION: ECOG PERFORMANCE STATUS: 2 - Symptomatic, <50% confined to bed  . Vitals:   11/25/21 1247  BP: (!) 207/113  Pulse: 79  Temp: 97.9 F (36.6 C)  SpO2: 100%   Filed Weights   11/25/21 1247  Weight: 141 lb 1.6 oz (64 kg)   .Body mass index is 18.62  kg/m. NAD GENERAL:alert, in no acute distress and comfortable SKIN: no acute rashes, no significant lesions EYES: conjunctiva are pink and non-injected, sclera anicteric OROPHARYNX: MMM, no exudates, no oropharyngeal erythema or ulceration NECK: supple, no JVD LYMPH:  no palpable lymphadenopathy in the cervical, axillary or inguinal regions LUNGS: clear to auscultation b/l with normal respiratory effort HEART: regular rate & rhythm ABDOMEN:  normoactive bowel sounds , non tender, not distended. Extremity: no pedal edema PSYCH: alert & oriented x 3 with fluent speech NEURO: no focal motor/sensory deficits   LABORATORY DATA:  I have reviewed the data as listed     Latest Ref Rng & Units 12/01/2021  2:59 PM 11/25/2021   12:13 PM 11/03/2021    2:51 PM  CBC  WBC 4.0 - 10.5 K/uL 8.2   7.8   9.2    Hemoglobin 13.0 - 17.0 g/dL 10.2   10.5   9.9    Hematocrit 39.0 - 52.0 % 28.8   29.7   27.8    Platelets 150 - 400 K/uL 190   236   233         Latest Ref Rng & Units 12/01/2021    2:59 PM 11/25/2021   12:13 PM 11/03/2021    2:51 PM  CMP  Glucose 70 - 99 mg/dL 71   112   105    BUN 8 - 23 mg/dL 44   41   35    Creatinine 0.61 - 1.24 mg/dL 2.49   2.54   2.57    Sodium 135 - 145 mmol/L 143   142   140    Potassium 3.5 - 5.1 mmol/L 3.9   3.5   2.9    Chloride 98 - 111 mmol/L 106   108   102    CO2 22 - 32 mmol/L '27   26   29    '$ Calcium 8.9 - 10.3 mg/dL 9.4   9.5   8.9    Total Protein 6.5 - 8.1 g/dL 6.7   7.1   7.3    Total Bilirubin 0.3 - 1.2 mg/dL 0.5   0.6   0.6    Alkaline Phos 38 - 126 U/L 52   57   48    AST 15 - 41 U/L '13   17   23    '$ ALT 0 - 44 U/L '13   24   16      '$ Lab Results  Component Value Date   IRON 65 11/25/2021   TIBC 244 (L) 11/25/2021   IRONPCTSAT 27 11/25/2021   (Iron and TIBC)  Lab Results  Component Value Date   FERRITIN 89 11/25/2021    RADIOGRAPHIC STUDIES:  I have personally reviewed the radiological images as listed and agreed with the findings  in the report. US RENAL  Result Date: 11/19/2021 CLINICAL DATA:  Chronic kidney disease, stage IV EXAM: RENAL / URINARY TRACT ULTRASOUND COMPLETE COMPARISON:  04/16/2016, 08/07/2021. FINDINGS: Right Kidney: Renal measurements: 10.5 x 6.0 x 6.4 cm = volume: 208.3 mL. Increased parenchymal echogenicity. A cyst is present in the lower pole measuring 2.3 x 2.5 x 1.9 cm. There is a calculus in the lower pole measuring 1.5 cm. No hydronephrosis. Left Kidney: Renal measurements: 10.5 x 6.6 x 4.6 cm = volume: 165.6 mL. Increased parenchymal echogenicity. There is a cyst in the upper pole measuring 5.7 x 6.5 x 5.6 cm. There is a hypoechoic mass in the midpole measuring 2.6 x 2.2 x 2.5 cm with no internal vascularity. No renal calculus or hydronephrosis. Bladder: Appears normal for degree of bladder distention. The prostate gland is enlarged measuring 5.3 x 4.3 x 5.6 cm, total volume 67.2 mL. Other: There is a trace amount of perihepatic free fluid IMPRESSION: 1. Increased renal parenchymal echogenicity bilaterally, compatible with history of medical renal disease. 2. Left renal mass measuring 2.6 x 2.2 x 2.5 cm. Multiphase CT or MRI is recommended for further evaluation. 3. Bilateral renal cysts. 4. Right renal calculus. 5. Enlarged prostate gland. Electronically Signed   By: Brett Fairy M.D.   On: 11/19/2021 04:18    ASSESSMENT & PLAN:   81 year old male  with  #1 Metastatic small bowel malignant carcinoid with chronic significant diarrhea and weight loss. Patient is currently on lanreotide every 4 weeks  #2 s/p  severe hypokalemia -now resolved after aggressive potassium replacement. Likely related to his decreased p.o. intake, chronic diarrhea and the use of Florinef.  #3 Iron deficiency anemia . Lab Results  Component Value Date   IRON 65 11/25/2021   TIBC 244 (L) 11/25/2021   IRONPCTSAT 27 11/25/2021   (Iron and TIBC)  Lab Results  Component Value Date   FERRITIN 89 11/25/2021   #4 acute on  chronic renal insufficiency. #5 cancer and cancer treatment related anorexia with weight loss. #6 depression and anxiety Plan Labs done today were discussed in detail with the patient. Continues to have grade 2 diarrhea and so we added as needed Lomotil and cholestyramine to his as needed Imodium at this time. No toxicities from lanreotide and we will continue this every 4 weeks. He was recommended to maintain good hydration and food intake. -We will continue Marinol for appetite stimulation. Lutathera plan on hold due to worsening kidney function in the context of dehydration. Chronic kidney disease etiology being worked up by nephrology at [facility] [place] on management of her significant anxiety.  Wife is present with him during this appointment.  Follow-up -Referal to [facility] for renal mass for further evaluation and management -Continue lanreotide every 4 weeksx 6  -Return to clinic with Dr. Irene Limbo with labs in 2 weeks  The total time spent in the appointment was 31 minutes*.  All of the patient's questions were answered with apparent satisfaction. The patient knows to call the clinic with any problems, questions or concerns.   Sullivan Lone MD MS AAHIVMS [facility] Hematology/Oncology Physician [facility]  .*Total Encounter Time as defined by the Centers for Medicare and Medicaid Services includes, in addition to the face-to-face time of a patient visit (documented in the note above) non-face-to-face time: obtaining and reviewing outside history, ordering and reviewing medications, tests or procedures, care coordination (communications with other health care professionals or caregivers) and documentation in the medical record.

## 2021-12-02 NOTE — Patient Instructions (Signed)
Visit Information  Shun and [name], thank you for taking time to talk with me today about Alan [name]'s health care needs. Please don't hesitate to contact me if I can be of assistance to you before our next scheduled telephone appointment  Below are the goals we discussed today:  Patient Self-Care Activities: Patient Alan [name] will: Take medications as prescribed Attend all scheduled provider appointments Call pharmacy for medication refills Call provider office for new concerns or questions Take effort to follow heart healthy, low salt, low cholesterol diet Continue your efforts to prevent falls and syncopal (fainting) episodes: continue using your cane/ walker/ wheelchair and taking the measures you have initiated at home to stay safe in the shower and at night when you wake up to go the bathroom Work with the home health team now that you are home from the hospital/ rehab facility Stay as active as possible-- but take your time, don't rush, and don't over-do activity Continue to check your blood pressures regularly at home- please write down your blood pressures and the details of any dizziness you experience, so you can share this information with your cardiologist at your next office visit Continue working with the Social Worker at Dr. Ronnald Ramp' office and continue attending your scheduled visits with Dr. Michail Sermon  Our next scheduled telephone follow up visit/ appointment is scheduled on: Tuesday, January 06, 2022 at 11:30 am- This is a PHONE [place] appointment  If you need to cancel or re-schedule our visit, please call [phone] and our care guide team will be happy to assist you.   I look forward to hearing about your progress.   [name], RN, BSN, [place] [phone]: direct office  If you are experiencing a Mental Health or [place] or need someone to talk to, please  call the Suicide and Crisis Lifeline:  988 call the [place] National Suicide Prevention Lifeline: 908-603-3748 or TTY: 737-224-4463 TTY 361-657-9296) to talk to a trained counselor call 1-800-273-TALK (toll free, 24 hour hotline) go to [facility] [address], [place] [phone]) call 911   Patient verbalizes understanding of instructions and care plan provided today and agrees to view in [place]. Active MyChart status and patient understanding of how to access instructions and care plan via MyChart confirmed with patient  Shortness of Breath, Adult Shortness of breath means you have trouble breathing. Shortness of breath could be a sign of a medical problem. Follow these instructions at home:  Pollution Do not smoke or use any products that contain nicotine or tobacco. If you need help quitting, ask your doctor. Avoid things that can make it harder to breathe, such as: Smoke of all kinds. This includes smoke from campfires or forest fires. Do not smoke or allow others to smoke in your home. Mold. Dust. Air pollution. Chemical smells. Things that can give you an allergic reaction (allergens) if you have allergies. Keep your living space clean. Use products that help remove mold and dust. General instructions Watch for any changes in your symptoms. Take over-the-counter and prescription medicines only as told by your doctor. This includes oxygen therapy and inhaled medicines. Rest as needed. Return to your normal activities when your doctor says that it is safe. Keep all follow-up visits. Contact a doctor if: Your condition does not get better as soon as expected. You have a hard time doing your normal activities, even after you rest. You have new symptoms. You  cannot walk up stairs. You cannot exercise the way you normally do. Get help right away if: Your shortness of breath gets worse. You have trouble breathing when you are resting. You feel light-headed or you  faint. You have a cough that is not helped by medicines. You cough up blood. You have pain with breathing. You have pain in your chest, arms, shoulders, or belly (abdomen). You have a fever. These symptoms may be an emergency. Get help right away. Call 911. Do not wait to see if the symptoms will go away. Do not drive yourself to the hospital. Summary Shortness of breath is when you have trouble breathing enough air. It can be a sign of a medical problem. Avoid things that make it hard for you to breathe, such as smoking, pollution, mold, and dust. Watch for any changes in your symptoms. Contact your doctor if you do not get better or you get worse. This information is not intended to replace advice given to you by your health care provider. Make sure you discuss any questions you have with your health care provider. Document Revised: 02/01/2021 Document Reviewed: 02/01/2021 Elsevier Patient Education  [place].

## 2021-12-02 NOTE — Chronic Care Management (AMB) (Signed)
Chronic Care Management   CCM RN Visit Note  12/02/2021 Name: Alan [name] MRN: [id number] DOB: July 11, 1940  Subjective: Alan [name] is a 81 y.o. year old male who is a primary care patient of Alan Lima, MD. The care management team was consulted for assistance with disease management and care coordination needs.    Engaged with patient by telephone for follow up visit in response to provider referral for case management and/or care coordination services.   Consent to Services:  The patient was given information about Chronic Care Management services, agreed to services, and gave verbal consent prior to initiation of services.  Please see initial visit note for detailed documentation.   Patient agreed to services and verbal consent obtained.   Assessment: Review of patient past medical history, allergies, medications, health status, including review of consultants reports, laboratory and other test data, was performed as part of comprehensive evaluation and provision of chronic care management services.  CCM Care Plan  Allergies  Allergen Reactions   Penicillins Hives and Shortness Of Breath   Lisinopril Itching   Outpatient Encounter Medications as of 12/02/2021  Medication Sig   albuterol (VENTOLIN HFA) 108 (90 Base) MCG/ACT inhaler Inhale 2 puffs into the lungs every 6 (six) hours as needed for wheezing or shortness of breath.   aspirin 81 MG EC tablet Take 81 mg by mouth daily. Swallow whole.   cholestyramine (QUESTRAN) 4 g packet MIX AND DRINK 1 PACKET(4 GRAMS) BY MOUTH TWICE DAILY WITH A MEAL   diphenoxylate-atropine (LOMOTIL) 2.5-0.025 MG tablet Take 1 tablet by mouth 2 (two) times daily as needed for diarrhea or loose stools.   dronabinol (MARINOL) 2.5 MG capsule Take 1 capsule (2.5 mg total) by mouth 2 (two) times daily before a meal.   feeding supplement, ENSURE COMPLETE, (ENSURE COMPLETE) LIQD Take 237 mLs by mouth 2 (two) times daily between meals.    fludrocortisone (FLORINEF) 0.1 MG tablet Take 1 tablet (0.1 mg total) by mouth daily.   iron polysaccharides (NIFEREX) 150 MG capsule TAKE 1 CAPSULE(150 MG) BY MOUTH 3 TIMES A WEEK   LANREOTIDE ACETATE [place] Inject 1 Dose into the skin every 30 (thirty) days.   loperamide (IMODIUM A-D) 2 MG capsule Take by mouth as needed for diarrhea or loose stools.   midodrine (PROAMATINE) 5 MG tablet Take 3 tablets (15 mg total) by mouth 3 (three) times daily with meals.   mirtazapine (REMERON) 15 MG tablet Take 1 tablet (15 mg total) by mouth daily.   Multiple Vitamins-Minerals (PRESERVISION AREDS 2 PO) Take 1 capsule by mouth daily.   multivitamin (ONE-A-DAY MEN'S) TABS tablet Take 1 tablet by mouth daily.   Potassium Chloride ER 20 MEQ TBCR Take 1 tablet by mouth 2 (two) times daily.   SYMBICORT 160-4.5 MCG/ACT inhaler Inhale 2 puffs into the lungs daily as needed (wheezing).   No facility-administered encounter medications on file as of 12/02/2021.   Patient Active Problem List   Diagnosis Date Noted   Acute renal failure superimposed on stage 3b chronic kidney disease (Alan [name]) 10/19/2021   Oropharyngeal dysphagia 06/27/2021   Short gut syndrome    Syncope 04/22/2021   Major depressive disorder 04/22/2021   Sinus node dysfunction (Alan [name]) 04/22/2021   Atrial fibrillation with rapid ventricular response (Alan [name]) 04/22/2021   AV block, 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   Orthostatic hypotension 02/20/2021   Iron deficiency anemia due to chronic blood loss 02/20/2021   Chronic diarrhea 01/18/2021   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  Alan [name]) 01/18/2021   GIB (gastrointestinal bleeding) 01/18/2021   Essential hypertension 01/18/2021   Hypokalemia due to excessive gastrointestinal loss of potassium 01/18/2021   Chronic kidney disease, stage 3a (Alan [name]) 01/18/2021   Conditions to be addressed/monitored:  Atrial Fibrillation, HTN, and cancer  Care Plan : RN Care Manager Plan of Care  Updates made by Alan Royalty, RN  since 12/02/2021 12:00 AM     Problem: Chronic Disease Management Needs   Priority: High     Long-Range Goal: Ongoing adherence to established plan of care for long term chronic disease management   Start Date: 07/02/2021  Expected End Date: 07/02/2022  Priority: High  Note:   Current Barriers:  Chronic Disease Management support and education needs related to Atrial Fibrillation and HTN Hospitalization October 25-27, 2022 for AF with RVR-- 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 History of frequent recent episodes syncope/ resulting in falls-- thus far without injury-- patient reports 16- plus falls over last year, all without significant injury: patient has/ uses walker, cane, wheelchair Update 09/04/21: patient reports no new/ recent falls since mid January; confirms consistently using cane/ walker as indicated Update 11/14/21: recent hospitalization April 23-28, 2023 for ongoing syncopal episodes/ falls with injury; [name] center; discharged from [name] 11/08/21 with home health services through [name] 12/02/21- report home health services continue; endorses active participation in all disciplines Transitioning care from [name] to [name]: is well-established with current counseling provider-- but would like to have counseling services in [name]: will place CCM CSW referral for same Update 09/04/21: patient reports established with psychiatric provider in [name]; had first office visit 08/19/21 Update 12/02/21- reports has transitioned all of his health care providers to Alan [name]: this barrier is now resolved Alan [name]: does not like hearing [name], ambient noise-- encouraged to follow up with hearing aid provider to have adjustments as indicated Patient and spouse report that patient has had recent memory issues  RNCM Clinical Goal(s):  Patient will demonstrate ongoing health management independence as  evidenced by adherence to plan of care for A-Fib/ HTN with hypotensive syncopal episodes, presumably due to A-F        through collaboration with RN Care manager, provider, and care team.   Interventions: 1:1 collaboration with primary care provider regarding development and update of comprehensive plan of care as evidenced by provider attestation and co-signature Inter-disciplinary care team collaboration (see longitudinal plan of care) Evaluation of current treatment plan related to  self management and patient's adherence to plan as established by provider Initial assessment completed 07/02/21 Review of patient status, including review of consultants reports, relevant laboratory and other test results, and medications completed Pain assessment updated: continues to deny pain Falls assessment updated: continues to report ongoing falls due to dizziness/ pre-syncope; reports last fall "end of May;" denies serious injury with fall; confirms "weak and felt like I was going to pass out;" continues using cane/ walker/ wheelchair as needed, "pretty much all of the time;"  reinforced previously provided education around fall risks/ prevention  Medications discussed: reports spouse continues to manage medications; denies current concerns/ recent changes/ issues/ questions around medications; endorses adherence to taking all medications as prescribed-- they had forgotten that Dr. 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: awaiting MRI scheduling prior to deciding whether or not patient will attempt lutathera radiation treatment; confirm they have referrals to urology and renal providers; continues taking supportive lanreotide  injections monthly Reviewed recent psychiatric provider office visit 11/18/21: reports visit went "fine" Reviewed upcoming scheduled provider appointments: 12/04/21- PCP; 12/15/21- CCM CSW; [name]/03 and [name]/31/23- oncology provider; 02/11/22- cardiology provider; patient confirms is aware of all and has plans to attend as scheduled Discussed plans with patient for ongoing care management follow up and provided patient with direct contact information for care management team     A-fib:  (Status: 12/02/21: Goal on Track (progressing): YES.) Long Term Goal  Counseled on importance of regular laboratory monitoring as prescribed Afib action plan reviewed/ reinforced Reinforced previously provided encouragement to remain as active as possible with gradual/ conservative measures again encouraged him to not over-do and be conservative, to gradually build up with exercises; continues using a stationary bike that he is able to use while sitting down-- but admits lately has not been feeling as well as he did after our last outreach and has not been using as much; encouraged him to pace himself and exercise when others are present in home Reinforced previously provided education around signs/ symptoms development A-Fib along corresponding action plan- he denies signs/ symptoms today; but does mention that when he lies down he "sometimes has been feeling more short of breath than normal;" confirms he has been using his maintenance and rescue inhalers as prescribed- verbalizes plans to discuss with Dr. Ronnald Ramp at upcoming office visit Confirmed patient continues to report trying to follow "heart healthy diet;" continues trying to manage periodic ongoing decreased appetite  Orthostatic hypotension with syncope in setting of Hx Hypertension: (Status: 12/02/21: Goal on Track (progressing): YES.) Long Term Goal  Last practice recorded BP readings:  BP Readings from Last 3 Encounters:  12/01/21 127/78  11/25/21 (!) 200/109   11/25/21 (!) 207/113  Most recent eGFR/CrCl: No results found for: EGFR  No components found for: CRCL  Evaluation of current treatment plan related to hypertension self management and patient's adherence to plan as established by provider;   Confirmed patient continues taking midodrine TID Encouraged patient to continues frequent blood pressure monitoring at home, and writing on paper to share with cardiology provider at upcoming scheduled office visit-   Patient Goals/Self-Care Activities: As evidenced by review of EHR, collaboration with care team, and patient reporting during CCM RN CM outreach,  Patient Jazir will: Take medications as prescribed Attend all scheduled provider appointments Call pharmacy for medication refills Call provider office for new concerns or questions Take effort to follow heart healthy, low salt, low cholesterol diet Continue your efforts to prevent falls and syncopal (fainting) episodes: continue using your cane/ walker/ wheelchair and taking the measures you have initiated at home to stay safe in the shower and at night when you wake up to go the bathroom Work with the home health team now that you are home from the [name]/ rehab facility Stay as active as possible-- but take your time, don't rush, and don't over-do activity Continue to check your blood pressures regularly at home- please write down your blood pressures and the details of any dizziness you experience, so you can share this information with your cardiologist at your next office visit Continue working with the Social Worker at Dr. Ronnald Ramp' office and continue attending your scheduled visits with Dr. Michail Sermon     Plan: Telephone follow up appointment with care  management team member scheduled for: Tuesday, January 06, 2022 at 11:30 am The patient has been provided with contact information for the care management team and has been advised to call with any health related questions or concerns   [name], RN, BSN, [place] [phone]: direct office

## 2021-12-03 ENCOUNTER — Telehealth: Payer: Self-pay | Admitting: Internal Medicine

## 2021-12-03 ENCOUNTER — Other Ambulatory Visit (HOSPITAL_COMMUNITY): Payer: Self-pay | Admitting: Nephrology

## 2021-12-03 DIAGNOSIS — N1832 Chronic kidney disease, stage 3b: Secondary | ICD-10-CM | POA: Diagnosis not present

## 2021-12-03 DIAGNOSIS — N2889 Other specified disorders of kidney and ureter: Secondary | ICD-10-CM

## 2021-12-03 DIAGNOSIS — I951 Orthostatic hypotension: Secondary | ICD-10-CM | POA: Diagnosis not present

## 2021-12-03 DIAGNOSIS — J45909 Unspecified asthma, uncomplicated: Secondary | ICD-10-CM | POA: Diagnosis not present

## 2021-12-03 LAB — CHROMOGRANIN A: Chromogranin A (ng/mL): 1757 ng/mL — ABNORMAL HIGH (ref 0.0–101.8)

## 2021-12-03 NOTE — Telephone Encounter (Signed)
Patient suffered a fall today at his home - patient received no injuries - getting up out of bed and patient got up to fast - patient just went down to his knees and wife helped him back in bed.

## 2021-12-04 ENCOUNTER — Telehealth: Payer: Self-pay | Admitting: Hematology

## 2021-12-04 ENCOUNTER — Ambulatory Visit (INDEPENDENT_AMBULATORY_CARE_PROVIDER_SITE_OTHER): Payer: Medicare Other | Admitting: Internal Medicine

## 2021-12-04 ENCOUNTER — Encounter: Payer: Self-pay | Admitting: Internal Medicine

## 2021-12-04 VITALS — BP 140/88 | HR 92 | Temp 98.1°F | Ht 73.0 in | Wt 136.0 lb

## 2021-12-04 DIAGNOSIS — D539 Nutritional anemia, unspecified: Secondary | ICD-10-CM | POA: Insufficient documentation

## 2021-12-04 DIAGNOSIS — I10 Essential (primary) hypertension: Secondary | ICD-10-CM

## 2021-12-04 LAB — FOLATE: Folate: >24.2 ng/mL (ref >5.9)

## 2021-12-04 LAB — VITAMIN B12: Vitamin B-12: 427 pg/mL (ref 211–911)

## 2021-12-04 NOTE — Progress Notes (Unsigned)
Subjective:  Patient ID: Alan [name], male    DOB: [birth year]  Age: 81 y.o. MRN: [id number]  CC: No chief complaint on file.   HPI Alan [name] presents for ***  Outpatient Medications Prior to Visit  Medication Sig Dispense Refill   albuterol (VENTOLIN HFA) 108 (90 Base) MCG/ACT inhaler Inhale 2 puffs into the lungs every 6 (six) hours as needed for wheezing or shortness of breath. 18 g 5   aspirin 81 MG EC tablet Take 81 mg by mouth daily. Swallow whole.     cholestyramine (QUESTRAN) 4 g packet MIX AND DRINK 1 PACKET(4 GRAMS) BY MOUTH TWICE DAILY WITH A MEAL 180 each 0   diphenoxylate-atropine (LOMOTIL) 2.5-0.025 MG tablet Take 1 tablet by mouth 2 (two) times daily as needed for diarrhea or loose stools. 30 tablet 0   dronabinol (MARINOL) 2.5 MG capsule Take 1 capsule (2.5 mg total) by mouth 2 (two) times daily before a meal. 60 capsule 0   feeding supplement, ENSURE COMPLETE, (ENSURE COMPLETE) LIQD Take 237 mLs by mouth 2 (two) times daily between meals.     fludrocortisone (FLORINEF) 0.1 MG tablet Take 1 tablet (0.1 mg total) by mouth daily. 90 tablet 3   iron polysaccharides (NIFEREX) 150 MG capsule TAKE 1 CAPSULE(150 MG) BY MOUTH 3 TIMES A WEEK 30 capsule 2   LANREOTIDE ACETATE [place] Inject 1 Dose into the skin every 30 (thirty) days.     loperamide (IMODIUM A-D) 2 MG capsule Take by mouth as needed for diarrhea or loose stools.     midodrine (PROAMATINE) 5 MG tablet Take 3 tablets (15 mg total) by mouth 3 (three) times daily with meals. 270 tablet 5   mirtazapine (REMERON) 15 MG tablet Take 1 tablet (15 mg total) by mouth daily. 90 tablet 3   Multiple Vitamins-Minerals (PRESERVISION AREDS 2 PO) Take 1 capsule by mouth daily.     multivitamin (ONE-A-DAY MEN'S) TABS tablet Take 1 tablet by mouth daily.     Potassium Chloride ER 20 MEQ TBCR Take 1 tablet by mouth 2 (two) times daily. 60 tablet 5   SYMBICORT 160-4.5 MCG/ACT inhaler Inhale 2 puffs into the lungs daily as needed  (wheezing). 3 each 1   No facility-administered medications prior to visit.    ROS Review of Systems  Constitutional:  Positive for fatigue and unexpected weight change.  HENT:  Positive for trouble swallowing. Negative for sore throat.   Respiratory:  Positive for choking and shortness of breath. Negative for cough, chest tightness and wheezing.   Cardiovascular:  Negative for chest pain, palpitations and leg swelling.  Gastrointestinal:  Positive for diarrhea. Negative for abdominal pain, constipation, nausea and vomiting.  Genitourinary: Negative.  Negative for difficulty urinating.  Musculoskeletal: Negative.   Skin: Negative.   Neurological:  Positive for dizziness, weakness and light-headedness.  Hematological:  Negative for adenopathy. Does not bruise/bleed easily.  Psychiatric/Behavioral: Negative.      Objective:  BP 140/88 (BP Location: Left Arm, Patient Position: Sitting, Cuff Size: Large)   Pulse 92   Temp 98.1 F (36.7 C) (Oral)   Ht '6\' 1"'$  (1.854 m)   Wt 136 lb (61.7 kg)   SpO2 95%   BMI 17.94 kg/m   BP Readings from Last 3 Encounters:  12/04/21 140/88  12/01/21 127/78  11/25/21 (!) 200/109    Wt Readings from Last 3 Encounters:  12/04/21 136 lb (61.7 kg)  11/25/21 141 lb 1.6 oz (64 kg)  11/10/21 144 lb (65.3  kg)    Physical Exam Vitals reviewed.  Constitutional:      Appearance: He is cachectic. He is ill-appearing. He is not toxic-appearing or diaphoretic.  HENT:     Mouth/Throat:     Mouth: Mucous membranes are moist.  Eyes:     General: No scleral icterus.    Conjunctiva/sclera: Conjunctivae normal.  Cardiovascular:     Rate and Rhythm: Normal rate and regular rhythm.     Heart sounds: No murmur heard. Pulmonary:     Effort: Pulmonary effort is normal.     Breath sounds: No stridor. No wheezing, rhonchi or rales.  Abdominal:     General: Abdomen is scaphoid. Bowel sounds are normal. There is no distension.     Palpations: There is no  hepatomegaly, splenomegaly or mass.     Tenderness: There is no abdominal tenderness. There is no guarding.  Musculoskeletal:        General: Normal range of motion.     Right lower leg: No edema.     Left lower leg: No edema.  Skin:    General: Skin is dry.  Neurological:     General: No focal deficit present.     Mental Status: He is alert.  Psychiatric:        Mood and Affect: Mood normal.        Behavior: Behavior normal.     Lab Results  Component Value Date   WBC 8.2 12/01/2021   HGB 10.2 (L) 12/01/2021   HCT 28.8 (L) 12/01/2021   PLT 190 12/01/2021   GLUCOSE 71 12/01/2021   ALT 13 12/01/2021   AST 13 (L) 12/01/2021   NA 143 12/01/2021   K 3.9 12/01/2021   CL 106 12/01/2021   CREATININE 2.49 (H) 12/01/2021   BUN 44 (H) 12/01/2021   CO2 27 12/01/2021   TSH 1.591 04/22/2021   INR 1.0 01/16/2021   HGBA1C 5.1 01/16/2021    US RENAL  Result Date: 11/19/2021 CLINICAL DATA:  Chronic kidney disease, stage IV EXAM: RENAL / URINARY TRACT ULTRASOUND COMPLETE COMPARISON:  04/16/2016, 08/07/2021. FINDINGS: Right Kidney: Renal measurements: 10.5 x 6.0 x 6.4 cm = volume: 208.3 mL. Increased parenchymal echogenicity. A cyst is present in the lower pole measuring 2.3 x 2.5 x 1.9 cm. There is a calculus in the lower pole measuring 1.5 cm. No hydronephrosis. Left Kidney: Renal measurements: 10.5 x 6.6 x 4.6 cm = volume: 165.6 mL. Increased parenchymal echogenicity. There is a cyst in the upper pole measuring 5.7 x 6.5 x 5.6 cm. There is a hypoechoic mass in the midpole measuring 2.6 x 2.2 x 2.5 cm with no internal vascularity. No renal calculus or hydronephrosis. Bladder: Appears normal for degree of bladder distention. The prostate gland is enlarged measuring 5.3 x 4.3 x 5.6 cm, total volume 67.2 mL. Other: There is a trace amount of perihepatic free fluid IMPRESSION: 1. Increased renal parenchymal echogenicity bilaterally, compatible with history of medical renal disease. 2. 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 Multiphase CT or MRI is recommended for further evaluation. 3. Bilateral renal cysts. 4. Right renal calculus. 5. Enlarged prostate gland. Electronically Signed   By: Brett Fairy M.D.   On: 11/19/2021 04:18    Assessment & Plan:   Diagnoses and all orders for this visit:  Deficiency anemia -     Vitamin B1; Future -     Folate; Future -     Vitamin B12; Future -  Vitamin B12 -     Folate -     Vitamin B1   I am having Josie Saunders. Skora maintain his Multiple Vitamins-Minerals (PRESERVISION AREDS 2 PO), aspirin EC, LANREOTIDE ACETATE [place], multivitamin, fludrocortisone, Symbicort, albuterol, iron polysaccharides, feeding supplement (ENSURE COMPLETE), dronabinol, loperamide, Potassium Chloride ER, midodrine, mirtazapine, diphenoxylate-atropine, and cholestyramine.  No orders of the defined types were placed in this encounter.    Follow-up: Return in about 6 months (around 06/05/2022).  Scarlette Calico, MD

## 2021-12-04 NOTE — Telephone Encounter (Signed)
Left message with follow-up appointments per 5/30 los.

## 2021-12-04 NOTE — Patient Instructions (Signed)
Anemia  Anemia is a condition in which there is not enough red blood cells or hemoglobin in the blood. Hemoglobin is a substance in red blood cells that carries oxygen. When you do not have enough red blood cells or hemoglobin (are anemic), your body cannot get enough oxygen and your organs may not work properly. As a result, you may feel very tired or have other problems. What are the causes? Common causes of anemia include: Excessive bleeding. Anemia can be caused by excessive bleeding inside or outside the body, including bleeding from the intestines or from heavy menstrual periods in females. Poor nutrition. Long-lasting (chronic) kidney, thyroid, and liver disease. Bone marrow disorders, spleen problems, and blood disorders. Cancer and treatments for cancer. HIV (human immunodeficiency virus) and AIDS (acquired immunodeficiency syndrome). Infections, medicines, and autoimmune disorders that destroy red blood cells. What are the signs or symptoms? Symptoms of this condition include: Minor weakness. Dizziness. Headache, or difficulties concentrating and sleeping. Heartbeats that feel irregular or faster than normal (palpitations). Shortness of breath, especially with exercise. Pale skin, lips, and nails, or cold hands and feet. Indigestion and nausea. Symptoms may occur suddenly or develop slowly. If your anemia is mild, you may not have symptoms. How is this diagnosed? This condition is diagnosed based on blood tests, your medical history, and a physical exam. In some cases, a test may be needed in which cells are removed from the soft tissue inside of a bone and looked at under a microscope (bone marrow biopsy). Your health care provider may also check your stool (feces) for blood and may do additional testing to look for the cause of your bleeding. Other tests may include: Imaging tests, such as a CT scan or MRI. A procedure to see inside your esophagus and stomach (endoscopy). A  procedure to see inside your colon and rectum (colonoscopy). How is this treated? Treatment for this condition depends on the cause. If you continue to lose a lot of blood, you may need to be treated at a hospital. Treatment may include: Taking supplements of iron, vitamin B12, or folic acid. Taking a hormone medicine (erythropoietin) that can help to stimulate red blood cell growth. Having a blood transfusion. This may be needed if you lose a lot of blood. Making changes to your diet. Having surgery to remove your spleen. Follow these instructions at home: Take over-the-counter and prescription medicines only as told by your health care provider. Take supplements only as told by your health care provider. Follow any diet instructions that you were given by your health care provider. Keep all follow-up visits as told by your health care provider. This is important. Contact a health care provider if: You develop new bleeding anywhere in the body. Get help right away if: You are very weak. You are short of breath. You have pain in your abdomen or chest. You are dizzy or feel faint. You have trouble concentrating. You have bloody stools, black stools, or tarry stools. You vomit repeatedly or you vomit up blood. These symptoms may represent a serious problem that is an emergency. Do not wait to see if the symptoms will go away. Get medical help right away. Call your local emergency services (911 in the [place] Do not drive yourself to the hospital. Summary Anemia is a condition in which you do not have enough red blood cells or enough of a substance in your red blood cells that carries oxygen (hemoglobin). Symptoms may occur suddenly or develop slowly. If your anemia   is mild, you may not have symptoms. This condition is diagnosed with blood tests, a medical history, and a physical exam. Other tests may be needed. Treatment for this condition depends on the cause of the anemia. This  information is not intended to replace advice given to you by your health care provider. Make sure you discuss any questions you have with your health care provider. Document Revised: 04/29/2021 Document Reviewed: 05/23/2019 Elsevier Patient Education  2023 [employer]  

## 2021-12-05 ENCOUNTER — Telehealth: Payer: Self-pay | Admitting: Hematology

## 2021-12-05 NOTE — Telephone Encounter (Signed)
Rescheduled appointment per [facility]. Left message with appointment details.

## 2021-12-08 DIAGNOSIS — R197 Diarrhea, unspecified: Secondary | ICD-10-CM | POA: Diagnosis not present

## 2021-12-09 ENCOUNTER — Ambulatory Visit (INDEPENDENT_AMBULATORY_CARE_PROVIDER_SITE_OTHER): Payer: Medicare Other | Admitting: Psychologist

## 2021-12-09 ENCOUNTER — Ambulatory Visit: Payer: Medicare Other | Admitting: Internal Medicine

## 2021-12-09 ENCOUNTER — Telehealth: Payer: Self-pay | Admitting: Internal Medicine

## 2021-12-09 DIAGNOSIS — I951 Orthostatic hypotension: Secondary | ICD-10-CM | POA: Diagnosis not present

## 2021-12-09 DIAGNOSIS — F411 Generalized anxiety disorder: Secondary | ICD-10-CM

## 2021-12-09 DIAGNOSIS — F331 Major depressive disorder, recurrent, moderate: Secondary | ICD-10-CM

## 2021-12-09 DIAGNOSIS — J45909 Unspecified asthma, uncomplicated: Secondary | ICD-10-CM | POA: Diagnosis not present

## 2021-12-09 DIAGNOSIS — N1832 Chronic kidney disease, stage 3b: Secondary | ICD-10-CM | POA: Diagnosis not present

## 2021-12-09 NOTE — Progress Notes (Signed)
[facility] Counselor/Therapist Progress Note  Patient ID: Alan [name], MRN: [id number],    Date: 12/09/2021  Time Spent: 1:10 pm to 1:48 pm; total time: 38 minutes   This session was held via in person. The patient consented to in-person therapy and was in the clinician's office. Limits of confidentiality were discussed with the patient.   Treatment Type: Individual Therapy  Reported Symptoms: Anxiety related to fear of the unknown, and other frustrations  Mental Status Exam: Appearance:  Well Groomed     Behavior: Appropriate  Motor: Normal  Speech/Language:  Clear and Coherent  Affect: Appropriate  Mood: normal  Thought process: normal  Thought content:   WNL  Sensory/Perceptual disturbances:   WNL  Orientation: oriented to person, place, and time/date  Attention: Good  Concentration: Good  Memory: WNL  Fund of knowledge:  Good  Insight:   Fair  Judgment:  Fair  Impulse Control: Good   Risk Assessment: Danger to Self:  No Self-injurious Behavior: No Danger to Others: No Duty to Warn:no Physical Aggression / Violence:No  Access to Firearms a concern: No  Gang Involvement:No   Subjective: Beginning the session, patient described himself as fair while reflecting on events since the last session. 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 Per the patient, he has limited medical options until after the scan is completed to learn more information. He spent time reflecting on frustrations with some of his medical providers and his perception that they are not providing the quality of care that he wants. He processed his thoughts and emotions related to this idea. Continuing to talk, he voiced that he met with palliative care and found it helpful. Elaborating, he identified with fears of dying. Specifically, what happens when he dies and how he dies. He talked about living videos for grandchildren.  He spent time talking about having a quality of life and explored what this looks like to him. He ended the session exploring ways to express love to his wife. He asked to follow up. He denied suicidal and homicidal ideation.    Interventions:  Worked on developing a therapeutic relationship with the patient using active listening and reflective statement. Provided emotional support using empathy and validation. Used summary statements. Praised the patient for doing fair. Explored what has assisted the patient. Processed some of the frustrations patient expressed related to having to wait to get in for a scan. Identified several different themes related to frustrations in the health care system. Used summary and reflective statements. Normalized and validated those emotions. Used socratic questions to assist the patient gain insight into self. Reviewed meeting with palliative care. Identified themes of fear of the unknown. Processed what that looks like to the patient. Explored ways to live a quality life. Assisted in problem solving. Explored ways that patient could express love to his wife. Provided psychoeducation about legacy videos and explored the idea of creating videos for the grandchildren. Reflected on coping strategies. Explored what steps patient was taking to keep self safe while wife was away on vacation. Validated thoughts and emotions.  Provided empathic statements. Assessed for suicidal and homicidal ideation.   Homework: NA  Next Session: Emotional support. Process dignity of life.   Diagnosis: F41.1 generalized anxiety disorder and F33.1 major depressive affective disorder, recurrent, moderate   Plan:   Client Abilities: Friendly and easy to develop rapport  Client Preferences: ACT and  CBT  Client statement of Needs: Coping skills and emotional support  Treatment Level:Outpatient   Goals Alleviate depressive symptoms Recognize, accept, and cope with depressive  feelings Develop healthy thinking patterns Develop healthy interpersonal relationships Reduce overall frequency, intensity, and duration of anxiety Stabilize anxiety level wile increasing ability to function Enhance ability to effectively cope with full variety of stressors Learn and implement coping skills that result in a reduction of anxiety   Objectives target date for all objectives is 08/19/2022 Verbalize an understanding of the cognitive, physiological, and behavioral components of anxiety Learning and implement calming skills to reduce overall anxiety Verbalize an understanding of the role that cognitive biases play in excessive irrational worry and persistent anxiety symptoms Identify, challenge, and replace based fearful talk Learn and implement problem solving strategies Identify and engage in pleasant activities Learning and implement personal and interpersonal skills to reduce anxiety and improve interpersonal relationships Learn to accept limitations in life and commit to tolerating, rather than avoiding, unpleasant emotions while accomplishing meaningful goals Identify major life conflicts from the past and present that form the basis for present anxiety Maintain involvement in work, family, and social activities Reestablish a consistent sleep-wake cycle Cooperate with a medical evaluation  Cooperate with a medication evaluation by a physician Verbalize an accurate understanding of depression Verbalize an understanding of the treatment Identify and replace thoughts that support depression Learn and implement behavioral strategies Verbalize an understanding and resolution of current interpersonal problems Learn and implement problem solving and decision making skills Learn and implement conflict resolution skills to resolve interpersonal problems Verbalize an understanding of healthy and unhealthy emotions verbalize insight into how past relationships may be influence  current experiences with depression Use mindfulness and acceptance strategies and increase value based behavior  Increase hopeful statements about the future.  Interventions Engage the patient in behavioral activation Use instruction, modeling, and role-playing to build the client's general social, communication, and/or conflict resolution skills Use Acceptance and Commitment Therapy to help client accept uncomfortable realities in order to accomplish value-consistent goals Reinforce the client's insight into the role of his/her past emotional pain and present anxiety  Support the client in following through with work, family, and social activities Teach and implement sleep hygiene practices  Refer the patient to a physician for a psychotropic medication consultation Monito the clint's psychotropic medication compliance Discuss how anxiety typically involves excessive worry, various bodily expressions of tension, and avoidance of what is threatening that interact to maintain the problem  Teach the patient relaxation skills Assign the patient homework Discuss examples demonstrating that unrealistic worry overestimates the probability of threats and underestimates patient's ability  Assist the patient in analyzing his or her worries Help patient understand that avoidance is reinforcing  Consistent with treatment model, discuss how change in cognitive, behavioral, and interpersonal can help client alleviate depression CBT Behavioral activation help the client explore the relationship, nature of the dispute,  Help the client develop new interpersonal skills and relationships Conduct Problem solving therapy Teach conflict resolution skills Use a process-experiential approach Conduct TLDP Conduct ACT Evaluate need for psychotropic medication Monitor adherence to medication   The patient and clinician reviewed the treatment plan on 09/09/2021. The patient approved of the treatment plan.     Conception Chancy, PsyD

## 2021-12-09 NOTE — Telephone Encounter (Signed)
Alan [name] with [name] calls today with an FYI regarding the PT's blood pressure. Blood pressure currently reading 172/98. They are aware that BP is adjusted to stay high due to orthostatic hypotension (but are required to let PCP know if they get any abnormal readings).  CB if needed: [phone]

## 2021-12-10 ENCOUNTER — Other Ambulatory Visit: Payer: Self-pay | Admitting: Internal Medicine

## 2021-12-10 ENCOUNTER — Telehealth: Payer: Self-pay | Admitting: Internal Medicine

## 2021-12-10 DIAGNOSIS — I951 Orthostatic hypotension: Secondary | ICD-10-CM | POA: Diagnosis not present

## 2021-12-10 DIAGNOSIS — N1832 Chronic kidney disease, stage 3b: Secondary | ICD-10-CM | POA: Diagnosis not present

## 2021-12-10 DIAGNOSIS — J45909 Unspecified asthma, uncomplicated: Secondary | ICD-10-CM | POA: Diagnosis not present

## 2021-12-10 DIAGNOSIS — E519 Thiamine deficiency, unspecified: Secondary | ICD-10-CM

## 2021-12-10 DIAGNOSIS — D538 Other specified nutritional anemias: Secondary | ICD-10-CM

## 2021-12-10 LAB — VITAMIN B1: Vitamin B1 (Thiamine): 8 nmol/L (ref 8–30)

## 2021-12-10 MED ORDER — VITAMIN B-1 50 MG PO TABS: 50.0000 mg | ORAL_TABLET | Freq: Every day | ORAL | 1 refills | Status: AC

## 2021-12-10 NOTE — Telephone Encounter (Signed)
Verbal orders given for [facility] to assist with showering.   Alan [name] is requesting a PCP to enter referral for neurology due to pt passing out. Please advise.

## 2021-12-10 NOTE — Telephone Encounter (Signed)
Angel called from [facility] requesting [place]  orders for  an aide  to help showering for 1xweekly for 2 weeks.  Also requesting neurology consult as the pateint fainted twice yesterday  patient fell in the home and in the doctors office and pt. was out for 5 mins

## 2021-12-11 ENCOUNTER — Telehealth: Payer: Self-pay | Admitting: Internal Medicine

## 2021-12-11 NOTE — Telephone Encounter (Signed)
Gave pt wife the result note Dr. Ronnald Ramp provided "Pls let him know that his Vit B 1 level is low Ask him to start Vit B1 50 mg QD I think this will help with his symptoms". Also provided rx information that was sent in to [facility].  Fyi

## 2021-12-12 ENCOUNTER — Ambulatory Visit: Payer: Medicare Other | Admitting: Hematology

## 2021-12-12 ENCOUNTER — Other Ambulatory Visit: Payer: Medicare Other

## 2021-12-15 ENCOUNTER — Ambulatory Visit: Payer: Medicare Other | Admitting: Licensed Clinical Social Worker

## 2021-12-15 ENCOUNTER — Encounter: Payer: Self-pay | Admitting: Internal Medicine

## 2021-12-15 DIAGNOSIS — F329 Major depressive disorder, single episode, unspecified: Secondary | ICD-10-CM

## 2021-12-15 DIAGNOSIS — I10 Essential (primary) hypertension: Secondary | ICD-10-CM

## 2021-12-15 NOTE — Chronic Care Management (AMB) (Signed)
Chronic Care Management   Clinical Social Work Note  12/15/2021 Name: Sheddrick Lattanzio Lamos MRN: [id number] DOB: 03-Jun-1941  Josie Saunders Mcnaught is a 81 y.o. year old male who is a primary care patient of [name], MD. The CCM team was consulted to assist the patient with chronic disease management and/or care coordination needs related to: [place] and Resources.   Engaged with patient by telephone for follow up visit in response to provider referral for social work chronic care management and care coordination services.   Consent to Services:  The patient was given information about Chronic Care Management services, agreed to services, and gave verbal consent prior to initiation of services.  Please see initial visit note for detailed documentation.   Patient agreed to services and consent obtained.   Summary: Assessed patient's current treatment, progress, coping skills, support system and barriers to care.  Patient was accompanied by his wife who provided information during this encounter.Marland KitchenHe is connected for therapy and is no longer interested in connecting for psychiatry at this time. Wife express interested in private pay options for personal care needs   See Care Plan below for interventions and patient self-care actives.  Recommendation: Patient may benefit from, and wife is in agreement to call agencies on list provided.   Follow up Plan: All care plan goals have been met. Will disconnect from care team after this encounter. Patient has been informed to contact the office if new needs arise.   Assessment: Review of patient past medical history, allergies, medications, and health status, including review of relevant consultants reports was performed today as part of a comprehensive evaluation and provision of chronic care management and care coordination services.     SDOH (Social Determinants of Health) assessments and interventions performed:    Advanced Directives  Status: Not addressed in this encounter.  CCM Care Plan Conditions to be addressed/monitored: ; Level of care concerns  Care Plan : LCSW Plan of Care  Updates made by Maurine Cane, LCSW since 12/15/2021 12:00 AM     Problem: Coping Skills unmanaged      Goal: Coping Skills Enhanced by connecting with ongoing therapy Completed 12/15/2021  Start Date: 07/15/2021  Expected End Date: 11/25/2021  Recent Progress: On track  Priority: High  Note:   Current Barriers:  Disease Management support and education needs related to managing symptoms of depression Lacks knowledge of how to connect   [place]):  Patient  will work with [place] to address needs related to depression  through collaboration with Clinical Education officer, museum, provider, and care team.   Interventions: 1:1 collaboration with primary care provider regarding development and update of comprehensive plan of care as evidenced by provider attestation and co-signature Inter-disciplinary care team collaboration (see longitudinal plan of care) Evaluation of current treatment plan related to  self management and patient's adherence to plan as established by provider Review resources, discussed options and provided patient information about  Options for mental health treatment based on need and insurance  Mental Health:  (Status: Goal on Track (progressing): YES. Goal Met.) Evaluation of current treatment plan related to Depression: depressed mood anxiety Solution-Focused Strategies employed:  Problem Burns strategies reviewed Discussed referral for psychiatry: no longer interested; per patient and wife PCP prescribes medication   Patient Self-Care Activities: Keep therapy appointment with [facility] health list e-mailed for private pay personal care options       Casimer Lanius, LCSW  Licensed Clinical Social Worker Warehouse manager Primary Care [place]  [phone]

## 2021-12-15 NOTE — Patient Instructions (Signed)
Visit Information  Thank you for taking time to visit with me today. Please don't hesitate to contact me if I can be of assistance to you before our next scheduled telephone appointment.  Following are the goals we discussed today:  Patient Self-Care Activities: Keep therapy appointment with [facility] list e-mailed for private pay personal care options I have also added them below.       Personal Care Services private pay options.  [facility] [phone]   [name] [place] Only)    [phone]    [place]    Casimer Lanius, [place] Licensed Clinical Social Worker [facility]  [place]  [phone]   No follow up scheduled, per our conversation you do not require continued follow up I will disconnect from your care team at this time, please call the office if additional needs are identified  Please call the care guide team at [phone] if you need to cancel or reschedule your appointment.   If you are experiencing a Mental Health or [place] or need someone to talk to, please call 1-800-273-TALK (toll free, 24 hour hotline)   Patient verbalizes understanding of instructions and care plan provided today and agrees to view in [place]. Active MyChart status and patient understanding of how to access instructions and care plan via MyChart confirmed with patient.

## 2021-12-16 ENCOUNTER — Encounter: Payer: Self-pay | Admitting: Neurology

## 2021-12-16 ENCOUNTER — Other Ambulatory Visit: Payer: Self-pay | Admitting: Internal Medicine

## 2021-12-16 DIAGNOSIS — J45909 Unspecified asthma, uncomplicated: Secondary | ICD-10-CM | POA: Diagnosis not present

## 2021-12-16 DIAGNOSIS — N1831 Chronic kidney disease, stage 3a: Secondary | ICD-10-CM

## 2021-12-16 DIAGNOSIS — I951 Orthostatic hypotension: Secondary | ICD-10-CM | POA: Diagnosis not present

## 2021-12-16 DIAGNOSIS — C7A019 Malignant carcinoid tumor of the small intestine, unspecified portion: Secondary | ICD-10-CM

## 2021-12-16 DIAGNOSIS — N1832 Chronic kidney disease, stage 3b: Secondary | ICD-10-CM | POA: Diagnosis not present

## 2021-12-16 DIAGNOSIS — R55 Syncope and collapse: Secondary | ICD-10-CM

## 2021-12-16 NOTE — Telephone Encounter (Signed)
Deirdre the [facility] nurse from [place] called and stated that they would like advice on what to do. Deirdre doesn't feel comfortable with the patient waiting until 01/09/22 for an appointment with neurology. She would like to know if there is anything that Dr. Ronnald Ramp suggests and if not, she would like a referral somewhere else for a possible sooner appointment.   Deirdre is requesting a callback at [phone]

## 2021-12-17 DIAGNOSIS — N1832 Chronic kidney disease, stage 3b: Secondary | ICD-10-CM | POA: Diagnosis not present

## 2021-12-17 DIAGNOSIS — C7A019 Malignant carcinoid tumor of the small intestine, unspecified portion: Secondary | ICD-10-CM | POA: Diagnosis not present

## 2021-12-18 ENCOUNTER — Telehealth: Payer: Self-pay | Admitting: Internal Medicine

## 2021-12-18 DIAGNOSIS — J45909 Unspecified asthma, uncomplicated: Secondary | ICD-10-CM | POA: Diagnosis not present

## 2021-12-18 DIAGNOSIS — I951 Orthostatic hypotension: Secondary | ICD-10-CM | POA: Diagnosis not present

## 2021-12-18 DIAGNOSIS — N1832 Chronic kidney disease, stage 3b: Secondary | ICD-10-CM | POA: Diagnosis not present

## 2021-12-18 NOTE — Telephone Encounter (Signed)
Angel from [facility] called to report Alan [name] had a fall.   Fyi

## 2021-12-19 DIAGNOSIS — N1832 Chronic kidney disease, stage 3b: Secondary | ICD-10-CM | POA: Diagnosis not present

## 2021-12-19 DIAGNOSIS — J45909 Unspecified asthma, uncomplicated: Secondary | ICD-10-CM | POA: Diagnosis not present

## 2021-12-19 DIAGNOSIS — I951 Orthostatic hypotension: Secondary | ICD-10-CM | POA: Diagnosis not present

## 2021-12-19 NOTE — Telephone Encounter (Signed)
[facility] Nurse called again today to report that patient continues to have syncopal episodes. This nurse spoke with patient and was advised that he refuses to seek emergency medical attention. Patient states that nothing can be changed regarding his diagnosis and that he refuses to spend the extra money going to the hospital when nothing can be done. The patient will continue to wait for his appointment with Neurology scheduled for July 14 th. Patient and wife both agreed that if s/s were to worsen or if a fall resulted in injury they would call 911 for assistance.

## 2021-12-22 ENCOUNTER — Telehealth: Payer: Self-pay

## 2021-12-22 ENCOUNTER — Telehealth: Payer: Self-pay | Admitting: Internal Medicine

## 2021-12-22 DIAGNOSIS — I951 Orthostatic hypotension: Secondary | ICD-10-CM

## 2021-12-22 DIAGNOSIS — R55 Syncope and collapse: Secondary | ICD-10-CM

## 2021-12-22 NOTE — Telephone Encounter (Signed)
Kenny[facility] called and states hospice referral was sent- but family is just wanting palliative care.   Please advise.

## 2021-12-23 ENCOUNTER — Telehealth: Payer: Self-pay

## 2021-12-23 DIAGNOSIS — N1832 Chronic kidney disease, stage 3b: Secondary | ICD-10-CM | POA: Diagnosis not present

## 2021-12-23 DIAGNOSIS — J45909 Unspecified asthma, uncomplicated: Secondary | ICD-10-CM | POA: Diagnosis not present

## 2021-12-23 DIAGNOSIS — I951 Orthostatic hypotension: Secondary | ICD-10-CM | POA: Diagnosis not present

## 2021-12-24 DIAGNOSIS — J45909 Unspecified asthma, uncomplicated: Secondary | ICD-10-CM | POA: Diagnosis not present

## 2021-12-24 DIAGNOSIS — N1832 Chronic kidney disease, stage 3b: Secondary | ICD-10-CM | POA: Diagnosis not present

## 2021-12-24 DIAGNOSIS — I951 Orthostatic hypotension: Secondary | ICD-10-CM | POA: Diagnosis not present

## 2021-12-26 ENCOUNTER — Encounter: Payer: Self-pay | Admitting: Internal Medicine

## 2021-12-26 ENCOUNTER — Other Ambulatory Visit: Payer: Self-pay

## 2021-12-26 DIAGNOSIS — C7A019 Malignant carcinoid tumor of the small intestine, unspecified portion: Secondary | ICD-10-CM

## 2021-12-26 DIAGNOSIS — I10 Essential (primary) hypertension: Secondary | ICD-10-CM

## 2021-12-26 DIAGNOSIS — F329 Major depressive disorder, single episode, unspecified: Secondary | ICD-10-CM

## 2021-12-26 DIAGNOSIS — I4891 Unspecified atrial fibrillation: Secondary | ICD-10-CM

## 2021-12-26 NOTE — Telephone Encounter (Signed)
Please see the MyChart message reply(ies) for my assessment and plan.  The patient gave consent for this Medical Advice Message and is aware that it may result in a bill to their insurance company as well as the possibility that this may result in a co-payment or deductible. They are an established patient, but are not seeking medical advice exclusively about a problem treated during an in person or video visit in the last 7 days. I did not recommend an in person or video visit within 7 days of my reply.  I spent a total of 6 minutes cumulative time within 7 days through MyChart messaging Steele Stracener, MD  

## 2021-12-29 ENCOUNTER — Inpatient Hospital Stay: Payer: Medicare Other | Attending: Hematology

## 2021-12-29 ENCOUNTER — Inpatient Hospital Stay: Payer: Medicare Other

## 2021-12-29 ENCOUNTER — Other Ambulatory Visit: Payer: Self-pay

## 2021-12-29 VITALS — BP 172/119 | HR 77 | Temp 97.9°F | Resp 18

## 2021-12-29 DIAGNOSIS — C7A019 Malignant carcinoid tumor of the small intestine, unspecified portion: Secondary | ICD-10-CM | POA: Insufficient documentation

## 2021-12-29 LAB — CMP (CANCER CENTER ONLY)
ALT: 11 U/L (ref 0–44)
AST: 13 U/L — ABNORMAL LOW (ref 15–41)
Albumin: 4 g/dL (ref 3.5–5.0)
Alkaline Phosphatase: 49 U/L (ref 38–126)
Anion gap: 7 (ref 5–15)
BUN: 39 mg/dL — ABNORMAL HIGH (ref 8–23)
CO2: 24 mmol/L (ref 22–32)
Calcium: 9.4 mg/dL (ref 8.9–10.3)
Chloride: 107 mmol/L (ref 98–111)
Creatinine: 2.68 mg/dL — ABNORMAL HIGH (ref 0.61–1.24)
GFR, Estimated: 23 mL/min — ABNORMAL LOW (ref >60)
Glucose, Bld: 98 mg/dL (ref 70–99)
Potassium: 3.8 mmol/L (ref 3.5–5.1)
Sodium: 138 mmol/L (ref 135–145)
Total Bilirubin: 0.7 mg/dL (ref 0.3–1.2)
Total Protein: 7 g/dL (ref 6.5–8.1)

## 2021-12-29 LAB — CBC WITH DIFFERENTIAL (CANCER CENTER ONLY)
Abs Immature Granulocytes: 0.01 10*3/uL (ref 0.00–0.07)
Basophils Absolute: 0 10*3/uL (ref 0.0–0.1)
Basophils Relative: 1 %
Eosinophils Absolute: 0 10*3/uL (ref 0.0–0.5)
Eosinophils Relative: 1 %
HCT: 28.7 % — ABNORMAL LOW (ref 39.0–52.0)
Hemoglobin: 10.6 g/dL — ABNORMAL LOW (ref 13.0–17.0)
Immature Granulocytes: 0 %
Lymphocytes Relative: 28 %
Lymphs Abs: 1.7 10*3/uL (ref 0.7–4.0)
MCH: 31.8 pg (ref 26.0–34.0)
MCHC: 36.9 g/dL — ABNORMAL HIGH (ref 30.0–36.0)
MCV: 86.2 fL (ref 80.0–100.0)
Monocytes Absolute: 0.5 10*3/uL (ref 0.1–1.0)
Monocytes Relative: 9 %
Neutro Abs: 3.8 10*3/uL (ref 1.7–7.7)
Neutrophils Relative %: 61 %
Platelet Count: 170 10*3/uL (ref 150–400)
RBC: 3.33 MIL/uL — ABNORMAL LOW (ref 4.22–5.81)
RDW: 12.9 % (ref 11.5–15.5)
WBC Count: 6 10*3/uL (ref 4.0–10.5)
nRBC: 0 % (ref 0.0–0.2)

## 2021-12-29 LAB — IRON AND IRON BINDING CAPACITY (CC-WL,HP ONLY)
Iron: 80 ug/dL (ref 45–182)
Saturation Ratios: 38 % (ref 17.9–39.5)
TIBC: 209 ug/dL — ABNORMAL LOW (ref 250–450)
UIBC: 129 ug/dL (ref 117–376)

## 2021-12-29 MED ORDER — LANREOTIDE ACETATE 120 MG/0.5ML ~~LOC~~ SOLN
120.0000 mg | Freq: Once | SUBCUTANEOUS | Status: AC
  Administered 2021-12-29: 120 mg via SUBCUTANEOUS
  Filled 2021-12-29: qty 120

## 2021-12-29 NOTE — Progress Notes (Signed)
Patient on meds to increase his B/P due to orthostatic hypertension

## 2021-12-30 DIAGNOSIS — I951 Orthostatic hypotension: Secondary | ICD-10-CM | POA: Diagnosis not present

## 2021-12-30 DIAGNOSIS — J45909 Unspecified asthma, uncomplicated: Secondary | ICD-10-CM | POA: Diagnosis not present

## 2021-12-30 DIAGNOSIS — N1832 Chronic kidney disease, stage 3b: Secondary | ICD-10-CM | POA: Diagnosis not present

## 2021-12-31 ENCOUNTER — Other Ambulatory Visit: Payer: Self-pay | Admitting: Internal Medicine

## 2021-12-31 ENCOUNTER — Telehealth: Payer: Self-pay | Admitting: Internal Medicine

## 2021-12-31 DIAGNOSIS — S51812A Laceration without foreign body of left forearm, initial encounter: Secondary | ICD-10-CM | POA: Insufficient documentation

## 2021-12-31 DIAGNOSIS — S51812D Laceration without foreign body of left forearm, subsequent encounter: Secondary | ICD-10-CM

## 2021-12-31 LAB — FERRITIN: Ferritin: 123 ng/mL (ref 24–336)

## 2021-12-31 NOTE — Telephone Encounter (Signed)
Glenard Haring, RN with [name] is calling to report that patient fell yesterday (7/4). He did not hit his head and the only injuries he reports are skin tears on L arm. She is requesting wound care supplies for the skin tears. Call back number is [phone].

## 2022-01-01 DIAGNOSIS — J45909 Unspecified asthma, uncomplicated: Secondary | ICD-10-CM | POA: Diagnosis not present

## 2022-01-01 DIAGNOSIS — N1832 Chronic kidney disease, stage 3b: Secondary | ICD-10-CM | POA: Diagnosis not present

## 2022-01-01 DIAGNOSIS — I951 Orthostatic hypotension: Secondary | ICD-10-CM | POA: Diagnosis not present

## 2022-01-01 LAB — CHROMOGRANIN A: Chromogranin A (ng/mL): 1994 ng/mL — ABNORMAL HIGH (ref 0.0–101.8)

## 2022-01-02 ENCOUNTER — Inpatient Hospital Stay: Payer: Medicare Other

## 2022-01-02 DIAGNOSIS — S42031A Displaced fracture of lateral end of right clavicle, initial encounter for closed fracture: Secondary | ICD-10-CM | POA: Diagnosis not present

## 2022-01-05 ENCOUNTER — Inpatient Hospital Stay: Payer: Medicare Other

## 2022-01-06 ENCOUNTER — Telehealth: Payer: Medicare Other

## 2022-01-07 ENCOUNTER — Inpatient Hospital Stay: Payer: Medicare Other

## 2022-01-07 ENCOUNTER — Ambulatory Visit (INDEPENDENT_AMBULATORY_CARE_PROVIDER_SITE_OTHER): Payer: Medicare Other | Admitting: *Deleted

## 2022-01-07 DIAGNOSIS — I4891 Unspecified atrial fibrillation: Secondary | ICD-10-CM

## 2022-01-07 DIAGNOSIS — J45909 Unspecified asthma, uncomplicated: Secondary | ICD-10-CM | POA: Diagnosis not present

## 2022-01-07 DIAGNOSIS — I951 Orthostatic hypotension: Secondary | ICD-10-CM

## 2022-01-07 DIAGNOSIS — N1832 Chronic kidney disease, stage 3b: Secondary | ICD-10-CM | POA: Diagnosis not present

## 2022-01-07 NOTE — Chronic Care Management (AMB) (Signed)
Chronic Care Management   CCM RN Visit Note  01/07/2022 Name: Alan [name] MRN: [id number] DOB: [birth year]  Subjective: Alan [name] is a 81 y.o. year old male who is a primary care patient of [name], MD. The care management team was consulted for assistance with disease management and care coordination needs.    Engaged with patient by telephone for follow up visit/ CCM RN CM case closure in response to provider referral for case management and/or care coordination services.   Consent to Services:  The patient was given information about Chronic Care Management services, agreed to services, and gave verbal consent prior to initiation of services.  Please see initial visit note for detailed documentation.  Patient agreed to services and verbal consent obtained.   Assessment: Review of patient past medical history, allergies, medications, health status, including review of consultants reports, laboratory and other test data, was performed as part of comprehensive evaluation and provision of chronic care management services.   SDOH (Social Determinants of Health) assessments and interventions performed:  SDOH Interventions    Flowsheet Row Most Recent Value  SDOH Interventions   Food Insecurity Interventions Intervention Not Indicated  [continues to deny food insecurity]  Transportation Interventions Intervention Not Indicated  [spouse/ caregiver continues to provide transportation for patient]     CCM Care Plan  Allergies  Allergen Reactions   Penicillins Hives and Shortness Of Breath   Lisinopril Itching   Outpatient Encounter Medications as of 01/07/2022  Medication Sig   albuterol (VENTOLIN HFA) 108 (90 Base) MCG/ACT inhaler Inhale 2 puffs into the lungs every 6 (six) hours as needed for wheezing or shortness of breath.   aspirin 81 MG EC tablet Take 81 mg by mouth daily. Swallow whole.   cholestyramine (QUESTRAN) 4 g packet MIX AND DRINK 1 PACKET(4 GRAMS) BY MOUTH  TWICE DAILY WITH A MEAL   diphenoxylate-atropine (LOMOTIL) 2.5-0.025 MG tablet Take 1 tablet by mouth 2 (two) times daily as needed for diarrhea or loose stools.   dronabinol (MARINOL) 2.5 MG capsule Take 1 capsule (2.5 mg total) by mouth 2 (two) times daily before a meal.   feeding supplement, ENSURE COMPLETE, (ENSURE COMPLETE) LIQD Take 237 mLs by mouth 2 (two) times daily between meals.   fludrocortisone (FLORINEF) 0.1 MG tablet Take 1 tablet (0.1 mg total) by mouth daily.   iron polysaccharides (NIFEREX) 150 MG capsule TAKE 1 CAPSULE(150 MG) BY MOUTH 3 TIMES A WEEK   LANREOTIDE ACETATE [place] Inject 1 Dose into the skin every 30 (thirty) days.   loperamide (IMODIUM A-D) 2 MG capsule Take by mouth as needed for diarrhea or loose stools.   midodrine (PROAMATINE) 5 MG tablet Take 3 tablets (15 mg total) by mouth 3 (three) times daily with meals.   mirtazapine (REMERON) 15 MG tablet Take 1 tablet (15 mg total) by mouth daily.   Multiple Vitamins-Minerals (PRESERVISION AREDS 2 PO) Take 1 capsule by mouth daily.   multivitamin (ONE-A-DAY MEN'S) TABS tablet Take 1 tablet by mouth daily.   Potassium Chloride ER 20 MEQ TBCR Take 1 tablet by mouth 2 (two) times daily.   SYMBICORT 160-4.5 MCG/ACT inhaler Inhale 2 puffs into the lungs daily as needed (wheezing).   thiamine (VITAMIN B-1) 50 MG tablet Take 1 tablet (50 mg total) by mouth daily.   No facility-administered encounter medications on file as of 01/07/2022.   Patient Active Problem List   Diagnosis Date Noted   Skin tear of left forearm without complication 78/29/5621  Anemia due to acquired thiamine deficiency 12/10/2021   Deficiency anemia 12/04/2021   Oropharyngeal dysphagia 06/27/2021   Short gut syndrome    Syncope 04/22/2021   Major depressive disorder 04/22/2021   Sinus node dysfunction ([place]) 04/22/2021   Atrial fibrillation with rapid ventricular response ([place]) 04/22/2021   AV block, 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   Orthostatic hypotension  02/20/2021   Iron deficiency anemia due to chronic blood loss 02/20/2021   Chronic diarrhea 01/18/2021   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[place]) 01/18/2021   GIB (gastrointestinal bleeding) 01/18/2021   Essential hypertension 01/18/2021   Hypokalemia due to excessive gastrointestinal loss of potassium 01/18/2021   Chronic kidney disease, stage 3a ([place]) 01/18/2021   Conditions to be addressed/monitored:  Atrial Fibrillation and HTN  Care Plan : RN Care Manager Plan of Care  Updates made by [name], RN since 01/07/2022 12:00 AM     Problem: Chronic Disease Management Needs   Priority: High     Long-Range Goal: Ongoing adherence to established plan of care for long term chronic disease management   Start Date: 07/02/2021  Expected End Date: 07/02/2022  Priority: High  Note:   Current Barriers:  Chronic Disease Management support and education needs related to Atrial Fibrillation and HTN Hospitalization October 25-27, 2022 for AF with RVR-- 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 History of frequent recent episodes syncope/ resulting in falls-- thus far without injury-- patient reports 16- plus falls over last year, all without significant injury: patient has/ uses walker, cane, wheelchair Update 09/04/21: patient reports no new/ recent falls since mid January; confirms consistently using cane/ walker as indicated Update 11/14/21: recent hospitalization April 23-28, 2023 for ongoing syncopal episodes/ falls with injury; discharged from hospital to SNF/ rehabilitation center; discharged from [facility] 11/08/21 with home health services through [facility] 12/02/21- report home health services continue; endorses active participation in all disciplines 01/07/22- reports home health services continue for now, but will be ending soon: confirmed initial visit with [facility] scheduled for 01/12/22 01/07/22- reports they are currently exploring options around private duty care  services, as recommended by CCM LCSW Transitioning care from [facility] to [facility]: is well-established with current counseling provider-- but would like to have counseling services in [place]: will place CCM CSW referral for same Update 09/04/21: patient reports established with psychiatric provider in [place]; had first office visit 08/19/21 Update 12/02/21- reports has transitioned all of his health care providers to [facility]: this barrier is now resolved [facility]- inconsistently wears hearing aids: does not like hearing aids, ambient noise-- encouraged to follow up with hearing aid provider to have adjustments as indicated Patient and spouse report that patient has had recent memory issues  RNCM Clinical Goal(s):  Patient will demonstrate ongoing health management independence as evidenced by adherence to plan of care for A-Fib/ HTN with hypotensive syncopal episodes, presumably due to A-F        through collaboration with RN Care manager, provider, and care team.   Interventions: 1:1 collaboration with primary care provider regarding development and update of comprehensive plan of care as evidenced by provider attestation and co-signature Inter-disciplinary care team collaboration (see longitudinal plan of care) Evaluation of current treatment plan related to  self management and patient's adherence to plan as established by provider Initial assessment completed 07/02/21 Review of patient status, including review of consultants reports, relevant laboratory and other test results, and medications completed Pain assessment updated: continues to deny pain Falls assessment updated: continues to  report ongoing falls due to dizziness/ pre-syncope; reports last fall "about a week ago;" reports minor injury with fall, "got a skin tear;" confirms has scheduled appointment with wound clinic to address- home health RN currently managing; spouse/ patient report, "it is healing;" confirms  continues using cane/ walker/ wheelchair as needed, "all of the time;"  reinforced previously provided education around fall risks/ prevention  Medications discussed: reports spouse continues to manage medications; denies current concerns/ recent changes/ issues/ questions around medications; endorses adherence to taking all medications as prescribed-- confirm they have started taking vitamin B as recommended by PCP Reviewed recent oncology provider office visits: reports 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: confirm MRI has now been scheduled on 01/26/22 Reviewed recent PCP office visit 12/04/21- confirm that patient is now taking vitamin B as prescribed/ recommended Confirmed patient attended recent psychiatric provider office visit and remains actively established with provider Reviewed upcoming scheduled provider appointments: 01/09/22- neurology provider; 01/12/22- wound clinic/ palliative care provider; 01/26/22- oncology provider; 02/10/22- PCP; patient confirms is aware of all and has plans to attend as scheduled Discussed plans with patient for ongoing care management follow up- patient denies current care coordination/ care management needs and is agreeable to CCM RN CM case closure today; verbalizes understanding to contact PCP or other care providers for any needs that arise in the future, and confirms they have contact information for all care providers     A-fib:  (Status: 01/07/22: Goal Met.) Long Term Goal  Counseled on importance of regular laboratory monitoring as prescribed Afib action plan reviewed/ reinforced Confirmed no recent signs/ symptoms A-Fib; reinforced previously provided education around signs/ symptoms development A-Fib along corresponding action plan-  Confirms he has been using his maintenance and rescue inhalers as prescribed Confirmed patient continues to report trying to follow "heart healthy diet;" continues trying to manage periodic ongoing decreased  appetite  Orthostatic hypotension with syncope in setting of Hx Hypertension: (Status: 01/07/22: Goal Met.) Long Term Goal  Last practice recorded BP readings:  BP Readings from Last 3 Encounters:  12/29/21 (!) 172/119  12/04/21 140/88  12/01/21 127/78  Most recent eGFR/CrCl: No results found for: EGFR  No components found for: CRCL  Evaluation of current treatment plan related to hypertension self management and patient's adherence to plan as established by provider;   Confirmed patient continues taking midodrine TID Encouraged patient to continues frequent blood pressure monitoring at home, and writing on paper to share with cardiology provider at upcoming scheduled office visit- patient and caregiver report "all recent BP's are good-- they are a little on the high side, but that is where they want him to be;" due to ongoing episodes orthostatic hypotension     Plan: No further follow up required: spouse/caregiver and patient denies current care coordination/ care management needs and is agreeable to CCM RN CM case closure today given community palliative care team is now active; CCM RN CM case closure accordingly  [name], RN, BSN, [place] [phone]: direct office

## 2022-01-08 ENCOUNTER — Other Ambulatory Visit (HOSPITAL_COMMUNITY): Payer: Medicare Other

## 2022-01-09 ENCOUNTER — Inpatient Hospital Stay: Payer: Medicare Other

## 2022-01-09 ENCOUNTER — Ambulatory Visit: Payer: Medicare Other | Admitting: Neurology

## 2022-01-09 ENCOUNTER — Encounter: Payer: Self-pay | Admitting: Neurology

## 2022-01-09 ENCOUNTER — Telehealth: Payer: Self-pay

## 2022-01-09 VITALS — BP 158/90 | HR 75 | Ht 73.0 in | Wt 136.2 lb

## 2022-01-09 DIAGNOSIS — R404 Transient alteration of awareness: Secondary | ICD-10-CM

## 2022-01-09 DIAGNOSIS — I951 Orthostatic hypotension: Secondary | ICD-10-CM | POA: Diagnosis not present

## 2022-01-09 DIAGNOSIS — J45909 Unspecified asthma, uncomplicated: Secondary | ICD-10-CM | POA: Diagnosis not present

## 2022-01-09 DIAGNOSIS — R55 Syncope and collapse: Secondary | ICD-10-CM | POA: Diagnosis not present

## 2022-01-09 DIAGNOSIS — N1832 Chronic kidney disease, stage 3b: Secondary | ICD-10-CM | POA: Diagnosis not present

## 2022-01-09 DIAGNOSIS — R42 Dizziness and giddiness: Secondary | ICD-10-CM

## 2022-01-09 NOTE — Progress Notes (Signed)
NEUROLOGY CONSULTATION NOTE  Tremont Gavitt Ingrum MRN: [id number] DOB: 11/13/1940  Referring provider: Dr. Scarlette Calico Primary care provider: Dr. Scarlette Calico  Reason for consult:  syncope, dizziness  Dear Dr Ronnald Ramp:  Thank you for your kind referral of Nijee Heatwole Drinkard for consultation of the above symptoms. Although his history is well known to you, please allow me to reiterate it for the purpose of our medical record. The patient was accompanied to the clinic by his wife [name] who also provides collateral information. Records and images were personally reviewed where available.   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 He feels symptoms started 2-2.5 years ago when he first started having terrible excruciating pain behind his neck and shoulders. This quieted down, he denies any further pain. He reports this went from that to passing out, with the first syncopal episode occurring in January 2021. He then had another in April 2021. He was found to have significant orthostatic hypotension, currently on Midodrine '15mg'$  TID (9am-2pm-6pm) and Fludrocortisone 0.'1mg'$  daily. He states that initially he was not having dizziness, but now he calls them "episodes" where he feels dizzy like he is in a fog. He feels weak and disoriented for a couple of seconds, but sometimes there is no warning and he falls to the ground. They report 20+ falls in the past 2 years. He has injured his shoulder, had a cut over his eye, and bit his tongue a few times. He gets tingling in his fingers, some numbness in his hands and feet, his nose starts to run and he drools a lot. His wife reports he would stare off, he is awake but not responding,  she has to call to him 5-8 times. He starts babbling and talking. This may or may not progress to complete loss of consciousness for 3-5 minutes, one lasted 8 minutes. They occur 3-4 times a week, Renee notes most of they time they occur when he is coming in and out of the bathroom, and have happened quite a few times in the shower. Afterwards, he has a funny feeling in his stomach, like a pain that he cannot describe, no nausea/vomiting. They do not occur when he is supine. He has had some sitting down, he gets symptomatic when standing and sits down and would pass out. He denies any other gaps in time, no olfactory/gustatory/visual hallucinations, or myoclonic jerks. There is a lot of numbness and cold feeling in his hands and feet. He has diarrhea due to short gut syndrome, for the last couple of weeks he has had daily diarrhea. He has occasional hand tremors that do not affect activities. Renee reports he talks and fights in his sleep. He has a lot of anxiety and depression. He has rare headaches. He has occasional blurred vision. He sometimes notices difficulty swallowing and has seen a speech therapist. He is doing his best to keep hydrated, Renee reports he is really realizing he needs to drink 64 oz of water daily. He does not want to use his abdominal binder or compression stockings. He had a normal birth and early development.  There is no history of febrile convulsions, CNS infections such as meningitis/encephalitis, significant traumatic brain injury, neurosurgical procedures, or family history of seizures.  Chemistry      Component Value Date/Time   NA 138 12/29/2021 1507   K 3.8 12/29/2021 1507   CL 107 12/29/2021 1507   CO2 24 12/29/2021 1507   BUN 39 (H) 12/29/2021 1507   CREATININE 2.68 (H) 12/29/2021 1507      Component Value Date/Time   CALCIUM 9.4 12/29/2021 1507   ALKPHOS 49 12/29/2021 1507   AST 13 (L) 12/29/2021 1507   ALT 11 12/29/2021 1507   BILITOT 0.7 12/29/2021 1507        PAST MEDICAL HISTORY: Past Medical History:  Diagnosis Date   Asthma    Carcinoid tumor, small intestine, malignant (HCC)    Chronic diarrhea    CKD (chronic kidney disease) stage 3, GFR 30-59 ml/min (HCC)    HTN (hypertension)    Short gut syndrome     PAST SURGICAL HISTORY: Past Surgical History:  Procedure Laterality Date   COLON SURGERY     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   Procedure: PACEMAKER IMPLANT;  Surgeon: Constance Haw, MD;  Location: [facility];  Service: Cardiovascular;  Laterality: N/A;    MEDICATIONS: Current Outpatient Medications on File Prior to Visit  Medication Sig Dispense Refill   albuterol (VENTOLIN HFA) 108 (90 Base) MCG/ACT inhaler Inhale 2 puffs into the lungs every 6 (six) hours as needed for wheezing or shortness of breath. 18 g 5   aspirin 81 MG EC tablet Take 81 mg by mouth daily. Swallow whole.     cholestyramine (QUESTRAN) 4 g packet MIX AND DRINK 1 PACKET(4 GRAMS) BY MOUTH TWICE DAILY WITH A MEAL 180 each 0   diphenoxylate-atropine (LOMOTIL) 2.5-0.025 MG tablet Take 1 tablet by mouth 2 (two) times daily as needed for diarrhea or loose stools. 30 tablet 0   dronabinol (MARINOL) 2.5 MG capsule Take 1 capsule (2.5 mg total) by mouth 2 (two) times daily before a meal. 60 capsule 0   feeding supplement, ENSURE COMPLETE, (ENSURE COMPLETE) LIQD Take 237 mLs by mouth 2 (two) times daily between meals.     fludrocortisone (FLORINEF) 0.1 MG tablet Take 1 tablet (0.1 mg total) by mouth daily. 90 tablet 3   iron polysaccharides (NIFEREX) 150 MG capsule TAKE 1 CAPSULE(150 MG) BY MOUTH 3 TIMES A WEEK 30 capsule 2   LANREOTIDE ACETATE [place] Inject 1 Dose into the skin every 30 (thirty) days.     loperamide (IMODIUM A-D) 2 MG capsule Take by mouth as needed for diarrhea or loose stools.     midodrine (PROAMATINE) 5 MG tablet Take 3 tablets (15 mg total) by mouth 3 (three) times daily with meals. 270 tablet 5   mirtazapine (REMERON) 15 MG  tablet Take 1 tablet (15 mg total) by mouth daily. 90 tablet 3   Multiple Vitamins-Minerals (PRESERVISION AREDS 2 PO) Take 1 capsule by mouth daily.     multivitamin (ONE-A-DAY MEN'S) TABS tablet Take 1 tablet by mouth daily.     Potassium Chloride ER 20 MEQ TBCR Take 1 tablet by mouth 2 (two) times daily. 60 tablet 5   SYMBICORT 160-4.5 MCG/ACT inhaler Inhale 2 puffs into the lungs daily as needed (wheezing). 3 each 1   thiamine (VITAMIN B-1) 50 MG tablet Take 1 tablet (50 mg total) by mouth daily. 90 tablet 1   No current facility-administered medications on file prior to visit.    ALLERGIES: Allergies  Allergen Reactions   Penicillins Hives and Shortness Of Breath   Lisinopril Itching    FAMILY HISTORY:  Family History  Problem Relation Age of Onset   Alzheimer's disease Mother     SOCIAL HISTORY: Social History   Socioeconomic History   Marital status: Married    Spouse name: Not on file   Number of children: Not on file   Years of education: Not on file   Highest education level: Not on file  Occupational History   Not on file  Tobacco Use   Smoking status: Some Days    Packs/day: 0.30    Years: 61.00    Total pack years: 18.30    Types: Cigars, Cigarettes    Start date: 22    Last attempt to quit: 03/03/2020    Years since quitting: 1.8   Smokeless tobacco: Never   Tobacco comments:    Former Risk manager Use: Never used  Substance and Sexual Activity   Alcohol use: Not Currently   Drug use: No   Sexual activity: Not Currently  Other Topics Concern   Not on file  Social History Narrative   Right handed    Lives with wife    Social Determinants of Health   Financial Resource Strain: Low Risk  (07/02/2021)   Overall Financial Resource Strain (CARDIA)    Difficulty of Paying Living Expenses: Not very hard  Food Insecurity: No Food Insecurity (01/07/2022)   Hunger Vital Sign    Worried About Running Out of Food in the Last Year: Never true     [place] in the Last Year: Never true  Transportation Needs: No Transportation Needs (01/07/2022)   PRAPARE - Hydrologist (Medical): No    Lack of Transportation (Non-Medical): No  Physical Activity: Not on file  Stress: Stress Concern Present (07/15/2021)   [place]    Feeling of Stress : Very much  Social Connections: Not on file  Intimate Partner Violence: Not on file     PHYSICAL EXAM: Vitals:   01/09/22 1013  BP: (!) 158/90  Pulse: 75  SpO2: 98%   General: No acute distress Head:  Normocephalic/atraumatic Skin/Extremities: No rash, no edema Neurological Exam: Mental status: alert and awake, no dysarthria or aphasia, Fund of knowledge is appropriate. Attention and concentration are normal.     Cranial nerves: CN I: not tested CN II: pupils equal, round and reactive to light, visual fields intact CN III, IV, VI:  full range of motion, no nystagmus, no ptosis CN V: facial sensation intact CN VII: upper and lower face symmetric CN VIII: hearing intact to conversation Bulk & Tone: normal, no coghweeling, no fasciculations. Motor: 5/5 throughout with no pronator drift. Sensation: intact to light touch, cold, pin on both UE, decreased cold to right ankle, intact pin and vibration sense.  No extinction to double simultaneous stimulation.  Deep Tendon Reflexes: +1 throughout Plantar responses: downgoing bilaterally Cerebellar: no incoordination on finger to nose, heel to shin. 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 We discussed symptoms are likely related to underlying orthostatic hypotension. He has symptoms suggestive of REM behavior disorder but no other Parkinsonian signs. His wife reports staring episodes during his spells, we  discussed doing an EEG and brain MRI without contrast. Dizziness may also be seen with vertebrobasilar insufficiency, MRA head without contrast and carotid dopplers will be ordered to evaluate. Unable to do contrast studies due to kidney disease which is being evaluated as well. He was advised to start using the abdominal binder as his other doctors have recommended, continue with increased hydration, follow-up with Cardiology for orthostatic hypotension. He does not drive. Follow-up after tests, they know to call for any changes.     Thank you for allowing me to participate in the care of this patient. Please do not hesitate to call for any questions or concerns.   Ellouise Newer, M.D.  CC: Dr. Ronnald Ramp

## 2022-01-09 NOTE — Patient Instructions (Signed)
Good to meet you.  Schedule MRI brain without contrast, MRA head without contrast  2. Schedule carotid dopplers  3. Schedule EEG  4.It is strongly recommended to use the abdominal binder, continue with increasing hydration  5. Follow-up after tests, call for any changes

## 2022-01-09 NOTE — Telephone Encounter (Signed)
Called [employer] advantage no PA was needed for MRI of the brain, MRA of the Brain or bilateral carotid US reference number [id number] MRI/MRA is scheduled for 7/31 carotid US scheduled for 7/20 pt wife called with dates and times.

## 2022-01-12 ENCOUNTER — Encounter (HOSPITAL_BASED_OUTPATIENT_CLINIC_OR_DEPARTMENT_OTHER): Payer: Medicare Other | Admitting: Internal Medicine

## 2022-01-12 ENCOUNTER — Telehealth: Payer: Medicare Other | Admitting: Internal Medicine

## 2022-01-12 DIAGNOSIS — F329 Major depressive disorder, single episode, unspecified: Secondary | ICD-10-CM

## 2022-01-12 DIAGNOSIS — Z515 Encounter for palliative care: Secondary | ICD-10-CM

## 2022-01-12 DIAGNOSIS — C7A019 Malignant carcinoid tumor of the small intestine, unspecified portion: Secondary | ICD-10-CM

## 2022-01-12 DIAGNOSIS — I951 Orthostatic hypotension: Secondary | ICD-10-CM

## 2022-01-12 MED ORDER — MIRTAZAPINE 30 MG PO TABS: 30.0000 mg | ORAL_TABLET | Freq: Every day | ORAL | 3 refills | Status: DC

## 2022-01-12 NOTE — Progress Notes (Signed)
[facility] Consult Note Telephone: [phone]  Fax: [phone]   Date of encounter: 01/12/22 9:32 AM PATIENT NAME: Alan [name] [place] [id number]   [phone] (home)  DOB: 1941-06-02 MRN: [id number] PRIMARY CARE PROVIDER:    [name], MD,  [place] [address] [phone]  REFERRING PROVIDER:   [name], MD [address] [place],  [place] [address] [phone]  RESPONSIBLE PARTY:    Contact Information     Name Relation Home Work Mobile   Snooks,[name] Spouse [phone]  [phone]   [place] Pandora Leiter [phone]  [phone]        Due to the COVID-19 crisis, this visit was done via telemedicine from my office and it was initiated and consent by this patient and or family.  I connected with  Weber Monnier Mccrone OR PROXY on 01/12/22 by a video enabled telemedicine application and verified that I am speaking with the correct person using two identifiers.   I discussed the limitations of evaluation and management by telemedicine. The patient expressed understanding and agreed to proceed.  Palliative Care was asked to follow this patient by consultation request of  [name], MD to address advance care planning and complex medical decision making. This is the initial visit.                                     ASSESSMENT AND PLAN / RECOMMENDATIONS:   Advance Care Planning/Goals of Care: Goals include to maximize quality of life and symptom management. Patient/health care surrogate gave his/her permission to discuss.Our advance care planning conversation included a discussion about:    The value and importance of advance care planning  Experiences with loved ones who have been seriously ill or have died  Exploration of personal, cultural or spiritual beliefs that might influence medical decisions  Exploration of goals of care in the event of a  sudden injury or illness  Identification  of a healthcare agent--HCPOA is wife, living will He feels bad putting his wife through all of this--he feels like he's a burden to her.  He says it's affecting their marriage--he's 20 yrs older and they've been married 80 yrs.  He's at the point where he's ready to give up anyway.  He stays depressed a lot.  He admits to being depressed in the past.  He took lithium 30 yrs ago plus.  He's also had anxiety.  He worries a lot about death and dying.  He has a new grandson and two great grands that he loves to death.  He'd like to stay here for a while b/c of that.  He says he has to spend his day worrying about whether he will fall if he stands.   Review and updating or creation of an  advance directive document . Decision not to resuscitate or to de-escalate disease focused treatments due to poor prognosis. CODE STATUS:  FULL CODE.    Symptom Management/Plan: 1. Malignant carcinoid tumor of small intestine, unspecified location ([facility]) -orthostatic symptoms/odd hormonal type presentation to this that sounds like it could be related -has had a new kidney mass that may or may not be metastatic disease -he is declining -we discussed goals, ACP -he is not yet ready to die and has a lot of anxiety and fears about it -requests even to receive CPR  at this point, but also complains of poor quality of life and spending too much time/his whole life attending appts and worries his wife will leave him -would be hospice eligible, but he's not there yet  2. Major depressive disorder, remission status unspecified, unspecified whether recurrent Increase mirtazapine to '30mg'$  nightly to address depression moreso at the higher doses which seems to be his dominant symptom at this time  - mirtazapine (REMERON) 30 MG tablet; Take 1 tablet (30 mg total) by mouth daily.  Dispense: 90 tablet; Refill: 3  3. Orthostatic hypotension -seems related to his carcinoid, but also pt has  anemia, ckd, afib, short gut syndrome all of which could contribute to syncopal spells, as well or worsen his condition -this is certainly contributing to his decline as he has a fear of recurrence that is limiting his mobility  4. Palliative care by specialist -continue to follow, I'll see him at home -currently requests full code and wants to spend more time with family, not yet ready to accept his prognosis or hospice  Follow up Palliative Care Visit: Palliative care will continue to follow for complex medical decision making, advance care planning, and clarification of goals. Return 2022/02/26  This visit was coded based on medical decision making (MDM). 20 mins spent on ACP.  PPS: 40%  HOSPICE ELIGIBILITY/DIAGNOSIS: yes if off meds for carcinoid and ready/malignant carcinoid of small bowel with new renal mass  Chief Complaint: Initial palliative consult  HISTORY OF PRESENT ILLNESS:  Alan [name] is a 81 y.o. 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   It's thought that he has orthostatic hypotension.   2011 is when he had the carcinoid tumor surgery.  A new drug has been recommended, but they don't feel comfortable with his kidney function for that.  He has a kidney ultrasound coming up 7/31.  Mass is on his kidney.  Function was "28%".   He's seen several doctors since June.  It's not clear if the carcinoid in his bowel and kidney tumor are related.  He has had a lot of depression.  He had a fall where he hurt his shoulder.  Has a fear he'll pass out somewhere.  He does bathe and dress on his own, but his wife is with him 99% of  time--she is near him when he gets in the shower.  He sits to take his shower.  Going to bed earlier than he used to.  Sleeps well first part of the night.  Up urinating 5-6 times after that in order to hydrate enough.  He's lost over 100 lbs b/c he does not have an appetite and he's not absorbing the nutrients from the food.  He was 280 lbs 2 yrs ago and now 137 lbs.  It's beginning to take a toll on his energy.  Tries to nap in day sometimes.  He has a lot of appts--nearly daily.  Every day is something.  Feels like he is still working.  He has fecal incontinence b/c of his cancer--has diarrhea.  Dr. Irene Limbo gives him imodium for this.  He's also on lenreotide to help with that also--monthly shot.  Usually makes it in time to  urinate.  He does not have pain.  Gets some discomfort and gas--uses gas-x--lomotil and imodium help.  It's worrisome and mind boggling at times.  Admits his mind gets foggy, he's disoriented, he babbles, sometimes he passes out, nose runs, stomach hurts.    He feels like his quality of life is not what it used to be.    He uses a cane and a walker depending on the location.  Walker except when going through small spaces.  He has the physical equipment and medications he needs.    He's on mirtazapine for depression and appetite.  His wife says he rests a little bit better.  Depression getting worse.    He took marinol, but not refilled as it did not help and expensive.  His wife notes there was a slight improvement in intake.  He's not wearing the abdominal binder due to discomfort--cannot sleep in it but isn't supposed to.  It's a production when he has to go to the ba  throom.  He had a panic attack trying to loosen it and his pants to use the bathroom.  Stockings too hard to get on.  His wife says he focuses on the negative not positive.  His wife says it's his attitude that will do him in.    He was in rehab at one point, and saw many worse off than he.  2.5 yrs ago, he had a  terrible pain in the back of his neck and shoulders while golfing.  Had no problem until he started passing out.  This is what he still gets today.He tries to get near something and sit down.  He's on the midodrine and florinef.  They have decreased episodes.  Mrs is frustrated about his attitude.  She does not plan to leave him, but he talks about this.  He can't stand the unpredictability.    He was doing ok dealing with the cancer, but the orthostatic hypotension has affected him negatively.    He sees Dr. Michail Sermon now for his depression.    He just got discharged from [facility], PT from [facility].  He'd plateaued.  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    He's lost his energy.  He's more sob especially around the time of an episode.  No congestion.  Last episode was two weeks ago.  He's more anxious if his wife is not there.  She went to [place] for a week and his anxiety was terrible.  She needs to take care of herself.  She's trying to do a me day each week.  She never leaves him alone.  She says he does not trust her.    History obtained from review of EMR, discussion with primary team, and interview with family, facility staff/caregiver and/or Mr. Bruntz.   I reviewed available labs, medications, imaging, studies and related documents from the EMR.  Records reviewed and summarized above.   ROS Review of Systems  see hpi  Physical Exam: There were no vitals filed for this visit. There is no height or weight on file to calculate BMI. Wt Readings from Last 500 Encounters:  01/09/22 136 lb 3.2 oz (61.8 kg)  12/04/21 136 lb (61.7 kg)  11/25/21 141 lb 1.6 oz (64 kg)  11/10/21 144 lb (65.3 kg)  11/04/21 142 lb (64.4 kg)  10/24/21 124 lb 1.9 oz (56.3 kg)  10/13/21 136 lb 8 oz (61.9 kg)  09/01/21 139 lb (63 kg)  08/15/21 138 lb 12.8 oz (  63 kg)  07/28/21 139 lb 3.2 oz (63.1 kg)  06/26/21 141 lb (64 kg)  06/11/21 139 lb 3.2 oz (63.1 kg)  06/09/21 142 lb (64.4 kg)  04/23/21 141 lb 4.8 oz (64.1 kg)   04/21/21 143 lb 12.8 oz (65.2 kg)  02/25/21 148 lb 6.4 oz (67.3 kg)  02/20/21 147 lb 9.6 oz (67 kg)  02/19/21 145 lb 9.6 oz (66 kg)  01/22/21 156 lb 12 oz (71.1 kg)  01/17/21 149 lb 3.2 oz (67.7 kg)  01/21/11 173 lb (78.5 kg)   Physical Exam Constitutional:      Appearance: He is ill-appearing.     Comments: Frail, cachectic-appearing  Pulmonary:     Effort: Pulmonary effort is normal.  Abdominal:     Tenderness: There is no abdominal tenderness.  Musculoskeletal:        General: Normal range of motion.  Neurological:     General: No focal deficit present.     Mental Status: He is oriented to person, place, and time.     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   Orthostatic hypotension 02/20/2021   Iron deficiency anemia due to chronic blood loss 02/20/2021   Chronic diarrhea 01/18/2021   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[facility] [facility]) 01/18/2021   GIB (gastrointestinal bleeding) 01/18/2021   Essential hypertension 01/18/2021   Hypokalemia due to excessive gastrointestinal loss of potassium 01/18/2021   Chronic kidney disease, stage 3a ([facility]) 01/18/2021   PAST MEDICAL HISTORY:  Active Ambulatory Problems    Diagnosis Date Noted   Chronic diarrhea 01/18/2021   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[facility]) 01/18/2021   GIB (gastrointestinal bleeding) 01/18/2021   Essential hypertension 01/18/2021   Hypokalemia due to excessive gastrointestinal loss of potassium 01/18/2021   Chronic kidney disease, stage 3a ([facility])  01/18/2021   Orthostatic hypotension 02/20/2021   Iron deficiency anemia due to chronic blood loss 02/20/2021   AV block, 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 [place]) 04/22/2021   Short gut syndrome    Oropharyngeal dysphagia 06/27/2021   Deficiency anemia 12/04/2021   Anemia due to acquired thiamine deficiency 12/10/2021   Skin tear of left forearm without complication 12/27/1599   Resolved Ambulatory Problems    Diagnosis Date Noted   Acute pulmonary embolism ([place]) 01/16/2021   Near syncope 01/18/2021   Bradycardia 01/18/2021   Deficiency anemia 02/19/2021   Weight loss, abnormal 02/19/2021   Productive cough 02/19/2021   Acute bronchitis due to 2019 novel coronavirus 02/20/2021   Elevated troponin level not due myocardial infarction 04/22/2021   Acute renal failure superimposed on stage 3b chronic kidney disease ([facility]) 10/19/2021   Past Medical History:  Diagnosis Date   Asthma    Carcinoid tumor, small intestine, malignant (HCC)    CKD (chronic kidney disease) stage 3, GFR 30-59 ml/min (HCC)    HTN (hypertension)    SOCIAL HX:  Social History   Tobacco Use  Smoking status: Some Days    Packs/day: 0.30    Years: 61.00    Total pack years: 18.30    Types: Cigars, Cigarettes    Start date: 63    Last attempt to quit: 03/03/2020    Years since quitting: 1.8   Smokeless tobacco: Never   Tobacco comments:    Former cig   Substance Use Topics   Alcohol use: Not Currently   FAMILY HX:  Family History  Problem Relation Age of Onset   Alzheimer's disease Mother       ALLERGIES:  Allergies  Allergen Reactions   Penicillins Hives and Shortness Of Breath   Lisinopril Itching      PERTINENT MEDICATIONS:  Outpatient Encounter Medications as of 01/12/2022  Medication Sig   albuterol (VENTOLIN HFA) 108 (90 Base) MCG/ACT inhaler  Inhale 2 puffs into the lungs every 6 (six) hours as needed for wheezing or shortness of breath.   aspirin 81 MG EC tablet Take 81 mg by mouth daily. Swallow whole.   cholestyramine (QUESTRAN) 4 g packet MIX AND DRINK 1 PACKET(4 GRAMS) BY MOUTH TWICE DAILY WITH A MEAL   diphenoxylate-atropine (LOMOTIL) 2.5-0.025 MG tablet Take 1 tablet by mouth 2 (two) times daily as needed for diarrhea or loose stools.   dronabinol (MARINOL) 2.5 MG capsule Take 1 capsule (2.5 mg total) by mouth 2 (two) times daily before a meal.   feeding supplement, ENSURE COMPLETE, (ENSURE COMPLETE) LIQD Take 237 mLs by mouth 2 (two) times daily between meals.   fludrocortisone (FLORINEF) 0.1 MG tablet Take 1 tablet (0.1 mg total) by mouth daily.   iron polysaccharides (NIFEREX) 150 MG capsule TAKE 1 CAPSULE(150 MG) BY MOUTH 3 TIMES A WEEK   LANREOTIDE ACETATE  Inject 1 Dose into the skin every 30 (thirty) days.   loperamide (IMODIUM A-D) 2 MG capsule Take by mouth as needed for diarrhea or loose stools.   midodrine (PROAMATINE) 5 MG tablet Take 3 tablets (15 mg total) by mouth 3 (three) times daily with meals.   mirtazapine (REMERON) 15 MG tablet Take 1 tablet (15 mg total) by mouth daily.   Multiple Vitamins-Minerals (PRESERVISION AREDS 2 PO) Take 1 capsule by mouth daily.   multivitamin (ONE-A-DAY MEN'S) TABS tablet Take 1 tablet by mouth daily.   Potassium Chloride ER 20 MEQ TBCR Take 1 tablet by mouth 2 (two) times daily.   SYMBICORT 160-4.5 MCG/ACT inhaler Inhale 2 puffs into the lungs daily as needed (wheezing).   thiamine (VITAMIN B-1) 50 MG tablet Take 1 tablet (50 mg total) by mouth daily.   No facility-administered encounter medications on file as of 01/12/2022.    Thank you for the opportunity to participate in the care of Mr. Washburn.  The palliative care team will continue to follow. Please call our office at [phone] if we can be of additional assistance.   Hollace Kinnier, DO  COVID-19 PATIENT SCREENING  TOOL Asked and negative response unless otherwise noted:  Have you had symptoms of covid, tested positive or been in contact with someone with symptoms/positive test in the past 5-10 days?  NO

## 2022-01-13 ENCOUNTER — Ambulatory Visit (INDEPENDENT_AMBULATORY_CARE_PROVIDER_SITE_OTHER): Payer: Medicare Other | Admitting: Psychologist

## 2022-01-13 DIAGNOSIS — F411 Generalized anxiety disorder: Secondary | ICD-10-CM

## 2022-01-13 DIAGNOSIS — F331 Major depressive disorder, recurrent, moderate: Secondary | ICD-10-CM

## 2022-01-13 NOTE — Progress Notes (Signed)
[facility] Counselor/Therapist Progress Note  Patient ID: Alan [name], MRN: [id number],    Date: 01/13/2022  Time Spent: 1:06 pm to 1:30 pm; total time: 24 minutes   This session was held via in person. The patient consented to in-person therapy and was in the clinician's office. Limits of confidentiality were discussed with the patient.   Treatment Type: Individual Therapy  Reported Symptoms: Anxiety related to death and dying  Mental Status Exam: Appearance:  Well Groomed     Behavior: Appropriate  Motor: Normal  Speech/Language:  Clear and Coherent  Affect: Appropriate  Mood: normal  Thought process: normal  Thought content:   WNL  Sensory/Perceptual disturbances:   WNL  Orientation: oriented to person, place, and time/date  Attention: Good  Concentration: Good  Memory: WNL  Fund of knowledge:  Good  Insight:   Fair  Judgment:  Fair  Impulse Control: Good   Risk Assessment: Danger to Self:  No Self-injurious Behavior: No Danger to Others: No Duty to Warn:no Physical Aggression / Violence:No  Access to Firearms a concern: No  Gang Involvement:No   Subjective: Beginning the session, patient described himself as not doing well describing himself as being in a "fog". He briefly reflected on fears related to death and dying. He then ended the session stating that he was not feeling well. He asked to follow up. He denied suicidal and homicidal ideation.    Interventions:  Worked on developing a therapeutic relationship with the patient using active listening and reflective statement. Provided emotional support using empathy and validation. Reflected on events since the last session. Processed thoughts and emotions. Identified goals for the session. Reflected on some of the concerns patient was experiencing related to death and dying. Briefly explored the etiology of fears of death and dying. Discussed next steps for the patient.  Provided empathic  statements. Assessed for suicidal and homicidal ideation.   Homework: NA  Next Session: Emotional support. Process fears of dying and dignity of life  Diagnosis: F41.1 generalized anxiety disorder and F33.1 major depressive affective disorder, recurrent, moderate   Plan:   Client Abilities: Friendly and easy to develop rapport  Client Preferences: ACT and CBT  Client statement of Needs: Coping skills and emotional support  Treatment Level:Outpatient   Goals Alleviate depressive symptoms Recognize, accept, and cope with depressive feelings Develop healthy thinking patterns Develop healthy interpersonal relationships Reduce overall frequency, intensity, and duration of anxiety Stabilize anxiety level wile increasing ability to function Enhance ability to effectively cope with full variety of stressors Learn and implement coping skills that result in a reduction of anxiety   Objectives target date for all objectives is 08/19/2022 Verbalize an understanding of the cognitive, physiological, and behavioral components of anxiety Learning and implement calming skills to reduce overall anxiety Verbalize an understanding of the role that cognitive biases play in excessive irrational worry and persistent anxiety symptoms Identify, challenge, and replace based fearful talk Learn and implement problem solving strategies Identify and engage in pleasant activities Learning and implement personal and interpersonal skills to reduce anxiety and improve interpersonal relationships Learn to accept limitations in life and commit to tolerating, rather than avoiding, unpleasant emotions while accomplishing meaningful goals Identify major life conflicts from the past and present that form the basis for present anxiety Maintain involvement in work, family, and social activities Reestablish a consistent sleep-wake cycle Cooperate with a medical evaluation  Cooperate with a medication evaluation by a  physician Verbalize an accurate understanding of depression Verbalize  an understanding of the treatment Identify and replace thoughts that support depression Learn and implement behavioral strategies Verbalize an understanding and resolution of current interpersonal problems Learn and implement problem solving and decision making skills Learn and implement conflict resolution skills to resolve interpersonal problems Verbalize an understanding of healthy and unhealthy emotions verbalize insight into how past relationships may be influence current experiences with depression Use mindfulness and acceptance strategies and increase value based behavior  Increase hopeful statements about the future.  Interventions Engage the patient in behavioral activation Use instruction, modeling, and role-playing to build the client's general social, communication, and/or conflict resolution skills Use Acceptance and Commitment Therapy to help client accept uncomfortable realities in order to accomplish value-consistent goals Reinforce the client's insight into the role of his/her past emotional pain and present anxiety  Support the client in following through with work, family, and social activities Teach and implement sleep hygiene practices  Refer the patient to a physician for a psychotropic medication consultation Monito the clint's psychotropic medication compliance Discuss how anxiety typically involves excessive worry, various bodily expressions of tension, and avoidance of what is threatening that interact to maintain the problem  Teach the patient relaxation skills Assign the patient homework Discuss examples demonstrating that unrealistic worry overestimates the probability of threats and underestimates patient's ability  Assist the patient in analyzing his or her worries Help patient understand that avoidance is reinforcing  Consistent with treatment model, discuss how change in cognitive,  behavioral, and interpersonal can help client alleviate depression CBT Behavioral activation help the client explore the relationship, nature of the dispute,  Help the client develop new interpersonal skills and relationships Conduct Problem solving therapy Teach conflict resolution skills Use a process-experiential approach Conduct TLDP Conduct ACT Evaluate need for psychotropic medication Monitor adherence to medication   The patient and clinician reviewed the treatment plan on 09/09/2021. The patient approved of the treatment plan.    Conception Chancy, PsyD               Conception Chancy, PsyD

## 2022-01-15 ENCOUNTER — Other Ambulatory Visit (HOSPITAL_COMMUNITY): Payer: Medicare Other

## 2022-01-16 ENCOUNTER — Encounter: Payer: Self-pay | Admitting: Internal Medicine

## 2022-01-16 DIAGNOSIS — S42031D Displaced fracture of lateral end of right clavicle, subsequent encounter for fracture with routine healing: Secondary | ICD-10-CM | POA: Diagnosis not present

## 2022-01-20 ENCOUNTER — Ambulatory Visit (HOSPITAL_COMMUNITY)
Admission: RE | Admit: 2022-01-20 | Discharge: 2022-01-20 | Disposition: A | Discharge status: Home / Self Care | Payer: Medicare Other | Source: Ambulatory Visit | Attending: Neurology | Admitting: Neurology

## 2022-01-20 DIAGNOSIS — R404 Transient alteration of awareness: Secondary | ICD-10-CM | POA: Insufficient documentation

## 2022-01-20 DIAGNOSIS — R42 Dizziness and giddiness: Secondary | ICD-10-CM | POA: Diagnosis not present

## 2022-01-20 DIAGNOSIS — R55 Syncope and collapse: Secondary | ICD-10-CM | POA: Diagnosis not present

## 2022-01-20 NOTE — Progress Notes (Signed)
Carotid artery duplex completed. Refer to "CV Proc" under chart review to view preliminary results.  01/20/2022 10:13 AM Kelby Aline., MHA, RVT, RDCS, RDMS

## 2022-01-21 ENCOUNTER — Telehealth: Payer: Self-pay

## 2022-01-21 NOTE — Telephone Encounter (Signed)
Pt and pt wife was on the phone both informed that the carotid ultrasound looked fine, no significant narrowing of blood vessels seen

## 2022-01-21 NOTE — Telephone Encounter (Signed)
-----   Message from Cameron Sprang, MD sent at 01/21/2022 11:16 AM EDT ----- Pls let him know the carotid ultrasound looked fine, no significant narrowing of blood vessels seen. thanks

## 2022-01-22 ENCOUNTER — Ambulatory Visit (INDEPENDENT_AMBULATORY_CARE_PROVIDER_SITE_OTHER): Payer: Medicare Other

## 2022-01-22 DIAGNOSIS — I495 Sick sinus syndrome: Secondary | ICD-10-CM | POA: Diagnosis not present

## 2022-01-22 LAB — CUP PACEART REMOTE DEVICE CHECK
Battery Remaining Percentage: 95 %
Brady Statistic RA Percent Paced: 88 %
Brady Statistic RV Percent Paced: 5 %
Date Time Interrogation Session: 20230726134231
Implantable Lead Implant Date: 20221026
Implantable Lead Implant Date: 20221026
Implantable Lead Location: 753859
Implantable Lead Location: 753860
Implantable Lead Model: 377169
Implantable Lead Model: 377169
Implantable Lead Serial Number: 8000563684
Implantable Lead Serial Number: 8000606646
Implantable Pulse Generator Implant Date: 20221026
Lead Channel Impedance Value: 410 Ohm
Lead Channel Impedance Value: 507 Ohm
Lead Channel Pacing Threshold Amplitude: 1.4 V
Lead Channel Pacing Threshold Pulse Width: 0.4 ms
Lead Channel Sensing Intrinsic Amplitude: 1.6 mV
Lead Channel Sensing Intrinsic Amplitude: 2 mV
Lead Channel Setting Pacing Amplitude: 2 V
Lead Channel Setting Pacing Amplitude: 2.4 V
Lead Channel Setting Pacing Pulse Width: 0.4 ms
Pulse Gen Model: 407145
Pulse Gen Serial Number: 70247401

## 2022-01-26 ENCOUNTER — Encounter: Payer: Self-pay | Admitting: Internal Medicine

## 2022-01-26 ENCOUNTER — Ambulatory Visit (HOSPITAL_COMMUNITY)
Admission: RE | Admit: 2022-01-26 | Discharge: 2022-01-26 | Disposition: A | Discharge status: Home / Self Care | Payer: Medicare Other | Source: Ambulatory Visit | Attending: Neurology | Admitting: Neurology

## 2022-01-26 ENCOUNTER — Inpatient Hospital Stay: Payer: Medicare Other

## 2022-01-26 ENCOUNTER — Encounter (HOSPITAL_COMMUNITY): Payer: Self-pay

## 2022-01-26 ENCOUNTER — Ambulatory Visit (HOSPITAL_COMMUNITY): Admission: RE | Admit: 2022-01-26 | Payer: Medicare Other | Source: Ambulatory Visit

## 2022-01-26 ENCOUNTER — Ambulatory Visit (HOSPITAL_COMMUNITY)
Admission: RE | Admit: 2022-01-26 | Discharge: 2022-01-26 | Disposition: A | Discharge status: Home / Self Care | Payer: Medicare Other | Source: Ambulatory Visit | Attending: Nephrology | Admitting: Nephrology

## 2022-01-26 ENCOUNTER — Other Ambulatory Visit: Payer: Self-pay

## 2022-01-26 DIAGNOSIS — N2889 Other specified disorders of kidney and ureter: Secondary | ICD-10-CM | POA: Diagnosis not present

## 2022-01-26 DIAGNOSIS — C7A019 Malignant carcinoid tumor of the small intestine, unspecified portion: Secondary | ICD-10-CM

## 2022-01-26 DIAGNOSIS — I4891 Unspecified atrial fibrillation: Secondary | ICD-10-CM

## 2022-01-26 DIAGNOSIS — N281 Cyst of kidney, acquired: Secondary | ICD-10-CM | POA: Diagnosis not present

## 2022-01-26 DIAGNOSIS — N200 Calculus of kidney: Secondary | ICD-10-CM | POA: Diagnosis not present

## 2022-01-26 DIAGNOSIS — R404 Transient alteration of awareness: Secondary | ICD-10-CM

## 2022-01-26 DIAGNOSIS — R42 Dizziness and giddiness: Secondary | ICD-10-CM | POA: Insufficient documentation

## 2022-01-26 DIAGNOSIS — R55 Syncope and collapse: Secondary | ICD-10-CM

## 2022-01-26 DIAGNOSIS — G319 Degenerative disease of nervous system, unspecified: Secondary | ICD-10-CM | POA: Diagnosis not present

## 2022-01-26 DIAGNOSIS — I6782 Cerebral ischemia: Secondary | ICD-10-CM | POA: Insufficient documentation

## 2022-01-26 DIAGNOSIS — N1832 Chronic kidney disease, stage 3b: Secondary | ICD-10-CM | POA: Diagnosis not present

## 2022-01-26 DIAGNOSIS — K7689 Other specified diseases of liver: Secondary | ICD-10-CM | POA: Diagnosis not present

## 2022-01-26 DIAGNOSIS — R188 Other ascites: Secondary | ICD-10-CM | POA: Diagnosis not present

## 2022-01-27 ENCOUNTER — Inpatient Hospital Stay: Payer: Medicare Other

## 2022-01-27 ENCOUNTER — Ambulatory Visit: Payer: Medicare Other | Admitting: Internal Medicine

## 2022-01-27 ENCOUNTER — Other Ambulatory Visit: Payer: Self-pay

## 2022-01-27 ENCOUNTER — Inpatient Hospital Stay: Payer: Medicare Other | Attending: Hematology

## 2022-01-27 ENCOUNTER — Telehealth: Payer: Self-pay

## 2022-01-27 VITALS — BP 146/93 | HR 94 | Temp 97.7°F | Resp 18

## 2022-01-27 DIAGNOSIS — N1832 Chronic kidney disease, stage 3b: Secondary | ICD-10-CM | POA: Insufficient documentation

## 2022-01-27 DIAGNOSIS — D509 Iron deficiency anemia, unspecified: Secondary | ICD-10-CM | POA: Insufficient documentation

## 2022-01-27 DIAGNOSIS — C7A019 Malignant carcinoid tumor of the small intestine, unspecified portion: Secondary | ICD-10-CM | POA: Diagnosis not present

## 2022-01-27 LAB — CBC WITH DIFFERENTIAL (CANCER CENTER ONLY)
Abs Immature Granulocytes: 0.01 10*3/uL (ref 0.00–0.07)
Basophils Absolute: 0 10*3/uL (ref 0.0–0.1)
Basophils Relative: 0 %
Eosinophils Absolute: 0 10*3/uL (ref 0.0–0.5)
Eosinophils Relative: 1 %
HCT: 28.6 % — ABNORMAL LOW (ref 39.0–52.0)
Hemoglobin: 10 g/dL — ABNORMAL LOW (ref 13.0–17.0)
Immature Granulocytes: 0 %
Lymphocytes Relative: 23 %
Lymphs Abs: 1.6 10*3/uL (ref 0.7–4.0)
MCH: 30.6 pg (ref 26.0–34.0)
MCHC: 35 g/dL (ref 30.0–36.0)
MCV: 87.5 fL (ref 80.0–100.0)
Monocytes Absolute: 0.6 10*3/uL (ref 0.1–1.0)
Monocytes Relative: 9 %
Neutro Abs: 4.7 10*3/uL (ref 1.7–7.7)
Neutrophils Relative %: 67 %
Platelet Count: 196 10*3/uL (ref 150–400)
RBC: 3.27 MIL/uL — ABNORMAL LOW (ref 4.22–5.81)
RDW: 13.6 % (ref 11.5–15.5)
WBC Count: 7 10*3/uL (ref 4.0–10.5)
nRBC: 0 % (ref 0.0–0.2)

## 2022-01-27 LAB — IRON AND IRON BINDING CAPACITY (CC-WL,HP ONLY)
Iron: 86 ug/dL (ref 45–182)
Saturation Ratios: 39 % (ref 17.9–39.5)
TIBC: 221 ug/dL — ABNORMAL LOW (ref 250–450)
UIBC: 135 ug/dL (ref 117–376)

## 2022-01-27 LAB — CMP (CANCER CENTER ONLY)
ALT: 13 U/L (ref 0–44)
AST: 14 U/L — ABNORMAL LOW (ref 15–41)
Albumin: 4.2 g/dL (ref 3.5–5.0)
Alkaline Phosphatase: 48 U/L (ref 38–126)
Anion gap: 8 (ref 5–15)
BUN: 49 mg/dL — ABNORMAL HIGH (ref 8–23)
CO2: 25 mmol/L (ref 22–32)
Calcium: 9.3 mg/dL (ref 8.9–10.3)
Chloride: 108 mmol/L (ref 98–111)
Creatinine: 3.68 mg/dL (ref 0.61–1.24)
GFR, Estimated: 16 mL/min — ABNORMAL LOW (ref >60)
Glucose, Bld: 112 mg/dL — ABNORMAL HIGH (ref 70–99)
Potassium: 4.4 mmol/L (ref 3.5–5.1)
Sodium: 141 mmol/L (ref 135–145)
Total Bilirubin: 0.6 mg/dL (ref 0.3–1.2)
Total Protein: 7.3 g/dL (ref 6.5–8.1)

## 2022-01-27 MED ORDER — LANREOTIDE ACETATE 120 MG/0.5ML ~~LOC~~ SOLN
120.0000 mg | Freq: Once | SUBCUTANEOUS | Status: AC
  Administered 2022-01-27: 120 mg via SUBCUTANEOUS
  Filled 2022-01-27: qty 120

## 2022-01-27 NOTE — Telephone Encounter (Signed)
CRITICAL VALUE STICKER  CRITICAL VALUE: Creatine = 3.68  RECEIVER (on-site recipient of call): Yetta Glassman, CMA  DATE & TIME NOTIFIED: 01/27/22 at 3:53pm  MESSENGER (representative from lab): Nira Conn  MD NOTIFIED: Irene Limbo  TIME OF NOTIFICATION: 01/27/22 at 3:55pm  RESPONSE: Notification provided to Dr. Irene Limbo and Ilda Foil., RN for follow-up with pt.

## 2022-01-27 NOTE — Telephone Encounter (Signed)
Contacted pt to report Cr 3.68 and per Dr Irene Limbo pt needs to go to the ED. Pt and wife acknowledged and verbalized understanding.

## 2022-01-28 ENCOUNTER — Ambulatory Visit (INDEPENDENT_AMBULATORY_CARE_PROVIDER_SITE_OTHER): Payer: Medicare Other | Admitting: Internal Medicine

## 2022-01-28 ENCOUNTER — Encounter: Payer: Self-pay | Admitting: Internal Medicine

## 2022-01-28 ENCOUNTER — Ambulatory Visit (HOSPITAL_BASED_OUTPATIENT_CLINIC_OR_DEPARTMENT_OTHER)
Admission: RE | Admit: 2022-01-28 | Discharge: 2022-01-28 | Disposition: A | Discharge status: Home / Self Care | Payer: Medicare Other | Source: Ambulatory Visit | Attending: Neurology | Admitting: Neurology

## 2022-01-28 VITALS — BP 146/78 | HR 67 | Temp 97.6°F | Ht 73.0 in | Wt 133.4 lb

## 2022-01-28 DIAGNOSIS — I10 Essential (primary) hypertension: Secondary | ICD-10-CM

## 2022-01-28 DIAGNOSIS — R42 Dizziness and giddiness: Secondary | ICD-10-CM

## 2022-01-28 DIAGNOSIS — J45909 Unspecified asthma, uncomplicated: Secondary | ICD-10-CM | POA: Diagnosis not present

## 2022-01-28 DIAGNOSIS — N185 Chronic kidney disease, stage 5: Secondary | ICD-10-CM

## 2022-01-28 DIAGNOSIS — R778 Other specified abnormalities of plasma proteins: Secondary | ICD-10-CM | POA: Diagnosis not present

## 2022-01-28 DIAGNOSIS — Z86711 Personal history of pulmonary embolism: Secondary | ICD-10-CM | POA: Diagnosis not present

## 2022-01-28 DIAGNOSIS — I480 Paroxysmal atrial fibrillation: Secondary | ICD-10-CM | POA: Diagnosis not present

## 2022-01-28 DIAGNOSIS — D631 Anemia in chronic kidney disease: Secondary | ICD-10-CM | POA: Diagnosis not present

## 2022-01-28 DIAGNOSIS — R404 Transient alteration of awareness: Secondary | ICD-10-CM | POA: Insufficient documentation

## 2022-01-28 DIAGNOSIS — J410 Simple chronic bronchitis: Secondary | ICD-10-CM | POA: Diagnosis not present

## 2022-01-28 DIAGNOSIS — N189 Chronic kidney disease, unspecified: Secondary | ICD-10-CM | POA: Insufficient documentation

## 2022-01-28 DIAGNOSIS — N1832 Chronic kidney disease, stage 3b: Secondary | ICD-10-CM | POA: Diagnosis not present

## 2022-01-28 DIAGNOSIS — I495 Sick sinus syndrome: Secondary | ICD-10-CM | POA: Diagnosis not present

## 2022-01-28 DIAGNOSIS — L02212 Cutaneous abscess of back [any part, except buttock]: Secondary | ICD-10-CM

## 2022-01-28 DIAGNOSIS — I959 Hypotension, unspecified: Secondary | ICD-10-CM | POA: Diagnosis not present

## 2022-01-28 DIAGNOSIS — N179 Acute kidney failure, unspecified: Secondary | ICD-10-CM | POA: Diagnosis not present

## 2022-01-28 DIAGNOSIS — R64 Cachexia: Secondary | ICD-10-CM | POA: Diagnosis not present

## 2022-01-28 DIAGNOSIS — I120 Hypertensive chronic kidney disease with stage 5 chronic kidney disease or end stage renal disease: Secondary | ICD-10-CM | POA: Diagnosis not present

## 2022-01-28 DIAGNOSIS — L02413 Cutaneous abscess of right upper limb: Secondary | ICD-10-CM | POA: Diagnosis not present

## 2022-01-28 DIAGNOSIS — D509 Iron deficiency anemia, unspecified: Secondary | ICD-10-CM | POA: Diagnosis not present

## 2022-01-28 DIAGNOSIS — R55 Syncope and collapse: Secondary | ICD-10-CM

## 2022-01-28 DIAGNOSIS — C7A019 Malignant carcinoid tumor of the small intestine, unspecified portion: Secondary | ICD-10-CM | POA: Diagnosis not present

## 2022-01-28 DIAGNOSIS — Z681 Body mass index (BMI) 19 or less, adult: Secondary | ICD-10-CM | POA: Diagnosis not present

## 2022-01-28 DIAGNOSIS — F17290 Nicotine dependence, other tobacco product, uncomplicated: Secondary | ICD-10-CM | POA: Diagnosis not present

## 2022-01-28 DIAGNOSIS — D649 Anemia, unspecified: Secondary | ICD-10-CM | POA: Diagnosis not present

## 2022-01-28 DIAGNOSIS — I129 Hypertensive chronic kidney disease with stage 1 through stage 4 chronic kidney disease, or unspecified chronic kidney disease: Secondary | ICD-10-CM | POA: Diagnosis not present

## 2022-01-28 DIAGNOSIS — C786 Secondary malignant neoplasm of retroperitoneum and peritoneum: Secondary | ICD-10-CM | POA: Diagnosis not present

## 2022-01-28 DIAGNOSIS — I951 Orthostatic hypotension: Secondary | ICD-10-CM | POA: Diagnosis not present

## 2022-01-28 DIAGNOSIS — Z743 Need for continuous supervision: Secondary | ICD-10-CM | POA: Diagnosis not present

## 2022-01-28 DIAGNOSIS — E872 Acidosis, unspecified: Secondary | ICD-10-CM | POA: Diagnosis not present

## 2022-01-28 DIAGNOSIS — E340 Carcinoid syndrome: Secondary | ICD-10-CM | POA: Diagnosis not present

## 2022-01-28 DIAGNOSIS — E43 Unspecified severe protein-calorie malnutrition: Secondary | ICD-10-CM | POA: Diagnosis not present

## 2022-01-28 DIAGNOSIS — K912 Postsurgical malabsorption, not elsewhere classified: Secondary | ICD-10-CM | POA: Diagnosis not present

## 2022-01-28 DIAGNOSIS — E860 Dehydration: Secondary | ICD-10-CM | POA: Diagnosis not present

## 2022-01-28 DIAGNOSIS — C787 Secondary malignant neoplasm of liver and intrahepatic bile duct: Secondary | ICD-10-CM | POA: Diagnosis not present

## 2022-01-28 LAB — CHROMOGRANIN A: Chromogranin A (ng/mL): 1742 ng/mL — ABNORMAL HIGH (ref 0.0–101.8)

## 2022-01-28 LAB — FERRITIN: Ferritin: 135 ng/mL (ref 24–336)

## 2022-01-28 MED ORDER — ALBUTEROL SULFATE HFA 108 (90 BASE) MCG/ACT IN AERS: 2.0000 | INHALATION_SPRAY | Freq: Four times a day (QID) | RESPIRATORY_TRACT | 5 refills | Status: AC | PRN

## 2022-01-28 MED ORDER — SYMBICORT 160-4.5 MCG/ACT IN AERO: 2.0000 | INHALATION_SPRAY | Freq: Every day | RESPIRATORY_TRACT | 1 refills | Status: AC | PRN

## 2022-01-28 NOTE — Patient Instructions (Signed)
Incision and Drainage Incision and drainage is a surgical procedure to open and drain a fluid-filled sac. The sac may be filled with pus, mucus, or blood. Examples of fluid-filled sacs that may need surgical drainage include cysts, skin infections (abscesses), and red lumps that develop from a ruptured cyst or a small abscess (boils). You may need this procedure if the affected area is large, painful, infected, or not healing well. Tell a health care provider about: Any allergies you have. All medicines you are taking, including vitamins, herbs, eye drops, creams, and over-the-counter medicines. Any problems you or family members have had with anesthetic medicines. Any blood disorders you have or have had. Any surgeries you have had. Any medical conditions you have or have had. Whether you are pregnant or may be pregnant. What are the risks? Generally, this is a safe procedure. However, problems may occur, including: Infection. Bleeding. Allergic reactions to medicines. Scarring. The cyst or abscess returns. Damage to nerves or vessels. What happens before the procedure? Medicine Ask your health care provider about: Changing or stopping your regular medicines. This is especially important if you are taking diabetes medicines or blood thinners. Taking medicines such as aspirin and ibuprofen. These medicines can thin your blood. Do not take these medicines unless your health care provider tells you to take them. Taking over-the-counter medicines, vitamins, herbs, and supplements. Tests You may have an exam or testing. These may include: Ultrasound or other imaging tests to see how large or deep the fluid-filled sac is. Blood tests to check for infection. General instructions Follow instructions from your health care provider about eating or drinking restrictions. Plan to have someone take you home from the hospital or clinic. Ask your health care provider whether a responsible adult  should care for you for at least 24 hours after you leave the hospital or clinic. This is important. You may get a tetanus shot. Ask your health care provider: How your surgery site will be marked or identified. What steps will be taken to help prevent infection. These may include: Removing hair at the surgery site. Washing skin with a germ-killing soap. Receiving antibiotic medicine. What happens during the procedure?  An IV may be inserted into one of your veins. You will be given one or more of the following: A medicine to help you relax (sedative). A medicine to numb the area (local anesthetic). A medicine to make you fall asleep (general anesthetic). An incision will be made in the top of the fluid-filled sac. Pus, blood, and mucus will be squeezed out, and a syringe or tube (drain) may be used to empty more fluid from the sac. Your health care provider will do one of the following. He or she may: Leave the drain in place for several weeks to drain more fluid. Stitch open the edges of the incision to make a long-term opening for drainage (marsupialization). The inside of the sac may be washed out (irrigated) with a sterile solution and packed with gauze before it is covered with a bandage (dressing). Your health care provider may do a culture test of the drainage fluid. The procedure may vary among health care providers and hospitals. What happens after the procedure? Your blood pressure, heart rate, breathing rate, and blood oxygen level will be monitored often until you leave the hospital or clinic. Do not drive for 24 hours if you were given a sedative during your procedure. Summary Incision and drainage is a surgical procedure to open and drain a fluid-filled  sac. The sac may be filled with pus, mucus, or blood. Before the procedure, you may be given antibiotic medicine to treat or help prevent infection. During the procedure, an incision will be made in the top of the  fluid-filled sac. Pus, blood, and mucus is squeezed out, and a syringe or tube (drain) may be used to empty more fluid from the sac. The inside of the sac may be washed out (irrigated) with a sterile solution and packed with gauze before it is covered with a bandage (dressing). This information is not intended to replace advice given to you by your health care provider. Make sure you discuss any questions you have with your health care provider. Document Revised: 03/27/2021 Document Reviewed: 03/27/2021 Elsevier Patient Education  [place].

## 2022-01-28 NOTE — Procedures (Signed)
ELECTROENCEPHALOGRAM REPORT  Date of Study: 01/28/2022  Patient's Name: Alan [name] MRN: [id number] Date of Birth: [birth year]  Referring Provider: Dr. Ellouise Newer  Clinical History: This is an 81 year old man with recurrent dizziness, staring episodes, and syncope. EEG for classification.  Medications: Aspirin, Questran, Lomotil, Marinol, Florinef, Proamantine, Remeron, Thiamine  Technical Summary: A multichannel digital EEG recording measured by the international 10-20 system with electrodes applied with paste and impedances below 5000 ohms performed in our laboratory with EKG monitoring in an awake and asleep patient.  Hyperventilation was not performed. Photic stimulation was performed.  The digital EEG was referentially recorded, reformatted, and digitally filtered in a variety of bipolar and referential montages for optimal display.    Description: The patient is awake and asleep during the recording.  During maximal wakefulness, there is a symmetric, medium voltage 8 Hz posterior dominant rhythm that attenuates with eye opening.  The record is symmetric.  During drowsiness and sleep, there is an increase in theta slowing of the background with vertex waves seen. Photic stimulation did not elicit any abnormalities.  There were no epileptiform discharges or electrographic seizures seen.    EKG lead was unremarkable.  Impression: This awake and asleep EEG is normal.    Clinical Correlation: A normal EEG does not exclude a clinical diagnosis of epilepsy.  If further clinical questions remain, prolonged EEG may be helpful.  Clinical correlation is advised.   Ellouise Newer, M.D.

## 2022-01-28 NOTE — Progress Notes (Signed)
EEG completed, results pending. 

## 2022-01-28 NOTE — Progress Notes (Unsigned)
Subjective:  Patient ID: Alan [name], male    DOB: [birth year]  Age: 81 y.o. MRN: [id number]  CC: Abscess   HPI Alan [name] presents for an urgent visit - He complains of a 2 week hx of pain, redness, swelling, and purulent discharge from an area over his right shoulder blade.  Outpatient Medications Prior to Visit  Medication Sig Dispense Refill   aspirin 81 MG EC tablet Take 81 mg by mouth daily. Swallow whole.     cholestyramine (QUESTRAN) 4 g packet MIX AND DRINK 1 PACKET(4 GRAMS) BY MOUTH TWICE DAILY WITH A MEAL 180 each 0   diphenoxylate-atropine (LOMOTIL) 2.5-0.025 MG tablet Take 1 tablet by mouth 2 (two) times daily as needed for diarrhea or loose stools. 30 tablet 0   dronabinol (MARINOL) 2.5 MG capsule Take 1 capsule (2.5 mg total) by mouth 2 (two) times daily before a meal. 60 capsule 0   feeding supplement, ENSURE COMPLETE, (ENSURE COMPLETE) LIQD Take 237 mLs by mouth 2 (two) times daily between meals.     fludrocortisone (FLORINEF) 0.1 MG tablet Take 1 tablet (0.1 mg total) by mouth daily. 90 tablet 3   iron polysaccharides (NIFEREX) 150 MG capsule TAKE 1 CAPSULE(150 MG) BY MOUTH 3 TIMES A WEEK 30 capsule 2   LANREOTIDE ACETATE [place] Inject 1 Dose into the skin every 30 (thirty) days.     loperamide (IMODIUM A-D) 2 MG capsule Take by mouth as needed for diarrhea or loose stools.     midodrine (PROAMATINE) 5 MG tablet Take 3 tablets (15 mg total) by mouth 3 (three) times daily with meals. 270 tablet 5   mirtazapine (REMERON) 30 MG tablet Take 1 tablet (30 mg total) by mouth daily. 90 tablet 3   Multiple Vitamins-Minerals (PRESERVISION AREDS 2 PO) Take 1 capsule by mouth daily.     multivitamin (ONE-A-DAY MEN'S) TABS tablet Take 1 tablet by mouth daily.     Potassium Chloride ER 20 MEQ TBCR Take 1 tablet by mouth 2 (two) times daily. 60 tablet 5   thiamine (VITAMIN B-1) 50 MG tablet Take 1 tablet (50 mg total) by mouth daily. 90 tablet 1   albuterol (VENTOLIN HFA) 108  (90 Base) MCG/ACT inhaler Inhale 2 puffs into the lungs every 6 (six) hours as needed for wheezing or shortness of breath. 18 g 5   SYMBICORT 160-4.5 MCG/ACT inhaler Inhale 2 puffs into the lungs daily as needed (wheezing). 3 each 1   No facility-administered medications prior to visit.    ROS Review of Systems  Constitutional:  Positive for fatigue. Negative for chills and fever.  HENT: Negative.    Eyes: Negative.   Respiratory:  Positive for shortness of breath. Negative for cough.   Cardiovascular:  Negative for chest pain, palpitations and leg swelling.  Gastrointestinal:  Positive for diarrhea. Negative for abdominal pain, nausea and vomiting.  Genitourinary:  Negative for difficulty urinating.  Neurological: Negative.  Negative for dizziness and weakness.  Hematological:  Negative for adenopathy. Does not bruise/bleed easily.  Psychiatric/Behavioral: Negative.      Objective:  BP (!) 146/78 (BP Location: Left Arm, Patient Position: Sitting, Cuff Size: Small)   Pulse 67   Temp 97.6 F (36.4 C) (Oral)   Ht '6\' 1"'$  (1.854 m)   Wt 133 lb 6 oz (60.5 kg)   SpO2 97%   BMI 17.60 kg/m   BP Readings from Last 3 Encounters:  01/29/22 (!) 186/115  01/28/22 (!) 146/78  01/27/22 Marland Kitchen)  146/93    Wt Readings from Last 3 Encounters:  01/28/22 133 lb 6 oz (60.5 kg)  01/09/22 136 lb 3.2 oz (61.8 kg)  12/04/21 136 lb (61.7 kg)    Physical Exam Vitals reviewed.  Constitutional:      General: He is not in acute distress.    Appearance: He is ill-appearing. He is not toxic-appearing or diaphoretic.  Musculoskeletal:       Arms:     Lab Results  Component Value Date   WBC 7.4 01/29/2022   HGB 9.5 (L) 01/29/2022   HCT 27.1 (L) 01/29/2022   PLT 185 01/29/2022   GLUCOSE 112 (H) 01/27/2022   ALT 13 01/27/2022   AST 14 (L) 01/27/2022   NA 141 01/27/2022   K 4.4 01/27/2022   CL 108 01/27/2022   CREATININE 3.68 (HH) 01/27/2022   BUN 49 (H) 01/27/2022   CO2 25 01/27/2022   TSH  1.591 04/22/2021   INR 1.0 01/16/2021   HGBA1C 5.1 01/16/2021    EEG adult  Result Date: 01/28/2022 Cameron Sprang, MD     01/28/2022 11:08 AM ELECTROENCEPHALOGRAM REPORT Date of Study: 01/28/2022 Patient's Name: Alan [name] MRN: [id number] Date of Birth: [birth year] Referring Provider: Dr. Ellouise Newer Clinical History: This is an 81 year old man with recurrent dizziness, staring episodes, and syncope. EEG for classification. Medications: Aspirin, Questran, Lomotil, Marinol, Florinef, Proamantine, Remeron, Thiamine Technical Summary: A multichannel digital EEG recording measured by the international 10-20 system with electrodes applied with paste and impedances below 5000 ohms performed in our laboratory with EKG monitoring in an awake and asleep patient.  Hyperventilation was not performed. Photic stimulation was performed.  The digital EEG was referentially recorded, reformatted, and digitally filtered in a variety of bipolar and referential montages for optimal display.  Description: The patient is awake and asleep during the recording.  During maximal wakefulness, there is a symmetric, medium voltage 8 Hz posterior dominant rhythm that attenuates with eye opening.  The record is symmetric.  During drowsiness and sleep, there is an increase in theta slowing of the background with vertex waves seen. Photic stimulation did not elicit any abnormalities.  There were no epileptiform discharges or electrographic seizures seen.  EKG lead was unremarkable. Impression: This awake and asleep EEG is normal.  Clinical Correlation: A normal EEG does not exclude a clinical diagnosis of epilepsy.  If further clinical questions remain, prolonged EEG may be helpful.  Clinical correlation is advised. Ellouise Newer, M.D.   After informed verbal consent was obtained. Using Betadine for cleansing and 1% Lidocaine with epinephrine for anesthetia (2 cc's used), with sterile technique a 6 mm punch incision was made and a cavity was  found with copious purulent exudate and several loculations. The cavity was cultured and irrigated with H2O2 and Qtips. The loculations were disrupted. The cavity was packed with iodoform. Hemostasis was obtained by pressure. The specimen is labeled and sent. The procedure was well tolerated without complications.    Assessment & Plan:   Blaire was seen today for abscess.  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 -  SYMBICORT 160-4.5 MCG/ACT inhaler; Inhale 2 puffs into the lungs daily as needed (wheezing).  Essential hypertension- His blood pressure is adequately well controlled.  Back abscess- I&D performed - This should be adequate to treat the infection.  Will culture in the event that antibiotics are needed.  He will return in 2 days to have the packing removed. -     WOUND CULTURE; Future -     WOUND CULTURE   I am having Josie Saunders. Mickiewicz maintain his Multiple Vitamins-Minerals (PRESERVISION AREDS 2 PO), aspirin EC, LANREOTIDE ACETATE [place], multivitamin, fludrocortisone, iron polysaccharides, feeding supplement (ENSURE COMPLETE), dronabinol, loperamide, Potassium Chloride ER, midodrine, diphenoxylate-atropine, cholestyramine, thiamine, mirtazapine, albuterol, and Symbicort.  Meds ordered this encounter  Medications   albuterol (VENTOLIN HFA) 108 (90 Base) MCG/ACT inhaler    Sig: Inhale 2 puffs into the lungs every 6 (six) hours as needed for wheezing or shortness of breath.    Dispense:  18 g    Refill:  5   SYMBICORT 160-4.5 MCG/ACT inhaler    Sig: Inhale 2 puffs into the lungs daily as needed (wheezing).    Dispense:  3 each     Refill:  1     Follow-up: Return in about 1 day (around 01/29/2022).  Scarlette Calico, MD

## 2022-01-29 ENCOUNTER — Inpatient Hospital Stay (HOSPITAL_COMMUNITY)
Admission: EM | Admit: 2022-01-29 | Discharge: 2022-02-01 | DRG: 312 | Disposition: A | Discharge status: Home / Self Care | Payer: Medicare Other | Attending: Internal Medicine | Admitting: Internal Medicine

## 2022-01-29 ENCOUNTER — Encounter (HOSPITAL_COMMUNITY): Payer: Self-pay

## 2022-01-29 ENCOUNTER — Telehealth: Payer: Self-pay

## 2022-01-29 ENCOUNTER — Encounter: Payer: Self-pay | Admitting: Internal Medicine

## 2022-01-29 ENCOUNTER — Other Ambulatory Visit: Payer: Self-pay

## 2022-01-29 ENCOUNTER — Emergency Department (HOSPITAL_COMMUNITY): Payer: Medicare Other

## 2022-01-29 DIAGNOSIS — E860 Dehydration: Secondary | ICD-10-CM | POA: Diagnosis present

## 2022-01-29 DIAGNOSIS — I951 Orthostatic hypotension: Principal | ICD-10-CM | POA: Diagnosis present

## 2022-01-29 DIAGNOSIS — J45909 Unspecified asthma, uncomplicated: Secondary | ICD-10-CM | POA: Diagnosis present

## 2022-01-29 DIAGNOSIS — Z820 Family history of epilepsy and other diseases of the nervous system: Secondary | ICD-10-CM

## 2022-01-29 DIAGNOSIS — E872 Acidosis, unspecified: Secondary | ICD-10-CM | POA: Diagnosis present

## 2022-01-29 DIAGNOSIS — K529 Noninfective gastroenteritis and colitis, unspecified: Secondary | ICD-10-CM | POA: Diagnosis present

## 2022-01-29 DIAGNOSIS — E340 Carcinoid syndrome: Secondary | ICD-10-CM | POA: Diagnosis present

## 2022-01-29 DIAGNOSIS — L02413 Cutaneous abscess of right upper limb: Secondary | ICD-10-CM | POA: Diagnosis present

## 2022-01-29 DIAGNOSIS — Z950 Presence of cardiac pacemaker: Secondary | ICD-10-CM

## 2022-01-29 DIAGNOSIS — Z7982 Long term (current) use of aspirin: Secondary | ICD-10-CM

## 2022-01-29 DIAGNOSIS — E876 Hypokalemia: Secondary | ICD-10-CM | POA: Diagnosis present

## 2022-01-29 DIAGNOSIS — C786 Secondary malignant neoplasm of retroperitoneum and peritoneum: Secondary | ICD-10-CM | POA: Diagnosis present

## 2022-01-29 DIAGNOSIS — N179 Acute kidney failure, unspecified: Secondary | ICD-10-CM | POA: Diagnosis present

## 2022-01-29 DIAGNOSIS — R5381 Other malaise: Secondary | ICD-10-CM | POA: Diagnosis present

## 2022-01-29 DIAGNOSIS — R55 Syncope and collapse: Principal | ICD-10-CM

## 2022-01-29 DIAGNOSIS — Z888 Allergy status to other drugs, medicaments and biological substances status: Secondary | ICD-10-CM

## 2022-01-29 DIAGNOSIS — N185 Chronic kidney disease, stage 5: Secondary | ICD-10-CM | POA: Diagnosis present

## 2022-01-29 DIAGNOSIS — I480 Paroxysmal atrial fibrillation: Secondary | ICD-10-CM | POA: Diagnosis present

## 2022-01-29 DIAGNOSIS — C7A019 Malignant carcinoid tumor of the small intestine, unspecified portion: Secondary | ICD-10-CM | POA: Diagnosis present

## 2022-01-29 DIAGNOSIS — I959 Hypotension, unspecified: Secondary | ICD-10-CM | POA: Diagnosis present

## 2022-01-29 DIAGNOSIS — L02212 Cutaneous abscess of back [any part, except buttock]: Secondary | ICD-10-CM | POA: Diagnosis present

## 2022-01-29 DIAGNOSIS — E43 Unspecified severe protein-calorie malnutrition: Secondary | ICD-10-CM | POA: Diagnosis present

## 2022-01-29 DIAGNOSIS — F17290 Nicotine dependence, other tobacco product, uncomplicated: Secondary | ICD-10-CM | POA: Diagnosis present

## 2022-01-29 DIAGNOSIS — Z7951 Long term (current) use of inhaled steroids: Secondary | ICD-10-CM

## 2022-01-29 DIAGNOSIS — R64 Cachexia: Secondary | ICD-10-CM | POA: Diagnosis present

## 2022-01-29 DIAGNOSIS — D631 Anemia in chronic kidney disease: Secondary | ICD-10-CM | POA: Diagnosis present

## 2022-01-29 DIAGNOSIS — I120 Hypertensive chronic kidney disease with stage 5 chronic kidney disease or end stage renal disease: Secondary | ICD-10-CM | POA: Diagnosis present

## 2022-01-29 DIAGNOSIS — Z86711 Personal history of pulmonary embolism: Secondary | ICD-10-CM

## 2022-01-29 DIAGNOSIS — Z681 Body mass index (BMI) 19 or less, adult: Secondary | ICD-10-CM

## 2022-01-29 DIAGNOSIS — I4430 Unspecified atrioventricular block: Secondary | ICD-10-CM | POA: Diagnosis present

## 2022-01-29 DIAGNOSIS — Z79899 Other long term (current) drug therapy: Secondary | ICD-10-CM

## 2022-01-29 DIAGNOSIS — D509 Iron deficiency anemia, unspecified: Secondary | ICD-10-CM | POA: Diagnosis present

## 2022-01-29 DIAGNOSIS — F17210 Nicotine dependence, cigarettes, uncomplicated: Secondary | ICD-10-CM | POA: Diagnosis present

## 2022-01-29 DIAGNOSIS — N400 Enlarged prostate without lower urinary tract symptoms: Secondary | ICD-10-CM | POA: Diagnosis present

## 2022-01-29 DIAGNOSIS — C787 Secondary malignant neoplasm of liver and intrahepatic bile duct: Secondary | ICD-10-CM | POA: Diagnosis present

## 2022-01-29 DIAGNOSIS — N189 Chronic kidney disease, unspecified: Secondary | ICD-10-CM | POA: Diagnosis present

## 2022-01-29 DIAGNOSIS — Z880 Allergy status to penicillin: Secondary | ICD-10-CM

## 2022-01-29 DIAGNOSIS — I495 Sick sinus syndrome: Secondary | ICD-10-CM | POA: Diagnosis present

## 2022-01-29 DIAGNOSIS — I10 Essential (primary) hypertension: Secondary | ICD-10-CM | POA: Diagnosis present

## 2022-01-29 DIAGNOSIS — R778 Other specified abnormalities of plasma proteins: Secondary | ICD-10-CM | POA: Diagnosis present

## 2022-01-29 DIAGNOSIS — K912 Postsurgical malabsorption, not elsewhere classified: Secondary | ICD-10-CM | POA: Diagnosis present

## 2022-01-29 DIAGNOSIS — N2889 Other specified disorders of kidney and ureter: Secondary | ICD-10-CM | POA: Diagnosis present

## 2022-01-29 LAB — URINALYSIS, ROUTINE W REFLEX MICROSCOPIC
Bacteria, UA: NONE SEEN
Bilirubin Urine: NEGATIVE
Glucose, UA: NEGATIVE mg/dL
Ketones, ur: NEGATIVE mg/dL
Leukocytes,Ua: NEGATIVE
Nitrite: NEGATIVE
Protein, ur: 100 mg/dL — AB
Specific Gravity, Urine: 1.009 (ref 1.005–1.030)
pH: 5 (ref 5.0–8.0)

## 2022-01-29 LAB — COMPREHENSIVE METABOLIC PANEL
ALT: 16 U/L (ref 0–44)
AST: 18 U/L (ref 15–41)
Albumin: 3.8 g/dL (ref 3.5–5.0)
Alkaline Phosphatase: 50 U/L (ref 38–126)
Anion gap: 12 (ref 5–15)
BUN: 50 mg/dL — ABNORMAL HIGH (ref 8–23)
CO2: 20 mmol/L — ABNORMAL LOW (ref 22–32)
Calcium: 9.2 mg/dL (ref 8.9–10.3)
Chloride: 106 mmol/L (ref 98–111)
Creatinine, Ser: 3.83 mg/dL — ABNORMAL HIGH (ref 0.61–1.24)
GFR, Estimated: 15 mL/min — ABNORMAL LOW (ref >60)
Glucose, Bld: 128 mg/dL — ABNORMAL HIGH (ref 70–99)
Potassium: 4.1 mmol/L (ref 3.5–5.1)
Sodium: 138 mmol/L (ref 135–145)
Total Bilirubin: 0.7 mg/dL (ref 0.3–1.2)
Total Protein: 6.6 g/dL (ref 6.5–8.1)

## 2022-01-29 LAB — CBC WITH DIFFERENTIAL/PLATELET
Abs Immature Granulocytes: 0.02 10*3/uL (ref 0.00–0.07)
Basophils Absolute: 0 10*3/uL (ref 0.0–0.1)
Basophils Relative: 0 %
Eosinophils Absolute: 0 10*3/uL (ref 0.0–0.5)
Eosinophils Relative: 0 %
HCT: 27.1 % — ABNORMAL LOW (ref 39.0–52.0)
Hemoglobin: 9.5 g/dL — ABNORMAL LOW (ref 13.0–17.0)
Immature Granulocytes: 0 %
Lymphocytes Relative: 13 %
Lymphs Abs: 1 10*3/uL (ref 0.7–4.0)
MCH: 30.7 pg (ref 26.0–34.0)
MCHC: 35.1 g/dL (ref 30.0–36.0)
MCV: 87.7 fL (ref 80.0–100.0)
Monocytes Absolute: 0.5 10*3/uL (ref 0.1–1.0)
Monocytes Relative: 6 %
Neutro Abs: 5.9 10*3/uL (ref 1.7–7.7)
Neutrophils Relative %: 81 %
Platelets: 185 10*3/uL (ref 150–400)
RBC: 3.09 MIL/uL — ABNORMAL LOW (ref 4.22–5.81)
RDW: 13.5 % (ref 11.5–15.5)
WBC: 7.4 10*3/uL (ref 4.0–10.5)
nRBC: 0 % (ref 0.0–0.2)

## 2022-01-29 LAB — CREATININE, SERUM
Creatinine, Ser: 3.52 mg/dL — ABNORMAL HIGH (ref 0.61–1.24)
GFR, Estimated: 17 mL/min — ABNORMAL LOW (ref >60)

## 2022-01-29 LAB — TROPONIN I (HIGH SENSITIVITY)
Troponin I (High Sensitivity): 25 ng/L — ABNORMAL HIGH (ref <18)
Troponin I (High Sensitivity): 30 ng/L — ABNORMAL HIGH (ref <18)

## 2022-01-29 LAB — CBC
HCT: 25.6 % — ABNORMAL LOW (ref 39.0–52.0)
Hemoglobin: 9.2 g/dL — ABNORMAL LOW (ref 13.0–17.0)
MCH: 31.3 pg (ref 26.0–34.0)
MCHC: 35.9 g/dL (ref 30.0–36.0)
MCV: 87.1 fL (ref 80.0–100.0)
Platelets: 160 10*3/uL (ref 150–400)
RBC: 2.94 MIL/uL — ABNORMAL LOW (ref 4.22–5.81)
RDW: 13.3 % (ref 11.5–15.5)
WBC: 6.4 10*3/uL (ref 4.0–10.5)
nRBC: 0 % (ref 0.0–0.2)

## 2022-01-29 LAB — LIPASE, BLOOD: Lipase: 36 U/L (ref 11–51)

## 2022-01-29 MED ORDER — MIRTAZAPINE 30 MG PO TABS: 30.0000 mg | ORAL_TABLET | Freq: Every day | ORAL | Status: DC

## 2022-01-29 MED ORDER — DRONABINOL 2.5 MG PO CAPS
2.5000 mg | ORAL_CAPSULE | Freq: Two times a day (BID) | ORAL | Status: DC
Start: 2022-01-30 — End: 2022-01-30

## 2022-01-29 MED ORDER — MIDODRINE HCL 5 MG PO TABS
15.0000 mg | ORAL_TABLET | Freq: Three times a day (TID) | ORAL | Status: DC
  Administered 2022-01-30: 15 mg via ORAL
  Filled 2022-01-29: qty 3

## 2022-01-29 MED ORDER — HEPARIN SODIUM (PORCINE) 5000 UNIT/ML IJ SOLN
5000.0000 [IU] | Freq: Three times a day (TID) | INTRAMUSCULAR | Status: DC
  Administered 2022-01-29 – 2022-02-01 (×8): 5000 [IU] via SUBCUTANEOUS
  Filled 2022-01-29 (×8): qty 1

## 2022-01-29 MED ORDER — SODIUM CHLORIDE 0.9 % IV BOLUS
500.0000 mL | Freq: Once | INTRAVENOUS | Status: AC
  Administered 2022-01-29: 500 mL via INTRAVENOUS

## 2022-01-29 MED ORDER — FLUDROCORTISONE ACETATE 0.1 MG PO TABS: 0.1000 mg | ORAL_TABLET | Freq: Every day | ORAL | Status: DC

## 2022-01-29 NOTE — ED Notes (Signed)
Received verbal report from Alison D RN at this time 

## 2022-01-29 NOTE — ED Triage Notes (Signed)
BIB [facility] from home. Increasing episodes of syncope past several weeks. 2 episodes today. Diagnosed with orthostatic hypotension- shows the same with EMS vitals.

## 2022-01-29 NOTE — Telephone Encounter (Signed)
Noted  

## 2022-01-29 NOTE — ED Notes (Signed)
Was completing orthostatic VS and when he was standing waiting for the 3 min pt became light headed and started to feel like he was going to pass out. Pt was assisted back to bed and had a short episode of not responding. Provider made aware of same

## 2022-01-29 NOTE — ED Notes (Signed)
MD Hal Hope notified of pt's hypertension.

## 2022-01-29 NOTE — H&P (Signed)
History and Physical    Alan [name] JIR:[id number] DOB: 10-01-1940 DOA: 01/29/2022  PCP: [name], MD  Patient coming from: Home.  Chief Complaint: Loss of consciousness.  HPI: Alan [name] is a 81 y.o. 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  Patient states he used to have syncopal episodes due to his orthostatic hypotension but over the last 2 weeks he has been frequent more than usual.  This evening when he was sitting he had a syncopal episode.  This happened when he was trying to stand up.  Denies any chest pain or shortness of breath.  2 days ago patient had a draining abscess on the right posterior aspect of the shoulder by the primary care physician.  ED Course: In the ER patient had significant drop in his blood pressure from 751 systolic he dropped to 81 standing for 3 minutes.  Patient was given fluid bolus admitted for further observation.  Patient's creatinine also has worsened from 2.6 on December 29, 2021 it is around 3.6 now.  EKG shows normal sinus rhythm and pacemaker was interrogated which did not show any acute.  Review of Systems: As per HPI, rest all negative.   Past Medical History:  Diagnosis Date   Asthma    Carcinoid tumor, small intestine, malignant (HCC)    Chronic diarrhea    CKD (chronic kidney disease) stage 3, GFR 30-59 ml/min (HCC)    HTN (hypertension)    Short gut syndrome     Past Surgical History:  Procedure Laterality Date   COLON SURGERY     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   Procedure: PACEMAKER IMPLANT;  Surgeon: Constance Haw, MD;  Location: [facility];  Service: Cardiovascular;  Laterality: N/A;     reports that he has been smoking cigars and cigarettes. He started smoking about 63 years ago. He has a 18.30 pack-year smoking history. He has never used smokeless tobacco. He reports that he does not currently use alcohol. He reports that he does not use drugs.  Allergies  Allergen Reactions   Penicillins Hives and Shortness Of Breath   Lisinopril Itching    Family History  Problem Relation Age of Onset   Alzheimer's disease Mother     Prior to Admission medications   Medication Sig Start Date End Date Taking? Authorizing Provider  albuterol (VENTOLIN HFA) 108 (90 Base) MCG/ACT inhaler Inhale 2 puffs into the lungs every 6 (six) hours as needed for wheezing or shortness of breath. 01/28/22   [name], MD  aspirin 81 MG EC tablet Take 81 mg by mouth daily. Swallow whole.    [provider]  cholestyramine (QUESTRAN) 4 g packet MIX AND DRINK 1 PACKET(4 GRAMS) BY MOUTH TWICE DAILY WITH A MEAL 11/26/21   Brunetta Genera, MD  diphenoxylate-atropine (LOMOTIL) 2.5-0.025 MG tablet Take 1 tablet by mouth 2 (two) times daily as needed for diarrhea or loose stools. 11/26/21   Brunetta Genera, MD  dronabinol (MARINOL) 2.5 MG capsule Take 1 capsule (2.5 mg total) by mouth 2 (two) times daily before a meal. 10/23/21   Barb Merino, MD  feeding supplement, ENSURE COMPLETE, (ENSURE COMPLETE)  LIQD Take 237 mLs by mouth 2 (two) times daily between meals.    [provider]  fludrocortisone (FLORINEF) 0.1 MG tablet Take 1 tablet (0.1 mg total) by mouth daily. 05/15/21   Jerline Pain, MD  iron polysaccharides (NIFEREX) 150 MG capsule TAKE 1 CAPSULE(150 MG) BY MOUTH 3 TIMES A WEEK 09/22/21   Brunetta Genera, MD  LANREOTIDE ACETATE [place] Inject 1 Dose into the skin every 30 (thirty) days.    [provider]  loperamide (IMODIUM A-D) 2 MG capsule Take by mouth as needed for diarrhea or loose stools.     [provider]  midodrine (PROAMATINE) 5 MG tablet Take 3 tablets (15 mg total) by mouth 3 (three) times daily with meals. 11/10/21   Biagio Borg, MD  mirtazapine (REMERON) 30 MG tablet Take 1 tablet (30 mg total) by mouth daily. 01/12/22 01/07/23  Reed, Tiffany L, DO  Multiple Vitamins-Minerals (PRESERVISION AREDS 2 PO) Take 1 capsule by mouth daily.    [provider]  multivitamin (ONE-A-DAY MEN'S) TABS tablet Take 1 tablet by mouth daily.    [provider]  Potassium Chloride ER 20 MEQ TBCR Take 1 tablet by mouth 2 (two) times daily. 11/10/21   Biagio Borg, MD  SYMBICORT 160-4.5 MCG/ACT inhaler Inhale 2 puffs into the lungs daily as needed (wheezing). 01/28/22   [name], MD  thiamine (VITAMIN B-1) 50 MG tablet Take 1 tablet (50 mg total) by mouth daily. 12/10/21   [name], MD    Physical Exam: Constitutional: Moderately built and nourished. Vitals:   01/29/22 2014 01/29/22 2045 01/29/22 2100 01/29/22 2115  BP:  105/72    Pulse:  66  72  Resp:  18  20  Temp:   97.8 F (36.6 C)   TempSrc:   Oral   SpO2:  100%  100%  Weight: 60.5 kg     Height: '6\' 1"'$  (1.854 m)      Eyes: Anicteric no pallor. ENMT: No discharge from the ears eyes nose and mouth. Neck: No mass felt.  No neck rigidity. Respiratory: No rhonchi or crepitations. Cardiovascular: S1-S2 heard. Abdomen: Soft nontender bowel sound present. Musculoskeletal: No edema. Skin: Draining abscess on the right shoulder.  Posterior aspect. Neurologic: Alert awake oriented time place and person.  Moves all extremities. Psychiatric: Appears normal.  Normal affect.   Labs on Admission: I have personally reviewed following labs and imaging studies  CBC: Recent Labs  Lab 01/27/22 1510 01/29/22 1659  WBC 7.0 7.4  NEUTROABS 4.7 5.9  HGB 10.0* 9.5*  HCT 28.6* 27.1*  MCV 87.5 87.7  PLT 196 381   Basic Metabolic Panel: Recent Labs  Lab 01/27/22 1510 01/29/22 1659  NA 141 138  K 4.4  4.1  CL 108 106  CO2 25 20*  GLUCOSE 112* 128*  BUN 49* 50*  CREATININE 3.68* 3.83*  CALCIUM 9.3 9.2   GFR: Estimated Creatinine Clearance: 13.2 mL/min (A) (by C-G formula based on SCr of 3.83 mg/dL (H)). Liver Function Tests: Recent Labs  Lab 01/27/22 1510 01/29/22 1659  AST 14* 18  ALT 13 16  ALKPHOS 48 50  BILITOT 0.6 0.7  PROT 7.3 6.6  ALBUMIN 4.2 3.8   Recent Labs  Lab 01/29/22 1659  LIPASE 36   No results for input(s): "AMMONIA" in the last 168 hours. Coagulation Profile: No results for input(s): "INR", "PROTIME" in the last 168 hours. Cardiac Enzymes: No results for input(s): "CKTOTAL", "CKMB", "CKMBINDEX", "  TROPONINI" in the last 168 hours. BNP (last 3 results) No results for input(s): "PROBNP" in the last 8760 hours. HbA1C: No results for input(s): "HGBA1C" in the last 72 hours. CBG: No results for input(s): "GLUCAP" in the last 168 hours. Lipid Profile: No results for input(s): "CHOL", "HDL", "LDLCALC", "TRIG", "CHOLHDL", "LDLDIRECT" in the last 72 hours. Thyroid Function Tests: No results for input(s): "TSH", "T4TOTAL", "FREET4", "T3FREE", "THYROIDAB" in the last 72 hours. Anemia Panel: Recent Labs    01/27/22 1510  FERRITIN 135  TIBC 221*  IRON 86   Urine analysis:    Component Value Date/Time   COLORURINE STRAW (A) 01/29/2022 1850   APPEARANCEUR CLEAR 01/29/2022 1850   LABSPEC 1.009 01/29/2022 1850   PHURINE 5.0 01/29/2022 1850   GLUCOSEU NEGATIVE 01/29/2022 1850   HGBUR SMALL (A) 01/29/2022 1850   BILIRUBINUR NEGATIVE 01/29/2022 1850   KETONESUR NEGATIVE 01/29/2022 1850   PROTEINUR 100 (A) 01/29/2022 1850   UROBILINOGEN 0.2 08/23/2012 2225   NITRITE NEGATIVE 01/29/2022 1850   LEUKOCYTESUR NEGATIVE 01/29/2022 1850   Sepsis Labs: '@LABRCNTIP'$ (procalcitonin:4,lacticidven:4) )No results found for this or any previous visit (from the past 240 hour(s)).   Radiological Exams on Admission: DG Chest 2 View  Result Date: 01/29/2022 CLINICAL  DATA:  Multiple syncopal episodes over the past several weeks. EXAM: CHEST - 2 VIEW COMPARISON:  Chest x-ray dated October 19, 2021. FINDINGS: Unchanged left chest wall pacemaker. The heart size and mediastinal contours are within normal limits. Normal pulmonary vascularity. No focal consolidation, pleural effusion, or pneumothorax. Multiple skin folds again seen overlying the chest. No acute osseous abnormality. IMPRESSION: No active cardiopulmonary disease. Electronically Signed   By: Titus Dubin M.D.   On: 01/29/2022 17:32   EEG adult  Result Date: 01/28/2022 Cameron Sprang, MD     01/28/2022 11:08 AM ELECTROENCEPHALOGRAM REPORT Date of Study: 01/28/2022 Patient's Name: Cristian Davitt Vanderschaaf MRN: [id number] Date of Birth: 10/19/1940 Referring Provider: Dr. Ellouise Newer Clinical History: This is an 81 year old man with recurrent dizziness, staring episodes, and syncope. EEG for classification. Medications: Aspirin, Questran, Lomotil, Marinol, Florinef, Proamantine, Remeron, Thiamine Technical Summary: A multichannel digital EEG recording measured by the international 10-20 system with electrodes applied with paste and impedances below 5000 ohms performed in our laboratory with EKG monitoring in an awake and asleep patient.  Hyperventilation was not performed. Photic stimulation was performed.  The digital EEG was referentially recorded, reformatted, and digitally filtered in a variety of bipolar and referential montages for optimal display.  Description: The patient is awake and asleep during the recording.  During maximal wakefulness, there is a symmetric, medium voltage 8 Hz posterior dominant rhythm that attenuates with eye opening.  The record is symmetric.  During drowsiness and sleep, there is an increase in theta slowing of the background with vertex waves seen. Photic stimulation did not elicit any abnormalities.  There were no epileptiform discharges or electrographic seizures seen.  EKG lead was unremarkable.  Impression: This awake and asleep EEG is normal.  Clinical Correlation: A normal EEG does not exclude a clinical diagnosis of epilepsy.  If further clinical questions remain, prolonged EEG may be helpful.  Clinical correlation is advised. Ellouise Newer, M.D.    EKG: Independently reviewed.  Normal sinus rhythm.  QTc of 421 ms.  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  In addition patient creatinine also has worsened.  Will give fluid bolus continue with fludrocortisone and midodrine.  Physical therapy.  May discuss with cardiologist in the morning. Acute on chronic kidney disease stage III creatinine has worsened from baseline of around 2.6 early part of last month it is around 3.6 now.  Has chronic diarrhea likely contributing.  Patient also has a renal mass which is being worked up by urologist.  Jearld Shines with nephrologist.  Patient is receiving fluid bolus.  Recheck metabolic panel in a.m. Malignant carcinoid of the intestine being followed by Dr. Irene Limbo oncologist.  Also has associated diarrhea for which patient takes Imodium. Recently diagnosed right upper back abscess which was drained by the patient's primary care physician.  Still has mild discharge.  We will get wound team. History of tachybradycardia syndrome status post pacemaker placement. History of A-fib not on anticoagulation secondary to history of GI bleed. Chronic anemia likely from renal disease follow CBC.  Hemoglobin appears to be at baseline. Prior history of PE.   DVT prophylaxis: Heparin. Code Status: Full code. Family Communication: Discussed with patient. Disposition Plan: Home. Consults  called: Physical therapy.  Wound team. Admission status: Observation.   Rise Patience MD [facility] Pager [phone].  If 7PM-7AM, please contact night-coverage [email or web address] Password [email or web address]  01/29/2022, 10:08 PM

## 2022-01-29 NOTE — ED Notes (Signed)
Pt off bedside. Pt c/o dizziness upon assisting him off the commode.

## 2022-01-29 NOTE — ED Notes (Signed)
Admit provider at bedside 

## 2022-01-29 NOTE — ED Notes (Signed)
Integrator for dual pacemaker at bedside

## 2022-01-29 NOTE — ED Provider Notes (Signed)
[facility] EMERGENCY DEPARTMENT Provider Note   CSN: [id number] Arrival date & time: 01/29/22  1604     History  Chief Complaint  Patient presents with   Loss of Consciousness    Alan [name] is a 81 y.o. male.  Presenting to ER due to concern for syncope.  Patient reports that he normally has a few episodes of passing out each month but this normally does not occur on a daily basis.  Over the past week or so however he has been dealing with episodes of passing out on a daily basis.  Almost exclusively occur when he goes from sitting to standing position and standing for prolonged amount of time.  During most recent episode today he felt lightheaded while standing and then passed out.  He denies hitting his head.  He denies any associated chest pain or difficulty in breathing.  He currently has no symptoms.  He states that he takes midodrine and steroid for his syncope.  Reviewed last cardiology note on 11/04/2021 - hx of malignant carcinoid tumor of intestine, pulmonary embolism, paroxysmal atrial fibrillation (not anticoagulated secondary to GI bleed), short gut syndrome, hypertension with orthostatic hypotension, chronic kidney disease stage IIIb and tachybradycardia syndrome s/p dual-chamber pacemaker October 2022  HPI     Home Medications Prior to Admission medications   Medication Sig Start Date End Date Taking? Authorizing Provider  albuterol (VENTOLIN HFA) 108 (90 Base) MCG/ACT inhaler Inhale 2 puffs into the lungs every 6 (six) hours as needed for wheezing or shortness of breath. 01/28/22   [name], MD  aspirin 81 MG EC tablet Take 81 mg by mouth daily. Swallow whole.    [provider]  cholestyramine (QUESTRAN) 4 g packet MIX AND DRINK 1 PACKET(4 GRAMS) BY MOUTH TWICE DAILY WITH A MEAL 11/26/21   Brunetta Genera, MD  diphenoxylate-atropine (LOMOTIL) 2.5-0.025 MG tablet Take 1 tablet by mouth 2 (two) times daily as needed for diarrhea or  loose stools. 11/26/21   Brunetta Genera, MD  dronabinol (MARINOL) 2.5 MG capsule Take 1 capsule (2.5 mg total) by mouth 2 (two) times daily before a meal. 10/23/21   Barb Merino, MD  feeding supplement, ENSURE COMPLETE, (ENSURE COMPLETE) LIQD Take 237 mLs by mouth 2 (two) times daily between meals.    [provider]  fludrocortisone (FLORINEF) 0.1 MG tablet Take 1 tablet (0.1 mg total) by mouth daily. 05/15/21   Jerline Pain, MD  iron polysaccharides (NIFEREX) 150 MG capsule TAKE 1 CAPSULE(150 MG) BY MOUTH 3 TIMES A WEEK 09/22/21   Brunetta Genera, MD  LANREOTIDE ACETATE [place] Inject 1 Dose into the skin every 30 (thirty) days.    [provider]  loperamide (IMODIUM A-D) 2 MG capsule Take by mouth as needed for diarrhea or loose stools.    [provider]  midodrine (PROAMATINE) 5 MG tablet Take 3 tablets (15 mg total) by mouth 3 (three) times daily with meals. 11/10/21   Biagio Borg, MD  mirtazapine (REMERON) 30 MG tablet Take 1 tablet (30 mg total) by mouth daily. 01/12/22 01/07/23  Reed, Tiffany L, DO  Multiple Vitamins-Minerals (PRESERVISION AREDS 2 PO) Take 1 capsule by mouth daily.    [provider]  multivitamin (ONE-A-DAY MEN'S) TABS tablet Take 1 tablet by mouth daily.    [provider]  Potassium Chloride ER 20 MEQ TBCR Take 1 tablet by mouth 2 (two) times daily. 11/10/21   Biagio Borg, MD  SYMBICORT 160-4.5 MCG/ACT inhaler Inhale 2 puffs into the lungs daily as needed (wheezing). 01/28/22   [name], MD  thiamine (VITAMIN B-1) 50 MG tablet Take 1 tablet (50 mg total) by mouth daily. 12/10/21   [name], MD      Allergies    Penicillins and Lisinopril    Review of Systems   Review of Systems  Constitutional:  Negative for chills and fever.  HENT:  Negative for ear pain and sore throat.   Eyes:  Negative for pain and visual disturbance.  Respiratory:  Negative for cough and shortness of breath.   Cardiovascular:   Negative for chest pain and palpitations.  Gastrointestinal:  Negative for abdominal pain and vomiting.  Genitourinary:  Negative for dysuria and hematuria.  Musculoskeletal:  Negative for arthralgias and back pain.  Skin:  Negative for color change and rash.  Neurological:  Positive for syncope. Negative for seizures.  All other systems reviewed and are negative.   Physical Exam Updated Vital Signs BP (!) 199/108   Pulse 68   Temp 97.9 F (36.6 C) (Oral)   Resp (!) 21   Ht '6\' 1"'$  (1.854 m)   Wt 60.5 kg   SpO2 99%   BMI 17.60 kg/m  Physical Exam Vitals and nursing note reviewed.  Constitutional:      General: He is not in acute distress.    Appearance: He is well-developed.  HENT:     Head: Normocephalic and atraumatic.  Eyes:     Conjunctiva/sclera: Conjunctivae normal.  Cardiovascular:     Rate and Rhythm: Normal rate and regular rhythm.     Heart sounds: No murmur heard. Pulmonary:     Effort: Pulmonary effort is normal. No respiratory distress.     Breath sounds: Normal breath sounds.  Abdominal:     Palpations: Abdomen is soft.     Tenderness: There is no abdominal tenderness.  Musculoskeletal:        General: No swelling.     Cervical back: Neck supple.  Skin:    General: Skin is warm and dry.     Capillary Refill: Capillary refill takes less than 2 seconds.  Neurological:     Mental Status: He is alert.  Psychiatric:        Mood and Affect: Mood normal.     ED Results / Procedures / Treatments   Labs (all labs ordered are listed, but only abnormal results are displayed) Labs Reviewed  CBC WITH DIFFERENTIAL/PLATELET - Abnormal; Notable for the following components:      Result Value   RBC 3.09 (*)    Hemoglobin 9.5 (*)    HCT 27.1 (*)    All other components within normal limits  COMPREHENSIVE METABOLIC PANEL - Abnormal; Notable for the following components:   CO2 20 (*)    Glucose, Bld 128 (*)    BUN 50 (*)    Creatinine, Ser 3.83 (*)    GFR,  Estimated 15 (*)    All other components within normal limits  URINALYSIS, ROUTINE W REFLEX MICROSCOPIC - Abnormal; Notable for the following components:   Color, Urine STRAW (*)    Hgb urine dipstick SMALL (*)    Protein, ur 100 (*)    All other components within normal limits  CBC - Abnormal; Notable for the following components:   RBC 2.94 (*)    Hemoglobin 9.2 (*)    HCT 25.6 (*)    All other components within normal limits  TROPONIN I (HIGH SENSITIVITY) - Abnormal; Notable for the following components:   Troponin I (High Sensitivity) 25 (*)    All other components within normal limits  TROPONIN I (HIGH SENSITIVITY) - Abnormal; Notable for the following components:   Troponin I (High Sensitivity) 30 (*)    All other components within normal limits  LIPASE, BLOOD  CREATININE, SERUM  BASIC METABOLIC PANEL  CBC    EKG EKG Interpretation  Date/Time:  Thursday January 29 2022 16:09:21 EDT Ventricular Rate:  68 PR Interval:  157 QRS Duration: 96 QT Interval:  395 QTC Calculation: 421 R Axis:   87 Text Interpretation: Sinus rhythm Borderline right axis deviation Anteroseptal infarct, old Nonspecific repol abnormality, diffuse leads Confirmed by Madalyn Rob [phone]) on 01/29/2022 8:44:48 PM  Radiology DG Chest 2 View  Result Date: 01/29/2022 CLINICAL DATA:  Multiple syncopal episodes over the past several weeks. EXAM: CHEST - 2 VIEW COMPARISON:  Chest x-ray dated October 19, 2021. FINDINGS: Unchanged left chest wall pacemaker. The heart size and mediastinal contours are within normal limits. Normal pulmonary vascularity. No focal consolidation, pleural effusion, or pneumothorax. Multiple skin folds again seen overlying the chest. No acute osseous abnormality. IMPRESSION: No active cardiopulmonary disease. Electronically Signed   By: Titus Dubin M.D.   On: 01/29/2022 17:32   EEG adult  Result Date: 01/28/2022 Cameron Sprang, MD     01/28/2022 11:08 AM ELECTROENCEPHALOGRAM REPORT  Date of Study: 01/28/2022 Patient's Name: Alan [name] MRN: [id number] Date of Birth: Feb 22, 1941 Referring Provider: Dr. Ellouise Newer Clinical History: This is an 81 year old man with recurrent dizziness, staring episodes, and syncope. EEG for classification. Medications: Aspirin, Questran, Lomotil, Marinol, Florinef, Proamantine, Remeron, Thiamine Technical Summary: A multichannel digital EEG recording measured by the international 10-20 system with electrodes applied with paste and impedances below 5000 ohms performed in our laboratory with EKG monitoring in an awake and asleep patient.  Hyperventilation was not performed. Photic stimulation was performed.  The digital EEG was referentially recorded, reformatted, and digitally filtered in a variety of bipolar and referential montages for optimal display.  Description: The patient is awake and asleep during the recording.  During maximal wakefulness, there is a symmetric, medium voltage 8 Hz posterior dominant rhythm that attenuates with eye opening.  The record is symmetric.  During drowsiness and sleep, there is an increase in theta slowing of the background with vertex waves seen. Photic stimulation did not elicit any abnormalities.  There were no epileptiform discharges or electrographic seizures seen.  EKG lead was unremarkable. Impression: This awake and asleep EEG is normal.  Clinical Correlation: A normal EEG does not exclude a clinical diagnosis of epilepsy.  If further clinical questions remain, prolonged EEG may be helpful.  Clinical correlation is advised. Ellouise Newer, M.D.    Procedures Procedures    Medications Ordered in ED Medications  midodrine (PROAMATINE) tablet 15 mg (has no administration in time range)  mirtazapine (REMERON) tablet 30 mg (has no administration in time range)  fludrocortisone (FLORINEF) tablet 0.1 mg (has no administration in time range)  dronabinol (MARINOL) capsule 2.5 mg (has no administration in time range)   heparin injection 5,000 Units (5,000 Units Subcutaneous Given 01/29/22 2238)  sodium chloride 0.9 % bolus 500 mL (0 mLs Intravenous Stopped 01/29/22 2226)    ED Course/ Medical Decision Making/ A&P  Medical Decision Making Amount and/or Complexity of Data Reviewed Labs: ordered. Radiology: ordered. ECG/medicine tests: ordered.  Risk Decision regarding hospitalization.   81 year old male presenting for syncopal episodes.  Has long history of vasovagal syncope.  Has been evaluated by cardiology.  Has been prescribed midodrine and fludrocortisone.  Patient reports significant increase in the frequency of these episodes.  At rest he appears well and is in no distress and has no ongoing symptoms.  His EKG shows a sinus rhythm.  Patient does have pacemaker.  We interrogated this and he has not had any acute events per this report.  Patient does not have any obvious electrolyte derangements.  No significant anemia.  His creatinine is slightly worse than a few days ago which was significantly worse than his creatinine a month ago.  Suspect acute on chronic kidney disease.  Provided some IVF.  Attempted to check orthostatics however patient became profoundly symptomatic when he attempted to stand up, very lightheaded, near syncopal.  Blood pressure did drop significantly.  Given how symptomatic patient is I believe he would benefit from admission.  I discussed the case with Dr. Raliegh Ip who will evaluate and admit patient.  Updated patient's wife at bedside throughout visit.        Final Clinical Impression(s) / ED Diagnoses Final diagnoses:  Syncope and collapse  Syncope, unspecified syncope type  Orthostatic hypotension    Rx / DC Orders ED Discharge Orders     None         Lucrezia Starch, MD 01/29/22 2249

## 2022-01-29 NOTE — ED Notes (Signed)
[employer] rep called to stated he will be here to interrogate pacemaker in approx 20 mins.

## 2022-01-29 NOTE — ED Notes (Signed)
CT tech informed me that patient was not able to do CT scan because patient need to use the restroom. Pt was brought back to room.

## 2022-01-29 NOTE — ED Notes (Signed)
MD Hal Hope would like to be notified of BP in one hour to see if it continues to be hypertensive. At this time no interventions needed d/t pt being asymptomatic per MD.

## 2022-01-29 NOTE — ED Notes (Signed)
Pt to bedside commode

## 2022-01-30 ENCOUNTER — Ambulatory Visit: Payer: Medicare Other | Admitting: Internal Medicine

## 2022-01-30 DIAGNOSIS — R55 Syncope and collapse: Secondary | ICD-10-CM | POA: Diagnosis not present

## 2022-01-30 DIAGNOSIS — K912 Postsurgical malabsorption, not elsewhere classified: Secondary | ICD-10-CM | POA: Diagnosis not present

## 2022-01-30 DIAGNOSIS — I129 Hypertensive chronic kidney disease with stage 1 through stage 4 chronic kidney disease, or unspecified chronic kidney disease: Secondary | ICD-10-CM | POA: Diagnosis not present

## 2022-01-30 DIAGNOSIS — N1832 Chronic kidney disease, stage 3b: Secondary | ICD-10-CM | POA: Diagnosis not present

## 2022-01-30 DIAGNOSIS — D649 Anemia, unspecified: Secondary | ICD-10-CM | POA: Diagnosis not present

## 2022-01-30 DIAGNOSIS — N179 Acute kidney failure, unspecified: Secondary | ICD-10-CM | POA: Diagnosis not present

## 2022-01-30 LAB — CBC
HCT: 26.2 % — ABNORMAL LOW (ref 39.0–52.0)
Hemoglobin: 9.3 g/dL — ABNORMAL LOW (ref 13.0–17.0)
MCH: 30.9 pg (ref 26.0–34.0)
MCHC: 35.5 g/dL (ref 30.0–36.0)
MCV: 87 fL (ref 80.0–100.0)
Platelets: 163 10*3/uL (ref 150–400)
RBC: 3.01 MIL/uL — ABNORMAL LOW (ref 4.22–5.81)
RDW: 13.4 % (ref 11.5–15.5)
WBC: 7.2 10*3/uL (ref 4.0–10.5)
nRBC: 0 % (ref 0.0–0.2)

## 2022-01-30 LAB — SODIUM, URINE, RANDOM: Sodium, Ur: 89 mmol/L

## 2022-01-30 LAB — BASIC METABOLIC PANEL
Anion gap: 9 (ref 5–15)
BUN: 47 mg/dL — ABNORMAL HIGH (ref 8–23)
CO2: 21 mmol/L — ABNORMAL LOW (ref 22–32)
Calcium: 8.9 mg/dL (ref 8.9–10.3)
Chloride: 111 mmol/L (ref 98–111)
Creatinine, Ser: 3.37 mg/dL — ABNORMAL HIGH (ref 0.61–1.24)
GFR, Estimated: 18 mL/min — ABNORMAL LOW (ref >60)
Glucose, Bld: 104 mg/dL — ABNORMAL HIGH (ref 70–99)
Potassium: 4.3 mmol/L (ref 3.5–5.1)
Sodium: 141 mmol/L (ref 135–145)

## 2022-01-30 MED ORDER — SODIUM CHLORIDE 0.9 % IV SOLN: INTRAVENOUS | Status: DC

## 2022-01-30 MED ORDER — MOMETASONE FURO-FORMOTEROL FUM 200-5 MCG/ACT IN AERO
2.0000 | INHALATION_SPRAY | Freq: Two times a day (BID) | RESPIRATORY_TRACT | Status: DC
  Administered 2022-01-30 – 2022-02-01 (×5): 2 via RESPIRATORY_TRACT
  Filled 2022-01-30: qty 8.8

## 2022-01-30 MED ORDER — ALBUTEROL SULFATE (2.5 MG/3ML) 0.083% IN NEBU
2.5000 mg | INHALATION_SOLUTION | Freq: Four times a day (QID) | RESPIRATORY_TRACT | Status: DC | PRN
  Filled 2022-01-30: qty 3

## 2022-01-30 MED ORDER — AMLODIPINE BESYLATE 10 MG PO TABS
10.0000 mg | ORAL_TABLET | Freq: Every day | ORAL | Status: DC
  Administered 2022-01-30 – 2022-01-31 (×2): 10 mg via ORAL
  Filled 2022-01-30 (×2): qty 1

## 2022-01-30 MED ORDER — MIRTAZAPINE 15 MG PO TABS
30.0000 mg | ORAL_TABLET | Freq: Every day | ORAL | Status: DC
Start: 2022-01-30 — End: 2022-02-01
  Administered 2022-01-30 – 2022-01-31 (×3): 30 mg via ORAL
  Filled 2022-01-30: qty 1
  Filled 2022-01-30 (×3): qty 2

## 2022-01-30 MED ORDER — LABETALOL HCL 5 MG/ML IV SOLN
20.0000 mg | Freq: Once | INTRAVENOUS | Status: AC
  Administered 2022-01-30: 20 mg via INTRAVENOUS
  Filled 2022-01-30: qty 4

## 2022-01-30 MED ORDER — POLYSACCHARIDE IRON COMPLEX 150 MG PO CAPS
150.0000 mg | ORAL_CAPSULE | ORAL | Status: DC
  Administered 2022-01-30: 150 mg via ORAL
  Filled 2022-01-30: qty 1

## 2022-01-30 MED ORDER — SODIUM CHLORIDE 0.9 % IV BOLUS
500.0000 mL | Freq: Once | INTRAVENOUS | Status: AC
  Administered 2022-01-30: 500 mL via INTRAVENOUS

## 2022-01-30 MED ORDER — LABETALOL HCL 5 MG/ML IV SOLN
10.0000 mg | INTRAVENOUS | Status: DC | PRN
Start: 2022-01-30 — End: 2022-02-01
  Administered 2022-01-30: 10 mg via INTRAVENOUS
  Filled 2022-01-30: qty 4

## 2022-01-30 MED ORDER — LOPERAMIDE HCL 2 MG PO CAPS: 2.0000 mg | ORAL_CAPSULE | ORAL | Status: DC | PRN

## 2022-01-30 MED ORDER — ASPIRIN 81 MG PO TBEC
81.0000 mg | DELAYED_RELEASE_TABLET | Freq: Every day | ORAL | Status: DC
  Administered 2022-01-30 – 2022-02-01 (×3): 81 mg via ORAL
  Filled 2022-01-30 (×3): qty 1

## 2022-01-30 MED ORDER — HYDRALAZINE HCL 25 MG PO TABS
25.0000 mg | ORAL_TABLET | Freq: Three times a day (TID) | ORAL | Status: DC
  Administered 2022-01-30 – 2022-01-31 (×3): 25 mg via ORAL
  Filled 2022-01-30 (×3): qty 1

## 2022-01-30 NOTE — Progress Notes (Signed)
PT Cancellation Note  Patient Details Name: Alan [name] MRN: [id number] DOB: 29-Oct-1940   Cancelled Treatment:    Reason Eval/Treat Not Completed: Other (comment).  Off the floor, retry as time and pt allow.   Ramond Dial 01/30/2022, 9:58 AM  Mee Hives, PT PhD Acute Rehab Dept. Number: [place] [facility] [place]

## 2022-01-30 NOTE — Progress Notes (Signed)
[facility]) Hospital Liaison note:  This patient is currently enrolled in [facility] outpatient-based Palliative Care. Will continue to follow for disposition.  Please call with any outpatient palliative questions or concerns.  Thank you, Lorelee Market, LPN [facility] Liaison [phone]

## 2022-01-30 NOTE — Progress Notes (Signed)
   01/30/22 1021  Assess: MEWS Score  Temp 97.6 F (36.4 C)  BP (!) 218/120 (MD notified)  MAP (mmHg) 149  Pulse Rate 79  Resp 16  SpO2 96 %  Assess: MEWS Score  MEWS Temp 0  MEWS Systolic 2  MEWS Pulse 0  MEWS RR 0  MEWS LOC 0  MEWS Score 2  MEWS Score Color Yellow  Assess: if the MEWS score is Yellow or Red  Were vital signs taken at a resting state? Yes  Focused Assessment Change from prior assessment (see assessment flowsheet)  Does the patient meet 2 or more of the SIRS criteria? No  MEWS guidelines implemented *See Row Information* Yes  Treat  Pain Score 0  Take Vital Signs  Increase Vital Sign Frequency  Yellow: Q 2hr X 2 then Q 4hr X 2, if remains yellow, continue Q 4hrs  Escalate  MEWS: Escalate Yellow: discuss with charge nurse/RN and consider discussing with provider and RRT  Notify: Charge Nurse/RN  Name of Charge Nurse/RN Notified Candice, RN  Date Charge Nurse/RN Notified 01/30/22  Time Charge Nurse/RN Notified 49  Notify: Provider  Provider Name/Title Adhikari, MD  Date Provider Notified 01/30/22  Time Provider Notified 1033  Method of Notification Page  Notification Reason Other (Comment) (BP continuing to rise)  Provider response See new orders  Date of Provider Response 01/30/22  Time of Provider Response 1034  Document  Patient Outcome Stabilized after interventions  Progress note created (see row info) Yes  Assess: SIRS CRITERIA  SIRS Temperature  0  SIRS Pulse 0  SIRS Respirations  0  SIRS WBC 1  SIRS Score Sum  1

## 2022-01-30 NOTE — Evaluation (Signed)
Occupational Therapy Evaluation Patient Details Name: Alan [name] MRN: [id number] DOB: 1941-06-01 Today's Date: 01/30/2022   History of Present Illness 81 y.o. M admitted on 01/29/22 due to frequent syncopal episodes. Pt found to be hypertensive with acute kidney injury, as well as hypertensive. 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   Clinical Impression   Pt admitted for concerns listed above. PTA Pt reported that he was independent with all ADL's and functional mobility. Recently due to syncopal episodes, pt's wife has been providing increased assist/supervision for safety, especially during showers. At this time, pt appears at his baseline, able to complete BADL's and functional mobility at a supervision level. No dizziness or syncopal concerns this session, Pt continues to be hypertensive. He has no further skilled OT needs and acute OT will sign off.       Recommendations for follow up therapy are one component of a multi-disciplinary discharge planning process, led by the attending physician.  Recommendations may be updated based on patient status, additional functional criteria and insurance authorization.   Follow Up Recommendations  No OT follow up    Assistance Recommended at Discharge Set up Supervision/Assistance  Patient can return home with the following A little help with walking and/or transfers;Assistance with cooking/housework    Functional Status Assessment  Patient has had a recent decline in their functional status and demonstrates the ability to make significant improvements in function in a reasonable and predictable amount of time.  Equipment Recommendations  None recommended by OT     Recommendations for Other Services       Precautions / Restrictions Precautions Precautions: Fall Restrictions Weight Bearing Restrictions: No      Mobility Bed Mobility Overal bed mobility: Independent                  Transfers Overall transfer level: Modified independent Equipment used: None                      Balance Overall balance assessment: Mild deficits observed, not formally tested                                         ADL either performed or assessed with clinical judgement   ADL Overall ADL's : At baseline;Modified independent                                       General ADL Comments: Pt able to complete with increased time and supervision or assist as needed by his wife due to syncopal episodes     Vision Baseline Vision/History: 1 Wears glasses Ability to See in Adequate Light: 0 Adequate Patient Visual Report: No change from baseline Vision Assessment?: No apparent visual deficits     Perception     Praxis      Pertinent Vitals/Pain Pain Assessment Pain Assessment: No/denies pain     Hand Dominance Right   Extremity/Trunk Assessment Upper Extremity Assessment Upper Extremity Assessment: Overall WFL for tasks assessed   Lower Extremity Assessment Lower Extremity Assessment: Generalized weakness   Cervical /  Trunk Assessment Cervical / Trunk Assessment: Normal   Communication Communication Communication: No difficulties   Cognition Arousal/Alertness: Awake/alert Behavior During Therapy: WFL for tasks assessed/performed Overall Cognitive Status: Within Functional Limits for tasks assessed                                       General Comments  VSS on RA    Exercises     Shoulder Instructions      Home Living Family/patient expects to be discharged to:: Private residence Living Arrangements: Spouse/significant other Available Help at Discharge:  Family;Available 24 hours/day Type of Home: House Home Access: Stairs to enter [employer] of Steps: 5 Entrance Stairs-Rails: Can reach both Home Layout: One level     Bathroom Shower/Tub: Occupational psychologist: Handicapped height Bathroom Accessibility: Yes How Accessible: Accessible via walker Home Equipment: Shower seat - built in;Cane - single point;Wheelchair Probation officer (4 wheels);Rolling Walker (2 wheels);BSC/3in1;Grab bars - tub/shower          Prior Functioning/Environment Prior Level of Function : Needs assist             Mobility Comments: uses cane intermittently for balance ADLs Comments: sits to shower with wife supervising, takes cool shower to prevent vasovagal episodes        OT Problem List: Decreased strength;Decreased activity tolerance;Impaired balance (sitting and/or standing);Cardiopulmonary status limiting activity      OT Treatment/Interventions:      OT Goals(Current goals can be found in the care plan section) Acute Rehab OT Goals Patient Stated Goal: To go home OT Goal Formulation: With patient Time For Goal Achievement: 01/30/22 Potential to Achieve Goals: Good  OT Frequency:      Co-evaluation              AM-PAC OT "6 Clicks" Daily Activity     Outcome Measure Help from another person eating meals?: None Help from another person taking care of personal grooming?: None Help from another person toileting, which includes using toliet, bedpan, or urinal?: None Help from another person bathing (including washing, rinsing, drying)?: None Help from another person to put on and taking off regular upper body clothing?: None Help from another person to put on and taking off regular lower body clothing?: None 6 Click Score: 24   End of Session Equipment Utilized During Treatment: Gait belt;Rolling walker (2 wheels) Nurse Communication: Mobility status  Activity Tolerance: Patient tolerated treatment  well Patient left: in bed;with call bell/phone within reach  OT Visit Diagnosis: Unsteadiness on feet (R26.81);Other abnormalities of gait and mobility (R26.89);Muscle weakness (generalized) (M62.81)                Time: 6803-2122 OT Time Calculation (min): 13 min Charges:  OT General Charges $OT Visit: 1 Visit OT Evaluation $OT Eval Moderate Complexity: 1 Mod  Zaleigh Bermingham H., OTR/L Acute Rehabilitation  Tzipporah Nagorski Elane Yolanda Bonine 01/30/2022, 4:33 PM

## 2022-01-30 NOTE — Consult Note (Addendum)
[facility] Nurse Consult Note: Consult requested for posterior shoulder wound. Pt was seen by a phisician 2 days ago for an abscess to this site and had an I&D performed, according to progress notes.   Wound type: Right posterior scapula wound with full thickness wound; 2.5X2.5cm area of red raised skin surrounding the open wound which is .5X.5X.8cm, 80% red, 20% yellow.  Mod amt dark red drainage when previous packing strip was removed.Repacked the wound and Pt tolerated with minimal amt discomfort. Dressing procedure/placement/frequency: Topical treatment orders provided for bedside nurses to perform as follows to absorb drainage and promote healing: Pack right posterior scapula wound Q day using gauze packing strip (left at the bedside) and swab to fill, then cover with foam dressing. (Change foam dressing Q 3 days or PRN soiling.) Please re-consult if further assistance is needed.  Thank-you,  Julien Girt MSN, [place], [place], [place], [place]

## 2022-01-30 NOTE — ED Notes (Signed)
Verbal report given Cathe Mons RN at this time

## 2022-01-30 NOTE — Consult Note (Signed)
Reason for Consult: Renal failure Referring Physician:  Dr. 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 He has CKD for about 10 years with creatinine level around 2.6 10/2021.   Acute kidney injury on CKDIIIb with a baseline creatinine of ~2.6: acute component of renal failure secondary to decreased renal perfusion + ongoing losses through the GI track. Increased frequency of syncopal episodes may be a function of increased and continued GI losses + admitted poor oral intake. -  No need to repeat kidney ultrasound as unlikely to be obstructive. - Encouraged increased oral intake + hydration; easier said than done with a poor appetite despite Mirtazapine and Marinol.  - Renal function already improving and can f/u with [facility] regular appt.   Orthostatic hypotension - Currently on midodrine and Florinef. Should f/u cards upon d/c. 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 Following with oncologist Dr. Irene Limbo on lanreonide. Hypertension - Blood pressure control suboptimal but he has severe orthostatic hypotension. He is actually on midodrine and Florinef. Probably should follow up  with cardiologist. Hasn't seen cards in a while. Malnutrition - Currently on appetite stimulants. Encourage oral intake including protein. He has significant muscle loss.   HPI: Alan [name] is an 81 y.o. 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 Irene Limbo, anemia, severe malnutrition, CKD III with a baseline creatinine of ~2.6 presenting with syncopal episode. He has a history of orthostatic hypotension but it seems  to have increased in frequency. He had a syncopal episode this time when he tried standing. In the ED he had a drop in pressures. Creatinine also increased to 3.6 but at time of consultation the Cr had already decreased to 3.37. He denies headache, dizziness, nausea, vomiting, chest pain or shortness of breath.  No urinary complaint.  No use of NSAIDs.  ROS Pertinent items are noted in HPI.  Chemistry and CBC: Creatinine  Date/Time Value Ref Range Status  01/27/2022 03:10 PM 3.68 (HH) 0.61 - 1.24 mg/dL Final    Comment:    REPEATED TO VERIFY CRITICAL RESULT CALLED TO, READ BACK BY AND VERIFIED WITH: Yetta Glassman at 1555 on 1Aug23 by [facility]    12/29/2021 03:07 PM 2.68 (H) 0.61 - 1.24 mg/dL Final  12/01/2021 02:59 PM 2.49 (H) 0.61 - 1.24 mg/dL Final  11/25/2021 12:13 PM 2.54 (H) 0.61 - 1.24 mg/dL Final  11/03/2021 02:51 PM 2.57 (H) 0.61 - 1.24 mg/dL Final  10/13/2021 09:37 AM 2.45 (H) 0.61 - 1.24 mg/dL Final  09/08/2021 02:55 PM 2.09 (H) 0.61 - 1.24 mg/dL Final  08/15/2021 09:37 AM 1.98 (H) 0.61 - 1.24 mg/dL Final  07/15/2021 12:26 PM 1.76 (H) 0.61 - 1.24 mg/dL Final  06/26/2021 11:06 AM 1.91 (H) 0.61 - 1.24 mg/dL Final  06/16/2021 08:59 AM 1.89 (H) 0.61 - 1.24 mg/dL Final  06/12/2021 07:44 AM 1.86 (H) 0.61 - 1.24 mg/dL Final  06/11/2021 02:04 PM 2.01 (H) 0.61 - 1.24 mg/dL Final   Creatinine, Ser  Date/Time Value Ref Range Status  01/30/2022 02:56 AM 3.37 (H) 0.61 - 1.24 mg/dL Final  01/29/2022 10:23 PM 3.52 (H) 0.61 - 1.24 mg/dL Final  01/29/2022 04:59 PM 3.83 (H) 0.61 - 1.24 mg/dL Final  10/22/2021 03:23  AM 2.18 (H) 0.61 - 1.24 mg/dL Final  10/21/2021 02:06 AM 2.05 (H) 0.61 - 1.24 mg/dL Final  10/20/2021 04:15 AM 2.31 (H) 0.61 - 1.24 mg/dL Final  10/19/2021 09:38 PM 2.87 (H) 0.61 - 1.24 mg/dL Final  06/09/2021 03:44 PM 1.98 (H) 0.40 - 1.50 mg/dL Final  04/24/2021 12:56 AM 1.39 (H) 0.61 - 1.24 mg/dL Final  04/23/2021 12:51 PM 1.41 (H) 0.61 - 1.24 mg/dL Final  04/22/2021 04:49 PM  1.74 (H) 0.61 - 1.24 mg/dL Final  01/22/2021 09:22 AM 1.29 (H) 0.61 - 1.24 mg/dL Final  01/19/2021 05:00 AM 1.17 0.61 - 1.24 mg/dL Final  01/18/2021 04:16 PM 1.22 0.61 - 1.24 mg/dL Final  01/17/2021 03:36 AM 1.29 (H) 0.61 - 1.24 mg/dL Final  01/16/2021 09:59 AM 1.56 (H) 0.61 - 1.24 mg/dL Final  08/23/2012 03:51 PM 2.00 (H) 0.50 - 1.35 mg/dL Final  01/23/2011 08:10 AM 1.28 0.50 - 1.35 mg/dL Final  01/16/2011 09:54 AM 1.04 0.50 - 1.35 mg/dL Final    Comment:    DEB SHEALY AT 1035 ON 07.20.2012 NBROOKS  10/15/2010 01:22 PM 1.03 0.40 - 1.50 mg/dL Final  10/03/2010 08:24 AM 1.05 0.40 - 1.50 mg/dL Final  07/11/2010 10:00 AM 1.21 0.40 - 1.50 mg/dL Final  07/04/2010 09:12 AM 1.19 0.40 - 1.50 mg/dL Final  04/10/2010 11:31 AM 1.21 0.40 - 1.50 mg/dL Final  04/03/2010 06:21 AM 1.11 0.4 - 1.5 mg/dL Final  04/01/2010 05:44 AM 1.17 0.4 - 1.5 mg/dL Final  03/28/2010 01:46 PM 1.30 0.4 - 1.5 mg/dL Final  08/21/2007 10:58 AM 1.29  Final   Recent Labs  Lab 01/27/22 1510 01/29/22 1659 01/29/22 2223 01/30/22 0256  NA 141 138  --  141  K 4.4 4.1  --  4.3  CL 108 106  --  111  CO2 25 20*  --  21*  GLUCOSE 112* 128*  --  104*  BUN 49* 50*  --  47*  CREATININE 3.68* 3.83* 3.52* 3.37*  CALCIUM 9.3 9.2  --  8.9   Recent Labs  Lab 01/27/22 1510 01/29/22 1659 01/29/22 2223 01/30/22 0256  WBC 7.0 7.4 6.4 7.2  NEUTROABS 4.7 5.9  --   --   HGB 10.0* 9.5* 9.2* 9.3*  HCT 28.6* 27.1* 25.6* 26.2*  MCV 87.5 87.7 87.1 87.0  PLT 196 185 160 163   Liver Function Tests: Recent Labs  Lab 01/27/22 1510 01/29/22 1659  AST 14* 18  ALT 13 16  ALKPHOS 48 50  BILITOT 0.6 0.7  PROT 7.3 6.6  ALBUMIN 4.2 3.8   Recent Labs  Lab 01/29/22 1659  LIPASE 36   No results for input(s): "AMMONIA" in the last 168 hours. Cardiac Enzymes: No results for input(s): "CKTOTAL", "CKMB", "CKMBINDEX", "TROPONINI" in the last 168 hours. Iron Studies:  Recent Labs    01/27/22 1510  IRON 86  TIBC 221*  FERRITIN 135    PT/INR: '@LABRCNTIP'$ (inr:5)  Xrays/Other Studies: ) Results for orders placed or performed during the hospital encounter of 01/29/22 (from the past 48 hour(s))  CBC with Differential     Status: Abnormal   Collection Time: 01/29/22  4:59 PM  Result Value Ref Range   WBC 7.4 4.0 - 10.5 K/uL   RBC 3.09 (L) 4.22 - 5.81 MIL/uL   Hemoglobin 9.5 (L) 13.0 - 17.0 g/dL   HCT 27.1 (L) 39.0 - 52.0 %   MCV 87.7 80.0 - 100.0 fL   MCH 30.7 26.0 - 34.0 pg   MCHC 35.1 30.0 -  36.0 g/dL   RDW 13.5 11.5 - 15.5 %   Platelets 185 150 - 400 K/uL   nRBC 0.0 0.0 - 0.2 %   Neutrophils Relative % 81 %   Neutro Abs 5.9 1.7 - 7.7 K/uL   Lymphocytes Relative 13 %   Lymphs Abs 1.0 0.7 - 4.0 K/uL   Monocytes Relative 6 %   Monocytes Absolute 0.5 0.1 - 1.0 K/uL   Eosinophils Relative 0 %   Eosinophils Absolute 0.0 0.0 - 0.5 K/uL   Basophils Relative 0 %   Basophils Absolute 0.0 0.0 - 0.1 K/uL   Immature Granulocytes 0 %   Abs Immature Granulocytes 0.02 0.00 - 0.07 K/uL    Comment: Performed at [facility] [address], [place], [place] [address]  Comprehensive metabolic panel     Status: Abnormal   Collection Time: 01/29/22  4:59 PM  Result Value Ref Range   Sodium 138 135 - 145 mmol/L   Potassium 4.1 3.5 - 5.1 mmol/L   Chloride 106 98 - 111 mmol/L   CO2 20 (L) 22 - 32 mmol/L   Glucose, Bld 128 (H) 70 - 99 mg/dL    Comment: Glucose reference range applies only to samples taken after fasting for at least 8 hours.   BUN 50 (H) 8 - 23 mg/dL   Creatinine, Ser 3.83 (H) 0.61 - 1.24 mg/dL   Calcium 9.2 8.9 - 10.3 mg/dL   Total Protein 6.6 6.5 - 8.1 g/dL   Albumin 3.8 3.5 - 5.0 g/dL   AST 18 15 - 41 U/L   ALT 16 0 - 44 U/L   Alkaline Phosphatase 50 38 - 126 U/L   Total Bilirubin 0.7 0.3 - 1.2 mg/dL   GFR, Estimated 15 (L) >60 mL/min    Comment: (NOTE) Calculated using the CKD-EPI Creatinine Equation (2021)    Anion gap 12 5 - 15    Comment: Performed at [facility] [address] [place], [place] [address]  Troponin I (High Sensitivity)     Status: Abnormal   Collection Time: 01/29/22  4:59 PM  Result Value Ref Range   Troponin I (High Sensitivity) 25 (H) <18 ng/L    Comment: (NOTE) Elevated high sensitivity troponin I (hsTnI) values and significant  changes across serial measurements may suggest ACS but many other  chronic and acute conditions are known to elevate hsTnI results.  Refer to the "Links" section for chest pain algorithms and additional  guidance. Performed at [facility], [place] [address] [place], [place] [address]   Lipase, blood     Status: None   Collection Time: 01/29/22  4:59 PM  Result Value Ref Range   Lipase 36 11 - 51 U/L    Comment: Performed at [facility] [address], [place], [place] [address]  Troponin I (High Sensitivity)     Status: Abnormal   Collection Time: 01/29/22  6:43 PM  Result Value Ref Range   Troponin I (High Sensitivity) 30 (H) <18 ng/L    Comment: (NOTE) Elevated high sensitivity troponin I (hsTnI) values and significant  changes across serial measurements may suggest ACS but many other  chronic and acute conditions are known to elevate hsTnI results.  Refer to the "Links" section for chest pain algorithms and additional  guidance. Performed at [facility], [place] [address] [place], [place] [address]   Urinalysis, Routine w reflex microscopic     Status: Abnormal  Collection Time: 01/29/22  6:50 PM  Result Value Ref Range   Color, Urine STRAW (A) YELLOW   APPearance CLEAR CLEAR   Specific Gravity, Urine 1.009 1.005 - 1.030   pH 5.0 5.0 - 8.0   Glucose, UA NEGATIVE NEGATIVE mg/dL   Hgb urine dipstick SMALL (A) NEGATIVE   Bilirubin Urine NEGATIVE NEGATIVE   Ketones, ur NEGATIVE NEGATIVE mg/dL   Protein, ur 100 (A) NEGATIVE mg/dL   Nitrite NEGATIVE NEGATIVE   Leukocytes,Ua NEGATIVE NEGATIVE   RBC / HPF 0-5 0 - 5 RBC/hpf   WBC, UA 0-5 0 - 5 WBC/hpf   Bacteria, UA NONE SEEN NONE SEEN    Squamous Epithelial / LPF 0-5 0 - 5    Comment: Performed at [facility] [address] [place], [place] [address]  CBC     Status: Abnormal   Collection Time: 01/29/22 10:23 PM  Result Value Ref Range   WBC 6.4 4.0 - 10.5 K/uL   RBC 2.94 (L) 4.22 - 5.81 MIL/uL   Hemoglobin 9.2 (L) 13.0 - 17.0 g/dL   HCT 25.6 (L) 39.0 - 52.0 %   MCV 87.1 80.0 - 100.0 fL   MCH 31.3 26.0 - 34.0 pg   MCHC 35.9 30.0 - 36.0 g/dL   RDW 13.3 11.5 - 15.5 %   Platelets 160 150 - 400 K/uL   nRBC 0.0 0.0 - 0.2 %    Comment: Performed at [facility], [place] [address], [place], [place] [address]  Creatinine, serum     Status: Abnormal   Collection Time: 01/29/22 10:23 PM  Result Value Ref Range   Creatinine, Ser 3.52 (H) 0.61 - 1.24 mg/dL   GFR, Estimated 17 (L) >60 mL/min    Comment: (NOTE) Calculated using the CKD-EPI Creatinine Equation (2021) Performed at [facility] [address] [place], [place] [address]   Basic metabolic panel     Status: Abnormal   Collection Time: 01/30/22  2:56 AM  Result Value Ref Range   Sodium 141 135 - 145 mmol/L   Potassium 4.3 3.5 - 5.1 mmol/L   Chloride 111 98 - 111 mmol/L   CO2 21 (L) 22 - 32 mmol/L   Glucose, Bld 104 (H) 70 - 99 mg/dL    Comment: Glucose reference range applies only to samples taken after fasting for at least 8 hours.   BUN 47 (H) 8 - 23 mg/dL   Creatinine, Ser 3.37 (H) 0.61 - 1.24 mg/dL   Calcium 8.9 8.9 - 10.3 mg/dL   GFR, Estimated 18 (L) >60 mL/min    Comment: (NOTE) Calculated using the CKD-EPI Creatinine Equation (2021)    Anion gap 9 5 - 15    Comment: Performed at [facility] [address] [place], [place] [address]  CBC     Status: Abnormal   Collection Time: 01/30/22  2:56 AM  Result Value Ref Range   WBC 7.2 4.0 - 10.5 K/uL   RBC 3.01 (L) 4.22 - 5.81 MIL/uL   Hemoglobin 9.3 (L) 13.0 - 17.0 g/dL   HCT 26.2 (L) 39.0 - 52.0 %   MCV 87.0 80.0 - 100.0 fL   MCH 30.9 26.0 - 34.0 pg   MCHC 35.5 30.0 -  36.0 g/dL   RDW 13.4 11.5 - 15.5 %   Platelets 163 150 - 400 K/uL   nRBC 0.0 0.0 - 0.2 %    Comment: Performed at [facility], [place]  [address], [place], [place] [address]   DG Chest 2 View  Result Date: 01/29/2022 CLINICAL DATA:  Multiple syncopal episodes over the past several weeks. EXAM: CHEST - 2 VIEW COMPARISON:  Chest x-ray dated October 19, 2021. FINDINGS: Unchanged left chest wall pacemaker. The heart size and mediastinal contours are within normal limits. Normal pulmonary vascularity. No focal consolidation, pleural effusion, or pneumothorax. Multiple skin folds again seen overlying the chest. No acute osseous abnormality. IMPRESSION: No active cardiopulmonary disease. Electronically Signed   By: Titus Dubin M.D.   On: 01/29/2022 17:32    PMH:   Past Medical History:  Diagnosis Date   Asthma    Carcinoid tumor, small intestine, malignant (HCC)    Chronic diarrhea    CKD (chronic kidney disease) stage 3, GFR 30-59 ml/min (HCC)    HTN (hypertension)    Short gut syndrome     PSH:   Past Surgical History:  Procedure Laterality Date   COLON SURGERY     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   Procedure: PACEMAKER IMPLANT;  Surgeon: Constance Haw, MD;  Location: [facility];  Service: Cardiovascular;  Laterality: N/A;    Allergies:  Allergies  Allergen Reactions   Penicillins Hives and Shortness Of Breath   Lisinopril Itching    Medications:   Prior to Admission medications   Medication Sig Start Date End Date Taking? Authorizing Provider  albuterol (VENTOLIN HFA) 108 (90 Base) MCG/ACT inhaler Inhale 2 puffs into the lungs every 6 (six) hours as needed for wheezing or shortness of breath. 01/28/22  Yes [name], MD  aspirin 81 MG EC tablet Take 81 mg by mouth daily. Swallow whole.   Yes [provider]  diphenoxylate-atropine (LOMOTIL) 2.5-0.025 MG tablet Take 1 tablet by mouth 2 (two) times daily as needed for diarrhea or loose stools.  11/26/21  Yes Brunetta Genera, MD  feeding supplement, ENSURE COMPLETE, (ENSURE COMPLETE) LIQD Take 237 mLs by mouth 2 (two) times daily between meals.   Yes [provider]  fludrocortisone (FLORINEF) 0.1 MG tablet Take 1 tablet (0.1 mg total) by mouth daily. 05/15/21  Yes Jerline Pain, MD  iron polysaccharides (NIFEREX) 150 MG capsule TAKE 1 CAPSULE(150 MG) BY MOUTH 3 TIMES A WEEK Patient taking differently: Take 150 mg by mouth 3 (three) times a week. 09/22/21  Yes Brunetta Genera, MD  LANREOTIDE ACETATE [place] Inject 1 Dose into the skin every 30 (thirty) days.   Yes [provider]  loperamide (IMODIUM A-D) 2 MG capsule Take 2 mg by mouth daily as needed for diarrhea or loose stools.   Yes [provider]  midodrine (PROAMATINE) 5 MG tablet Take 3 tablets (15 mg total) by mouth 3 (three) times daily with meals. 11/10/21  Yes Biagio Borg, MD  mirtazapine (REMERON) 30 MG tablet Take 1 tablet (30 mg total) by mouth daily. 01/12/22 01/07/23 Yes Reed, Tiffany L, DO  Multiple Vitamins-Minerals (PRESERVISION AREDS 2 PO) Take 1 capsule by mouth daily.   Yes [provider]  multivitamin (ONE-A-DAY MEN'S) TABS tablet Take 1 tablet by mouth daily.   Yes [provider]  Potassium Chloride ER 20 MEQ TBCR Take 1 tablet by mouth 2 (two) times daily. 11/10/21  Yes Biagio Borg, MD  SYMBICORT 160-4.5 MCG/ACT inhaler Inhale 2 puffs into the lungs daily as needed (wheezing). 01/28/22  Yes [name], MD  thiamine (VITAMIN B-1) 50 MG tablet Take 1 tablet (50 mg total) by mouth  daily. 12/10/21  Yes [name], MD  cholestyramine (QUESTRAN) 4 g packet MIX AND DRINK 1 PACKET(4 GRAMS) BY MOUTH TWICE DAILY WITH A MEAL Patient not taking: Reported on 01/30/2022 11/26/21   Brunetta Genera, MD  dronabinol (MARINOL) 2.5 MG capsule Take 1 capsule (2.5 mg total) by mouth 2 (two) times daily before a meal. Patient not taking: Reported on 01/30/2022 10/23/21   Barb Merino, MD    Discontinued Meds:   Medications Discontinued During This Encounter  Medication Reason   dronabinol (MARINOL) capsule 2.5 mg    mirtazapine (REMERON) tablet 30 mg    midodrine (PROAMATINE) tablet 15 mg    fludrocortisone (FLORINEF) tablet 0.1 mg     Social History:  reports that he has been smoking cigars and cigarettes. He started smoking about 63 years ago. He has a 18.30 pack-year smoking history. He has never used smokeless tobacco. He reports that he does not currently use alcohol. He reports that he does not use drugs.  Family History:   Family History  Problem Relation Age of Onset   Alzheimer's disease Mother     Blood pressure (!) 193/121, pulse 78, temperature 97.6 F (36.4 C), temperature source Oral, resp. rate 16, height '6\' 1"'$  (1.854 m), weight 60.5 kg, SpO2 95 %. General: Cachectic thin male, not in distress, looks comfortable HEENT: Sclera and conjunctiva normal. Respiration: Clear to ausculate bilateral, no wheezing or crackles Cardiovascular: RRR, S1S2 normal. No murmur. No rub or gallop. No LE edema Gastrointestinal: BS+. abdomen soft, NT, ND. Genitourinary: No CVA tenderness. Musculoskeletal: No joint swelling or tenderness. no cyanosis. Neurology: alert, awake. No focal deficit noticed. Hematology: no bruises or ecchymosis.        Dwana Melena, MD 01/30/2022, 12:13 PM

## 2022-01-30 NOTE — ED Notes (Signed)
Spoke with 83M charge RN via phone; states she is reviewing patient now and will initiate purple man process.

## 2022-01-30 NOTE — Progress Notes (Addendum)
PROGRESS NOTE  Alan [name]  JOA:[id number] DOB: May 04, 1941 DOA: 01/29/2022 PCP: [name], MD   Brief Narrative:  Patient is a 81 year old male with history of malignant carcinoid of the intestine being followed by oncology, chronic diarrhea, CKD status 3B with baseline creatinine around 2.6, chronic normocytic anemia, tachybradycardia syndrome status post pacemaker placement, paroxysmal A-fib not on anticoagulation due to history of GI bleed, prior history of PE who presented with complaints of frequent pre syncopal episodes at home.  Also found to have acute kidney injury on CKD with creatinine in the range of 3.  On presentation he was found to be severely orthostatic, but hypertensive.  Started on IV fluids.  Nephrology also consulted today.  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  Did not fall.  Lives with family found to be orthostatic on presentation.  Has history of chronic orthostatic hypotension.  On midodrine and fludrocortisone  Looks volume depleted so started on IV fluids.  Patient is hypertensive so we will discontinue midodrine and fludrocortisone. PT/OT consulted, will repeat orthostatics  AKI in CKD stage IIIb/non-anion gap metabolic acidosis: Baseline creatinine around 2.6.  Found to have acute kidney injury on presentation with creatinine in the range of 3.6.  Also has chronic diarrhea which could have worsened.  Resistant hypertension could also have progressed the CKD We will also start on sodium bicarb tablets.We will check urine sodium.Continue IV fluids.  He follows with nephrology, Dr. Carolin Sicks.  Nephrology consulted here as per patient's  request  Left renal mass: Patient recently underwent ultrasound of the kidneys on May which showed medical renal disease, possible left renal mass.  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  Also showed multiseptated cystic lesion on the posterior midportion of the right kidney, consistent with complex cyst.  He already follows with Dr. Irene Limbo as an outpatient.  We recommend outpatient follow-up, might also need urology follow-up as an outpatient if needed  Malignant carcinoid of the intestine: Follows with Dr. Irene Limbo, oncology.  Takes Imodium for chronic diarrhea.  Recent MRI of the abdomen showed metastatic carcinoid disease with masses in the liver, peritoneal metastatic disease  Right upper back /shoulder abscess: Recently drained by patient's primary care physician.  He still has some discharge and a pack  Wound care consulted.No need for antibiotics  Hypertension: Remains hypertensive in the emergency department.  Apparently not on any medications at home and is on midodrine, fludrocortisone.  Midodrine, fludrocortisone discontinued due to severe hypertension.  Started on amlodipine 10 mg daily.  We will continue as needed medications.  We will continue to titrate the medications.  Elevated troponin: Likely from demand ischemia from CKD, hypertension.  Elevated troponin with flat trend, no anginal symptoms.  History of tachybradycardia syndrome: Status post pacemaker placement.  Pacemaker interrogated in the ED.  No evidence of arrhythmia  Paroxysmal A-fib:  currently in normal sinus rhythm.  Not on anticoagulation due to history of GI bleed.  Monitor on telemetry  Chronic normocytic anemia: Most likely secondary to chronic kidney disease.  Currently hemoglobin stable.  On iron supplementation  History of PE: Not  on anticoagulation  Debility/deconditioning: PT/OT consulted.  Lives with wife.  Ambulates with help of cane         DVT prophylaxis:heparin injection 5,000 Units Start: 01/29/22 2215     Code Status: Full Code  Family Communication: called and discussed with wife on phone on 8/4  Patient status:Inpatient  Patient is from :Home  Anticipated discharge XN:TZGY  Estimated DC date:1-2 days, needs improvement in the kidney function, blood pressure improvement   Consultants: Nephrology  Procedures: None  Antimicrobials:  Anti-infectives (From admission, onward)    None       Subjective:  Patient seen and examined at bedside this morning.  Hypertensive during my evaluation.  Comfortable, alert and oriented and adamant about going home .  Had a long discussion at bedside about the need to stay in the hospital due to acute kidney injury, severe hypertension.  Patient then agreed to stay.  objective: Vitals:   01/30/22 0630 01/30/22 0645 01/30/22 0708 01/30/22 0726  BP: (!) 181/110 (!) 184/105  (!) 168/110  Pulse: 73 70  73  Resp: '16 14  18  '$ Temp:   97.7 F (36.5 C) 98.4 F (36.9 C)  TempSrc:   Oral Oral  SpO2: 98% 99%  100%  Weight:      Height:        Intake/Output Summary (Last 24 hours) at 01/30/2022 0815 Last data filed at 01/30/2022 0551 Gross per 24 hour  Intake 500 ml  Output --  Net 500 ml   Filed Weights   01/29/22 2014  Weight: 60.5 kg    Examination:  General exam: Overall comfortable, not in distress, pleasant elderly male, deconditioned HEENT: PERRL Respiratory system:  no wheezes or crackles  Cardiovascular system: S1 & S2 heard, RRR.  Gastrointestinal system: Abdomen is nondistended, soft and nontender. Central nervous system: Alert and oriented Extremities: No edema, no clubbing ,no cyanosis, recently drained wound on the right upper back, packed with gauze Skin: No rashes, no ulcers,no icterus     Data Reviewed: I have personally reviewed following labs and imaging studies  CBC: Recent Labs  Lab 01/27/22 1510 01/29/22 1659 01/29/22 2223  01/30/22 0256  WBC 7.0 7.4 6.4 7.2  NEUTROABS 4.7 5.9  --   --   HGB 10.0* 9.5* 9.2* 9.3*  HCT 28.6* 27.1* 25.6* 26.2*  MCV 87.5 87.7 87.1 87.0  PLT 196 185 160 174   Basic Metabolic Panel: Recent Labs  Lab 01/27/22 1510 01/29/22 1659 01/29/22 2223 01/30/22 0256  NA 141 138  --  141  K 4.4 4.1  --  4.3  CL 108 106  --  111  CO2 25 20*  --  21*  GLUCOSE 112* 128*  --  104*  BUN 49* 50*  --  47*  CREATININE 3.68* 3.83* 3.52* 3.37*  CALCIUM 9.3 9.2  --  8.9     No results found for this or any previous visit (from the past 240 hour(s)).   Radiology Studies: DG Chest 2 View  Result Date: 01/29/2022 CLINICAL DATA:  Multiple syncopal episodes over the past several weeks. EXAM: CHEST - 2 VIEW COMPARISON:  Chest x-ray dated October 19, 2021. FINDINGS: Unchanged left chest wall pacemaker. The heart size and mediastinal contours are within normal limits. Normal pulmonary vascularity. No focal consolidation, pleural effusion, or pneumothorax. Multiple skin folds again seen overlying the chest. No acute osseous abnormality. IMPRESSION: No active cardiopulmonary disease. Electronically Signed   By: Titus Dubin M.D.   On:  01/29/2022 17:32    Scheduled Meds:  fludrocortisone  0.1 mg Oral Daily   heparin  5,000 Units Subcutaneous Q8H   midodrine  15 mg Oral TID WC   mirtazapine  30 mg Oral QHS   Continuous Infusions:   LOS: 0 days   Shelly Coss, MD [facility] P8/09/2021, 8:15 AM

## 2022-01-31 DIAGNOSIS — R609 Edema, unspecified: Secondary | ICD-10-CM | POA: Diagnosis not present

## 2022-01-31 DIAGNOSIS — D500 Iron deficiency anemia secondary to blood loss (chronic): Secondary | ICD-10-CM | POA: Diagnosis not present

## 2022-01-31 DIAGNOSIS — Z86711 Personal history of pulmonary embolism: Secondary | ICD-10-CM | POA: Diagnosis not present

## 2022-01-31 DIAGNOSIS — K909 Intestinal malabsorption, unspecified: Secondary | ICD-10-CM | POA: Diagnosis not present

## 2022-01-31 DIAGNOSIS — C787 Secondary malignant neoplasm of liver and intrahepatic bile duct: Secondary | ICD-10-CM | POA: Diagnosis present

## 2022-01-31 DIAGNOSIS — I951 Orthostatic hypotension: Principal | ICD-10-CM

## 2022-01-31 DIAGNOSIS — Z950 Presence of cardiac pacemaker: Secondary | ICD-10-CM | POA: Diagnosis not present

## 2022-01-31 DIAGNOSIS — E860 Dehydration: Secondary | ICD-10-CM | POA: Diagnosis present

## 2022-01-31 DIAGNOSIS — D649 Anemia, unspecified: Secondary | ICD-10-CM | POA: Diagnosis not present

## 2022-01-31 DIAGNOSIS — N185 Chronic kidney disease, stage 5: Secondary | ICD-10-CM | POA: Diagnosis present

## 2022-01-31 DIAGNOSIS — E340 Carcinoid syndrome: Secondary | ICD-10-CM | POA: Diagnosis present

## 2022-01-31 DIAGNOSIS — E876 Hypokalemia: Secondary | ICD-10-CM | POA: Diagnosis not present

## 2022-01-31 DIAGNOSIS — N189 Chronic kidney disease, unspecified: Secondary | ICD-10-CM

## 2022-01-31 DIAGNOSIS — I10 Essential (primary) hypertension: Secondary | ICD-10-CM | POA: Diagnosis not present

## 2022-01-31 DIAGNOSIS — K912 Postsurgical malabsorption, not elsewhere classified: Secondary | ICD-10-CM | POA: Diagnosis not present

## 2022-01-31 DIAGNOSIS — I480 Paroxysmal atrial fibrillation: Secondary | ICD-10-CM | POA: Diagnosis present

## 2022-01-31 DIAGNOSIS — R531 Weakness: Secondary | ICD-10-CM | POA: Diagnosis not present

## 2022-01-31 DIAGNOSIS — C7A019 Malignant carcinoid tumor of the small intestine, unspecified portion: Secondary | ICD-10-CM

## 2022-01-31 DIAGNOSIS — D509 Iron deficiency anemia, unspecified: Secondary | ICD-10-CM | POA: Diagnosis present

## 2022-01-31 DIAGNOSIS — Z681 Body mass index (BMI) 19 or less, adult: Secondary | ICD-10-CM | POA: Diagnosis not present

## 2022-01-31 DIAGNOSIS — R0602 Shortness of breath: Secondary | ICD-10-CM | POA: Diagnosis not present

## 2022-01-31 DIAGNOSIS — C786 Secondary malignant neoplasm of retroperitoneum and peritoneum: Secondary | ICD-10-CM | POA: Diagnosis present

## 2022-01-31 DIAGNOSIS — L02212 Cutaneous abscess of back [any part, except buttock]: Secondary | ICD-10-CM

## 2022-01-31 DIAGNOSIS — J45909 Unspecified asthma, uncomplicated: Secondary | ICD-10-CM | POA: Diagnosis not present

## 2022-01-31 DIAGNOSIS — E872 Acidosis, unspecified: Secondary | ICD-10-CM | POA: Diagnosis present

## 2022-01-31 DIAGNOSIS — Z9181 History of falling: Secondary | ICD-10-CM | POA: Diagnosis not present

## 2022-01-31 DIAGNOSIS — I959 Hypotension, unspecified: Secondary | ICD-10-CM | POA: Diagnosis present

## 2022-01-31 DIAGNOSIS — I495 Sick sinus syndrome: Secondary | ICD-10-CM

## 2022-01-31 DIAGNOSIS — R2689 Other abnormalities of gait and mobility: Secondary | ICD-10-CM | POA: Diagnosis not present

## 2022-01-31 DIAGNOSIS — E43 Unspecified severe protein-calorie malnutrition: Secondary | ICD-10-CM | POA: Diagnosis not present

## 2022-01-31 DIAGNOSIS — R1312 Dysphagia, oropharyngeal phase: Secondary | ICD-10-CM | POA: Diagnosis not present

## 2022-01-31 DIAGNOSIS — M6281 Muscle weakness (generalized): Secondary | ICD-10-CM | POA: Diagnosis not present

## 2022-01-31 DIAGNOSIS — N1832 Chronic kidney disease, stage 3b: Secondary | ICD-10-CM | POA: Diagnosis not present

## 2022-01-31 DIAGNOSIS — I129 Hypertensive chronic kidney disease with stage 1 through stage 4 chronic kidney disease, or unspecified chronic kidney disease: Secondary | ICD-10-CM | POA: Diagnosis not present

## 2022-01-31 DIAGNOSIS — R2681 Unsteadiness on feet: Secondary | ICD-10-CM | POA: Diagnosis not present

## 2022-01-31 DIAGNOSIS — N179 Acute kidney failure, unspecified: Secondary | ICD-10-CM

## 2022-01-31 DIAGNOSIS — R0902 Hypoxemia: Secondary | ICD-10-CM | POA: Diagnosis not present

## 2022-01-31 DIAGNOSIS — Z7401 Bed confinement status: Secondary | ICD-10-CM | POA: Diagnosis not present

## 2022-01-31 DIAGNOSIS — R778 Other specified abnormalities of plasma proteins: Secondary | ICD-10-CM | POA: Diagnosis present

## 2022-01-31 DIAGNOSIS — R64 Cachexia: Secondary | ICD-10-CM | POA: Diagnosis present

## 2022-01-31 DIAGNOSIS — R55 Syncope and collapse: Secondary | ICD-10-CM | POA: Diagnosis not present

## 2022-01-31 DIAGNOSIS — I120 Hypertensive chronic kidney disease with stage 5 chronic kidney disease or end stage renal disease: Secondary | ICD-10-CM | POA: Diagnosis present

## 2022-01-31 DIAGNOSIS — L02413 Cutaneous abscess of right upper limb: Secondary | ICD-10-CM | POA: Diagnosis present

## 2022-01-31 DIAGNOSIS — K529 Noninfective gastroenteritis and colitis, unspecified: Secondary | ICD-10-CM | POA: Diagnosis not present

## 2022-01-31 DIAGNOSIS — R42 Dizziness and giddiness: Secondary | ICD-10-CM | POA: Diagnosis not present

## 2022-01-31 DIAGNOSIS — D631 Anemia in chronic kidney disease: Secondary | ICD-10-CM | POA: Diagnosis present

## 2022-01-31 DIAGNOSIS — F17290 Nicotine dependence, other tobacco product, uncomplicated: Secondary | ICD-10-CM | POA: Diagnosis present

## 2022-01-31 DIAGNOSIS — N184 Chronic kidney disease, stage 4 (severe): Secondary | ICD-10-CM | POA: Diagnosis not present

## 2022-01-31 LAB — CBC
HCT: 27.2 % — ABNORMAL LOW (ref 39.0–52.0)
Hemoglobin: 9.3 g/dL — ABNORMAL LOW (ref 13.0–17.0)
MCH: 30.2 pg (ref 26.0–34.0)
MCHC: 34.2 g/dL (ref 30.0–36.0)
MCV: 88.3 fL (ref 80.0–100.0)
Platelets: 153 10*3/uL (ref 150–400)
RBC: 3.08 MIL/uL — ABNORMAL LOW (ref 4.22–5.81)
RDW: 13.4 % (ref 11.5–15.5)
WBC: 8.9 10*3/uL (ref 4.0–10.5)
nRBC: 0 % (ref 0.0–0.2)

## 2022-01-31 LAB — BASIC METABOLIC PANEL
Anion gap: 9 (ref 5–15)
BUN: 45 mg/dL — ABNORMAL HIGH (ref 8–23)
CO2: 21 mmol/L — ABNORMAL LOW (ref 22–32)
Calcium: 8.7 mg/dL — ABNORMAL LOW (ref 8.9–10.3)
Chloride: 110 mmol/L (ref 98–111)
Creatinine, Ser: 3.23 mg/dL — ABNORMAL HIGH (ref 0.61–1.24)
GFR, Estimated: 19 mL/min — ABNORMAL LOW (ref >60)
Glucose, Bld: 107 mg/dL — ABNORMAL HIGH (ref 70–99)
Potassium: 3.9 mmol/L (ref 3.5–5.1)
Sodium: 140 mmol/L (ref 135–145)

## 2022-01-31 LAB — WOUND CULTURE

## 2022-01-31 LAB — GLUCOSE, CAPILLARY: Glucose-Capillary: 102 mg/dL — ABNORMAL HIGH (ref 70–99)

## 2022-01-31 MED ORDER — ALBUTEROL SULFATE (2.5 MG/3ML) 0.083% IN NEBU
2.5000 mg | INHALATION_SOLUTION | RESPIRATORY_TRACT | Status: DC | PRN
Start: 2022-01-31 — End: 2022-02-01

## 2022-01-31 MED ORDER — ALBUTEROL SULFATE (2.5 MG/3ML) 0.083% IN NEBU
INHALATION_SOLUTION | RESPIRATORY_TRACT | Status: AC
  Administered 2022-01-31: 2.5 mg via RESPIRATORY_TRACT
  Filled 2022-01-31: qty 3

## 2022-01-31 NOTE — Assessment & Plan Note (Addendum)
Chronic diarrhea.   Follow up with oncology as outpatient.  He has been instructed to monitor input and output, maintain hydration and nutrition.   Left renal mass. 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 He already follows up with Dr Irene Limbo as outpatient.   Anemia of chronic disease. hgb has been stable, possible anemia related to malignancy.  Continue iron supplementation for iron deficiency anemia.

## 2022-01-31 NOTE — Assessment & Plan Note (Signed)
Pacemaker in place

## 2022-01-31 NOTE — Progress Notes (Signed)
Patient appeared to vagal while in restroom with mobility specialist and passed out when stood up from [employer] specialist able to get [facility] under patient and nurse responded, patient had a putse and was breathing but not responsive, Charge nurse, Rapid and provider called to bed side.  After approx 5 minutes patient was waking up and at 11:13am  completely back to baseline.

## 2022-01-31 NOTE — Assessment & Plan Note (Signed)
Not on anticoagulation due to history of gastrointestinal bleeding.

## 2022-01-31 NOTE — Assessment & Plan Note (Signed)
Continue close monitoring, not on antihypertensive medications due to severe orthostatic hypotension.  Follow up as outpatient.

## 2022-01-31 NOTE — Discharge Summary (Addendum)
Physician Discharge Summary   Patient: Alan [name] MRN: [id number] DOB: [birth year]  Admit date:     01/29/2022  Discharge date: 02/01/22  Discharge Physician: Alan [name] Alan [name]   PCP: Alan Lima, MD   Recommendations at discharge:    Patient will continue midodrine and fludrocortisone for orthostatic hypotension. If blood pressure systolic 409 or greater will hold midodrine dose.  At rest with elevated blood pressure 811 mmHg systolic, will need close follow up as outpatient, if worsening adjustments will need to be made.  Follow up with Alan [name] in 7 to 10 days Follow up renal function in 7 to 10 days Continue K supplementation in the setting of chronic diarrhea. Follow up with nephrology and cardiology as outpatient.  I spoke over the phone with the patient's son about patient's  condition, plan of care, prognosis and all questions were addressed.   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 [facility] Course: Alan [name] was admitted to the hospital with the working diagnosis of orthostatic syncope.   81 yo male with the past medical history of intestinal carcinoid, chronic diarrhea, CKD stage 3, anemia, paroxysmal atrial fibrillation, pulmonary embolism, tachy brady syndrome sp pacemaker, who presented with syncope episode. Patient had orthostatic syncope in the past. This time he had another syncope while trying to stand from a seating position. On his initial physical examination he was orthostatic with systolic blood pressure drooping from 200 to 81 after standing for 3 minutes. Blood pressure 105/72, HR 66,  rr 18 and 02 saturation 100%, lungs with no rales or rhonchi, heart with S1 and S2 present and rhythmic, abdomen not distended and no lower extremity edema.   Na 138, K 4,1 Cl 106, bicarbonate 20 glucose 128, bun 59 cr 3,83  High sensitive troponin 25 and 30  Wbc 7,4 hgb 9,5 plt 185  Urine analysis SG 1,009, 100 protein, 0-5 wbc   Chest radiograph with no cardiomegaly, pacemaker in place with one atrial and one ventricular lead, distended gastric bubble.  EKG 68 bpm, normal axis, normal intervals, sinus rhythm with q wave V1 and V2, LVH, no significant ST segment or T wave changes.   Patient was placed on IV fluids with good toleration.  Renal function has been improving.   Follow up orthostatics have improved. Diarrhea has improved.   Patient will be resumed on midodrine and fludrocortisone and will follow up as outpatient.   Assessment and Plan: * Orthostatic hypotension Patient received IV fluids with improvement of orthostatics. No further syncope episodes.  Midodrine and fludrocortisone were transitory held.   At the time of his discharge his blood pressure 190/107 lying, 130/97 seating and 127/77 standing.  Patient will allow to have elevated blood pressure systolic 914 to 782 supine, will hold on further antihypertensive medications than can provoke worsening orthostatic syncope.  He will resume midodrine and fludrocortisone in am.  Will need close follow up as outpatient with home blood pressure monitoring, if systolic above 190 may need further adjustments to his medications.  Will consult social services to arrange home nurse.   I have explained the plan to Alan [name] in detail  and also spoke with his son over the phone about the plan and all questions have been addressed. Patient will need outpatient follow up with cardiology and nephrology.   Sinus node dysfunction ([place]) Pacemaker in place.   Back abscess Recently treated and healing well.  Follow up as  outpatient.   Acute kidney injury superimposed on chronic kidney disease ([place]) CKD stage 3b  Patient received IV fluids with good toleration, at the time of his discharge he is euvolemic.   At the time of his discharge his renal functio had a serum cr of 3.23 with K at 3,9 and serum bicarbonate at 21, Na 140.   Plan to continue close follow up as outpatient with nephrology.   Essential hypertension Continue close monitoring, not on antihypertensive medications due to severe orthostatic hypotension.  Follow up as outpatient.   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   Follow up with oncology as outpatient.  He has been instructed to monitor input and output, maintain hydration and nutrition.   Left renal mass. 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 He already follows up with Alan Irene [name] as outpatient.   Anemia of chronic disease. hgb has been stable, possible anemia related to malignancy.  Continue iron supplementation for iron deficiency anemia.   Paroxysmal atrial fibrillation (HCC) Patient is rate control and sinus rhythm. Not on anticoagulation due to hx of GI bleeding.   History of pulmonary embolism Not on anticoagulation due to history of gastrointestinal bleeding.          Consultants: nephrology  Procedures performed: none  Disposition: Home Diet recommendation:  Regular  DISCHARGE MEDICATION: Allergies as of 01/31/2022       Reactions   Penicillins Hives, Shortness Of Breath   Lisinopril Itching        Medication List     STOP taking these medications    cholestyramine 4 g packet Commonly known as: QUESTRAN   dronabinol 2.5 MG capsule Commonly known as: MARINOL       TAKE these medications    albuterol 108 (90 Base) MCG/ACT inhaler Commonly known as: VENTOLIN HFA Inhale 2 puffs into the lungs every 6 (six) hours as needed for wheezing or shortness of breath.    aspirin EC 81 MG tablet Take 81 mg by mouth daily. Swallow whole.   diphenoxylate-atropine 2.5-0.025 MG tablet Commonly known as: LOMOTIL Take 1 tablet by mouth 2 (two) times daily as needed for diarrhea or loose stools.   feeding supplement (ENSURE COMPLETE) Liqd Take 237 mLs by mouth 2 (two) times daily between meals.   fludrocortisone 0.1 MG tablet Commonly known as: FLORINEF Take 1 tablet (0.1 mg total) by mouth daily.   Imodium A-D 2 MG capsule Generic drug: loperamide Take 2 mg by mouth daily as needed for diarrhea or loose stools.   iron polysaccharides 150 MG capsule Commonly known as: NIFEREX TAKE 1 CAPSULE(150 MG) BY MOUTH 3 TIMES A WEEK What changed: See the new instructions.   LANREOTIDE ACETATE [place] Inject 1 Dose into the skin every 30 (thirty) days.   midodrine 5 MG tablet Commonly known as: PROAMATINE Take 3 tablets (15 mg total) by mouth 3 (three) times daily with meals.   mirtazapine 30 MG tablet Commonly known as: REMERON Take 1 tablet (30 mg total) by mouth daily.   multivitamin Tabs tablet Take 1 tablet by mouth daily.   Potassium Chloride ER 20 MEQ Tbcr Take 1  tablet by mouth 2 (two) times daily.   PRESERVISION AREDS 2 PO Take 1 capsule by mouth daily.   Symbicort 160-4.5 MCG/ACT inhaler Generic drug: budesonide-formoterol Inhale 2 puffs into the lungs daily as needed (wheezing).   thiamine 50 MG tablet Commonly known as: VITAMIN B-1 Take 1 tablet (50 mg total) by mouth daily.               Discharge Care Instructions  (From admission, onward)           Start     Ordered   01/31/22 0000  Discharge wound care:       Comments: Pack right posterior scapula wound Q day using gauze packing strip (left at the bedside) and swab to fill, then cover with foam dressing.  (Change foam dressing Q 3 days or PRN soiling.)   01/31/22 1053            Discharge Exam: Filed Weights   01/29/22 2014  Weight: 60.5 kg   BP (!) 161/110  (BP Location: Left Arm)   Pulse 72   Temp 97.8 F (36.6 C) (Oral)   Resp 17   Ht '6\' 1"'$  (1.854 m)   Wt 60.5 kg   SpO2 100%   BMI 17.60 kg/m   Patient with no chest pain or dyspnea, no dizziness or lightheadedness, no orthostatic symptoms.  Neurology awake and alert ENT with mild pallor Cardiovascular with S1 and S2 present and rhythmic with no gallops, rubs or murmurs Respiratory with no rales or wheezing Abdomen not distended No lower extremity edema   Condition at discharge: stable  The results of significant diagnostics from this hospitalization (including imaging, microbiology, ancillary and laboratory) are listed below for reference.   Imaging Studies: DG Chest 2 View  Result Date: 01/29/2022 CLINICAL DATA:  Multiple syncopal episodes over the past several weeks. EXAM: CHEST - 2 VIEW COMPARISON:  Chest x-ray dated October 19, 2021. FINDINGS: Unchanged left chest wall pacemaker. The heart size and mediastinal contours are within normal limits. Normal pulmonary vascularity. No focal consolidation, pleural effusion, or pneumothorax. Multiple skin folds again seen overlying the chest. No acute osseous abnormality. IMPRESSION: No active cardiopulmonary disease. Electronically Signed   By: Titus Dubin M.D.   On: 01/29/2022 17:32   EEG adult  Result Date: 01/28/2022 Cameron Sprang, MD     01/28/2022 11:08 AM ELECTROENCEPHALOGRAM REPORT Date of Study: 01/28/2022 Patient's Name: Leng Montesdeoca Laurent MRN: [id number] Date of Birth: 12-05-1940 Referring Provider: Dr. Ellouise Newer Clinical History: This is an 81 year old man with recurrent dizziness, staring episodes, and syncope. EEG for classification. Medications: Aspirin, Questran, Lomotil, Marinol, Florinef, Proamantine, Remeron, Thiamine Technical Summary: A multichannel digital EEG recording measured by the international 10-20 system with electrodes applied with paste and impedances below 5000 ohms performed in our laboratory with EKG monitoring in  an awake and asleep patient.  Hyperventilation was not performed. Photic stimulation was performed.  The digital EEG was referentially recorded, reformatted, and digitally filtered in a variety of bipolar and referential montages for optimal display.  Description: The patient is awake and asleep during the recording.  During maximal wakefulness, there is a symmetric, medium voltage 8 Hz posterior dominant rhythm that attenuates with eye opening.  The record is symmetric.  During drowsiness and sleep, there is an increase in theta slowing of the background with vertex waves seen. Photic stimulation did not elicit any abnormalities.  There were no epileptiform discharges or electrographic seizures seen.  EKG  lead was unremarkable. Impression: This awake and asleep EEG is normal.  Clinical Correlation: A normal EEG does not exclude a clinical diagnosis of epilepsy.  If further clinical questions remain, prolonged EEG may be helpful.  Clinical correlation is advised. Ellouise Newer, M.D.   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 EXAM: MRI ABDOMEN WITHOUT CONTRAST TECHNIQUE: Multiplanar multisequence MR imaging was performed without the administration of intravenous contrast. COMPARISON:  11/18/2021 FINDINGS: Lower chest: No acute findings. Hepatobiliary: Intrinsically T2 hyperintense mass of the central liver dome, hepatic segment VII/VIII measuring 4.2 x 4.1 cm, not significantly changed compared to prior PET-CT accounting for cross modality comparison when measured with similar technique (series 5, image 4). Similar lesion adjacent to the falciform ligament, hepatic segment III/IV B, measuring 1.4 x 1.3 cm, previously noted on PET examination but not clearly visualized by noncontrast CT (series 5, image 15). Benign, lobulated fluid signal cyst of the inferior right lobe of the liver adjacent to the gallbladder fossa  (series 5, image 15). No gallstones. No biliary ductal dilatation. Pancreas: No mass, inflammatory changes, or other parenchymal abnormality identified.No pancreatic ductal dilatation. Spleen:  Within normal limits in size and appearance. Adrenals/Urinary Tract: Normal adrenal glands. Intrinsically T2 hypointense, T1 heterogeneous exophytic solid lesion of the posterior midportion of the left kidney measuring 2.6 x 2.2 cm (series 5, image 22). Multiseptated cystic lesion of the posterior midportion of the right kidney measuring 1.9 x 1.2 cm (series 5, image 12, series 4, image 6). Multiple additional simple appearing, fluid signal benign renal cortical cysts bilaterally. Nonobstructive calculus of the inferior pole of the right kidney. No evidence of hydronephrosis. Stomach/Bowel: Visualized portions within the abdomen are unremarkable. Vascular/Lymphatic: No pathologically enlarged lymph nodes identified. No abdominal aortic aneurysm demonstrated. Other:  Trace perihepatic ascites. Musculoskeletal: No suspicious osseous lesions identified. IMPRESSION: 1. Solid lesion of the posterior midportion of the left kidney measuring 2.6 x 2.2 cm, in keeping with ultrasound findings and suspicious for a renal cell carcinoma. Please note that noncontrast MR is significantly limited for the characterization of abdominal organ lesions, and generally should be evaluated. Stable chronic renal disease is generally not a contraindication to gadolinium contrast administration. 2. Multiseptated cystic lesion of the posterior midportion of the right kidney measuring 1.9 x 1.2 cm, consistent with a complex cyst although incompletely characterized for contrast enhancement, as above. 3. Multiple additional simple appearing benign appearing renal cortical cysts bilaterally, for which no further follow-up or characterization is required. 4. Masses of the liver dome and left lobe of the liver, not significantly changed compared to prior  PET-CT, consistent with known metastatic carcinoid. 5. 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 6. Trace perihepatic ascites. Electronically Signed   By: Delanna Ahmadi M.D.   On: 01/26/2022 15:30   MR ANGIO HEAD WO CONTRAST  Result Date: 01/26/2022 CLINICAL DATA:  Episodes of staring. Dizziness. Syncope, unspecified syncope type. EXAM: MRI HEAD WITHOUT CONTRAST MRA HEAD WITHOUT CONTRAST TECHNIQUE: Multiplanar, multi-echo pulse sequences of the brain and surrounding structures were acquired without intravenous contrast. Angiographic images of the Circle of Willis were acquired using MRA technique without intravenous contrast. COMPARISON:  Head CT 04/22/2021. FINDINGS: MRI HEAD FINDINGS Brain: Mild for age generalized parenchymal atrophy. Multifocal T2 FLAIR hyperintense signal abnormality within the cerebral white matter and pons, nonspecific but compatible with mild chronic small vessel ischemic disease.  Prominent perivascular spaces in the bilateral deep gray nuclei. 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 There is no acute infarct. No evidence of an intracranial mass. No chronic intracranial blood products. No extra-axial fluid collection. No midline shift. Vascular: Maintained flow voids within the proximal large arterial vessels. Skull and upper cervical spine: No focal suspicious marrow lesion. Incompletely assessed cervical spondylosis. Sinuses/Orbits: No mass or acute finding within the imaged orbits. Prior bilateral ocular lens replacement. Minimal mucosal thickening within the bilateral ethmoid and right maxillary sinuses. MRA HEAD FINDINGS Anterior circulation: The intracranial internal carotid arteries are patent. The M1 middle cerebral arteries are patent. No M2 proximal branch occlusion or high-grade proximal stenosis. The anterior cerebral arteries are patent. The right A1 segment is developmentally  diminutive or absent. 2-3 mm inferiorly projecting vascular protrusion arising from the supraclinoid left ICA, which may reflect an aneurysm or infundibulum (for instance as seen on series 1038, image 4). 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 Posterior circulation: The intracranial vertebral arteries are patent. The basilar artery is patent. The posterior cerebral arteries are patent. A right posterior communicating artery is present. The left posterior communicating artery is diminutive or absent. Anatomic variants: As described. IMPRESSION: MRI brain: 1. No evidence of acute intracranial abnormality. 2. Mild chronic small vessel ischemic changes within the cerebral white matter and pons (including a chronic pontine lacunar infarct). 3. Mild generalized parenchymal atrophy. MRA head: 1. No intracranial large vessel occlusion or proximal high-grade arterial stenosis. 2. 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 3. 2-3 mm inferiorly projecting vascular protrusion arising from the supraclinoid left ICA, which may reflect an aneurysm or infundibulum. Electronically Signed   By: Kellie Simmering D.O.   On: 01/26/2022 14:45   MR BRAIN WO CONTRAST  Result Date: 01/26/2022 CLINICAL DATA:  Episodes of staring. Dizziness. Syncope, unspecified syncope type. EXAM: MRI HEAD WITHOUT CONTRAST MRA HEAD WITHOUT CONTRAST TECHNIQUE: Multiplanar, multi-echo pulse sequences of the brain and surrounding structures were acquired without intravenous contrast. Angiographic images of the Circle of Willis were acquired using MRA technique without intravenous contrast. COMPARISON:  Head CT 04/22/2021. FINDINGS: MRI HEAD FINDINGS Brain: Mild for age generalized parenchymal atrophy. Multifocal T2 FLAIR hyperintense signal abnormality within the cerebral white  matter and pons, nonspecific but compatible with mild chronic small vessel ischemic disease. Prominent perivascular spaces in the bilateral deep gray nuclei. 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 There is no acute infarct. No evidence of an intracranial mass. No chronic intracranial blood products. No extra-axial fluid collection. No midline shift. Vascular: Maintained flow voids within the proximal large arterial vessels. Skull and upper cervical spine: No focal suspicious marrow lesion. Incompletely assessed cervical spondylosis. Sinuses/Orbits: No mass or acute finding within the imaged orbits. Prior bilateral ocular lens replacement. Minimal mucosal thickening within the bilateral ethmoid and right maxillary sinuses. MRA HEAD FINDINGS Anterior circulation: The intracranial internal carotid arteries are patent. The M1 middle cerebral arteries are patent. No M2 proximal branch occlusion or high-grade proximal stenosis. The anterior cerebral arteries are patent. The right A1 segment is developmentally diminutive or absent. 2-3 mm inferiorly projecting vascular protrusion arising from the supraclinoid left ICA, which may reflect an aneurysm or infundibulum (for instance as seen on series 1038, image 4). 2 mm inferiorly projecting vascular protrusion arising from the cavernous left ICA,  likely reflecting a small aneurysm (series 1038, image 19). Posterior circulation: The intracranial vertebral arteries are patent. The basilar artery is patent. The posterior cerebral arteries are patent. A right posterior communicating artery is present. The left posterior communicating artery is diminutive or absent. Anatomic variants: As described. IMPRESSION: MRI brain: 1. No evidence of acute intracranial abnormality. 2. Mild chronic small vessel ischemic changes within the cerebral white matter and pons (including a chronic pontine lacunar infarct). 3. Mild generalized parenchymal atrophy. MRA head: 1. No  intracranial large vessel occlusion or proximal high-grade arterial stenosis. 2. 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 3. 2-3 mm inferiorly projecting vascular protrusion arising from the supraclinoid left ICA, which may reflect an aneurysm or infundibulum. Electronically Signed   By: Kellie Simmering D.O.   On: 01/26/2022 14:45   CUP PACEART REMOTE DEVICE CHECK  Result Date: 01/22/2022 Scheduled remote reviewed. Normal device function.  Next remote 91 days. Kathy Breach, RN, CCDS, [employer]  VAS US CAROTID  Result Date: 01/20/2022 Carotid Arterial Duplex Study Patient Name:  KALEE MCCLENATHAN [facility]  Date of Exam:   01/20/2022 Medical Rec #: [id number]        Accession #:    [id number] Date of Birth: Aug 31, 1940         Patient Gender: M Patient Age:   56 years Exam Location:  [facility] Procedure:      VAS US CAROTID Referring Phys: Ellouise Newer --------------------------------------------------------------------------------  Indications:       Dizziness. Risk Factors:      Hypertension. Comparison Study:  No prior study Performing Technologist: Maudry Mayhew MHA, RDMS, RVT, RDCS  Examination Guidelines: A complete evaluation includes B-mode imaging, spectral Doppler, color Doppler, and power Doppler as needed of all accessible portions of each vessel. Bilateral testing is considered an integral part of a complete examination. Limited examinations for reoccurring indications may be performed as noted.  Right Carotid Findings: +----------+--------+-------+--------+--------------------------------+--------+           PSV cm/sEDV    StenosisPlaque Description              Comments                   cm/s                                                    +----------+--------+-------+--------+--------------------------------+--------+ CCA Prox  52      14                                                       +----------+--------+-------+--------+--------------------------------+--------+ CCA Distal39      9              focal, smooth and calcific               +----------+--------+-------+--------+--------------------------------+--------+ ICA Prox  35      10             smooth, heterogenous and  calcific                                 +----------+--------+-------+--------+--------------------------------+--------+ ICA Distal55      20                                                      +----------+--------+-------+--------+--------------------------------+--------+ ECA       43      8                                                       +----------+--------+-------+--------+--------------------------------+--------+ +----------+--------+-------+----------------+-------------------+           PSV cm/sEDV cmsDescribe        Arm Pressure (mmHG) +----------+--------+-------+----------------+-------------------+ [id number]             Multiphasic, WNL                    +----------+--------+-------+----------------+-------------------+ +---------+--------+--+--------+--+---------+ VertebralPSV cm/s52EDV cm/s15Antegrade +---------+--------+--+--------+--+---------+  Left Carotid Findings: +----------+--------+-------+--------+--------------------------------+--------+           PSV cm/sEDV    StenosisPlaque Description              Comments                   cm/s                                                    +----------+--------+-------+--------+--------------------------------+--------+ CCA Prox  47      17             smooth and heterogenous                  +----------+--------+-------+--------+--------------------------------+--------+ CCA Distal42      14             smooth and heterogenous                   +----------+--------+-------+--------+--------------------------------+--------+ ICA Prox  29      15             smooth and heterogenous                  +----------+--------+-------+--------+--------------------------------+--------+ ICA Distal87      36                                                      +----------+--------+-------+--------+--------------------------------+--------+ ECA       34      5              smooth, heterogenous and  calcific                                 +----------+--------+-------+--------+--------------------------------+--------+ +----------+--------+--------+----------------+-------------------+           PSV cm/sEDV cm/sDescribe        Arm Pressure (mmHG) +----------+--------+--------+----------------+-------------------+ [id number]              Multiphasic, WNL                    +----------+--------+--------+----------------+-------------------+ +---------+--------+--+--------+--+---------+ VertebralPSV cm/s56EDV cm/s18Antegrade +---------+--------+--+--------+--+---------+   Summary: Right Carotid: Velocities in the right ICA are consistent with a 1-39% stenosis. Left Carotid: Velocities in the left ICA are consistent with a 1-39% stenosis. Vertebrals:  Bilateral vertebral arteries demonstrate antegrade flow. Subclavians: Normal flow hemodynamics were seen in bilateral subclavian              arteries. *See table(s) above for measurements and observations.  Electronically signed by Deitra Mayo MD on 01/20/2022 at 2:16:21 PM.    Final     Microbiology: Results for orders placed or performed during the hospital encounter of 04/22/21  Resp Panel by RT-PCR (Flu A&B, Covid) Nasopharyngeal Swab     Status: None   Collection Time: 04/22/21  7:56 PM   Specimen: Nasopharyngeal Swab; Nasopharyngeal(NP) swabs in vial transport medium  Result Value Ref Range Status   SARS  Coronavirus 2 by RT PCR NEGATIVE NEGATIVE Final    Comment: (NOTE) SARS-CoV-2 target nucleic acids are NOT DETECTED.  The SARS-CoV-2 RNA is generally detectable in upper respiratory specimens during the acute phase of infection. The lowest concentration of SARS-CoV-2 viral copies this assay can detect is 138 copies/mL. A negative result does not preclude SARS-Cov-2 infection and should not be used as the sole basis for treatment or other patient management decisions. A negative result may occur with  improper specimen collection/handling, submission of specimen other than nasopharyngeal swab, presence of viral mutation(s) within the areas targeted by this assay, and inadequate number of viral copies(<138 copies/mL). A negative result must be combined with clinical observations, patient history, and epidemiological information. The expected result is Negative.  Fact Sheet for Patients:  EntrepreneurPulse.com.au  Fact Sheet for Healthcare Providers:  IncredibleEmployment.be  This test is no t yet approved or cleared by the [place] FDA and  has been authorized for detection and/or diagnosis of SARS-CoV-2 by FDA under an Emergency Use Authorization (EUA). This EUA will remain  in effect (meaning this test can be used) for the duration of the COVID-19 declaration under Section 564(b)(1) of the Act, 21 U.S.C.section 360bbb-3(b)(1), unless the authorization is terminated  or revoked sooner.       Influenza A by PCR NEGATIVE NEGATIVE Final   Influenza B by PCR NEGATIVE NEGATIVE Final    Comment: (NOTE) The Xpert Xpress SARS-CoV-2/FLU/RSV plus assay is intended as an aid in the diagnosis of influenza from Nasopharyngeal swab specimens and should not be used as a sole basis for treatment. Nasal washings and aspirates are unacceptable for Xpert Xpress SARS-CoV-2/FLU/RSV testing.  Fact Sheet for  Patients: EntrepreneurPulse.com.au  Fact Sheet for Healthcare Providers: IncredibleEmployment.be  This test is not yet approved or cleared by the [place] FDA and has been authorized for detection and/or diagnosis of SARS-CoV-2 by FDA under an Emergency Use Authorization (EUA). This EUA will remain in effect (meaning this test can be used) for the duration of the COVID-19 declaration under Section 564(b)(1) of the  Act, 21 U.S.C. section 360bbb-3(b)(1), unless the authorization is terminated or revoked.  Performed at [facility], [place] [address], [place], [place] [address]   Surgical PCR screen     Status: None   Collection Time: 04/23/21  4:02 PM   Specimen: Nasal Mucosa; Nasal Swab  Result Value Ref Range Status   MRSA, PCR NEGATIVE NEGATIVE Final   Staphylococcus aureus NEGATIVE NEGATIVE Final    Comment: (NOTE) The Xpert SA Assay (FDA approved for NASAL specimens in patients 45 years of age and older), is one component of a comprehensive surveillance program. It is not intended to diagnose infection nor to guide or monitor treatment. Performed at [facility], [place] [address] [place], [place] [address]     Labs: CBC: Recent Labs  Lab 01/27/22 1510 01/29/22 1659 01/29/22 2223 01/30/22 0256 01/31/22 0348  WBC 7.0 7.4 6.4 7.2 8.9  NEUTROABS 4.7 5.9  --   --   --   HGB 10.0* 9.5* 9.2* 9.3* 9.3*  HCT 28.6* 27.1* 25.6* 26.2* 27.2*  MCV 87.5 87.7 87.1 87.0 88.3  PLT 196 185 160 163 443   Basic Metabolic Panel: Recent Labs  Lab 01/27/22 1510 01/29/22 1659 01/29/22 2223 01/30/22 0256 01/31/22 0348  NA 141 138  --  141 140  K 4.4 4.1  --  4.3 3.9  CL 108 106  --  111 110  CO2 25 20*  --  21* 21*  GLUCOSE 112* 128*  --  104* 107*  BUN 49* 50*  --  47* 45*  CREATININE 3.68* 3.83* 3.52* 3.37* 3.23*  CALCIUM 9.3 9.2  --  8.9 8.7*   Liver Function Tests: Recent Labs  Lab 01/27/22 1510 01/29/22 1659  AST 14*  18  ALT 13 16  ALKPHOS 48 50  BILITOT 0.6 0.7  PROT 7.3 6.6  ALBUMIN 4.2 3.8   CBG: No results for input(s): "GLUCAP" in the last 168 hours.  Discharge time spent: greater than 30 minutes.  Signed: Tawni Millers, MD [facility] 01/31/2022

## 2022-01-31 NOTE — Progress Notes (Signed)
[facility] Progress Note   81 y.o. 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 Irene Limbo, anemia, severe malnutrition, CKD III with a baseline creatinine of ~2.6 presenting with syncopal episode. He has a history of orthostatic hypotension but it seems to have increased in frequency. Cr also increased to 3.6 but at time of consultation the Cr had already decreased to 3.37.  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 Increased frequency of syncopal episodes may be a function of increased and continued GI losses + admitted poor oral intake. -  No need to repeat kidney ultrasound as unlikely to be obstructive. - Encouraged increased oral intake + hydration; easier said than done with a poor appetite despite Mirtazapine and Marinol.  - Renal function already improving and can f/u with [facility] regular appt; counseled him about waiting after changing positions, ensuring no dizziness before moving.  - Very difficult situation; unfortunately syncopal episode in bathroom with +hypotension. Amlodipine and hydralazine stopped but he has hypertension when supine but orthostasis with change in position.   Fortunately renal function slowly improved overnight.   Orthostatic hypotension - Currently on midodrine and Florinef. Should f/u cards upon d/c. 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 Following with oncologist Dr. Irene Limbo on lanreonide. Hypertension - Blood pressure control suboptimal but he has severe orthostatic hypotension. He is actually on midodrine and Florinef. Probably should follow up  with cardiologist. Hasn't  seen cards in a while. Malnutrition - Currently on appetite stimulants. Encourage oral intake including protein. He has significant muscle loss.  Subjective:   Syncopal episode after using restroom today. D/c on hold. He's feeling better now.   Objective:   BP (!) 186/121   Pulse 89   Temp 97.8 F (36.6 C) (Oral)   Resp 17   Ht '6\' 1"'$  (1.854 m)   Wt 60.5 kg   SpO2 90%   BMI 17.60 kg/m   Intake/Output Summary (Last 24 hours) at 01/31/2022 1400 Last data filed at 01/31/2022 0800 Gross per 24 hour  Intake 1830.03 ml  Output 1225 ml  Net 605.03 ml   Weight change:   Physical Exam: General: Cachectic thin male HEENT: no conj pallor Respiration: Clear to ausculate bilateral Cardiovascular: RRR Gastrointestinal: BS+. abdomen soft, NT, ND. Genitourinary: No CVA tenderness. Neurology: alert, awake. No focal deficit noticed.  Imaging: DG Chest 2 View  Result Date: 01/29/2022 CLINICAL DATA:  Multiple syncopal episodes over the past several weeks. EXAM: CHEST - 2 VIEW COMPARISON:  Chest x-ray dated October 19, 2021. FINDINGS: Unchanged left chest wall pacemaker. The heart size and mediastinal contours are within normal limits. Normal pulmonary vascularity. No focal consolidation, pleural effusion, or pneumothorax. Multiple skin folds again seen overlying the chest. No acute osseous abnormality. IMPRESSION: No active cardiopulmonary disease. Electronically Signed   By: Titus Dubin M.D.   On: 01/29/2022 17:32    Labs: BMET Recent Labs  Lab 01/27/22 1510 01/29/22 1659 01/29/22 2223 01/30/22 0256 01/31/22 0348  NA 141 138  --  141 140  K 4.4 4.1  --  4.3 3.9  CL 108 106  --  111 110  CO2 25 20*  --  21* 21*  GLUCOSE 112* 128*  --  104* 107*  BUN 49* 50*  --  47* 45*  CREATININE 3.68* 3.83* 3.52* 3.37* 3.23*  CALCIUM 9.3 9.2  --  8.9 8.7*   CBC Recent Labs  Lab 01/27/22 1510 01/29/22 1659 01/29/22 2223 01/30/22 0256 01/31/22 0348  WBC 7.0 7.4 6.4 7.2 8.9  NEUTROABS 4.7  5.9  --   --   --   HGB 10.0* 9.5* 9.2* 9.3* 9.3*  HCT 28.6* 27.1* 25.6* 26.2* 27.2*  MCV 87.5 87.7 87.1 87.0 88.3  PLT 196 185 160 163 153    Medications:     aspirin EC  81 mg Oral Daily   heparin  5,000 Units Subcutaneous Q8H   iron polysaccharides  150 mg Oral Once per day on Mon Wed Fri   mirtazapine  30 mg Oral QHS   mometasone-formoterol  2 puff Inhalation BID      Otelia Santee, MD 01/31/2022, 2:00 PM

## 2022-01-31 NOTE — Evaluation (Signed)
Physical Therapy Evaluation Patient Details Name: Alan [name] MRN: [id number] DOB: [birth year] Today's Date: 01/31/2022  History of Present Illness  81 y.o. M admitted on 01/29/22 due to frequent syncopal episodes. Pt found to be hypertensive with acute kidney injury. 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  Clinical Impression   Pt admitted secondary to problem above with deficits below. PTA patient was walking with his cane and had wife supervision, especially for showering.  Pt currently requires minguard assist for ambulation due to continued drop in BP (sit 153/101, after walking seated 85/52) with pt being asymptomatic.  Anticipate patient will benefit from acute PT to address problems listed below.Will continue to follow acutely to maximize functional mobility independence and safety.          Recommendations for follow up therapy are one component of a multi-disciplinary discharge planning process, led by the attending physician.  Recommendations may be updated based on patient status, additional functional criteria and insurance authorization.  Follow Up Recommendations No PT follow up      Assistance Recommended at Discharge PRN  Patient can return home with the following  A little help with walking and/or transfers;Assistance with cooking/housework;Assist for transportation;Help with stairs or ramp for entrance    Equipment Recommendations None recommended by PT  Recommendations for Other Services       Functional Status Assessment Patient has had a recent decline in their functional status and demonstrates the ability to make significant improvements in function in a reasonable and predictable  amount of time.     Precautions / Restrictions Precautions Precautions: Fall;Other (comment) Precaution Comments: syncope Restrictions Weight Bearing Restrictions: No      Mobility  Bed Mobility Overal bed mobility: Independent                  Transfers Overall transfer level: Needs assistance Equipment used: Rolling walker (2 wheels) Transfers: Sit to/from Stand Sit to Stand: Supervision           General transfer comment: cues for hand placement with use of RW    Ambulation/Gait Ambulation/Gait assistance: Min guard Gait Distance (Feet): 120 Feet Assistive device: Rolling walker (2 wheels) Gait Pattern/deviations: Step-through pattern, Decreased stride length   Gait velocity interpretation: 1.31 - 2.62 ft/sec, indicative of limited community ambulator   General Gait Details: Patient with tendency to push RW too far ahead; tended to step outside legs of RW when turning and required cues for safe use  Stairs            Wheelchair Mobility    Modified Rankin (Stroke Patients Only)       Balance Overall balance assessment: Needs assistance Sitting-balance support: No upper extremity supported, Feet supported Sitting balance-Leahy Scale: Good Sitting balance - Comments: at EOB on PT arrival   Standing balance support: Bilateral upper extremity supported, Reliant on assistive device for balance Standing balance-Leahy Scale: Poor Standing balance comment: static standing with bil UE support as taking orthostatic standing BPs                             Pertinent Vitals/Pain Pain Assessment Pain Assessment: No/denies pain    Home Living Family/patient expects to be discharged to:: Private residence  Living Arrangements: Spouse/significant other Available Help at Discharge: Family;Available 24 hours/day Type of Home: House Home Access: Stairs to enter Entrance Stairs-Rails: Can reach both Entrance Stairs-Number of Steps: 5    Home Layout: One level Home Equipment: Shower seat - built in;Cane - single point;Wheelchair Probation officer (4 wheels);Rolling Walker (2 wheels);BSC/3in1;Grab bars - tub/shower Additional Comments: Pt reports (I) with all ADLs and mobility. He lives with his wife and son lives close and can provide intermittent assist if needed. Pt is able to enter house usingback door with no stairs to enter at home.    Prior Function Prior Level of Function : Needs assist             Mobility Comments: uses cane intermittently for balance ADLs Comments: sits to shower with wife supervising, takes cool shower to prevent vasovagal episodes     Hand Dominance   Dominant Hand: Right    Extremity/Trunk Assessment   Upper Extremity Assessment Upper Extremity Assessment: Defer to OT evaluation    Lower Extremity Assessment Lower Extremity Assessment: Generalized weakness    Cervical / Trunk Assessment Cervical / Trunk Assessment: Normal  Communication      Cognition Arousal/Alertness: Awake/alert Behavior During Therapy: WFL for tasks assessed/performed Overall Cognitive Status: Within Functional Limits for tasks assessed                                          General Comments General comments (skin integrity, edema, etc.): Pt reports he sometimes has warning signs that his BP is dropping and able to make it to a chair to sit down and sometimes does not have enough time/warning. He has taken precautions by removing furniture with sharp corners and wife is present when he showers.    Exercises     Assessment/Plan    PT Assessment Patient needs continued PT services  PT Problem List Decreased strength;Decreased activity tolerance;Decreased balance;Decreased mobility;Decreased knowledge of use of DME;Cardiopulmonary status limiting activity       PT Treatment Interventions DME instruction;Gait training;Stair training;Functional mobility training;Therapeutic  activities;Therapeutic exercise;Balance training;Patient/family education    PT Goals (Current goals can be found in the Care Plan section)  Acute Rehab PT Goals Patient Stated Goal: to stop having these spells PT Goal Formulation: With patient Time For Goal Achievement: 02/14/22 Potential to Achieve Goals: Good    Frequency Min 3X/week     Co-evaluation               AM-PAC PT "6 Clicks" Mobility  Outcome Measure Help needed turning from your back to your side while in a flat bed without using bedrails?: None Help needed moving from lying on your back to sitting on the side of a flat bed without using bedrails?: None Help needed moving to and from a bed to a chair (including a wheelchair)?: A Little Help needed standing up from a chair using your arms (e.g., wheelchair or bedside chair)?: A Little Help needed to walk in hospital room?: A Little Help needed climbing 3-5 steps with a railing? : A Little 6 Click Score: 20    End of Session Equipment Utilized During Treatment: Gait belt Activity Tolerance: Patient tolerated treatment well;Other (comment) (asymptomatic despite drop in BP) Patient left: in chair;with call bell/phone within reach;with chair alarm set Nurse Communication: Mobility status;Other (comment) (orthostatic BPs and after walking) PT Visit Diagnosis: History of falling (Z91.81)  Time: 6256-3893 PT Time Calculation (min) (ACUTE ONLY): 30 min   Charges:   PT Evaluation $PT Eval Low Complexity: 1 Low PT Treatments $Gait Training: 8-22 mins         Arby Barrette, PT Acute Rehabilitation Services  Office [phone]   Rexanne Mano 01/31/2022, 10:49 AM

## 2022-01-31 NOTE — Hospital Course (Addendum)
Alan [name] was admitted to the hospital with the working diagnosis of orthostatic syncope.   81 yo male with the past medical history of intestinal carcinoid, chronic diarrhea, CKD stage 3, anemia, paroxysmal atrial fibrillation, pulmonary embolism, tachy brady syndrome sp pacemaker, who presented with syncope episode. Patient had orthostatic syncope in the past. This time he had another syncope while trying to stand from a seating position. On his initial physical examination he was orthostatic with systolic blood pressure drooping from 200 to 81 after standing for 3 minutes. Blood pressure 105/72, HR 66, rr 18 and 02 saturation 100%, lungs with no rales or rhonchi, heart with S1 and S2 present and rhythmic, abdomen not distended and no lower extremity edema.   Na 138, K 4,1 Cl 106, bicarbonate 20 glucose 128, bun 59 cr 3,83  High sensitive troponin 25 and 30  Wbc 7,4 hgb 9,5 plt 185  Urine analysis SG 1,009, 100 protein, 0-5 wbc   Chest radiograph with no cardiomegaly, pacemaker in place with one atrial and one ventricular lead, distended gastric bubble.  EKG 68 bpm, normal axis, normal intervals, sinus rhythm with q wave V1 and V2, LVH, no significant ST segment or T wave changes.   Patient was placed on IV fluids with good toleration.  Renal function has been improving.   Follow up orthostatics have improved. Diarrhea has improved.   Patient will be resumed on midodrine and fludrocortisone and will follow up as outpatient.

## 2022-01-31 NOTE — Assessment & Plan Note (Signed)
CKD stage 3b  Patient received IV fluids with good toleration, at the time of his discharge he is euvolemic.   At the time of his discharge his renal functio had a serum cr of 3.23 with K at 3,9 and serum bicarbonate at 21, Na 140.   Plan to continue close follow up as outpatient with nephrology.

## 2022-01-31 NOTE — Assessment & Plan Note (Signed)
Patient received IV fluids with improvement of orthostatics. No further syncope episodes.  Midodrine and fludrocortisone were transitory held.   At the time of his discharge his blood pressure 190/107 lying, 130/97 seating and 127/77 standing.  Patient will allow to have elevated blood pressure systolic 072 to 257 supine, will hold on further antihypertensive medications than can provoke worsening orthostatic syncope.  He will resume midodrine and fludrocortisone in am.  Will need close follow up as outpatient with home blood pressure monitoring, if systolic above 190 may need further adjustments to his medications.  Will consult social services to arrange home nurse.   I have explained the plan to Alan [name] in detail and also spoke with his son over the phone about the plan and all questions have been addressed. Patient will need outpatient follow up with cardiology and nephrology.

## 2022-01-31 NOTE — Progress Notes (Signed)
Patient had a syncope episode in the bathroom after moving his bowels, positive hypotension. He was repositioned in bed, his legs were elevated and he recovered his consciousness.   Amlodipine and hydralazine have been discontinued.  Will cancel discharge for today and will re evaluate for possible discharge tomorrow morning.

## 2022-01-31 NOTE — Progress Notes (Signed)
Mobility Specialist Progress Note   01/31/22 1111  Mobility  Activity Ambulated with assistance in hallway;Ambulated with assistance to bathroom  Level of Assistance +2 (takes two people) ([place])  Information systems manager Ambulated (ft) 260 ft  Activity Response Tolerated poorly  $Mobility charge 1 Mobility   During Mobility: 140/95 BP Post Mobility: 186/121 BP  Received pt setting chair alarm off trying to get back to bed. While up, Pt agreeable for short ambulation. Min G for ambulation + min cues for safe RW mechanics. No faults during. Returned back to BR where pt had a successful BM. Upon standing pt c/o lightheadedness and became unresponsive to cues. Placed pt on BSC as pt was having a vagal episode. With assistance from responding RN's able to get pt safely back to bed for MD and RN's to assess.   Holland Falling Mobility Specialist MS [facility] #:  [phone] Acute Rehab Office:  [phone]

## 2022-01-31 NOTE — Assessment & Plan Note (Signed)
Recently treated and healing well.  Follow up as outpatient.

## 2022-01-31 NOTE — Assessment & Plan Note (Signed)
Patient is rate control and sinus rhythm. Not on anticoagulation due to hx of GI bleeding.

## 2022-02-01 ENCOUNTER — Other Ambulatory Visit: Payer: Self-pay

## 2022-02-01 ENCOUNTER — Inpatient Hospital Stay (HOSPITAL_COMMUNITY)
Admission: EM | Admit: 2022-02-01 | Discharge: 2022-02-09 | Disposition: A | Discharge status: Skilled Nursing Facility | Payer: Medicare Other | Source: Home / Self Care | Attending: Internal Medicine | Admitting: Internal Medicine

## 2022-02-01 DIAGNOSIS — I129 Hypertensive chronic kidney disease with stage 1 through stage 4 chronic kidney disease, or unspecified chronic kidney disease: Secondary | ICD-10-CM | POA: Diagnosis present

## 2022-02-01 DIAGNOSIS — R55 Syncope and collapse: Secondary | ICD-10-CM | POA: Diagnosis present

## 2022-02-01 DIAGNOSIS — F17210 Nicotine dependence, cigarettes, uncomplicated: Secondary | ICD-10-CM | POA: Diagnosis present

## 2022-02-01 DIAGNOSIS — N2889 Other specified disorders of kidney and ureter: Secondary | ICD-10-CM | POA: Diagnosis present

## 2022-02-01 DIAGNOSIS — I951 Orthostatic hypotension: Secondary | ICD-10-CM | POA: Diagnosis present

## 2022-02-01 DIAGNOSIS — Z79899 Other long term (current) drug therapy: Secondary | ICD-10-CM

## 2022-02-01 DIAGNOSIS — Z880 Allergy status to penicillin: Secondary | ICD-10-CM

## 2022-02-01 DIAGNOSIS — D631 Anemia in chronic kidney disease: Secondary | ICD-10-CM | POA: Diagnosis present

## 2022-02-01 DIAGNOSIS — Z950 Presence of cardiac pacemaker: Secondary | ICD-10-CM

## 2022-02-01 DIAGNOSIS — E340 Carcinoid syndrome: Secondary | ICD-10-CM | POA: Diagnosis present

## 2022-02-01 DIAGNOSIS — Z86711 Personal history of pulmonary embolism: Secondary | ICD-10-CM

## 2022-02-01 DIAGNOSIS — K529 Noninfective gastroenteritis and colitis, unspecified: Secondary | ICD-10-CM | POA: Diagnosis present

## 2022-02-01 DIAGNOSIS — Z888 Allergy status to other drugs, medicaments and biological substances status: Secondary | ICD-10-CM

## 2022-02-01 DIAGNOSIS — Z7982 Long term (current) use of aspirin: Secondary | ICD-10-CM

## 2022-02-01 DIAGNOSIS — I495 Sick sinus syndrome: Secondary | ICD-10-CM | POA: Diagnosis present

## 2022-02-01 DIAGNOSIS — N184 Chronic kidney disease, stage 4 (severe): Secondary | ICD-10-CM | POA: Diagnosis present

## 2022-02-01 DIAGNOSIS — J45909 Unspecified asthma, uncomplicated: Secondary | ICD-10-CM | POA: Diagnosis present

## 2022-02-01 DIAGNOSIS — E43 Unspecified severe protein-calorie malnutrition: Secondary | ICD-10-CM | POA: Diagnosis present

## 2022-02-01 DIAGNOSIS — C7A019 Malignant carcinoid tumor of the small intestine, unspecified portion: Secondary | ICD-10-CM | POA: Diagnosis present

## 2022-02-01 DIAGNOSIS — E876 Hypokalemia: Secondary | ICD-10-CM | POA: Diagnosis present

## 2022-02-01 DIAGNOSIS — Z681 Body mass index (BMI) 19 or less, adult: Secondary | ICD-10-CM

## 2022-02-01 DIAGNOSIS — D649 Anemia, unspecified: Secondary | ICD-10-CM

## 2022-02-01 DIAGNOSIS — I480 Paroxysmal atrial fibrillation: Secondary | ICD-10-CM | POA: Diagnosis present

## 2022-02-01 DIAGNOSIS — Z7951 Long term (current) use of inhaled steroids: Secondary | ICD-10-CM

## 2022-02-01 LAB — CBC WITH DIFFERENTIAL/PLATELET
Abs Immature Granulocytes: 0.03 10*3/uL (ref 0.00–0.07)
Basophils Absolute: 0 10*3/uL (ref 0.0–0.1)
Basophils Relative: 0 %
Eosinophils Absolute: 0 10*3/uL (ref 0.0–0.5)
Eosinophils Relative: 0 %
HCT: 26 % — ABNORMAL LOW (ref 39.0–52.0)
Hemoglobin: 9.2 g/dL — ABNORMAL LOW (ref 13.0–17.0)
Immature Granulocytes: 0 %
Lymphocytes Relative: 6 %
Lymphs Abs: 0.5 10*3/uL — ABNORMAL LOW (ref 0.7–4.0)
MCH: 31.1 pg (ref 26.0–34.0)
MCHC: 35.4 g/dL (ref 30.0–36.0)
MCV: 87.8 fL (ref 80.0–100.0)
Monocytes Absolute: 0.7 10*3/uL (ref 0.1–1.0)
Monocytes Relative: 8 %
Neutro Abs: 7.8 10*3/uL — ABNORMAL HIGH (ref 1.7–7.7)
Neutrophils Relative %: 86 %
Platelets: 151 10*3/uL (ref 150–400)
RBC: 2.96 MIL/uL — ABNORMAL LOW (ref 4.22–5.81)
RDW: 13.8 % (ref 11.5–15.5)
WBC: 9.2 10*3/uL (ref 4.0–10.5)
nRBC: 0 % (ref 0.0–0.2)

## 2022-02-01 LAB — COMPREHENSIVE METABOLIC PANEL
ALT: 12 U/L (ref 0–44)
AST: 17 U/L (ref 15–41)
Albumin: 3.6 g/dL (ref 3.5–5.0)
Alkaline Phosphatase: 44 U/L (ref 38–126)
Anion gap: 7 (ref 5–15)
BUN: 42 mg/dL — ABNORMAL HIGH (ref 8–23)
CO2: 23 mmol/L (ref 22–32)
Calcium: 8.6 mg/dL — ABNORMAL LOW (ref 8.9–10.3)
Chloride: 115 mmol/L — ABNORMAL HIGH (ref 98–111)
Creatinine, Ser: 3.18 mg/dL — ABNORMAL HIGH (ref 0.61–1.24)
GFR, Estimated: 19 mL/min — ABNORMAL LOW (ref >60)
Glucose, Bld: 131 mg/dL — ABNORMAL HIGH (ref 70–99)
Potassium: 3.5 mmol/L (ref 3.5–5.1)
Sodium: 145 mmol/L (ref 135–145)
Total Bilirubin: 0.7 mg/dL (ref 0.3–1.2)
Total Protein: 6.3 g/dL — ABNORMAL LOW (ref 6.5–8.1)

## 2022-02-01 LAB — CBC
HCT: 24.8 % — ABNORMAL LOW (ref 39.0–52.0)
Hemoglobin: 8.8 g/dL — ABNORMAL LOW (ref 13.0–17.0)
MCH: 31.2 pg (ref 26.0–34.0)
MCHC: 35.5 g/dL (ref 30.0–36.0)
MCV: 87.9 fL (ref 80.0–100.0)
Platelets: 142 10*3/uL — ABNORMAL LOW (ref 150–400)
RBC: 2.82 MIL/uL — ABNORMAL LOW (ref 4.22–5.81)
RDW: 13.9 % (ref 11.5–15.5)
WBC: 9 10*3/uL (ref 4.0–10.5)
nRBC: 0 % (ref 0.0–0.2)

## 2022-02-01 LAB — D-DIMER, QUANTITATIVE: D-Dimer, Quant: 1.47 ug/mL-FEU — ABNORMAL HIGH (ref 0.00–0.50)

## 2022-02-01 LAB — URINALYSIS, ROUTINE W REFLEX MICROSCOPIC
Bacteria, UA: NONE SEEN
Bilirubin Urine: NEGATIVE
Glucose, UA: NEGATIVE mg/dL
Hgb urine dipstick: NEGATIVE
Ketones, ur: NEGATIVE mg/dL
Leukocytes,Ua: NEGATIVE
Nitrite: NEGATIVE
Protein, ur: 100 mg/dL — AB
Specific Gravity, Urine: 1.011 (ref 1.005–1.030)
pH: 5 (ref 5.0–8.0)

## 2022-02-01 LAB — CREATININE, SERUM
Creatinine, Ser: 3.02 mg/dL — ABNORMAL HIGH (ref 0.61–1.24)
GFR, Estimated: 20 mL/min — ABNORMAL LOW (ref >60)

## 2022-02-01 LAB — MAGNESIUM: Magnesium: 1.5 mg/dL — ABNORMAL LOW (ref 1.7–2.4)

## 2022-02-01 LAB — CBG MONITORING, ED: Glucose-Capillary: 101 mg/dL — ABNORMAL HIGH (ref 70–99)

## 2022-02-01 LAB — TROPONIN I (HIGH SENSITIVITY)
Troponin I (High Sensitivity): 28 ng/L — ABNORMAL HIGH (ref <18)
Troponin I (High Sensitivity): 24 ng/L — ABNORMAL HIGH (ref <18)
Troponin I (High Sensitivity): 32 ng/L — ABNORMAL HIGH (ref <18)
Troponin I (High Sensitivity): 28 ng/L — ABNORMAL HIGH (ref <18)

## 2022-02-01 MED ORDER — ALBUTEROL SULFATE (2.5 MG/3ML) 0.083% IN NEBU
2.5000 mg | INHALATION_SOLUTION | Freq: Four times a day (QID) | RESPIRATORY_TRACT | Status: DC | PRN
Start: 2022-02-01 — End: 2022-02-01

## 2022-02-01 MED ORDER — ALBUTEROL SULFATE (2.5 MG/3ML) 0.083% IN NEBU: 2.5000 mg | INHALATION_SOLUTION | RESPIRATORY_TRACT | Status: DC | PRN

## 2022-02-01 MED ORDER — SODIUM CHLORIDE 0.9 % IV SOLN: INTRAVENOUS | Status: AC

## 2022-02-01 MED ORDER — MIDODRINE HCL 5 MG PO TABS
15.0000 mg | ORAL_TABLET | Freq: Three times a day (TID) | ORAL | Status: DC
  Administered 2022-02-02 – 2022-02-06 (×10): 15 mg via ORAL
  Filled 2022-02-01 (×10): qty 3

## 2022-02-01 MED ORDER — HYDROXYZINE HCL 25 MG PO TABS
25.0000 mg | ORAL_TABLET | Freq: Once | ORAL | Status: AC
  Administered 2022-02-01: 25 mg via ORAL
  Filled 2022-02-01: qty 1

## 2022-02-01 MED ORDER — ASPIRIN 81 MG PO TBEC
81.0000 mg | DELAYED_RELEASE_TABLET | Freq: Every day | ORAL | Status: DC
  Administered 2022-02-02 – 2022-02-09 (×8): 81 mg via ORAL
  Filled 2022-02-01 (×8): qty 1

## 2022-02-01 MED ORDER — ONDANSETRON HCL 4 MG PO TABS: 4.0000 mg | ORAL_TABLET | Freq: Four times a day (QID) | ORAL | Status: DC | PRN

## 2022-02-01 MED ORDER — FLUDROCORTISONE ACETATE 0.1 MG PO TABS
0.1000 mg | ORAL_TABLET | Freq: Every day | ORAL | Status: DC
  Administered 2022-02-02 – 2022-02-09 (×8): 0.1 mg via ORAL
  Filled 2022-02-01 (×9): qty 1

## 2022-02-01 MED ORDER — HEPARIN SODIUM (PORCINE) 5000 UNIT/ML IJ SOLN
5000.0000 [IU] | Freq: Three times a day (TID) | INTRAMUSCULAR | Status: DC
  Administered 2022-02-01 – 2022-02-09 (×23): 5000 [IU] via SUBCUTANEOUS
  Filled 2022-02-01 (×23): qty 1

## 2022-02-01 MED ORDER — ACETAMINOPHEN 650 MG RE SUPP: 650.0000 mg | Freq: Four times a day (QID) | RECTAL | Status: DC | PRN

## 2022-02-01 MED ORDER — DIPHENOXYLATE-ATROPINE 2.5-0.025 MG PO TABS
1.0000 | ORAL_TABLET | Freq: Two times a day (BID) | ORAL | Status: DC | PRN
  Administered 2022-02-07 – 2022-02-08 (×2): 1 via ORAL
  Filled 2022-02-01 (×2): qty 1

## 2022-02-01 MED ORDER — SODIUM CHLORIDE 0.9 % IV BOLUS
500.0000 mL | Freq: Once | INTRAVENOUS | Status: AC
  Administered 2022-02-01: 500 mL via INTRAVENOUS

## 2022-02-01 MED ORDER — MOMETASONE FURO-FORMOTEROL FUM 200-5 MCG/ACT IN AERO
2.0000 | INHALATION_SPRAY | Freq: Two times a day (BID) | RESPIRATORY_TRACT | Status: DC
  Administered 2022-02-02 – 2022-02-09 (×13): 2 via RESPIRATORY_TRACT
  Filled 2022-02-01 (×2): qty 8.8

## 2022-02-01 MED ORDER — ACETAMINOPHEN 325 MG PO TABS
650.0000 mg | ORAL_TABLET | Freq: Four times a day (QID) | ORAL | Status: DC | PRN
  Administered 2022-02-08: 650 mg via ORAL
  Filled 2022-02-01 (×2): qty 2

## 2022-02-01 MED ORDER — ONDANSETRON HCL 4 MG/2ML IJ SOLN: 4.0000 mg | Freq: Four times a day (QID) | INTRAMUSCULAR | Status: DC | PRN

## 2022-02-01 NOTE — Progress Notes (Signed)
No further syncope episode. Patient with no dyspnea or chest pain.   BP (!) 187/114 (BP Location: Left Arm)   Pulse 87   Temp 97.9 F (36.6 C) (Oral)   Resp 18   Ht '6\' 1"'$  (1.854 m)   Wt 60.5 kg   SpO2 99%   BMI 17.60 kg/m   Neurology awake and alert ENT with no pallor Cardiovascular with S1 and S2 present and rhythmic Respiratory with no rales or wheezing Abdomen not distended No lower extremity edema.   Orthostatic hypotension.  Patient will be discharge home with instructions to follow up with primary care, nephrology and cardiology as outpatient.  Check blood pressure before each dose of midodrine and if systolic 746 or greater to hold the dose.  Patient not able to take antihypertensive medications due to severe orthostatic hypotension and syncope.

## 2022-02-01 NOTE — H&P (Signed)
History and Physical    Jacquel Mccamish Royer VOJ:[id number] DOB: Dec 26, 1940 DOA: 02/01/2022  PCP: [name], MD  Patient coming from: home  I have personally briefly reviewed patient's old medical records in [place]  Chief Complaint: recurrent syncope   HPI: Sacha Radloff Peckman is a 81 y.o. male with medical history significant of  intestinal carcinoid, chronic diarrhea, CKD stage 3, anemia, paroxysmal atrial fibrillation, pulmonary embolism, tachy brady syndrome sp pacemaker, orthostatic hypotension, who was recently discharged today after  hospital stay 8/3-8/6  for recurrent syncope above his baseline due to progressive orthostatic hypotension. Patient was treated with ivfs with improvement and per wife only had one episode of syncope while in house which is close to his baseline. He was discharged on midodrine and florinef.  Patient states day of discharge he did not feel well. He describes feeling as feeling heavy. He states he did not mention this because he wanted to go home. However at home he continue to feel unwell and had two syncopal episodes with bp 60 systolic in the field per notes.  Patient currently states he has been complaint with his medications, he notes he did have episode of loose stools and note he did not eat well. He notes no chest pain , sob,  n/v/. He does endorse weight loss.    ED Course:  Vitals: afeb, bp163/87(170/121),  hr 81, rr 16 sat 96% on ra EKG: Sinus rhythm, LVH nonspecific twave abn  Ce28,24 (prior high 124) NA:145, K 3.5, CL115, Glucose131, cr 3.18 trending toward baseline of 2.68 UA neg Mag 1.5  Review of Systems: As per HPI otherwise 10 point review of systems negative.   Past Medical History:  Diagnosis Date   Asthma    Carcinoid tumor, small intestine, malignant (HCC)    Chronic diarrhea    CKD (chronic kidney disease) stage 3, GFR 30-59 ml/min (HCC)    HTN (hypertension)    Short gut syndrome     Past Surgical History:  Procedure  Laterality Date   COLON SURGERY     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   Procedure: PACEMAKER IMPLANT;  Surgeon: Constance Haw, MD;  Location: [facility];  Service: Cardiovascular;  Laterality: N/A;     reports that he has been smoking cigars and cigarettes. He started smoking about 63 years ago. He has a 18.30 pack-year smoking history. He has never used smokeless tobacco. He reports that he does not currently use alcohol. He reports that he does not use drugs.  Allergies  Allergen Reactions   Penicillins Hives and Shortness Of Breath   Lisinopril Itching    Family History  Problem Relation Age of Onset   Alzheimer's disease Mother     Prior to Admission medications   Medication Sig Start Date End Date Taking? Authorizing Provider  albuterol (VENTOLIN HFA) 108 (90 Base) MCG/ACT inhaler Inhale 2 puffs into the lungs every 6 (six) hours as needed for wheezing or shortness of breath. 01/28/22   [name], MD  aspirin 81 MG EC tablet Take 81 mg by mouth daily. Swallow whole.    [provider]  diphenoxylate-atropine (LOMOTIL) 2.5-0.025 MG tablet Take 1 tablet by mouth 2 (two) times daily as needed for diarrhea or loose stools. 11/26/21   Brunetta Genera, MD  feeding supplement, ENSURE COMPLETE, (ENSURE COMPLETE) LIQD Take 237 mLs by mouth 2 (two) times daily between meals.    [provider]  fludrocortisone (FLORINEF) 0.1 MG tablet  Take 1 tablet (0.1 mg total) by mouth daily. 05/15/21   Jerline Pain, MD  iron polysaccharides (NIFEREX) 150 MG capsule TAKE 1 CAPSULE(150 MG) BY MOUTH 3 TIMES A WEEK Patient taking differently: Take 150 mg by mouth 3 (three) times a week. 09/22/21   Brunetta Genera, MD  LANREOTIDE ACETATE [place] Inject 1 Dose into the skin every 30 (thirty) days.    [provider]  loperamide (IMODIUM A-D) 2 MG capsule Take 2 mg by mouth daily as needed for diarrhea or loose stools.    [provider]  midodrine  (PROAMATINE) 5 MG tablet Take 3 tablets (15 mg total) by mouth 3 (three) times daily with meals. 11/10/21   Biagio Borg, MD  mirtazapine (REMERON) 30 MG tablet Take 1 tablet (30 mg total) by mouth daily. 01/12/22 01/07/23  Reed, Tiffany L, DO  Multiple Vitamins-Minerals (PRESERVISION AREDS 2 PO) Take 1 capsule by mouth daily.    [provider]  multivitamin (ONE-A-DAY MEN'S) TABS tablet Take 1 tablet by mouth daily.    [provider]  Potassium Chloride ER 20 MEQ TBCR Take 1 tablet by mouth 2 (two) times daily. 11/10/21   Biagio Borg, MD  SYMBICORT 160-4.5 MCG/ACT inhaler Inhale 2 puffs into the lungs daily as needed (wheezing). 01/28/22   [name], MD  thiamine (VITAMIN B-1) 50 MG tablet Take 1 tablet (50 mg total) by mouth daily. 12/10/21   [name], MD    Physical Exam: Vitals:   02/01/22 1845 02/01/22 1900 02/01/22 1910 02/01/22 2000  BP: (!) 172/88 (!) 169/88 (!) 167/89 (!) 188/94  Pulse: 71 69 67 74  Resp: (!) 21 17 (!) 22 14  Temp:      TempSrc:      SpO2: 96% 100% 99% 98%     Vitals:   02/01/22 1845 02/01/22 1900 02/01/22 1910 02/01/22 2000  BP: (!) 172/88 (!) 169/88 (!) 167/89 (!) 188/94  Pulse: 71 69 67 74  Resp: (!) 21 17 (!) 22 14  Temp:      TempSrc:      SpO2: 96% 100% 99% 98%  Constitutional: NAD, calm, comfortable,cachetic Eyes: PERRL, lids and conjunctivae normal ENMT: Mucous membranes are dry Posterior pharynx clear of any exudate or lesions.Normal dentition.  Neck: normal, supple, no masses, no thyromegaly Respiratory: clear to auscultation bilaterally, no wheezing, no crackles. Normal respiratory effort. No accessory muscle use.  Cardiovascular: Regular rate and rhythm, no murmurs / rubs / gallops. No extremity edema. 2+ pedal pulses. Abdomen: no tenderness, no masses palpated. No hepatosplenomegaly. Bowel sounds positive.  Musculoskeletal: no clubbing / cyanosis. No joint deformity upper and lower extremities. Good ROM, no  contractures. Normal muscle tone.  Skin: no rashes, lesions, ulcers. No induration Neurologic: CN 2-12 grossly intact. Sensation intact,  Strength 4/5 in all 4.  Psychiatric: Normal judgment and insight. Alert and oriented x 3. Normal mood.    Labs on Admission: I have personally reviewed following labs and imaging studies  CBC: Recent Labs  Lab 01/27/22 1510 01/29/22 1659 01/29/22 2223 01/30/22 0256 01/31/22 0348 02/01/22 1748  WBC 7.0 7.4 6.4 7.2 8.9 9.2  NEUTROABS 4.7 5.9  --   --   --  7.8*  HGB 10.0* 9.5* 9.2* 9.3* 9.3* 9.2*  HCT 28.6* 27.1* 25.6* 26.2* 27.2* 26.0*  MCV 87.5 87.7 87.1 87.0 88.3 87.8  PLT 196 185 160 163 153 856   Basic Metabolic Panel: Recent Labs  Lab 01/27/22 1510  01/29/22 1659 01/29/22 2223 01/30/22 0256 01/31/22 0348 02/01/22 1748  NA 141 138  --  141 140 145  K 4.4 4.1  --  4.3 3.9 3.5  CL 108 106  --  111 110 115*  CO2 25 20*  --  21* 21* 23  GLUCOSE 112* 128*  --  104* 107* 131*  BUN 49* 50*  --  47* 45* 42*  CREATININE 3.68* 3.83* 3.52* 3.37* 3.23* 3.18*  CALCIUM 9.3 9.2  --  8.9 8.7* 8.6*   GFR: Estimated Creatinine Clearance: 15.9 mL/min (A) (by C-G formula based on SCr of 3.18 mg/dL (H)). Liver Function Tests: Recent Labs  Lab 01/27/22 1510 01/29/22 1659 02/01/22 1748  AST 14* 18 17  ALT '13 16 12  '$ ALKPHOS 48 50 44  BILITOT 0.6 0.7 0.7  PROT 7.3 6.6 6.3*  ALBUMIN 4.2 3.8 3.6   Recent Labs  Lab 01/29/22 1659  LIPASE 36   No results for input(s): "AMMONIA" in the last 168 hours. Coagulation Profile: No results for input(s): "INR", "PROTIME" in the last 168 hours. Cardiac Enzymes: No results for input(s): "CKTOTAL", "CKMB", "CKMBINDEX", "TROPONINI" in the last 168 hours. BNP (last 3 results) No results for input(s): "PROBNP" in the last 8760 hours. HbA1C: No results for input(s): "HGBA1C" in the last 72 hours. CBG: Recent Labs  Lab 01/31/22 1112  GLUCAP 102*   Lipid Profile: No results for input(s): "CHOL",  "HDL", "LDLCALC", "TRIG", "CHOLHDL", "LDLDIRECT" in the last 72 hours. Thyroid Function Tests: No results for input(s): "TSH", "T4TOTAL", "FREET4", "T3FREE", "THYROIDAB" in the last 72 hours. Anemia Panel: No results for input(s): "VITAMINB12", "FOLATE", "FERRITIN", "TIBC", "IRON", "RETICCTPCT" in the last 72 hours. Urine analysis:    Component Value Date/Time   COLORURINE YELLOW 02/01/2022 1912   APPEARANCEUR HAZY (A) 02/01/2022 1912   LABSPEC 1.011 02/01/2022 1912   PHURINE 5.0 02/01/2022 1912   GLUCOSEU NEGATIVE 02/01/2022 1912   HGBUR NEGATIVE 02/01/2022 1912   BILIRUBINUR NEGATIVE 02/01/2022 1912   KETONESUR NEGATIVE 02/01/2022 1912   PROTEINUR 100 (A) 02/01/2022 1912   UROBILINOGEN 0.2 08/23/2012 2225   NITRITE NEGATIVE 02/01/2022 1912   LEUKOCYTESUR NEGATIVE 02/01/2022 1912    Radiological Exams on Admission: No results found.  EKG: Independently reviewed. See above  Assessment/Plan Recurrent Syncope due to Orthostatic hypotension -recurrent event despite use of midodrine and florinef - continue midodrine and florinef  -permissive htn to allow for high be with positional change  -thigh high ted stockings  -ivfs to ensure euvolemia in setting of diarrhea related to carcinoid tumor  - PT/OT eval rehab needs  -vague complaint of feeling unwell, noted electrolyte abnormalities  no new symptoms.   Hypomagnesemia Hypokalemia -replete prn   Recent AKI on CKDIIIa -resolving , cr trending toward baseline - followed by renal   Afib  -currently in sinus -not AC due to hx of gi bleed/ syncope events  PE, hx of  -s/p tx with AC -+ d-dimer  -doppler pending  -V/Q scan    Left renal mass  -? 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

## 2022-02-01 NOTE — Progress Notes (Signed)
Patient and spouse given discharge instructions and stated understanding.

## 2022-02-01 NOTE — ED Notes (Signed)
Patient cleaned and changed into new brief with RN d/t patient having a bowel movement. Pt is now on cardiac monitor.

## 2022-02-01 NOTE — ED Notes (Signed)
[employer] called for interrogation.

## 2022-02-01 NOTE — ED Notes (Signed)
Adam with [employer] called questioning about the interrogation as the pt had one done a few days ago and his home device monitor sent in a normal report this morning. Adam spoke with PA who approved the report from this morning with the rep coming in the morning at 7am to interrogate pacemaker.

## 2022-02-01 NOTE — Plan of Care (Signed)
  Problem: Education: Goal: Knowledge of General Education information will improve Description: Including pain rating scale, medication(s)/side effects and non-pharmacologic comfort measures Outcome: Adequate for Discharge   Problem: Health Behavior/Discharge Planning: Goal: Ability to manage health-related needs will improve Outcome: Adequate for Discharge   Problem: Clinical Measurements: Goal: Ability to maintain clinical measurements within normal limits will improve Outcome: Adequate for Discharge Goal: Will remain free from infection Outcome: Adequate for Discharge Goal: Diagnostic test results will improve Outcome: Adequate for Discharge Goal: Respiratory complications will improve Outcome: Adequate for Discharge Goal: Cardiovascular complication will be avoided Outcome: Adequate for Discharge   Problem: Activity: Goal: Risk for activity intolerance will decrease Outcome: Adequate for Discharge   Problem: Nutrition: Goal: Adequate nutrition will be maintained Outcome: Adequate for Discharge   Problem: Coping: Goal: Level of anxiety will decrease Outcome: Adequate for Discharge   Problem: Elimination: Goal: Will not experience complications related to bowel motility Outcome: Adequate for Discharge Goal: Will not experience complications related to urinary retention Outcome: Adequate for Discharge   Problem: Pain Managment: Goal: General experience of comfort will improve Outcome: Adequate for Discharge   Problem: Safety: Goal: Ability to remain free from injury will improve Outcome: Adequate for Discharge   Problem: Skin Integrity: Goal: Risk for impaired skin integrity will decrease Outcome: Adequate for Discharge   Problem: Acute Rehab PT Goals(only PT should resolve) Goal: Patient Will Transfer Sit To/From Stand Outcome: Adequate for Discharge Goal: Pt Will Ambulate Outcome: Adequate for Discharge Goal: Pt Will Go Up/Down Stairs Outcome: Adequate for  Discharge

## 2022-02-01 NOTE — Progress Notes (Signed)
[facility] Progress Note   81 y.o. 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 Irene Limbo, anemia, severe malnutrition, CKD III with a baseline creatinine of ~2.6 presenting with syncopal episode. He has a history of orthostatic hypotension but it seems to have increased in frequency. Cr also increased to 3.6 but at time of consultation the Cr had already decreased to 3.37.  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 Increased frequency of syncopal episodes may be a function of increased and continued GI losses + admitted poor oral intake. -  No need to repeat kidney ultrasound as unlikely to be obstructive. - Encouraged increased oral intake + hydration; easier said than done with a poor appetite despite Mirtazapine and Marinol.  - Renal function already improving (no labs this am but anticipate improvement) and can counseled him about waiting after changing positions, ensuring no dizziness before moving.   - Very difficult situation; unfortunately syncopal episode in bathroom with + hypotension. Amlodipine and hydralazine stopped but he has hypertension when supine but orthostasis with change in position.   Fortunately renal function had been slowly improving but no labs today.  Signing off at this time; please reconsult as needed. Needs f/u with [facility] regular appt (will have office call him on Monday)     Orthostatic hypotension - Currently on midodrine and Florinef. Should f/u cards upon d/c. 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 Following with oncologist Dr. Irene Limbo on lanreonide. Hypertension -  Blood pressure control suboptimal but he has severe orthostatic hypotension. He is actually on midodrine and Florinef. Probably should follow up  with cardiologist. Hasn't seen cards in a while. Malnutrition - Currently on appetite stimulants. Encourage oral intake including protein. He has significant muscle loss.  Subjective:   Syncopal episode after using restroom yesterday; therefore d/c on hold. He's feeling better now and keeps asking about going home.   Objective:   BP (!) 187/114 (BP Location: Left Arm)   Pulse 87   Temp 97.9 F (36.6 C) (Oral)   Resp 18   Ht '6\' 1"'$  (1.854 m)   Wt 60.5 kg   SpO2 99%   BMI 17.60 kg/m   Intake/Output Summary (Last 24 hours) at 02/01/2022 0818 Last data filed at 02/01/2022 0272 Gross per 24 hour  Intake 1952.42 ml  Output 900 ml  Net 1052.42 ml   Weight change:   Physical Exam: General: Cachectic thin male HEENT: no conj pallor Respiration: Clear to ausculate bilateral Cardiovascular: RRR Gastrointestinal: BS+. abdomen soft, NT, ND. Genitourinary: No CVA tenderness. Neurology: alert, awake. No focal deficit noticed.  Imaging: No results found.  Labs: BMET Recent Labs  Lab 01/27/22 1510 01/29/22 1659 01/29/22 2223 01/30/22 0256 01/31/22 0348  NA 141 138  --  141 140  K 4.4 4.1  --  4.3 3.9  CL 108 106  --  111 110  CO2 25 20*  --  21* 21*  GLUCOSE 112* 128*  --  104* 107*  BUN 49* 50*  --  47* 45*  CREATININE 3.68* 3.83* 3.52* 3.37* 3.23*  CALCIUM 9.3 9.2  --  8.9 8.7*   CBC Recent Labs  Lab 01/27/22 1510 01/29/22  1659 01/29/22 2223 01/30/22 0256 01/31/22 0348  WBC 7.0 7.4 6.4 7.2 8.9  NEUTROABS 4.7 5.9  --   --   --   HGB 10.0* 9.5* 9.2* 9.3* 9.3*  HCT 28.6* 27.1* 25.6* 26.2* 27.2*  MCV 87.5 87.7 87.1 87.0 88.3  PLT 196 185 160 163 153    Medications:     aspirin EC  81 mg Oral Daily   heparin  5,000 Units Subcutaneous Q8H   iron polysaccharides  150 mg Oral Once per day on Mon Wed Fri   mirtazapine  30 mg  Oral QHS   mometasone-formoterol  2 puff Inhalation BID      Otelia Santee, MD 02/01/2022, 8:18 AM

## 2022-02-01 NOTE — Progress Notes (Signed)
DISCHARGE NOTE HOME Alan [name] to be discharged Home per MD order. Discussed prescriptions and follow up appointments with the patient. Prescriptions given to patient; medication list explained in detail. Patient verbalized understanding.  Skin clean, dry and intact without evidence of skin break down, no evidence of skin tears noted. IV catheter discontinued intact. Site without signs and symptoms of complications. Dressing and pressure applied. Pt denies pain at the site currently. No complaints noted.  Patient free of lines, drains, and wounds.   An After Visit Summary (AVS) was printed and given to the patient. Patient escorted via wheelchair, and discharged home via private auto.  Berneta Levins, RN

## 2022-02-01 NOTE — ED Triage Notes (Addendum)
Pt d/c'd this morning for the hospital. Seen for multiple syncopal episode. Called ems today d/t went to the bathroom and when he stood up and passed out. He didn't wanted to come the hospital. Ems did orthostatics and when he stood up he has a bp of 60 palp and had another syncopal episode.

## 2022-02-01 NOTE — ED Provider Notes (Signed)
[facility] EMERGENCY DEPARTMENT Provider Note   CSN: [id number] Arrival date & time: 02/01/22  1646     History  Chief Complaint  Patient presents with   Loss of Consciousness    Alan [name] is a 81 y.o. male.    81 year old male with PMH of malignant carcinoid tumor of intestine, PE, paroxysmal A-fib, GI bleed not currently on anticoagulation, hypertension, CKD, tachybradycardia syndrome status post PPM presents to the emergency room for syncopal episode.  Patient has long standing history of orthostasis and states at baseline he typically has a few episodes of vasovagal syncope per month.  He states over the past 2 weeks he has been having multiple episodes per day which is unusual for him.  He was admitted on 8/3 and just discharged this morning.  Throughout his stay in the hospital he did not have any episodes of syncope.  Patient states since discharge he has taken dose of his midodrine as well as fludrocortisone.  Patient attempted to stand twice and with both attempts he passed out.  He reports lightheadedness prior to passing out.  Denies chest pain, palpitations.  He does state today compared to yesterday he does not feel well overall.  States he was tired of being in the hospital so he did not report this prior to discharge so he could be discharged today.  Both falls were witnessed.  No report of head injury.  The history is provided by the patient. No language interpreter was used.       Home Medications Prior to Admission medications   Medication Sig Start Date End Date Taking? Authorizing Provider  albuterol (VENTOLIN HFA) 108 (90 Base) MCG/ACT inhaler Inhale 2 puffs into the lungs every 6 (six) hours as needed for wheezing or shortness of breath. 01/28/22   [name], MD  aspirin 81 MG EC tablet Take 81 mg by mouth daily. Swallow whole.    [provider]  diphenoxylate-atropine (LOMOTIL) 2.5-0.025 MG tablet Take 1 tablet by mouth 2  (two) times daily as needed for diarrhea or loose stools. 11/26/21   Brunetta Genera, MD  feeding supplement, ENSURE COMPLETE, (ENSURE COMPLETE) LIQD Take 237 mLs by mouth 2 (two) times daily between meals.    [provider]  fludrocortisone (FLORINEF) 0.1 MG tablet Take 1 tablet (0.1 mg total) by mouth daily. 05/15/21   Jerline Pain, MD  iron polysaccharides (NIFEREX) 150 MG capsule TAKE 1 CAPSULE(150 MG) BY MOUTH 3 TIMES A WEEK Patient taking differently: Take 150 mg by mouth 3 (three) times a week. 09/22/21   Brunetta Genera, MD  LANREOTIDE ACETATE  Inject 1 Dose into the skin every 30 (thirty) days.    [provider]  loperamide (IMODIUM A-D) 2 MG capsule Take 2 mg by mouth daily as needed for diarrhea or loose stools.    [provider]  midodrine (PROAMATINE) 5 MG tablet Take 3 tablets (15 mg total) by mouth 3 (three) times daily with meals. 11/10/21   Biagio Borg, MD  mirtazapine (REMERON) 30 MG tablet Take 1 tablet (30 mg total) by mouth daily. 01/12/22 01/07/23  Reed, Tiffany L, DO  Multiple Vitamins-Minerals (PRESERVISION AREDS 2 PO) Take 1 capsule by mouth daily.    [provider]  multivitamin (ONE-A-DAY MEN'S) TABS tablet Take 1 tablet by mouth daily.    [provider]  Potassium Chloride ER 20 MEQ TBCR Take 1 tablet by mouth 2 (two) times daily. 11/10/21  Biagio Borg, MD  SYMBICORT 160-4.5 MCG/ACT inhaler Inhale 2 puffs into the lungs daily as needed (wheezing). 01/28/22   [name], MD  thiamine (VITAMIN B-1) 50 MG tablet Take 1 tablet (50 mg total) by mouth daily. 12/10/21   [name], MD      Allergies    Penicillins and Lisinopril    Review of Systems   Review of Systems  Constitutional:  Negative for chills and fever.  Respiratory:  Negative for shortness of breath.   Cardiovascular:  Negative for chest pain, palpitations and leg swelling.  Gastrointestinal:  Negative for abdominal pain, nausea and  vomiting.  Neurological:  Positive for syncope and light-headedness.  All other systems reviewed and are negative.   Physical Exam Updated Vital Signs BP (!) 163/87 (BP Location: Right Arm)   Pulse 81   Temp (!) 97.5 F (36.4 C) (Oral)   Resp 16   SpO2 96%  Physical Exam Vitals and nursing note reviewed.  Constitutional:      General: He is not in acute distress.    Appearance: Normal appearance. He is not ill-appearing.  HENT:     Head: Normocephalic and atraumatic.     Nose: Nose normal.  Eyes:     General: No scleral icterus.    Extraocular Movements: Extraocular movements intact.     Conjunctiva/sclera: Conjunctivae normal.  Cardiovascular:     Rate and Rhythm: Normal rate and regular rhythm.     Pulses: Normal pulses.  Pulmonary:     Effort: Pulmonary effort is normal. No respiratory distress.     Breath sounds: Normal breath sounds. No wheezing or rales.  Abdominal:     General: There is no distension.     Tenderness: There is no abdominal tenderness.  Musculoskeletal:        General: Normal range of motion.     Cervical back: Normal range of motion.     Right lower leg: No edema.     Left lower leg: No edema.  Skin:    General: Skin is warm and dry.  Neurological:     General: No focal deficit present.     Mental Status: He is alert. Mental status is at baseline.     ED Results / Procedures / Treatments   Labs (all labs ordered are listed, but only abnormal results are displayed) Labs Reviewed  CBC WITH DIFFERENTIAL/PLATELET - Abnormal; Notable for the following components:      Result Value   RBC 2.96 (*)    Hemoglobin 9.2 (*)    HCT 26.0 (*)    Neutro Abs 7.8 (*)    Lymphs Abs 0.5 (*)    All other components within normal limits  COMPREHENSIVE METABOLIC PANEL  URINALYSIS, ROUTINE W REFLEX MICROSCOPIC  TROPONIN I (HIGH SENSITIVITY)    EKG None  Radiology No results found.  Procedures Procedures    Medications Ordered in ED Medications  - No data to display  ED Course/ Medical Decision Making/ A&P Clinical Course as of 02/01/22 1915  Sun Feb 01, 2022  1856 Patient with history of orthostatic hypotension recently discharged for similar back today after syncopal episodes x2.  He is remaining orthostatic despite being on midodrine and steroids.  Plan to give patient IV fluids and admit for high risk syncope requiring further medication control and fluids.  His blood work is at baseline.  Hemoglobin 9.2 at baseline, troponin 28 consistent with previous, no EKG changes, creatinine 3.18 at baseline. [VB]  Clinical Course User Index [VB] Elgie Congo, MD                           Medical Decision Making Amount and/or Complexity of Data Reviewed Labs: ordered.  Risk Decision regarding hospitalization.   Medical Decision Making / ED Course   This patient presents to the ED for concern of syncopal episode, this involves an extensive number of treatment options, and is a complaint that carries with it a high risk of complications and morbidity.  The differential diagnosis includes orthostasis, arrhythmia  MDM: 81 year old male with past medical history listed above presents for syncopal episodes.  Has had 2 episodes today on 2 attempts of standing up.  He had 1 episode after attempting to go to the bathroom, and second episode while EMS was with him and he attempted to stand up.  Following this episode patient had systolic BP of 60.  Patient took his first dose of midodrine, fludrocortisone today after discharge.  He was discharged this morning for same complaint around 11 AM.  Denies chest pain, shortness of breath.  He does state he has not been feeling well today compared to the past several days.  He did not want to mention this so he would be able to be discharged today.  Will obtain basic lab work, troponin and provide 500 mL of fluid bolus.  Suspect patient will need admission given poorly controlled orthostasis and  frequent syncopal episode. CBC is without leukocytosis, and hemoglobin is at patient's baseline.  CMP shows slightly improved creatinine from earlier today.  However this is increased from his baseline which is around mid 2.  Troponin of 28 which is around his baseline.  EKG without acute ischemic changes.  Will discuss with hospitalist for admission. Case discussed with hospitalist who will evaluate patient for admission.  Lab Tests: -I ordered, reviewed, and interpreted labs.   The pertinent results include:   Labs Reviewed  CBC WITH DIFFERENTIAL/PLATELET - Abnormal; Notable for the following components:      Result Value   RBC 2.96 (*)    Hemoglobin 9.2 (*)    HCT 26.0 (*)    Neutro Abs 7.8 (*)    Lymphs Abs 0.5 (*)    All other components within normal limits  COMPREHENSIVE METABOLIC PANEL  URINALYSIS, ROUTINE W REFLEX MICROSCOPIC  TROPONIN I (HIGH SENSITIVITY)      EKG  EKG Interpretation  Date/Time:    Ventricular Rate:    PR Interval:    QRS Duration:   QT Interval:    QTC Calculation:   R Axis:     Text Interpretation:           Medicines ordered and prescription drug management: Meds ordered this encounter  Medications   sodium chloride 0.9 % bolus 500 mL    -I have reviewed the patients home medicines and have made adjustments as needed  Cardiac Monitoring: The patient was maintained on a cardiac monitor.  I personally viewed and interpreted the cardiac monitored which showed an underlying rhythm of: Paced rhythm  Reevaluation: After the interventions noted above, I reevaluated the patient and found that they have :stayed the same  Co morbidities that complicate the patient evaluation  Past Medical History:  Diagnosis Date   Asthma    Carcinoid tumor, small intestine, malignant (HCC)    Chronic diarrhea    CKD (chronic kidney disease) stage 3, GFR 30-59 ml/min (HCC)  HTN (hypertension)    Short gut syndrome       Dispostion: Patient will  be admitted to hospitalist service.   Final Clinical Impression(s) / ED Diagnoses Final diagnoses:  Syncope and collapse    Rx / DC Orders ED Discharge Orders     None         Evlyn Courier, PA-C 02/01/22 1941    Elgie Congo, MD 02/01/22 2226

## 2022-02-02 ENCOUNTER — Inpatient Hospital Stay (HOSPITAL_COMMUNITY): Payer: Medicare Other

## 2022-02-02 ENCOUNTER — Encounter (HOSPITAL_COMMUNITY): Payer: Self-pay | Admitting: Internal Medicine

## 2022-02-02 DIAGNOSIS — I480 Paroxysmal atrial fibrillation: Secondary | ICD-10-CM

## 2022-02-02 DIAGNOSIS — D649 Anemia, unspecified: Secondary | ICD-10-CM

## 2022-02-02 DIAGNOSIS — R609 Edema, unspecified: Secondary | ICD-10-CM

## 2022-02-02 DIAGNOSIS — I951 Orthostatic hypotension: Secondary | ICD-10-CM | POA: Diagnosis not present

## 2022-02-02 DIAGNOSIS — N184 Chronic kidney disease, stage 4 (severe): Secondary | ICD-10-CM

## 2022-02-02 LAB — COMPREHENSIVE METABOLIC PANEL
ALT: 11 U/L (ref 0–44)
AST: 13 U/L — ABNORMAL LOW (ref 15–41)
Albumin: 2.6 g/dL — ABNORMAL LOW (ref 3.5–5.0)
Alkaline Phosphatase: 34 U/L — ABNORMAL LOW (ref 38–126)
Anion gap: 6 (ref 5–15)
BUN: 36 mg/dL — ABNORMAL HIGH (ref 8–23)
CO2: 19 mmol/L — ABNORMAL LOW (ref 22–32)
Calcium: 7.1 mg/dL — ABNORMAL LOW (ref 8.9–10.3)
Chloride: 120 mmol/L — ABNORMAL HIGH (ref 98–111)
Creatinine, Ser: 2.47 mg/dL — ABNORMAL HIGH (ref 0.61–1.24)
GFR, Estimated: 26 mL/min — ABNORMAL LOW (ref >60)
Glucose, Bld: 103 mg/dL — ABNORMAL HIGH (ref 70–99)
Potassium: 2.9 mmol/L — ABNORMAL LOW (ref 3.5–5.1)
Sodium: 145 mmol/L (ref 135–145)
Total Bilirubin: 0.7 mg/dL (ref 0.3–1.2)
Total Protein: 4.8 g/dL — ABNORMAL LOW (ref 6.5–8.1)

## 2022-02-02 LAB — CBC
HCT: 20.7 % — ABNORMAL LOW (ref 39.0–52.0)
HCT: 26 % — ABNORMAL LOW (ref 39.0–52.0)
Hemoglobin: 7.2 g/dL — ABNORMAL LOW (ref 13.0–17.0)
Hemoglobin: 8.9 g/dL — ABNORMAL LOW (ref 13.0–17.0)
MCH: 31.2 pg (ref 26.0–34.0)
MCH: 30.3 pg (ref 26.0–34.0)
MCHC: 34.8 g/dL (ref 30.0–36.0)
MCHC: 34.2 g/dL (ref 30.0–36.0)
MCV: 89.6 fL (ref 80.0–100.0)
MCV: 88.4 fL (ref 80.0–100.0)
Platelets: 121 10*3/uL — ABNORMAL LOW (ref 150–400)
Platelets: 145 10*3/uL — ABNORMAL LOW (ref 150–400)
RBC: 2.31 MIL/uL — ABNORMAL LOW (ref 4.22–5.81)
RBC: 2.94 MIL/uL — ABNORMAL LOW (ref 4.22–5.81)
RDW: 13.9 % (ref 11.5–15.5)
RDW: 13.8 % (ref 11.5–15.5)
WBC: 7.2 10*3/uL (ref 4.0–10.5)
WBC: 9.3 10*3/uL (ref 4.0–10.5)
nRBC: 0 % (ref 0.0–0.2)
nRBC: 0 % (ref 0.0–0.2)

## 2022-02-02 LAB — BASIC METABOLIC PANEL
Anion gap: 8 (ref 5–15)
BUN: 39 mg/dL — ABNORMAL HIGH (ref 8–23)
CO2: 22 mmol/L (ref 22–32)
Calcium: 8.9 mg/dL (ref 8.9–10.3)
Chloride: 112 mmol/L — ABNORMAL HIGH (ref 98–111)
Creatinine, Ser: 2.65 mg/dL — ABNORMAL HIGH (ref 0.61–1.24)
GFR, Estimated: 24 mL/min — ABNORMAL LOW (ref >60)
Glucose, Bld: 130 mg/dL — ABNORMAL HIGH (ref 70–99)
Potassium: 3.7 mmol/L (ref 3.5–5.1)
Sodium: 142 mmol/L (ref 135–145)

## 2022-02-02 LAB — RETICULOCYTES
Immature Retic Fract: 17.9 % — ABNORMAL HIGH (ref 2.3–15.9)
RBC.: 2.33 MIL/uL — ABNORMAL LOW (ref 4.22–5.81)
Retic Count, Absolute: 48.2 10*3/uL (ref 19.0–186.0)
Retic Ct Pct: 2.1 % (ref 0.4–3.1)

## 2022-02-02 LAB — IRON AND TIBC
Iron: 65 ug/dL (ref 45–182)
Saturation Ratios: 30 % (ref 17.9–39.5)
TIBC: 214 ug/dL — ABNORMAL LOW (ref 250–450)
UIBC: 149 ug/dL

## 2022-02-02 LAB — FOLATE: Folate: 16.4 ng/mL (ref >5.9)

## 2022-02-02 LAB — MAGNESIUM: Magnesium: 2 mg/dL (ref 1.7–2.4)

## 2022-02-02 LAB — VITAMIN B12: Vitamin B-12: 251 pg/mL (ref 180–914)

## 2022-02-02 LAB — FERRITIN: Ferritin: 122 ng/mL (ref 24–336)

## 2022-02-02 MED ORDER — THIAMINE HCL 100 MG PO TABS
50.0000 mg | ORAL_TABLET | Freq: Every day | ORAL | Status: DC
  Administered 2022-02-02 – 2022-02-09 (×8): 50 mg via ORAL
  Filled 2022-02-02 (×8): qty 1

## 2022-02-02 MED ORDER — TECHNETIUM TO 99M ALBUMIN AGGREGATED
4.0000 | Freq: Once | INTRAVENOUS | Status: AC | PRN
  Administered 2022-02-02: 4 via INTRAVENOUS

## 2022-02-02 MED ORDER — MIRTAZAPINE 15 MG PO TABS
30.0000 mg | ORAL_TABLET | Freq: Every day | ORAL | Status: DC
  Administered 2022-02-02 – 2022-02-08 (×7): 30 mg via ORAL
  Filled 2022-02-02 (×8): qty 2

## 2022-02-02 MED ORDER — ALBUTEROL SULFATE (2.5 MG/3ML) 0.083% IN NEBU
2.5000 mg | INHALATION_SOLUTION | Freq: Four times a day (QID) | RESPIRATORY_TRACT | Status: DC | PRN
  Administered 2022-02-04 – 2022-02-09 (×5): 2.5 mg via RESPIRATORY_TRACT
  Filled 2022-02-02 (×5): qty 3

## 2022-02-02 MED ORDER — MAGNESIUM SULFATE 2 GM/50ML IV SOLN
2.0000 g | Freq: Once | INTRAVENOUS | Status: AC
  Administered 2022-02-02: 2 g via INTRAVENOUS
  Filled 2022-02-02: qty 50

## 2022-02-02 MED ORDER — BOOST / RESOURCE BREEZE PO LIQD CUSTOM
237.0000 mL | Freq: Two times a day (BID) | ORAL | Status: DC
  Administered 2022-02-02 – 2022-02-05 (×4): 1 via ORAL

## 2022-02-02 MED ORDER — POTASSIUM CHLORIDE 10 MEQ/100ML IV SOLN
10.0000 meq | Freq: Once | INTRAVENOUS | Status: AC
Start: 2022-02-02 — End: 2022-02-02
  Administered 2022-02-02: 10 meq via INTRAVENOUS
  Filled 2022-02-02: qty 100

## 2022-02-02 MED ORDER — POTASSIUM CHLORIDE 10 MEQ/100ML IV SOLN
10.0000 meq | INTRAVENOUS | Status: AC
  Administered 2022-02-02 (×3): 10 meq via INTRAVENOUS
  Filled 2022-02-02 (×4): qty 100

## 2022-02-02 MED ORDER — ADULT MULTIVITAMIN W/MINERALS CH
1.0000 | ORAL_TABLET | Freq: Every day | ORAL | Status: DC
  Administered 2022-02-02 – 2022-02-09 (×8): 1 via ORAL
  Filled 2022-02-02 (×8): qty 1

## 2022-02-02 MED ORDER — POTASSIUM CHLORIDE CRYS ER 20 MEQ PO TBCR
20.0000 meq | EXTENDED_RELEASE_TABLET | Freq: Two times a day (BID) | ORAL | Status: DC
  Administered 2022-02-02 – 2022-02-06 (×10): 20 meq via ORAL
  Filled 2022-02-02 (×10): qty 1

## 2022-02-02 NOTE — Care Management (Signed)
  Transition of Care (TOC) Screening Note   Patient Details  Name: Alan [name] Date of Birth: 08/12/1940   Transition of Care [facility]) CM/SW Contact:    Bethena Roys, RN Phone Number: 02/02/2022, 1:15 PM    Transition of Care Department [facility]) has reviewed the patient and no TOC needs have been identified at this time. Case Manager will continue to monitor patient advancement through interdisciplinary progression rounds. If new patient transition needs arise, please place a TOC consult.

## 2022-02-02 NOTE — Progress Notes (Signed)
Lower extremity venous has been completed.   Preliminary results in CV Proc.   Alan [name] 02/02/2022 1:58 PM

## 2022-02-02 NOTE — ED Notes (Signed)
ED TO INPATIENT HANDOFF REPORT  ED Nurse Name and Phone #: [facility] Name/Age/Gender Alan [name] 81 y.o. male Room/Bed: 029C/029C  Code Status   Code Status: Full Code  Home/SNF/Other Home Patient oriented to: self, place, time, and situation Is this baseline? Yes   Triage Complete: Triage complete  Chief Complaint Syncope [R55]  Triage Note Pt d/c'd this morning for the hospital. Seen for multiple syncopal episode. Called ems today d/t went to the bathroom and when he stood up and passed out. He didn't wanted to come the hospital. Ems did orthostatics and when he stood up he has a bp of 60 palp and had another syncopal episode.    Allergies Allergies  Allergen Reactions   Penicillins Hives and Shortness Of Breath   Lisinopril Itching    Level of Care/Admitting Diagnosis ED Disposition     ED Disposition  Admit   Condition  --   Comment  Hospital Area: [place] [[id number]]  Level of Care: Telemetry Cardiac [103]  May admit patient to [facility] or [facility] if equivalent level of care is available:: Yes  Covid Evaluation: Symptomatic Person Under Investigation (PUI) or recent exposure (last 10 days) *Testing Required*  Diagnosis: Syncope [206001]  Admitting Physician: Clance Boll [[id number]]  Attending Physician: Clance Boll [[id number]]  Certification:: I certify this patient will need inpatient services for at least 2 midnights  Estimated Length of Stay: 2          B Medical/Surgery History Past Medical History:  Diagnosis Date   Asthma    Carcinoid tumor, small intestine, malignant ([place])    Chronic diarrhea    CKD (chronic kidney disease) stage 3, GFR 30-59 ml/min (HCC)    HTN (hypertension)    Short gut syndrome    Past Surgical History:  Procedure Laterality Date   COLON SURGERY     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   Procedure: PACEMAKER IMPLANT;  Surgeon: Constance Haw, MD;  Location: [facility];  Service: Cardiovascular;  Laterality: N/A;     A IV Location/Drains/Wounds Patient Lines/Drains/Airways Status     Active Line/Drains/Airways     Name Placement date Placement time Site Days   Peripheral IV 02/01/22 20 G Anterior;Left Forearm 02/01/22  1911  Forearm  1   Incision (Closed) 04/23/21 Chest Lateral;Left;Upper 04/23/21  1930  -- 285   Wound / Incision (Open or Dehisced) 01/30/22 Other (Comment) Shoulder Right;Posterior full thickness wound where previous abscess was drained 01/30/22  --  Shoulder  3            Intake/Output Last 24 hours No intake or output data in the 24 hours ending 02/02/22 0535  Labs/Imaging Results for orders placed or performed during the hospital encounter of 02/01/22 (from the past 48 hour(s))  Troponin I (High Sensitivity)     Status: Abnormal   Collection Time: 02/01/22  5:48 PM  Result Value Ref Range   Troponin I (High Sensitivity) 28 (H) <18 ng/L    Comment: (NOTE) Elevated high sensitivity troponin I (hsTnI) values and significant  changes across serial measurements may suggest ACS but many other  chronic and acute conditions are known to elevate hsTnI results.  Refer to the "Links" section for chest pain algorithms and additional  guidance. Performed at [facility], [place] [address], [place], [place] [address]   Comprehensive metabolic panel     Status: Abnormal   Collection Time: 02/01/22  5:48 PM  Result Value Ref Range   Sodium 145 135 - 145 mmol/L   Potassium 3.5 3.5 - 5.1 mmol/L   Chloride 115 (H) 98 - 111 mmol/L   CO2 23 22 - 32 mmol/L   Glucose, Bld 131 (H) 70 - 99 mg/dL    Comment: Glucose reference range applies only to samples taken after fasting for at least 8 hours.   BUN 42 (H) 8 - 23 mg/dL   Creatinine, Ser 3.18 (H) 0.61 - 1.24 mg/dL   Calcium 8.6 (L) 8.9 - 10.3 mg/dL   Total Protein 6.3 (L) 6.5 - 8.1 g/dL   Albumin 3.6 3.5 - 5.0 g/dL   AST 17 15 - 41 U/L   ALT 12 0 - 44 U/L    Alkaline Phosphatase 44 38 - 126 U/L   Total Bilirubin 0.7 0.3 - 1.2 mg/dL   GFR, Estimated 19 (L) >60 mL/min    Comment: (NOTE) Calculated using the CKD-EPI Creatinine Equation (2021)    Anion gap 7 5 - 15    Comment: Performed at [facility], [place] [address] [place], [place] [address]  CBC with Differential     Status: Abnormal   Collection Time: 02/01/22  5:48 PM  Result Value Ref Range   WBC 9.2 4.0 - 10.5 K/uL   RBC 2.96 (L) 4.22 - 5.81 MIL/uL   Hemoglobin 9.2 (L) 13.0 - 17.0 g/dL   HCT 26.0 (L) 39.0 - 52.0 %   MCV 87.8 80.0 - 100.0 fL   MCH 31.1 26.0 - 34.0 pg   MCHC 35.4 30.0 - 36.0 g/dL   RDW 13.8 11.5 - 15.5 %   Platelets 151 150 - 400 K/uL   nRBC 0.0 0.0 - 0.2 %   Neutrophils Relative % 86 %   Neutro Abs 7.8 (H) 1.7 - 7.7 K/uL   Lymphocytes Relative 6 %   Lymphs Abs 0.5 (L) 0.7 - 4.0 K/uL   Monocytes Relative 8 %   Monocytes Absolute 0.7 0.1 - 1.0 K/uL   Eosinophils Relative 0 %   Eosinophils Absolute 0.0 0.0 - 0.5 K/uL   Basophils Relative 0 %   Basophils Absolute 0.0 0.0 - 0.1 K/uL   Immature Granulocytes 0 %   Abs Immature Granulocytes 0.03 0.00 - 0.07 K/uL    Comment: Performed at [facility], [place] [address] [place], [place] [address]  Troponin I (High Sensitivity)     Status: Abnormal   Collection Time: 02/01/22  7:08 PM  Result Value Ref Range   Troponin I (High Sensitivity) 24 (H) <18 ng/L    Comment: (NOTE) Elevated high sensitivity troponin I (hsTnI) values and significant  changes across serial measurements may suggest ACS but many other  chronic and acute conditions are known to elevate hsTnI results.  Refer to the "Links" section for chest pain algorithms and additional  guidance. Performed at [facility], [place] [address], [place], [place] [address]   Urinalysis, Routine w reflex microscopic Urine, Clean Catch     Status: Abnormal   Collection Time: 02/01/22  7:12 PM  Result Value Ref Range   Color, Urine YELLOW YELLOW    APPearance HAZY (A) CLEAR   Specific Gravity, Urine 1.011 1.005 - 1.030   pH 5.0 5.0 - 8.0   Glucose, UA NEGATIVE NEGATIVE mg/dL   Hgb urine dipstick NEGATIVE NEGATIVE   Bilirubin Urine NEGATIVE NEGATIVE   Ketones, ur NEGATIVE NEGATIVE mg/dL   Protein, ur 100 (  A) NEGATIVE mg/dL   Nitrite NEGATIVE NEGATIVE   Leukocytes,Ua NEGATIVE NEGATIVE   RBC / HPF 0-5 0 - 5 RBC/hpf   WBC, UA 0-5 0 - 5 WBC/hpf   Bacteria, UA NONE SEEN NONE SEEN   Squamous Epithelial / LPF 0-5 0 - 5   Mucus PRESENT     Comment: Performed at [facility], [place] [address] [place], [place] [address]  CBG monitoring, ED     Status: Abnormal   Collection Time: 02/01/22  9:46 PM  Result Value Ref Range   Glucose-Capillary 101 (H) 70 - 99 mg/dL    Comment: Glucose reference range applies only to samples taken after fasting for at least 8 hours.  CBC     Status: Abnormal   Collection Time: 02/01/22  9:57 PM  Result Value Ref Range   WBC 9.0 4.0 - 10.5 K/uL   RBC 2.82 (L) 4.22 - 5.81 MIL/uL   Hemoglobin 8.8 (L) 13.0 - 17.0 g/dL   HCT 24.8 (L) 39.0 - 52.0 %   MCV 87.9 80.0 - 100.0 fL   MCH 31.2 26.0 - 34.0 pg   MCHC 35.5 30.0 - 36.0 g/dL   RDW 13.9 11.5 - 15.5 %   Platelets 142 (L) 150 - 400 K/uL   nRBC 0.0 0.0 - 0.2 %    Comment: Performed at [facility], [place] [address], [place], [place] [address]  Creatinine, serum     Status: Abnormal   Collection Time: 02/01/22  9:57 PM  Result Value Ref Range   Creatinine, Ser 3.02 (H) 0.61 - 1.24 mg/dL   GFR, Estimated 20 (L) >60 mL/min    Comment: (NOTE) Calculated using the CKD-EPI Creatinine Equation (2021) Performed at [facility] [address], [place], [place] [address]   D-dimer, quantitative     Status: Abnormal   Collection Time: 02/01/22  9:57 PM  Result Value Ref Range   D-Dimer, Quant 1.47 (H) 0.00 - 0.50 ug/mL-FEU    Comment: (NOTE) At the manufacturer cut-off value of 0.5 g/mL FEU, this assay has a negative predictive value of  95-100%.This assay is intended for use in conjunction with a clinical pretest probability (PTP) assessment model to exclude pulmonary embolism (PE) and deep venous thrombosis (DVT) in outpatients suspected of PE or DVT. Results should be correlated with clinical presentation. Performed at [facility], [place] [address] [place], [place] [address]   Magnesium     Status: Abnormal   Collection Time: 02/01/22  9:57 PM  Result Value Ref Range   Magnesium 1.5 (L) 1.7 - 2.4 mg/dL    Comment: Performed at [facility] [address] [place], [place] [address]  Troponin I (High Sensitivity)     Status: Abnormal   Collection Time: 02/01/22  9:57 PM  Result Value Ref Range   Troponin I (High Sensitivity) 32 (H) <18 ng/L    Comment: (NOTE) Elevated high sensitivity troponin I (hsTnI) values and significant  changes across serial measurements may suggest ACS but many other  chronic and acute conditions are known to elevate hsTnI results.  Refer to the "Links" section for chest pain algorithms and additional  guidance. Performed at [facility], [place] [address], [place], [place] [address]   Troponin I (High Sensitivity)     Status: Abnormal   Collection Time: 02/01/22 10:56 PM  Result Value Ref Range   Troponin I (High Sensitivity) 28 (H) <18 ng/L  Comment: (NOTE) Elevated high sensitivity troponin I (hsTnI) values and significant  changes across serial measurements may suggest ACS but many other  chronic and acute conditions are known to elevate hsTnI results.  Refer to the "Links" section for chest pain algorithms and additional  guidance. Performed at [facility], [place] [address], [place], [place] [address]   Comprehensive metabolic panel     Status: Abnormal   Collection Time: 02/02/22  3:00 AM  Result Value Ref Range   Sodium 145 135 - 145 mmol/L   Potassium 2.9 (L) 3.5 - 5.1 mmol/L   Chloride 120 (H) 98 - 111 mmol/L   CO2 19 (L) 22 - 32 mmol/L    Glucose, Bld 103 (H) 70 - 99 mg/dL    Comment: Glucose reference range applies only to samples taken after fasting for at least 8 hours.   BUN 36 (H) 8 - 23 mg/dL   Creatinine, Ser 2.47 (H) 0.61 - 1.24 mg/dL   Calcium 7.1 (L) 8.9 - 10.3 mg/dL   Total Protein 4.8 (L) 6.5 - 8.1 g/dL   Albumin 2.6 (L) 3.5 - 5.0 g/dL   AST 13 (L) 15 - 41 U/L   ALT 11 0 - 44 U/L   Alkaline Phosphatase 34 (L) 38 - 126 U/L   Total Bilirubin 0.7 0.3 - 1.2 mg/dL   GFR, Estimated 26 (L) >60 mL/min    Comment: (NOTE) Calculated using the CKD-EPI Creatinine Equation (2021)    Anion gap 6 5 - 15    Comment: Performed at [facility], [place] [address], [place], [place] [address]  CBC     Status: Abnormal   Collection Time: 02/02/22  3:00 AM  Result Value Ref Range   WBC 7.2 4.0 - 10.5 K/uL   RBC 2.31 (L) 4.22 - 5.81 MIL/uL   Hemoglobin 7.2 (L) 13.0 - 17.0 g/dL   HCT 20.7 (L) 39.0 - 52.0 %   MCV 89.6 80.0 - 100.0 fL   MCH 31.2 26.0 - 34.0 pg   MCHC 34.8 30.0 - 36.0 g/dL   RDW 13.9 11.5 - 15.5 %   Platelets 121 (L) 150 - 400 K/uL   nRBC 0.0 0.0 - 0.2 %    Comment: Performed at [facility], [place] [address] [place], [place] [address]   No results found.  Pending Labs Unresulted Labs (From admission, onward)    None       Vitals/Pain Today's Vitals   02/02/22 0130 02/02/22 0200 02/02/22 0245 02/02/22 0345  BP: (!) 181/90 (!) 188/95 (!) 190/95 (!) 149/96  Pulse: 68 70 64 67  Resp: '12 17 16 16  '$ Temp:   97.9 F (36.6 C)   TempSrc:   Oral   SpO2: 97% 100% 98% 98%  PainSc:        Isolation Precautions No active isolations  Medications Medications  aspirin EC tablet 81 mg (has no administration in time range)  midodrine (PROAMATINE) tablet 15 mg (has no administration in time range)  fludrocortisone (FLORINEF) tablet 0.1 mg (has no administration in time range)  diphenoxylate-atropine (LOMOTIL) 2.5-0.025 MG per tablet 1 tablet (has no administration in time range)   mometasone-formoterol (DULERA) 200-5 MCG/ACT inhaler 2 puff (0 puffs Inhalation Hold 02/01/22 2233)  heparin injection 5,000 Units (5,000 Units Subcutaneous Given 02/01/22 2151)  0.9 %  sodium chloride infusion ( Intravenous New Bag/Given 02/01/22 2156)  acetaminophen (TYLENOL) tablet 650 mg (has no administration in time range)  Or  acetaminophen (TYLENOL) suppository 650 mg (has no administration in time range)  ondansetron (ZOFRAN) tablet 4 mg (has no administration in time range)    Or  ondansetron (ZOFRAN) injection 4 mg (has no administration in time range)  albuterol (PROVENTIL) (2.5 MG/3ML) 0.083% nebulizer solution 2.5 mg (has no administration in time range)  sodium chloride 0.9 % bolus 500 mL (0 mLs Intravenous Stopped 02/01/22 2233)  hydrOXYzine (ATARAX) tablet 25 mg (25 mg Oral Given 02/01/22 2155)    Mobility walks with person assist High fall risk   Focused Assessments Neuro Assessment Handoff:  Swallow screen pass? Yes          Neuro Assessment: Exceptions to WDL Neuro Checks:      Last Documented NIHSS Modified Score:   Has TPA been given? No If patient is a Neuro Trauma and patient is going to OR before floor call report to [employer] nurse: [phone] or [phone]   R Recommendations: See Admitting Provider Note  Report given to:   Additional Notes: pt is AAOx4. Pt is ambulatory with assistance. Pt is on RA. Pt is using urinal and has brief on.

## 2022-02-02 NOTE — Progress Notes (Signed)
PROGRESS NOTE    Brennyn Haisley Renwick  QMG:[id number] DOB: December 10, 1940 DOA: 02/01/2022 PCP: [name], MD   Brief Narrative:  HPI: Carole Deere Esqueda is a 81 y.o. male with medical history significant of  intestinal carcinoid, chronic diarrhea, CKD stage 3, anemia, paroxysmal atrial fibrillation, pulmonary embolism, tachy brady syndrome sp pacemaker, orthostatic hypotension, who was recently discharged today after  hospital stay 8/3-8/6  for recurrent syncope above his baseline due to progressive orthostatic hypotension. Patient was treated with ivfs with improvement and per wife only had one episode of syncope while in house which is close to his baseline. He was discharged on midodrine and florinef.  Patient states day of discharge he did not feel well. He describes feeling as feeling heavy. He states he did not mention this because he wanted to go home. However at home he continue to feel unwell and had two syncopal episodes with bp 60 systolic in the field per notes.  Patient currently states he has been complaint with his medications, he notes he did have episode of loose stools and note he did not eat well. He notes no chest pain , sob,  n/v/. He does endorse weight loss.      ED Course:  Vitals: afeb, bp163/87(170/121),  hr 81, rr 16 sat 96% on ra EKG: Sinus rhythm, LVH nonspecific twave abn  Ce28,24 (prior high 124) NA:145, K 3.5, CL115, Glucose131, cr 3.18 trending toward baseline of 2.68 UA neg Mag 1.5  Assessment & Plan:   Active Problems:   Orthostatic hypotension   Paroxysmal atrial fibrillation (HCC)   Syncope   CKD (chronic kidney disease), stage IV (HCC)   Acute on chronic anemia  Syncope/orthostatic hypotension: Ongoing and recurrent issue.  Patient was just discharged on 02/01/2022 and presented back to the hospital within half a day with similar symptoms.  He was discharged on Florinef and midodrine and was clearly instructed to take antihypertensives only when his blood  pressure is more than 326 systolic and patient claims that he was compliant.  Patient was upset today even before I entered his room demanding a cure for his problem.  I informed him that the medical community might not be able to find a cure for him however this is likely secondary to carcinoid syndrome for which he is oncologist has already started him on lanreotide every 4 weeksx 6 . continue to hold antihypertensives and continue midodrine and Florinef and monitor closely.  Check orthostatics.  History of atrial fibrillation: Rates controlled.  Not on any anticoagulation due to history of GI bleed.  Acute on chronic anemia: Has chronic normocytic anemia.  Baseline hemoglobin around 9, currently 7.2.  We will check iron studies, FOBT.  Repeat at noon.  Transfuse if less than 7.  Hypomagnesemia/hypokalemia: Both were replaced yesterday.  We will follow labs today.  History of PE: D-dimer elevated, will proceed with VQ scan and Doppler lower extremity to rule out PE and DVT respectively however due to anemia and prior history of GI bleed, might not be a candidate for any anticoagulation.  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  Plan of care discussed with patient in length and  he/she verbalized understanding and agreed with it.  Status is: Inpatient Remains inpatient appropriate because: Needs further monitoring and needs PE and DVT rule out.   Estimated body mass index is 17.6 kg/m as calculated from the following:   Height as of 01/29/22: '6\' 1"'$  (1.854 m).   Weight as of 01/29/22: 60.5 kg.    Nutritional Assessment: There is no height or weight on file to calculate  BMI.. Seen by dietician.  I agree with the assessment and plan as outlined below: Nutrition Status:        . Skin Assessment: I have examined the patient's skin and I agree with the wound assessment as performed by the wound care RN as outlined below:    Consultants:  None  Procedures:  None  Antimicrobials:  Anti-infectives (From admission, onward)    None         Subjective: Patient seen and examined.  He was upset for returning back to the hospital and blames the physician in the hospital system for that stating that " I am very angry, I do not think I am getting appropriate care, I keep coming back to the hospital, I do not want to do that and I do not want to keep spending money on this".   Objective: Vitals:   02/02/22 0345 02/02/22 0545 02/02/22 0615 02/02/22 0735  BP: (!) 149/96 (!) 188/106 (!) 175/109 (!) 181/123  Pulse: 67 65 68 72  Resp: '16 15 11 18  '$ Temp:  98 F (36.7 C)  97.7 F (36.5 C)  TempSrc:    Oral  SpO2: 98% 100% 100% 95%    Intake/Output Summary (Last 24 hours) at 02/02/2022 0748 Last data filed at 02/02/2022 0732 Gross per 24 hour  Intake 100 ml  Output --  Net 100 ml   There were no vitals filed for this visit.  Examination:  General exam: Appears calm and comfortable  Respiratory system: Clear to auscultation. Respiratory effort normal. Cardiovascular system: S1 & S2 heard, RRR. No JVD, murmurs, rubs, gallops or clicks. No pedal edema. Gastrointestinal system: Abdomen is nondistended, soft and nontender. No organomegaly or masses felt. Normal bowel sounds heard. Central nervous system: Alert and oriented. No focal neurological deficits. Extremities: Symmetric 5 x 5 power. Skin: No rashes, lesions or ulcers Psychiatry: Angry  Data Reviewed: I have personally reviewed following labs and imaging studies  CBC: Recent Labs  Lab 01/27/22 1510 01/29/22 1659 01/29/22 2223 01/30/22 0256 01/31/22 0348 02/01/22 1748 02/01/22 2157  02/02/22 0300  WBC 7.0 7.4   < > 7.2 8.9 9.2 9.0 7.2  NEUTROABS 4.7 5.9  --   --   --  7.8*  --   --   HGB 10.0* 9.5*   < > 9.3* 9.3* 9.2* 8.8* 7.2*  HCT 28.6* 27.1*   < > 26.2* 27.2* 26.0* 24.8* 20.7*  MCV 87.5 87.7   < > 87.0 88.3 87.8 87.9 89.6  PLT 196 185   < > 163 153 151 142* 121*   < > = values in this interval not displayed.   Basic Metabolic Panel: Recent Labs  Lab 01/29/22 1659 01/29/22 2223 01/30/22 0256 01/31/22 0348 02/01/22 1748 02/01/22 2157 02/02/22 0300  NA 138  --  141 140 145  --  145  K 4.1  --  4.3 3.9 3.5  --  2.9*  CL 106  --  111 110 115*  --  120*  CO2 20*  --  21* 21* 23  --  19*  GLUCOSE 128*  --  104* 107* 131*  --  103*  BUN 50*  --  47* 45* 42*  --  36*  CREATININE 3.83*   < > 3.37* 3.23* 3.18* 3.02* 2.47*  CALCIUM 9.2  --  8.9 8.7* 8.6*  --  7.1*  MG  --   --   --   --   --  1.5*  --    < > = values in this interval not displayed.   GFR: Estimated Creatinine Clearance: 20.4 mL/min (A) (by C-G formula based on SCr of 2.47 mg/dL (H)). Liver Function Tests: Recent Labs  Lab 01/27/22 1510 01/29/22 1659 02/01/22 1748 02/02/22 0300  AST 14* 18 17 13*  ALT '13 16 12 11  '$ ALKPHOS 48 50 44 34*  BILITOT 0.6 0.7 0.7 0.7  PROT 7.3 6.6 6.3* 4.8*  ALBUMIN 4.2 3.8 3.6 2.6*   Recent Labs  Lab 01/29/22 1659  LIPASE 36   No results for input(s): "AMMONIA" in the last 168 hours. Coagulation Profile: No results for input(s): "INR", "PROTIME" in the last 168 hours. Cardiac Enzymes: No results for input(s): "CKTOTAL", "CKMB", "CKMBINDEX", "TROPONINI" in the last 168 hours. BNP (last 3 results) No results for input(s): "PROBNP" in the last 8760 hours. HbA1C: No results for input(s): "HGBA1C" in the last 72 hours. CBG: Recent Labs  Lab 01/31/22 1112 02/01/22 2146  GLUCAP 102* 101*   Lipid Profile: No results for input(s): "CHOL", "HDL", "LDLCALC", "TRIG", "CHOLHDL", "LDLDIRECT" in the last 72 hours. Thyroid Function Tests: No results for  input(s): "TSH", "T4TOTAL", "FREET4", "T3FREE", "THYROIDAB" in the last 72 hours. Anemia Panel: No results for input(s): "VITAMINB12", "FOLATE", "FERRITIN", "TIBC", "IRON", "RETICCTPCT" in the last 72 hours. Sepsis Labs: No results for input(s): "PROCALCITON", "LATICACIDVEN" in the last 168 hours.  Recent Results (from the past 240 hour(s))  WOUND CULTURE     Status: None   Collection Time: 01/28/22 12:31 PM   Specimen: Wound  Result Value Ref Range Status   Source: R UPPER BACK  Final   Status: FINAL  Final   Gram Stain:   Final    Moderate White blood cells seen Rare epithelial cells Few Gram positive cocci in clusters   RESULT:   Final    Growth of skin flora (note: Growth does not include S. aureus, beta-hemolytic Streptococci or P. aeruginosa).     Radiology Studies: No results found.  Scheduled Meds:  aspirin EC  81 mg Oral Daily   feeding supplement (ENSURE COMPLETE)  237 mL Oral BID BM   fludrocortisone  0.1 mg Oral Daily   heparin  5,000 Units Subcutaneous Q8H   midodrine  15 mg Oral TID WC   mirtazapine  30 mg Oral Daily   mometasone-formoterol  2 puff Inhalation BID   multivitamin  1 tablet Oral Daily   Potassium Chloride ER  1 tablet Oral BID   thiamine  50 mg Oral Daily   Continuous Infusions:  sodium chloride 75 mL/hr at 02/01/22 2156   magnesium sulfate bolus IVPB     potassium chloride Stopped (02/02/22 0732)     LOS: 1 day   Darliss Cheney, MD [facility]  02/02/2022, 7:48 AM   *Please note that this is a verbal dictation therefore any spelling or grammatical errors are due to the "[place]" system interpretation.  Please page via [place] and do not message via secure chat for urgent patient care matters. Secure chat can be used for non  urgent patient care matters.  How to contact the [facility] Attending or Consulting provider [place] or covering provider during after hours [place], for this patient?  Check the care team in [facility] and look for a)  attending/consulting [facility] provider listed and b) the [facility] team listed. Page or secure chat [phone]. Log into [email or web address] and use [place]'s universal password to access. If you do not have the password, please contact the hospital operator. Locate the [facility] provider you are looking for under [facility] and page to a number that you can be directly reached. If you still have difficulty reaching the provider, please page the [facility] (Director on Call) for the Hospitalists listed on amion for assistance.

## 2022-02-03 DIAGNOSIS — I951 Orthostatic hypotension: Secondary | ICD-10-CM | POA: Diagnosis not present

## 2022-02-03 DIAGNOSIS — I480 Paroxysmal atrial fibrillation: Secondary | ICD-10-CM | POA: Diagnosis not present

## 2022-02-03 LAB — BASIC METABOLIC PANEL
Anion gap: 9 (ref 5–15)
BUN: 51 mg/dL — ABNORMAL HIGH (ref 8–23)
CO2: 22 mmol/L (ref 22–32)
Calcium: 8.9 mg/dL (ref 8.9–10.3)
Chloride: 111 mmol/L (ref 98–111)
Creatinine, Ser: 2.71 mg/dL — ABNORMAL HIGH (ref 0.61–1.24)
GFR, Estimated: 23 mL/min — ABNORMAL LOW (ref >60)
Glucose, Bld: 130 mg/dL — ABNORMAL HIGH (ref 70–99)
Potassium: 4 mmol/L (ref 3.5–5.1)
Sodium: 142 mmol/L (ref 135–145)

## 2022-02-03 LAB — MAGNESIUM: Magnesium: 1.8 mg/dL (ref 1.7–2.4)

## 2022-02-03 NOTE — NC FL2 (Signed)
[place] LEVEL OF CARE SCREENING TOOL     IDENTIFICATION  Patient Name: Alan [name] Birthdate: Jul 22, 1940 Sex: male Admission Date (Current Location): 02/01/2022  [facility] and [place] Number:  Herbalist and Address:  [facility] [place], [place] [address] [place], [place] [address]      Provider Number: [id number]  Attending Physician Name and Address:  Darliss Cheney, MD  Relative Name and Phone Number:  Ilda Foil [phone]    Current Level of Care: Hospital Recommended Level of Care: [place] Prior Approval Number:    Date Approved/Denied:   PASRR Number: [id number]  Discharge Plan: SNF    Current Diagnoses: Patient Active Problem List   Diagnosis Date Noted   CKD (chronic kidney disease), stage IV ([place]) 02/02/2022   Acute on chronic anemia 02/02/2022   Syncope 02/01/2022   Paroxysmal atrial fibrillation ([place]) 01/31/2022   History of pulmonary embolism 01/31/2022   Hypotension 01/31/2022   Acute kidney injury superimposed on chronic kidney disease ([place]) 01/28/2022   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   Back abscess 01/28/2022   Anemia due to acquired thiamine deficiency 12/10/2021   Deficiency anemia 12/04/2021   Oropharyngeal dysphagia 06/27/2021   Short gut syndrome    Major depressive disorder 04/22/2021   Sinus node dysfunction ([place]) 04/22/2021   Atrial fibrillation with rapid ventricular response ([place]) 04/22/2021   AV block, 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   Orthostatic hypotension 02/20/2021   Iron deficiency anemia due to chronic blood loss 02/20/2021   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[place]) 01/18/2021   GIB (gastrointestinal bleeding) 01/18/2021   Essential hypertension 01/18/2021    Orientation RESPIRATION BLADDER Height & Weight     Self, Time, Situation, Place  Normal Incontinent Weight: 136 lb 0.4 oz (61.7 kg) Height:  '6\' 1"'$  (185.4 cm)  BEHAVIORAL SYMPTOMS/MOOD NEUROLOGICAL  BOWEL NUTRITION STATUS      Incontinent Diet (Please see discharge summary)  AMBULATORY STATUS COMMUNICATION OF NEEDS Skin   Limited Assist Verbally Other (Comment) (Appropriate for ethnicity,dry,ecchymosis,arm,hand,bilateral,erythema,arm,bilateral,wound incision open or dehiced,shoulder,R,posterior,full thickness,wound where previous abscess was drained)                       Personal Care Assistance Level of Assistance  Bathing, Feeding, Dressing Bathing Assistance: Limited assistance Feeding assistance: Independent (able to feed self) Dressing Assistance: Limited assistance     Functional Limitations Info  Sight, Hearing, Speech Sight Info:  (WDL) Hearing Info: Adequate Speech Info: Adequate    SPECIAL CARE FACTORS FREQUENCY  PT (By licensed PT), OT (By licensed OT)     PT Frequency: 5x min weekly OT Frequency: 5x min weekly            Contractures Contractures Info: Not present    Additional Factors Info  Code Status, Allergies, Psychotropic Code Status Info: FULL Allergies Info: Penicillins,Lisinopril Psychotropic Info: mirtazapine (REMERON) tablet 30 mg daily at bedtime         Current Medications (02/03/2022):  This is the current hospital active medication list Current Facility-Administered Medications  Medication Dose Route Frequency Provider Last Rate Last Admin   acetaminophen (TYLENOL) tablet 650 mg  650 mg Oral Q6H PRN Clance Boll, MD       Or   acetaminophen (TYLENOL) suppository 650 mg  650 mg Rectal Q6H PRN Clance Boll, MD       albuterol (PROVENTIL) (2.5 MG/3ML) 0.083% nebulizer solution 2.5 mg  2.5 mg Inhalation Q6H PRN Pahwani,  Einar Grad, MD       aspirin EC tablet 81 mg  81 mg Oral Daily Myles Rosenthal A, MD   81 mg at 02/03/22 7425   diphenoxylate-atropine (LOMOTIL) 2.5-0.025 MG per tablet 1 tablet  1 tablet Oral BID PRN Clance Boll, MD       feeding supplement (BOOST / RESOURCE BREEZE) liquid 1 Container  237 mL Oral BID  BM Darliss Cheney, MD   1 Container at 02/03/22 0924   fludrocortisone (FLORINEF) tablet 0.1 mg  0.1 mg Oral Daily Myles Rosenthal A, MD   0.1 mg at 02/03/22 0924   heparin injection 5,000 Units  5,000 Units Subcutaneous Q8H Myles Rosenthal A, MD   5,000 Units at 02/03/22 1417   midodrine (PROAMATINE) tablet 15 mg  15 mg Oral TID WC Myles Rosenthal A, MD   15 mg at 02/03/22 1254   mirtazapine (REMERON) tablet 30 mg  30 mg Oral QHS Darliss Cheney, MD   30 mg at 02/02/22 2054   mometasone-formoterol (DULERA) 200-5 MCG/ACT inhaler 2 puff  2 puff Inhalation BID Clance Boll, MD   2 puff at 02/03/22 9563   multivitamin with minerals tablet 1 tablet  1 tablet Oral Daily Darliss Cheney, MD   1 tablet at 02/03/22 0924   ondansetron (ZOFRAN) tablet 4 mg  4 mg Oral Q6H PRN Clance Boll, MD       Or   ondansetron [facility]) injection 4 mg  4 mg Intravenous Q6H PRN Myles Rosenthal A, MD       potassium chloride SA (KLOR-CON M) CR tablet 20 mEq  20 mEq Oral BID Darliss Cheney, MD   20 mEq at 02/03/22 8756   thiamine (VITAMIN B1) tablet 50 mg  50 mg Oral Daily Darliss Cheney, MD   50 mg at 02/03/22 4332     Discharge Medications: Please see discharge summary for a list of discharge medications.  Relevant Imaging Results:  Relevant Lab Results:   Additional Information [phone], Both Covid Vaccines 1 booster  Milas Gain, LCSWA

## 2022-02-03 NOTE — Progress Notes (Signed)
After all the discussion with the patient and his wife for about 25 minutes in his room, agreeing with the discharge and completing discharge paperwork, patient worked with PT, he had near syncope episode and now they want to pursue SNF.  TOC is aware.  Discharge canceled.

## 2022-02-03 NOTE — Discharge Summary (Signed)
PatientPhysician Discharge Summary  Alan [name] Pho JGG:[id number] DOB: 25-Aug-1940 DOA: 02/01/2022  PCP: [name], MD  Admit date: 02/01/2022 Discharge date: 02/03/2022 30 Day Unplanned Readmission Risk Score    Flowsheet Row ED to Hosp-Admission (Current) from 02/01/2022 in [facility]  30 Day Unplanned Readmission Risk Score (%) 16.36 Filed at 02/03/2022 0801       This score is the patient's risk of an unplanned readmission within 30 days of being discharged (0 -100%). The score is based on dignosis, age, lab data, medications, orders, and past utilization.   Low:  0-14.9   Medium: 15-21.9   High: 22-29.9   Extreme: 30 and above          Admitted From: Home Disposition: Home  Recommendations for Outpatient Follow-up:  Follow up with PCP in 1-2 weeks Please obtain BMP/CBC in one week Please follow up with your PCP on the following pending results: Unresulted Labs (From admission, onward)     Start     Ordered   02/03/22 5465  Basic metabolic panel  Once,   R       Question:  Specimen collection method  Answer:  Lab=Lab collect   02/03/22 0832   02/03/22 0833  Magnesium  Once,   R       Question:  Specimen collection method  Answer:  Lab=Lab collect   02/03/22 0354   Unscheduled  Occult blood card to lab, stool  As needed,   R      02/02/22 0750              Home Health: None Equipment/Devices: None  Discharge Condition: Stable CODE STATUS: Full code Diet recommendation: Cardiac  Subjective: Seen and examined.  Wife at the bedside.  Feels much better.  No more dizziness or any chest pain or palpitation or shortness of breath or any other complaint.  HPI: Alan [name] is a 81 y.o. male with medical history significant of  intestinal carcinoid, chronic diarrhea, CKD stage 3, anemia, paroxysmal atrial fibrillation, pulmonary embolism, tachy brady syndrome sp pacemaker, orthostatic hypotension, who was recently discharged today after  hospital  stay 8/3-8/6  for recurrent syncope above his baseline due to progressive orthostatic hypotension. Patient was treated with ivfs with improvement and per wife only had one episode of syncope while in house which is close to his baseline. He was discharged on midodrine and florinef.  Patient states day of discharge he did not feel well. He describes feeling as feeling heavy. He states he did not mention this because he wanted to go home. However at home he continue to feel unwell and had two syncopal episodes with bp 60 systolic in the field per notes.  Patient currently states he has been complaint with his medications, he notes he did have episode of loose stools and note he did not eat well. He notes no chest pain , sob,  n/v/. He does endorse weight loss.      ED Course:  Vitals: afeb, bp163/87(170/121),  hr 81, rr 16 sat 96% on ra EKG: Sinus rhythm, LVH nonspecific twave abn  Ce28,24 (prior high 124) NA:145, K 3.5, CL115, Glucose131, cr 3.18 trending toward baseline of 2.68 UA neg Mag 1.5  Brief/Interim Summary:  Syncope/orthostatic hypotension: Ongoing and recurrent issue.  Patient was just discharged on 02/01/2022 and presented back to the hospital within half a day with similar symptoms.  He was discharged on Florinef and midodrine and was clearly instructed to  take antihypertensives only when his blood pressure is more than 191 systolic and patient claims that he was compliant.  Patient was upset due to recurrent syncope requiring recurrent hospitalization and demanding a cure for his problem.  I informed him that his symptoms are likely secondary to combination of carcinoid syndrome, tachybradycardia syndrome and atrial fibrillation.  Regarding carcinoid tumor, his oncologist has already started him on lanreotide every 4 weeksx 6 . continue to hold antihypertensives and only take them if blood pressure systolic is more than 478 and continue midodrine and Florinef and monitor closely.  I have  discussed with him about realistic expectations that he might have to live with this and that he will have to learn how to prevent a fall or trauma when he has syncope as he often has prodrome before those.   History of atrial fibrillation: Rates controlled.  Not on any anticoagulation due to history of GI bleed.   Acute on chronic anemia: Has chronic normocytic anemia.  Baseline hemoglobin around 9, currently dropped to 7.2 but then improved on its own to over 8.   Hypomagnesemia/hypokalemia: Both were replaced yesterday.  We will follow labs today.   History of PE: D-dimer elevated, due to CKD stage IV, not a candidate for CTA.  Obtained Doppler lower extremity which ruled out DVT.  VQ scan shows intermediate to high probability for PE.  I do see that patient was also diagnosed with small right-sided PE in July 2022 and he tells me that he was started on Eliquis but he only took 1 dose and then his cardiologist or someone called him to stop that stating that it was such a small blood clot that he did not need any anticoagulation.  Currently VQ scan shows intermediate to high probability but patient clinically does not appear to be having PE especially now that DVT is ruled out.  I had very lengthy discussion with the patient, his wife in the room about risks and benefits of anticoagulation versus no anticoagulation in the light of previous history of GI bleed.  Patient tells me that he has never had any GI bleed but I did explain to him that it is documented almost in every note, including the cardiologist and it appears that it is because of the previous history of GI bleed that patient was never placed on anticoagulation despite of having atrial fibrillation.  He also has chronic anemia with hemoglobin around 8 as baseline.  After detailed discussion, patient, his wife and I made a mutual decision to not start him on any anticoagulation and watch for any symptoms closely.  Patient is aware that if he  were to develop significant tachycardia, chest pain or shortness of breath, he is to seek medical attention and be reassessed for possible PE.   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  Discharge Diagnoses:  Active Problems:   Orthostatic hypotension   Paroxysmal atrial fibrillation (HCC)   Syncope   CKD (chronic kidney disease), stage IV (HCC)   Acute on chronic anemia    Discharge Instructions   Allergies as of 02/03/2022       Reactions   Penicillins Hives, Shortness Of Breath   Lisinopril Itching  Medication List     TAKE these medications    albuterol 108 (90 Base) MCG/ACT inhaler Commonly known as: VENTOLIN HFA Inhale 2 puffs into the lungs every 6 (six) hours as needed for wheezing or shortness of breath.   aspirin EC 81 MG tablet Take 81 mg by mouth daily. Swallow whole.   diphenoxylate-atropine 2.5-0.025 MG tablet Commonly known as: LOMOTIL Take 1 tablet by mouth 2 (two) times daily as needed for diarrhea or loose stools.   feeding supplement (ENSURE COMPLETE) Liqd Take 237 mLs by mouth 2 (two) times daily between meals.   fludrocortisone 0.1 MG tablet Commonly known as: FLORINEF Take 1 tablet (0.1 mg total) by mouth daily.   Imodium A-D 2 MG capsule Generic drug: loperamide Take 2 mg by mouth daily as needed for diarrhea or loose stools.   iron polysaccharides 150 MG capsule Commonly known as: NIFEREX TAKE 1 CAPSULE(150 MG) BY MOUTH 3 TIMES A WEEK What changed: See the new instructions.   LANREOTIDE ACETATE [place] Inject 1 Dose into the skin every 30 (thirty) days.   midodrine 5 MG tablet Commonly known as: PROAMATINE Take 3 tablets (15 mg total) by  mouth 3 (three) times daily with meals.   mirtazapine 30 MG tablet Commonly known as: REMERON Take 1 tablet (30 mg total) by mouth daily.   multivitamin Tabs tablet Take 1 tablet by mouth daily.   Potassium Chloride ER 20 MEQ Tbcr Take 1 tablet by mouth 2 (two) times daily.   PRESERVISION AREDS 2 PO Take 1 capsule by mouth daily.   Symbicort 160-4.5 MCG/ACT inhaler Generic drug: budesonide-formoterol Inhale 2 puffs into the lungs daily as needed (wheezing).   thiamine 50 MG tablet Commonly known as: VITAMIN B-1 Take 1 tablet (50 mg total) by mouth daily.        Follow-up Information     [name], MD Follow up in 1 week(s).   Specialty: Internal Medicine Contact information: [place] [address] [phone]         Sueanne Margarita, MD .   Specialty: Cardiology Contact information: [phone] [address] [place] [address] [phone]         Constance Haw, MD .   Specialty: Cardiology Contact information: [address] [place] [address] [phone]                Allergies  Allergen Reactions   Penicillins Hives and Shortness Of Breath   Lisinopril Itching    Consultations: None   Procedures/Studies: VAS [place] LOWER EXTREMITY VENOUS (DVT)  Result Date: 02/02/2022  Lower Venous DVT Study Patient Name:  ADRIANO BISCHOF [facility]  Date of Exam:   02/02/2022 Medical Rec #: [id number]        Accession #:    [id number] Date of Birth: 02/20/1941         Patient Gender: M Patient Age:   89 years Exam Location:  [facility] Procedure:      VAS [place] LOWER EXTREMITY VENOUS (DVT) Referring Phys: Aslyn Cottman --------------------------------------------------------------------------------  Indications: Edema.  Comparison Study: prior 01/19/21 Performing Technologist: Archie Patten RVS  Examination Guidelines: A complete evaluation includes B-mode imaging, spectral Doppler, color Doppler, and power Doppler as  needed of all accessible portions of each vessel. Bilateral testing is considered an integral part of a complete examination. Limited examinations for reoccurring indications may be performed as noted. The reflux portion of the exam is performed with the  patient in reverse Trendelenburg.  +---------+---------------+---------+-----------+----------+--------------+ RIGHT    CompressibilityPhasicitySpontaneityPropertiesThrombus Aging +---------+---------------+---------+-----------+----------+--------------+ CFV      Full           Yes      Yes                                 +---------+---------------+---------+-----------+----------+--------------+ SFJ      Full                                                        +---------+---------------+---------+-----------+----------+--------------+ FV Prox  Full                                                        +---------+---------------+---------+-----------+----------+--------------+ FV Mid   Full                                                        +---------+---------------+---------+-----------+----------+--------------+ FV DistalFull                                                        +---------+---------------+---------+-----------+----------+--------------+ PFV      Full                                                        +---------+---------------+---------+-----------+----------+--------------+ POP      Full           Yes      Yes                                 +---------+---------------+---------+-----------+----------+--------------+ PTV      Full                                                        +---------+---------------+---------+-----------+----------+--------------+ PERO     Full                                                        +---------+---------------+---------+-----------+----------+--------------+    +---------+---------------+---------+-----------+----------+--------------+ LEFT     CompressibilityPhasicitySpontaneityPropertiesThrombus Aging +---------+---------------+---------+-----------+----------+--------------+ CFV      Full           Yes      Yes                                 +---------+---------------+---------+-----------+----------+--------------+  SFJ      Full                                                        +---------+---------------+---------+-----------+----------+--------------+ FV Prox  Full                                                        +---------+---------------+---------+-----------+----------+--------------+ FV Mid   Full                                                        +---------+---------------+---------+-----------+----------+--------------+ FV DistalFull                                                        +---------+---------------+---------+-----------+----------+--------------+ PFV      Full                                                        +---------+---------------+---------+-----------+----------+--------------+ POP      Full           Yes      Yes                                 +---------+---------------+---------+-----------+----------+--------------+ PTV      Full                                                        +---------+---------------+---------+-----------+----------+--------------+ PERO     Full                                                        +---------+---------------+---------+-----------+----------+--------------+     Summary: BILATERAL: - No evidence of deep vein thrombosis seen in the lower extremities, bilaterally. -No evidence of popliteal cyst, bilaterally.   *See table(s) above for measurements and observations. Electronically signed by Servando Snare MD on 02/02/2022 at 5:13:58 PM.    Final    NM Pulmonary Perfusion  Result Date: 02/02/2022 CLINICAL DATA:   Syncope, shortness of breath EXAM: NUCLEAR MEDICINE PERFUSION LUNG SCAN TECHNIQUE: Perfusion images were obtained in multiple projections after intravenous injection of radiopharmaceutical. Ventilation scans intentionally deferred if perfusion scan and chest x-ray adequate for interpretation during COVID 19 epidemic. RADIOPHARMACEUTICALS:  Four mCi Tc-17mMAA IV COMPARISON:  Previous  studies including the chest radiographs done today and CT chest done on 01/16/2021 FINDINGS: There are multiple subsegmental and segmental foci of decreased perfusion in the mid and lower lung fields on both sides, more so on the left side. There is no infiltrate in the lung fields in the chest radiograph. Evaluation is somewhat limited without ventilation images. IMPRESSION: There are multiple segmental and subsegmental foci of decreased perfusion in both lungs, more so in the left lower lung field. Evaluation is limited without ventilation images. No focal abnormalities are seen in the lung fields in the chest radiograph. Findings suggest intermediate to high probability for pulmonary embolism. If clinically warranted, please consider venous Doppler examination of both lower extremities or CT pulmonary angiogram. Electronically Signed   By: Elmer Picker M.D.   On: 02/02/2022 15:54   DG CHEST PORT 1 VIEW  Result Date: 02/02/2022 CLINICAL DATA:  Short of breath EXAM: PORTABLE CHEST 1 VIEW COMPARISON:  01/29/2022 FINDINGS: No new consolidation or edema. No pleural effusion or pneumothorax. Stable cardiomediastinal contours with normal heart size. Left chest wall dual lead pacemaker. IMPRESSION: No acute process in the chest. Electronically Signed   By: Macy Mis M.D.   On: 02/02/2022 13:00   DG Chest 2 View  Result Date: 01/29/2022 CLINICAL DATA:  Multiple syncopal episodes over the past several weeks. EXAM: CHEST - 2 VIEW COMPARISON:  Chest x-ray dated October 19, 2021. FINDINGS: Unchanged left chest wall pacemaker. The  heart size and mediastinal contours are within normal limits. Normal pulmonary vascularity. No focal consolidation, pleural effusion, or pneumothorax. Multiple skin folds again seen overlying the chest. No acute osseous abnormality. IMPRESSION: No active cardiopulmonary disease. Electronically Signed   By: Titus Dubin M.D.   On: 01/29/2022 17:32   EEG adult  Result Date: 01/28/2022 Cameron Sprang, MD     01/28/2022 11:08 AM ELECTROENCEPHALOGRAM REPORT Date of Study: 01/28/2022 Patient's Name: Cavan Bearden Long MRN: [id number] Date of Birth: 1940/08/21 Referring Provider: Dr. Ellouise Newer Clinical History: This is an 81 year old man with recurrent dizziness, staring episodes, and syncope. EEG for classification. Medications: Aspirin, Questran, Lomotil, Marinol, Florinef, Proamantine, Remeron, Thiamine Technical Summary: A multichannel digital EEG recording measured by the international 10-20 system with electrodes applied with paste and impedances below 5000 ohms performed in our laboratory with EKG monitoring in an awake and asleep patient.  Hyperventilation was not performed. Photic stimulation was performed.  The digital EEG was referentially recorded, reformatted, and digitally filtered in a variety of bipolar and referential montages for optimal display.  Description: The patient is awake and asleep during the recording.  During maximal wakefulness, there is a symmetric, medium voltage 8 Hz posterior dominant rhythm that attenuates with eye opening.  The record is symmetric.  During drowsiness and sleep, there is an increase in theta slowing of the background with vertex waves seen. Photic stimulation did not elicit any abnormalities.  There were no epileptiform discharges or electrographic seizures seen.  EKG lead was unremarkable. Impression: This awake and asleep EEG is normal.  Clinical Correlation: A normal EEG does not exclude a clinical diagnosis of epilepsy.  If further clinical questions remain,  prolonged EEG may be helpful.  Clinical correlation is advised. Ellouise Newer, M.D.   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 EXAM: MRI ABDOMEN WITHOUT CONTRAST TECHNIQUE: Multiplanar multisequence MR imaging was performed without the administration of intravenous contrast. COMPARISON:  11/18/2021 FINDINGS:  Lower chest: No acute findings. Hepatobiliary: Intrinsically T2 hyperintense mass of the central liver dome, hepatic segment VII/VIII measuring 4.2 x 4.1 cm, not significantly changed compared to prior PET-CT accounting for cross modality comparison when measured with similar technique (series 5, image 4). Similar lesion adjacent to the falciform ligament, hepatic segment III/IV B, measuring 1.4 x 1.3 cm, previously noted on PET examination but not clearly visualized by noncontrast CT (series 5, image 15). Benign, lobulated fluid signal cyst of the inferior right lobe of the liver adjacent to the gallbladder fossa (series 5, image 15). No gallstones. No biliary ductal dilatation. Pancreas: No mass, inflammatory changes, or other parenchymal abnormality identified.No pancreatic ductal dilatation. Spleen:  Within normal limits in size and appearance. Adrenals/Urinary Tract: Normal adrenal glands. Intrinsically T2 hypointense, T1 heterogeneous exophytic solid lesion of the posterior midportion of the left kidney measuring 2.6 x 2.2 cm (series 5, image 22). Multiseptated cystic lesion of the posterior midportion of the right kidney measuring 1.9 x 1.2 cm (series 5, image 12, series 4, image 6). Multiple additional simple appearing, fluid signal benign renal cortical cysts bilaterally. Nonobstructive calculus of the inferior pole of the right kidney. No evidence of hydronephrosis. Stomach/Bowel: Visualized portions within the abdomen are unremarkable. Vascular/Lymphatic: No pathologically enlarged lymph nodes identified.  No abdominal aortic aneurysm demonstrated. Other:  Trace perihepatic ascites. Musculoskeletal: No suspicious osseous lesions identified. IMPRESSION: 1. Solid lesion of the posterior midportion of the left kidney measuring 2.6 x 2.2 cm, in keeping with ultrasound findings and suspicious for a renal cell carcinoma. Please note that noncontrast MR is significantly limited for the characterization of abdominal organ lesions, and generally should be evaluated. Stable chronic renal disease is generally not a contraindication to gadolinium contrast administration. 2. Multiseptated cystic lesion of the posterior midportion of the right kidney measuring 1.9 x 1.2 cm, consistent with a complex cyst although incompletely characterized for contrast enhancement, as above. 3. Multiple additional simple appearing benign appearing renal cortical cysts bilaterally, for which no further follow-up or characterization is required. 4. Masses of the liver dome and left lobe of the liver, not significantly changed compared to prior PET-CT, consistent with known metastatic carcinoid. 5. 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 6. Trace perihepatic ascites. Electronically Signed   By: Delanna Ahmadi M.D.   On: 01/26/2022 15:30   MR ANGIO HEAD WO CONTRAST  Result Date: 01/26/2022 CLINICAL DATA:  Episodes of staring. Dizziness. Syncope, unspecified syncope type. EXAM: MRI HEAD WITHOUT CONTRAST MRA HEAD WITHOUT CONTRAST TECHNIQUE: Multiplanar, multi-echo pulse sequences of the brain and surrounding structures were acquired without intravenous contrast. Angiographic images of the Circle of Willis were acquired using MRA technique without intravenous contrast. COMPARISON:  Head CT 04/22/2021. FINDINGS: MRI HEAD FINDINGS Brain: Mild for age generalized parenchymal atrophy. Multifocal T2 FLAIR hyperintense signal abnormality within the cerebral white matter and  pons, nonspecific but compatible with mild chronic small vessel ischemic disease. Prominent perivascular spaces in the bilateral deep gray nuclei. 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 There is no acute infarct. No evidence of an intracranial mass. No chronic intracranial blood products. No extra-axial fluid collection. No midline shift. Vascular: Maintained flow voids within the proximal large arterial vessels. Skull and upper cervical spine: No focal suspicious marrow lesion. Incompletely assessed cervical spondylosis. Sinuses/Orbits: No mass or acute finding within the imaged orbits. Prior bilateral ocular lens replacement. Minimal mucosal thickening within the bilateral ethmoid  and right maxillary sinuses. MRA HEAD FINDINGS Anterior circulation: The intracranial internal carotid arteries are patent. The M1 middle cerebral arteries are patent. No M2 proximal branch occlusion or high-grade proximal stenosis. The anterior cerebral arteries are patent. The right A1 segment is developmentally diminutive or absent. 2-3 mm inferiorly projecting vascular protrusion arising from the supraclinoid left ICA, which may reflect an aneurysm or infundibulum (for instance as seen on series 1038, image 4). 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 Posterior circulation: The intracranial vertebral arteries are patent. The basilar artery is patent. The posterior cerebral arteries are patent. A right posterior communicating artery is present. The left posterior communicating artery is diminutive or absent. Anatomic variants: As described. IMPRESSION: MRI brain: 1. No evidence of acute intracranial abnormality. 2. Mild chronic small vessel ischemic changes within the cerebral white matter and pons (including a chronic pontine lacunar infarct). 3. Mild generalized parenchymal atrophy. MRA head: 1. No intracranial  large vessel occlusion or proximal high-grade arterial stenosis. 2. 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 3. 2-3 mm inferiorly projecting vascular protrusion arising from the supraclinoid left ICA, which may reflect an aneurysm or infundibulum. Electronically Signed   By: Kellie Simmering D.O.   On: 01/26/2022 14:45   MR BRAIN WO CONTRAST  Result Date: 01/26/2022 CLINICAL DATA:  Episodes of staring. Dizziness. Syncope, unspecified syncope type. EXAM: MRI HEAD WITHOUT CONTRAST MRA HEAD WITHOUT CONTRAST TECHNIQUE: Multiplanar, multi-echo pulse sequences of the brain and surrounding structures were acquired without intravenous contrast. Angiographic images of the Circle of Willis were acquired using MRA technique without intravenous contrast. COMPARISON:  Head CT 04/22/2021. FINDINGS: MRI HEAD FINDINGS Brain: Mild for age generalized parenchymal atrophy. Multifocal T2 FLAIR hyperintense signal abnormality within the cerebral white matter and pons, nonspecific but compatible with mild chronic small vessel ischemic disease. Prominent perivascular spaces in the bilateral deep gray nuclei. 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 There is no acute infarct. No evidence of an intracranial mass. No chronic intracranial blood products. No extra-axial fluid collection. No midline shift. Vascular: Maintained flow voids within the proximal large arterial vessels. Skull and upper cervical spine: No focal suspicious marrow lesion. Incompletely assessed cervical spondylosis. Sinuses/Orbits: No mass or acute finding within the imaged orbits. Prior bilateral ocular lens replacement. Minimal mucosal thickening within the bilateral ethmoid and right maxillary sinuses. MRA HEAD FINDINGS Anterior circulation: The intracranial internal carotid arteries are patent. The M1 middle cerebral arteries are patent. No M2 proximal branch occlusion  or high-grade proximal stenosis. The anterior cerebral arteries are patent. The right A1 segment is developmentally diminutive or absent. 2-3 mm inferiorly projecting vascular protrusion arising from the supraclinoid left ICA, which may reflect an aneurysm or infundibulum (for instance as seen on series 1038, image 4). 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 Posterior circulation: The intracranial vertebral arteries are patent. The basilar artery is patent. The posterior cerebral arteries are patent. A right posterior communicating artery is present. The left posterior communicating artery is diminutive or absent. Anatomic variants: As described. IMPRESSION: MRI brain: 1. No evidence of acute intracranial abnormality. 2. Mild chronic small vessel ischemic changes within the cerebral white matter and pons (including a chronic pontine lacunar infarct). 3. Mild generalized parenchymal atrophy. MRA head: 1. No intracranial  large vessel occlusion or proximal high-grade arterial stenosis. 2. 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 3. 2-3 mm inferiorly projecting vascular protrusion arising from the supraclinoid left ICA, which may reflect an aneurysm or infundibulum. Electronically Signed   By: Kellie Simmering D.O.   On: 01/26/2022 14:45   CUP PACEART REMOTE DEVICE CHECK  Result Date: 01/22/2022 Scheduled remote reviewed. Normal device function.  Next remote 91 days. Kathy Breach, RN, CCDS, [employer]  VAS US CAROTID  Result Date: 01/20/2022 Carotid Arterial Duplex Study Patient Name:  NICOLAOS MITRANO [facility]  Date of Exam:   01/20/2022 Medical Rec #: [id number]        Accession #:    [id number] Date of Birth: 04-21-1941         Patient Gender: M Patient Age:   32 years Exam Location:  [facility] Procedure:      VAS US CAROTID  Referring Phys: Ellouise Newer --------------------------------------------------------------------------------  Indications:       Dizziness. Risk Factors:      Hypertension. Comparison Study:  No prior study Performing Technologist: Maudry Mayhew MHA, RDMS, RVT, RDCS  Examination Guidelines: A complete evaluation includes B-mode imaging, spectral Doppler, color Doppler, and power Doppler as needed of all accessible portions of each vessel. Bilateral testing is considered an integral part of a complete examination. Limited examinations for reoccurring indications may be performed as noted.  Right Carotid Findings: +----------+--------+-------+--------+--------------------------------+--------+           PSV cm/sEDV    StenosisPlaque Description              Comments                   cm/s                                                    +----------+--------+-------+--------+--------------------------------+--------+ CCA Prox  52      14                                                      +----------+--------+-------+--------+--------------------------------+--------+ CCA Distal39      9              focal, smooth and calcific               +----------+--------+-------+--------+--------------------------------+--------+ ICA Prox  35      10             smooth, heterogenous and                                                  calcific                                 +----------+--------+-------+--------+--------------------------------+--------+ ICA Distal55      20                                                      +----------+--------+-------+--------+--------------------------------+--------+  ECA       43      8                                                       +----------+--------+-------+--------+--------------------------------+--------+ +----------+--------+-------+----------------+-------------------+           PSV cm/sEDV cmsDescribe         Arm Pressure (mmHG) +----------+--------+-------+----------------+-------------------+ [id number]             Multiphasic, WNL                    +----------+--------+-------+----------------+-------------------+ +---------+--------+--+--------+--+---------+ VertebralPSV cm/s52EDV cm/s15Antegrade +---------+--------+--+--------+--+---------+  Left Carotid Findings: +----------+--------+-------+--------+--------------------------------+--------+           PSV cm/sEDV    StenosisPlaque Description              Comments                   cm/s                                                    +----------+--------+-------+--------+--------------------------------+--------+ CCA Prox  47      17             smooth and heterogenous                  +----------+--------+-------+--------+--------------------------------+--------+ CCA Distal42      14             smooth and heterogenous                  +----------+--------+-------+--------+--------------------------------+--------+ ICA Prox  29      15             smooth and heterogenous                  +----------+--------+-------+--------+--------------------------------+--------+ ICA Distal87      36                                                      +----------+--------+-------+--------+--------------------------------+--------+ ECA       34      5              smooth, heterogenous and                                                  calcific                                 +----------+--------+-------+--------+--------------------------------+--------+ +----------+--------+--------+----------------+-------------------+           PSV cm/sEDV cm/sDescribe        Arm Pressure (mmHG) +----------+--------+--------+----------------+-------------------+ [id number]              Multiphasic, WNL                    +----------+--------+--------+----------------+-------------------+  +---------+--------+--+--------+--+---------+  VertebralPSV cm/s56EDV cm/s18Antegrade +---------+--------+--+--------+--+---------+   Summary: Right Carotid: Velocities in the right ICA are consistent with a 1-39% stenosis. Left Carotid: Velocities in the left ICA are consistent with a 1-39% stenosis. Vertebrals:  Bilateral vertebral arteries demonstrate antegrade flow. Subclavians: Normal flow hemodynamics were seen in bilateral subclavian              arteries. *See table(s) above for measurements and observations.  Electronically signed by Deitra Mayo MD on 01/20/2022 at 2:16:21 PM.    Final      Discharge Exam: Vitals:   02/03/22 0822 02/03/22 0824  BP:  (!) 171/90  Pulse:  65  Resp:  20  Temp:    SpO2: 97% 96%   Vitals:   02/03/22 0615 02/03/22 0724 02/03/22 0822 02/03/22 0824  BP: (!) 184/98 136/86  (!) 171/90  Pulse:  77  65  Resp:  20  20  Temp:  98.1 F (36.7 C)    TempSrc:  Oral    SpO2:  97% 97% 96%  Weight:      Height:        General: Pt is alert, awake, not in acute distress Cardiovascular: Irregularly irregular rate rhythm, S1/S2 +, no rubs, no gallops Respiratory: CTA bilaterally, no wheezing, no rhonchi Abdominal: Soft, NT, ND, bowel sounds + Extremities: no edema, no cyanosis    The results of significant diagnostics from this hospitalization (including imaging, microbiology, ancillary and laboratory) are listed below for reference.     Microbiology: Recent Results (from the past 240 hour(s))  WOUND CULTURE     Status: None   Collection Time: 01/28/22 12:31 PM   Specimen: Wound  Result Value Ref Range Status   Source: R UPPER BACK  Final   Status: FINAL  Final   Gram Stain:   Final    Moderate White blood cells seen Rare epithelial cells Few Gram positive cocci in clusters   RESULT:   Final    Growth of skin flora (note: Growth does not include S. aureus, beta-hemolytic Streptococci or P. aeruginosa).     Labs: BNP (last 3 results) No  results for input(s): "BNP" in the last 8760 hours. Basic Metabolic Panel: Recent Labs  Lab 01/30/22 0256 01/31/22 0348 02/01/22 1748 02/01/22 2157 02/02/22 0300 02/02/22 1220  NA 141 140 145  --  145 142  K 4.3 3.9 3.5  --  2.9* 3.7  CL 111 110 115*  --  120* 112*  CO2 21* 21* 23  --  19* 22  GLUCOSE 104* 107* 131*  --  103* 130*  BUN 47* 45* 42*  --  36* 39*  CREATININE 3.37* 3.23* 3.18* 3.02* 2.47* 2.65*  CALCIUM 8.9 8.7* 8.6*  --  7.1* 8.9  MG  --   --   --  1.5*  --  2.0   Liver Function Tests: Recent Labs  Lab 01/27/22 1510 01/29/22 1659 02/01/22 1748 02/02/22 0300  AST 14* 18 17 13*  ALT '13 16 12 11  '$ ALKPHOS 48 50 44 34*  BILITOT 0.6 0.7 0.7 0.7  PROT 7.3 6.6 6.3* 4.8*  ALBUMIN 4.2 3.8 3.6 2.6*   Recent Labs  Lab 01/29/22 1659  LIPASE 36   No results for input(s): "AMMONIA" in the last 168 hours. CBC: Recent Labs  Lab 01/27/22 1510 01/29/22 1659 01/29/22 2223 01/31/22 0348 02/01/22 1748 02/01/22 2157 02/02/22 0300 02/02/22 1220  WBC 7.0 7.4   < > 8.9 9.2 9.0 7.2 9.3  NEUTROABS 4.7 5.9  --   --  7.8*  --   --   --   HGB 10.0* 9.5*   < > 9.3* 9.2* 8.8* 7.2* 8.9*  HCT 28.6* 27.1*   < > 27.2* 26.0* 24.8* 20.7* 26.0*  MCV 87.5 87.7   < > 88.3 87.8 87.9 89.6 88.4  PLT 196 185   < > 153 151 142* 121* 145*   < > = values in this interval not displayed.   Cardiac Enzymes: No results for input(s): "CKTOTAL", "CKMB", "CKMBINDEX", "TROPONINI" in the last 168 hours. BNP: Invalid input(s): "POCBNP" CBG: Recent Labs  Lab 01/31/22 1112 02/01/22 2146  GLUCAP 102* 101*   D-Dimer Recent Labs    02/01/22 2157  DDIMER 1.47*   Hgb A1c No results for input(s): "HGBA1C" in the last 72 hours. Lipid Profile No results for input(s): "CHOL", "HDL", "LDLCALC", "TRIG", "CHOLHDL", "LDLDIRECT" in the last 72 hours. Thyroid function studies No results for input(s): "TSH", "T4TOTAL", "T3FREE", "THYROIDAB" in the last 72 hours.  Invalid input(s):  "FREET3" Anemia work up Recent Labs    02/02/22 0300 02/02/22 1118  VITAMINB12  --  251  FOLATE  --  16.4  FERRITIN  --  122  TIBC  --  214*  IRON  --  65  RETICCTPCT 2.1  --    Urinalysis    Component Value Date/Time   COLORURINE YELLOW 02/01/2022 1912   APPEARANCEUR HAZY (A) 02/01/2022 1912   LABSPEC 1.011 02/01/2022 1912   PHURINE 5.0 02/01/2022 1912   GLUCOSEU NEGATIVE 02/01/2022 1912   HGBUR NEGATIVE 02/01/2022 1912   BILIRUBINUR NEGATIVE 02/01/2022 1912   KETONESUR NEGATIVE 02/01/2022 1912   PROTEINUR 100 (A) 02/01/2022 1912   UROBILINOGEN 0.2 08/23/2012 2225   NITRITE NEGATIVE 02/01/2022 1912   LEUKOCYTESUR NEGATIVE 02/01/2022 1912   Sepsis Labs Recent Labs  Lab 02/01/22 1748 02/01/22 2157 02/02/22 0300 02/02/22 1220  WBC 9.2 9.0 7.2 9.3   Microbiology Recent Results (from the past 240 hour(s))  WOUND CULTURE     Status: None   Collection Time: 01/28/22 12:31 PM   Specimen: Wound  Result Value Ref Range Status   Source: R UPPER BACK  Final   Status: FINAL  Final   Gram Stain:   Final    Moderate White blood cells seen Rare epithelial cells Few Gram positive cocci in clusters   RESULT:   Final    Growth of skin flora (note: Growth does not include S. aureus, beta-hemolytic Streptococci or P. aeruginosa).     Time coordinating discharge: Over 30 minutes  SIGNED:   Darliss Cheney, MD  [facility] 02/03/2022, 10:28 AM *Please note that this is a verbal dictation therefore any spelling or grammatical errors are due to the "[employer]" system interpretation. If 7PM-7AM, please contact night-coverage [email or web address]

## 2022-02-03 NOTE — TOC Initial Note (Addendum)
Transition of Care [facility]) - Initial/Assessment Note    Patient Details  Name: Alan [name] MRN: [id number] Date of Birth: April 29, 1941  [facility] CM/SW Contact:    Milas Gain, [place] Phone Number: 02/03/2022, 3:11 PM  Clinical Narrative:                  CSW received consult for possible SNF placement at time of discharge. CSW spoke with patient at bedside regarding PT recommendation of SNF placement at time of discharge. Patient reports he comes from home with spouse and son. Patient expressed understanding of PT recommendation and is agreeable to SNF placement at time of discharge. Patient gave CSW permission to fax out initial referral near the [place] area. Patients number one SNF choice is [facility]. CSW discussed insurance authorization process with patient.Patient reports he has  received the COVID vaccines as well as 1 booster. Patient request to follow up with spouse with SNF bed offers.CSW started Lexicographer # [id number]. No further questions reported at this time. CSW to continue to follow and assist with discharge planning needs.   Update- CSW provided patients spouse with SNF bed offers. Patients spouse accepted SNF bed offer with [facility] and rehab for patient. CSW spoke with Nicki who confirmed SNF bed for patient. Patients insurance authorization currently pending.  Expected Discharge Plan: Skilled Nursing Facility Barriers to Discharge: Continued Medical Work up   Patient Goals and CMS Choice Patient states their goals for this hospitalization and ongoing recovery are:: SNF CMS Medicare.gov Compare Post Acute Care list provided to:: Patient Choice offered to / list presented to : Patient  Expected Discharge Plan and Services Expected Discharge Plan: [place] In-house Referral: Clinical Social Work     Living arrangements for the past 2 months: Single Family Home Expected Discharge Date: 02/03/22                                     Prior Living Arrangements/Services Living arrangements for the past 2 months: Single Family Home Lives with:: Spouse, Adult Children (Lives with spouse and son) Patient language and need for interpreter reviewed:: Yes Do you feel safe going back to the place where you live?: No   SNF  Need for Family Participation in Patient Care: Yes (Comment) Care giver support system in place?: Yes (comment)   Criminal Activity/Legal Involvement Pertinent to Current Situation/Hospitalization: No - Comment as needed  Activities of Daily Living Home Assistive Devices/Equipment: Eyeglasses, Environmental consultant (specify type), Wheelchair, Radio producer (specify quad or straight), Bedside commode/3-in-1 ADL Screening (condition at time of admission) Patient's cognitive ability adequate to safely complete daily activities?: Yes Is the patient deaf or have difficulty hearing?: Yes Does the patient have difficulty seeing, even when wearing glasses/contacts?: No Does the patient have difficulty concentrating, remembering, or making decisions?: Yes Patient able to express need for assistance with ADLs?: Yes Does the patient have difficulty dressing or bathing?: No Independently performs ADLs?: Yes (appropriate for developmental age) Does the patient have difficulty walking or climbing stairs?: Yes Weakness of Legs: Both Weakness of Arms/Hands: None  Permission Sought/Granted Permission sought to share information with : Case Manager, Family Supports, Chartered certified accountant granted to share information with : Yes, Verbal Permission Granted  Share Information with NAME: [name]  Permission granted to share info w AGENCY: SNF  Permission granted to share info w Relationship: spouse  Permission granted  to share info w Contact Information: Ilda Foil [phone]  Emotional Assessment Appearance:: Appears stated age Attitude/Demeanor/Rapport: Gracious Affect (typically  observed): Calm Orientation: : Oriented to Self, Oriented to Place, Oriented to  Time, Oriented to Situation Alcohol / Substance Use: Not Applicable Psych Involvement: No (comment)  Admission diagnosis:  Syncope and collapse [R55] Syncope [R55] Patient Active Problem List   Diagnosis Date Noted   CKD (chronic kidney disease), stage IV ([place]) 02/02/2022   Acute on chronic anemia 02/02/2022   Syncope 02/01/2022   Paroxysmal atrial fibrillation ([place]) 01/31/2022   History of pulmonary embolism 01/31/2022   Hypotension 01/31/2022   Acute kidney injury superimposed on chronic kidney disease ([place]) 01/28/2022   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

## 2022-02-03 NOTE — Evaluation (Addendum)
Physical Therapy Evaluation Patient Details Name: Alan [name] MRN: [id number] DOB: 12-Feb-1941 Today's Date: 02/03/2022  History of Present Illness  Pt is an 81 y/o male admitted secondary to syncope likely from orthostatic hypotension. Recent admission for the same. PMH includes CKD, a fib, carcinoid of intestine, pacemaker, and PE.  Clinical Impression  Pt admitted secondary to problem above with deficits below. Pt requiring min guard A initially to transfer to [facility], however, when transferring back to bed, pt extremely orthostatic and began to have tremors requiring min A. BP below. Per pt's wife, she is having difficulty caring for pt at home and interested in SNF level therapies. Feel pt would benefit to increase independence and safety. Will continue to follow acutely.      BP in supine : 183/104 BP in sitting: 117/89 BP in standing: 101/68 BP following transfer to [facility]: 113/78 BP following transfer back to bed following BM: 68/51 BP upon return to supine: 88/65 BP following trendelenberg: 143/82   Recommendations for follow up therapy are one component of a multi-disciplinary discharge planning process, led by the attending physician.  Recommendations may be updated based on patient status, additional functional criteria and insurance authorization.  Follow Up Recommendations Skilled nursing-short term rehab (<3 hours/day) Can patient physically be transported by private vehicle: Yes    Assistance Recommended at Discharge Frequent or constant Supervision/Assistance  Patient can return home with the following  A little help with walking and/or transfers;Assistance with cooking/housework;Assist for transportation;Help with stairs or ramp for entrance;A little help with bathing/dressing/bathroom    Equipment Recommendations None recommended by PT  Recommendations for Other Services       Functional Status Assessment Patient has had a recent decline in their functional status and  demonstrates the ability to make significant improvements in function in a reasonable and predictable amount of time.     Precautions / Restrictions Precautions Precautions: Fall;Other (comment) Precaution Comments: syncope Restrictions Weight Bearing Restrictions: No      Mobility  Bed Mobility Overal bed mobility: Needs Assistance Bed Mobility: Supine to Sit, Sit to Supine     Supine to sit: Min guard Sit to supine: Min assist   General bed mobility comments: Assist for LE back onto bed    Transfers Overall transfer level: Needs assistance Equipment used: 1 person hand held assist Transfers: Sit to/from Stand, Bed to chair/wheelchair/BSC Sit to Stand: Min guard, Min assist Stand pivot transfers: Min assist, Min guard         General transfer comment: Pt able to stand initially with min guard A for orthostatics. Noted drop, but pt reports asymptomatic and wanted to transfer to [facility]. Required min guard for safety initially. Following toileting, pt with increased weakness and shakiness and required min A to return to bed. BP checked and was at 68/51 mmHg. RN and MD notified. Returned to 88/65 upon return to supine and 143/82 with trendelenberg positioning.    Ambulation/Gait                  Stairs            Wheelchair Mobility    Modified Rankin (Stroke Patients Only)       Balance Overall balance assessment: Needs assistance Sitting-balance support: No upper extremity supported, Feet supported Sitting balance-Leahy Scale: Good     Standing balance support: No upper extremity supported Standing balance-Leahy Scale: Fair  Pertinent Vitals/Pain Pain Assessment Pain Assessment: No/denies pain    Home Living Family/patient expects to be discharged to:: Private residence Living Arrangements: Spouse/significant other Available Help at Discharge: Family;Available 24 hours/day Type of Home: House Home  Access: Stairs to enter Entrance Stairs-Rails: Can reach both Entrance Stairs-Number of Steps: 5   Home Layout: One level Home Equipment: Shower seat - built in;Cane - single point;Wheelchair Probation officer (4 wheels);Rolling Walker (2 wheels);BSC/3in1;Grab bars - tub/shower      Prior Function Prior Level of Function : Needs assist             Mobility Comments: uses cane intermittently for balance ADLs Comments: sits to shower with wife supervising, takes cool shower to prevent vasovagal episodes     Hand Dominance        Extremity/Trunk Assessment   Upper Extremity Assessment Upper Extremity Assessment: Defer to OT evaluation    Lower Extremity Assessment Lower Extremity Assessment: Generalized weakness    Cervical / Trunk Assessment Cervical / Trunk Assessment: Kyphotic  Communication   Communication: No difficulties  Cognition Arousal/Alertness: Awake/alert Behavior During Therapy: WFL for tasks assessed/performed Overall Cognitive Status: Within Functional Limits for tasks assessed                                 General Comments: Decreased safety awareness, but likely pt's baseline        General Comments General comments (skin integrity, edema, etc.): Pt's wife reporting increased concern about pt returning home. Reports she cannot care for him.    Exercises     Assessment/Plan    PT Assessment Patient needs continued PT services  PT Problem List Decreased strength;Decreased activity tolerance;Decreased balance;Decreased mobility;Decreased knowledge of use of DME;Cardiopulmonary status limiting activity;Decreased safety awareness       PT Treatment Interventions DME instruction;Gait training;Stair training;Functional mobility training;Therapeutic activities;Therapeutic exercise;Balance training;Patient/family education    PT Goals (Current goals can be found in the Care Plan section)  Acute Rehab PT Goals Patient Stated Goal: to  get stronger before going home per wife PT Goal Formulation: With patient/family Time For Goal Achievement: 02/17/22 Potential to Achieve Goals: Fair    Frequency Min 2X/week     Co-evaluation               AM-PAC PT "6 Clicks" Mobility  Outcome Measure Help needed turning from your back to your side while in a flat bed without using bedrails?: A Little Help needed moving from lying on your back to sitting on the side of a flat bed without using bedrails?: A Little Help needed moving to and from a bed to a chair (including a wheelchair)?: A Little Help needed standing up from a chair using your arms (e.g., wheelchair or bedside chair)?: A Little Help needed to walk in hospital room?: Total Help needed climbing 3-5 steps with a railing? : Total 6 Click Score: 14    End of Session Equipment Utilized During Treatment: Gait belt Activity Tolerance: Treatment limited secondary to medical complications (Comment) (+ orthostatics) Patient left: in bed;with call bell/phone within reach;with bed alarm set;with family/visitor present Nurse Communication: Mobility status;Other (comment) (+ orthostatics) PT Visit Diagnosis: History of falling (Z91.81);Repeated falls (R29.6);Muscle weakness (generalized) (M62.81)    Time: 0932-6712 PT Time Calculation (min) (ACUTE ONLY): 32 min   Charges:   PT Evaluation $PT Eval Moderate Complexity: 1 Mod PT Treatments $Therapeutic Activity: 8-22 mins  Lou Miner, DPT  Acute Rehabilitation Services  Office: [phone]   Rudean Hitt 02/03/2022, 12:42 PM

## 2022-02-04 DIAGNOSIS — I951 Orthostatic hypotension: Secondary | ICD-10-CM | POA: Diagnosis not present

## 2022-02-04 DIAGNOSIS — D649 Anemia, unspecified: Secondary | ICD-10-CM

## 2022-02-04 DIAGNOSIS — N184 Chronic kidney disease, stage 4 (severe): Secondary | ICD-10-CM | POA: Diagnosis not present

## 2022-02-04 DIAGNOSIS — R55 Syncope and collapse: Secondary | ICD-10-CM | POA: Diagnosis not present

## 2022-02-04 NOTE — Discharge Summary (Addendum)
Physician Discharge Summary  Alan [name] ZOX:[id number] DOB: 1940-08-27 DOA: 02/01/2022  PCP: [name], MD  Admit date: 02/01/2022 Discharge date: 02/04/2022  Admitted From: Home Disposition:  SNF [facility]  Recommendations for Outpatient Follow-up:  Follow up with PCP in 1-2 weeks Please obtain BMP/CBC in one week daily dressing changes (pack w/ iodoform gauze, cover w/ dry gauze and foam)  Home Health:No Equipment/Devices:None  Discharge Condition:Stable CODE STATUS:Full Diet recommendation: Heart Healthy  Brief/Interim Summary: 81 y.o. male with medical history significant of  intestinal carcinoid, chronic diarrhea, CKD stage 3, anemia, paroxysmal atrial fibrillation, pulmonary embolism, tachy brady syndrome sp pacemaker, orthostatic hypotension, who was recently discharged today after  hospital stay 8/3-8/6  for recurrent syncope above his baseline due to progressive orthostatic hypotension. Patient was treated with ivfs with improvement and per wife only had one episode of syncope while in house which is close to his baseline. He was discharged on midodrine and florinef.  Patient states day of discharge he did not feel well. He describes feeling as feeling heavy. He states he did not mention this because he wanted to go home. However at home he continue to feel unwell and had two syncopal episodes with bp 60 systolic in the field per notes.  Patient currently states he has been complaint with his medications, he notes he did have episode of loose stools and note he did not eat well. He notes no chest pain , sob,  n/v/. He does endorse weight loss.   Discharge Diagnoses:  Active Problems:   Orthostatic hypotension   Paroxysmal atrial fibrillation (HCC)   Syncope   CKD (chronic kidney disease), stage IV (HCC)   Acute on chronic anemia  Syncope due to orthostatic hypotension: With an ongoing recurring mention patient was recently discharged on 02/01/2022 come back to the  hospital half a day later with similar symptoms during this time he was discharged on Florinef and midodrine and clearly instructed to take his medication morning was his blood pressure systolic was greater than 190. Patient was frustrated and demanding a cure he was instructed by the previous physician that there is a combination of his carcinoid tumor, tachybradycardia syndrome and atrial fibrillation. He is currently on lanreotide every 4 weeksx 6. Realistic expectation were discussed with the patient.  History of atrial fibrillation: Rate control not on anticoagulation due to history of GI bleed.  Anemia of chronic disease: Hemoglobin has remained close to baseline.  Hypomagnesemia/hypokalemia: Repleted now resolved.  History of PE: Lower extremity Doppler was negative for DVT VQ scan showed intermediate to high probability PE, CT angio of the chest cannot be done due to his chronic kidney disease stage IV. The previous physician spoke with the patient that he clinically does not appear to have a PE especially in the lower extremity DVT has been ruled out he had a very lengthy discussion with the patient and the wife about the risk and benefits of being on anticoagulation versus not being in the light of history of recent GI bleed.  The patient the wife and the previous physician made a mutual decision not to start him on anticoagulation and watch closely for symptoms.  He was made aware that if he develops significant tachycardia chest pain or shortness of breath he needs to seek immediate medical attention.  Chronic kidney disease stage IV: Left renal mass follow-up with urology as an outpatient his creatinine remained at baseline.  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  Discharge Instructions   Allergies as of 02/04/2022       Reactions   Penicillins Hives, Shortness Of Breath   Lisinopril  Itching        Medication List     TAKE these medications    albuterol 108 (90 Base) MCG/ACT inhaler Commonly known as: VENTOLIN HFA Inhale 2 puffs into the lungs every 6 (six) hours as needed for wheezing or shortness of breath.   aspirin EC 81 MG tablet Take 81 mg by mouth daily. Swallow whole.   diphenoxylate-atropine 2.5-0.025 MG tablet Commonly known as: LOMOTIL Take 1 tablet by mouth 2 (two) times daily as needed for diarrhea or loose stools.   feeding supplement (ENSURE COMPLETE) Liqd Take 237 mLs by mouth 2 (two) times daily between meals.   fludrocortisone 0.1 MG tablet Commonly known as: FLORINEF Take 1 tablet (0.1 mg total) by mouth daily.   Imodium A-D 2 MG capsule Generic drug: loperamide Take 2 mg by mouth daily as needed for diarrhea or loose stools.   iron polysaccharides 150 MG capsule Commonly known as: NIFEREX TAKE 1 CAPSULE(150 MG) BY MOUTH 3 TIMES A WEEK What changed: See the new instructions.   LANREOTIDE ACETATE [place] Inject 1 Dose into the skin every 30 (thirty) days.   midodrine 5 MG tablet Commonly known as: PROAMATINE Take 3 tablets (15 mg total) by mouth 3 (three) times daily with meals.   mirtazapine 30 MG tablet Commonly known as: REMERON Take 1 tablet (30 mg total) by mouth daily.   multivitamin Tabs tablet Take 1 tablet by mouth daily.   Potassium Chloride ER 20 MEQ Tbcr Take 1 tablet by mouth 2 (two) times daily.   PRESERVISION AREDS 2 PO Take 1 capsule by mouth daily.   Symbicort 160-4.5 MCG/ACT inhaler Generic drug: budesonide-formoterol Inhale 2 puffs into the lungs daily as needed (wheezing).   thiamine 50 MG tablet Commonly known as: VITAMIN B-1 Take 1 tablet (50 mg total) by mouth daily.        Follow-up Information     [name], MD Follow up in 1 week(s).   Specialty: Internal Medicine Contact information: [place] [address] [phone]         Sueanne Margarita, MD .    Specialty: Cardiology Contact information: [phone] [address] [place] [address] [phone]         Constance Haw, MD .   Specialty: Cardiology Contact information: [address] [place] [address] [phone]                Allergies  Allergen Reactions   Penicillins Hives and Shortness Of Breath   Lisinopril Itching    Consultations: Nephrology   Procedures/Studies: VAS [place] LOWER EXTREMITY VENOUS (DVT)  Result Date: 02/02/2022  Lower Venous DVT Study Patient Name:  Alan [name] [facility]  Date of Exam:   02/02/2022 Medical Rec #: [id number]        Accession #:    [id number] Date of Birth: [birth year]         Patient Gender: M Patient Age:   5 years Exam Location:  [facility] Procedure:      VAS [place] LOWER EXTREMITY VENOUS (DVT) Referring Phys: RAVI PAHWANI --------------------------------------------------------------------------------  Indications: Edema.  Comparison Study: prior 01/19/21 Performing Technologist: Archie Patten RVS  Examination Guidelines: A complete evaluation includes B-mode imaging, spectral Doppler, color Doppler, and power  Doppler as needed of all accessible portions of each vessel. Bilateral testing is considered an integral part of a complete examination. Limited examinations for reoccurring indications may be performed as noted. The reflux portion of the exam is performed with the patient in reverse Trendelenburg.  +---------+---------------+---------+-----------+----------+--------------+ RIGHT    CompressibilityPhasicitySpontaneityPropertiesThrombus Aging +---------+---------------+---------+-----------+----------+--------------+ CFV      Full           Yes      Yes                                 +---------+---------------+---------+-----------+----------+--------------+ SFJ      Full                                                         +---------+---------------+---------+-----------+----------+--------------+ FV Prox  Full                                                        +---------+---------------+---------+-----------+----------+--------------+ FV Mid   Full                                                        +---------+---------------+---------+-----------+----------+--------------+ FV DistalFull                                                        +---------+---------------+---------+-----------+----------+--------------+ PFV      Full                                                        +---------+---------------+---------+-----------+----------+--------------+ POP      Full           Yes      Yes                                 +---------+---------------+---------+-----------+----------+--------------+ PTV      Full                                                        +---------+---------------+---------+-----------+----------+--------------+ PERO     Full                                                        +---------+---------------+---------+-----------+----------+--------------+   +---------+---------------+---------+-----------+----------+--------------+  LEFT     CompressibilityPhasicitySpontaneityPropertiesThrombus Aging +---------+---------------+---------+-----------+----------+--------------+ CFV      Full           Yes      Yes                                 +---------+---------------+---------+-----------+----------+--------------+ SFJ      Full                                                        +---------+---------------+---------+-----------+----------+--------------+ FV Prox  Full                                                        +---------+---------------+---------+-----------+----------+--------------+ FV Mid   Full                                                         +---------+---------------+---------+-----------+----------+--------------+ FV DistalFull                                                        +---------+---------------+---------+-----------+----------+--------------+ PFV      Full                                                        +---------+---------------+---------+-----------+----------+--------------+ POP      Full           Yes      Yes                                 +---------+---------------+---------+-----------+----------+--------------+ PTV      Full                                                        +---------+---------------+---------+-----------+----------+--------------+ PERO     Full                                                        +---------+---------------+---------+-----------+----------+--------------+     Summary: BILATERAL: - No evidence of deep vein thrombosis seen in the lower extremities, bilaterally. -No evidence of popliteal cyst, bilaterally.   *See table(s) above for measurements and observations. Electronically signed by Servando Snare MD on 02/02/2022 at 5:13:58 PM.  Final    NM Pulmonary Perfusion  Result Date: 02/02/2022 CLINICAL DATA:  Syncope, shortness of breath EXAM: NUCLEAR MEDICINE PERFUSION LUNG SCAN TECHNIQUE: Perfusion images were obtained in multiple projections after intravenous injection of radiopharmaceutical. Ventilation scans intentionally deferred if perfusion scan and chest x-ray adequate for interpretation during COVID 19 epidemic. RADIOPHARMACEUTICALS:  Four mCi Tc-26mMAA IV COMPARISON:  Previous studies including the chest radiographs done today and CT chest done on 01/16/2021 FINDINGS: There are multiple subsegmental and segmental foci of decreased perfusion in the mid and lower lung fields on both sides, more so on the left side. There is no infiltrate in the lung fields in the chest radiograph. Evaluation is somewhat limited without ventilation images.  IMPRESSION: There are multiple segmental and subsegmental foci of decreased perfusion in both lungs, more so in the left lower lung field. Evaluation is limited without ventilation images. No focal abnormalities are seen in the lung fields in the chest radiograph. Findings suggest intermediate to high probability for pulmonary embolism. If clinically warranted, please consider venous Doppler examination of both lower extremities or CT pulmonary angiogram. Electronically Signed   By: PElmer PickerM.D.   On: 02/02/2022 15:54   DG CHEST PORT 1 VIEW  Result Date: 02/02/2022 CLINICAL DATA:  Short of breath EXAM: PORTABLE CHEST 1 VIEW COMPARISON:  01/29/2022 FINDINGS: No new consolidation or edema. No pleural effusion or pneumothorax. Stable cardiomediastinal contours with normal heart size. Left chest wall dual lead pacemaker. IMPRESSION: No acute process in the chest. Electronically Signed   By: PMacy MisM.D.   On: 02/02/2022 13:00   DG Chest 2 View  Result Date: 01/29/2022 CLINICAL DATA:  Multiple syncopal episodes over the past several weeks. EXAM: CHEST - 2 VIEW COMPARISON:  Chest x-ray dated October 19, 2021. FINDINGS: Unchanged left chest wall pacemaker. The heart size and mediastinal contours are within normal limits. Normal pulmonary vascularity. No focal consolidation, pleural effusion, or pneumothorax. Multiple skin folds again seen overlying the chest. No acute osseous abnormality. IMPRESSION: No active cardiopulmonary disease. Electronically Signed   By: WTitus DubinM.D.   On: 01/29/2022 17:32   EEG adult  Result Date: 01/28/2022 ACameron Sprang MD     01/28/2022 11:08 AM ELECTROENCEPHALOGRAM REPORT Date of Study: 01/28/2022 Patient's Name: LWestley [name] MRN: [id number] of Birth: 3July 05, 1942Referring Provider: Dr. KEllouise NewerClinical History: This is an 81year old man with recurrent dizziness, staring episodes, and syncope. EEG for classification. Medications: Aspirin, Questran,  Lomotil, Marinol, Florinef, Proamantine, Remeron, Thiamine Technical Summary: A multichannel digital EEG recording measured by the international 10-20 system with electrodes applied with paste and impedances below 5000 ohms performed in our laboratory with EKG monitoring in an awake and asleep patient.  Hyperventilation was not performed. Photic stimulation was performed.  The digital EEG was referentially recorded, reformatted, and digitally filtered in a variety of bipolar and referential montages for optimal display.  Description: The patient is awake and asleep during the recording.  During maximal wakefulness, there is a symmetric, medium voltage 8 Hz posterior dominant rhythm that attenuates with eye opening.  The record is symmetric.  During drowsiness and sleep, there is an increase in theta slowing of the background with vertex waves seen. Photic stimulation did not elicit any abnormalities.  There were no epileptiform discharges or electrographic seizures seen.  EKG lead was unremarkable. Impression: This awake and asleep EEG is normal.  Clinical Correlation: A normal EEG does not exclude a clinical diagnosis of  epilepsy.  If further clinical questions remain, prolonged EEG may be helpful.  Clinical correlation is advised. Ellouise Newer, M.D.   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 EXAM: MRI ABDOMEN WITHOUT CONTRAST TECHNIQUE: Multiplanar multisequence MR imaging was performed without the administration of intravenous contrast. COMPARISON:  11/18/2021 FINDINGS: Lower chest: No acute findings. Hepatobiliary: Intrinsically T2 hyperintense mass of the central liver dome, hepatic segment VII/VIII measuring 4.2 x 4.1 cm, not significantly changed compared to prior PET-CT accounting for cross modality comparison when measured with similar technique (series 5, image 4). Similar lesion adjacent to the falciform  ligament, hepatic segment III/IV B, measuring 1.4 x 1.3 cm, previously noted on PET examination but not clearly visualized by noncontrast CT (series 5, image 15). Benign, lobulated fluid signal cyst of the inferior right lobe of the liver adjacent to the gallbladder fossa (series 5, image 15). No gallstones. No biliary ductal dilatation. Pancreas: No mass, inflammatory changes, or other parenchymal abnormality identified.No pancreatic ductal dilatation. Spleen:  Within normal limits in size and appearance. Adrenals/Urinary Tract: Normal adrenal glands. Intrinsically T2 hypointense, T1 heterogeneous exophytic solid lesion of the posterior midportion of the left kidney measuring 2.6 x 2.2 cm (series 5, image 22). Multiseptated cystic lesion of the posterior midportion of the right kidney measuring 1.9 x 1.2 cm (series 5, image 12, series 4, image 6). Multiple additional simple appearing, fluid signal benign renal cortical cysts bilaterally. Nonobstructive calculus of the inferior pole of the right kidney. No evidence of hydronephrosis. Stomach/Bowel: Visualized portions within the abdomen are unremarkable. Vascular/Lymphatic: No pathologically enlarged lymph nodes identified. No abdominal aortic aneurysm demonstrated. Other:  Trace perihepatic ascites. Musculoskeletal: No suspicious osseous lesions identified. IMPRESSION: 1. Solid lesion of the posterior midportion of the left kidney measuring 2.6 x 2.2 cm, in keeping with ultrasound findings and suspicious for a renal cell carcinoma. Please note that noncontrast MR is significantly limited for the characterization of abdominal organ lesions, and generally should be evaluated. Stable chronic renal disease is generally not a contraindication to gadolinium contrast administration. 2. Multiseptated cystic lesion of the posterior midportion of the right kidney measuring 1.9 x 1.2 cm, consistent with a complex cyst although incompletely characterized for contrast  enhancement, as above. 3. Multiple additional simple appearing benign appearing renal cortical cysts bilaterally, for which no further follow-up or characterization is required. 4. Masses of the liver dome and left lobe of the liver, not significantly changed compared to prior PET-CT, consistent with known metastatic carcinoid. 5. 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 6. Trace perihepatic ascites. Electronically Signed   By: Delanna Ahmadi M.D.   On: 01/26/2022 15:30   MR ANGIO HEAD WO CONTRAST  Result Date: 01/26/2022 CLINICAL DATA:  Episodes of staring. Dizziness. Syncope, unspecified syncope type. EXAM: MRI HEAD WITHOUT CONTRAST MRA HEAD WITHOUT CONTRAST TECHNIQUE: Multiplanar, multi-echo pulse sequences of the brain and surrounding structures were acquired without intravenous contrast. Angiographic images of the Circle of Willis were acquired using MRA technique without intravenous contrast. COMPARISON:  Head CT 04/22/2021. FINDINGS: MRI HEAD FINDINGS Brain: Mild for age generalized parenchymal atrophy. Multifocal T2 FLAIR hyperintense signal abnormality within the cerebral white matter and pons, nonspecific but compatible with mild chronic small vessel ischemic disease. Prominent perivascular spaces in the bilateral deep gray nuclei. 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 There is no acute infarct.  No evidence of an intracranial mass. No chronic intracranial blood products. No extra-axial fluid collection. No midline shift. Vascular: Maintained flow voids within the proximal large arterial vessels. Skull and upper cervical spine: No focal suspicious marrow lesion. Incompletely assessed cervical spondylosis. Sinuses/Orbits: No mass or acute finding within the imaged orbits. Prior bilateral ocular lens replacement. Minimal mucosal thickening within the bilateral ethmoid and right maxillary sinuses. MRA HEAD  FINDINGS Anterior circulation: The intracranial internal carotid arteries are patent. The M1 middle cerebral arteries are patent. No M2 proximal branch occlusion or high-grade proximal stenosis. The anterior cerebral arteries are patent. The right A1 segment is developmentally diminutive or absent. 2-3 mm inferiorly projecting vascular protrusion arising from the supraclinoid left ICA, which may reflect an aneurysm or infundibulum (for instance as seen on series 1038, image 4). 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 Posterior circulation: The intracranial vertebral arteries are patent. The basilar artery is patent. The posterior cerebral arteries are patent. A right posterior communicating artery is present. The left posterior communicating artery is diminutive or absent. Anatomic variants: As described. IMPRESSION: MRI brain: 1. No evidence of acute intracranial abnormality. 2. Mild chronic small vessel ischemic changes within the cerebral white matter and pons (including a chronic pontine lacunar infarct). 3. Mild generalized parenchymal atrophy. MRA head: 1. No intracranial large vessel occlusion or proximal high-grade arterial stenosis. 2. 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 3. 2-3 mm inferiorly projecting vascular protrusion arising from the supraclinoid left ICA, which may reflect an aneurysm or infundibulum. Electronically Signed   By: Kellie Simmering D.O.   On: 01/26/2022 14:45   MR BRAIN WO CONTRAST  Result Date: 01/26/2022 CLINICAL DATA:  Episodes of staring. Dizziness. Syncope, unspecified syncope type. EXAM: MRI HEAD WITHOUT CONTRAST MRA HEAD WITHOUT CONTRAST TECHNIQUE: Multiplanar, multi-echo pulse sequences of the brain and surrounding structures were acquired without intravenous contrast. Angiographic images of the  Circle of Willis were acquired using MRA technique without intravenous contrast. COMPARISON:  Head CT 04/22/2021. FINDINGS: MRI HEAD FINDINGS Brain: Mild for age generalized parenchymal atrophy. Multifocal T2 FLAIR hyperintense signal abnormality within the cerebral white matter and pons, nonspecific but compatible with mild chronic small vessel ischemic disease. Prominent perivascular spaces in the bilateral deep gray nuclei. 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 There is no acute infarct. No evidence of an intracranial mass. No chronic intracranial blood products. No extra-axial fluid collection. No midline shift. Vascular: Maintained flow voids within the proximal large arterial vessels. Skull and upper cervical spine: No focal suspicious marrow lesion. Incompletely assessed cervical spondylosis. Sinuses/Orbits: No mass or acute finding within the imaged orbits. Prior bilateral ocular lens replacement. Minimal mucosal thickening within the bilateral ethmoid and right maxillary sinuses. MRA HEAD FINDINGS Anterior circulation: The intracranial internal carotid arteries are patent. The M1 middle cerebral arteries are patent. No M2 proximal branch occlusion or high-grade proximal stenosis. The anterior cerebral arteries are patent. The right A1 segment is developmentally diminutive or absent. 2-3 mm inferiorly projecting vascular protrusion arising from the supraclinoid left ICA, which may reflect an aneurysm or infundibulum (for instance as seen on series 1038, image 4). 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 Posterior circulation: The intracranial vertebral arteries are patent. The basilar artery is patent. The posterior  cerebral arteries are patent. A right posterior communicating artery is present. The left posterior communicating artery is diminutive or absent. Anatomic variants: As  described. IMPRESSION: MRI brain: 1. No evidence of acute intracranial abnormality. 2. Mild chronic small vessel ischemic changes within the cerebral white matter and pons (including a chronic pontine lacunar infarct). 3. Mild generalized parenchymal atrophy. MRA head: 1. No intracranial large vessel occlusion or proximal high-grade arterial stenosis. 2. 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 3. 2-3 mm inferiorly projecting vascular protrusion arising from the supraclinoid left ICA, which may reflect an aneurysm or infundibulum. Electronically Signed   By: Kellie Simmering D.O.   On: 01/26/2022 14:45   CUP PACEART REMOTE DEVICE CHECK  Result Date: 01/22/2022 Scheduled remote reviewed. Normal device function.  Next remote 91 days. Kathy Breach, RN, CCDS, [employer]  VAS US CAROTID  Result Date: 01/20/2022 Carotid Arterial Duplex Study Patient Name:  JOHNEL YIELDING [facility]  Date of Exam:   01/20/2022 Medical Rec #: [id number]        Accession #:    [id number] Date of Birth: 1940/08/31         Patient Gender: M Patient Age:   89 years Exam Location:  [facility] Procedure:      VAS US CAROTID Referring Phys: Ellouise Newer --------------------------------------------------------------------------------  Indications:       Dizziness. Risk Factors:      Hypertension. Comparison Study:  No prior study Performing Technologist: Maudry Mayhew MHA, RDMS, RVT, RDCS  Examination Guidelines: A complete evaluation includes B-mode imaging, spectral Doppler, color Doppler, and power Doppler as needed of all accessible portions of each vessel. Bilateral testing is considered an integral part of a complete examination. Limited examinations for reoccurring indications may be performed as noted.  Right Carotid Findings: +----------+--------+-------+--------+--------------------------------+--------+           PSV cm/sEDV     StenosisPlaque Description              Comments                   cm/s                                                    +----------+--------+-------+--------+--------------------------------+--------+ CCA Prox  52      14                                                      +----------+--------+-------+--------+--------------------------------+--------+ CCA Distal39      9              focal, smooth and calcific               +----------+--------+-------+--------+--------------------------------+--------+ ICA Prox  35      10             smooth, heterogenous and  calcific                                 +----------+--------+-------+--------+--------------------------------+--------+ ICA Distal55      20                                                      +----------+--------+-------+--------+--------------------------------+--------+ ECA       43      8                                                       +----------+--------+-------+--------+--------------------------------+--------+ +----------+--------+-------+----------------+-------------------+           PSV cm/sEDV cmsDescribe        Arm Pressure (mmHG) +----------+--------+-------+----------------+-------------------+ [id number]             Multiphasic, WNL                    +----------+--------+-------+----------------+-------------------+ +---------+--------+--+--------+--+---------+ VertebralPSV cm/s52EDV cm/s15Antegrade +---------+--------+--+--------+--+---------+  Left Carotid Findings: +----------+--------+-------+--------+--------------------------------+--------+           PSV cm/sEDV    StenosisPlaque Description              Comments                   cm/s                                                    +----------+--------+-------+--------+--------------------------------+--------+ CCA Prox  47       17             smooth and heterogenous                  +----------+--------+-------+--------+--------------------------------+--------+ CCA Distal42      14             smooth and heterogenous                  +----------+--------+-------+--------+--------------------------------+--------+ ICA Prox  29      15             smooth and heterogenous                  +----------+--------+-------+--------+--------------------------------+--------+ ICA Distal87      36                                                      +----------+--------+-------+--------+--------------------------------+--------+ ECA       34      5              smooth, heterogenous and  calcific                                 +----------+--------+-------+--------+--------------------------------+--------+ +----------+--------+--------+----------------+-------------------+           PSV cm/sEDV cm/sDescribe        Arm Pressure (mmHG) +----------+--------+--------+----------------+-------------------+ [id number]              Multiphasic, WNL                    +----------+--------+--------+----------------+-------------------+ +---------+--------+--+--------+--+---------+ VertebralPSV cm/s56EDV cm/s18Antegrade +---------+--------+--+--------+--+---------+   Summary: Right Carotid: Velocities in the right ICA are consistent with a 1-39% stenosis. Left Carotid: Velocities in the left ICA are consistent with a 1-39% stenosis. Vertebrals:  Bilateral vertebral arteries demonstrate antegrade flow. Subclavians: Normal flow hemodynamics were seen in bilateral subclavian              arteries. *See table(s) above for measurements and observations.  Electronically signed by Deitra Mayo MD on 01/20/2022 at 2:16:21 PM.    Final    (Echo, Carotid, EGD, Colonoscopy, ERCP)    Subjective: No complaints  Discharge Exam: Vitals:   02/04/22 0530  02/04/22 0600  BP: (!) 173/114 (!) 177/104  Pulse: 69 67  Resp: 20 16  Temp:    SpO2: 99% 98%   Vitals:   02/04/22 0014 02/04/22 0512 02/04/22 0530 02/04/22 0600  BP: (!) 175/96  (!) 173/114 (!) 177/104  Pulse: 75 76 69 67  Resp: '16 19 20 16  '$ Temp: 98 F (36.7 C) 98.6 F (37 C)    TempSrc: Oral Oral    SpO2: 97% 97% 99% 98%  Weight:  57.1 kg    Height:        General: Pt is alert, awake, not in acute distress Cardiovascular: RRR, S1/S2 +, no rubs, no gallops Respiratory: CTA bilaterally, no wheezing, no rhonchi Abdominal: Soft, NT, ND, bowel sounds + Extremities: no edema, no cyanosis    The results of significant diagnostics from this hospitalization (including imaging, microbiology, ancillary and laboratory) are listed below for reference.     Microbiology: Recent Results (from the past 240 hour(s))  WOUND CULTURE     Status: None   Collection Time: 01/28/22 12:31 PM   Specimen: Wound  Result Value Ref Range Status   Source: R UPPER BACK  Final   Status: FINAL  Final   Gram Stain:   Final    Moderate White blood cells seen Rare epithelial cells Few Gram positive cocci in clusters   RESULT:   Final    Growth of skin flora (note: Growth does not include S. aureus, beta-hemolytic Streptococci or P. aeruginosa).     Labs: BNP (last 3 results) No results for input(s): "BNP" in the last 8760 hours. Basic Metabolic Panel: Recent Labs  Lab 01/31/22 0348 02/01/22 1748 02/01/22 2157 02/02/22 0300 02/02/22 1220 02/03/22 0851  NA 140 145  --  145 142 142  K 3.9 3.5  --  2.9* 3.7 4.0  CL 110 115*  --  120* 112* 111  CO2 21* 23  --  19* 22 22  GLUCOSE 107* 131*  --  103* 130* 130*  BUN 45* 42*  --  36* 39* 51*  CREATININE 3.23* 3.18* 3.02* 2.47* 2.65* 2.71*  CALCIUM 8.7* 8.6*  --  7.1* 8.9 8.9  MG  --   --  1.5*  --  2.0 1.8   Liver Function Tests: Recent Labs  Lab 01/29/22 1659 02/01/22 1748 02/02/22 0300  AST 18 17 13*  ALT '16 12 11  '$ ALKPHOS 50 44 34*   BILITOT 0.7 0.7 0.7  PROT 6.6 6.3* 4.8*  ALBUMIN 3.8 3.6 2.6*   Recent Labs  Lab 01/29/22 1659  LIPASE 36   No results for input(s): "AMMONIA" in the last 168 hours. CBC: Recent Labs  Lab 01/29/22 1659 01/29/22 2223 01/31/22 0348 02/01/22 1748 02/01/22 2157 02/02/22 0300 02/02/22 1220  WBC 7.4   < > 8.9 9.2 9.0 7.2 9.3  NEUTROABS 5.9  --   --  7.8*  --   --   --   HGB 9.5*   < > 9.3* 9.2* 8.8* 7.2* 8.9*  HCT 27.1*   < > 27.2* 26.0* 24.8* 20.7* 26.0*  MCV 87.7   < > 88.3 87.8 87.9 89.6 88.4  PLT 185   < > 153 151 142* 121* 145*   < > = values in this interval not displayed.   Cardiac Enzymes: No results for input(s): "CKTOTAL", "CKMB", "CKMBINDEX", "TROPONINI" in the last 168 hours. BNP: Invalid input(s): "POCBNP" CBG: Recent Labs  Lab 01/31/22 1112 02/01/22 2146  GLUCAP 102* 101*   D-Dimer Recent Labs    02/01/22 2157  DDIMER 1.47*   Hgb A1c No results for input(s): "HGBA1C" in the last 72 hours. Lipid Profile No results for input(s): "CHOL", "HDL", "LDLCALC", "TRIG", "CHOLHDL", "LDLDIRECT" in the last 72 hours. Thyroid function studies No results for input(s): "TSH", "T4TOTAL", "T3FREE", "THYROIDAB" in the last 72 hours.  Invalid input(s): "FREET3" Anemia work up Recent Labs    02/02/22 0300 02/02/22 1118  VITAMINB12  --  251  FOLATE  --  16.4  FERRITIN  --  122  TIBC  --  214*  IRON  --  65  RETICCTPCT 2.1  --    Urinalysis    Component Value Date/Time   COLORURINE YELLOW 02/01/2022 1912   APPEARANCEUR HAZY (A) 02/01/2022 1912   LABSPEC 1.011 02/01/2022 1912   PHURINE 5.0 02/01/2022 1912   GLUCOSEU NEGATIVE 02/01/2022 1912   HGBUR NEGATIVE 02/01/2022 1912   BILIRUBINUR NEGATIVE 02/01/2022 1912   KETONESUR NEGATIVE 02/01/2022 1912   PROTEINUR 100 (A) 02/01/2022 1912   UROBILINOGEN 0.2 08/23/2012 2225   NITRITE NEGATIVE 02/01/2022 1912   LEUKOCYTESUR NEGATIVE 02/01/2022 1912   Sepsis Labs Recent Labs  Lab 02/01/22 1748  02/01/22 2157 02/02/22 0300 02/02/22 1220  WBC 9.2 9.0 7.2 9.3   Microbiology Recent Results (from the past 240 hour(s))  WOUND CULTURE     Status: None   Collection Time: 01/28/22 12:31 PM   Specimen: Wound  Result Value Ref Range Status   Source: R UPPER BACK  Final   Status: FINAL  Final   Gram Stain:   Final    Moderate White blood cells seen Rare epithelial cells Few Gram positive cocci in clusters   RESULT:   Final    Growth of skin flora (note: Growth does not include S. aureus, beta-hemolytic Streptococci or P. aeruginosa).     Time coordinating discharge: Over 30 minutes  SIGNED:   Charlynne Cousins, MD  [facility] 02/04/2022, 9:09 AM Pager   If 7PM-7AM, please contact night-coverage [email or web address] Password [email or web address]

## 2022-02-04 NOTE — Care Management Important Message (Signed)
Important Message  Patient Details  Name: Alan [name] MRN: [id number] Date of Birth: 1940/11/08   Medicare Important Message Given:  Yes     [name] 02/04/2022, 10:33 AM

## 2022-02-04 NOTE — TOC Progression Note (Addendum)
Transition of Care [facility]) - Progression Note    Patient Details  Name: Antoinne Spadaccini [place] MRN: [id number] Date of Birth: 10/24/1940  Transition of Care [facility]) CM/SW [place], [place] Phone Number: 02/04/2022, 10:20 AM  Clinical Narrative:     Update 2:24pm- CSW spoke with patients insurance to check status on patients insurance authorization. Patients insurance requested current PT/OT notes. CSW informed PT and MD. CSW awaiting current PT/OT notes to fax to patients insurance for review.    Patients insurance authorization currently pending. CSW will continue to follow. CSW informed MD. Patient has SNF bed at [place].  Expected Discharge Plan: [place] Barriers to Discharge: Continued Medical Work up  Expected Discharge Plan and Services Expected Discharge Plan: [place] In-house Referral: Clinical Social Work     Living arrangements for the past 2 months: Single Family Home Expected Discharge Date: 02/03/22                                     Social Determinants of Health (SDOH) Interventions    Readmission Risk Interventions     No data to display

## 2022-02-04 NOTE — Progress Notes (Addendum)
Physical Therapy Treatment Patient Details Name: Alan [name] MRN: [id number] DOB: 09-Nov-1940 Today's Date: 02/04/2022   History of Present Illness Pt is an 81 y/o male admitted secondary to syncope likely from orthostatic hypotension. Recent admission for the same. PMH includes CKD, a fib, carcinoid of intestine, pacemaker, and PE.    PT Comments    Pt limited by orthostatic hypotension with syncopal episode during session. Pt standing for orthostatic vitals with reports of symptoms. Pt sits and after~15 seconds becomes less responsive, PT notes pt to be drooling with eyes rolled back into head. PT assists pt into supine and then trendelenburg position with improved arousal, BP improving to 98/58, and later 148/91 by end of session. Due to consistent orthostatic hypotension and syncope, pt will benefit from attempted use of compression stockings and abdominal binder next session to see if these will aide in mitigating potential syncopal episodes. Pt participates well in LE HEP and demonstrates good motivation to progress activity and mobility. PT continues to recommend SNF placement as the pt is unable to safely mobilize within the home at this time.   BP in supine : 141/77 BP in sitting: 111/69 BP in standing: 74/53 BP following syncopal episode with return to supine and trendelenburg position: 98/58 BP end of session in chair position in bed: 148/91  Recommendations for follow up therapy are one component of a multi-disciplinary discharge planning process, led by the attending physician.  Recommendations may be updated based on patient status, additional functional criteria and insurance authorization.  Follow Up Recommendations  Skilled nursing-short term rehab (<3 hours/day) Can patient physically be transported by private vehicle: Yes   Assistance Recommended at Discharge Frequent or constant Supervision/Assistance  Patient can return home with the following A little help with walking  and/or transfers;Assistance with cooking/housework;Assist for transportation;Help with stairs or ramp for entrance;A little help with bathing/dressing/bathroom   Equipment Recommendations  None recommended by PT    Recommendations for Other Services       Precautions / Restrictions Precautions Precautions: Fall;Other (comment) Precaution Comments: syncope Restrictions Weight Bearing Restrictions: No     Mobility  Bed Mobility Overal bed mobility: Needs Assistance Bed Mobility: Supine to Sit, Sit to Supine     Supine to sit: Supervision Sit to supine: Total assist, +2 for physical assistance   General bed mobility comments: totalA x2 to return to supine due to syncopal event at edge of bed    Transfers Overall transfer level: Needs assistance Equipment used: Rollator (4 wheels) Transfers: Sit to/from Stand Sit to Stand: Supervision                Ambulation/Gait Ambulation/Gait assistance:  (deferred 2/2 symptomatic hypotension)                 Stairs             Wheelchair Mobility    Modified Rankin (Stroke Patients Only)       Balance Overall balance assessment: Needs assistance Sitting-balance support: No upper extremity supported, Feet supported Sitting balance-Leahy Scale: Fair     Standing balance support: Single extremity supported, Bilateral upper extremity supported, Reliant on assistive device for balance Standing balance-Leahy Scale: Poor                              Cognition Arousal/Alertness: Awake/alert Behavior During Therapy: WFL for tasks assessed/performed Overall Cognitive Status: Within Functional Limits for tasks assessed  General Comments: Decreased safety awareness, but likely pt's baseline        Exercises General Exercises - Lower Extremity Ankle Circles/Pumps: AROM, Both, 10 reps Long Arc Quad: AROM, Both, 10 reps Heel Slides: AROM, Both, 10  reps Hip ABduction/ADduction: AROM, Both, 10 reps, Supine Straight Leg Raises: AROM, Both, 15 reps Hip Flexion/Marching: AROM, Both, 10 reps, Seated    General Comments General comments (skin integrity, edema, etc.): orthostatic vitals documented in vitals flowsheet and in note      Pertinent Vitals/Pain Pain Assessment Pain Assessment: No/denies pain    Home Living                          Prior Function            PT Goals (current goals can now be found in the care plan section) Acute Rehab PT Goals Patient Stated Goal: to get stronger before going home per wife Progress towards PT goals: Not progressing toward goals - comment (limited by orthostatic hypotension)    Frequency    Min 2X/week      PT Plan Current plan remains appropriate    Co-evaluation              AM-PAC PT "6 Clicks" Mobility   Outcome Measure  Help needed turning from your back to your side while in a flat bed without using bedrails?: A Little Help needed moving from lying on your back to sitting on the side of a flat bed without using bedrails?: A Little Help needed moving to and from a bed to a chair (including a wheelchair)?: A Little Help needed standing up from a chair using your arms (e.g., wheelchair or bedside chair)?: A Little Help needed to walk in hospital room?: Total Help needed climbing 3-5 steps with a railing? : Total 6 Click Score: 14    End of Session   Activity Tolerance: Treatment limited secondary to medical complications (Comment) (syncope) Patient left: in bed;with call bell/phone within reach;with bed alarm set Nurse Communication: Mobility status PT Visit Diagnosis: History of falling (Z91.81);Repeated falls (R29.6);Muscle weakness (generalized) (M62.81)     Time: 2536-6440 PT Time Calculation (min) (ACUTE ONLY): 31 min  Charges:  $Therapeutic Exercise: 8-22 mins $Therapeutic Activity: 8-22 mins                     Zenaida Niece, PT,  DPT Acute Rehabilitation Office [place] Sibbie Flammia 02/04/2022, 4:34 PM

## 2022-02-05 DIAGNOSIS — R55 Syncope and collapse: Secondary | ICD-10-CM | POA: Diagnosis not present

## 2022-02-05 DIAGNOSIS — N184 Chronic kidney disease, stage 4 (severe): Secondary | ICD-10-CM | POA: Diagnosis not present

## 2022-02-05 DIAGNOSIS — D649 Anemia, unspecified: Secondary | ICD-10-CM | POA: Diagnosis not present

## 2022-02-05 DIAGNOSIS — E43 Unspecified severe protein-calorie malnutrition: Secondary | ICD-10-CM | POA: Insufficient documentation

## 2022-02-05 DIAGNOSIS — I951 Orthostatic hypotension: Secondary | ICD-10-CM | POA: Diagnosis not present

## 2022-02-05 LAB — GLUCOSE, CAPILLARY
Glucose-Capillary: 164 mg/dL — ABNORMAL HIGH (ref 70–99)
Glucose-Capillary: 152 mg/dL — ABNORMAL HIGH (ref 70–99)

## 2022-02-05 MED ORDER — BOOST PLUS PO LIQD
237.0000 mL | Freq: Three times a day (TID) | ORAL | Status: DC
  Administered 2022-02-05 – 2022-02-09 (×6): 237 mL via ORAL
  Filled 2022-02-05 (×16): qty 237

## 2022-02-05 NOTE — Progress Notes (Signed)
Had an episode of orthostatic hypotension with loss of consciousness he was off telemetry, but he was unresponsive he had a pulse came back to to his senses in 30 seconds, his map remained greater than 65.  When I evaluated him he had no focal deficit was able to talk in complete sentences and carry on a conversation he was arguing with me that he still wanted to go to rehab today.  I put him back on telemetry and explained the risk and benefits of doing that I would like to monitor him for 24 hours have informed the social worker, we will check labs and if he remained stable over the next 24 hours he can be discharged to skilled nursing facility.

## 2022-02-05 NOTE — Progress Notes (Signed)
Initial Nutrition Assessment  DOCUMENTATION CODES:   Severe malnutrition in context of chronic illness, Underweight  INTERVENTION:   Boost Plus PO TID, each supplement provides 360 kcal and 14 gm protein. Magic cup TID with meals, each supplement provides 290 kcal and 9 grams of protein. Pudding with all meals per patient request. MVI with minerals daily.  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  GOAL:   Patient will meet greater than or equal to 90% of their needs  MONITOR:   PO intake, Supplement acceptance  REASON FOR ASSESSMENT:   Malnutrition Screening Tool    ASSESSMENT:   81 yo male admitted with syncope, orthostatic hypotension. 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  Patient reports 100 lb weight loss in the past year. He has a very poor appetite d/t cancer. He likes Boost chocolate supplements. Also agreed to receive pudding and magic cups with meals to maximize oral intake.  Weight history reviewed. 10% weight loss within the past 3 months is severe.  Patient meets criteria for severe malnutrition, given severe depletion of muscle and subcutaneous fat mass with 10% weight loss within 3 months.  Labs and medications reviewed.   NUTRITION - FOCUSED PHYSICAL EXAM:  Flowsheet Row Most Recent Value  Orbital Region Severe depletion  Upper Arm Region Severe depletion  Thoracic and Lumbar Region Severe depletion  Buccal Region Severe depletion  Temple Region Severe depletion  Clavicle Bone Region Severe depletion  Clavicle and Acromion Bone Region Severe depletion  Scapular Bone Region Severe depletion  Dorsal Hand Severe depletion  Patellar Region Severe depletion  Anterior Thigh Region Severe depletion  Posterior Calf Region Severe  depletion  Edema (RD Assessment) None  Hair Reviewed  Eyes Reviewed  Mouth Reviewed  Skin Reviewed  Nails Reviewed       Diet Order:   Diet Order             Diet regular Room service appropriate? Yes; Fluid consistency: Thin  Diet effective now                   EDUCATION NEEDS:   Education needs have been addressed  Skin:  Skin Assessment: Skin Integrity Issues: (full thickness wound R shoulder)  Last BM:  8/9  Height:   Ht Readings from Last 1 Encounters:  02/02/22 '6\' 1"'$  (1.854 m)    Weight:   Wt Readings from Last 1 Encounters:  02/05/22 58.8 kg    BMI:  Body mass index is 17.1 kg/m.  Estimated Nutritional Needs:   Kcal:  2000-2200  Protein:  90-110 gm  Fluid:  >/= 2 L   Lucas Mallow RD, LDN, CNSC Please refer to Amion for contact information.

## 2022-02-05 NOTE — Progress Notes (Addendum)
Patient had another syncopal episode that began while family was at bedside. Writer entered room and found patient with eyes open and unresponsive. Additional staff called to room to assist as needed. Placed patient in Trendelenburg per Dr. Lavetta Nielsen instruction. Charge nurse provided sternal rubs. Patient soon became responsive and began asking for his wife and son. Dr. Olevia Bowens was paged. New order was written. Writer will continue to monitor patient.

## 2022-02-05 NOTE — Progress Notes (Signed)
   02/05/22 1230  Clinical Encounter Type  Visited With Patient not available;Health care provider  Visit Type Code;Initial  Referral From Nurse  Consult/Referral To Chaplain Melvenia Beam)  Recommendations CODE BLUE   This chaplain responded to page for [place]. Upon arrival patient was alert and oriented talking with staff. No Family present at this time. [address] [place], [place] [phone]

## 2022-02-05 NOTE — TOC Progression Note (Addendum)
Transition of Care [facility]) - Progression Note    Patient Details  Name: Alan [name] MRN: [id number] Date of Birth: 09/30/1940  Transition of Care [facility]) CM/SW [place], [place] Phone Number: 02/05/2022, 8:47 AM  Clinical Narrative:     Update-11:12am- Patients insurance authorization was approved. Plan auth ID# [id number] [place] ID# [id number]. Insurance authorization approved from 8/10-8/14.  CSW informed MD and facility. Shiela with [employer] confirmed patient can dc over today. CSW informed patients spouse. Patients spouse request [facility] for transport.  CSW submitted current PT note to patients insurance for review. CSW awaiting determination. Patient has SN[facility] bed at [facility] and rehab. CSW will continue to follow.   Expected Discharge Plan: [place] Barriers to Discharge: Continued Medical Work up  Expected Discharge Plan and Services Expected Discharge Plan: [place] In-house Referral: Clinical Social Work     Living arrangements for the past 2 months: Single Family Home Expected Discharge Date: 02/03/22                                     Social Determinants of Health (SDOH) Interventions    Readmission Risk Interventions     No data to display

## 2022-02-05 NOTE — Progress Notes (Signed)
Patient contacted both his wife and his sister to share the recent change in his health status. Patient had expressed to writer he was fine with his sister being aware of his health status. Writer spoke with sister about patient's health status. Patient's spouse later contacted writer stating that she was upset that no one contacted her directly from the clinical staff and that no one had permission from her to be advised of the patient's health status. Additionally, she stated patient did not have the cognitive ability to make his own decisions. Patient was assessed as alert and oriented x4 by Probation officer. Additionally, there is no record of a mental health advance directive or a court appointed legal guardian in patient's chart.

## 2022-02-05 NOTE — TOC Transition Note (Addendum)
Transition of Care [facility]) - CM/SW Discharge Note   Patient Details  Name: Isaid Salvia Priego MRN: [id number] Date of Birth: [birth year]  Transition of Care [facility]) CM/SW Contact:  Milas Gain, [place] Phone Number: 02/05/2022, 11:32 AM   Clinical Narrative:     Update-12:59pm- MD informed CSW patient will not dc today.CSW informed facility.Facility confirmed they can accept tomorrow if medically ready for dc. CSW informed MD.CSW cancelled PTAR. CSW informed patients spouse Corey Harold was cancelled.  Patient will DC to: [facility] date: 02/05/2022  Family notified: [name]  Transport by: Corey Harold  ?  Per MD patient ready for DC to [facility] . RN, patient, patient's family, and facility notified of DC. Discharge Summary sent to facility. RN given number for report tele# [phone] RM# [phone] . DC packet on chart. Ambulance transport requested for patient.  CSW signing off.   Final next level of care: Skilled Nursing Facility Barriers to Discharge: No Barriers Identified   Patient Goals and CMS Choice Patient states their goals for this hospitalization and ongoing recovery are:: SNF CMS Medicare.gov Compare Post Acute Care list provided to:: Patient Choice offered to / list presented to : Patient, Spouse  Discharge Placement              Patient chooses bed at: [facility] Patient to be transferred to facility by: [place] Name of family member notified: [name] Patient and family notified of of transfer: 02/05/22  Discharge Plan and Services In-house Referral: Clinical Social Work                                   Social Determinants of Health (SDOH) Interventions     Readmission Risk Interventions     No data to display

## 2022-02-05 NOTE — Consult Note (Signed)
   [facility] CM Inpatient Consult   02/05/2022  [place] 03-16-1941 [id number]  [place] [facility] [ACO] Patient: Alan [name]  Primary Care Provider:  [name], MD, [place] [facility] an Embedded provider  Rounded on patient 02/04/22 awaiting Therapy  eval   Patient screened today for readmission less than 7 days noted hospitalization and to assess for potential [facility] service needs for post hospital transition.  Review of patient's medical record reveals patient is being recommended from PT/OT last evening for a skilled nursing facility level of care.   Plan:  If patient transitions to a [facility] skilled nursing facility level of care will ask for [name] RN to follow for transitioning back to community as current plan is for post ST rehab SNF needs.  For questions contact:   Natividad Brood, RN BSN [facility] Liaison  [phone] business mobile phone Toll free office [phone]  Fax number: [phone] [email or web address]

## 2022-02-05 NOTE — Plan of Care (Signed)

## 2022-02-05 NOTE — Discharge Summary (Addendum)
Physician Discharge Summary  Alan [name] SAY:[id number] DOB: 1941-01-03 DOA: 02/01/2022  PCP: [name], MD  Admit date: 02/01/2022 Discharge date: 02/05/2022  Admitted From: Home Disposition:  SNF [facility]  Recommendations for Outpatient Follow-up:  Follow up with PCP in 1-2 weeks Please obtain BMP/CBC in one week daily dressing changes (pack w/ iodoform gauze, cover w/ dry gauze and foam)  Home Health:No Equipment/Devices:None  Discharge Condition:Stable CODE STATUS:Full Diet recommendation: Heart Healthy  Brief/Interim Summary: 81 y.o. male with medical history significant of  intestinal carcinoid, chronic diarrhea, CKD stage 3, anemia, paroxysmal atrial fibrillation, pulmonary embolism, tachy brady syndrome sp pacemaker, orthostatic hypotension, who was recently discharged today after  hospital stay 8/3-8/6  for recurrent syncope above his baseline due to progressive orthostatic hypotension. Patient was treated with ivfs with improvement and per wife only had one episode of syncope while in house which is close to his baseline. He was discharged on midodrine and florinef.  Patient states day of discharge he did not feel well. He describes feeling as feeling heavy. He states he did not mention this because he wanted to go home. However at home he continue to feel unwell and had two syncopal episodes with bp 60 systolic in the field per notes.  Patient currently states he has been complaint with his medications, he notes he did have episode of loose stools and note he did not eat well. He notes no chest pain , sob,  n/v/. He does endorse weight loss.   Discharge Diagnoses:  Active Problems:   Orthostatic hypotension   Paroxysmal atrial fibrillation (HCC)   Syncope   CKD (chronic kidney disease), stage IV (HCC)   Acute on chronic anemia   Protein-calorie malnutrition, severe  Syncope due to orthostatic hypotension: With an ongoing recurring mention patient was recently  discharged on 02/01/2022 come back to the hospital half a day later with similar symptoms during this time he was discharged on Florinef and midodrine and clearly instructed to take his medication morning was his blood pressure systolic was greater than 190. Patient was frustrated and demanding a cure he was instructed by the previous physician that there is a combination of his carcinoid tumor, tachybradycardia syndrome and atrial fibrillation. He is currently on lanreotide every 4 weeksx 6. Realistic expectation were discussed with the patient.  History of atrial fibrillation: Rate control not on anticoagulation due to history of GI bleed.  Anemia of chronic disease: Hemoglobin has remained close to baseline.  Hypomagnesemia/hypokalemia: Repleted now resolved.  History of PE: Lower extremity Doppler was negative for DVT VQ scan showed intermediate to high probability PE, CT angio of the chest cannot be done due to his chronic kidney disease stage IV. The previous physician spoke with the patient that he clinically does not appear to have a PE especially in the lower extremity DVT has been ruled out he had a very lengthy discussion with the patient and the wife about the risk and benefits of being on anticoagulation versus not being in the light of history of recent GI bleed.  The patient the wife and the previous physician made a mutual decision not to start him on anticoagulation and watch closely for symptoms.  He was made aware that if he develops significant tachycardia chest pain or shortness of breath he needs to seek immediate medical attention.  Chronic kidney disease stage IV: Left renal mass follow-up with urology as an outpatient his creatinine remained at baseline.  Malignant carcinoid tumor of small  intestine: With chronic diarrhea follow-up with oncology as an outpatient continue chemotherapy.  Discharge Instructions  Discharge Instructions     Diet - low sodium heart healthy    Complete by: As directed    Increase activity slowly   Complete by: As directed    No wound care   Complete by: As directed       Allergies as of 02/05/2022       Reactions   Penicillins Hives, Shortness Of Breath   Lisinopril Itching        Medication List     TAKE these medications    albuterol 108 (90 Base) MCG/ACT inhaler Commonly known as: VENTOLIN HFA Inhale 2 puffs into the lungs every 6 (six) hours as needed for wheezing or shortness of breath.   aspirin EC 81 MG tablet Take 81 mg by mouth daily. Swallow whole.   diphenoxylate-atropine 2.5-0.025 MG tablet Commonly known as: LOMOTIL Take 1 tablet by mouth 2 (two) times daily as needed for diarrhea or loose stools.   feeding supplement (ENSURE COMPLETE) Liqd Take 237 mLs by mouth 2 (two) times daily between meals.   fludrocortisone 0.1 MG tablet Commonly known as: FLORINEF Take 1 tablet (0.1 mg total) by mouth daily.   Imodium A-D 2 MG capsule Generic drug: loperamide Take 2 mg by mouth daily as needed for diarrhea or loose stools.   iron polysaccharides 150 MG capsule Commonly known as: NIFEREX TAKE 1 CAPSULE(150 MG) BY MOUTH 3 TIMES A WEEK What changed: See the new instructions.   LANREOTIDE ACETATE [place] Inject 1 Dose into the skin every 30 (thirty) days.   midodrine 5 MG tablet Commonly known as: PROAMATINE Take 3 tablets (15 mg total) by mouth 3 (three) times daily with meals.   mirtazapine 30 MG tablet Commonly known as: REMERON Take 1 tablet (30 mg total) by mouth daily.   multivitamin Tabs tablet Take 1 tablet by mouth daily.   Potassium Chloride ER 20 MEQ Tbcr Take 1 tablet by mouth 2 (two) times daily.   PRESERVISION AREDS 2 PO Take 1 capsule by mouth daily.   Symbicort 160-4.5 MCG/ACT inhaler Generic drug: budesonide-formoterol Inhale 2 puffs into the lungs daily as needed (wheezing).   thiamine 50 MG tablet Commonly known as: VITAMIN B-1 Take 1 tablet (50 mg total) by mouth  daily.        Follow-up Information     [name], MD Follow up in 1 week(s).   Specialty: Internal Medicine Contact information: [place] [address] [phone]         Sueanne Margarita, MD .   Specialty: Cardiology Contact information: [id number] [place] [address] [phone]         Constance Haw, MD .   Specialty: Cardiology Contact information: [address] [place] [address] [phone]                Allergies  Allergen Reactions   Penicillins Hives and Shortness Of Breath   Lisinopril Itching    Consultations: Nephrology   Procedures/Studies: VAS [place] LOWER EXTREMITY VENOUS (DVT)  Result Date: 02/02/2022  Lower Venous DVT Study Patient Name:  GRADY MOHABIR [facility]  Date of Exam:   02/02/2022 Medical Rec #: [id number]        Accession #:    [id number] Date of Birth: 03-09-1941         Patient Gender: M Patient Age:   81  years [facility] Procedure:      VAS [place] LOWER EXTREMITY VENOUS (DVT) Referring Phys: RAVI PAHWANI --------------------------------------------------------------------------------  Indications: Edema.  Comparison Study: prior 01/19/21 Performing Technologist: Archie Patten RVS  Examination Guidelines: A complete evaluation includes B-mode imaging, spectral Doppler, color Doppler, and power Doppler as needed of all accessible portions of each vessel. Bilateral testing is considered an integral part of a complete examination. Limited examinations for reoccurring indications may be performed as noted. The reflux portion of the exam is performed with the patient in reverse Trendelenburg.  +---------+---------------+---------+-----------+----------+--------------+ RIGHT    CompressibilityPhasicitySpontaneityPropertiesThrombus Aging +---------+---------------+---------+-----------+----------+--------------+ CFV      Full           Yes      Yes                                  +---------+---------------+---------+-----------+----------+--------------+ SFJ      Full                                                        +---------+---------------+---------+-----------+----------+--------------+ FV Prox  Full                                                        +---------+---------------+---------+-----------+----------+--------------+ FV Mid   Full                                                        +---------+---------------+---------+-----------+----------+--------------+ FV DistalFull                                                        +---------+---------------+---------+-----------+----------+--------------+ PFV      Full                                                        +---------+---------------+---------+-----------+----------+--------------+ POP      Full           Yes      Yes                                 +---------+---------------+---------+-----------+----------+--------------+ PTV      Full                                                        +---------+---------------+---------+-----------+----------+--------------+ PERO     Full                                                        +---------+---------------+---------+-----------+----------+--------------+   +---------+---------------+---------+-----------+----------+--------------+  LEFT     CompressibilityPhasicitySpontaneityPropertiesThrombus Aging +---------+---------------+---------+-----------+----------+--------------+ CFV      Full           Yes      Yes                                 +---------+---------------+---------+-----------+----------+--------------+ SFJ      Full                                                        +---------+---------------+---------+-----------+----------+--------------+ FV Prox  Full                                                         +---------+---------------+---------+-----------+----------+--------------+ FV Mid   Full                                                        +---------+---------------+---------+-----------+----------+--------------+ FV DistalFull                                                        +---------+---------------+---------+-----------+----------+--------------+ PFV      Full                                                        +---------+---------------+---------+-----------+----------+--------------+ POP      Full           Yes      Yes                                 +---------+---------------+---------+-----------+----------+--------------+ PTV      Full                                                        +---------+---------------+---------+-----------+----------+--------------+ PERO     Full                                                        +---------+---------------+---------+-----------+----------+--------------+     Summary: BILATERAL: - No evidence of deep vein thrombosis seen in the lower extremities, bilaterally. -No evidence of popliteal cyst, bilaterally.   *See table(s) above for measurements and observations. Electronically signed by Servando Snare MD on 02/02/2022 at 5:13:58 PM.  Final    NM Pulmonary Perfusion  Result Date: 02/02/2022 CLINICAL DATA:  Syncope, shortness of breath EXAM: NUCLEAR MEDICINE PERFUSION LUNG SCAN TECHNIQUE: Perfusion images were obtained in multiple projections after intravenous injection of radiopharmaceutical. Ventilation scans intentionally deferred if perfusion scan and chest x-ray adequate for interpretation during COVID 19 epidemic. RADIOPHARMACEUTICALS:  Four mCi Tc-47mMAA IV COMPARISON:  Previous studies including the chest radiographs done today and CT chest done on 01/16/2021 FINDINGS: There are multiple subsegmental and segmental foci of decreased perfusion in the mid and lower lung fields on both sides, more  so on the left side. There is no infiltrate in the lung fields in the chest radiograph. Evaluation is somewhat limited without ventilation images. IMPRESSION: There are multiple segmental and subsegmental foci of decreased perfusion in both lungs, more so in the left lower lung field. Evaluation is limited without ventilation images. No focal abnormalities are seen in the lung fields in the chest radiograph. Findings suggest intermediate to high probability for pulmonary embolism. If clinically warranted, please consider venous Doppler examination of both lower extremities or CT pulmonary angiogram. Electronically Signed   By: PElmer PickerM.D.   On: 02/02/2022 15:54   DG CHEST PORT 1 VIEW  Result Date: 02/02/2022 CLINICAL DATA:  Short of breath EXAM: PORTABLE CHEST 1 VIEW COMPARISON:  01/29/2022 FINDINGS: No new consolidation or edema. No pleural effusion or pneumothorax. Stable cardiomediastinal contours with normal heart size. Left chest wall dual lead pacemaker. IMPRESSION: No acute process in the chest. Electronically Signed   By: PMacy MisM.D.   On: 02/02/2022 13:00   DG Chest 2 View  Result Date: 01/29/2022 CLINICAL DATA:  Multiple syncopal episodes over the past several weeks. EXAM: CHEST - 2 VIEW COMPARISON:  Chest x-ray dated October 19, 2021. FINDINGS: Unchanged left chest wall pacemaker. The heart size and mediastinal contours are within normal limits. Normal pulmonary vascularity. No focal consolidation, pleural effusion, or pneumothorax. Multiple skin folds again seen overlying the chest. No acute osseous abnormality. IMPRESSION: No active cardiopulmonary disease. Electronically Signed   By: WTitus DubinM.D.   On: 01/29/2022 17:32   EEG adult  Result Date: 01/28/2022 ACameron Sprang MD     01/28/2022 11:08 AM ELECTROENCEPHALOGRAM REPORT Date of Study: 01/28/2022 Patient's Name: LAdekunle RohrbachMcDougle MRN: [id number] of Birth: [birth year]Referring Provider: Dr. KEllouise NewerClinical  History: This is an 81year old man with recurrent dizziness, staring episodes, and syncope. EEG for classification. Medications: Aspirin, Questran, Lomotil, Marinol, Florinef, Proamantine, Remeron, Thiamine Technical Summary: A multichannel digital EEG recording measured by the international 10-20 system with electrodes applied with paste and impedances below 5000 ohms performed in our laboratory with EKG monitoring in an awake and asleep patient.  Hyperventilation was not performed. Photic stimulation was performed.  The digital EEG was referentially recorded, reformatted, and digitally filtered in a variety of bipolar and referential montages for optimal display.  Description: The patient is awake and asleep during the recording.  During maximal wakefulness, there is a symmetric, medium voltage 8 Hz posterior dominant rhythm that attenuates with eye opening.  The record is symmetric.  During drowsiness and sleep, there is an increase in theta slowing of the background with vertex waves seen. Photic stimulation did not elicit any abnormalities.  There were no epileptiform discharges or electrographic seizures seen.  EKG lead was unremarkable. Impression: This awake and asleep EEG is normal.  Clinical Correlation: A normal EEG does not exclude a clinical diagnosis of  epilepsy.  If further clinical questions remain, prolonged EEG may be helpful.  Clinical correlation is advised. Ellouise Newer, M.D.   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 EXAM: MRI ABDOMEN WITHOUT CONTRAST TECHNIQUE: Multiplanar multisequence MR imaging was performed without the administration of intravenous contrast. COMPARISON:  11/18/2021 FINDINGS: Lower chest: No acute findings. Hepatobiliary: Intrinsically T2 hyperintense mass of the central liver dome, hepatic segment VII/VIII measuring 4.2 x 4.1 cm, not significantly changed compared to prior  PET-CT accounting for cross modality comparison when measured with similar technique (series 5, image 4). Similar lesion adjacent to the falciform ligament, hepatic segment III/IV B, measuring 1.4 x 1.3 cm, previously noted on PET examination but not clearly visualized by noncontrast CT (series 5, image 15). Benign, lobulated fluid signal cyst of the inferior right lobe of the liver adjacent to the gallbladder fossa (series 5, image 15). No gallstones. No biliary ductal dilatation. Pancreas: No mass, inflammatory changes, or other parenchymal abnormality identified.No pancreatic ductal dilatation. Spleen:  Within normal limits in size and appearance. Adrenals/Urinary Tract: Normal adrenal glands. Intrinsically T2 hypointense, T1 heterogeneous exophytic solid lesion of the posterior midportion of the left kidney measuring 2.6 x 2.2 cm (series 5, image 22). Multiseptated cystic lesion of the posterior midportion of the right kidney measuring 1.9 x 1.2 cm (series 5, image 12, series 4, image 6). Multiple additional simple appearing, fluid signal benign renal cortical cysts bilaterally. Nonobstructive calculus of the inferior pole of the right kidney. No evidence of hydronephrosis. Stomach/Bowel: Visualized portions within the abdomen are unremarkable. Vascular/Lymphatic: No pathologically enlarged lymph nodes identified. No abdominal aortic aneurysm demonstrated. Other:  Trace perihepatic ascites. Musculoskeletal: No suspicious osseous lesions identified. IMPRESSION: 1. Solid lesion of the posterior midportion of the left kidney measuring 2.6 x 2.2 cm, in keeping with ultrasound findings and suspicious for a renal cell carcinoma. Please note that noncontrast MR is significantly limited for the characterization of abdominal organ lesions, and generally should be evaluated. Stable chronic renal disease is generally not a contraindication to gadolinium contrast administration. 2. Multiseptated cystic lesion of the  posterior midportion of the right kidney measuring 1.9 x 1.2 cm, consistent with a complex cyst although incompletely characterized for contrast enhancement, as above. 3. Multiple additional simple appearing benign appearing renal cortical cysts bilaterally, for which no further follow-up or characterization is required. 4. Masses of the liver dome and left lobe of the liver, not significantly changed compared to prior PET-CT, consistent with known metastatic carcinoid. 5. 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 6. Trace perihepatic ascites. Electronically Signed   By: Delanna Ahmadi M.D.   On: 01/26/2022 15:30   MR ANGIO HEAD WO CONTRAST  Result Date: 01/26/2022 CLINICAL DATA:  Episodes of staring. Dizziness. Syncope, unspecified syncope type. EXAM: MRI HEAD WITHOUT CONTRAST MRA HEAD WITHOUT CONTRAST TECHNIQUE: Multiplanar, multi-echo pulse sequences of the brain and surrounding structures were acquired without intravenous contrast. Angiographic images of the Circle of Willis were acquired using MRA technique without intravenous contrast. COMPARISON:  Head CT 04/22/2021. FINDINGS: MRI HEAD FINDINGS Brain: Mild for age generalized parenchymal atrophy. Multifocal T2 FLAIR hyperintense signal abnormality within the cerebral white matter and pons, nonspecific but compatible with mild chronic small vessel ischemic disease. Prominent perivascular spaces in the bilateral deep gray nuclei. 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 There is no acute infarct.  No evidence of an intracranial mass. No chronic intracranial blood products. No extra-axial fluid collection. No midline shift. Vascular: Maintained flow voids within the proximal large arterial vessels. Skull and upper cervical spine: No focal suspicious marrow lesion. Incompletely assessed cervical spondylosis. Sinuses/Orbits: No mass or acute finding within the  imaged orbits. Prior bilateral ocular lens replacement. Minimal mucosal thickening within the bilateral ethmoid and right maxillary sinuses. MRA HEAD FINDINGS Anterior circulation: The intracranial internal carotid arteries are patent. The M1 middle cerebral arteries are patent. No M2 proximal branch occlusion or high-grade proximal stenosis. The anterior cerebral arteries are patent. The right A1 segment is developmentally diminutive or absent. 2-3 mm inferiorly projecting vascular protrusion arising from the supraclinoid left ICA, which may reflect an aneurysm or infundibulum (for instance as seen on series 1038, image 4). 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 Posterior circulation: The intracranial vertebral arteries are patent. The basilar artery is patent. The posterior cerebral arteries are patent. A right posterior communicating artery is present. The left posterior communicating artery is diminutive or absent. Anatomic variants: As described. IMPRESSION: MRI brain: 1. No evidence of acute intracranial abnormality. 2. Mild chronic small vessel ischemic changes within the cerebral white matter and pons (including a chronic pontine lacunar infarct). 3. Mild generalized parenchymal atrophy. MRA head: 1. No intracranial large vessel occlusion or proximal high-grade arterial stenosis. 2. 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 3. 2-3 mm inferiorly projecting vascular protrusion arising from the supraclinoid left ICA, which may reflect an aneurysm or infundibulum. Electronically Signed   By: Kellie Simmering D.O.   On: 01/26/2022 14:45   MR BRAIN WO CONTRAST  Result Date: 01/26/2022 CLINICAL DATA:  Episodes of staring. Dizziness. Syncope, unspecified syncope type. EXAM: MRI HEAD WITHOUT CONTRAST MRA HEAD WITHOUT CONTRAST TECHNIQUE:  Multiplanar, multi-echo pulse sequences of the brain and surrounding structures were acquired without intravenous contrast. Angiographic images of the Circle of Willis were acquired using MRA technique without intravenous contrast. COMPARISON:  Head CT 04/22/2021. FINDINGS: MRI HEAD FINDINGS Brain: Mild for age generalized parenchymal atrophy. Multifocal T2 FLAIR hyperintense signal abnormality within the cerebral white matter and pons, nonspecific but compatible with mild chronic small vessel ischemic disease. Prominent perivascular spaces in the bilateral deep gray nuclei. 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 There is no acute infarct. No evidence of an intracranial mass. No chronic intracranial blood products. No extra-axial fluid collection. No midline shift. Vascular: Maintained flow voids within the proximal large arterial vessels. Skull and upper cervical spine: No focal suspicious marrow lesion. Incompletely assessed cervical spondylosis. Sinuses/Orbits: No mass or acute finding within the imaged orbits. Prior bilateral ocular lens replacement. Minimal mucosal thickening within the bilateral ethmoid and right maxillary sinuses. MRA HEAD FINDINGS Anterior circulation: The intracranial internal carotid arteries are patent. The M1 middle cerebral arteries are patent. No M2 proximal branch occlusion or high-grade proximal stenosis. The anterior cerebral arteries are patent. The right A1 segment is developmentally diminutive or absent. 2-3 mm inferiorly projecting vascular protrusion arising from the supraclinoid left ICA, which may reflect an aneurysm or infundibulum (for instance as seen on series 1038, image 4). 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 Posterior circulation: The intracranial vertebral arteries are patent. The basilar artery is patent. The posterior  cerebral arteries are  patent. A right posterior communicating artery is present. The left posterior communicating artery is diminutive or absent. Anatomic variants: As described. IMPRESSION: MRI brain: 1. No evidence of acute intracranial abnormality. 2. Mild chronic small vessel ischemic changes within the cerebral white matter and pons (including a chronic pontine lacunar infarct). 3. Mild generalized parenchymal atrophy. MRA head: 1. No intracranial large vessel occlusion or proximal high-grade arterial stenosis. 2. 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 3. 2-3 mm inferiorly projecting vascular protrusion arising from the supraclinoid left ICA, which may reflect an aneurysm or infundibulum. Electronically Signed   By: Kellie Simmering D.O.   On: 01/26/2022 14:45   CUP PACEART REMOTE DEVICE CHECK  Result Date: 01/22/2022 Scheduled remote reviewed. Normal device function.  Next remote 91 days. Kathy Breach, RN, CCDS, [employer]  VAS US CAROTID  Result Date: 01/20/2022 Carotid Arterial Duplex Study Patient Name:  ASHVIN ADELSON [facility]  Date of Exam:   01/20/2022 Medical Rec #: [id number]        Accession #:    [id number] Date of Birth: [birth year]         Patient Gender: M Patient Age:   66 years Exam Location:  [facility] Procedure:      VAS US CAROTID Referring Phys: Ellouise Newer --------------------------------------------------------------------------------  Indications:       Dizziness. Risk Factors:      Hypertension. Comparison Study:  No prior study Performing Technologist: Maudry Mayhew MHA, RDMS, RVT, RDCS  Examination Guidelines: A complete evaluation includes B-mode imaging, spectral Doppler, color Doppler, and power Doppler as needed of all accessible portions of each vessel. Bilateral testing is considered an integral part of a complete examination. Limited examinations for reoccurring indications may be performed as noted.   Right Carotid Findings: +----------+--------+-------+--------+--------------------------------+--------+           PSV cm/sEDV    StenosisPlaque Description              Comments                   cm/s                                                    +----------+--------+-------+--------+--------------------------------+--------+ CCA Prox  52      14                                                      +----------+--------+-------+--------+--------------------------------+--------+ CCA Distal39      9              focal, smooth and calcific               +----------+--------+-------+--------+--------------------------------+--------+ ICA Prox  35      10             smooth, heterogenous and  calcific                                 +----------+--------+-------+--------+--------------------------------+--------+ ICA Distal55      20                                                      +----------+--------+-------+--------+--------------------------------+--------+ ECA       43      8                                                       +----------+--------+-------+--------+--------------------------------+--------+ +----------+--------+-------+----------------+-------------------+           PSV cm/sEDV cmsDescribe        Arm Pressure (mmHG) +----------+--------+-------+----------------+-------------------+ [id number]             Multiphasic, WNL                    +----------+--------+-------+----------------+-------------------+ +---------+--------+--+--------+--+---------+ VertebralPSV cm/s52EDV cm/s15Antegrade +---------+--------+--+--------+--+---------+  Left Carotid Findings: +----------+--------+-------+--------+--------------------------------+--------+           PSV cm/sEDV    StenosisPlaque Description              Comments                   cm/s                                                     +----------+--------+-------+--------+--------------------------------+--------+ CCA Prox  47      17             smooth and heterogenous                  +----------+--------+-------+--------+--------------------------------+--------+ CCA Distal42      14             smooth and heterogenous                  +----------+--------+-------+--------+--------------------------------+--------+ ICA Prox  29      15             smooth and heterogenous                  +----------+--------+-------+--------+--------------------------------+--------+ ICA Distal87      36                                                      +----------+--------+-------+--------+--------------------------------+--------+ ECA       34      5              smooth, heterogenous and  calcific                                 +----------+--------+-------+--------+--------------------------------+--------+ +----------+--------+--------+----------------+-------------------+           PSV cm/sEDV cm/sDescribe        Arm Pressure (mmHG) +----------+--------+--------+----------------+-------------------+ [id number]              Multiphasic, WNL                    +----------+--------+--------+----------------+-------------------+ +---------+--------+--+--------+--+---------+ VertebralPSV cm/s56EDV cm/s18Antegrade +---------+--------+--+--------+--+---------+   Summary: Right Carotid: Velocities in the right ICA are consistent with a 1-39% stenosis. Left Carotid: Velocities in the left ICA are consistent with a 1-39% stenosis. Vertebrals:  Bilateral vertebral arteries demonstrate antegrade flow. Subclavians: Normal flow hemodynamics were seen in bilateral subclavian              arteries. *See table(s) above for measurements and observations.  Electronically signed by Deitra Mayo MD on 01/20/2022 at 2:16:21 PM.     Final    (Echo, Carotid, EGD, Colonoscopy, ERCP)    Subjective: No complaints  Discharge Exam: Vitals:   02/05/22 0427 02/05/22 0853  BP: (!) 159/91   Pulse: 65   Resp: 19   Temp: 97.9 F (36.6 C)   SpO2: 100% 100%   Vitals:   02/04/22 1929 02/04/22 1931 02/05/22 0427 02/05/22 0853  BP: (!) 178/97  (!) 159/91   Pulse: 63  65   Resp: 15  19   Temp: 98.1 F (36.7 C)  97.9 F (36.6 C)   TempSrc: Oral  Oral   SpO2: 100% 100% 100% 100%  Weight:   58.8 kg   Height:        General: Pt is alert, awake, not in acute distress Cardiovascular: RRR, S1/S2 +, no rubs, no gallops Respiratory: CTA bilaterally, no wheezing, no rhonchi Abdominal: Soft, NT, ND, bowel sounds + Extremities: no edema, no cyanosis    The results of significant diagnostics from this hospitalization (including imaging, microbiology, ancillary and laboratory) are listed below for reference.     Microbiology: Recent Results (from the past 240 hour(s))  WOUND CULTURE     Status: None   Collection Time: 01/28/22 12:31 PM   Specimen: Wound  Result Value Ref Range Status   Source: R UPPER BACK  Final   Status: FINAL  Final   Gram Stain:   Final    Moderate White blood cells seen Rare epithelial cells Few Gram positive cocci in clusters   RESULT:   Final    Growth of skin flora (note: Growth does not include S. aureus, beta-hemolytic Streptococci or P. aeruginosa).     Labs: BNP (last 3 results) No results for input(s): "BNP" in the last 8760 hours. Basic Metabolic Panel: Recent Labs  Lab 01/31/22 0348 02/01/22 1748 02/01/22 2157 02/02/22 0300 02/02/22 1220 02/03/22 0851  NA 140 145  --  145 142 142  K 3.9 3.5  --  2.9* 3.7 4.0  CL 110 115*  --  120* 112* 111  CO2 21* 23  --  19* 22 22  GLUCOSE 107* 131*  --  103* 130* 130*  BUN 45* 42*  --  36* 39* 51*  CREATININE 3.23* 3.18* 3.02* 2.47* 2.65* 2.71*  CALCIUM 8.7* 8.6*  --  7.1* 8.9 8.9  MG  --   --  1.5*  --  2.0 1.8   Liver Function  Tests:  Recent Labs  Lab 01/29/22 1659 02/01/22 1748 02/02/22 0300  AST 18 17 13*  ALT '16 12 11  '$ ALKPHOS 50 44 34*  BILITOT 0.7 0.7 0.7  PROT 6.6 6.3* 4.8*  ALBUMIN 3.8 3.6 2.6*   Recent Labs  Lab 01/29/22 1659  LIPASE 36   No results for input(s): "AMMONIA" in the last 168 hours. CBC: Recent Labs  Lab 01/29/22 1659 01/29/22 2223 01/31/22 0348 02/01/22 1748 02/01/22 2157 02/02/22 0300 02/02/22 1220  WBC 7.4   < > 8.9 9.2 9.0 7.2 9.3  NEUTROABS 5.9  --   --  7.8*  --   --   --   HGB 9.5*   < > 9.3* 9.2* 8.8* 7.2* 8.9*  HCT 27.1*   < > 27.2* 26.0* 24.8* 20.7* 26.0*  MCV 87.7   < > 88.3 87.8 87.9 89.6 88.4  PLT 185   < > 153 151 142* 121* 145*   < > = values in this interval not displayed.   Cardiac Enzymes: No results for input(s): "CKTOTAL", "CKMB", "CKMBINDEX", "TROPONINI" in the last 168 hours. BNP: Invalid input(s): "POCBNP" CBG: Recent Labs  Lab 01/31/22 1112 02/01/22 2146  GLUCAP 102* 101*   D-Dimer No results for input(s): "DDIMER" in the last 72 hours.  Hgb A1c No results for input(s): "HGBA1C" in the last 72 hours. Lipid Profile No results for input(s): "CHOL", "HDL", "LDLCALC", "TRIG", "CHOLHDL", "LDLDIRECT" in the last 72 hours. Thyroid function studies No results for input(s): "TSH", "T4TOTAL", "T3FREE", "THYROIDAB" in the last 72 hours.  Invalid input(s): "FREET3" Anemia work up No results for input(s): "VITAMINB12", "FOLATE", "FERRITIN", "TIBC", "IRON", "RETICCTPCT" in the last 72 hours.  Urinalysis    Component Value Date/Time   COLORURINE YELLOW 02/01/2022 1912   APPEARANCEUR HAZY (A) 02/01/2022 1912   LABSPEC 1.011 02/01/2022 1912   PHURINE 5.0 02/01/2022 1912   GLUCOSEU NEGATIVE 02/01/2022 1912   HGBUR NEGATIVE 02/01/2022 1912   BILIRUBINUR NEGATIVE 02/01/2022 1912   KETONESUR NEGATIVE 02/01/2022 1912   PROTEINUR 100 (A) 02/01/2022 1912   UROBILINOGEN 0.2 08/23/2012 2225   NITRITE NEGATIVE 02/01/2022 1912   LEUKOCYTESUR  NEGATIVE 02/01/2022 1912   Sepsis Labs Recent Labs  Lab 02/01/22 1748 02/01/22 2157 02/02/22 0300 02/02/22 1220  WBC 9.2 9.0 7.2 9.3   Microbiology Recent Results (from the past 240 hour(s))  WOUND CULTURE     Status: None   Collection Time: 01/28/22 12:31 PM   Specimen: Wound  Result Value Ref Range Status   Source: R UPPER BACK  Final   Status: FINAL  Final   Gram Stain:   Final    Moderate White blood cells seen Rare epithelial cells Few Gram positive cocci in clusters   RESULT:   Final    Growth of skin flora (note: Growth does not include S. aureus, beta-hemolytic Streptococci or P. aeruginosa).     Time coordinating discharge: Over 30 minutes  SIGNED:   Charlynne Cousins, MD  [facility] 02/05/2022, 11:27 AM Pager   If 7PM-7AM, please contact night-coverage [email or web address] Password [email or web address]

## 2022-02-06 DIAGNOSIS — I951 Orthostatic hypotension: Secondary | ICD-10-CM | POA: Diagnosis not present

## 2022-02-06 DIAGNOSIS — I480 Paroxysmal atrial fibrillation: Secondary | ICD-10-CM | POA: Diagnosis not present

## 2022-02-06 DIAGNOSIS — E43 Unspecified severe protein-calorie malnutrition: Secondary | ICD-10-CM

## 2022-02-06 DIAGNOSIS — D649 Anemia, unspecified: Secondary | ICD-10-CM | POA: Diagnosis not present

## 2022-02-06 DIAGNOSIS — R55 Syncope and collapse: Secondary | ICD-10-CM | POA: Diagnosis not present

## 2022-02-06 DIAGNOSIS — N184 Chronic kidney disease, stage 4 (severe): Secondary | ICD-10-CM | POA: Diagnosis not present

## 2022-02-06 MED ORDER — DROXIDOPA 100 MG PO CAPS
100.0000 mg | ORAL_CAPSULE | Freq: Three times a day (TID) | ORAL | Status: DC
  Administered 2022-02-06 – 2022-02-09 (×8): 100 mg via ORAL
  Filled 2022-02-06 (×8): qty 1

## 2022-02-06 NOTE — TOC Progression Note (Signed)
Transition of Care [facility]) - Progression Note    Patient Details  Name: Alan [name] MRN: [id number] Date of Birth: March 02, 1941  Transition of [facility]) CM/SW [place], [place] Phone Number: 02/06/2022, 10:22 AM  Clinical Narrative:     Patient has SNF bed at [facility] when medically ready for dc. Insurance authorization is good from 8/10-8/14 auth ID# [id number] [place] ID# [id number]. CSW will continue to follow and assist with patients dc planning needs.  Expected Discharge Plan: [place] Barriers to Discharge: No Barriers Identified  Expected Discharge Plan and Services Expected Discharge Plan: [place] In-house Referral: Clinical Social Work     Living arrangements for the past 2 months: Single Family Home Expected Discharge Date: 02/05/22                                     Social Determinants of Health (SDOH) Interventions    Readmission Risk Interventions    02/05/2022   12:11 PM  Readmission Risk Prevention Plan  Transportation Screening Complete  PCP or Specialist Appt within 5-7 Days Complete  Home Care Screening Complete  Medication Review (RN CM) Complete

## 2022-02-06 NOTE — Progress Notes (Signed)
Physical Therapy Treatment Patient Details Name: Alan [name] MRN: [id number] DOB: Feb 27, 1941 Today's Date: 02/06/2022   History of Present Illness Pt is an 81 y/o male admitted secondary to syncope likely from orthostatic hypotension. Recent admission for the same. PMH includes CKD, a fib, carcinoid of intestine, pacemaker, and PE.    PT Comments    Pt remains limited by orthostatic hypotension and episodes of reduced responsiveness after attempts at standing/pre-gait activities. Pt experiences an episode of reduced responsiveness during session of ~10 seconds, HR stable on monitor, + orthostatic hypotension. This occurs despite use of TED hose and abdominal binder. Pt participates well in therapeutic exercise and is motivated to mobilize with therapy, however safety remains a concern due to consistent orthostatic hypotension and multiple syncopal episodes. Pt will benefit from continued PT services in an effort to slowly progress sitting and standing tolerance. PT continues to recommend SNF placement at this time.  Orthostatic Vitals: Supine pre-mobility: 125/84 Sitting: 104/54 Standing s/p Marching: 97/67 Supine after episode of reduced responsiveness: 74/43 Supine after ~5-10 minutes rest: 171/87   HR remains in 70s throughout session despite changes in position and BP   Recommendations for follow up therapy are one component of a multi-disciplinary discharge planning process, led by the attending physician.  Recommendations may be updated based on patient status, additional functional criteria and insurance authorization.  Follow Up Recommendations  Skilled nursing-short term rehab (<3 hours/day) Can patient physically be transported by private vehicle: No   Assistance Recommended at Discharge Frequent or constant Supervision/Assistance  Patient can return home with the following A little help with walking and/or transfers;Assistance with cooking/housework;Assist for  transportation;Help with stairs or ramp for entrance;A little help with bathing/dressing/bathroom   Equipment Recommendations  Wheelchair (measurements PT)    Recommendations for Other Services       Precautions / Restrictions Precautions Precautions: Fall;Other (comment) Precaution Comments: syncope Restrictions Weight Bearing Restrictions: No     Mobility  Bed Mobility Overal bed mobility: Needs Assistance Bed Mobility: Supine to Sit, Sit to Supine     Supine to sit: Supervision Sit to supine: Total assist   General bed mobility comments: totalA to return to supine due to episode of reduced arousal/responsiveness    Transfers Overall transfer level: Needs assistance Equipment used: Rolling walker (2 wheels) Transfers: Sit to/from Stand Sit to Stand: Min guard                Ambulation/Gait Ambulation/Gait assistance: Min guard   Assistive device: Rolling walker (2 wheels)       Pre-gait activities: pt marches in place for 30 seconds at edge of bed     Stairs             Wheelchair Mobility    Modified Rankin (Stroke Patients Only)       Balance Overall balance assessment: Needs assistance Sitting-balance support: No upper extremity supported, Feet supported Sitting balance-Leahy Scale: Fair     Standing balance support: Single extremity supported, Bilateral upper extremity supported, Reliant on assistive device for balance Standing balance-Leahy Scale: Poor                              Cognition Arousal/Alertness: Awake/alert Behavior During Therapy: WFL for tasks assessed/performed Overall Cognitive Status: Impaired/Different from baseline Area of Impairment: Safety/judgement, Awareness  Safety/Judgement: Decreased awareness of safety, Decreased awareness of deficits Awareness: Emergent   General Comments: pt asks if he is able to get up and walk around at the end of session after  experiencing another episode when standing, showing reduced insight into his deficits and safety.        Exercises General Exercises - Lower Extremity Ankle Circles/Pumps: AROM, Both, 15 reps Long Arc Quad: AROM, Both, 15 reps Hip ABduction/ADduction: AROM, Both, 15 reps, Seated Hip Flexion/Marching: AROM, Both, 15 reps, Seated    General Comments General comments (skin integrity, edema, etc.): orthostatic vitals documented in flowsheet. Pt with episode of reduced responsiveness during session. After participating in marching at edge of bed the pt reports feeling unwell, PT instructs pt to return to supine. Pt sits and demonstrates slowed progression of mobility, only responding verbally to cues to return to supine and not initiating mobility of trunk or extremities. Pt then becomes unresponsive, PT provides totalA to return to supine and utilize trendelenburg function of bed. Pt becomes responsive to verbal communication within ~10 seconds of being in trendelenburg position. BP found to be 74/43 (taken during transition into supine). After ~5 minutes in supine pt BP back to 171/87.      Pertinent Vitals/Pain Pain Assessment Pain Assessment: No/denies pain    Home Living                          Prior Function            PT Goals (current goals can now be found in the care plan section) Acute Rehab PT Goals Patient Stated Goal: to get stronger before going home per wife Progress towards PT goals: Not progressing toward goals - comment (limited by orthostatic hypotension and episodes of reduced responsiveness)    Frequency    Min 2X/week      PT Plan Current plan remains appropriate    Co-evaluation              AM-PAC PT "6 Clicks" Mobility   Outcome Measure  Help needed turning from your back to your side while in a flat bed without using bedrails?: A Little Help needed moving from lying on your back to sitting on the side of a flat bed without using  bedrails?: A Little Help needed moving to and from a bed to a chair (including a wheelchair)?: A Little Help needed standing up from a chair using your arms (e.g., wheelchair or bedside chair)?: A Little Help needed to walk in hospital room?: Total Help needed climbing 3-5 steps with a railing? : Total 6 Click Score: 14    End of Session   Activity Tolerance: Treatment limited secondary to medical complications (Comment) (syncope? reduced responsiveness) Patient left: in bed;with call bell/phone within reach;with bed alarm set Nurse Communication: Mobility status PT Visit Diagnosis: History of falling (Z91.81);Repeated falls (R29.6);Muscle weakness (generalized) (M62.81)     Time: 9741-6384 PT Time Calculation (min) (ACUTE ONLY): 62 min  Charges:  $Therapeutic Exercise: 8-22 mins $Therapeutic Activity: 38-52 mins                     Zenaida Niece, PT, DPT Acute Rehabilitation Office [place] [facility] Ercole 02/06/2022, 4:18 PM

## 2022-02-06 NOTE — Progress Notes (Signed)
[facility] PROGRESS NOTE    Progress Note  Alan [name]  ZOX:[id number] DOB: 1941-04-13 DOA: 02/01/2022 PCP: [name], MD     Brief Narrative:   Alan [name] is an 81 y.o. male with medical history significant of  intestinal carcinoid, chronic diarrhea, CKD stage 3, anemia, paroxysmal atrial fibrillation, pulmonary embolism, tachy brady syndrome sp pacemaker, orthostatic hypotension, who was recently discharged today after  hospital stay 8/3-8/6  for recurrent syncope above his baseline due to progressive orthostatic hypotension. Patient was treated with ivfs with improvement and per wife only had one episode of syncope while in house which is close to his baseline. He was discharged on midodrine and florinef.  Patient states day of discharge he did not feel well. He describes feeling as feeling heavy. He states he did not mention this because he wanted to go home. However at home he continue to feel unwell and had two syncopal episodes with bp 60 systolic in the field per notes.  Patient currently states he has been complaint with his medications, he notes he did have episode of loose stools and note he did not eat well. He notes no chest pain , sob,  n/v/. He does endorse weight loss.    Assessment/Plan:   Orthostatic hypotension: Recurring ongoing and severe patient was discharged on 02/01/2022 and came back with the same symptoms.  Now happening while in the head. He was continue Florinef and midodrine. He continues to have episodes of passing out with no events on telemetry. Will go ahead and consult cardiology, as his episodes are getting severe he is now having them while in the bed. I explained to the patient is a combination of his carcinoid tumor tachybradycardia syndrome and atrial fibrillation.  Paroxysmal atrial fibrillation [facility]): Rate control not a candidate for anticoagulation due to GI bleed.  Anemia of chronic disease: Hemoglobin has remained  stable.  Hypomagnesemia/hypokalemia: Now resolved.  History of PE: Lower extremity Doppler was negative for DVT VQ scan showed intermediate to high probability PE, CT angio of the chest cannot be done due to his chronic kidney disease stage IV. The previous physician spoke with the patient that he clinically does not appear to have a PE especially in the lower extremity DVT has been ruled out he had a very lengthy discussion with the patient and the wife about the risk and benefits of being on anticoagulation versus not being in the light of history of recent GI bleed.  The patient the wife and the previous physician made a mutual decision not to start him on anticoagulation and watch closely for symptoms.  Chronic kidney disease stage IV: Left renal mass follow-up with urology as an outpatient his creatinine remained at baseline.   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   DVT prophylaxis: none Family Communication:wife Status is: Inpatient Remains inpatient appropriate because: Persistent severe orthostatic hypotension    Code Status:     Code Status Orders  (From admission, onward)           Start     Ordered   02/01/22 2105  Full code  Continuous        02/01/22 2106           Code Status History     Date Active Date Inactive Code Status Order ID Comments User Context   01/29/2022 2207 02/01/2022 1507 Full Code [id number]  Rise Patience, MD ED  10/20/2021 0137 10/24/2021 1957 Full Code [id number]  Vianne Bulls, MD ED   04/22/2021 2110 04/24/2021 1620 Full Code [id number]  Vernelle Emerald, MD ED   01/18/2021 2007 01/22/2021 2333 Full Code [id number]  Etta Quill, DO ED   01/17/2021 0009 01/17/2021 2034 Full Code [id number]  Jonnie Finner, DO Inpatient      Advance Directive Documentation    Flowsheet Row Most Recent Value  Type of Advance Directive Healthcare  Power of Attorney  Pre-existing out of facility DNR order (yellow form or pink MOST form) --  "MOST" Form in Place? --         IV Access:   Peripheral IV   Procedures and diagnostic studies:   No results found.   Medical Consultants:   None.   Subjective:    Alan [name] No complains, yesterday episodes worse when he was laying in bed.  Objective:    Vitals:   02/05/22 1909 02/05/22 2309 02/06/22 0422 02/06/22 0826  BP: 105/70 (!) 157/78 (!) 163/83   Pulse: 78 60 70   Resp: '20 16 14   '$ Temp: 97.6 F (36.4 C) 97.6 F (36.4 C) 97.9 F (36.6 C)   TempSrc: Oral Oral Oral   SpO2: 100% 100% 100% 99%  Weight:   59.5 kg   Height:       SpO2: 99 % O2 Flow Rate (L/min): 2 L/min   Intake/Output Summary (Last 24 hours) at 02/06/2022 0957 Last data filed at 02/06/2022 0427 Gross per 24 hour  Intake --  Output 530 ml  Net -530 ml   Filed Weights   02/04/22 0512 02/05/22 0427 02/06/22 0422  Weight: 57.1 kg 58.8 kg 59.5 kg    Exam: General exam: In no acute distress. Respiratory system: Good air movement and clear to auscultation. Cardiovascular system: S1 & S2 heard, RRR. No JVD. Gastrointestinal system: Abdomen is nondistended, soft and nontender.  Extremities: No pedal edema. Skin: No rashes, lesions or ulcers Psychiatry: Judgement and insight appear normal. Mood & affect appropriate.    Data Reviewed:    Labs: Basic Metabolic Panel: Recent Labs  Lab 01/31/22 0348 02/01/22 1748 02/01/22 2157 02/02/22 0300 02/02/22 1220 02/03/22 0851  NA 140 145  --  145 142 142  K 3.9 3.5  --  2.9* 3.7 4.0  CL 110 115*  --  120* 112* 111  CO2 21* 23  --  19* 22 22  GLUCOSE 107* 131*  --  103* 130* 130*  BUN 45* 42*  --  36* 39* 51*  CREATININE 3.23* 3.18* 3.02* 2.47* 2.65* 2.71*  CALCIUM 8.7* 8.6*  --  7.1* 8.9 8.9  MG  --   --  1.5*  --  2.0 1.8   GFR Estimated Creatinine Clearance: 18.3 mL/min (A) (by C-G formula based on SCr of 2.71 mg/dL  (H)). Liver Function Tests: Recent Labs  Lab 02/01/22 1748 02/02/22 0300  AST 17 13*  ALT 12 11  ALKPHOS 44 34*  BILITOT 0.7 0.7  PROT 6.3* 4.8*  ALBUMIN 3.6 2.6*   No results for input(s): "LIPASE", "AMYLASE" in the last 168 hours. No results for input(s): "AMMONIA" in the last 168 hours. Coagulation profile No results for input(s): "INR", "PROTIME" in the last 168 hours. COVID-19 Labs  No results for input(s): "DDIMER", "FERRITIN", "LDH", "CRP" in the last 72 hours.  Lab Results  Component Value Date   SARSCOV2NAA NEGATIVE 04/22/2021   Nitro NEGATIVE 01/16/2021    CBC: Recent  Labs  Lab 01/31/22 0348 02/01/22 1748 02/01/22 2157 02/02/22 0300 02/02/22 1220  WBC 8.9 9.2 9.0 7.2 9.3  NEUTROABS  --  7.8*  --   --   --   HGB 9.3* 9.2* 8.8* 7.2* 8.9*  HCT 27.2* 26.0* 24.8* 20.7* 26.0*  MCV 88.3 87.8 87.9 89.6 88.4  PLT 153 151 142* 121* 145*   Cardiac Enzymes: No results for input(s): "CKTOTAL", "CKMB", "CKMBINDEX", "TROPONINI" in the last 168 hours. BNP (last 3 results) No results for input(s): "PROBNP" in the last 8760 hours. CBG: Recent Labs  Lab 01/31/22 1112 02/01/22 2146 02/05/22 1233 02/05/22 1618  GLUCAP 102* 101* 164* 152*   D-Dimer: No results for input(s): "DDIMER" in the last 72 hours. Hgb A1c: No results for input(s): "HGBA1C" in the last 72 hours. Lipid Profile: No results for input(s): "CHOL", "HDL", "LDLCALC", "TRIG", "CHOLHDL", "LDLDIRECT" in the last 72 hours. Thyroid function studies: No results for input(s): "TSH", "T4TOTAL", "T3FREE", "THYROIDAB" in the last 72 hours.  Invalid input(s): "FREET3" Anemia work up: No results for input(s): "VITAMINB12", "FOLATE", "FERRITIN", "TIBC", "IRON", "RETICCTPCT" in the last 72 hours. Sepsis Labs: Recent Labs  Lab 02/01/22 1748 02/01/22 2157 02/02/22 0300 02/02/22 1220  WBC 9.2 9.0 7.2 9.3   Microbiology Recent Results (from the past 240 hour(s))  WOUND CULTURE     Status: None    Collection Time: 01/28/22 12:31 PM   Specimen: Wound  Result Value Ref Range Status   Source: R UPPER BACK  Final   Status: FINAL  Final   Gram Stain:   Final    Moderate White blood cells seen Rare epithelial cells Few Gram positive cocci in clusters   RESULT:   Final    Growth of skin flora (note: Growth does not include S. aureus, beta-hemolytic Streptococci or P. aeruginosa).     Medications:    aspirin EC  81 mg Oral Daily   fludrocortisone  0.1 mg Oral Daily   heparin  5,000 Units Subcutaneous Q8H   lactose free nutrition  237 mL Oral TID BM   midodrine  15 mg Oral TID WC   mirtazapine  30 mg Oral QHS   mometasone-formoterol  2 puff Inhalation BID   multivitamin with minerals  1 tablet Oral Daily   potassium chloride SA  20 mEq Oral BID   thiamine  50 mg Oral Daily   Continuous Infusions:    LOS: 5 days   Charlynne Cousins  [facility]  02/06/2022, 9:57 AM

## 2022-02-06 NOTE — Consult Note (Signed)
Cardiology Consultation:   Patient ID: Alan [name] MRN: [id number]; DOB: 11/17/1940  Admit date: 02/01/2022 Date of Consult: 02/06/2022  PCP:  [name], MD   [facility] Providers Cardiologist:  Fransico Him, MD  Electrophysiologist:  Constance Haw, MD  {      Patient Profile:   Alan [name] is a 81 y.o. male with a hx of carcinoid of the small intestines, short gut syndrome, hypertension, chronic stage III kidney disease, chronic diarrhea, asthma, pacemaker, severe orthostatic hypotension who is being seen 02/06/2022 for the evaluation of syncope at the request of Charlynne Cousins, MD.  History of Present Illness:   Echocardiogram July 2022 showed normal LV function, mild left ventricular hypertrophy.  Patient was to be discharged August 10 following admission for syncope which was felt to be orthostatic mediated.  VQ scan during that admission intermediate to high probability but CTA could not be performed due to chronic kidney disease.  Decision was made that he had not had a pulmonary embolus and lower extremity venous Dopplers were negative.  Before discharge patient had an episode of hypotension/loss of consciousness for 30 seconds.  He had a second episode yesterday afternoon.  Cardiology now asked to evaluate.  Patient continues to have episodes of frequent syncope.  His previous episodes occurred predominately when he changed positions from the sitting to the standing position.  He states he is now having symptoms even when sitting or lying.  No events noted on telemetry.  He states he will feel "queasy", dizzy and short of breath followed by unconsciousness.  Symptoms last seconds to minutes.  He otherwise denies dyspnea or exertional chest pain.  Note he also has chronic diarrhea from his carcinoid.   Past Medical History:  Diagnosis Date   Asthma    Carcinoid tumor, small intestine, malignant (HCC)    Chronic diarrhea    CKD (chronic kidney disease)  stage 3, GFR 30-59 ml/min (HCC)    HTN (hypertension)    Short gut syndrome     Past Surgical History:  Procedure Laterality Date   COLON SURGERY     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   Procedure: PACEMAKER IMPLANT;  Surgeon: Constance Haw, MD;  Location: [place] [facility];  Service: Cardiovascular;  Laterality: N/A;     Inpatient Medications: Scheduled Meds:  aspirin EC  81 mg Oral Daily   fludrocortisone  0.1 mg Oral Daily   heparin  5,000 Units Subcutaneous Q8H   lactose free nutrition  237 mL Oral TID BM   midodrine  15 mg Oral TID WC   mirtazapine  30 mg Oral QHS   mometasone-formoterol  2 puff Inhalation BID   multivitamin with minerals  1 tablet Oral Daily   potassium chloride SA  20 mEq Oral BID   thiamine  50 mg Oral Daily   Continuous Infusions:  PRN Meds: acetaminophen **OR** acetaminophen, albuterol, diphenoxylate-atropine, ondansetron **OR** ondansetron (ZOFRAN) IV  Allergies:    Allergies  Allergen Reactions   Penicillins Hives and Shortness Of Breath   Lisinopril Itching    Social History:   Social History   Socioeconomic History   Marital status: Married    Spouse name: Not on file   Number of children: Not on file   Years of education: Not on file   Highest education level: Not on file  Occupational History   Not on file  Tobacco Use   Smoking status: Some Days    Packs/day: 0.30  Years: 61.00    Total pack years: 18.30    Types: Cigars, Cigarettes    Start date: 82    Last attempt to quit: 03/03/2020    Years since quitting: 1.9   Smokeless tobacco: Never   Tobacco comments:    Former Risk manager Use: Never used  Substance and Sexual Activity   Alcohol use: Not Currently   Drug use: No   Sexual activity: Not Currently  Other Topics Concern   Not on file  Social History Narrative   Right handed    Lives with wife    Social Determinants of Health   Financial Resource Strain: Low Risk  (07/02/2021)    Overall Financial Resource Strain (CARDIA)    Difficulty of Paying Living Expenses: Not very hard  Food Insecurity: No Food Insecurity (01/07/2022)   Hunger Vital Sign    Worried About Running Out of Food in the Last Year: Never true    Ran Out of Food in the Last Year: Never true  Transportation Needs: No Transportation Needs (01/07/2022)   PRAPARE - Hydrologist (Medical): No    Lack of Transportation (Non-Medical): No  Physical Activity: Not on file  Stress: Stress Concern Present (07/15/2021)   [place]    Feeling of Stress : Very much  Social Connections: Not on file  Intimate Partner Violence: Not on file    Family History:    Family History  Problem Relation Age of Onset   Alzheimer's disease Mother      ROS:  Please see the history of present illness.  Chronic diarrhea. All other ROS reviewed and negative.     Physical Exam/Data:   Vitals:   02/06/22 0422 02/06/22 0826 02/06/22 0840 02/06/22 1443  BP: (!) 163/83  (!) 193/100 (!) 104/54  Pulse: 70  74 74  Resp: '14  19 20  '$ Temp: 97.9 F (36.6 C)   98 F (36.7 C)  TempSrc: Oral   Oral  SpO2: 100% 99% 100% 98%  Weight: 59.5 kg     Height:        Intake/Output Summary (Last 24 hours) at 02/06/2022 1721 Last data filed at 02/06/2022 0427 Gross per 24 hour  Intake --  Output 530 ml  Net -530 ml      02/06/2022    4:22 AM 02/05/2022    4:27 AM 02/04/2022    5:12 AM  Last 3 Weights  Weight (lbs) 131 lb 3.2 oz 129 lb 10.1 oz 125 lb 14.1 oz  Weight (kg) 59.512 kg 58.8 kg 57.1 kg     Body mass index is 17.31 kg/m.  General:  Well nourished, well developed, in no acute distress HEENT: normal Neck: no JVD Vascular: No carotid bruits; Distal pulses 2+ bilaterally Cardiac:  normal S1, S2; RRR; no murmur  Lungs:  clear to auscultation bilaterally, no wheezing, rhonchi or rales  Abd: soft, nontender, no hepatomegaly  Ext:  no edema Musculoskeletal:  No deformities, BUE and BLE strength normal and equal Skin: warm and dry  Neuro:  CNs 2-12 intact, no focal abnormalities noted Psych:  Normal affect   EKG:  The EKG was personally reviewed and demonstrates: Sinus rhythm with nonspecific ST changes. Telemetry:  Telemetry was personally reviewed and demonstrates: Normal sinus rhythm with intermittent atrial pacing.   Laboratory Data:  High Sensitivity Troponin:   Recent Labs  Lab 01/29/22 1843 02/01/22  1748 02/01/22 1908 02/01/22 2157 02/01/22 2256  TROPONINIHS 30* 28* 24* 32* 28*     Chemistry Recent Labs  Lab 02/01/22 2157 02/02/22 0300 02/02/22 1220 02/03/22 0851  NA  --  145 142 142  K  --  2.9* 3.7 4.0  CL  --  120* 112* 111  CO2  --  19* 22 22  GLUCOSE  --  103* 130* 130*  BUN  --  36* 39* 51*  CREATININE 3.02* 2.47* 2.65* 2.71*  CALCIUM  --  7.1* 8.9 8.9  MG 1.5*  --  2.0 1.8  GFRNONAA 20* 26* 24* 23*  ANIONGAP  --  '6 8 9    '$ Recent Labs  Lab 02/01/22 1748 02/02/22 0300  PROT 6.3* 4.8*  ALBUMIN 3.6 2.6*  AST 17 13*  ALT 12 11  ALKPHOS 44 34*  BILITOT 0.7 0.7    Hematology Recent Labs  Lab 02/01/22 2157 02/02/22 0300 02/02/22 1220  WBC 9.0 7.2 9.3  RBC 2.82* 2.31*  2.33* 2.94*  HGB 8.8* 7.2* 8.9*  HCT 24.8* 20.7* 26.0*  MCV 87.9 89.6 88.4  MCH 31.2 31.2 30.3  MCHC 35.5 34.8 34.2  RDW 13.9 13.9 13.8  PLT 142* 121* 145*    DDimer  Recent Labs  Lab 02/01/22 2157  DDIMER 1.47*        Assessment and Plan:   Severe orthostatic hypotension-patient states his symptoms have now progressed to occasionally occurring while sitting or lying down.  He uses compression hose and occasionally abdominal binder.  He tries to increase fluid and sodium intake.  He is on a combination of midodrine 15 mg 3 times daily and Florinef 0.1 mg daily.  Despite this he continues to have symptoms.  I will discontinue midodrine and try droxidopa 100 mg p.o. 3 times daily.  Increase as  tolerated and as needed.  Difficult situation. History of paroxysmal atrial fibrillation-patient is in sinus rhythm.  He would not be a candidate for anticoagulation given severe orthostatic hypotension and recurrent syncope with falls. History of tachybradycardia syndrome status post dual pacemaker-followed by electrophysiology.  For questions or updates, please contact [place] Please consult [email or web address] for contact info under    Signed, Kirk Ruths, MD  02/06/2022 5:21 PM

## 2022-02-07 DIAGNOSIS — I10 Essential (primary) hypertension: Secondary | ICD-10-CM

## 2022-02-07 DIAGNOSIS — N184 Chronic kidney disease, stage 4 (severe): Secondary | ICD-10-CM | POA: Diagnosis not present

## 2022-02-07 DIAGNOSIS — D649 Anemia, unspecified: Secondary | ICD-10-CM | POA: Diagnosis not present

## 2022-02-07 DIAGNOSIS — I480 Paroxysmal atrial fibrillation: Secondary | ICD-10-CM | POA: Diagnosis not present

## 2022-02-07 DIAGNOSIS — I951 Orthostatic hypotension: Secondary | ICD-10-CM | POA: Diagnosis not present

## 2022-02-07 LAB — BASIC METABOLIC PANEL
Anion gap: 9 (ref 5–15)
BUN: 58 mg/dL — ABNORMAL HIGH (ref 8–23)
CO2: 20 mmol/L — ABNORMAL LOW (ref 22–32)
Calcium: 9.4 mg/dL (ref 8.9–10.3)
Chloride: 112 mmol/L — ABNORMAL HIGH (ref 98–111)
Creatinine, Ser: 3.15 mg/dL — ABNORMAL HIGH (ref 0.61–1.24)
GFR, Estimated: 19 mL/min — ABNORMAL LOW (ref >60)
Glucose, Bld: 143 mg/dL — ABNORMAL HIGH (ref 70–99)
Potassium: 5.1 mmol/L (ref 3.5–5.1)
Sodium: 141 mmol/L (ref 135–145)

## 2022-02-07 NOTE — Progress Notes (Addendum)
Pt had syncopal event while talking on phone.  VSS. LOC returned to his normal after 1 to 2 min. Pt did have incontinent stool during this episode. BP did not appear to drop with this episode. BP = 156/113. Dr Olevia Bowens notified.

## 2022-02-07 NOTE — Progress Notes (Signed)
Pt had syncopal episode with drop in BP. LOC returned spontaneously. This is the second episode this morning. Marland Kitchen

## 2022-02-07 NOTE — Significant Event (Signed)
Rapid Response Event Note   Reason for Call :  Syncope  Initial Focused Assessment:  Patient had a syncopal episode at shift change when he was moving his bowels. RN's reported that his BP dropped. His heart remained paced during the event. When I got to the room the patient as alert, conversive, and remembered meeting me the previous week when this similar situation had happened.   Patient has history of orthostatic hypotension, afib, and carcinoid tumor tachybradycardia syndrome. He was just recently started on Droxidopa to mange his orthostatic hypotension.   BP 76/42 (0722)       156/98 (0734) HR 112 O2 96 room air    Interventions:  No interventions except patient assessment and MD discussion  Plan of Care:  Since the patient has returned to his baseline and his vitals are stable, he will remain on 6E.    Event Summary:   MD Notified: Dr. Olevia Bowens notified Call Time: 0722 Arrival Time: 0725 End Time: 0745  Venetia Maxon, RN

## 2022-02-07 NOTE — Progress Notes (Signed)
Progress Note  Patient Name: Alan [name] Date of Encounter: 02/07/2022  Primary Cardiologist: Fransico Him, MD   Subjective   Denies chest pain or sob. Having spells of dizziness.  Inpatient Medications    Scheduled Meds:  aspirin EC  81 mg Oral Daily   droxidopa  100 mg Oral TID WC   fludrocortisone  0.1 mg Oral Daily   heparin  5,000 Units Subcutaneous Q8H   lactose free nutrition  237 mL Oral TID BM   mirtazapine  30 mg Oral QHS   mometasone-formoterol  2 puff Inhalation BID   multivitamin with minerals  1 tablet Oral Daily   potassium chloride SA  20 mEq Oral BID   thiamine  50 mg Oral Daily   Continuous Infusions:  PRN Meds: acetaminophen **OR** acetaminophen, albuterol, diphenoxylate-atropine, ondansetron **OR** ondansetron (ZOFRAN) IV   Vital Signs    Vitals:   02/07/22 0721 02/07/22 0724 02/07/22 0734 02/07/22 0739  BP: (!) 76/42 (!) 91/53 (!) 156/98 (!) 136/90  Pulse: 72 87 81 78  Resp: 18 19 (!) 22 18  Temp:      TempSrc:      SpO2: 98% 99% 96% 97%  Weight:      Height:        Intake/Output Summary (Last 24 hours) at 02/07/2022 0935 Last data filed at 02/07/2022 0500 Gross per 24 hour  Intake --  Output 750 ml  Net -750 ml   Filed Weights   02/05/22 0427 02/06/22 0422 02/07/22 0533  Weight: 58.8 kg 59.5 kg 58.6 kg    Telemetry    Nsr with atrial pacing - Personally Reviewed  ECG    none - Personally Reviewed  Physical Exam   GEN: frail appearing no acute distress.   Neck: 6 cm JVD Cardiac: RRR, no murmurs, rubs, or gallops.  Respiratory: Clear to auscultation bilaterally. GI: Soft, nontender, non-distended  MS: No edema; No deformity. Neuro:  Nonfocal  Psych: Normal affect   Labs    Chemistry Recent Labs  Lab 02/01/22 1748 02/01/22 2157 02/02/22 0300 02/02/22 1220 02/03/22 0851  NA 145  --  145 142 142  K 3.5  --  2.9* 3.7 4.0  CL 115*  --  120* 112* 111  CO2 23  --  19* 22 22  GLUCOSE 131*  --  103* 130* 130*   BUN 42*  --  36* 39* 51*  CREATININE 3.18*   < > 2.47* 2.65* 2.71*  CALCIUM 8.6*  --  7.1* 8.9 8.9  PROT 6.3*  --  4.8*  --   --   ALBUMIN 3.6  --  2.6*  --   --   AST 17  --  13*  --   --   ALT 12  --  11  --   --   ALKPHOS 44  --  34*  --   --   BILITOT 0.7  --  0.7  --   --   GFRNONAA 19*   < > 26* 24* 23*  ANIONGAP 7  --  '6 8 9   '$ < > = values in this interval not displayed.     Hematology Recent Labs  Lab 02/01/22 2157 02/02/22 0300 02/02/22 1220  WBC 9.0 7.2 9.3  RBC 2.82* 2.31*  2.33* 2.94*  HGB 8.8* 7.2* 8.9*  HCT 24.8* 20.7* 26.0*  MCV 87.9 89.6 88.4  MCH 31.2 31.2 30.3  MCHC 35.5 34.8 34.2  RDW 13.9 13.9 13.8  PLT 142* 121* 145*    Cardiac EnzymesNo results for input(s): "TROPONINI" in the last 168 hours. No results for input(s): "TROPIPOC" in the last 168 hours.   BNPNo results for input(s): "BNP", "PROBNP" in the last 168 hours.   DDimer  Recent Labs  Lab 02/01/22 2157  DDIMER 1.47*     Radiology    No results found.  Cardiac Studies   reviewed  Patient Profile     81 y.o. male admitted with recurrent syncope due to orthostasis in the setting of carcinoid/short gut syndrome and over a 100 lb weight loss.   Assessment & Plan    Orthostasis - he has been started on northera. Hopefully his bp will improve. He has a very difficult problem. We discussed the importance of diet.  HTN - his bp is quite labile. In general we would prefer his bp be allowed to run in the 160 range in hopes of preventing syncope.  Weight loss - this portends a poor prognosis. I encouraged him to liberalize the types of foods he consumes and to gravitate to more fat calories.    For questions or updates, please contact [place] Please consult [email or web address] for contact info under Cardiology/STEMI.      Signed, Cristopher Peru, MD  02/07/2022, 9:35 AM

## 2022-02-07 NOTE — Progress Notes (Signed)
Chaplain responded to Code which had been rapidly discharged.  There was no family present.  Chaplain presence not required at this time.  Chaplain service standing by if needed.  [place]

## 2022-02-07 NOTE — Progress Notes (Signed)
[facility] PROGRESS NOTE    Progress Note  Nyan Dufresne Bouza  TDV:[id number] DOB: 1941-01-12 DOA: 02/01/2022 PCP: [name], MD     Brief Narrative:   Rondey Fallen Balingit is an 81 y.o. male with medical history significant of  intestinal carcinoid, chronic diarrhea, CKD stage 3, anemia, paroxysmal atrial fibrillation, pulmonary embolism, tachy brady syndrome sp pacemaker, orthostatic hypotension, who was recently discharged today after  hospital stay 8/3-8/6  for recurrent syncope above his baseline due to progressive orthostatic hypotension. Patient was treated with ivfs with improvement and per wife only had one episode of syncope while in house which is close to his baseline. He was discharged on midodrine and florinef.  Patient states day of discharge he did not feel well. He describes feeling as feeling heavy. He states he did not mention this because he wanted to go home. However at home he continue to feel unwell and had two syncopal episodes with bp 60 systolic in the field per notes.  Patient currently states he has been complaint with his medications, he notes he did have episode of loose stools and note he did not eat well. He notes no chest pain , sob,  n/v/. He does endorse weight loss.    Assessment/Plan:   Orthostatic hypotension: Recurring ongoing and severe patient was discharged on 02/01/2022 and came back with the same symptoms.  Now happening while he is laying down. Cardiology has been consulted, discontinue midodrine started Doxidopa I explained to the patient is a combination of his carcinoid tumor tachybradycardia syndrome and atrial fibrillation.  Paroxysmal atrial fibrillation [facility]): Rate control not a candidate for anticoagulation due to GI bleed.  Anemia of chronic disease: Hemoglobin has remained stable.  Hypomagnesemia/hypokalemia: Now resolved.  History of PE: Lower extremity Doppler was negative for DVT VQ scan showed intermediate to high probability  PE, CT angio of the chest cannot be done due to his chronic kidney disease stage IV. The previous physician spoke with the patient that he clinically does not appear to have a PE especially in the lower extremity DVT has been ruled out he had a very lengthy discussion with the patient and the wife about the risk and benefits of being on anticoagulation versus not being in the light of history of recent GI bleed.  The patient the wife and the previous physician made a mutual decision not to start him on anticoagulation and watch closely for symptoms.  Chronic kidney disease stage IV: Left renal mass follow-up with urology as an outpatient his creatinine remained at baseline.   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   DVT prophylaxis: none Family Communication:wife Status is: Inpatient Remains inpatient appropriate because: Persistent severe orthostatic hypotension    Code Status:     Code Status Orders  (From admission, onward)           Start     Ordered   02/01/22 2105  Full code  Continuous        02/01/22 2106           Code Status History     Date Active Date Inactive Code Status Order ID Comments User Context   01/29/2022 2207 02/01/2022 1507 Full Code [id number]  Rise Patience, MD ED   10/20/2021 0137 10/24/2021 1957 Full Code [id number]  Vianne Bulls, MD ED   04/22/2021 2110 04/24/2021 1620 Full Code [id number]  Shalhoub, Sherryll Burger, MD ED  01/18/2021 2007 01/22/2021 2333 Full Code [id number]  Etta Quill, DO ED   01/17/2021 0009 01/17/2021 2034 Full Code [id number]  Jonnie Finner, DO Inpatient      Advance Directive Documentation    Flowsheet Row Most Recent Value  Type of Advance Directive Healthcare Power of Attorney  Pre-existing out of facility DNR order (yellow form or pink MOST form) --  "MOST" Form in Place? --         IV Access:   Peripheral  IV   Procedures and diagnostic studies:   No results found.   Medical Consultants:   None.   Subjective:    Millan Legan Brizzi had an episode this morning.  Objective:    Vitals:   02/07/22 0721 02/07/22 0724 02/07/22 0734 02/07/22 0739  BP: (!) 76/42 (!) 91/53 (!) 156/98 (!) 136/90  Pulse: 72 87 81 78  Resp: 18 19 (!) 22 18  Temp:      TempSrc:      SpO2: 98% 99% 96% 97%  Weight:      Height:       SpO2: 97 % O2 Flow Rate (L/min): 2 L/min   Intake/Output Summary (Last 24 hours) at 02/07/2022 0809 Last data filed at 02/07/2022 0500 Gross per 24 hour  Intake --  Output 750 ml  Net -750 ml    Filed Weights   02/05/22 0427 02/06/22 0422 02/07/22 0533  Weight: 58.8 kg 59.5 kg 58.6 kg    Exam: General exam: In no acute distress. Respiratory system: Good air movement and clear to auscultation. Cardiovascular system: S1 & S2 heard, RRR. No JVD. Gastrointestinal system: Abdomen is nondistended, soft and nontender.  Extremities: No pedal edema. Skin: No rashes, lesions or ulcers Psychiatry: Judgement and insight appear normal. Mood & affect appropriate. Data Reviewed:    Labs: Basic Metabolic Panel: Recent Labs  Lab 02/01/22 1748 02/01/22 2157 02/02/22 0300 02/02/22 1220 02/03/22 0851  NA 145  --  145 142 142  K 3.5  --  2.9* 3.7 4.0  CL 115*  --  120* 112* 111  CO2 23  --  19* 22 22  GLUCOSE 131*  --  103* 130* 130*  BUN 42*  --  36* 39* 51*  CREATININE 3.18* 3.02* 2.47* 2.65* 2.71*  CALCIUM 8.6*  --  7.1* 8.9 8.9  MG  --  1.5*  --  2.0 1.8    GFR Estimated Creatinine Clearance: 18 mL/min (A) (by C-G formula based on SCr of 2.71 mg/dL (H)). Liver Function Tests: Recent Labs  Lab 02/01/22 1748 02/02/22 0300  AST 17 13*  ALT 12 11  ALKPHOS 44 34*  BILITOT 0.7 0.7  PROT 6.3* 4.8*  ALBUMIN 3.6 2.6*    No results for input(s): "LIPASE", "AMYLASE" in the last 168 hours. No results for input(s): "AMMONIA" in the last 168 hours. Coagulation  profile No results for input(s): "INR", "PROTIME" in the last 168 hours. COVID-19 Labs  No results for input(s): "DDIMER", "FERRITIN", "LDH", "CRP" in the last 72 hours.  Lab Results  Component Value Date   SARSCOV2NAA NEGATIVE 04/22/2021   [place] NEGATIVE 01/16/2021    CBC: Recent Labs  Lab 02/01/22 1748 02/01/22 2157 02/02/22 0300 02/02/22 1220  WBC 9.2 9.0 7.2 9.3  NEUTROABS 7.8*  --   --   --   HGB 9.2* 8.8* 7.2* 8.9*  HCT 26.0* 24.8* 20.7* 26.0*  MCV 87.8 87.9 89.6 88.4  PLT 151 142* 121* 145*  Cardiac Enzymes: No results for input(s): "CKTOTAL", "CKMB", "CKMBINDEX", "TROPONINI" in the last 168 hours. BNP (last 3 results) No results for input(s): "PROBNP" in the last 8760 hours. CBG: Recent Labs  Lab 01/31/22 1112 02/01/22 2146 02/05/22 1233 02/05/22 1618  GLUCAP 102* 101* 164* 152*    D-Dimer: No results for input(s): "DDIMER" in the last 72 hours. Hgb A1c: No results for input(s): "HGBA1C" in the last 72 hours. Lipid Profile: No results for input(s): "CHOL", "HDL", "LDLCALC", "TRIG", "CHOLHDL", "LDLDIRECT" in the last 72 hours. Thyroid function studies: No results for input(s): "TSH", "T4TOTAL", "T3FREE", "THYROIDAB" in the last 72 hours.  Invalid input(s): "FREET3" Anemia work up: No results for input(s): "VITAMINB12", "FOLATE", "FERRITIN", "TIBC", "IRON", "RETICCTPCT" in the last 72 hours. Sepsis Labs: Recent Labs  Lab 02/01/22 1748 02/01/22 2157 02/02/22 0300 02/02/22 1220  WBC 9.2 9.0 7.2 9.3    Microbiology Recent Results (from the past 240 hour(s))  WOUND CULTURE     Status: None   Collection Time: 01/28/22 12:31 PM   Specimen: Wound  Result Value Ref Range Status   Source: R UPPER BACK  Final   Status: FINAL  Final   Gram Stain:   Final    Moderate White blood cells seen Rare epithelial cells Few Gram positive cocci in clusters   RESULT:   Final    Growth of skin flora (note: Growth does not include S. aureus,  beta-hemolytic Streptococci or P. aeruginosa).     Medications:    aspirin EC  81 mg Oral Daily   droxidopa  100 mg Oral TID WC   fludrocortisone  0.1 mg Oral Daily   heparin  5,000 Units Subcutaneous Q8H   lactose free nutrition  237 mL Oral TID BM   mirtazapine  30 mg Oral QHS   mometasone-formoterol  2 puff Inhalation BID   multivitamin with minerals  1 tablet Oral Daily   potassium chloride SA  20 mEq Oral BID   thiamine  50 mg Oral Daily   Continuous Infusions:    LOS: 6 days   Charlynne Cousins  [facility]  02/07/2022, 8:09 AM

## 2022-02-07 NOTE — Progress Notes (Signed)
Pt had RRT called for another syncopal episode at shift change with drop in BP. HR remained paced. Pt was on bedpan having BM. BP stabilized without intervention. Dr Olevia Bowens notified.

## 2022-02-08 DIAGNOSIS — I10 Essential (primary) hypertension: Secondary | ICD-10-CM | POA: Diagnosis not present

## 2022-02-08 DIAGNOSIS — R55 Syncope and collapse: Secondary | ICD-10-CM | POA: Diagnosis not present

## 2022-02-08 DIAGNOSIS — I951 Orthostatic hypotension: Secondary | ICD-10-CM | POA: Diagnosis not present

## 2022-02-08 NOTE — Progress Notes (Signed)
[facility] PROGRESS NOTE    Progress Note  Alan [name]  ZOX:[id number] DOB: 01-Apr-1941 DOA: 02/01/2022 PCP: [name], MD     Brief Narrative:   Alan [name] is an 81 y.o. male with medical history significant of  intestinal carcinoid, chronic diarrhea, CKD stage 3, anemia, paroxysmal atrial fibrillation, pulmonary embolism, tachy brady syndrome sp pacemaker, orthostatic hypotension, who was recently discharged today after  hospital stay 8/3-8/6  for recurrent syncope above his baseline due to progressive orthostatic hypotension. Patient was treated with ivfs with improvement and per wife only had one episode of syncope while in house which is close to his baseline. He was discharged on midodrine and florinef.  Patient states day of discharge he did not feel well. He describes feeling as feeling heavy. He states he did not mention this because he wanted to go home. However at home he continue to feel unwell and had two syncopal episodes with bp 60 systolic in the field per notes.  Patient currently states he has been complaint with his medications, he notes he did have episode of loose stools and note he did not eat well. He notes no chest pain , sob,  n/v/. He does endorse weight loss.    Assessment/Plan:   Orthostatic hypotension: Recurring ongoing and severe patient was discharged on 02/01/2022 and came back with the same symptoms.   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 Relates that he feels that the symptoms are less frequent.  Paroxysmal atrial fibrillation [facility]): Rate control not a candidate for anticoagulation due to GI bleed.  Anemia of chronic disease: Hemoglobin has remained stable.  Hypomagnesemia/hypokalemia: Now resolved.  History of PE: Lower extremity Doppler was negative for DVT VQ scan showed intermediate to high  probability PE, CT angio of the chest cannot be done due to his chronic kidney disease stage IV. The previous physician spoke with the patient that he clinically does not appear to have a PE especially in the lower extremity DVT has been ruled out he had a very lengthy discussion with the patient and the wife about the risk and benefits of being on anticoagulation versus not being in the light of history of recent GI bleed.  The patient the wife and the previous physician made a mutual decision not to start him on anticoagulation and watch closely for symptoms.  Chronic kidney disease stage IV: Left renal mass follow-up with urology as an outpatient his creatinine remained at baseline.   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   DVT prophylaxis: none Family Communication:wife Status is: Inpatient Remains inpatient appropriate because: Persistent severe orthostatic hypotension    Code Status:     Code Status Orders  (From admission, onward)           Start     Ordered   02/01/22 2105  Full code  Continuous        02/01/22 2106           Code Status History     Date Active Date Inactive Code Status Order ID Comments User Context   01/29/2022 2207 02/01/2022 1507 Full Code [id number]  Rise Patience, MD ED   10/20/2021 0137 10/24/2021 1957 Full Code [id number]  Vianne Bulls, MD ED   04/22/2021 2110 04/24/2021 1620 Full Code [id number]  Shalhoub,  Sherryll Burger, MD ED   01/18/2021 2007 01/22/2021 2333 Full Code [id number]  Etta Quill, DO ED   01/17/2021 0009 01/17/2021 2034 Full Code [id number]  Jonnie Finner, DO Inpatient      Advance Directive Documentation    Flowsheet Row Most Recent Value  Type of Advance Directive Healthcare Power of Attorney  Pre-existing out of facility DNR order (yellow form or pink MOST form) --  "MOST" Form in Place? --         IV Access:    Peripheral IV   Procedures and diagnostic studies:   No results found.   Medical Consultants:   None.   Subjective:    Alan [name] had no episodes since yesterday.  Objective:    Vitals:   02/08/22 0001 02/08/22 0453 02/08/22 0500 02/08/22 0748  BP: (!) 177/94 (!) 188/93    Pulse: 69 75    Resp: 17 16    Temp: 97.8 F (36.6 C) 97.8 F (36.6 C)    TempSrc: Oral Oral    SpO2: 100% 100%  100%  Weight:   59 kg   Height:       SpO2: 100 % O2 Flow Rate (L/min): 2 L/min   Intake/Output Summary (Last 24 hours) at 02/08/2022 0828 Last data filed at 02/08/2022 0500 Gross per 24 hour  Intake 480 ml  Output 950 ml  Net -470 ml    Filed Weights   02/06/22 0422 02/07/22 0533 02/08/22 0500  Weight: 59.5 kg 58.6 kg 59 kg    Exam: General exam: In no acute distress. Respiratory system: Good air movement and clear to auscultation. Cardiovascular system: S1 & S2 heard, RRR. No JVD. Gastrointestinal system: Abdomen is nondistended, soft and nontender.  Extremities: No pedal edema. Skin: No rashes, lesions or ulcers Psychiatry: Judgement and insight appear normal. Mood & affect appropriate. Data Reviewed:    Labs: Basic Metabolic Panel: Recent Labs  Lab 02/01/22 1748 02/01/22 2157 02/02/22 0300 02/02/22 1220 02/03/22 0851 02/07/22 0946  NA 145  --  145 142 142 141  K 3.5  --  2.9* 3.7 4.0 5.1  CL 115*  --  120* 112* 111 112*  CO2 23  --  19* 22 22 20*  GLUCOSE 131*  --  103* 130* 130* 143*  BUN 42*  --  36* 39* 51* 58*  CREATININE 3.18* 3.02* 2.47* 2.65* 2.71* 3.15*  CALCIUM 8.6*  --  7.1* 8.9 8.9 9.4  MG  --  1.5*  --  2.0 1.8  --     GFR Estimated Creatinine Clearance: 15.6 mL/min (A) (by C-G formula based on SCr of 3.15 mg/dL (H)). Liver Function Tests: Recent Labs  Lab 02/01/22 1748 02/02/22 0300  AST 17 13*  ALT 12 11  ALKPHOS 44 34*  BILITOT 0.7 0.7  PROT 6.3* 4.8*  ALBUMIN 3.6 2.6*    No results for input(s): "LIPASE",  "AMYLASE" in the last 168 hours. No results for input(s): "AMMONIA" in the last 168 hours. Coagulation profile No results for input(s): "INR", "PROTIME" in the last 168 hours. COVID-19 Labs  No results for input(s): "DDIMER", "FERRITIN", "LDH", "CRP" in the last 72 hours.  Lab Results  Component Value Date   SARSCOV2NAA NEGATIVE 04/22/2021   [place] NEGATIVE 01/16/2021    CBC: Recent Labs  Lab 02/01/22 1748 02/01/22 2157 02/02/22 0300 02/02/22 1220  WBC 9.2 9.0 7.2 9.3  NEUTROABS 7.8*  --   --   --   HGB 9.2*  8.8* 7.2* 8.9*  HCT 26.0* 24.8* 20.7* 26.0*  MCV 87.8 87.9 89.6 88.4  PLT 151 142* 121* 145*    Cardiac Enzymes: No results for input(s): "CKTOTAL", "CKMB", "CKMBINDEX", "TROPONINI" in the last 168 hours. BNP (last 3 results) No results for input(s): "PROBNP" in the last 8760 hours. CBG: Recent Labs  Lab 02/01/22 2146 02/05/22 1233 02/05/22 1618  GLUCAP 101* 164* 152*    D-Dimer: No results for input(s): "DDIMER" in the last 72 hours. Hgb A1c: No results for input(s): "HGBA1C" in the last 72 hours. Lipid Profile: No results for input(s): "CHOL", "HDL", "LDLCALC", "TRIG", "CHOLHDL", "LDLDIRECT" in the last 72 hours. Thyroid function studies: No results for input(s): "TSH", "T4TOTAL", "T3FREE", "THYROIDAB" in the last 72 hours.  Invalid input(s): "FREET3" Anemia work up: No results for input(s): "VITAMINB12", "FOLATE", "FERRITIN", "TIBC", "IRON", "RETICCTPCT" in the last 72 hours. Sepsis Labs: Recent Labs  Lab 02/01/22 1748 02/01/22 2157 02/02/22 0300 02/02/22 1220  WBC 9.2 9.0 7.2 9.3    Microbiology No results found for this or any previous visit (from the past 240 hour(s)).    Medications:    aspirin EC  81 mg Oral Daily   droxidopa  100 mg Oral TID WC   fludrocortisone  0.1 mg Oral Daily   heparin  5,000 Units Subcutaneous Q8H   lactose free nutrition  237 mL Oral TID BM   mirtazapine  30 mg Oral QHS   mometasone-formoterol  2  puff Inhalation BID   multivitamin with minerals  1 tablet Oral Daily   thiamine  50 mg Oral Daily   Continuous Infusions:    LOS: 7 days   Charlynne Cousins  [facility]  02/08/2022, 8:28 AM

## 2022-02-08 NOTE — Progress Notes (Addendum)
SBP =180's, asymptomatic. On droxidopa. Per cardio notes, prefer SBP on 160's range to prevent syncope. On droxidopa. Updated J. Howerter, DO.Will monitor.

## 2022-02-08 NOTE — Plan of Care (Signed)
  Problem: Education: Goal: Knowledge of General Education information will improve Description: Including pain rating scale, medication(s)/side effects and non-pharmacologic comfort measures Outcome: Progressing   Problem: Health Behavior/Discharge Planning: Goal: Ability to manage health-related needs will improve Outcome: Progressing   Problem: Clinical Measurements: Goal: Cardiovascular complication will be avoided Outcome: Progressing   Problem: Pain Managment: Goal: General experience of comfort will improve Outcome: Progressing   Problem: Safety: Goal: Ability to remain free from injury will improve Outcome: Progressing   Problem: Skin Integrity: Goal: Risk for impaired skin integrity will decrease Outcome: Progressing   

## 2022-02-08 NOTE — Progress Notes (Signed)
Progress Note  Patient Name: Alan [name] Date of Encounter: 02/08/2022  Primary Cardiologist: Fransico Him, MD   Subjective   "I feel better today."   Inpatient Medications    Scheduled Meds:  aspirin EC  81 mg Oral Daily   droxidopa  100 mg Oral TID WC   fludrocortisone  0.1 mg Oral Daily   heparin  5,000 Units Subcutaneous Q8H   lactose free nutrition  237 mL Oral TID BM   mirtazapine  30 mg Oral QHS   mometasone-formoterol  2 puff Inhalation BID   multivitamin with minerals  1 tablet Oral Daily   thiamine  50 mg Oral Daily   Continuous Infusions:  PRN Meds: acetaminophen **OR** acetaminophen, albuterol, diphenoxylate-atropine, ondansetron **OR** ondansetron (ZOFRAN) IV   Vital Signs    Vitals:   02/08/22 0453 02/08/22 0500 02/08/22 0748 02/08/22 0908  BP: (!) 188/93   (!) 182/98  Pulse: 75   66  Resp: 16   17  Temp: 97.8 F (36.6 C)   98 F (36.7 C)  TempSrc: Oral   Oral  SpO2: 100%  100% 95%  Weight:  59 kg    Height:        Intake/Output Summary (Last 24 hours) at 02/08/2022 1000 Last data filed at 02/08/2022 0500 Gross per 24 hour  Intake 480 ml  Output 950 ml  Net -470 ml   Filed Weights   02/06/22 0422 02/07/22 0533 02/08/22 0500  Weight: 59.5 kg 58.6 kg 59 kg    Telemetry    nsr - Personally Reviewed  ECG    none - Personally Reviewed  Physical Exam   GEN: No acute distress.   Neck: No JVD Cardiac: RRR, no murmurs, rubs, or gallops.  Respiratory: Clear to auscultation bilaterally. GI: Soft, nontender, non-distended  MS: No edema; No deformity. Neuro:  Nonfocal  Psych: Normal affect   Labs    Chemistry Recent Labs  Lab 02/01/22 1748 02/01/22 2157 02/02/22 0300 02/02/22 1220 02/03/22 0851 02/07/22 0946  NA 145  --  145 142 142 141  K 3.5  --  2.9* 3.7 4.0 5.1  CL 115*  --  120* 112* 111 112*  CO2 23  --  19* 22 22 20*  GLUCOSE 131*  --  103* 130* 130* 143*  BUN 42*  --  36* 39* 51* 58*  CREATININE 3.18*   < >  2.47* 2.65* 2.71* 3.15*  CALCIUM 8.6*  --  7.1* 8.9 8.9 9.4  PROT 6.3*  --  4.8*  --   --   --   ALBUMIN 3.6  --  2.6*  --   --   --   AST 17  --  13*  --   --   --   ALT 12  --  11  --   --   --   ALKPHOS 44  --  34*  --   --   --   BILITOT 0.7  --  0.7  --   --   --   GFRNONAA 19*   < > 26* 24* 23* 19*  ANIONGAP 7  --  '6 8 9 9   '$ < > = values in this interval not displayed.     Hematology Recent Labs  Lab 02/01/22 2157 02/02/22 0300 02/02/22 1220  WBC 9.0 7.2 9.3  RBC 2.82* 2.31*  2.33* 2.94*  HGB 8.8* 7.2* 8.9*  HCT 24.8* 20.7* 26.0*  MCV 87.9 89.6 88.4  MCH 31.2 31.2 30.3  MCHC 35.5 34.8 34.2  RDW 13.9 13.9 13.8  PLT 142* 121* 145*    Cardiac EnzymesNo results for input(s): "TROPONINI" in the last 168 hours. No results for input(s): "TROPIPOC" in the last 168 hours.   BNPNo results for input(s): "BNP", "PROBNP" in the last 168 hours.   DDimer  Recent Labs  Lab 02/01/22 2157  DDIMER 1.47*     Radiology    No results found.  Cardiac Studies   none  Patient Profile     81 y.o. 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   Assessment & Plan    Orthostasis - he feels much better today. Ok to try to ambulate. Northera appears to be benficial. HTN -his bp is high today but in light of his propensity for hypotension, I would recommend he be allowed to keep his bp elevated in hopes of preventing syncope.  Weight loss - I have encouraged the patient to eat foods high in fat and sodium.     For questions or updates, please contact [place] Please consult [email or web address] for contact info under Cardiology/STEMI.      Signed, Cristopher Peru, MD  02/08/2022, 10:00 AM

## 2022-02-08 NOTE — Plan of Care (Signed)
  Problem: Education: Goal: Knowledge of General Education information will improve Description: Including pain rating scale, medication(s)/side effects and non-pharmacologic comfort measures 02/08/2022 2358 by Kaylyn Lim, RN Outcome: Progressing 02/08/2022 2358 by Kaylyn Lim, RN Outcome: Progressing   Problem: Health Behavior/Discharge Planning: Goal: Ability to manage health-related needs will improve 02/08/2022 2358 by Kaylyn Lim, RN Outcome: Progressing 02/08/2022 2358 by Kaylyn Lim, RN Outcome: Progressing   Problem: Clinical Measurements: Goal: Ability to maintain clinical measurements within normal limits will improve 02/08/2022 2358 by Kaylyn Lim, RN Outcome: Progressing 02/08/2022 2358 by Kaylyn Lim, RN Outcome: Progressing   Problem: Clinical Measurements: Goal: Respiratory complications will improve 02/08/2022 2358 by Kaylyn Lim, RN Outcome: Progressing 02/08/2022 2358 by Kaylyn Lim, RN Outcome: Progressing   Problem: Clinical Measurements: Goal: Cardiovascular complication will be avoided 02/08/2022 2358 by Kaylyn Lim, RN Outcome: Progressing 02/08/2022 2358 by Kaylyn Lim, RN Outcome: Progressing   Problem: Activity: Goal: Risk for activity intolerance will decrease 02/08/2022 2358 by Kaylyn Lim, RN Outcome: Progressing 02/08/2022 2358 by Kaylyn Lim, RN Outcome: Progressing   Problem: Nutrition: Goal: Adequate nutrition will be maintained 02/08/2022 2358 by Kaylyn Lim, RN Outcome: Progressing 02/08/2022 2358 by Kaylyn Lim, RN Outcome: Progressing   Problem: Coping: Goal: Level of anxiety will decrease 02/08/2022 2358 by Kaylyn Lim, RN Outcome: Progressing 02/08/2022 2358 by Kaylyn Lim, RN Outcome: Progressing   Problem: Pain Managment: Goal: General experience of comfort will improve Outcome: Progressing   Problem: Safety: Goal: Ability to remain free from injury will improve Outcome: Progressing   Problem: Skin  Integrity: Goal: Risk for impaired skin integrity will decrease Outcome: Progressing

## 2022-02-09 DIAGNOSIS — C7A019 Malignant carcinoid tumor of the small intestine, unspecified portion: Secondary | ICD-10-CM | POA: Diagnosis not present

## 2022-02-09 DIAGNOSIS — J45909 Unspecified asthma, uncomplicated: Secondary | ICD-10-CM | POA: Diagnosis not present

## 2022-02-09 DIAGNOSIS — N184 Chronic kidney disease, stage 4 (severe): Secondary | ICD-10-CM | POA: Diagnosis not present

## 2022-02-09 DIAGNOSIS — D649 Anemia, unspecified: Secondary | ICD-10-CM | POA: Diagnosis not present

## 2022-02-09 DIAGNOSIS — D500 Iron deficiency anemia secondary to blood loss (chronic): Secondary | ICD-10-CM | POA: Diagnosis not present

## 2022-02-09 DIAGNOSIS — J449 Chronic obstructive pulmonary disease, unspecified: Secondary | ICD-10-CM | POA: Diagnosis not present

## 2022-02-09 DIAGNOSIS — C7A00 Malignant carcinoid tumor of unspecified site: Secondary | ICD-10-CM | POA: Diagnosis not present

## 2022-02-09 DIAGNOSIS — R531 Weakness: Secondary | ICD-10-CM | POA: Diagnosis not present

## 2022-02-09 DIAGNOSIS — I495 Sick sinus syndrome: Secondary | ICD-10-CM | POA: Diagnosis not present

## 2022-02-09 DIAGNOSIS — I10 Essential (primary) hypertension: Secondary | ICD-10-CM | POA: Diagnosis not present

## 2022-02-09 DIAGNOSIS — I1310 Hypertensive heart and chronic kidney disease without heart failure, with stage 1 through stage 4 chronic kidney disease, or unspecified chronic kidney disease: Secondary | ICD-10-CM | POA: Diagnosis not present

## 2022-02-09 DIAGNOSIS — I129 Hypertensive chronic kidney disease with stage 1 through stage 4 chronic kidney disease, or unspecified chronic kidney disease: Secondary | ICD-10-CM | POA: Diagnosis not present

## 2022-02-09 DIAGNOSIS — I480 Paroxysmal atrial fibrillation: Secondary | ICD-10-CM | POA: Diagnosis not present

## 2022-02-09 DIAGNOSIS — E43 Unspecified severe protein-calorie malnutrition: Secondary | ICD-10-CM | POA: Diagnosis not present

## 2022-02-09 DIAGNOSIS — R2689 Other abnormalities of gait and mobility: Secondary | ICD-10-CM | POA: Diagnosis not present

## 2022-02-09 DIAGNOSIS — H3530 Unspecified macular degeneration: Secondary | ICD-10-CM | POA: Diagnosis not present

## 2022-02-09 DIAGNOSIS — R1310 Dysphagia, unspecified: Secondary | ICD-10-CM | POA: Diagnosis not present

## 2022-02-09 DIAGNOSIS — I951 Orthostatic hypotension: Secondary | ICD-10-CM | POA: Diagnosis not present

## 2022-02-09 DIAGNOSIS — Z950 Presence of cardiac pacemaker: Secondary | ICD-10-CM | POA: Diagnosis not present

## 2022-02-09 DIAGNOSIS — N1832 Chronic kidney disease, stage 3b: Secondary | ICD-10-CM | POA: Diagnosis not present

## 2022-02-09 DIAGNOSIS — I4891 Unspecified atrial fibrillation: Secondary | ICD-10-CM | POA: Diagnosis not present

## 2022-02-09 DIAGNOSIS — Z7401 Bed confinement status: Secondary | ICD-10-CM | POA: Diagnosis not present

## 2022-02-09 DIAGNOSIS — R1312 Dysphagia, oropharyngeal phase: Secondary | ICD-10-CM | POA: Diagnosis not present

## 2022-02-09 DIAGNOSIS — K529 Noninfective gastroenteritis and colitis, unspecified: Secondary | ICD-10-CM | POA: Diagnosis not present

## 2022-02-09 DIAGNOSIS — M6281 Muscle weakness (generalized): Secondary | ICD-10-CM | POA: Diagnosis not present

## 2022-02-09 DIAGNOSIS — K909 Intestinal malabsorption, unspecified: Secondary | ICD-10-CM | POA: Diagnosis not present

## 2022-02-09 DIAGNOSIS — R55 Syncope and collapse: Secondary | ICD-10-CM | POA: Diagnosis not present

## 2022-02-09 DIAGNOSIS — Z9181 History of falling: Secondary | ICD-10-CM | POA: Diagnosis not present

## 2022-02-09 DIAGNOSIS — R2681 Unsteadiness on feet: Secondary | ICD-10-CM | POA: Diagnosis not present

## 2022-02-09 MED ORDER — DROXIDOPA 100 MG PO CAPS: 100.0000 mg | ORAL_CAPSULE | Freq: Three times a day (TID) | ORAL | 2 refills | Status: DC

## 2022-02-09 NOTE — Progress Notes (Signed)
Report given to [employer]. Also advised that Northera was given late this morning at 1000, so 1200 dose has not been given yet.

## 2022-02-09 NOTE — Progress Notes (Signed)
Progress Note  Patient Name: Alan [name] Date of Encounter: 02/09/2022  [facility] Cardiologist: Fransico Him, MD   Subjective   Feels OK. Eager to go home, hospital is causing him to feel depressed.  SBP 140s overnight, 180s after waking.   Inpatient Medications    Scheduled Meds:  aspirin EC  81 mg Oral Daily   droxidopa  100 mg Oral TID WC   fludrocortisone  0.1 mg Oral Daily   heparin  5,000 Units Subcutaneous Q8H   lactose free nutrition  237 mL Oral TID BM   mirtazapine  30 mg Oral QHS   mometasone-formoterol  2 puff Inhalation BID   multivitamin with minerals  1 tablet Oral Daily   thiamine  50 mg Oral Daily   Continuous Infusions:  PRN Meds: acetaminophen **OR** acetaminophen, albuterol, diphenoxylate-atropine, ondansetron **OR** ondansetron (ZOFRAN) IV   Vital Signs    Vitals:   02/09/22 0333 02/09/22 0342 02/09/22 0356 02/09/22 0431  BP: (!) 200/100 (!) 190/90  (!) 148/80  Pulse: 79 81  72  Resp: '18 17  17  '$ Temp:  97.8 F (36.6 C)    TempSrc:  Oral    SpO2:  100%    Weight:   59 kg   Height:        Intake/Output Summary (Last 24 hours) at 02/09/2022 0856 Last data filed at 02/09/2022 0500 Gross per 24 hour  Intake 680 ml  Output 1800 ml  Net -1120 ml      02/09/2022    3:56 AM 02/08/2022    5:00 AM 02/07/2022    5:33 AM  Last 3 Weights  Weight (lbs) 130 lb 1.1 oz 130 lb 129 lb 3 oz  Weight (kg) 59 kg 58.968 kg 58.6 kg      Telemetry    A-paced V-sensed - Personally Reviewed  ECG    No new tracing - Personally Reviewed  Physical Exam  Very lean GEN: No acute distress.   Neck: No JVD Cardiac: RRR, no murmurs, rubs, or gallops.  Respiratory: Clear to auscultation bilaterally. GI: Soft, nontender, non-distended  MS: No edema; No deformity. Neuro:  Nonfocal  Psych: Normal affect   Labs    High Sensitivity Troponin:   Recent Labs  Lab 01/29/22 1843 02/01/22 1748 02/01/22 1908 02/01/22 2157 02/01/22 2256  TROPONINIHS  30* 28* 24* 32* 28*     Chemistry Recent Labs  Lab 02/02/22 1220 02/03/22 0851 02/07/22 0946  NA 142 142 141  K 3.7 4.0 5.1  CL 112* 111 112*  CO2 22 22 20*  GLUCOSE 130* 130* 143*  BUN 39* 51* 58*  CREATININE 2.65* 2.71* 3.15*  CALCIUM 8.9 8.9 9.4  MG 2.0 1.8  --   GFRNONAA 24* 23* 19*  ANIONGAP '8 9 9    '$ Lipids No results for input(s): "CHOL", "TRIG", "HDL", "LABVLDL", "LDLCALC", "CHOLHDL" in the last 168 hours.  Hematology Recent Labs  Lab 02/02/22 1220  WBC 9.3  RBC 2.94*  HGB 8.9*  HCT 26.0*  MCV 88.4  MCH 30.3  MCHC 34.2  RDW 13.8  PLT 145*   Thyroid No results for input(s): "TSH", "FREET4" in the last 168 hours.  BNPNo results for input(s): "BNP", "PROBNP" in the last 168 hours.  DDimer No results for input(s): "DDIMER" in the last 168 hours.   Radiology    No results found.  Cardiac Studies  Normal pacemaker check 07/26/20232  Echo 01/17/2021   1. Left ventricular ejection fraction, by estimation,  is 55 to 60%. The  left ventricle has normal function. The left ventricle has no regional  wall motion abnormalities. There is mild left ventricular hypertrophy of  the basal-septal segment. Left  ventricular diastolic parameters were normal.   2. Right ventricular systolic function is normal. The right ventricular  size is normal.   3. The mitral valve is normal in structure. No evidence of mitral valve  regurgitation. No evidence of mitral stenosis.   4. The aortic valve is tricuspid. Aortic valve regurgitation is not  visualized. No aortic stenosis is present.   5. The inferior vena cava is normal in size with greater than 50%  respiratory variability, suggesting right atrial pressure of 3 mmHg.   Patient Profile     81 y.o. 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  Assessment & Plan     [facility] will sign off.   Medication Recommendations:  continue current doses of fludrocortisone and  Northerra Other recommendations (labs, testing, etc):  repeat BMET as OP. Wear abdominal binder when upright. Follow up as an outpatient:  has appointment already scheduled 08/16 w Dr. Radford Pax  For questions or updates, please contact [place] Please consult [email or web address] for contact info under        Signed, Sanda Klein, MD  02/09/2022, 8:56 AM

## 2022-02-09 NOTE — TOC Transition Note (Signed)
Transition of Care [facility]) - CM/SW Discharge Note   Patient Details  Name: Alan [name] MRN: [id number] Date of Birth: [birth year]  [facility] of [facility] CM/SW Contact:  Milas Gain, [place] Phone Number: 02/09/2022, 10:28 AM   Clinical Narrative:     Patient will DC to: [facility] date: 02/09/2022  Family notified: [name]   Transport by: Corey Harold  ?  Per MD patient ready for DC to [facility]. RN, patient, patient's family, and facility notified of DC. Discharge Summary sent to facility. RN given number for report tele# [phone] RM# [phone]. DC packet on chart. Ambulance transport requested for patient.  CSW signing off.   Final next level of care: Skilled Nursing Facility Barriers to Discharge: No Barriers Identified   Patient Goals and CMS Choice Patient states their goals for this hospitalization and ongoing recovery are:: SNF CMS Medicare.gov Compare Post Acute Care list provided to:: Patient Choice offered to / list presented to : Patient, Spouse  Discharge Placement              Patient chooses bed at: [facility] Patient to be transferred to facility by: [facility] Name of family member notified: [name] Patient and family notified of of transfer: 02/09/22  Discharge Plan and Services In-house Referral: Clinical Social Work                                   Social Determinants of Health (SDOH) Interventions     Readmission Risk Interventions    02/05/2022   12:11 PM  Readmission Risk Prevention Plan  Transportation Screening Complete  PCP or Specialist Appt within 5-7 Days Complete  Home Care Screening Complete  Medication Review (RN CM) Complete

## 2022-02-09 NOTE — Progress Notes (Signed)
Physical Therapy Treatment Patient Details Name: Alan [name] MRN: [id number] DOB: 10/18/1940 Today's Date: 02/09/2022   History of Present Illness Pt is an 81 y/o male admitted secondary to syncope likely from orthostatic hypotension. Recent admission for the same. PMH includes CKD, a fib, carcinoid of intestine, pacemaker, and PE.    PT Comments    Patient not progressing towards PT goals due to continued drop in BP with changes in position. Today, pt tolerated bed mobility and multiple standing bouts with Min guard-supervision for safety. Reports lightheadedness and weakness with standing. Not able to tolerate standing for longer than 30 seconds to obtain BP due to worsening symptoms. Able to take a few steps in room to get to chair to try to increase upright tolerance and therefore BP stability. See BP below. Supine BP 167/95 Sitting BP 141/130 Standing BP 86/42, (not able to tolerate standing long enough to take BP in standing) Sitting BP post transfer 105/64.  Symptomatic throughout change in position, never fully resolving despite there ex. Encouraged OOB to chair as much as tolerated. Continues to be appropriate for SNF due to inability to progress mobility secondary to symptoms and high risk of syncope. Will follow.     Recommendations for follow up therapy are one component of a multi-disciplinary discharge planning process, led by the attending physician.  Recommendations may be updated based on patient status, additional functional criteria and insurance authorization.  Follow Up Recommendations  Skilled nursing-short term rehab (<3 hours/day) Can patient physically be transported by private vehicle: Yes   Assistance Recommended at Discharge Frequent or constant Supervision/Assistance  Patient can return home with the following A little help with walking and/or transfers;Assistance with cooking/housework;Assist for transportation;Help with stairs or ramp for entrance;A little  help with bathing/dressing/bathroom   Equipment Recommendations  Wheelchair (measurements PT)    Recommendations for Other Services       Precautions / Restrictions Precautions Precautions: Fall;Other (comment) Precaution Comments: syncope, watch BP Restrictions Weight Bearing Restrictions: No     Mobility  Bed Mobility Overal bed mobility: Needs Assistance Bed Mobility: Supine to Sit     Supine to sit: Supervision, HOB elevated     General bed mobility comments: Supervision for safety, no assist needed. + lighheadedness reported.    Transfers Overall transfer level: Needs assistance Equipment used: Rolling walker (2 wheels) Transfers: Sit to/from Stand Sit to Stand: Min guard   Step pivot transfers: Min guard, Min assist       General transfer comment: Stood from EOB x2, + lightheadedness both times. Able to walk a short distance to chair with Min guard-Min assist for balance/line management.    Ambulation/Gait Ambulation/Gait assistance: Min guard, Min assist Gait Distance (Feet): 3 Feet Assistive device: Rolling walker (2 wheels)         General Gait Details: Able to take a few steps to get to chair with directional cues due to line management., +lightheadedness.   Stairs             Wheelchair Mobility    Modified Rankin (Stroke Patients Only)       Balance Overall balance assessment: Needs assistance Sitting-balance support: No upper extremity supported, Feet supported Sitting balance-Leahy Scale: Fair     Standing balance support: During functional activity Standing balance-Leahy Scale: Poor Standing balance comment: Requires UE support and close min guard due to dizziness.  Cognition Arousal/Alertness: Awake/alert Behavior During Therapy: WFL for tasks assessed/performed Overall Cognitive Status: Impaired/Different from baseline Area of Impairment: Safety/judgement, Awareness                          Safety/Judgement: Decreased awareness of safety, Decreased awareness of deficits Awareness: Emergent   General Comments: Seems to better understand why he cannot get up and walk right now due to drop in BP and likely to pass out, asking to sit when symptomatic in standing. Still with decreased insight into deficits.        Exercises      General Comments General comments (skin integrity, edema, etc.): Supine BP 167/95, sitting BP 141/130, standing BP 86/42, (not able to tolerate standing long enough to take BP in standing ), sitting BP post transfer 105/64. Symptomatic throughout change in position, never fully resolving despite there ex.      Pertinent Vitals/Pain Pain Assessment Pain Assessment: No/denies pain    Home Living                          Prior Function            PT Goals (current goals can now be found in the care plan section) Progress towards PT goals: Not progressing toward goals - comment (due to BP)    Frequency    Min 2X/week      PT Plan Current plan remains appropriate    Co-evaluation              AM-PAC PT "6 Clicks" Mobility   Outcome Measure  Help needed turning from your back to your side while in a flat bed without using bedrails?: A Little Help needed moving from lying on your back to sitting on the side of a flat bed without using bedrails?: A Little Help needed moving to and from a bed to a chair (including a wheelchair)?: A Little Help needed standing up from a chair using your arms (e.g., wheelchair or bedside chair)?: A Little Help needed to walk in hospital room?: Total Help needed climbing 3-5 steps with a railing? : Total 6 Click Score: 14    End of Session Equipment Utilized During Treatment: Gait belt Activity Tolerance: Treatment limited secondary to medical complications (Comment) (drop in BP) Patient left: in chair;with call bell/phone within reach;with chair alarm set Nurse  Communication: Mobility status PT Visit Diagnosis: History of falling (Z91.81);Repeated falls (R29.6);Muscle weakness (generalized) (M62.81)     Time: 2683-4196 PT Time Calculation (min) (ACUTE ONLY): 23 min  Charges:  $Therapeutic Activity: 23-37 mins                     Marisa Severin, PT, DPT Acute Rehabilitation Services Secure chat preferred Office [phone]      Marguarite Arbour A Consuella Scurlock 02/09/2022, 10:54 AM

## 2022-02-09 NOTE — Care Management Important Message (Signed)
Important Message  Patient Details  Name: Alan [name] MRN: [id number] Date of Birth: 1941-03-26   Medicare Important Message Given:  Yes     [name] 02/09/2022, 9:30 AM

## 2022-02-09 NOTE — Discharge Summary (Signed)
Physician Discharge Summary  Alan [name] VEH:[id number] DOB: 01-Jun-1941 DOA: 02/01/2022  PCP: [name], MD  Admit date: 02/01/2022 Discharge date: 02/09/2022  Admitted From: Home Disposition:  SNF [facility]  Recommendations for Outpatient Follow-up:  Follow up with PCP in 1-2 weeks Please obtain BMP/CBC in one week daily dressing changes (pack w/ iodoform gauze, cover w/ dry gauze and foam)  Home Health:No Equipment/Devices:None  Discharge Condition:Stable CODE STATUS:Full Diet recommendation: Heart Healthy  Brief/Interim Summary: 81 y.o. male with medical history significant of  intestinal carcinoid, chronic diarrhea, CKD stage 3, anemia, paroxysmal atrial fibrillation, pulmonary embolism, tachy brady syndrome sp pacemaker, orthostatic hypotension, who was recently discharged today after  hospital stay 8/3-8/6  for recurrent syncope above his baseline due to progressive orthostatic hypotension. Patient was treated with ivfs with improvement and per wife only had one episode of syncope while in house which is close to his baseline. He was discharged on midodrine and florinef.  Patient states day of discharge he did not feel well. He describes feeling as feeling heavy. He states he did not mention this because he wanted to go home. However at home he continue to feel unwell and had two syncopal episodes with bp 60 systolic in the field per notes.  Patient currently states he has been complaint with his medications, he notes he did have episode of loose stools and note he did not eat well. He notes no chest pain , sob,  n/v/. He does endorse weight loss.   Discharge Diagnoses:  Active Problems:   Orthostatic hypotension   Paroxysmal atrial fibrillation (HCC)   Syncope   CKD (chronic kidney disease), stage IV (HCC)   Acute on chronic anemia   Protein-calorie malnutrition, severe  Syncope due to orthostatic hypotension: With an ongoing recurring mention patient was recently  discharged on 02/01/2022 come back to the hospital half a day later with similar symptoms.  He was continued on midodrine and Florinef despite this he was having orthostatic hypotension and syncopal episodes including while laying in bed. Cardiology was consulted recommended to continue Florinef and start Northera he was monitored 48 hours with no episodes of syncope or passing out. Patient was frustrated and demanding a cure he was instructed by the previous physician that there is a combination of his carcinoid tumor, tachybradycardia syndrome and atrial fibrillation. He is currently on lanreotide every 4 weeksx 6. Realistic expectation were discussed with the patient.  History of atrial fibrillation: Rate control not on anticoagulation due to history of GI bleed.  Anemia of chronic disease: Hemoglobin has remained close to baseline.  Hypomagnesemia/hypokalemia: Repleted now resolved.  History of PE: Lower extremity Doppler was negative for DVT VQ scan showed intermediate to high probability PE, CT angio of the chest cannot be done due to his chronic kidney disease stage IV. The previous physician spoke with the patient that he clinically does not appear to have a PE especially in the lower extremity DVT has been ruled out he had a very lengthy discussion with the patient and the wife about the risk and benefits of being on anticoagulation versus not being in the light of history of recent GI bleed.  The patient the wife and the previous physician made a mutual decision not to start him on anticoagulation and watch closely for symptoms.  He was made aware that if he develops significant tachycardia chest pain or shortness of breath he needs to seek immediate medical attention.  Chronic kidney disease stage IV: Left  renal mass follow-up with urology as an outpatient his creatinine remained at baseline.  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  Discharge Instructions  Discharge Instructions     Diet - low sodium heart healthy   Complete by: As directed    Diet - low sodium heart healthy   Complete by: As directed    Increase activity slowly   Complete by: As directed    Increase activity slowly   Complete by: As directed    No wound care   Complete by: As directed    No wound care   Complete by: As directed       Allergies as of 02/09/2022       Reactions   Penicillins Hives, Shortness Of Breath   Lisinopril Itching        Medication List     TAKE these medications    albuterol 108 (90 Base) MCG/ACT inhaler Commonly known as: VENTOLIN HFA Inhale 2 puffs into the lungs every 6 (six) hours as needed for wheezing or shortness of breath.   aspirin EC 81 MG tablet Take 81 mg by mouth daily. Swallow whole.   diphenoxylate-atropine 2.5-0.025 MG tablet Commonly known as: LOMOTIL Take 1 tablet by mouth 2 (two) times daily as needed for diarrhea or loose stools.   droxidopa 100 MG Caps Commonly known as: NORTHERA Take 1 capsule (100 mg total) by mouth 3 (three) times daily with meals.   feeding supplement (ENSURE COMPLETE) Liqd Take 237 mLs by mouth 2 (two) times daily between meals.   fludrocortisone 0.1 MG tablet Commonly known as: FLORINEF Take 1 tablet (0.1 mg total) by mouth daily.   Imodium A-D 2 MG capsule Generic drug: loperamide Take 2 mg by mouth daily as needed for diarrhea or loose stools.   iron polysaccharides 150 MG capsule Commonly known as: NIFEREX TAKE 1 CAPSULE(150 MG) BY MOUTH 3 TIMES A WEEK What changed: See the new instructions.   LANREOTIDE ACETATE [place] Inject 1 Dose into the skin every 30 (thirty) days.   midodrine 5 MG tablet Commonly known as: PROAMATINE Take 3 tablets (15 mg total) by mouth 3 (three) times daily with meals.   mirtazapine 30 MG tablet Commonly known as: REMERON Take 1 tablet (30 mg total) by mouth daily.    multivitamin Tabs tablet Take 1 tablet by mouth daily.   Potassium Chloride ER 20 MEQ Tbcr Take 1 tablet by mouth 2 (two) times daily.   PRESERVISION AREDS 2 PO Take 1 capsule by mouth daily.   Symbicort 160-4.5 MCG/ACT inhaler Generic drug: budesonide-formoterol Inhale 2 puffs into the lungs daily as needed (wheezing).   thiamine 50 MG tablet Commonly known as: VITAMIN B-1 Take 1 tablet (50 mg total) by mouth daily.        Contact information for follow-up providers     [name], MD Follow up in 1 week(s).   Specialty: Internal Medicine Contact information: [place] [address] [phone]         Sueanne Margarita, MD .   Specialty: Cardiology Contact information: [phone] [address] [place] [address] [phone]         Constance Haw, MD .   Specialty: Cardiology Contact information: [place] [address] [phone]              Contact information for after-discharge care  Destination     [facility] Preferred SNF .   Service: Skilled Nursing Contact information: [place] [address] [phone]                    Allergies  Allergen Reactions   Penicillins Hives and Shortness Of Breath   Lisinopril Itching    Consultations: Nephrology   Procedures/Studies: VAS [place] LOWER EXTREMITY VENOUS (DVT)  Result Date: 02/02/2022  Lower Venous DVT Study Patient Name:  LANNIS LICHTENWALNER [facility]  Date of Exam:   02/02/2022 Medical Rec #: [id number]        Accession #:    [id number] Date of Birth: 08/25/1940         Patient Gender: M Patient Age:   39 years Exam Location:  [facility] Procedure:      VAS [place] LOWER EXTREMITY VENOUS (DVT) Referring Phys: RAVI PAHWANI --------------------------------------------------------------------------------  Indications: Edema.  Comparison Study: prior 01/19/21 Performing Technologist:  Archie Patten RVS  Examination Guidelines: A complete evaluation includes B-mode imaging, spectral Doppler, color Doppler, and power Doppler as needed of all accessible portions of each vessel. Bilateral testing is considered an integral part of a complete examination. Limited examinations for reoccurring indications may be performed as noted. The reflux portion of the exam is performed with the patient in reverse Trendelenburg.  +---------+---------------+---------+-----------+----------+--------------+ RIGHT    CompressibilityPhasicitySpontaneityPropertiesThrombus Aging +---------+---------------+---------+-----------+----------+--------------+ CFV      Full           Yes      Yes                                 +---------+---------------+---------+-----------+----------+--------------+ SFJ      Full                                                        +---------+---------------+---------+-----------+----------+--------------+ FV Prox  Full                                                        +---------+---------------+---------+-----------+----------+--------------+ FV Mid   Full                                                        +---------+---------------+---------+-----------+----------+--------------+ FV DistalFull                                                        +---------+---------------+---------+-----------+----------+--------------+ PFV      Full                                                        +---------+---------------+---------+-----------+----------+--------------+  POP      Full           Yes      Yes                                 +---------+---------------+---------+-----------+----------+--------------+ PTV      Full                                                        +---------+---------------+---------+-----------+----------+--------------+ PERO     Full                                                         +---------+---------------+---------+-----------+----------+--------------+   +---------+---------------+---------+-----------+----------+--------------+ LEFT     CompressibilityPhasicitySpontaneityPropertiesThrombus Aging +---------+---------------+---------+-----------+----------+--------------+ CFV      Full           Yes      Yes                                 +---------+---------------+---------+-----------+----------+--------------+ SFJ      Full                                                        +---------+---------------+---------+-----------+----------+--------------+ FV Prox  Full                                                        +---------+---------------+---------+-----------+----------+--------------+ FV Mid   Full                                                        +---------+---------------+---------+-----------+----------+--------------+ FV DistalFull                                                        +---------+---------------+---------+-----------+----------+--------------+ PFV      Full                                                        +---------+---------------+---------+-----------+----------+--------------+ POP      Full           Yes      Yes                                 +---------+---------------+---------+-----------+----------+--------------+  PTV      Full                                                        +---------+---------------+---------+-----------+----------+--------------+ PERO     Full                                                        +---------+---------------+---------+-----------+----------+--------------+     Summary: BILATERAL: - No evidence of deep vein thrombosis seen in the lower extremities, bilaterally. -No evidence of popliteal cyst, bilaterally.   *See table(s) above for measurements and observations. Electronically signed by Servando Snare MD on 02/02/2022 at  5:13:58 PM.    Final    NM Pulmonary Perfusion  Result Date: 02/02/2022 CLINICAL DATA:  Syncope, shortness of breath EXAM: NUCLEAR MEDICINE PERFUSION LUNG SCAN TECHNIQUE: Perfusion images were obtained in multiple projections after intravenous injection of radiopharmaceutical. Ventilation scans intentionally deferred if perfusion scan and chest x-ray adequate for interpretation during COVID 19 epidemic. RADIOPHARMACEUTICALS:  Four mCi Tc-39mMAA IV COMPARISON:  Previous studies including the chest radiographs done today and CT chest done on 01/16/2021 FINDINGS: There are multiple subsegmental and segmental foci of decreased perfusion in the mid and lower lung fields on both sides, more so on the left side. There is no infiltrate in the lung fields in the chest radiograph. Evaluation is somewhat limited without ventilation images. IMPRESSION: There are multiple segmental and subsegmental foci of decreased perfusion in both lungs, more so in the left lower lung field. Evaluation is limited without ventilation images. No focal abnormalities are seen in the lung fields in the chest radiograph. Findings suggest intermediate to high probability for pulmonary embolism. If clinically warranted, please consider venous Doppler examination of both lower extremities or CT pulmonary angiogram. Electronically Signed   By: PElmer PickerM.D.   On: 02/02/2022 15:54   DG CHEST PORT 1 VIEW  Result Date: 02/02/2022 CLINICAL DATA:  Short of breath EXAM: PORTABLE CHEST 1 VIEW COMPARISON:  01/29/2022 FINDINGS: No new consolidation or edema. No pleural effusion or pneumothorax. Stable cardiomediastinal contours with normal heart size. Left chest wall dual lead pacemaker. IMPRESSION: No acute process in the chest. Electronically Signed   By: PMacy MisM.D.   On: 02/02/2022 13:00   DG Chest 2 View  Result Date: 01/29/2022 CLINICAL DATA:  Multiple syncopal episodes over the past several weeks. EXAM: CHEST - 2 VIEW  COMPARISON:  Chest x-ray dated October 19, 2021. FINDINGS: Unchanged left chest wall pacemaker. The heart size and mediastinal contours are within normal limits. Normal pulmonary vascularity. No focal consolidation, pleural effusion, or pneumothorax. Multiple skin folds again seen overlying the chest. No acute osseous abnormality. IMPRESSION: No active cardiopulmonary disease. Electronically Signed   By: WTitus DubinM.D.   On: 01/29/2022 17:32   EEG adult  Result Date: 01/28/2022 ACameron Sprang MD     01/28/2022 11:08 AM ELECTROENCEPHALOGRAM REPORT Date of Study: 01/28/2022 Patient's Name: LHolt WoolbrightMcDougle MRN: [id number] of Birth: 3Dec 30, 1942Referring Provider: Dr. KEllouise NewerClinical History: This is an 81year old man with recurrent dizziness, staring episodes, and syncope. EEG for classification. Medications:  Aspirin, Questran, Lomotil, Marinol, Florinef, Proamantine, Remeron, Thiamine Technical Summary: A multichannel digital EEG recording measured by the international 10-20 system with electrodes applied with paste and impedances below 5000 ohms performed in our laboratory with EKG monitoring in an awake and asleep patient.  Hyperventilation was not performed. Photic stimulation was performed.  The digital EEG was referentially recorded, reformatted, and digitally filtered in a variety of bipolar and referential montages for optimal display.  Description: The patient is awake and asleep during the recording.  During maximal wakefulness, there is a symmetric, medium voltage 8 Hz posterior dominant rhythm that attenuates with eye opening.  The record is symmetric.  During drowsiness and sleep, there is an increase in theta slowing of the background with vertex waves seen. Photic stimulation did not elicit any abnormalities.  There were no epileptiform discharges or electrographic seizures seen.  EKG lead was unremarkable. Impression: This awake and asleep EEG is normal.  Clinical Correlation: A normal EEG  does not exclude a clinical diagnosis of epilepsy.  If further clinical questions remain, prolonged EEG may be helpful.  Clinical correlation is advised. Ellouise Newer, M.D.   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 EXAM: MRI ABDOMEN WITHOUT CONTRAST TECHNIQUE: Multiplanar multisequence MR imaging was performed without the administration of intravenous contrast. COMPARISON:  11/18/2021 FINDINGS: Lower chest: No acute findings. Hepatobiliary: Intrinsically T2 hyperintense mass of the central liver dome, hepatic segment VII/VIII measuring 4.2 x 4.1 cm, not significantly changed compared to prior PET-CT accounting for cross modality comparison when measured with similar technique (series 5, image 4). Similar lesion adjacent to the falciform ligament, hepatic segment III/IV B, measuring 1.4 x 1.3 cm, previously noted on PET examination but not clearly visualized by noncontrast CT (series 5, image 15). Benign, lobulated fluid signal cyst of the inferior right lobe of the liver adjacent to the gallbladder fossa (series 5, image 15). No gallstones. No biliary ductal dilatation. Pancreas: No mass, inflammatory changes, or other parenchymal abnormality identified.No pancreatic ductal dilatation. Spleen:  Within normal limits in size and appearance. Adrenals/Urinary Tract: Normal adrenal glands. Intrinsically T2 hypointense, T1 heterogeneous exophytic solid lesion of the posterior midportion of the left kidney measuring 2.6 x 2.2 cm (series 5, image 22). Multiseptated cystic lesion of the posterior midportion of the right kidney measuring 1.9 x 1.2 cm (series 5, image 12, series 4, image 6). Multiple additional simple appearing, fluid signal benign renal cortical cysts bilaterally. Nonobstructive calculus of the inferior pole of the right kidney. No evidence of hydronephrosis. Stomach/Bowel: Visualized portions within the abdomen  are unremarkable. Vascular/Lymphatic: No pathologically enlarged lymph nodes identified. No abdominal aortic aneurysm demonstrated. Other:  Trace perihepatic ascites. Musculoskeletal: No suspicious osseous lesions identified. IMPRESSION: 1. Solid lesion of the posterior midportion of the left kidney measuring 2.6 x 2.2 cm, in keeping with ultrasound findings and suspicious for a renal cell carcinoma. Please note that noncontrast MR is significantly limited for the characterization of abdominal organ lesions, and generally should be evaluated. Stable chronic renal disease is generally not a contraindication to gadolinium contrast administration. 2. Multiseptated cystic lesion of the posterior midportion of the right kidney measuring 1.9 x 1.2 cm, consistent with a complex cyst although incompletely characterized for contrast enhancement, as above. 3. Multiple additional simple appearing benign appearing renal cortical cysts bilaterally, for which no further follow-up or characterization is required. 4. Masses of the liver dome and left lobe of the liver, not  significantly changed compared to prior PET-CT, consistent with known metastatic carcinoid. 5. 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 6. Trace perihepatic ascites. Electronically Signed   By: Delanna Ahmadi M.D.   On: 01/26/2022 15:30   MR ANGIO HEAD WO CONTRAST  Result Date: 01/26/2022 CLINICAL DATA:  Episodes of staring. Dizziness. Syncope, unspecified syncope type. EXAM: MRI HEAD WITHOUT CONTRAST MRA HEAD WITHOUT CONTRAST TECHNIQUE: Multiplanar, multi-echo pulse sequences of the brain and surrounding structures were acquired without intravenous contrast. Angiographic images of the Circle of Willis were acquired using MRA technique without intravenous contrast. COMPARISON:  Head CT 04/22/2021. FINDINGS: MRI HEAD FINDINGS Brain: Mild for age generalized parenchymal atrophy.  Multifocal T2 FLAIR hyperintense signal abnormality within the cerebral white matter and pons, nonspecific but compatible with mild chronic small vessel ischemic disease. Prominent perivascular spaces in the bilateral deep gray nuclei. 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 There is no acute infarct. No evidence of an intracranial mass. No chronic intracranial blood products. No extra-axial fluid collection. No midline shift. Vascular: Maintained flow voids within the proximal large arterial vessels. Skull and upper cervical spine: No focal suspicious marrow lesion. Incompletely assessed cervical spondylosis. Sinuses/Orbits: No mass or acute finding within the imaged orbits. Prior bilateral ocular lens replacement. Minimal mucosal thickening within the bilateral ethmoid and right maxillary sinuses. MRA HEAD FINDINGS Anterior circulation: The intracranial internal carotid arteries are patent. The M1 middle cerebral arteries are patent. No M2 proximal branch occlusion or high-grade proximal stenosis. The anterior cerebral arteries are patent. The right A1 segment is developmentally diminutive or absent. 2-3 mm inferiorly projecting vascular protrusion arising from the supraclinoid left ICA, which may reflect an aneurysm or infundibulum (for instance as seen on series 1038, image 4). 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 Posterior circulation: The intracranial vertebral arteries are patent. The basilar artery is patent. The posterior cerebral arteries are patent. A right posterior communicating artery is present. The left posterior communicating artery is diminutive or absent. Anatomic variants: As described. IMPRESSION: MRI brain: 1. No evidence of acute intracranial abnormality. 2. Mild chronic small vessel ischemic changes within the cerebral white matter and pons (including a chronic  pontine lacunar infarct). 3. Mild generalized parenchymal atrophy. MRA head: 1. No intracranial large vessel occlusion or proximal high-grade arterial stenosis. 2. 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 3. 2-3 mm inferiorly projecting vascular protrusion arising from the supraclinoid left ICA, which may reflect an aneurysm or infundibulum. Electronically Signed   By: Kellie Simmering D.O.   On: 01/26/2022 14:45   MR BRAIN WO CONTRAST  Result Date: 01/26/2022 CLINICAL DATA:  Episodes of staring. Dizziness. Syncope, unspecified syncope type. EXAM: MRI HEAD WITHOUT CONTRAST MRA HEAD WITHOUT CONTRAST TECHNIQUE: Multiplanar, multi-echo pulse sequences of the brain and surrounding structures were acquired without intravenous contrast. Angiographic images of the Circle of Willis were acquired using MRA technique without intravenous contrast. COMPARISON:  Head CT 04/22/2021. FINDINGS: MRI HEAD FINDINGS Brain: Mild for age generalized parenchymal atrophy. Multifocal T2 FLAIR hyperintense signal abnormality within the cerebral white matter and pons, nonspecific but compatible with mild chronic small vessel ischemic disease. Prominent perivascular spaces in the bilateral deep gray nuclei. 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 There is no acute infarct. No evidence  of an intracranial mass. No chronic intracranial blood products. No extra-axial fluid collection. No midline shift. Vascular: Maintained flow voids within the proximal large arterial vessels. Skull and upper cervical spine: No focal suspicious marrow lesion. Incompletely assessed cervical spondylosis. Sinuses/Orbits: No mass or acute finding within the imaged orbits. Prior bilateral ocular lens replacement. Minimal mucosal thickening within the bilateral ethmoid and right maxillary sinuses. MRA HEAD FINDINGS Anterior circulation: The intracranial internal carotid  arteries are patent. The M1 middle cerebral arteries are patent. No M2 proximal branch occlusion or high-grade proximal stenosis. The anterior cerebral arteries are patent. The right A1 segment is developmentally diminutive or absent. 2-3 mm inferiorly projecting vascular protrusion arising from the supraclinoid left ICA, which may reflect an aneurysm or infundibulum (for instance as seen on series 1038, image 4). 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 Posterior circulation: The intracranial vertebral arteries are patent. The basilar artery is patent. The posterior cerebral arteries are patent. A right posterior communicating artery is present. The left posterior communicating artery is diminutive or absent. Anatomic variants: As described. IMPRESSION: MRI brain: 1. No evidence of acute intracranial abnormality. 2. Mild chronic small vessel ischemic changes within the cerebral white matter and pons (including a chronic pontine lacunar infarct). 3. Mild generalized parenchymal atrophy. MRA head: 1. No intracranial large vessel occlusion or proximal high-grade arterial stenosis. 2. 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 3. 2-3 mm inferiorly projecting vascular protrusion arising from the supraclinoid left ICA, which may reflect an aneurysm or infundibulum. Electronically Signed   By: Kellie Simmering D.O.   On: 01/26/2022 14:45   CUP PACEART REMOTE DEVICE CHECK  Result Date: 01/22/2022 Scheduled remote reviewed. Normal device function.  Next remote 91 days. Kathy Breach, RN, CCDS, [employer]  VAS US CAROTID  Result Date: 01/20/2022 Carotid Arterial Duplex Study Patient Name:  STRUMMER CANIPE [facility]  Date of Exam:   01/20/2022 Medical Rec #: [id number]        Accession #:    [id number] Date of Birth: 1941-03-07         Patient  Gender: M Patient Age:   83 years Exam Location:  [facility] Procedure:      VAS US CAROTID Referring Phys: Ellouise Newer --------------------------------------------------------------------------------  Indications:       Dizziness. Risk Factors:      Hypertension. Comparison Study:  No prior study Performing Technologist: Maudry Mayhew MHA, RDMS, RVT, RDCS  Examination Guidelines: A complete evaluation includes B-mode imaging, spectral Doppler, color Doppler, and power Doppler as needed of all accessible portions of each vessel. Bilateral testing is considered an integral part of a complete examination. Limited examinations for reoccurring indications may be performed as noted.  Right Carotid Findings: +----------+--------+-------+--------+--------------------------------+--------+           PSV cm/sEDV    StenosisPlaque Description              Comments                   cm/s                                                    +----------+--------+-------+--------+--------------------------------+--------+ CCA Prox  52  14                                                      +----------+--------+-------+--------+--------------------------------+--------+ CCA Distal39      9              focal, smooth and calcific               +----------+--------+-------+--------+--------------------------------+--------+ ICA Prox  35      10             smooth, heterogenous and                                                  calcific                                 +----------+--------+-------+--------+--------------------------------+--------+ ICA Distal55      20                                                      +----------+--------+-------+--------+--------------------------------+--------+ ECA       43      8                                                       +----------+--------+-------+--------+--------------------------------+--------+  +----------+--------+-------+----------------+-------------------+           PSV cm/sEDV cmsDescribe        Arm Pressure (mmHG) +----------+--------+-------+----------------+-------------------+ [id number]             Multiphasic, WNL                    +----------+--------+-------+----------------+-------------------+ +---------+--------+--+--------+--+---------+ VertebralPSV cm/s52EDV cm/s15Antegrade +---------+--------+--+--------+--+---------+  Left Carotid Findings: +----------+--------+-------+--------+--------------------------------+--------+           PSV cm/sEDV    StenosisPlaque Description              Comments                   cm/s                                                    +----------+--------+-------+--------+--------------------------------+--------+ CCA Prox  47      17             smooth and heterogenous                  +----------+--------+-------+--------+--------------------------------+--------+ CCA Distal42      14             smooth and heterogenous                  +----------+--------+-------+--------+--------------------------------+--------+ ICA Prox  29  15             smooth and heterogenous                  +----------+--------+-------+--------+--------------------------------+--------+ ICA Distal87      36                                                      +----------+--------+-------+--------+--------------------------------+--------+ ECA       34      5              smooth, heterogenous and                                                  calcific                                 +----------+--------+-------+--------+--------------------------------+--------+ +----------+--------+--------+----------------+-------------------+           PSV cm/sEDV cm/sDescribe        Arm Pressure (mmHG) +----------+--------+--------+----------------+-------------------+ [id number]               Multiphasic, WNL                    +----------+--------+--------+----------------+-------------------+ +---------+--------+--+--------+--+---------+ VertebralPSV cm/s56EDV cm/s18Antegrade +---------+--------+--+--------+--+---------+   Summary: Right Carotid: Velocities in the right ICA are consistent with a 1-39% stenosis. Left Carotid: Velocities in the left ICA are consistent with a 1-39% stenosis. Vertebrals:  Bilateral vertebral arteries demonstrate antegrade flow. Subclavians: Normal flow hemodynamics were seen in bilateral subclavian              arteries. *See table(s) above for measurements and observations.  Electronically signed by Deitra Mayo MD on 01/20/2022 at 2:16:21 PM.    Final    (Echo, Carotid, EGD, Colonoscopy, ERCP)    Subjective: No further episodes.  Discharge Exam: Vitals:   02/09/22 0342 02/09/22 0431  BP: (!) 190/90 (!) 148/80  Pulse: 81 72  Resp: 17 17  Temp: 97.8 F (36.6 C)   SpO2: 100%    Vitals:   02/09/22 0333 02/09/22 0342 02/09/22 0356 02/09/22 0431  BP: (!) 200/100 (!) 190/90  (!) 148/80  Pulse: 79 81  72  Resp: '18 17  17  '$ Temp:  97.8 F (36.6 C)    TempSrc:  Oral    SpO2:  100%    Weight:   59 kg   Height:        General: Pt is alert, awake, not in acute distress Cardiovascular: RRR, S1/S2 +, no rubs, no gallops Respiratory: CTA bilaterally, no wheezing, no rhonchi Abdominal: Soft, NT, ND, bowel sounds + Extremities: no edema, no cyanosis    The results of significant diagnostics from this hospitalization (including imaging, microbiology, ancillary and laboratory) are listed below for reference.     Microbiology: No results found for this or any previous visit (from the past 240 hour(s)).    Labs: BNP (last 3 results) No results for input(s): "BNP" in the last 8760 hours. Basic Metabolic Panel: Recent Labs  Lab 02/02/22 1220 02/03/22 0851 02/07/22 0946  NA 142 142 141  K 3.7 4.0 5.1  CL  112* 111 112*  CO2  22 22 20*  GLUCOSE 130* 130* 143*  BUN 39* 51* 58*  CREATININE 2.65* 2.71* 3.15*  CALCIUM 8.9 8.9 9.4  MG 2.0 1.8  --     Liver Function Tests: No results for input(s): "AST", "ALT", "ALKPHOS", "BILITOT", "PROT", "ALBUMIN" in the last 168 hours.  No results for input(s): "LIPASE", "AMYLASE" in the last 168 hours.  No results for input(s): "AMMONIA" in the last 168 hours. CBC: Recent Labs  Lab 02/02/22 1220  WBC 9.3  HGB 8.9*  HCT 26.0*  MCV 88.4  PLT 145*    Cardiac Enzymes: No results for input(s): "CKTOTAL", "CKMB", "CKMBINDEX", "TROPONINI" in the last 168 hours. BNP: Invalid input(s): "POCBNP" CBG: Recent Labs  Lab 02/05/22 1233 02/05/22 1618  GLUCAP 164* 152*    D-Dimer No results for input(s): "DDIMER" in the last 72 hours.  Hgb A1c No results for input(s): "HGBA1C" in the last 72 hours. Lipid Profile No results for input(s): "CHOL", "HDL", "LDLCALC", "TRIG", "CHOLHDL", "LDLDIRECT" in the last 72 hours. Thyroid function studies No results for input(s): "TSH", "T4TOTAL", "T3FREE", "THYROIDAB" in the last 72 hours.  Invalid input(s): "FREET3" Anemia work up No results for input(s): "VITAMINB12", "FOLATE", "FERRITIN", "TIBC", "IRON", "RETICCTPCT" in the last 72 hours.  Urinalysis    Component Value Date/Time   COLORURINE YELLOW 02/01/2022 1912   APPEARANCEUR HAZY (A) 02/01/2022 1912   LABSPEC 1.011 02/01/2022 1912   PHURINE 5.0 02/01/2022 1912   GLUCOSEU NEGATIVE 02/01/2022 1912   HGBUR NEGATIVE 02/01/2022 1912   BILIRUBINUR NEGATIVE 02/01/2022 1912   KETONESUR NEGATIVE 02/01/2022 1912   PROTEINUR 100 (A) 02/01/2022 1912   UROBILINOGEN 0.2 08/23/2012 2225   NITRITE NEGATIVE 02/01/2022 1912   LEUKOCYTESUR NEGATIVE 02/01/2022 1912   Sepsis Labs Recent Labs  Lab 02/02/22 1220  WBC 9.3    Microbiology No results found for this or any previous visit (from the past 240 hour(s)).    Time coordinating discharge: Over 30  minutes  SIGNED:   Charlynne Cousins, MD  [facility] 02/09/2022, 7:27 AM Pager   If 7PM-7AM, please contact night-coverage [email or web address] Password [email or web address]

## 2022-02-10 ENCOUNTER — Other Ambulatory Visit: Payer: Medicare Other | Admitting: Internal Medicine

## 2022-02-10 ENCOUNTER — Ambulatory Visit: Payer: Medicare Other | Admitting: Internal Medicine

## 2022-02-10 ENCOUNTER — Ambulatory Visit: Payer: Medicare Other | Admitting: Psychologist

## 2022-02-10 DIAGNOSIS — Z950 Presence of cardiac pacemaker: Secondary | ICD-10-CM | POA: Diagnosis not present

## 2022-02-10 DIAGNOSIS — C7A00 Malignant carcinoid tumor of unspecified site: Secondary | ICD-10-CM | POA: Diagnosis not present

## 2022-02-10 DIAGNOSIS — I1310 Hypertensive heart and chronic kidney disease without heart failure, with stage 1 through stage 4 chronic kidney disease, or unspecified chronic kidney disease: Secondary | ICD-10-CM | POA: Diagnosis not present

## 2022-02-11 ENCOUNTER — Ambulatory Visit: Payer: Medicare Other | Admitting: Cardiology

## 2022-02-11 ENCOUNTER — Other Ambulatory Visit: Payer: Self-pay | Admitting: *Deleted

## 2022-02-11 NOTE — Patient Outreach (Signed)
Alan [name] currently resides in [facility]. Screening for [facility] care coordination/care management services as a benefit for insurance plan and PCP. Alan [name] admitted to SNF on 02/09/22.  Collaboration with [facility] SN[facility] SW to make aware writer is following for progression, transition plans, and [facility] needs.   Will continue to follow.   Marthenia Rolling, MSN, RN,BSN [place] Acute Care Coordinator [phone] [facility]) [phone]  (Toll free office)

## 2022-02-12 ENCOUNTER — Other Ambulatory Visit: Payer: Medicare Other | Admitting: Internal Medicine

## 2022-02-12 DIAGNOSIS — Z9181 History of falling: Secondary | ICD-10-CM | POA: Diagnosis not present

## 2022-02-12 DIAGNOSIS — I129 Hypertensive chronic kidney disease with stage 1 through stage 4 chronic kidney disease, or unspecified chronic kidney disease: Secondary | ICD-10-CM | POA: Diagnosis not present

## 2022-02-12 DIAGNOSIS — N1832 Chronic kidney disease, stage 3b: Secondary | ICD-10-CM | POA: Diagnosis not present

## 2022-02-12 DIAGNOSIS — Z950 Presence of cardiac pacemaker: Secondary | ICD-10-CM | POA: Diagnosis not present

## 2022-02-12 DIAGNOSIS — I951 Orthostatic hypotension: Secondary | ICD-10-CM | POA: Diagnosis not present

## 2022-02-12 DIAGNOSIS — K529 Noninfective gastroenteritis and colitis, unspecified: Secondary | ICD-10-CM | POA: Diagnosis not present

## 2022-02-12 DIAGNOSIS — R55 Syncope and collapse: Secondary | ICD-10-CM | POA: Diagnosis not present

## 2022-02-12 NOTE — Progress Notes (Signed)
Remote pacemaker transmission.   

## 2022-02-13 DIAGNOSIS — I4891 Unspecified atrial fibrillation: Secondary | ICD-10-CM | POA: Diagnosis not present

## 2022-02-13 DIAGNOSIS — I951 Orthostatic hypotension: Secondary | ICD-10-CM | POA: Diagnosis not present

## 2022-02-13 DIAGNOSIS — J449 Chronic obstructive pulmonary disease, unspecified: Secondary | ICD-10-CM | POA: Diagnosis not present

## 2022-02-16 DIAGNOSIS — I951 Orthostatic hypotension: Secondary | ICD-10-CM | POA: Diagnosis not present

## 2022-02-16 DIAGNOSIS — N1832 Chronic kidney disease, stage 3b: Secondary | ICD-10-CM | POA: Diagnosis not present

## 2022-02-16 DIAGNOSIS — Z9181 History of falling: Secondary | ICD-10-CM | POA: Diagnosis not present

## 2022-02-16 DIAGNOSIS — K529 Noninfective gastroenteritis and colitis, unspecified: Secondary | ICD-10-CM | POA: Diagnosis not present

## 2022-02-16 DIAGNOSIS — I129 Hypertensive chronic kidney disease with stage 1 through stage 4 chronic kidney disease, or unspecified chronic kidney disease: Secondary | ICD-10-CM | POA: Diagnosis not present

## 2022-02-16 DIAGNOSIS — R55 Syncope and collapse: Secondary | ICD-10-CM | POA: Diagnosis not present

## 2022-02-16 DIAGNOSIS — I10 Essential (primary) hypertension: Secondary | ICD-10-CM | POA: Diagnosis not present

## 2022-02-16 DIAGNOSIS — Z950 Presence of cardiac pacemaker: Secondary | ICD-10-CM | POA: Diagnosis not present

## 2022-02-18 ENCOUNTER — Other Ambulatory Visit: Payer: Self-pay | Admitting: *Deleted

## 2022-02-18 DIAGNOSIS — C7A00 Malignant carcinoid tumor of unspecified site: Secondary | ICD-10-CM | POA: Diagnosis not present

## 2022-02-18 NOTE — Patient Outreach (Addendum)
[facility] Post- Acute Care Coordinator follow up. Mr. Alan [name] resides in [facility]. Screening for [facility] care coordination/care management services as a benefit of insurance plan and PCP.   Facility site visit to [facility] earlier today. Met with Mr. Alan [name] at bedside. States his "biggest stressor is being in hospital and in skilled nursing facility." States he is trying to stay hydrated. States his radiation treatment has not started yet due to his kidney function. States he has "some light headedness episodes." Reports he has had some medication changes as of late. Confirms he lives with spouse and plan is to return home.   Discussed [facility] care coordination follow up. Discussed he was active with [facility] care coordination team in the recent past. Agreeable to re-referral. However, asks writer call his wife to discuss further. Provided [facility] Care Management brochure, 24-hr nurse advice line magnet, and writer's contact information.   Telephone call made to Mrs. Alan [name] after writer's visit to [facility]. Patient identifiers confirmed. Conversation brief as she states she is now visiting Mr. Alan [name] in SNF. Discussed how writer just missed her. Explained Probation officer is following again for [facility] care coordination services. Mrs. Alan [name] agreeable. Will plan to make referral to [facility] care coordination team upon SNF discharge.    Marthenia Rolling, MSN, RN,BSN [place] Acute Care Coordinator [phone] [facility]) [phone]  (Toll free office)

## 2022-02-19 DIAGNOSIS — K529 Noninfective gastroenteritis and colitis, unspecified: Secondary | ICD-10-CM | POA: Diagnosis not present

## 2022-02-19 DIAGNOSIS — Z950 Presence of cardiac pacemaker: Secondary | ICD-10-CM | POA: Diagnosis not present

## 2022-02-19 DIAGNOSIS — Z9181 History of falling: Secondary | ICD-10-CM | POA: Diagnosis not present

## 2022-02-19 DIAGNOSIS — N1832 Chronic kidney disease, stage 3b: Secondary | ICD-10-CM | POA: Diagnosis not present

## 2022-02-19 DIAGNOSIS — I951 Orthostatic hypotension: Secondary | ICD-10-CM | POA: Diagnosis not present

## 2022-02-19 DIAGNOSIS — R55 Syncope and collapse: Secondary | ICD-10-CM | POA: Diagnosis not present

## 2022-02-19 DIAGNOSIS — I129 Hypertensive chronic kidney disease with stage 1 through stage 4 chronic kidney disease, or unspecified chronic kidney disease: Secondary | ICD-10-CM | POA: Diagnosis not present

## 2022-02-20 ENCOUNTER — Encounter: Payer: Self-pay | Admitting: Hematology and Oncology

## 2022-02-20 ENCOUNTER — Encounter: Payer: Medicare Other | Admitting: Internal Medicine

## 2022-02-20 ENCOUNTER — Other Ambulatory Visit: Payer: Self-pay

## 2022-02-20 DIAGNOSIS — N1832 Chronic kidney disease, stage 3b: Secondary | ICD-10-CM | POA: Diagnosis not present

## 2022-02-20 DIAGNOSIS — H3530 Unspecified macular degeneration: Secondary | ICD-10-CM | POA: Diagnosis not present

## 2022-02-20 DIAGNOSIS — I951 Orthostatic hypotension: Secondary | ICD-10-CM | POA: Diagnosis not present

## 2022-02-20 DIAGNOSIS — C7A019 Malignant carcinoid tumor of the small intestine, unspecified portion: Secondary | ICD-10-CM

## 2022-02-20 DIAGNOSIS — R1310 Dysphagia, unspecified: Secondary | ICD-10-CM | POA: Diagnosis not present

## 2022-02-20 NOTE — Progress Notes (Deleted)
[place] Visit Telephone: [phone]  Fax: [phone]   Date of encounter: 02/20/22 12:24 PM PATIENT NAME: Alan [name] [place] [id number]   [phone] (home)  DOB: [birth year] MRN: [id number] PRIMARY CARE PROVIDER:    [name], MD,  [name] [address] [phone]  REFERRING PROVIDER:   [name], MD [address] [place],  [place] [address] [phone]  RESPONSIBLE PARTY:    Contact Information     Name Relation Home Work Mobile   Yamamoto,[name] Spouse [phone]  [phone]   [place] Pandora Leiter [phone]  [phone]        I met face to face with patient and family in *** home/facility. Palliative Care was asked to follow this patient by consultation request of  [name], MD to address advance care planning and complex medical decision making. This is follow-up visit.                                     ASSESSMENT AND PLAN / RECOMMENDATIONS:   Advance Care Planning/Goals of Care: Goals include to maximize quality of life and symptom management. Patient/health care surrogate gave his/her permission to discuss.Our advance care planning conversation included a discussion about:    The value and importance of advance care planning  Experiences with loved ones who have been seriously ill or have died  Exploration of personal, cultural or spiritual beliefs that might influence medical decisions  Exploration of goals of care in the event of a sudden injury or illness  Identification  of a healthcare agent  Review and updating or creation of an  advance directive document . Decision not to resuscitate or to de-escalate disease focused treatments due to poor prognosis. CODE STATUS:  Symptom Management/Plan:    Follow up Palliative Care Visit: Palliative care will continue to follow for complex medical decision making, advance  care planning, and clarification of goals. Return *** weeks or prn.  I spent *** minutes providing this consultation. More than 50% of the time in this consultation was spent in counseling and care coordination.  This visit was coded based on medical decision making (MDM).***  PPS: ***0%  HOSPICE ELIGIBILITY/DIAGNOSIS: TBD  Chief Complaint: Follow-up palliative visit  HISTORY OF PRESENT ILLNESS:  Alan [name] is a 81 y.o. year old male  with *** .   History obtained from review of EMR, discussion with primary team, and interview with family, facility staff/caregiver and/or Alan [name].  I reviewed available labs, medications, imaging, studies and related documents from the EMR.  Records reviewed and summarized above.   ROS Review of Systems  Physical Exam: There were no vitals filed for this visit. There is no height or weight on file to calculate BMI. Wt Readings from Last 500 Encounters:  02/09/22 130 lb 1.1 oz (59 kg)  01/29/22 133 lb 6 oz (60.5 kg)  01/28/22 133 lb 6 oz (60.5 kg)  01/09/22 136 lb 3.2 oz (61.8 kg)  12/04/21 136 lb (61.7 kg)  11/25/21 141 lb 1.6 oz (64 kg)  11/10/21 144 lb (65.3 kg)  11/04/21 142 lb (64.4 kg)  10/24/21 124 lb 1.9 oz (56.3 kg)  10/13/21 136 lb 8 oz (61.9 kg)  09/01/21 139 lb (63 kg)  08/15/21 138 lb 12.8 oz (63 kg)  07/28/21 139 lb 3.2 oz (63.1 kg)  06/26/21 141 lb (64 kg)  06/11/21 139 lb 3.2 oz (63.1 kg)  06/09/21 142 lb (64.4 kg)  04/23/21 141 lb 4.8 oz (64.1 kg)  04/21/21 143 lb 12.8 oz (65.2 kg)  02/25/21 148 lb 6.4 oz (67.3 kg)  02/20/21 147 lb 9.6 oz (67 kg)  02/19/21 145 lb 9.6 oz (66 kg)  01/22/21 156 lb 12 oz (71.1 kg)  01/17/21 149 lb 3.2 oz (67.7 kg)  01/21/11 173 lb (78.5 kg)   Physical Exam  CURRENT PROBLEM LIST:  Patient Active Problem List   Diagnosis Date Noted   Protein-calorie malnutrition, severe 02/05/2022   CKD (chronic kidney disease), stage IV ([place]) 02/02/2022   Acute on chronic anemia 02/02/2022    Syncope 02/01/2022   Paroxysmal atrial fibrillation ([place]) 01/31/2022   History of pulmonary embolism 01/31/2022   Hypotension 01/31/2022   Acute kidney injury superimposed on chronic kidney disease ([place]) 01/28/2022   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   Back abscess 01/28/2022   Anemia due to acquired thiamine deficiency 12/10/2021   Deficiency anemia 12/04/2021   Oropharyngeal dysphagia 06/27/2021   Short gut syndrome    Major depressive disorder 04/22/2021   Sinus node dysfunction ([place]) 04/22/2021   Atrial fibrillation with rapid ventricular response ([place]) 04/22/2021   AV block, 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   Orthostatic hypotension 02/20/2021   Iron deficiency anemia due to chronic blood loss 02/20/2021   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   GIB (gastrointestinal bleeding) 01/18/2021   Essential hypertension 01/18/2021    PAST MEDICAL HISTORY:  Active Ambulatory Problems    Diagnosis Date Noted   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   GIB (gastrointestinal bleeding) 01/18/2021   Essential hypertension 01/18/2021   Orthostatic hypotension 02/20/2021   Iron deficiency anemia due to chronic blood loss 02/20/2021   AV block, 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   Major depressive disorder 04/22/2021   Sinus node dysfunction ([place]) 04/22/2021   Atrial fibrillation with rapid ventricular response ([place]) 04/22/2021   Short gut syndrome    Oropharyngeal dysphagia 06/27/2021   Deficiency anemia 12/04/2021   Anemia due to acquired thiamine deficiency 12/10/2021   Acute kidney injury superimposed on chronic kidney disease ([place]) 01/28/2022   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   Back abscess 01/28/2022   Paroxysmal atrial fibrillation ([place]) 01/31/2022   History of pulmonary embolism 01/31/2022   Hypotension 01/31/2022   Syncope 02/01/2022   CKD (chronic kidney disease), stage IV ([place]) 02/02/2022   Acute on chronic anemia 02/02/2022    Protein-calorie malnutrition, severe 02/05/2022   Resolved Ambulatory Problems    Diagnosis Date Noted   Acute pulmonary embolism ([place]) 01/16/2021   Near syncope 01/18/2021   Bradycardia 01/18/2021   Hypokalemia due to excessive gastrointestinal loss of potassium 01/18/2021   Chronic kidney disease, stage 3a ([place]) 01/18/2021   Deficiency anemia 02/19/2021   Weight loss, abnormal 02/19/2021   Productive cough 02/19/2021   Acute bronchitis due to 2019 novel coronavirus 02/20/2021   Syncope 04/22/2021   Elevated troponin level not due myocardial infarction 04/22/2021   Acute renal failure superimposed on stage 3b chronic kidney disease ([place]) 10/19/2021   Skin tear of left forearm without complication 31/49/7026   Past Medical History:  Diagnosis Date   Asthma    Carcinoid tumor, small intestine, malignant (HCC)    Chronic diarrhea    CKD (chronic kidney disease) stage 3, GFR 30-59 ml/min (HCC)    HTN (hypertension)     SOCIAL HX:  Social History   Tobacco Use   Smoking status: Some Days    Packs/day: 0.30    Years: 61.00    Total pack years: 18.30    Types: Cigars, Cigarettes    Start date: 30    Last attempt to quit: 03/03/2020    Years since quitting: 1.9   Smokeless tobacco: Never   Tobacco comments:    Former cig   Substance Use Topics   Alcohol use: Not Currently     ALLERGIES:  Allergies  Allergen Reactions   Penicillins Hives and Shortness Of Breath   Lisinopril Itching      PERTINENT MEDICATIONS:  Outpatient Encounter Medications as of 02/20/2022  Medication Sig   albuterol (VENTOLIN HFA) 108 (90 Base) MCG/ACT inhaler Inhale 2 puffs into the lungs every 6 (six) hours as needed for wheezing or shortness of breath.   aspirin 81 MG EC tablet Take 81 mg by mouth daily. Swallow whole.   diphenoxylate-atropine (LOMOTIL) 2.5-0.025 MG tablet Take 1 tablet by mouth 2 (two) times daily as needed for diarrhea or loose stools.   droxidopa (NORTHERA) 100 MG CAPS  Take 1 capsule (100 mg total) by mouth 3 (three) times daily with meals.   feeding supplement, ENSURE COMPLETE, (ENSURE COMPLETE) LIQD Take 237 mLs by mouth 2 (two) times daily between meals.   fludrocortisone (FLORINEF) 0.1 MG tablet Take 1 tablet (0.1 mg total) by mouth daily.   iron polysaccharides (NIFEREX) 150 MG capsule TAKE 1 CAPSULE(150 MG) BY MOUTH 3 TIMES A WEEK (Patient taking differently: Take 150 mg by mouth 3 (three) times a week.)   LANREOTIDE ACETATE [place] Inject 1 Dose into the skin every 30 (thirty) days.   loperamide (IMODIUM A-D) 2 MG capsule Take 2 mg by mouth daily as needed for diarrhea or loose stools.   midodrine (PROAMATINE) 5 MG tablet Take 3 tablets (15 mg total) by mouth 3 (three) times daily with meals.   mirtazapine (REMERON) 30 MG tablet Take 1 tablet (30 mg total) by mouth daily.   Multiple Vitamins-Minerals (PRESERVISION AREDS 2 PO) Take 1 capsule by mouth daily.   multivitamin (ONE-A-DAY MEN'S) TABS tablet Take 1 tablet by mouth daily.   Potassium Chloride ER 20 MEQ TBCR Take 1 tablet by mouth 2 (two) times daily.   SYMBICORT 160-4.5 MCG/ACT inhaler Inhale 2 puffs into the lungs daily as needed (wheezing).   thiamine (VITAMIN B-1) 50 MG tablet Take 1 tablet (50 mg total) by mouth daily.   No facility-administered encounter medications on file as of 02/20/2022.    Thank you for the opportunity to participate in the care of Alan [name].  The palliative care team will continue to follow. Please call our office at [phone] if we can be of additional assistance.   Hollace Kinnier, DO  COVID-19 PATIENT SCREENING TOOL Asked and negative response unless otherwise noted:  Have you had symptoms of covid, tested positive or been in contact with someone with symptoms/positive test in the past 5-10 days? No

## 2022-02-23 ENCOUNTER — Inpatient Hospital Stay (HOSPITAL_BASED_OUTPATIENT_CLINIC_OR_DEPARTMENT_OTHER): Payer: Medicare Other | Admitting: Hematology

## 2022-02-23 ENCOUNTER — Other Ambulatory Visit: Payer: Self-pay

## 2022-02-23 ENCOUNTER — Encounter: Payer: Self-pay | Admitting: Internal Medicine

## 2022-02-23 ENCOUNTER — Telehealth: Payer: Self-pay

## 2022-02-23 ENCOUNTER — Inpatient Hospital Stay: Payer: Medicare Other

## 2022-02-23 ENCOUNTER — Other Ambulatory Visit: Payer: Self-pay | Admitting: Hematology

## 2022-02-23 VITALS — BP 112/72 | HR 67 | Temp 98.1°F | Resp 14

## 2022-02-23 DIAGNOSIS — N1832 Chronic kidney disease, stage 3b: Secondary | ICD-10-CM | POA: Diagnosis not present

## 2022-02-23 DIAGNOSIS — C7A019 Malignant carcinoid tumor of the small intestine, unspecified portion: Secondary | ICD-10-CM | POA: Diagnosis not present

## 2022-02-23 DIAGNOSIS — D509 Iron deficiency anemia, unspecified: Secondary | ICD-10-CM | POA: Diagnosis not present

## 2022-02-23 LAB — CMP (CANCER CENTER ONLY)
ALT: 17 U/L (ref 0–44)
AST: 18 U/L (ref 15–41)
Albumin: 3.6 g/dL (ref 3.5–5.0)
Alkaline Phosphatase: 50 U/L (ref 38–126)
Anion gap: 9 (ref 5–15)
BUN: 50 mg/dL — ABNORMAL HIGH (ref 8–23)
CO2: 22 mmol/L (ref 22–32)
Calcium: 8.4 mg/dL — ABNORMAL LOW (ref 8.9–10.3)
Chloride: 105 mmol/L (ref 98–111)
Creatinine: 2.81 mg/dL — ABNORMAL HIGH (ref 0.61–1.24)
GFR, Estimated: 22 mL/min — ABNORMAL LOW (ref >60)
Glucose, Bld: 128 mg/dL — ABNORMAL HIGH (ref 70–99)
Potassium: 3.5 mmol/L (ref 3.5–5.1)
Sodium: 136 mmol/L (ref 135–145)
Total Bilirubin: 0.4 mg/dL (ref 0.3–1.2)
Total Protein: 6.3 g/dL — ABNORMAL LOW (ref 6.5–8.1)

## 2022-02-23 LAB — FERRITIN: Ferritin: 120 ng/mL (ref 24–336)

## 2022-02-23 LAB — CBC WITH DIFFERENTIAL (CANCER CENTER ONLY)
Abs Immature Granulocytes: 0.02 10*3/uL (ref 0.00–0.07)
Basophils Absolute: 0 10*3/uL (ref 0.0–0.1)
Basophils Relative: 0 %
Eosinophils Absolute: 0 10*3/uL (ref 0.0–0.5)
Eosinophils Relative: 0 %
HCT: 22.2 % — ABNORMAL LOW (ref 39.0–52.0)
Hemoglobin: 8 g/dL — ABNORMAL LOW (ref 13.0–17.0)
Immature Granulocytes: 0 %
Lymphocytes Relative: 18 %
Lymphs Abs: 1.5 10*3/uL (ref 0.7–4.0)
MCH: 31.4 pg (ref 26.0–34.0)
MCHC: 36 g/dL (ref 30.0–36.0)
MCV: 87.1 fL (ref 80.0–100.0)
Monocytes Absolute: 0.9 10*3/uL (ref 0.1–1.0)
Monocytes Relative: 10 %
Neutro Abs: 6 10*3/uL (ref 1.7–7.7)
Neutrophils Relative %: 72 %
Platelet Count: 221 10*3/uL (ref 150–400)
RBC: 2.55 MIL/uL — ABNORMAL LOW (ref 4.22–5.81)
RDW: 13.9 % (ref 11.5–15.5)
WBC Count: 8.4 10*3/uL (ref 4.0–10.5)
nRBC: 0 % (ref 0.0–0.2)

## 2022-02-23 LAB — IRON AND IRON BINDING CAPACITY (CC-WL,HP ONLY)
Iron: 35 ug/dL — ABNORMAL LOW (ref 45–182)
Saturation Ratios: 19 % (ref 17.9–39.5)
TIBC: 189 ug/dL — ABNORMAL LOW (ref 250–450)
UIBC: 154 ug/dL (ref 117–376)

## 2022-02-23 MED ORDER — LANREOTIDE ACETATE 120 MG/0.5ML ~~LOC~~ SOLN
120.0000 mg | Freq: Once | SUBCUTANEOUS | Status: AC
  Administered 2022-02-23: 120 mg via SUBCUTANEOUS
  Filled 2022-02-23: qty 120

## 2022-02-23 NOTE — Telephone Encounter (Signed)
Alan [name] is calling requesting a PA on: droxidopa ([place]) 100 MG CAPS  It was prescribed at the [facility]  recently but they dont complete the PA for it.  Pt is out of medication

## 2022-02-23 NOTE — Progress Notes (Signed)
This encounter was created in error - please disregard. Pt not available to be seen.  Will arrange f/u in home as I see as of 8/27, he discharged from [facility].

## 2022-02-23 NOTE — Telephone Encounter (Signed)
Pt's wife, Ilda Foil, has been informed Rx refill/PA would need to be discussed during the hospital follow up on Friday with Dr. Alain Marion since we have not seen pt since ER visit. [name] expressed understanding.

## 2022-02-24 ENCOUNTER — Telehealth: Payer: Self-pay | Admitting: *Deleted

## 2022-02-24 ENCOUNTER — Other Ambulatory Visit: Payer: Self-pay | Admitting: *Deleted

## 2022-02-24 DIAGNOSIS — I951 Orthostatic hypotension: Secondary | ICD-10-CM

## 2022-02-24 LAB — CHROMOGRANIN A: Chromogranin A (ng/mL): 1685 ng/mL — ABNORMAL HIGH (ref 0.0–101.8)

## 2022-02-24 NOTE — Chronic Care Management (AMB) (Signed)
  Care Coordination   Note   02/24/2022 Name: Alan [name] MRN: [id number] DOB: 12/01/1940  [name] is a 81 y.o. year old male who sees [name], MD for primary care. I reached out to [place] by phone today to offer care coordination services.  Mr. Moch was given information about Care Coordination services today including:   The Care Coordination services include support from the care team which includes your Nurse Coordinator, Clinical Social Worker, or Pharmacist.  The Care Coordination team is here to help remove barriers to the health concerns and goals most important to you. Care Coordination services are voluntary, and the patient may decline or stop services at any time by request to their care team member.   Care Coordination Consent Status: Patient agreed to services and verbal consent obtained.   Scheduled from referral   Follow up plan:  Telephone appointment with care coordination team member scheduled for:  02/25/2022  Encounter Outcome:  Pt. Scheduled  Julian Hy, [place] Direct Dial: [phone]

## 2022-02-24 NOTE — Patient Outreach (Signed)
[place] Coordinator follow up. Verified in [facility] Alan [name] discharged from [facility] on 02/22/22.   Alan [name] returned home with spouse. Will have [facility]. Writer previously discussed with both Alan [name] and spouse about [facility] care coordination re-engagement. Both were agreeable.   Will make referral to [facility] care coordination team for follow up. Alan [name] was active with [facility] embedded team recently.   Alan Rolling, MSN, RN,BSN [place] Acute Care Coordinator [phone] [facility]) [phone]  (Toll free office)

## 2022-02-25 ENCOUNTER — Telehealth: Payer: Self-pay | Admitting: Internal Medicine

## 2022-02-25 ENCOUNTER — Ambulatory Visit: Payer: Self-pay

## 2022-02-25 DIAGNOSIS — C7A019 Malignant carcinoid tumor of the small intestine, unspecified portion: Secondary | ICD-10-CM | POA: Diagnosis not present

## 2022-02-25 DIAGNOSIS — R3121 Asymptomatic microscopic hematuria: Secondary | ICD-10-CM | POA: Diagnosis not present

## 2022-02-25 DIAGNOSIS — N1832 Chronic kidney disease, stage 3b: Secondary | ICD-10-CM | POA: Diagnosis not present

## 2022-02-25 NOTE — Patient Outreach (Signed)
  Care Coordination   Initial Visit Note   02/25/2022 Name: Alan [name] MRN: [id number] DOB: 1941/04/14  Alan [name] is a 81 y.o. year old male who sees [name], MD for primary care. I spoke with  Josie Saunders Mair and spouse [name] Golberg by phone today.  What matters to the patients health and wellness today?  Recent discharge from [facility] on 02/22/22. Patient with history of orthostatic hypotension. Follow up with primary care scheduled for 02/27/22. Mrs. Sibal reports patient was taking Droxidopa while in the hospital and in skilled facility and that pharmacy will not fill unless prescribed and prior authorization done by primary care provider. Mrs. Preston states will discuss medication Droxidopa with primary care provider at visit on 02/27/22.   Goals Addressed             This Visit's Progress    Care Coordination Post Rehab       Care Coordination Interventions: Advised patient to contact PCP if condition worsens or does not improve Reviewed scheduled/upcoming provider appointments including urologist appointment today and PCP appointment 02/27/22 Pharmacy referral for medication reconciliation and assistance with obtaining medications as needed  Assessed social determinant of health barriers Reviewed fall precautions Encouraged to contact Palliative care to notify that patient is home. Confirmed they have contact number Confirmed home health start of care date established with [facility]        SDOH assessments and interventions completed:  Yes  SDOH Interventions Today    Flowsheet Row Most Recent Value  SDOH Interventions   Food Insecurity Interventions Intervention Not Indicated  Transportation Interventions Intervention Not Indicated        Care Coordination Interventions Activated:  Yes  Care Coordination Interventions:  Yes, provided   Follow up plan: Follow up call scheduled for 03/05/22    Encounter Outcome:  Pt. Visit Completed    Thea Silversmith, RN, MSN, BSN, [place] Coordinator [phone]

## 2022-02-25 NOTE — Patient Instructions (Signed)
Visit Information  Thank you for taking time to visit with me today. Please don't hesitate to contact me if I can be of assistance to you.   Following are the goals we discussed today:   Goals Addressed             This Visit's Progress    Care Coordination Post Rehab       Care Coordination Interventions: Advised patient to contact PCP if condition worsens or does not improve Reviewed scheduled/upcoming provider appointments including urologist appointment today and PCP appointment 02/27/22 Pharmacy referral for medication reconciliation and assistance with obtaining medications as needed  Assessed social determinant of health barriers Reviewed fall precautions Encouraged to contact Palliative care to notify that patient is home. Confirmed they have contact number Confirmed home health start of care date established with [name]         Our next appointment is by telephone on 03/05/22 at 1:30 pm  Please call the care guide team at [phone] if you need to cancel or reschedule your appointment.   If you are experiencing a Mental Health or [place] or need someone to talk to, please call the Suicide and Crisis Lifeline: 988  Patient verbalizes understanding of instructions and care plan provided today and agrees to view in [place]. Active MyChart status and patient understanding of how to access instructions and care plan via MyChart confirmed with patient.     Thea Silversmith, RN, MSN, BSN, CCM Care Coordinator [phone]   Fall Prevention in the Home, Adult Falls can cause injuries and can happen to people of all ages. There are many things you can do to make your home safe and to help prevent falls. Ask for help when making these changes. What actions can I take to prevent falls? General Instructions Use good lighting in all rooms. Replace any light bulbs that burn out. Turn on the lights in dark areas. Use night-lights. Keep items that you use often in  easy-to-reach places. Lower the shelves around your home if needed. Set up your furniture so you have a clear path. Avoid moving your furniture around. Do not have throw rugs or other things on the floor that can make you trip. Avoid walking on wet floors. If any of your floors are uneven, fix them. Add color or contrast paint or tape to clearly mark and help you see: Grab bars or handrails. First and last steps of staircases. Where the edge of each step is. If you use a stepladder: Make sure that it is fully opened. Do not climb a closed stepladder. Make sure the sides of the stepladder are locked in place. Ask someone to hold the stepladder while you use it. Know where your pets are when moving through your home. What can I do in the bathroom?     Keep the floor dry. Clean up any water on the floor right away. Remove soap buildup in the tub or shower. Use nonskid mats or decals on the floor of the tub or shower. Attach bath mats securely with double-sided, nonslip rug tape. If you need to sit down in the shower, use a plastic, nonslip stool. Install grab bars by the toilet and in the tub and shower. Do not use towel bars as grab bars. What can I do in the bedroom? Make sure that you have a light by your bed that is easy to reach. Do not use any sheets or blankets for your bed that hang to the floor.  Have a firm chair with side arms that you can use for support when you get dressed. What can I do in the kitchen? Clean up any spills right away. If you need to reach something above you, use a step stool with a grab bar. Keep electrical cords out of the way. Do not use floor polish or wax that makes floors slippery. What can I do with my stairs? Do not leave any items on the stairs. Make sure that you have a light switch at the top and the bottom of the stairs. Make sure that there are handrails on both sides of the stairs. Fix handrails that are broken or loose. Install nonslip  stair treads on all your stairs. Avoid having throw rugs at the top or bottom of the stairs. Choose a carpet that does not hide the edge of the steps on the stairs. Check carpeting to make sure that it is firmly attached to the stairs. Fix carpet that is loose or worn. What can I do on the outside of my home? Use bright outdoor lighting. Fix the edges of walkways and driveways and fix any cracks. Remove anything that might make you trip as you walk through a door, such as a raised step or threshold. Trim any bushes or trees on paths to your home. Check to see if handrails are loose or broken and that both sides of all steps have handrails. Install guardrails along the edges of any raised decks and porches. Clear paths of anything that can make you trip, such as tools or rocks. Have leaves, snow, or ice cleared regularly. Use sand or salt on paths during winter. Clean up any spills in your garage right away. This includes grease or oil spills. What other actions can I take? Wear shoes that: Have a low heel. Do not wear high heels. Have rubber bottoms. Feel good on your feet and fit well. Are closed at the toe. Do not wear open-toe sandals. Use tools that help you move around if needed. These include: Canes. Walkers. Scooters. Crutches. Review your medicines with your doctor. Some medicines can make you feel dizzy. This can increase your chance of falling. Ask your doctor what else you can do to help prevent falls. Where to find more information Centers for Disease Control and Prevention, STEADI: http://www.wolf.info/ National Institute on Aging: http://kim-miller.com/ Contact a doctor if: You are afraid of falling at home. You feel weak, drowsy, or dizzy at home. You fall at home. Summary There are many simple things that you can do to make your home safe and to help prevent falls. Ways to make your home safe include removing things that can make you trip and installing grab bars in the  bathroom. Ask for help when making these changes in your home. This information is not intended to replace advice given to you by your health care provider. Make sure you discuss any questions you have with your health care provider. Document Revised: 03/17/2021 Document Reviewed: 01/17/2020 Elsevier Patient Education  [place].

## 2022-02-25 NOTE — Telephone Encounter (Signed)
Stacy from [facility] called to inform us that the pt is no longer receiving care at the [facility] facility and has returned home. Marzetta Board is requesting to do a consultation for home palliative care.  For any questions please call:  Stacy: [phone] EXT.Marland Kitchen 2

## 2022-02-25 NOTE — Telephone Encounter (Signed)
Marzetta Board has been given the verbal order to proceed with palliative care consult.

## 2022-02-26 ENCOUNTER — Ambulatory Visit (INDEPENDENT_AMBULATORY_CARE_PROVIDER_SITE_OTHER): Payer: Medicare Other

## 2022-02-26 ENCOUNTER — Telehealth: Payer: Self-pay

## 2022-02-26 VITALS — Ht 73.0 in | Wt 134.0 lb

## 2022-02-26 DIAGNOSIS — Z0000 Encounter for general adult medical examination without abnormal findings: Secondary | ICD-10-CM | POA: Diagnosis not present

## 2022-02-26 NOTE — Progress Notes (Cosign Needed Addendum)
Subjective:   Alan [name] is a 81 y.o. male who presents for an Initial Medicare Annual Wellness Visit.  Review of Systems    Virtual Visit via Telephone Note  I connected with  Alan [name] on 02/26/22 at 11:00 AM EDT by telephone and verified that I am speaking with the correct person using two identifiers.  Location: Patient: Home Provider: [place] Persons participating in the virtual visit: [place]   I discussed the limitations, risks, security and privacy concerns of performing an evaluation and management service by telephone and the availability of in person appointments. The patient expressed understanding and agreed to proceed.  Interactive audio and video telecommunications were attempted between this nurse and patient, however failed, due to patient having technical difficulties OR patient did not have access to video capability.  We continued and completed visit with audio only.  Some vital signs may be absent or patient reported.   Sheral Flow, LPN  Cardiac Risk Factors include: advanced age (>40mn, >>56women);hypertension;male gender     Objective:    Today's Vitals   02/26/22 1137  Weight: 134 lb (60.8 kg)  Height: '6\' 1"'$  (1.854 m)  PainSc: 0-No pain   Body mass index is 17.68 kg/m.     02/26/2022   11:41 AM 02/02/2022    9:00 PM 02/01/2022    5:00 PM 01/29/2022    8:14 PM 01/09/2022   10:20 AM 10/20/2021    7:33 AM 04/22/2021    3:54 PM  Advanced Directives  Does Patient Have a Medical Advance Directive? Yes Yes Yes Yes Yes Yes Yes  Type of AParamedicof ARancho MurietaLiving will Healthcare Power of ALa [place] AB[place] Cityof A[place] OakLiving will [place] Does patient want to make changes to medical advance directive?  No - Patient declined  No - Patient declined  No - Patient declined No - Patient declined  Copy  of HMaldenin Chart? No - copy requested No - copy requested  No - copy requested  No - copy requested No - copy requested, Physician notified    Current Medications (verified) Outpatient Encounter Medications as of 02/26/2022  Medication Sig   albuterol (VENTOLIN HFA) 108 (90 Base) MCG/ACT inhaler Inhale 2 puffs into the lungs every 6 (six) hours as needed for wheezing or shortness of breath.   aspirin 81 MG EC tablet Take 81 mg by mouth daily. Swallow whole.   diphenoxylate-atropine (LOMOTIL) 2.5-0.025 MG tablet Take 1 tablet by mouth 2 (two) times daily as needed for diarrhea or loose stools.   droxidopa (NORTHERA) 100 MG CAPS Take 1 capsule (100 mg total) by mouth 3 (three) times daily with meals. (Patient not taking: Reported on 02/25/2022)   feeding supplement, ENSURE COMPLETE, (ENSURE COMPLETE) LIQD Take 237 mLs by mouth 2 (two) times daily between meals.   finasteride (PROSCAR) 5 MG tablet Take 5 mg by mouth daily.   fludrocortisone (FLORINEF) 0.1 MG tablet Take 1 tablet (0.1 mg total) by mouth daily.   iron polysaccharides (NIFEREX) 150 MG capsule TAKE 1 CAPSULE(150 MG) BY MOUTH 3 TIMES A WEEK (Patient taking differently: Take 150 mg by mouth 3 (three) times a week.)   LANREOTIDE ACETATE [place] Inject 1 Dose into the skin every 30 (thirty) days.   loperamide (IMODIUM A-D) 2 MG capsule Take 2 mg by mouth daily as needed for diarrhea or loose stools.  midodrine (PROAMATINE) 5 MG tablet Take 3 tablets (15 mg total) by mouth 3 (three) times daily with meals.   mirtazapine (REMERON) 30 MG tablet Take 1 tablet (30 mg total) by mouth daily.   Multiple Vitamins-Minerals (PRESERVISION AREDS 2 PO) Take 1 capsule by mouth daily.   multivitamin (ONE-A-DAY MEN'S) TABS tablet Take 1 tablet by mouth daily.   Potassium Chloride ER 20 MEQ TBCR Take 1 tablet by mouth 2 (two) times daily.   SYMBICORT 160-4.5 MCG/ACT inhaler Inhale 2 puffs into the lungs daily as needed (wheezing).    thiamine (VITAMIN B-1) 50 MG tablet Take 1 tablet (50 mg total) by mouth daily.   No facility-administered encounter medications on file as of 02/26/2022.    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   Procedure: PACEMAKER IMPLANT;  Surgeon: Constance Haw, MD;  Location: [facility];  Service: Cardiovascular;  Laterality: N/A;   Family History  Problem Relation Age of Onset   Alzheimer's disease Mother    Social History   Socioeconomic History   Marital status: Married    Spouse name: Not on file   Number of children: Not on file   Years of education: Not on file   Highest education level: Not on file  Occupational History   Not on file  Tobacco Use   Smoking status: Some Days    Packs/day: 0.30    Years: 61.00    Total pack years: 18.30    Types: Cigars, Cigarettes    Start date: 62    Last attempt to quit: 03/03/2020    Years since quitting: 1.9   Smokeless tobacco: Never   Tobacco comments:    Former Risk manager Use: Never used  Substance and Sexual Activity   Alcohol use: Not Currently   Drug use: No   Sexual activity: Not Currently  Other Topics Concern   Not on file  Social History Narrative   Right handed    Lives with wife    Social Determinants of Health   Financial Resource Strain: Low Risk  (02/26/2022)   Overall Financial Resource Strain (CARDIA)    Difficulty of Paying Living Expenses: Not hard at all  Food Insecurity: No Food Insecurity (02/26/2022)   Hunger Vital Sign    Worried About Running Out of Food in the Last Year: Never true    Ran Out of Food in the Last  Year: Never true  Transportation Needs: No Transportation Needs (02/26/2022)   PRAPARE - Hydrologist (Medical): No    Lack of Transportation (Non-Medical): No  Physical Activity: Inactive (02/26/2022)   Exercise Vital Sign    Days of Exercise per Week: 0 days    Minutes of Exercise per Session: 0 min  Stress: Stress Concern Present (02/26/2022)   [place]    Feeling of Stress : Very much  Social Connections: Socially Integrated (02/26/2022)   Social Connection and Isolation Panel [NHANES]    Frequency of Communication with Friends  and Family: More than three times a week    Frequency of Social Gatherings with Friends and Family: More than three times a week    Attends Religious Services: More than 4 times per year    Active Member of [employer] or Organizations: Yes    Attends Music therapist: More than 4 times per year    Marital Status: Married    Tobacco Counseling Ready to quit: Not Answered Counseling given: Not Answered Tobacco comments: Former cig    Clinical Intake:  Pre-visit preparation completed: Yes  Pain : 0-10 Pain Score: 0-No pain Pain Type: Chronic pain Pain Location: Abdomen Pain Descriptors / Indicators: Discomfort, Aching Pain Onset: More than a month ago Pain Frequency: Intermittent     BMI - recorded: 17.68 (wife stated) Nutritional Status: BMI <19  Underweight Nutritional Risks: Unintentional weight loss, Nausea/ vomitting/ diarrhea Diabetes: No  How often do you need to have someone help you when you read instructions, pamphlets, or other written materials from your doctor or pharmacy?: 1 - Never What is the last grade level you completed in school?: Master's Degree  Diabetic? no  Interpreter Needed?: No  Information entered by :: Lisette Abu, LPN.   Activities of Daily Living    02/26/2022   12:02 PM 02/02/2022    9:00 PM  In  your present state of health, do you have any difficulty performing the following activities:  Hearing? 1 1  Vision? 0 0  Difficulty concentrating or making decisions? 1 1  Walking or climbing stairs? 1 1  Dressing or bathing? 0 0  Doing errands, shopping? 1 1  Preparing Food and eating ? Y   Using the Toilet? Y   In the past six months, have you accidently leaked urine? Y   Do you have problems with loss of bowel control? Y   Managing your Medications? N   Managing your Finances? Y   Housekeeping or managing your Housekeeping? Y     Patient Care Team: [name], MD as PCP - General (Internal Medicine) Sueanne Margarita, MD as PCP - Cardiology (Cardiology) Constance Haw, MD as PCP - Electrophysiology (Cardiology) Brunetta Genera, MD as Consulting Physician (Hematology) Pickenpack-Cousar, Carlena Sax, NP as Nurse Practitioner (Nurse Practitioner) Cameron Sprang, MD as Consulting Physician (Neurology) Luretha Rued, RN as [place] Management Lisabeth Pick, MD as Consulting Physician (Ophthalmology)  Indicate any recent Medical Services you may have received from other than [facility] providers in the past year (date may be approximate).     Assessment:   This is a routine wellness examination for Alan [name].  Hearing/Vision screen Hearing Screening - Comments:: Patient wears hearing aids. Vision Screening - Comments:: Wears rx glasses - up to date with routine eye exams with Arnoldo Hooker, MD   Dietary issues and exercise activities discussed: Current Exercise Habits: The patient does not participate in regular exercise at present, Exercise limited by: Other - see comments (Malignant carcinoid tumor of small intestines)   Goals Addressed             This Visit's Progress    To feel better.        Depression Screen    02/26/2022   11:59 AM 01/28/2022   11:34 AM 11/10/2021   11:14 AM 07/02/2021   10:30 AM 02/19/2021    3:40 PM  PHQ 2/9 Scores   PHQ - 2 Score '6 6 5 3 6  '$ PHQ- 9 Score 23  $'26 14 12 18    'u$ Fall Risk    02/26/2022   11:42 AM 01/28/2022   11:33 AM 01/09/2022   10:20 AM 01/07/2022   11:00 AM 12/02/2021    9:45 AM  Fall Risk   Falls in the past year? '1 1 1 1 1  '$ Comment    multiple falls x last 12 months; some with injury- due to orthostatic hypotension; continues using cane/ walker/ wheelchair regularly reports last fall without injury, "end of May" uses cane, walker, and wheelchair as needed/ indicated  Number falls in past yr: '1 1 1 1 1  '$ Injury with Fall? '1 1 1 1 1  '$ Risk for fall due to : History of fall(s) History of fall(s)  Impaired balance/gait;History of fall(s);Impaired mobility;Medication side effect Impaired balance/gait;Impaired mobility;History of fall(s);Medication side effect;Other (Comment)  Risk for fall due to: Comment     orthostatic hypotension  Follow up Falls evaluation completed Falls evaluation completed  Falls prevention discussed Falls prevention discussed    FALL RISK PREVENTION PERTAINING TO THE HOME:  Any stairs in or around the home? Yes  (outside) If so, are there any without handrails? No  Home free of loose throw rugs in walkways, pet beds, electrical cords, etc? Yes  Adequate lighting in your home to reduce risk of falls? Yes   ASSISTIVE DEVICES UTILIZED TO PREVENT FALLS:  Life alert? No  Use of a cane, walker or w/c? Yes  Grab bars in the bathroom? Yes  Shower chair or bench in shower? Yes  Elevated toilet seat or a handicapped toilet? Yes   TIMED UP AND GO:  Was the test performed? No .  Length of time to ambulate 10 feet: n/a sec.   Appearance of gait: Gait not evaluated during this visit.  Cognitive Function:        02/26/2022   12:12 PM  6CIT Screen  What Year? 0 points  What month? 0 points  What time? 0 points  Count back from 20 0 points  Months in reverse 0 points  Repeat phrase 0 points  Total Score 0 points    Immunizations Immunization History   Administered Date(s) Administered   Influenza Split 07/12/2012   Influenza,inj,quad, With Preservative 04/24/2014   PFIZER(Purple Top)SARS-COV-2 Vaccination 07/20/2019, 08/11/2019, 04/05/2020, 11/04/2020   Pneumococcal Polysaccharide-23 01/17/2021    TDAP status: declined  Flu Vaccine status: Due, Education has been provided regarding the importance of this vaccine. Advised may receive this vaccine at local pharmacy or Health Dept. Aware to provide a copy of the vaccination record if obtained from local pharmacy or Health Dept. Verbalized acceptance and understanding.  Pneumococcal vaccine status: Due, Education has been provided regarding the importance of this vaccine. Advised may receive this vaccine at local pharmacy or Health Dept. Aware to provide a copy of the vaccination record if obtained from local pharmacy or Health Dept. Verbalized acceptance and understanding.  Covid-19 vaccine status: Completed vaccines  Qualifies for Shingles Vaccine? Yes   Zostavax completed No   Shingrix Completed?: No.    Education has been provided regarding the importance of this vaccine. Patient has been advised to call insurance company to determine out of pocket expense if they have not yet received this vaccine. Advised may also receive vaccine at local pharmacy or Health Dept. Verbalized acceptance and understanding.  Screening Tests Health Maintenance  Topic Date Due   TETANUS/TDAP  Never done   Zoster Vaccines- Shingrix (1 of 2) Never done   COVID-19 Vaccine (6 -  Pfizer risk series) 05/19/2021   Pneumonia Vaccine 57+ Years old (2 - PCV) 01/17/2022   INFLUENZA VACCINE  01/27/2022   HPV VACCINES  Aged Out    Health Maintenance  Health Maintenance Due  Topic Date Due   TETANUS/TDAP  Never done   Zoster Vaccines- Shingrix (1 of 2) Never done   COVID-19 Vaccine (6 - Pfizer risk series) 05/19/2021   Pneumonia Vaccine 61+ Years old (2 - PCV) 01/17/2022   INFLUENZA VACCINE  01/27/2022     Colorectal cancer screening: No longer required.   Lung Cancer Screening: (Low Dose CT Chest recommended if Age 60-80 years, 30 pack-year currently smoking OR have quit w/in 15years.) does not qualify.   Lung Cancer Screening Referral: no  Additional Screening:  Hepatitis C Screening: does not qualify; Completed no  Vision Screening: Recommended annual ophthalmology exams for early detection of glaucoma and other disorders of the eye. Is the patient up to date with their annual eye exam?  Yes  Who is the provider or what is the name of the office in which the patient attends annual eye exams? Arnoldo Hooker, MD. If pt is not established with a provider, would they like to be referred to a provider to establish care? No .   Dental Screening: Recommended annual dental exams for proper oral hygiene  Community Resource Referral / Chronic Care Management: CRR required this visit?  No   CCM required this visit?  No      Plan:     I have personally reviewed and noted the following in the patient's chart:   Medical and social history Use of alcohol, tobacco or illicit drugs  Current medications and supplements including opioid prescriptions. Patient is not currently taking opioid prescriptions. Functional ability and status Nutritional status Physical activity Advanced directives List of other physicians Hospitalizations, surgeries, and ER visits in previous 12 months Vitals Screenings to include cognitive, depression, and falls Referrals and appointments  In addition, I have reviewed and discussed with patient certain preventive protocols, quality metrics, and best practice recommendations. A written personalized care plan for preventive services as well as general preventive health recommendations were provided to patient.     Sheral Flow, LPN   12/31/8887   Nurse Notes:  Patient is cogitatively intact. There were no vitals filed for this visit. Patient provided  weight for this visit.   Medical screening examination/treatment/procedure(s) were performed by non-physician practitioner and as supervising physician I was immediately available for consultation/collaboration.  I agree with above. Lew Dawes, MD

## 2022-02-26 NOTE — Telephone Encounter (Signed)
Called patient x 3 lvm to return call, Patient may reschedule for the next available appointment. S. Shondell Fabel,LPN 

## 2022-02-26 NOTE — Telephone Encounter (Signed)
Spoke with patient's spouse Alan "Debroah Baller" and scheduled a Mychart Palliative Consult for 03/16/22 @ 2:30 PM  Consent obtained; updated Netsmart, Team List and Epic.

## 2022-02-26 NOTE — Telephone Encounter (Signed)
Attempted to contact patient's spouse [name] to schedule a Palliative Care consult appointment. No answer left a message to return call.

## 2022-02-26 NOTE — Patient Instructions (Signed)
Alan [name] , Thank you for taking time to come for your Medicare Wellness Visit. I appreciate your ongoing commitment to your health goals. Please review the following plan we discussed and let me know if I can assist you in the future.   Screening recommendations/referrals: Colonoscopy: No longer recommended due to age; last done 07/25/2020 Recommended yearly ophthalmology/optometry visit for glaucoma screening and checkup Recommended yearly dental visit for hygiene and checkup  Vaccinations: Influenza vaccine: 05/06/2021; due Fall 2023 Pneumococcal vaccine: 01/17/2021; Prevnar 20 is due at next visit Tdap vaccine: declined Shingles vaccine: declined   Covid-19: 07/19/2049, 2/12/20212, 04/05/2020, 11/04/2020, 9/269/2022  Advanced directives: Yes; Please bring a copy of your health care power of attorney and living will to the office at your convenience.  Conditions/risks identified: Yes  Next appointment: Follow up in one year for your annual wellness visit with Nurse Mignon Pine.  Preventive Care 76 Years and Older, Male  Preventive care refers to lifestyle choices and visits with your health care provider that can promote health and wellness. What does preventive care include? A yearly physical exam. This is also called an annual well check. Dental exams once or twice a year. Routine eye exams. Ask your health care provider how often you should have your eyes checked. Personal lifestyle choices, including: Daily care of your teeth and gums. Regular physical activity. Eating a healthy diet. Avoiding tobacco and drug use. Limiting alcohol use. Practicing safe sex. Taking low doses of aspirin every day. Taking vitamin and mineral supplements as recommended by your health care provider. What happens during an annual well check? The services and screenings done by your health care provider during your annual well check will depend on your age, overall health, lifestyle risk factors, and  family history of disease. Counseling  Your health care provider may ask you questions about your: Alcohol use. Tobacco use. Drug use. Emotional well-being. Home and relationship well-being. Sexual activity. Eating habits. History of falls. Memory and ability to understand (cognition). Work and work Statistician. Screening  You may have the following tests or measurements: Height, weight, and BMI. Blood pressure. Lipid and cholesterol levels. These may be checked every 5 years, or more frequently if you are over 56 years old. Skin check. Lung cancer screening. You may have this screening every year starting at age 27 if you have a 30-pack-year history of smoking and currently smoke or have quit within the past 15 years. Fecal occult blood test (FOBT) of the stool. You may have this test every year starting at age 7. Flexible sigmoidoscopy or colonoscopy. You may have a sigmoidoscopy every 5 years or a colonoscopy every 10 years starting at age 73. Prostate cancer screening. Recommendations will vary depending on your family history and other risks. Hepatitis C blood test. Hepatitis B blood test. Sexually transmitted disease (STD) testing. Diabetes screening. This is done by checking your blood sugar (glucose) after you have not eaten for a while (fasting). You may have this done every 1-3 years. Abdominal aortic aneurysm (AAA) screening. You may need this if you are a current or former smoker. Osteoporosis. You may be screened starting at age 83 if you are at high risk. Talk with your health care provider about your test results, treatment options, and if necessary, the need for more tests. Vaccines  Your health care provider may recommend certain vaccines, such as: Influenza vaccine. This is recommended every year. Tetanus, diphtheria, and acellular pertussis (Tdap, Td) vaccine. You may need a Td booster every 10  years. Zoster vaccine. You may need this after age 37. Pneumococcal  13-valent conjugate (PCV13) vaccine. One dose is recommended after age 36. Pneumococcal polysaccharide (PPSV23) vaccine. One dose is recommended after age 85. Talk to your health care provider about which screenings and vaccines you need and how often you need them. This information is not intended to replace advice given to you by your health care provider. Make sure you discuss any questions you have with your health care provider. Document Released: 07/12/2015 Document Revised: 03/04/2016 Document Reviewed: 04/16/2015 Elsevier Interactive Patient Education  2017 [employer] Prevention in the Home Falls can cause injuries. They can happen to people of all ages. There are many things you can do to make your home safe and to help prevent falls. What can I do on the outside of my home? Regularly fix the edges of walkways and driveways and fix any cracks. Remove anything that might make you trip as you walk through a door, such as a raised step or threshold. Trim any bushes or trees on the path to your home. Use bright outdoor lighting. Clear any walking paths of anything that might make someone trip, such as rocks or tools. Regularly check to see if handrails are loose or broken. Make sure that both sides of any steps have handrails. Any raised decks and porches should have guardrails on the edges. Have any leaves, snow, or ice cleared regularly. Use sand or salt on walking paths during winter. Clean up any spills in your garage right away. This includes oil or grease spills. What can I do in the bathroom? Use night lights. Install grab bars by the toilet and in the tub and shower. Do not use towel bars as grab bars. Use non-skid mats or decals in the tub or shower. If you need to sit down in the shower, use a plastic, non-slip stool. Keep the floor dry. Clean up any water that spills on the floor as soon as it happens. Remove soap buildup in the tub or shower regularly. Attach  bath mats securely with double-sided non-slip rug tape. Do not have throw rugs and other things on the floor that can make you trip. What can I do in the bedroom? Use night lights. Make sure that you have a light by your bed that is easy to reach. Do not use any sheets or blankets that are too big for your bed. They should not hang down onto the floor. Have a firm chair that has side arms. You can use this for support while you get dressed. Do not have throw rugs and other things on the floor that can make you trip. What can I do in the kitchen? Clean up any spills right away. Avoid walking on wet floors. Keep items that you use a lot in easy-to-reach places. If you need to reach something above you, use a strong step stool that has a grab bar. Keep electrical cords out of the way. Do not use floor polish or wax that makes floors slippery. If you must use wax, use non-skid floor wax. Do not have throw rugs and other things on the floor that can make you trip. What can I do with my stairs? Do not leave any items on the stairs. Make sure that there are handrails on both sides of the stairs and use them. Fix handrails that are broken or loose. Make sure that handrails are as long as the stairways. Check any carpeting to make sure  that it is firmly attached to the stairs. Fix any carpet that is loose or worn. Avoid having throw rugs at the top or bottom of the stairs. If you do have throw rugs, attach them to the floor with carpet tape. Make sure that you have a light switch at the top of the stairs and the bottom of the stairs. If you do not have them, ask someone to add them for you. What else can I do to help prevent falls? Wear shoes that: Do not have high heels. Have rubber bottoms. Are comfortable and fit you well. Are closed at the toe. Do not wear sandals. If you use a stepladder: Make sure that it is fully opened. Do not climb a closed stepladder. Make sure that both sides of the  stepladder are locked into place. Ask someone to hold it for you, if possible. Clearly mark and make sure that you can see: Any grab bars or handrails. First and last steps. Where the edge of each step is. Use tools that help you move around (mobility aids) if they are needed. These include: Canes. Walkers. Scooters. Crutches. Turn on the lights when you go into a dark area. Replace any light bulbs as soon as they burn out. Set up your furniture so you have a clear path. Avoid moving your furniture around. If any of your floors are uneven, fix them. If there are any pets around you, be aware of where they are. Review your medicines with your doctor. Some medicines can make you feel dizzy. This can increase your chance of falling. Ask your doctor what other things that you can do to help prevent falls. This information is not intended to replace advice given to you by your health care provider. Make sure you discuss any questions you have with your health care provider. Document Released: 04/11/2009 Document Revised: 11/21/2015 Document Reviewed: 07/20/2014 Elsevier Interactive Patient Education  2017 [employer].

## 2022-02-27 ENCOUNTER — Telehealth: Payer: Self-pay | Admitting: Internal Medicine

## 2022-02-27 ENCOUNTER — Inpatient Hospital Stay: Payer: Medicare Other

## 2022-02-27 ENCOUNTER — Encounter: Payer: Self-pay | Admitting: Internal Medicine

## 2022-02-27 ENCOUNTER — Ambulatory Visit (INDEPENDENT_AMBULATORY_CARE_PROVIDER_SITE_OTHER): Payer: Medicare Other | Admitting: Internal Medicine

## 2022-02-27 ENCOUNTER — Telehealth: Payer: Self-pay

## 2022-02-27 VITALS — BP 154/90 | HR 67 | Temp 98.1°F | Wt 130.0 lb

## 2022-02-27 DIAGNOSIS — N184 Chronic kidney disease, stage 4 (severe): Secondary | ICD-10-CM | POA: Diagnosis not present

## 2022-02-27 DIAGNOSIS — D539 Nutritional anemia, unspecified: Secondary | ICD-10-CM | POA: Diagnosis not present

## 2022-02-27 DIAGNOSIS — I951 Orthostatic hypotension: Secondary | ICD-10-CM | POA: Diagnosis not present

## 2022-02-27 MED ORDER — CYANOCOBALAMIN 1000 MCG/ML IJ SOLN
1000.0000 ug | Freq: Once | INTRAMUSCULAR | Status: AC
  Administered 2022-02-27: 1000 ug via INTRAMUSCULAR

## 2022-02-27 MED ORDER — DROXIDOPA 100 MG PO CAPS: 100.0000 mg | ORAL_CAPSULE | Freq: Three times a day (TID) | ORAL | 2 refills | Status: DC

## 2022-02-27 NOTE — Telephone Encounter (Signed)
PA needed for Droxidopa, unable to start due to insurance purposes. Called patient for updated insurance info but had to leave a message. If patient or his wife calls back,please update insurance so we can start the PA.

## 2022-02-27 NOTE — Progress Notes (Signed)
Subjective:  Patient ID: Rayon Mcchristian Vandenheuvel, male    DOB: [birth year]  Age: 81 y.o. MRN: [id number]  CC: Hospital follow up (Discharged from rehab, is passing out. Taking Midodrine since was not able to fill Droxidopa)   HPI Rumaldo Difatta Plagge presents for recurrent syncope, orthostatic hypotension, carcinoid tumor. She was in the rehab facility for 2 weeks.  He is back at home.  He does not have any droxidopa at home.  He does not have a prescription.  He had to go back on midodrine.  Per hx:  "Admit date: 02/01/2022 Discharge date: 02/09/2022   Admitted From: Home Disposition:  SNF [facility]   Recommendations for Outpatient Follow-up:  Follow up with PCP in 1-2 weeks Please obtain BMP/CBC in one week daily dressing changes (pack w/ iodoform gauze, cover w/ dry gauze and foam)   Home Health:No Equipment/Devices:None   Discharge Condition:Stable CODE STATUS:Full Diet recommendation: Heart Healthy   Brief/Interim Summary: 81 y.o. male with medical history significant of  intestinal carcinoid, chronic diarrhea, CKD stage 3, anemia, paroxysmal atrial fibrillation, pulmonary embolism, tachy brady syndrome sp pacemaker, orthostatic hypotension, who was recently discharged today after  hospital stay 8/3-8/6  for recurrent syncope above his baseline due to progressive orthostatic hypotension. Patient was treated with ivfs with improvement and per wife only had one episode of syncope while in house which is close to his baseline. He was discharged on midodrine and florinef.  Patient states day of discharge he did not feel well. He describes feeling as feeling heavy. He states he did not mention this because he wanted to go home. However at home he continue to feel unwell and had two syncopal episodes with bp 60 systolic in the field per notes.  Patient currently states he has been complaint with his medications, he notes he did have episode of loose stools and note he did not eat well. He notes no  chest pain , sob,  n/v/. He does endorse weight loss.    Discharge Diagnoses:  Active Problems:   Orthostatic hypotension   Paroxysmal atrial fibrillation (HCC)   Syncope   CKD (chronic kidney disease), stage IV (HCC)   Acute on chronic anemia   Protein-calorie malnutrition, severe   Syncope due to orthostatic hypotension: With an ongoing recurring mention patient was recently discharged on 02/01/2022 come back to the hospital half a day later with similar symptoms.  He was continued on midodrine and Florinef despite this he was having orthostatic hypotension and syncopal episodes including while laying in bed. Cardiology was consulted recommended to continue Florinef and start Northera he was monitored 48 hours with no episodes of syncope or passing out. Patient was frustrated and demanding a cure he was instructed by the previous physician that there is a combination of his carcinoid tumor, tachybradycardia syndrome and atrial fibrillation. He is currently on lanreotide every 4 weeksx 6. Realistic expectation were discussed with the patient.   History of atrial fibrillation: Rate control not on anticoagulation due to history of GI bleed.  Anemia of chronic disease: Hemoglobin has remained close to baseline.  Hypomagnesemia/hypokalemia: Repleted now resolved.  History of PE: Lower extremity Doppler was negative for DVT VQ scan showed intermediate to high probability PE, CT angio of the chest cannot be done due to his chronic kidney disease stage IV. The previous physician spoke with the patient that he clinically does not appear to have a PE especially in the lower extremity DVT has been ruled out he had  a very lengthy discussion with the patient and the wife about the risk and benefits of being on anticoagulation versus not being in the light of history of recent GI bleed.  The patient the wife and the previous physician made a mutual decision not to start him on anticoagulation and watch  closely for symptoms.  He was made aware that if he develops significant tachycardia chest pain or shortness of breath he needs to seek immediate medical attention.   Chronic kidney disease stage IV: Left renal mass follow-up with urology as an outpatient his creatinine remained at baseline.   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 albuterol (VENTOLIN HFA) 108 (90 Base) MCG/ACT inhaler Inhale 2 puffs into the lungs every 6 (six) hours as needed for wheezing or shortness of breath. 18 g 5  . aspirin 81 MG EC tablet Take 81 mg by mouth daily. Swallow whole.    . diphenoxylate-atropine (LOMOTIL) 2.5-0.025 MG tablet Take 1 tablet by mouth 2 (two) times daily as needed for diarrhea or loose stools. 30 tablet 0  . feeding supplement, ENSURE COMPLETE, (ENSURE COMPLETE) LIQD Take 237 mLs by mouth 2 (two) times daily between meals.    . finasteride (PROSCAR) 5 MG tablet Take 5 mg by mouth daily.    . fludrocortisone (FLORINEF) 0.1 MG tablet Take 1 tablet (0.1 mg total) by mouth daily. 90 tablet 3  . iron polysaccharides (NIFEREX) 150 MG capsule TAKE 1 CAPSULE(150 MG) BY MOUTH 3 TIMES A WEEK (Patient taking differently: Take 150 mg by mouth 3 (three) times a week.) 30 capsule 2  . LANREOTIDE ACETATE [place] Inject 1 Dose into the skin every 30 (thirty) days.    Marland Kitchen loperamide (IMODIUM A-D) 2 MG capsule Take 2 mg by mouth daily as needed for diarrhea or loose stools.    . mirtazapine (REMERON) 30 MG tablet Take 1 tablet (30 mg total) by mouth daily. 90 tablet 3  . Multiple Vitamins-Minerals (PRESERVISION AREDS 2 PO) Take 1 capsule by mouth daily.    . multivitamin (ONE-A-DAY MEN'S) TABS tablet Take 1 tablet by mouth daily.    . Potassium Chloride ER 20 MEQ TBCR Take 1 tablet by mouth 2 (two) times daily. 60 tablet 5  . SYMBICORT 160-4.5 MCG/ACT  inhaler Inhale 2 puffs into the lungs daily as needed (wheezing). 3 each 1  . thiamine (VITAMIN B-1) 50 MG tablet Take 1 tablet (50 mg total) by mouth daily. 90 tablet 1  . droxidopa (NORTHERA) 100 MG CAPS Take 1 capsule (100 mg total) by mouth 3 (three) times daily with meals. 90 capsule 2  . midodrine (PROAMATINE) 5 MG tablet Take 3 tablets (15 mg total) by mouth 3 (three) times daily with meals. 270 tablet 5   No facility-administered medications prior to visit.    ROS: Review of Systems  Objective:  BP (!) 154/90 (BP Location: Right Arm, Patient Position: Sitting, Cuff Size: Normal)   Pulse 67   Temp 98.1 F (36.7 C) (Oral)   Wt 130 lb (59 kg)   SpO2 93%   BMI 17.15 kg/m   BP Readings from Last 3 Encounters:  02/27/22 (!) 154/90  02/23/22 112/72  02/09/22 (!) 148/80    Wt Readings from Last 3 Encounters:  02/27/22 130 lb (59 kg)  02/26/22 134 lb (60.8 kg)  02/09/22 130 lb 1.1 oz (59 kg)  Physical Exam  Lab Results  Component Value Date   WBC 8.4 02/23/2022   HGB 8.0 (L) 02/23/2022   HCT 22.2 (L) 02/23/2022   PLT 221 02/23/2022   GLUCOSE 128 (H) 02/23/2022   ALT 17 02/23/2022   AST 18 02/23/2022   NA 136 02/23/2022   K 3.5 02/23/2022   CL 105 02/23/2022   CREATININE 2.81 (H) 02/23/2022   BUN 50 (H) 02/23/2022   CO2 22 02/23/2022   TSH 1.591 04/22/2021   INR 1.0 01/16/2021   HGBA1C 5.1 01/16/2021    VAS [place] LOWER EXTREMITY VENOUS (DVT)  Result Date: 02/02/2022  Lower Venous DVT Study Patient Name:  BROUGHTON EPPINGER [facility]  Date of Exam:   02/02/2022 Medical Rec #: [id number]        Accession #:    [id number] Date of Birth: 07-08-1940         Patient Gender: M Patient Age:   52 years Exam Location:  [facility] Procedure:      VAS [place] LOWER EXTREMITY VENOUS (DVT) Referring Phys: RAVI PAHWANI --------------------------------------------------------------------------------  Indications: Edema.  Comparison Study: prior 01/19/21 Performing Technologist: Archie Patten  RVS  Examination Guidelines: A complete evaluation includes B-mode imaging, spectral Doppler, color Doppler, and power Doppler as needed of all accessible portions of each vessel. Bilateral testing is considered an integral part of a complete examination. Limited examinations for reoccurring indications may be performed as noted. The reflux portion of the exam is performed with the patient in reverse Trendelenburg.  +---------+---------------+---------+-----------+----------+--------------+ RIGHT    CompressibilityPhasicitySpontaneityPropertiesThrombus Aging +---------+---------------+---------+-----------+----------+--------------+ CFV      Full           Yes      Yes                                 +---------+---------------+---------+-----------+----------+--------------+ SFJ      Full                                                        +---------+---------------+---------+-----------+----------+--------------+ FV Prox  Full                                                        +---------+---------------+---------+-----------+----------+--------------+ FV Mid   Full                                                        +---------+---------------+---------+-----------+----------+--------------+ FV DistalFull                                                        +---------+---------------+---------+-----------+----------+--------------+ PFV      Full                                                        +---------+---------------+---------+-----------+----------+--------------+  POP      Full           Yes      Yes                                 +---------+---------------+---------+-----------+----------+--------------+ PTV      Full                                                        +---------+---------------+---------+-----------+----------+--------------+ PERO     Full                                                         +---------+---------------+---------+-----------+----------+--------------+   +---------+---------------+---------+-----------+----------+--------------+ LEFT     CompressibilityPhasicitySpontaneityPropertiesThrombus Aging +---------+---------------+---------+-----------+----------+--------------+ CFV      Full           Yes      Yes                                 +---------+---------------+---------+-----------+----------+--------------+ SFJ      Full                                                        +---------+---------------+---------+-----------+----------+--------------+ FV Prox  Full                                                        +---------+---------------+---------+-----------+----------+--------------+ FV Mid   Full                                                        +---------+---------------+---------+-----------+----------+--------------+ FV DistalFull                                                        +---------+---------------+---------+-----------+----------+--------------+ PFV      Full                                                        +---------+---------------+---------+-----------+----------+--------------+ POP      Full           Yes      Yes                                 +---------+---------------+---------+-----------+----------+--------------+  PTV      Full                                                        +---------+---------------+---------+-----------+----------+--------------+ PERO     Full                                                        +---------+---------------+---------+-----------+----------+--------------+     Summary: BILATERAL: - No evidence of deep vein thrombosis seen in the lower extremities, bilaterally. -No evidence of popliteal cyst, bilaterally.   *See table(s) above for measurements and observations. Electronically signed by Servando Snare MD on 02/02/2022 at 5:13:58 PM.     Final    NM Pulmonary Perfusion  Result Date: 02/02/2022 CLINICAL DATA:  Syncope, shortness of breath EXAM: NUCLEAR MEDICINE PERFUSION LUNG SCAN TECHNIQUE: Perfusion images were obtained in multiple projections after intravenous injection of radiopharmaceutical. Ventilation scans intentionally deferred if perfusion scan and chest x-ray adequate for interpretation during COVID 19 epidemic. RADIOPHARMACEUTICALS:  Four mCi Tc-75mMAA IV COMPARISON:  Previous studies including the chest radiographs done today and CT chest done on 01/16/2021 FINDINGS: There are multiple subsegmental and segmental foci of decreased perfusion in the mid and lower lung fields on both sides, more so on the left side. There is no infiltrate in the lung fields in the chest radiograph. Evaluation is somewhat limited without ventilation images. IMPRESSION: There are multiple segmental and subsegmental foci of decreased perfusion in both lungs, more so in the left lower lung field. Evaluation is limited without ventilation images. No focal abnormalities are seen in the lung fields in the chest radiograph. Findings suggest intermediate to high probability for pulmonary embolism. If clinically warranted, please consider venous Doppler examination of both lower extremities or CT pulmonary angiogram. Electronically Signed   By: PElmer PickerM.D.   On: 02/02/2022 15:54   DG CHEST PORT 1 VIEW  Result Date: 02/02/2022 CLINICAL DATA:  Short of breath EXAM: PORTABLE CHEST 1 VIEW COMPARISON:  01/29/2022 FINDINGS: No new consolidation or edema. No pleural effusion or pneumothorax. Stable cardiomediastinal contours with normal heart size. Left chest wall dual lead pacemaker. IMPRESSION: No acute process in the chest. Electronically Signed   By: PMacy MisM.D.   On: 02/02/2022 13:00    Assessment & Plan:   Problem List Items Addressed This Visit     Deficiency anemia - Primary   Relevant Medications   cyanocobalamin (VITAMIN B12)  injection 1,000 mcg (Start on 02/27/2022 12:30 PM)   Orthostatic hypotension     Taking Midodrine since was not able to fill Droxidopa Restart Droxidopa 100-200 mg tid Hydrate well       Relevant Medications   droxidopa (NORTHERA) 100 MG CAPS      Meds ordered this encounter  Medications  . droxidopa (NORTHERA) 100 MG CAPS    Sig: Take 1-2 capsules (100-200 mg total) by mouth 3 (three) times daily with meals.    Dispense:  180 capsule    Refill:  2  . cyanocobalamin (VITAMIN B12) injection 1,000 mcg      Follow-up: Return in about 4 weeks (around 03/27/2022) for a follow-up visit.  Walker Kehr, MD

## 2022-02-27 NOTE — Telephone Encounter (Signed)
Angel from [facility] called to inform us that they will be delaying care until Tuesday, the pt was not available today and [place] will be of for the holiday on Monday.   For any questions please call:  Angel: [phone]

## 2022-02-27 NOTE — Assessment & Plan Note (Signed)
Taking Midodrine since was not able to fill Droxidopa Restart Droxidopa 100-200 mg tid Hydrate well

## 2022-02-27 NOTE — Telephone Encounter (Signed)
Noted  

## 2022-03-01 MED FILL — Medication: Qty: 1 | Status: AC

## 2022-03-02 ENCOUNTER — Encounter: Payer: Self-pay | Admitting: Hematology and Oncology

## 2022-03-02 NOTE — Progress Notes (Addendum)
HEMATOLOGY/ONCOLOGY CLINIC NOTE  Date of Service: .02/23/2022   Patient Care Team: [name], MD as PCP - General (Internal Medicine) Sueanne Margarita, MD as PCP - Cardiology (Cardiology) Constance Haw, MD as PCP - Electrophysiology (Cardiology) Brunetta Genera, MD as Consulting Physician (Hematology) Pickenpack-Cousar, Carlena Sax, NP as Nurse Practitioner (Nurse Practitioner) Cameron Sprang, MD as Consulting Physician (Neurology) Luretha Rued, RN as [place] Management Lisabeth Pick, MD as Consulting Physician (Ophthalmology)  CHIEF COMPLAINTS/PURPOSE OF CONSULTATION:  For continued evaluation and management of metastatic intestinal neuroendocrine tumor  HISTORY OF PRESENTING ILLNESS:  Please see previous note for details on initial presentation  [place] is here for follow-up of his metastatic small intestinal carcinoid tumor.  He continues to have diarrhea 4-5 times a day.  No toxicities from his lanreotide at this time.  He has had difficulties maintaining his p.o. intake and was also in the hospital for syncope and collapse on 2 occasions in early August requiring discharge to SNF.  He is being managed by cardiology for his orthostatic hypotension. Given his limiting renal function and recurrent hospitalizations he has not been a candidate for Lutathera at this time. He continues to use as needed loperamide as well as Lomotil to control his diarrhea. No new skin rashes. No other acute new symptoms at this time. Labs done today were discussed with him in detail.  His wife was also present for this clinic visit.  MEDICAL HISTORY:  Past Medical History:  Diagnosis Date   Asthma    Carcinoid tumor, small intestine, malignant (HCC)    Chronic diarrhea    CKD (chronic kidney disease) stage 3, GFR 30-59 ml/min (HCC)    HTN (hypertension)    Short gut syndrome     SURGICAL HISTORY: Past Surgical History:   Procedure Laterality Date   COLON SURGERY     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   Procedure: PACEMAKER IMPLANT;  Surgeon: Constance Haw, MD;  Location: [facility];  Service: Cardiovascular;  Laterality: N/A;    SOCIAL HISTORY: Social History   Socioeconomic History   Marital status: Married    Spouse name: Not on file   Number of children: Not on file   Years of education: Not on file   Highest education level: Not on file  Occupational History   Not on file  Tobacco Use   Smoking status: Some Days    Packs/day: 0.30    Years: 61.00    Total pack years: 18.30    Types: Cigars, Cigarettes    Start date: 17    Last attempt to quit: 03/03/2020    Years since quitting: 1.9   Smokeless tobacco: Never   Tobacco comments:    Former Risk manager Use: Never used  Substance and Sexual Activity   Alcohol use: Not Currently   Drug use: No   Sexual activity: Not Currently  Other Topics Concern   Not on file  Social History Narrative   Right handed    Lives with wife    Social Determinants of Health   Financial Resource Strain: Low Risk  (02/26/2022)   Overall Financial Resource Strain (CARDIA)    Difficulty of Paying Living Expenses: Not hard at all  Food Insecurity: No Food Insecurity (02/26/2022)   Hunger Vital Sign    Worried About Running Out of Food in the Last Year: Never true  Ran Out of Food in the Last Year: Never true  Transportation Needs: No Transportation Needs (02/26/2022)   PRAPARE - Hydrologist (Medical): No    Lack of Transportation (Non-Medical): No  Physical Activity: Inactive (02/26/2022)   Exercise Vital Sign    Days of Exercise per Week: 0 days    Minutes of Exercise per Session: 0 min  Stress: Stress Concern Present (02/26/2022)   [place]    Feeling of Stress : Very much  Social Connections: Socially Integrated (02/26/2022)    Social Connection and Isolation Panel [NHANES]    Frequency of Communication with Friends and Family: More than three times a week    Frequency of Social Gatherings with Friends and Family: More than three times a week    Attends Religious Services: More than 4 times per year    Active Member of [employer] or Organizations: Yes    Attends Music therapist: More than 4 times per year    Marital Status: Married  Human resources officer Violence: Not At Risk (02/26/2022)   Humiliation, Afraid, Rape, and Kick questionnaire    Fear of Current or Ex-Partner: No    Emotionally Abused: No    Physically Abused: No    Sexually Abused: No    FAMILY HISTORY: Family History  Problem Relation Age of Onset   Alzheimer's disease Mother     ALLERGIES:  is allergic to penicillins and lisinopril.  MEDICATIONS:  Current Outpatient Medications  Medication Sig Dispense Refill   albuterol (VENTOLIN HFA) 108 (90 Base) MCG/ACT inhaler Inhale 2 puffs into the lungs every 6 (six) hours as needed for wheezing or shortness of breath. 18 g 5   aspirin 81 MG EC tablet Take 81 mg by mouth daily. Swallow whole.     diphenoxylate-atropine (LOMOTIL) 2.5-0.025 MG tablet Take 1 tablet by mouth 2 (two) times daily as needed for diarrhea or loose stools. 30 tablet 0   droxidopa (NORTHERA) 100 MG CAPS Take 1-2 capsules (100-200 mg total) by mouth 3 (three) times daily with meals. 180 capsule 2   feeding supplement, ENSURE COMPLETE, (ENSURE COMPLETE) LIQD Take 237 mLs by mouth 2 (two) times daily between meals.     finasteride (PROSCAR) 5 MG tablet Take 5 mg by mouth daily.     fludrocortisone (FLORINEF) 0.1 MG tablet Take 1 tablet (0.1 mg total) by mouth daily. 90 tablet 3   iron polysaccharides (NIFEREX) 150 MG capsule TAKE 1 CAPSULE(150 MG) BY MOUTH 3 TIMES A WEEK (Patient taking differently: Take 150 mg by mouth 3 (three) times a week.) 30 capsule 2   LANREOTIDE ACETATE [place] Inject 1 Dose into the skin every 30  (thirty) days.     loperamide (IMODIUM A-D) 2 MG capsule Take 2 mg by mouth daily as needed for diarrhea or loose stools.     midodrine (PROAMATINE) 5 MG tablet Take 3 tablets (15 mg total) by mouth 3 (three) times daily with meals. 270 tablet 5   mirtazapine (REMERON) 30 MG tablet Take 1 tablet (30 mg total) by mouth daily. 90 tablet 3   Multiple Vitamins-Minerals (PRESERVISION AREDS 2 PO) Take 1 capsule by mouth daily.     multivitamin (ONE-A-DAY MEN'S) TABS tablet Take 1 tablet by mouth daily.     Potassium Chloride ER 20 MEQ TBCR Take 1 tablet by mouth 2 (two) times daily. 60 tablet 5   SYMBICORT 160-4.5 MCG/ACT inhaler Inhale 2 puffs  into the lungs daily as needed (wheezing). 3 each 1   thiamine (VITAMIN B-1) 50 MG tablet Take 1 tablet (50 mg total) by mouth daily. 90 tablet 1   No current facility-administered medications for this visit.    REVIEW OF SYSTEMS:   10 Point review of Systems was done is negative except as noted above.  PHYSICAL EXAMINATION: ECOG PERFORMANCE STATUS: 3  . Vitals:   02/23/22 1417  BP: 112/72  Pulse: 67  Resp: 14  Temp: 98.1 F (36.7 C)  SpO2: 100%   Filed Weights   NAD GENERAL:alert, in no acute distress and comfortable SKIN: no acute rashes, no significant lesions EYES: conjunctiva are pink and non-injected, sclera anicteric OROPHARYNX: MMM, no exudates, no oropharyngeal erythema or ulceration NECK: supple, no JVD LYMPH:  no palpable lymphadenopathy in the cervical, axillary or inguinal regions LUNGS: clear to auscultation b/l with normal respiratory effort HEART: regular rate & rhythm ABDOMEN:  normoactive bowel sounds , non tender, not distended. Extremity: no pedal edema PSYCH: alert & oriented x 3 with fluent speech NEURO: no focal motor/sensory deficits    LABORATORY DATA:  I have reviewed the data as listed     Latest Ref Rng & Units 02/23/2022    2:05 PM 02/02/2022   12:20 PM 02/02/2022    3:00 AM  CBC  WBC 4.0 - 10.5 K/uL  8.4  9.3  7.2   Hemoglobin 13.0 - 17.0 g/dL 8.0  8.9  7.2   Hematocrit 39.0 - 52.0 % 22.2  26.0  20.7   Platelets 150 - 400 K/uL 221  145  121        Latest Ref Rng & Units 02/23/2022    2:05 PM 02/07/2022    9:46 AM 02/03/2022    8:51 AM  CMP  Glucose 70 - 99 mg/dL 128  143  130   BUN 8 - 23 mg/dL 50  58  51   Creatinine 0.61 - 1.24 mg/dL 2.81  3.15  2.71   Sodium 135 - 145 mmol/L 136  141  142   Potassium 3.5 - 5.1 mmol/L 3.5  5.1  4.0   Chloride 98 - 111 mmol/L 105  112  111   CO2 22 - 32 mmol/L '22  20  22   '$ Calcium 8.9 - 10.3 mg/dL 8.4  9.4  8.9   Total Protein 6.5 - 8.1 g/dL 6.3     Total Bilirubin 0.3 - 1.2 mg/dL 0.4     Alkaline Phos 38 - 126 U/L 50     AST 15 - 41 U/L 18     ALT 0 - 44 U/L 17       Lab Results  Component Value Date   IRON 35 (L) 02/23/2022   TIBC 189 (L) 02/23/2022   IRONPCTSAT 19 02/23/2022   (Iron and TIBC)  Lab Results  Component Value Date   FERRITIN 120 02/23/2022    RADIOGRAPHIC STUDIES:  I have personally reviewed the radiological images as listed and agreed with the findings in the report.  ASSESSMENT & PLAN:   81 year old male with  #1 Metastatic small bowel malignant carcinoid with chronic significant diarrhea and weight loss. Patient is currently on lanreotide every 4 weeks  #2 s/p  severe hypokalemia -now resolved after aggressive potassium replacement. Likely related to his decreased p.o. intake, chronic diarrhea and the use of Florinef.  #3 Iron deficiency anemia . Lab Results  Component Value Date   IRON 35 (L) 02/23/2022  TIBC 189 (L) 02/23/2022   IRONPCTSAT 19 02/23/2022   (Iron and TIBC)  Lab Results  Component Value Date   FERRITIN 120 02/23/2022   #4 acute on chronic renal insufficiency. #5 cancer and cancer treatment related anorexia with weight loss. #6 depression and anxiety Plan Labs done today were discussed in detail with the patient Chromogranin levels remain unchanged over the last 6 months No  notable toxicities from lanreotide We shall continue lanreotide every 4 weeks at this time and can increase the frequency to every 3 weeks if needed. -Continue loperamide and as needed Lomotil to control diarrhea -Continue follow-up with cardiology for management of orthostasis Patient not a candidate for Lutathera at this time due to concerns for worsening kidney function and poor functional status.  Follow-up -Continue lanreotide every 4 weeksx 6  -Return to clinic with Dr. Irene Limbo with labs in 2 weeks  The total time spent in the appointment was 25 minutes*.  All of the patient's questions were answered with apparent satisfaction. The patient knows to call the clinic with any problems, questions or concerns.   Sullivan Lone MD MS AAHIVMS [facility] Hematology/Oncology Physician [facility]  .*Total Encounter Time as defined by the Centers for Medicare and Medicaid Services includes, in addition to the face-to-face time of a patient visit (documented in the note above) non-face-to-face time: obtaining and reviewing outside history, ordering and reviewing medications, tests or procedures, care coordination (communications with other health care professionals or caregivers) and documentation in the medical record.

## 2022-03-03 ENCOUNTER — Inpatient Hospital Stay: Payer: Medicare Other

## 2022-03-03 ENCOUNTER — Encounter: Payer: Self-pay | Admitting: Internal Medicine

## 2022-03-03 DIAGNOSIS — E876 Hypokalemia: Secondary | ICD-10-CM | POA: Diagnosis not present

## 2022-03-03 DIAGNOSIS — D3A098 Benign carcinoid tumors of other sites: Secondary | ICD-10-CM | POA: Diagnosis not present

## 2022-03-03 DIAGNOSIS — D631 Anemia in chronic kidney disease: Secondary | ICD-10-CM | POA: Diagnosis not present

## 2022-03-03 DIAGNOSIS — K912 Postsurgical malabsorption, not elsewhere classified: Secondary | ICD-10-CM | POA: Diagnosis not present

## 2022-03-03 DIAGNOSIS — E559 Vitamin D deficiency, unspecified: Secondary | ICD-10-CM | POA: Diagnosis not present

## 2022-03-03 DIAGNOSIS — I1310 Hypertensive heart and chronic kidney disease without heart failure, with stage 1 through stage 4 chronic kidney disease, or unspecified chronic kidney disease: Secondary | ICD-10-CM | POA: Diagnosis not present

## 2022-03-03 DIAGNOSIS — E46 Unspecified protein-calorie malnutrition: Secondary | ICD-10-CM | POA: Diagnosis not present

## 2022-03-03 DIAGNOSIS — Q6102 Congenital multiple renal cysts: Secondary | ICD-10-CM | POA: Diagnosis not present

## 2022-03-03 DIAGNOSIS — I951 Orthostatic hypotension: Secondary | ICD-10-CM | POA: Diagnosis not present

## 2022-03-03 DIAGNOSIS — N2889 Other specified disorders of kidney and ureter: Secondary | ICD-10-CM | POA: Diagnosis not present

## 2022-03-03 DIAGNOSIS — N1832 Chronic kidney disease, stage 3b: Secondary | ICD-10-CM | POA: Diagnosis not present

## 2022-03-03 DIAGNOSIS — I129 Hypertensive chronic kidney disease with stage 1 through stage 4 chronic kidney disease, or unspecified chronic kidney disease: Secondary | ICD-10-CM | POA: Diagnosis not present

## 2022-03-03 DIAGNOSIS — N184 Chronic kidney disease, stage 4 (severe): Secondary | ICD-10-CM | POA: Diagnosis not present

## 2022-03-03 NOTE — Assessment & Plan Note (Signed)
Hydrate as before

## 2022-03-04 ENCOUNTER — Telehealth: Payer: Self-pay | Admitting: Hematology

## 2022-03-04 ENCOUNTER — Telehealth: Payer: Self-pay | Admitting: Internal Medicine

## 2022-03-04 ENCOUNTER — Ambulatory Visit: Payer: Medicare Other | Admitting: Internal Medicine

## 2022-03-04 ENCOUNTER — Inpatient Hospital Stay: Payer: Medicare Other

## 2022-03-04 ENCOUNTER — Ambulatory Visit: Payer: Self-pay | Admitting: Licensed Clinical Social Worker

## 2022-03-04 DIAGNOSIS — I951 Orthostatic hypotension: Secondary | ICD-10-CM | POA: Diagnosis not present

## 2022-03-04 DIAGNOSIS — D631 Anemia in chronic kidney disease: Secondary | ICD-10-CM | POA: Diagnosis not present

## 2022-03-04 DIAGNOSIS — N1832 Chronic kidney disease, stage 3b: Secondary | ICD-10-CM | POA: Diagnosis not present

## 2022-03-04 DIAGNOSIS — I1310 Hypertensive heart and chronic kidney disease without heart failure, with stage 1 through stage 4 chronic kidney disease, or unspecified chronic kidney disease: Secondary | ICD-10-CM | POA: Diagnosis not present

## 2022-03-04 NOTE — Patient Outreach (Signed)
  Care Coordination   Initial Visit Note   03/04/2022 Name: Alan [name] MRN: [id number] DOB: 1940/12/12  Alan [name] is a 81 y.o. year old male who sees [name], MD for primary care. I spoke with  Josie Saunders Mancha / spouse of client, [name] Bohorquez , by phone today.  What matters to the patients health and wellness today? Patient wants to continue to reside at home and complete daily ADLs with assistance as needed    Goals Addressed             This Visit's Progress    patient wants to continue to reside at home and complete daily ADLs with assistance as needed       Care Coordination Interventions:   Active listening / Reflection utilized  Informed [name] Blasco , spouse of client about Care Coordination program support Discussed client in home care needs. Client has [place] support and Home health Physical Therapy support.  Discussed medication procurement of client Reviewed appetite of client. [name] said client has reduced appetite. He drinks 2 Ensure supplements daily Discussed DME needs of client [name] spoke of client hospitalization and rehabilitation facility care for client [name] appreciative of phone call and will keep program support in mind if she feels that client could benefit from program in future months.          SDOH assessments and interventions completed:  Yes  SDOH Interventions Today    Flowsheet Row Most Recent Value  SDOH Interventions   Depression Interventions/Treatment  Medication, Currently on Treatment   Physical Activity.: client has walking challenges. He is currently being evaluated for physical therapy support in the home     Care Coordination Interventions Activated:  Yes  Care Coordination Interventions:  Yes, provided   Follow up plan: No further intervention required.   Encounter Outcome:  Pt. Visit Completed

## 2022-03-04 NOTE — Telephone Encounter (Signed)
Pharmacy needs clarification on pt's droxidopa (Aris[place]) 100 MG CAPS RX.   Please call: [phone]

## 2022-03-04 NOTE — Patient Instructions (Signed)
Visit Information  Thank you for taking time to visit with me today. Please don't hesitate to contact me if I can be of assistance to you before our next scheduled telephone appointment.  Following are the goals we discussed today:   No further intervention needed at this time  Please call the care guide team at [phone] if you need to cancel or reschedule your appointment.   If you are experiencing a Mental Health or [place] or need someone to talk to, please go to [facility] [place] [phone])   Following is a copy of your full plan of care:   Care Coordination Interventions:  Active listening / Reflection utilized  Informed [name] Dross , spouse of client about Care Coordination program support Discussed client in home care needs. Client has [place] support and Home health Physical Therapy support.  Discussed medication procurement of client Reviewed appetite of client. [name] said client has reduced appetite. He drinks 2 Ensure supplements daily Discussed DME needs of client [name] spoke of client hospitalization and rehabilitation facility care for client [name] appreciative of phone call and will keep program support in mind if she feels that client could benefit from program in future months.    Mr. Brassfield was given information about Care Management services by the embedded care coordination team including:  Care Management services include personalized support from designated clinical staff supervised by his physician, including individualized plan of care and coordination with other care providers 24/7 contact phone numbers for assistance for urgent and routine care needs. The patient may stop CCM services at any time (effective at the end of the month) by phone call to the office staff.  Patient agreed to services and verbal consent obtained.   Norva Riffle.Olivya Sobol MSW,  [place] Holiday representative [facility] Care Management [phone]

## 2022-03-04 NOTE — Telephone Encounter (Signed)
Scheduled per 9/6 in basket, message has been left

## 2022-03-05 ENCOUNTER — Ambulatory Visit: Payer: Self-pay

## 2022-03-05 ENCOUNTER — Telehealth: Payer: Self-pay

## 2022-03-05 ENCOUNTER — Other Ambulatory Visit: Payer: Self-pay | Admitting: Internal Medicine

## 2022-03-05 ENCOUNTER — Encounter: Payer: Self-pay | Admitting: Internal Medicine

## 2022-03-05 ENCOUNTER — Other Ambulatory Visit (HOSPITAL_COMMUNITY): Payer: Medicare Other

## 2022-03-05 DIAGNOSIS — N1832 Chronic kidney disease, stage 3b: Secondary | ICD-10-CM | POA: Diagnosis not present

## 2022-03-05 DIAGNOSIS — D631 Anemia in chronic kidney disease: Secondary | ICD-10-CM | POA: Diagnosis not present

## 2022-03-05 DIAGNOSIS — I951 Orthostatic hypotension: Secondary | ICD-10-CM

## 2022-03-05 DIAGNOSIS — I1310 Hypertensive heart and chronic kidney disease without heart failure, with stage 1 through stage 4 chronic kidney disease, or unspecified chronic kidney disease: Secondary | ICD-10-CM | POA: Diagnosis not present

## 2022-03-05 DIAGNOSIS — R55 Syncope and collapse: Secondary | ICD-10-CM

## 2022-03-05 MED ORDER — DROXIDOPA 100 MG PO CAPS: 100.0000 mg | ORAL_CAPSULE | Freq: Three times a day (TID) | ORAL | 0 refills | Status: DC

## 2022-03-05 NOTE — Telephone Encounter (Signed)
Verbal orders given for PT. 

## 2022-03-05 NOTE — Patient Outreach (Signed)
  Care Coordination   Follow Up Visit Note   03/05/2022 Name: Alan [name] MRN: [id number] DOB: May 21, 1941  Alan [name] is a 81 y.o. year old male who sees Alan Lima, MD for primary care. I spoke with  Alan [name] by phone today.  What matters to the patients health and wellness today?  Recent diagnosis of UTI. Patient not eating well. Home health agency has started care. Continues to have difficulty with obtaining medication Droxidopa. Alan [name] to contact pharmacy today to see if they are able to fill prescription.    Goals Addressed             This Visit's Progress    Care Coordination Post Rehab       Care Coordination Interventions: Advised patient to contact PCP if condition worsens or does not improve Encouraged to take all antibiotics until gone Encouraged to continue to provide small meals. Discussed foods high in calories.  Encouraged to contact provider if continues to have decreased appetite and keep track of weights. Reviewed upcoming appointments Message sent to upstream pharmacy to assist as needed Encouraged to follow up with local pharmacy to see if prescription has been received and contact RN Care Coordinator if needed          SDOH assessments and interventions completed:  No   Care Coordination Interventions Activated:  Yes  Care Coordination Interventions:  Yes, provided   Follow up plan: Follow up call scheduled for 03/19/22    Encounter Outcome:  Pt. Visit Completed   Thea Silversmith, RN, MSN, BSN, [place] Coordinator [phone]

## 2022-03-05 NOTE — Telephone Encounter (Signed)
Carita Pian is requesting VO for continuation of PT: 1 time a week for 1 week 2 times a week for 4 weeks  Please call [place] at  [phone]

## 2022-03-06 ENCOUNTER — Inpatient Hospital Stay: Payer: Medicare Other

## 2022-03-09 DIAGNOSIS — I1310 Hypertensive heart and chronic kidney disease without heart failure, with stage 1 through stage 4 chronic kidney disease, or unspecified chronic kidney disease: Secondary | ICD-10-CM | POA: Diagnosis not present

## 2022-03-09 DIAGNOSIS — N1832 Chronic kidney disease, stage 3b: Secondary | ICD-10-CM | POA: Diagnosis not present

## 2022-03-09 DIAGNOSIS — I951 Orthostatic hypotension: Secondary | ICD-10-CM | POA: Diagnosis not present

## 2022-03-09 DIAGNOSIS — D631 Anemia in chronic kidney disease: Secondary | ICD-10-CM | POA: Diagnosis not present

## 2022-03-10 DIAGNOSIS — N1832 Chronic kidney disease, stage 3b: Secondary | ICD-10-CM | POA: Diagnosis not present

## 2022-03-10 DIAGNOSIS — I1310 Hypertensive heart and chronic kidney disease without heart failure, with stage 1 through stage 4 chronic kidney disease, or unspecified chronic kidney disease: Secondary | ICD-10-CM | POA: Diagnosis not present

## 2022-03-10 DIAGNOSIS — I951 Orthostatic hypotension: Secondary | ICD-10-CM | POA: Diagnosis not present

## 2022-03-10 DIAGNOSIS — D631 Anemia in chronic kidney disease: Secondary | ICD-10-CM | POA: Diagnosis not present

## 2022-03-11 ENCOUNTER — Encounter: Payer: Self-pay | Admitting: Internal Medicine

## 2022-03-11 DIAGNOSIS — I951 Orthostatic hypotension: Secondary | ICD-10-CM | POA: Diagnosis not present

## 2022-03-11 DIAGNOSIS — I1310 Hypertensive heart and chronic kidney disease without heart failure, with stage 1 through stage 4 chronic kidney disease, or unspecified chronic kidney disease: Secondary | ICD-10-CM | POA: Diagnosis not present

## 2022-03-11 DIAGNOSIS — D631 Anemia in chronic kidney disease: Secondary | ICD-10-CM | POA: Diagnosis not present

## 2022-03-11 DIAGNOSIS — N1832 Chronic kidney disease, stage 3b: Secondary | ICD-10-CM | POA: Diagnosis not present

## 2022-03-11 NOTE — Telephone Encounter (Signed)
Per CoverMyMeds:  PA was denied. Reason given:  DROXIDOPA CAP '100MG'$  is not FDA approved or supported by an accepted reference for your medical condition(s): Symptomatic neurogenic orthostatic hypotension. In order to approve your medication, you must also have neurogenic orthostatic hypotension caused by dopamine beta-hydroxylase deficiency, non-diabetic autonomic neuropathy, or primary autonomic failure (Parkinson's disease, multiple system atrophy, or pure autonomic failure). Therefore, your drug is denied because it is not being used for a "medically accepted indication."

## 2022-03-11 NOTE — Telephone Encounter (Signed)
New noted not needed

## 2022-03-11 NOTE — Telephone Encounter (Signed)
Alan [name] has been informed to proceed with urine check. I stated that PA was denied by insurance and that I am waiting to be advised from PCP for the next step. She expressed understanding.

## 2022-03-11 NOTE — Telephone Encounter (Signed)
Alan [name] with [facility] calls today in regards to PT's change in behavior. PT is currently experiencing an increase of confusion, increase in frequency to urinate, and decreased appetite (and intake of fluids). Alan [name] is wanting to know if they should do another urinalysis or is this to be expected of PT's condition now. If the urinalysis is suggested they can go ahead and get orders in for it now, they would just require a verbal order.   CB: [phone]  They also are inquiring about PT's droxidopa [facility]), they have still not received this RX and the insurance company has let them know that it had not been put in.  (I did let them know that it looks like, at least from my end here, that the RX was put in on 09/07)

## 2022-03-11 NOTE — Telephone Encounter (Signed)
Angel from [facility] called again and states the ins will approve the DROXIDOPA CAP '100MG'$  RX with additional DX codes. Please call Glenard Haring to for more information about which codes are needed.   Angel: [phone]

## 2022-03-11 NOTE — Telephone Encounter (Signed)
Droxidopa Key: [id number]

## 2022-03-12 ENCOUNTER — Other Ambulatory Visit: Payer: Self-pay | Admitting: Internal Medicine

## 2022-03-13 ENCOUNTER — Other Ambulatory Visit: Payer: Self-pay

## 2022-03-13 ENCOUNTER — Other Ambulatory Visit: Payer: Self-pay | Admitting: Internal Medicine

## 2022-03-13 DIAGNOSIS — I1310 Hypertensive heart and chronic kidney disease without heart failure, with stage 1 through stage 4 chronic kidney disease, or unspecified chronic kidney disease: Secondary | ICD-10-CM | POA: Diagnosis not present

## 2022-03-13 DIAGNOSIS — N1832 Chronic kidney disease, stage 3b: Secondary | ICD-10-CM | POA: Diagnosis not present

## 2022-03-13 DIAGNOSIS — I951 Orthostatic hypotension: Secondary | ICD-10-CM | POA: Diagnosis not present

## 2022-03-13 DIAGNOSIS — D631 Anemia in chronic kidney disease: Secondary | ICD-10-CM | POA: Diagnosis not present

## 2022-03-13 DIAGNOSIS — G903 Multi-system degeneration of the autonomic nervous system: Secondary | ICD-10-CM

## 2022-03-13 DIAGNOSIS — R55 Syncope and collapse: Secondary | ICD-10-CM

## 2022-03-13 MED ORDER — FLUDROCORTISONE ACETATE 0.1 MG PO TABS: 0.1000 mg | ORAL_TABLET | Freq: Every day | ORAL | 2 refills | Status: AC

## 2022-03-13 MED ORDER — DROXIDOPA 100 MG PO CAPS: 100.0000 mg | ORAL_CAPSULE | Freq: Three times a day (TID) | ORAL | 0 refills | Status: DC

## 2022-03-15 DIAGNOSIS — I495 Sick sinus syndrome: Secondary | ICD-10-CM

## 2022-03-15 DIAGNOSIS — D509 Iron deficiency anemia, unspecified: Secondary | ICD-10-CM | POA: Diagnosis not present

## 2022-03-15 DIAGNOSIS — K912 Postsurgical malabsorption, not elsewhere classified: Secondary | ICD-10-CM | POA: Diagnosis not present

## 2022-03-15 DIAGNOSIS — Z87891 Personal history of nicotine dependence: Secondary | ICD-10-CM

## 2022-03-15 DIAGNOSIS — Z7951 Long term (current) use of inhaled steroids: Secondary | ICD-10-CM

## 2022-03-15 DIAGNOSIS — F339 Major depressive disorder, recurrent, unspecified: Secondary | ICD-10-CM

## 2022-03-15 DIAGNOSIS — E43 Unspecified severe protein-calorie malnutrition: Secondary | ICD-10-CM | POA: Diagnosis not present

## 2022-03-15 DIAGNOSIS — K529 Noninfective gastroenteritis and colitis, unspecified: Secondary | ICD-10-CM

## 2022-03-15 DIAGNOSIS — I1310 Hypertensive heart and chronic kidney disease without heart failure, with stage 1 through stage 4 chronic kidney disease, or unspecified chronic kidney disease: Secondary | ICD-10-CM | POA: Diagnosis not present

## 2022-03-15 DIAGNOSIS — Z7952 Long term (current) use of systemic steroids: Secondary | ICD-10-CM

## 2022-03-15 DIAGNOSIS — D630 Anemia in neoplastic disease: Secondary | ICD-10-CM

## 2022-03-15 DIAGNOSIS — F419 Anxiety disorder, unspecified: Secondary | ICD-10-CM

## 2022-03-15 DIAGNOSIS — I951 Orthostatic hypotension: Secondary | ICD-10-CM | POA: Diagnosis not present

## 2022-03-15 DIAGNOSIS — D631 Anemia in chronic kidney disease: Secondary | ICD-10-CM | POA: Diagnosis not present

## 2022-03-15 DIAGNOSIS — C7A019 Malignant carcinoid tumor of the small intestine, unspecified portion: Secondary | ICD-10-CM

## 2022-03-15 DIAGNOSIS — I480 Paroxysmal atrial fibrillation: Secondary | ICD-10-CM | POA: Diagnosis not present

## 2022-03-15 DIAGNOSIS — J45909 Unspecified asthma, uncomplicated: Secondary | ICD-10-CM | POA: Diagnosis not present

## 2022-03-15 DIAGNOSIS — R1312 Dysphagia, oropharyngeal phase: Secondary | ICD-10-CM | POA: Diagnosis not present

## 2022-03-15 DIAGNOSIS — Z86711 Personal history of pulmonary embolism: Secondary | ICD-10-CM

## 2022-03-15 DIAGNOSIS — Z9181 History of falling: Secondary | ICD-10-CM

## 2022-03-15 DIAGNOSIS — N1832 Chronic kidney disease, stage 3b: Secondary | ICD-10-CM | POA: Diagnosis not present

## 2022-03-15 DIAGNOSIS — Z950 Presence of cardiac pacemaker: Secondary | ICD-10-CM

## 2022-03-16 ENCOUNTER — Telehealth: Payer: Medicare Other | Admitting: Internal Medicine

## 2022-03-16 DIAGNOSIS — D631 Anemia in chronic kidney disease: Secondary | ICD-10-CM | POA: Diagnosis not present

## 2022-03-16 DIAGNOSIS — N184 Chronic kidney disease, stage 4 (severe): Secondary | ICD-10-CM | POA: Diagnosis not present

## 2022-03-16 DIAGNOSIS — Z515 Encounter for palliative care: Secondary | ICD-10-CM

## 2022-03-16 DIAGNOSIS — E43 Unspecified severe protein-calorie malnutrition: Secondary | ICD-10-CM | POA: Diagnosis not present

## 2022-03-16 DIAGNOSIS — G903 Multi-system degeneration of the autonomic nervous system: Secondary | ICD-10-CM | POA: Diagnosis not present

## 2022-03-16 DIAGNOSIS — I951 Orthostatic hypotension: Secondary | ICD-10-CM | POA: Diagnosis not present

## 2022-03-16 DIAGNOSIS — N1832 Chronic kidney disease, stage 3b: Secondary | ICD-10-CM | POA: Diagnosis not present

## 2022-03-16 DIAGNOSIS — C7A019 Malignant carcinoid tumor of the small intestine, unspecified portion: Secondary | ICD-10-CM

## 2022-03-16 DIAGNOSIS — F329 Major depressive disorder, single episode, unspecified: Secondary | ICD-10-CM

## 2022-03-16 DIAGNOSIS — I1310 Hypertensive heart and chronic kidney disease without heart failure, with stage 1 through stage 4 chronic kidney disease, or unspecified chronic kidney disease: Secondary | ICD-10-CM | POA: Diagnosis not present

## 2022-03-16 MED ORDER — MIRTAZAPINE 30 MG PO TABS: 30.0000 mg | ORAL_TABLET | Freq: Every day | ORAL | 3 refills | Status: DC

## 2022-03-16 NOTE — Progress Notes (Signed)
[facility] Palliative Care Follow-Up Visit Telephone: [phone]  Fax: [phone]   Date of encounter: 03/16/22 2:31 PM PATIENT NAME: Alan [name] [id number]   [phone] (home)  DOB: 12-13-1940 MRN: [id number] PRIMARY CARE PROVIDER:    [name], MD,  [place] [address] [phone]  REFERRING PROVIDER:   [name], MD [address] [place],  [place] [address] [phone]  RESPONSIBLE PARTY:    Contact Information     Name Relation Home Work Mobile   Alan [name] Spouse [phone]  [phone]   [place] Alan [name] [phone]  [phone]       Due to the COVID-19 crisis, this visit was done via telemedicine from my office and it was initiated and consent by this patient and or family.  I connected with  Alan [name] OR PROXY on 03/25/22 by a video enabled telemedicine application and verified that I am speaking with the correct person using two identifiers.   I discussed the limitations of evaluation and management by telemedicine. The patient expressed understanding and agreed to proceed.   Palliative Care was asked to follow this patient by consultation request of  Alan Lima, MD to address advance care planning and complex medical decision making. This is follow-up visit.                                     ASSESSMENT AND PLAN / RECOMMENDATIONS:   Advance Care Planning/Goals of Care: Goals include to maximize quality of life and symptom management. Patient/health care surrogate gave his/her permission to discuss.Our advance care planning conversation included a discussion about:    The value and importance of advance care planning  Experiences with loved ones who have been seriously ill or have died  Exploration of personal, cultural or spiritual beliefs that might influence medical decisions  Exploration of goals of care in the event of a  sudden injury or illness  Identification  of a healthcare agent  Review and updating or creation of an  advance directive document . Decision not to resuscitate or to de-escalate disease focused treatments due to poor prognosis. CODE STATUS:  full code, patient not yet ready to stop txs for his carcinoid to entertain hospice  Symptom Management/Plan: 1. Major depressive disorder, remission status unspecified, unspecified whether recurrent Out of mirtazapine I'd ordered so renewed, was helping him per his wife - mirtazapine (REMERON) 30 MG tablet; Take 1 tablet (30 mg total) by mouth daily.  Dispense: 90 tablet; Refill: 3  2. Malignant carcinoid tumor of small intestine, unspecified location [facility]) -remains primary underlying condition leading to his decline -continues on lanreotide txs and not yet ready to stop them -will f/u with him in home to discuss further  3. Neurogenic orthostatic hypotension (HCC) -benefited greatly from addition of droxidopa therapy in hospital and at [facility] where his syncopal episodes were not an issue, he's been back home and insurance did not cover so he's back to having episodes of severe lightheadedness and syncope spells since -family purchased for an exorbitant cost a short-term supply of the medication and since he's taken it, he's not passed out--PCP has been trying to get this covered for him--proper diagnosis is listed so not clear why it's not being covered   4. Protein-calorie malnutrition, severe -remains a concern, intake poor, last weight 130 lbs 9/1  down from 148 lbs about a year prior in late august 2022 -diarrhea from carcinoid also impacts  5. CKD (chronic kidney disease), stage IV (HCC) -high risk for acute decline with his poor intake and losses through bowel -not a candidate for hemodialysis with his other comorbidities nor would he tolerate it  6. Palliative care by specialist -pt was incredibly drowsy for visit this time and I mostly  spoke with his wife and primary caregiver -will f/u with them in person to readdress goals of care, MOST and possible hospice     Follow up Palliative Care Visit: Palliative care will continue to follow for complex medical decision making, advance care planning, and clarification of goals. Return 03/31/2022 and prn.   This visit was coded based on medical decision making (MDM).  PPS: 40%  HOSPICE ELIGIBILITY/DIAGNOSIS: TBD  Chief Complaint: Follow-up palliative visit  HISTORY OF PRESENT ILLNESS:  Alan [name] is a 81 y.o. year old male  with malignant carcinoid tumor on tx, neurogenic orthostatic hypotension, anxiety, depression, protein calorie malnutrition with weight loss, among others seen via mychart video in palliative f/u along with his wife .   He continued to have the orthostatic hypotension and passing out and the cancer.  He passed out when his wife was not there.  They had him transported to hospital where he was for almost 2 wks.  He kept passing out and couldn't get released from hospital.  They did put him on droxidopa.  He had it at the hospital and rehab.  Couldn't get it from 9/1 to 9/27.  Mrs said they wouldn't cover it based on what doctor documented as cause.  So he's back on it for 2 days.  She wants to appeal the $3000.  She sees a little improvement on it.  His BP is up but it does still drop when he stands.  143 down to 322 systolic with standing with therapy.    He still has anxiety at night.  He's out of the mirtazapine '30mg'$ .    He sleeps a lot.    Alan [name] came to do exercises with him today.  He's not eating like he should.  He had breakfast and ensure bid the past two days.  Ge had only 3 chicken wings for lunch.    Having some fecal incontinence.    He just got over a UTI treated through urology.  Not clear if resolved.   He's up all night long in the bathroom--he's prideful about it and not wanting to use depends overnight.  He doesn't want to go to the  bathroom Sometimes reports pain.    Mrs has hired some CNAs to help out so she can leave the house.      80 yo helps if he falls.  He has all of the equipment except a hospital bed--he's in a lower guest bed now.    She's trying to keep him hydrated.    Urologist said mass on kidney was very small and no intervention was needed--he did not feel it was cancerous.  If it grows in three months, it might need [place] or biopsy.  Renal function remains poor.    History obtained from review of EMR, discussion with primary team, and interview with family, facility staff/caregiver and/or Mr. Garry.   I reviewed available labs, medications, imaging, studies and related documents from the EMR.  Records reviewed and summarized above.   ROS Review of Systemssee hpi  Physical Exam:  Wt Readings  from Last 500 Encounters:  02/27/22 130 lb (59 kg)  02/26/22 134 lb (60.8 kg)  02/09/22 130 lb 1.1 oz (59 kg)  01/29/22 133 lb 6 oz (60.5 kg)  01/28/22 133 lb 6 oz (60.5 kg)  01/09/22 136 lb 3.2 oz (61.8 kg)  12/04/21 136 lb (61.7 kg)  11/25/21 141 lb 1.6 oz (64 kg)  11/10/21 144 lb (65.3 kg)  11/04/21 142 lb (64.4 kg)  10/24/21 124 lb 1.9 oz (56.3 kg)  10/13/21 136 lb 8 oz (61.9 kg)  09/01/21 139 lb (63 kg)  08/15/21 138 lb 12.8 oz (63 kg)  07/28/21 139 lb 3.2 oz (63.1 kg)  06/26/21 141 lb (64 kg)  06/11/21 139 lb 3.2 oz (63.1 kg)  06/09/21 142 lb (64.4 kg)  04/23/21 141 lb 4.8 oz (64.1 kg)  04/21/21 143 lb 12.8 oz (65.2 kg)  02/25/21 148 lb 6.4 oz (67.3 kg)  02/20/21 147 lb 9.6 oz (67 kg)  02/19/21 145 lb 9.6 oz (66 kg)  01/22/21 156 lb 12 oz (71.1 kg)  01/17/21 149 lb 3.2 oz (67.7 kg)  01/21/11 173 lb (78.5 kg)   Physical Exam Constitutional:      Appearance: He is ill-appearing.     Comments: Frail appearing, drowsy male seated on couch  HENT:     Head: Normocephalic and atraumatic.  Pulmonary:     Effort: Pulmonary effort is normal.  Abdominal:     General: There is no distension.   Musculoskeletal:        General: Normal range of motion.     Right lower leg: No edema.     Left lower leg: No edema.  Neurological:     Comments: lethargic  Psychiatric:     Comments: Flat affect     CURRENT PROBLEM LIST:  Patient Active Problem List   Diagnosis Date Noted   Neurogenic orthostatic hypotension ([place]) 03/13/2022   Protein-calorie malnutrition, severe 02/05/2022   CKD (chronic kidney disease), stage IV ([place]) 02/02/2022   Acute on chronic anemia 02/02/2022   Syncope 02/01/2022   Paroxysmal atrial fibrillation ([place]) 01/31/2022   History of pulmonary embolism 01/31/2022   Hypotension 01/31/2022   Acute kidney injury superimposed on chronic kidney disease ([place]) 01/28/2022   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   Back abscess 01/28/2022   Anemia due to acquired thiamine deficiency 12/10/2021   Deficiency anemia 12/04/2021   Oropharyngeal dysphagia 06/27/2021   Short gut syndrome    Major depressive disorder 04/22/2021   Sinus node dysfunction ([place]) 04/22/2021   Atrial fibrillation with rapid ventricular response ([place]) 04/22/2021   AV block, 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   Orthostatic hypotension 02/20/2021   Iron deficiency anemia due to chronic blood loss 02/20/2021   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[place]) 01/18/2021   GIB (gastrointestinal bleeding) 01/18/2021   Essential hypertension 01/18/2021    PAST MEDICAL HISTORY:  Active Ambulatory Problems    Diagnosis Date Noted   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[place]) 01/18/2021   GIB (gastrointestinal bleeding) 01/18/2021   Essential hypertension 01/18/2021   Orthostatic hypotension 02/20/2021   Iron deficiency anemia due to chronic blood loss 02/20/2021   AV block, 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   Major depressive disorder 04/22/2021   Sinus node dysfunction ([place]) 04/22/2021   Atrial fibrillation with rapid ventricular response ([place]) 04/22/2021   Short gut syndrome    Oropharyngeal dysphagia  06/27/2021   Deficiency anemia 12/04/2021   Anemia due to acquired thiamine deficiency 12/10/2021   Acute kidney  injury superimposed on chronic kidney disease ([place]) 01/28/2022   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   Back abscess 01/28/2022   Paroxysmal atrial fibrillation ([place]) 01/31/2022   History of pulmonary embolism 01/31/2022   Hypotension 01/31/2022   Syncope 02/01/2022   CKD (chronic kidney disease), stage IV ([place]) 02/02/2022   Acute on chronic anemia 02/02/2022   Protein-calorie malnutrition, severe 02/05/2022   Neurogenic orthostatic hypotension ([place]) 03/13/2022   Resolved Ambulatory Problems    Diagnosis Date Noted   Acute pulmonary embolism ([place]) 01/16/2021   Near syncope 01/18/2021   Bradycardia 01/18/2021   Hypokalemia due to excessive gastrointestinal loss of potassium 01/18/2021   Chronic kidney disease, stage 3a ([place]) 01/18/2021   Deficiency anemia 02/19/2021   Weight loss, abnormal 02/19/2021   Productive cough 02/19/2021   Acute bronchitis due to 2019 novel coronavirus 02/20/2021   Syncope 04/22/2021   Elevated troponin level not due myocardial infarction 04/22/2021   Acute renal failure superimposed on stage 3b chronic kidney disease ([place]) 10/19/2021   Skin tear of left forearm without complication 82/42/3536   Past Medical History:  Diagnosis Date   Asthma    Carcinoid tumor, small intestine, malignant (HCC)    Chronic diarrhea    CKD (chronic kidney disease) stage 3, GFR 30-59 ml/min (HCC)    HTN (hypertension)     SOCIAL HX:  Social History   Tobacco Use   Smoking status: Some Days    Packs/day: 0.30    Years: 61.00    Total pack years: 18.30    Types: Cigars, Cigarettes    Start date: 29    Last attempt to quit: 03/03/2020    Years since quitting: 2.0   Smokeless tobacco: Never   Tobacco comments:    Former cig   Substance Use Topics   Alcohol use: Not Currently     ALLERGIES:  Allergies  Allergen Reactions   Penicillins  Hives and Shortness Of Breath   Lisinopril Itching      PERTINENT MEDICATIONS:  Outpatient Encounter Medications as of 03/16/2022  Medication Sig   albuterol (VENTOLIN HFA) 108 (90 Base) MCG/ACT inhaler Inhale 2 puffs into the lungs every 6 (six) hours as needed for wheezing or shortness of breath.   aspirin 81 MG EC tablet Take 81 mg by mouth daily. Swallow whole.   diphenoxylate-atropine (LOMOTIL) 2.5-0.025 MG tablet Take 1 tablet by mouth 2 (two) times daily as needed for diarrhea or loose stools.   droxidopa (NORTHERA) 100 MG CAPS Take 1 capsule (100 mg total) by mouth 3 (three) times daily with meals.   feeding supplement, ENSURE COMPLETE, (ENSURE COMPLETE) LIQD Take 237 mLs by mouth 2 (two) times daily between meals.   finasteride (PROSCAR) 5 MG tablet Take 5 mg by mouth daily.   fludrocortisone (FLORINEF) 0.1 MG tablet Take 1 tablet (0.1 mg total) by mouth daily.   iron polysaccharides (NIFEREX) 150 MG capsule TAKE 1 CAPSULE(150 MG) BY MOUTH 3 TIMES A WEEK (Patient taking differently: Take 150 mg by mouth 3 (three) times a week.)   LANREOTIDE ACETATE [place] Inject 1 Dose into the skin every 30 (thirty) days.   loperamide (IMODIUM A-D) 2 MG capsule Take 2 mg by mouth daily as needed for diarrhea or loose stools.   mirtazapine (REMERON) 30 MG tablet Take 1 tablet (30 mg total) by mouth daily.   Multiple Vitamins-Minerals (PRESERVISION AREDS 2 PO) Take 1 capsule by mouth daily.   multivitamin (ONE-A-DAY MEN'S) TABS tablet Take 1 tablet by mouth daily.  Potassium Chloride ER 20 MEQ TBCR Take 1 tablet by mouth 2 (two) times daily.   SULFAMETHOXAZOLE-TRIMETHOPRIM PO Take 1 tablet by mouth 2 (two) times daily.   SYMBICORT 160-4.5 MCG/ACT inhaler Inhale 2 puffs into the lungs daily as needed (wheezing).   thiamine (VITAMIN B-1) 50 MG tablet Take 1 tablet (50 mg total) by mouth daily.   No facility-administered encounter medications on file as of 03/16/2022.    Thank you for the opportunity to  participate in the care of Mr. Hashimi.  The palliative care team will continue to follow. Please call our office at [phone] if we can be of additional assistance.   Hollace Kinnier, DO  COVID-19 PATIENT SCREENING TOOL Asked and negative response unless otherwise noted:  Have you had symptoms of covid, tested positive or been in contact with someone with symptoms/positive test in the past 5-10 days? No

## 2022-03-17 DIAGNOSIS — I1310 Hypertensive heart and chronic kidney disease without heart failure, with stage 1 through stage 4 chronic kidney disease, or unspecified chronic kidney disease: Secondary | ICD-10-CM | POA: Diagnosis not present

## 2022-03-17 DIAGNOSIS — I951 Orthostatic hypotension: Secondary | ICD-10-CM | POA: Diagnosis not present

## 2022-03-17 DIAGNOSIS — N1832 Chronic kidney disease, stage 3b: Secondary | ICD-10-CM | POA: Diagnosis not present

## 2022-03-17 DIAGNOSIS — D631 Anemia in chronic kidney disease: Secondary | ICD-10-CM | POA: Diagnosis not present

## 2022-03-18 ENCOUNTER — Encounter: Payer: Self-pay | Admitting: Internal Medicine

## 2022-03-18 DIAGNOSIS — N1832 Chronic kidney disease, stage 3b: Secondary | ICD-10-CM | POA: Diagnosis not present

## 2022-03-18 DIAGNOSIS — I1310 Hypertensive heart and chronic kidney disease without heart failure, with stage 1 through stage 4 chronic kidney disease, or unspecified chronic kidney disease: Secondary | ICD-10-CM | POA: Diagnosis not present

## 2022-03-18 DIAGNOSIS — D631 Anemia in chronic kidney disease: Secondary | ICD-10-CM | POA: Diagnosis not present

## 2022-03-18 DIAGNOSIS — I951 Orthostatic hypotension: Secondary | ICD-10-CM | POA: Diagnosis not present

## 2022-03-19 ENCOUNTER — Telehealth: Payer: Self-pay

## 2022-03-19 NOTE — Patient Outreach (Signed)
  Care Coordination   03/19/2022 Name: Alan [name] MRN: [id number] DOB: 20-Oct-1940   Care Coordination Outreach Attempts:  An unsuccessful telephone outreach was attempted for a scheduled appointment today.  Follow Up Plan:  Additional outreach attempts will be made to offer the patient care coordination information and services.   Encounter Outcome:  No Answer  Care Coordination Interventions Activated:  No   Care Coordination Interventions:  No, not indicated    Thea Silversmith, RN, MSN, BSN, [place] Coordinator [phone]

## 2022-03-20 ENCOUNTER — Other Ambulatory Visit: Payer: Self-pay

## 2022-03-20 DIAGNOSIS — C7A019 Malignant carcinoid tumor of the small intestine, unspecified portion: Secondary | ICD-10-CM

## 2022-03-23 ENCOUNTER — Inpatient Hospital Stay: Payer: Medicare Other

## 2022-03-23 ENCOUNTER — Other Ambulatory Visit: Payer: Self-pay

## 2022-03-23 ENCOUNTER — Inpatient Hospital Stay: Payer: Medicare Other | Attending: Hematology

## 2022-03-23 ENCOUNTER — Inpatient Hospital Stay: Payer: Medicare Other | Admitting: Hematology

## 2022-03-23 VITALS — BP 101/68 | HR 84 | Temp 97.9°F | Resp 16 | Ht 73.0 in | Wt 127.4 lb

## 2022-03-23 DIAGNOSIS — C7A019 Malignant carcinoid tumor of the small intestine, unspecified portion: Secondary | ICD-10-CM | POA: Insufficient documentation

## 2022-03-23 LAB — CMP (CANCER CENTER ONLY)
ALT: 21 U/L (ref 0–44)
AST: 14 U/L — ABNORMAL LOW (ref 15–41)
Albumin: 4 g/dL (ref 3.5–5.0)
Alkaline Phosphatase: 53 U/L (ref 38–126)
Anion gap: 8 (ref 5–15)
BUN: 56 mg/dL — ABNORMAL HIGH (ref 8–23)
CO2: 19 mmol/L — ABNORMAL LOW (ref 22–32)
Calcium: 8.9 mg/dL (ref 8.9–10.3)
Chloride: 112 mmol/L — ABNORMAL HIGH (ref 98–111)
Creatinine: 2.9 mg/dL — ABNORMAL HIGH (ref 0.61–1.24)
GFR, Estimated: 21 mL/min — ABNORMAL LOW (ref >60)
Glucose, Bld: 108 mg/dL — ABNORMAL HIGH (ref 70–99)
Potassium: 4.5 mmol/L (ref 3.5–5.1)
Sodium: 139 mmol/L (ref 135–145)
Total Bilirubin: 0.4 mg/dL (ref 0.3–1.2)
Total Protein: 7.3 g/dL (ref 6.5–8.1)

## 2022-03-23 LAB — CBC WITH DIFFERENTIAL (CANCER CENTER ONLY)
Abs Immature Granulocytes: 0.01 10*3/uL (ref 0.00–0.07)
Basophils Absolute: 0 10*3/uL (ref 0.0–0.1)
Basophils Relative: 0 %
Eosinophils Absolute: 0 10*3/uL (ref 0.0–0.5)
Eosinophils Relative: 1 %
HCT: 29.7 % — ABNORMAL LOW (ref 39.0–52.0)
Hemoglobin: 10.3 g/dL — ABNORMAL LOW (ref 13.0–17.0)
Immature Granulocytes: 0 %
Lymphocytes Relative: 24 %
Lymphs Abs: 1.4 10*3/uL (ref 0.7–4.0)
MCH: 31.5 pg (ref 26.0–34.0)
MCHC: 34.7 g/dL (ref 30.0–36.0)
MCV: 90.8 fL (ref 80.0–100.0)
Monocytes Absolute: 0.6 10*3/uL (ref 0.1–1.0)
Monocytes Relative: 9 %
Neutro Abs: 4 10*3/uL (ref 1.7–7.7)
Neutrophils Relative %: 66 %
Platelet Count: 171 10*3/uL (ref 150–400)
RBC: 3.27 MIL/uL — ABNORMAL LOW (ref 4.22–5.81)
RDW: 14.7 % (ref 11.5–15.5)
WBC Count: 6.1 10*3/uL (ref 4.0–10.5)
nRBC: 0 % (ref 0.0–0.2)

## 2022-03-23 LAB — IRON AND IRON BINDING CAPACITY (CC-WL,HP ONLY)
Iron: 48 ug/dL (ref 45–182)
Saturation Ratios: 21 % (ref 17.9–39.5)
TIBC: 232 ug/dL — ABNORMAL LOW (ref 250–450)
UIBC: 184 ug/dL (ref 117–376)

## 2022-03-23 LAB — FERRITIN: Ferritin: 103 ng/mL (ref 24–336)

## 2022-03-23 MED ORDER — DRONABINOL 5 MG PO CAPS: 5.0000 mg | ORAL_CAPSULE | Freq: Two times a day (BID) | ORAL | 0 refills | Status: DC

## 2022-03-23 MED ORDER — POLYSACCHARIDE IRON COMPLEX 150 MG PO CAPS: 150.0000 mg | ORAL_CAPSULE | ORAL | 2 refills | Status: AC

## 2022-03-23 MED ORDER — LANREOTIDE ACETATE 120 MG/0.5ML ~~LOC~~ SOLN
120.0000 mg | Freq: Once | SUBCUTANEOUS | Status: AC
  Administered 2022-03-23: 120 mg via SUBCUTANEOUS
  Filled 2022-03-23: qty 120

## 2022-03-24 ENCOUNTER — Encounter: Payer: Self-pay | Admitting: Internal Medicine

## 2022-03-24 ENCOUNTER — Ambulatory Visit (INDEPENDENT_AMBULATORY_CARE_PROVIDER_SITE_OTHER): Payer: Medicare Other | Admitting: Internal Medicine

## 2022-03-24 VITALS — BP 138/82 | HR 62 | Temp 97.9°F | Ht 73.0 in | Wt 126.0 lb

## 2022-03-24 DIAGNOSIS — Z23 Encounter for immunization: Secondary | ICD-10-CM | POA: Diagnosis not present

## 2022-03-24 DIAGNOSIS — N1832 Chronic kidney disease, stage 3b: Secondary | ICD-10-CM | POA: Diagnosis not present

## 2022-03-24 DIAGNOSIS — I1310 Hypertensive heart and chronic kidney disease without heart failure, with stage 1 through stage 4 chronic kidney disease, or unspecified chronic kidney disease: Secondary | ICD-10-CM | POA: Diagnosis not present

## 2022-03-24 DIAGNOSIS — N184 Chronic kidney disease, stage 4 (severe): Secondary | ICD-10-CM | POA: Diagnosis not present

## 2022-03-24 DIAGNOSIS — I480 Paroxysmal atrial fibrillation: Secondary | ICD-10-CM

## 2022-03-24 DIAGNOSIS — G903 Multi-system degeneration of the autonomic nervous system: Secondary | ICD-10-CM

## 2022-03-24 DIAGNOSIS — I951 Orthostatic hypotension: Secondary | ICD-10-CM | POA: Diagnosis not present

## 2022-03-24 DIAGNOSIS — D631 Anemia in chronic kidney disease: Secondary | ICD-10-CM | POA: Diagnosis not present

## 2022-03-24 NOTE — Progress Notes (Signed)
Subjective:  Patient ID: Osualdo Hansell Lassalle, male    DOB: [birth year]  Age: 81 y.o. MRN: [id number]  CC: Atrial Fibrillation   HPI Johannes Everage Bunch presents for f/up -  He has started taking droxidopa and has had a drastic reduction in his syncopal episodes.  He has also had a resolution of his dizziness and lightheadedness.  He continues to complain of poor appetite and weight loss.  Outpatient Medications Prior to Visit  Medication Sig Dispense Refill   albuterol (VENTOLIN HFA) 108 (90 Base) MCG/ACT inhaler Inhale 2 puffs into the lungs every 6 (six) hours as needed for wheezing or shortness of breath. 18 g 5   aspirin 81 MG EC tablet Take 81 mg by mouth daily. Swallow whole.     diphenoxylate-atropine (LOMOTIL) 2.5-0.025 MG tablet Take 1 tablet by mouth 2 (two) times daily as needed for diarrhea or loose stools. 30 tablet 0   dronabinol (MARINOL) 5 MG capsule Take 1 capsule (5 mg total) by mouth 2 (two) times daily before a meal. 60 capsule 0   droxidopa (NORTHERA) 100 MG CAPS Take 1 capsule (100 mg total) by mouth 3 (three) times daily with meals. 270 capsule 0   feeding supplement, ENSURE COMPLETE, (ENSURE COMPLETE) LIQD Take 237 mLs by mouth 2 (two) times daily between meals.     finasteride (PROSCAR) 5 MG tablet Take 5 mg by mouth daily.     fludrocortisone (FLORINEF) 0.1 MG tablet Take 1 tablet (0.1 mg total) by mouth daily. 90 tablet 2   iron polysaccharides (NIFEREX) 150 MG capsule Take 1 capsule (150 mg total) by mouth as directed. TAKE 1 CAPSULE(150 MG) BY MOUTH 3 TIMES A WEEK Strength: 150 mg 30 capsule 2   LANREOTIDE ACETATE [place] Inject 1 Dose into the skin every 30 (thirty) days.     loperamide (IMODIUM A-D) 2 MG capsule Take 2 mg by mouth daily as needed for diarrhea or loose stools.     mirtazapine (REMERON) 30 MG tablet Take 1 tablet (30 mg total) by mouth daily. 90 tablet 3   Multiple Vitamins-Minerals (PRESERVISION AREDS 2 PO) Take 1 capsule by mouth daily.     multivitamin  (ONE-A-DAY MEN'S) TABS tablet Take 1 tablet by mouth daily.     Potassium Chloride ER 20 MEQ TBCR Take 1 tablet by mouth 2 (two) times daily. 60 tablet 5   SULFAMETHOXAZOLE-TRIMETHOPRIM PO Take 1 tablet by mouth 2 (two) times daily.     SYMBICORT 160-4.5 MCG/ACT inhaler Inhale 2 puffs into the lungs daily as needed (wheezing). 3 each 1   thiamine (VITAMIN B-1) 50 MG tablet Take 1 tablet (50 mg total) by mouth daily. 90 tablet 1   No facility-administered medications prior to visit.    ROS Review of Systems  Constitutional:  Positive for appetite change, fatigue and unexpected weight change. Negative for diaphoresis.  HENT: Negative.    Eyes: Negative.   Respiratory:  Positive for shortness of breath. Negative for cough, chest tightness and wheezing.   Cardiovascular:  Negative for chest pain, palpitations and leg swelling.  Gastrointestinal:  Positive for diarrhea. Negative for abdominal pain, nausea and vomiting.  Endocrine: Negative.   Genitourinary: Negative.  Negative for difficulty urinating, dysuria and hematuria.  Musculoskeletal: Negative.   Skin: Negative.   Neurological:  Positive for syncope and weakness. Negative for dizziness and light-headedness.  Hematological:  Negative for adenopathy. Does not bruise/bleed easily.  Psychiatric/Behavioral: Negative.      Objective:  BP 138/82 (BP  Location: Right Arm, Patient Position: Sitting, Cuff Size: Large)   Pulse 62   Temp 97.9 F (36.6 C) (Oral)   Ht '6\' 1"'$  (1.854 m)   Wt 126 lb (57.2 kg)   SpO2 94%   BMI 16.62 kg/m   BP Readings from Last 3 Encounters:  03/24/22 138/82  03/23/22 101/68  02/27/22 (!) 154/90    Wt Readings from Last 3 Encounters:  03/24/22 126 lb (57.2 kg)  03/23/22 127 lb 6.4 oz (57.8 kg)  02/27/22 130 lb (59 kg)    Physical Exam Constitutional:      Appearance: He is cachectic. He is ill-appearing.  HENT:     Mouth/Throat:     Mouth: Mucous membranes are moist.  Eyes:     General: No  scleral icterus.    Conjunctiva/sclera: Conjunctivae normal.  Cardiovascular:     Rate and Rhythm: Normal rate and regular rhythm.     Heart sounds: No murmur heard. Pulmonary:     Effort: Pulmonary effort is normal.     Breath sounds: No stridor. No wheezing, rhonchi or rales.  Abdominal:     General: Abdomen is flat.     Palpations: There is no mass.     Tenderness: There is no abdominal tenderness. There is no guarding.     Hernia: No hernia is present.  Musculoskeletal:        General: Normal range of motion.     Cervical back: Neck supple.     Right lower leg: No edema.     Left lower leg: No edema.  Lymphadenopathy:     Cervical: No cervical adenopathy.  Skin:    General: Skin is warm.  Neurological:     General: No focal deficit present.     Mental Status: He is alert.     Lab Results  Component Value Date   WBC 6.1 03/23/2022   HGB 10.3 (L) 03/23/2022   HCT 29.7 (L) 03/23/2022   PLT 171 03/23/2022   GLUCOSE 108 (H) 03/23/2022   ALT 21 03/23/2022   AST 14 (L) 03/23/2022   NA 139 03/23/2022   K 4.5 03/23/2022   CL 112 (H) 03/23/2022   CREATININE 2.90 (H) 03/23/2022   BUN 56 (H) 03/23/2022   CO2 19 (L) 03/23/2022   TSH 1.591 04/22/2021   INR 1.0 01/16/2021   HGBA1C 5.1 01/16/2021    VAS [place] LOWER EXTREMITY VENOUS (DVT)  Result Date: 02/02/2022  Lower Venous DVT Study Patient Name:  SONG GARRIS [facility]  Date of Exam:   02/02/2022 Medical Rec #: [id number]        Accession #:    [id number] Date of Birth: Feb 28, 1941         Patient Gender: M Patient Age:   33 years Exam Location:  [facility] Procedure:      VAS [place] LOWER EXTREMITY VENOUS (DVT) Referring Phys: RAVI PAHWANI --------------------------------------------------------------------------------  Indications: Edema.  Comparison Study: prior 01/19/21 Performing Technologist: Archie Patten RVS  Examination Guidelines: A complete evaluation includes B-mode imaging, spectral Doppler, color Doppler, and power  Doppler as needed of all accessible portions of each vessel. Bilateral testing is considered an integral part of a complete examination. Limited examinations for reoccurring indications may be performed as noted. The reflux portion of the exam is performed with the patient in reverse Trendelenburg.  +---------+---------------+---------+-----------+----------+--------------+ RIGHT    CompressibilityPhasicitySpontaneityPropertiesThrombus Aging +---------+---------------+---------+-----------+----------+--------------+ CFV      Full           Yes  Yes                                 +---------+---------------+---------+-----------+----------+--------------+ SFJ      Full                                                        +---------+---------------+---------+-----------+----------+--------------+ FV Prox  Full                                                        +---------+---------------+---------+-----------+----------+--------------+ FV Mid   Full                                                        +---------+---------------+---------+-----------+----------+--------------+ FV DistalFull                                                        +---------+---------------+---------+-----------+----------+--------------+ PFV      Full                                                        +---------+---------------+---------+-----------+----------+--------------+ POP      Full           Yes      Yes                                 +---------+---------------+---------+-----------+----------+--------------+ PTV      Full                                                        +---------+---------------+---------+-----------+----------+--------------+ PERO     Full                                                        +---------+---------------+---------+-----------+----------+--------------+    +---------+---------------+---------+-----------+----------+--------------+ LEFT     CompressibilityPhasicitySpontaneityPropertiesThrombus Aging +---------+---------------+---------+-----------+----------+--------------+ CFV      Full           Yes      Yes                                 +---------+---------------+---------+-----------+----------+--------------+ SFJ      Full                                                        +---------+---------------+---------+-----------+----------+--------------+  FV Prox  Full                                                        +---------+---------------+---------+-----------+----------+--------------+ FV Mid   Full                                                        +---------+---------------+---------+-----------+----------+--------------+ FV DistalFull                                                        +---------+---------------+---------+-----------+----------+--------------+ PFV      Full                                                        +---------+---------------+---------+-----------+----------+--------------+ POP      Full           Yes      Yes                                 +---------+---------------+---------+-----------+----------+--------------+ PTV      Full                                                        +---------+---------------+---------+-----------+----------+--------------+ PERO     Full                                                        +---------+---------------+---------+-----------+----------+--------------+     Summary: BILATERAL: - No evidence of deep vein thrombosis seen in the lower extremities, bilaterally. -No evidence of popliteal cyst, bilaterally.   *See table(s) above for measurements and observations. Electronically signed by Servando Snare MD on 02/02/2022 at 5:13:58 PM.    Final    NM Pulmonary Perfusion  Result Date: 02/02/2022 CLINICAL DATA:   Syncope, shortness of breath EXAM: NUCLEAR MEDICINE PERFUSION LUNG SCAN TECHNIQUE: Perfusion images were obtained in multiple projections after intravenous injection of radiopharmaceutical. Ventilation scans intentionally deferred if perfusion scan and chest x-ray adequate for interpretation during COVID 19 epidemic. RADIOPHARMACEUTICALS:  Four mCi Tc-89mMAA IV COMPARISON:  Previous studies including the chest radiographs done today and CT chest done on 01/16/2021 FINDINGS: There are multiple subsegmental and segmental foci of decreased perfusion in the mid and lower lung fields on both sides, more so on the left side. There is no infiltrate in the lung fields in the chest radiograph. Evaluation is somewhat limited without ventilation images. IMPRESSION: There are multiple  segmental and subsegmental foci of decreased perfusion in both lungs, more so in the left lower lung field. Evaluation is limited without ventilation images. No focal abnormalities are seen in the lung fields in the chest radiograph. Findings suggest intermediate to high probability for pulmonary embolism. If clinically warranted, please consider venous Doppler examination of both lower extremities or CT pulmonary angiogram. Electronically Signed   By: Elmer Picker M.D.   On: 02/02/2022 15:54   DG CHEST PORT 1 VIEW  Result Date: 02/02/2022 CLINICAL DATA:  Short of breath EXAM: PORTABLE CHEST 1 VIEW COMPARISON:  01/29/2022 FINDINGS: No new consolidation or edema. No pleural effusion or pneumothorax. Stable cardiomediastinal contours with normal heart size. Left chest wall dual lead pacemaker. IMPRESSION: No acute process in the chest. Electronically Signed   By: Macy Mis M.D.   On: 02/02/2022 13:00    Assessment & Plan:   Aleksandar was seen today for atrial fibrillation.  Diagnoses and all orders for this visit:  Paroxysmal atrial fibrillation (HCC)-he has good rate and rhythm control.  Neurogenic orthostatic hypotension  (HCC)-marked improvement noted with droxidopa.  Flu vaccine need -     Flu Vaccine QUAD High Dose(Fluad)  CKD (chronic kidney disease), stage IV (HCC)-he continues to have a gradual decline in his renal function.  He is avoiding nephrotoxic agents.   I am having Josie Saunders. Pretty maintain his Multiple Vitamins-Minerals (PRESERVISION AREDS 2 PO), aspirin EC, LANREOTIDE ACETATE [place], multivitamin, feeding supplement (ENSURE COMPLETE), loperamide, Potassium Chloride ER, diphenoxylate-atropine, thiamine, albuterol, Symbicort, finasteride, SULFAMETHOXAZOLE-TRIMETHOPRIM PO, droxidopa, fludrocortisone, mirtazapine, iron polysaccharides, and dronabinol.  No orders of the defined types were placed in this encounter.    Follow-up: Return in about 3 months (around 06/23/2022).  Scarlette Calico, MD

## 2022-03-24 NOTE — Patient Instructions (Signed)

## 2022-03-25 DIAGNOSIS — I1310 Hypertensive heart and chronic kidney disease without heart failure, with stage 1 through stage 4 chronic kidney disease, or unspecified chronic kidney disease: Secondary | ICD-10-CM | POA: Diagnosis not present

## 2022-03-25 DIAGNOSIS — D631 Anemia in chronic kidney disease: Secondary | ICD-10-CM | POA: Diagnosis not present

## 2022-03-25 DIAGNOSIS — I951 Orthostatic hypotension: Secondary | ICD-10-CM | POA: Diagnosis not present

## 2022-03-25 DIAGNOSIS — N1832 Chronic kidney disease, stage 3b: Secondary | ICD-10-CM | POA: Diagnosis not present

## 2022-03-25 LAB — CHROMOGRANIN A: Chromogranin A (ng/mL): 1942 ng/mL — ABNORMAL HIGH (ref 0.0–101.8)

## 2022-03-27 DIAGNOSIS — C7A019 Malignant carcinoid tumor of the small intestine, unspecified portion: Secondary | ICD-10-CM | POA: Diagnosis not present

## 2022-03-27 DIAGNOSIS — N1832 Chronic kidney disease, stage 3b: Secondary | ICD-10-CM | POA: Diagnosis not present

## 2022-03-30 ENCOUNTER — Ambulatory Visit: Payer: Self-pay

## 2022-03-30 ENCOUNTER — Encounter: Payer: Self-pay | Admitting: Hematology and Oncology

## 2022-03-30 ENCOUNTER — Telehealth: Payer: Self-pay | Admitting: Internal Medicine

## 2022-03-30 DIAGNOSIS — N1832 Chronic kidney disease, stage 3b: Secondary | ICD-10-CM | POA: Diagnosis not present

## 2022-03-30 DIAGNOSIS — D631 Anemia in chronic kidney disease: Secondary | ICD-10-CM | POA: Diagnosis not present

## 2022-03-30 DIAGNOSIS — I951 Orthostatic hypotension: Secondary | ICD-10-CM | POA: Diagnosis not present

## 2022-03-30 DIAGNOSIS — I1310 Hypertensive heart and chronic kidney disease without heart failure, with stage 1 through stage 4 chronic kidney disease, or unspecified chronic kidney disease: Secondary | ICD-10-CM | POA: Diagnosis not present

## 2022-03-30 NOTE — Telephone Encounter (Signed)
Alan [name] is calling to request that skilled nursing continue 1 wk 3 and that home health aide for shower assistance also be continued 1 wk 3.  Notes that he has a palliative care in home visit scheduled where wife wants to discuss patient's significant decrease in appetite over the last couple of weeks. Patient's weight is holding steady and he does not show signs of dehydration. His vitals were good with the exception of slightly elevated BP of 160/90. He is asymptomatic.  Call back number is [phone]

## 2022-03-30 NOTE — Progress Notes (Signed)
Metastatic small bowel carcinoid

## 2022-03-30 NOTE — Patient Instructions (Signed)
Visit Information  Thank you for taking time to visit with me today. Please don't hesitate to contact me if I can be of assistance to you.   Following are the goals we discussed today:   Goals Addressed             This Visit's Progress    Care Coordination Post Rehab       Care Coordination Interventions: Advised patient to continue to follow up with PCP for health questions/concerns Encouraged to continue to keep track of weights and notify provider if continues to loose weight or any other concerns Encouraged wife to continue to offered and provide small meals Care Coordinator instructed home health nurse to continue to communicate and report any findings or request to primary care provider per her usual routine. Provided positive feedback to patient and wife regarding management of health        Our next appointment is by telephone on 05/04/22 at 11:00am  Please call the care guide team at [phone] if you need to cancel or reschedule your appointment.   If you are experiencing a Mental Health or [place] or need someone to talk to, please call the Suicide and Crisis Lifeline: 988  Patient verbalizes understanding of instructions and care plan provided today and agrees to view in [place]. Active MyChart status and patient understanding of how to access instructions and care plan via MyChart confirmed with patient.     Thea Silversmith, RN, MSN, BSN, [place] Coordinator [phone]

## 2022-03-30 NOTE — Patient Outreach (Signed)
  Care Coordination   Follow Up Visit Note   03/30/2022 Name: Alan [name] MRN: [id number] DOB: April 26, 1941  Alan [name] is a 81 y.o. year old male who sees Alan Lima, MD for primary care. I spoke with  Alan [name], Alan [name] by phone today.  What matters to the patients health and wellness today?  Per Alan [name], insurance has approovded  Droxidopa. She reports they paid for the first prescription out of pocket, but has been informed by the PCP office that it has been approved. She reports Alan [name] restarted the medication about 1.5 weeks ago and is doing much better. She states he has only had 1-2 episodes since patient re-started taking the medication again. She states he is walking better and able to get himself up to the bathroom, where before he was having to be pushed in a wheelchair. Patient no longer has home health PT/OT, but continues to have home health nursing and bath aide involved in care. Per Alan [name], patient continues to have decreased appetite. Per home health nurse, patient weights 126-127 pounds; BP 160/80 today - reports taking medications as prescribed. Home Health nurse continues to monitor and address home health needs.   Goals Addressed             This Visit's Progress    Care Coordination Post Rehab       Care Coordination Interventions: Advised patient to continue to follow up with PCP for health questions/concerns Encouraged to continue to keep track of weights and notify provider if continues to loose weight or any other concerns Encouraged wife to continue to offered and provide small meals Care Coordinator instructed home health nurse to continue to communicate and report any findings or request to primary care provider per her usual routine. Provided positive feedback to patient and wife regarding management of health      SDOH assessments and interventions completed:  No   Care Coordination Interventions Activated:  Yes   Care Coordination Interventions:  Yes, provided   Follow up plan: Follow up call scheduled for 05/04/22    Encounter Outcome:  Pt. Visit Completed   Alan Silversmith, RN, MSN, BSN, [place] Coordinator [phone]

## 2022-03-30 NOTE — Addendum Note (Signed)
Addended by: Sullivan Lone on: 03/30/2022 01:39 AM   Modules accepted: Orders, Level of Service

## 2022-03-30 NOTE — Progress Notes (Signed)
This encounter was created in error - please disregard.

## 2022-03-30 NOTE — Telephone Encounter (Signed)
Verbal order given to [facility] for continuance of skilled nursing continue 1 wk 3 and continuance of home health aide for shower assistance al 1 wk 3.

## 2022-03-31 ENCOUNTER — Other Ambulatory Visit: Payer: Medicare Other | Admitting: Internal Medicine

## 2022-03-31 VITALS — Wt 127.0 lb

## 2022-03-31 DIAGNOSIS — Z515 Encounter for palliative care: Secondary | ICD-10-CM | POA: Diagnosis not present

## 2022-03-31 DIAGNOSIS — C7A019 Malignant carcinoid tumor of the small intestine, unspecified portion: Secondary | ICD-10-CM | POA: Diagnosis not present

## 2022-03-31 DIAGNOSIS — N184 Chronic kidney disease, stage 4 (severe): Secondary | ICD-10-CM | POA: Diagnosis not present

## 2022-03-31 DIAGNOSIS — G903 Multi-system degeneration of the autonomic nervous system: Secondary | ICD-10-CM

## 2022-03-31 DIAGNOSIS — F329 Major depressive disorder, single episode, unspecified: Secondary | ICD-10-CM

## 2022-03-31 DIAGNOSIS — E43 Unspecified severe protein-calorie malnutrition: Secondary | ICD-10-CM | POA: Diagnosis not present

## 2022-03-31 NOTE — Progress Notes (Signed)
[facility] Follow-Up Visit Telephone: [phone]  Fax: [phone]   Date of encounter: 03/31/22 4:56 PM PATIENT NAME: Alan [name] [id number]   [phone] (home)  DOB: December 30, 1940 MRN: [id number] PRIMARY CARE PROVIDER:    [name], MD,  [place] [address] [phone]  REFERRING PROVIDER:   [name], MD [place],  [place] [address] [phone]  RESPONSIBLE PARTY:    Contact Information     Name Relation Home Work Mobile   Hungate,[name] Spouse [phone]  [phone]   [place] Pandora Leiter [phone]  [phone]        I met face to face with patient and family in his home with his wife. Palliative Care was asked to follow this patient by consultation request of  [name], MD to address advance care planning and complex medical decision making. This is follow-up visit.                                     ASSESSMENT AND PLAN / RECOMMENDATIONS:   Advance Care Planning/Goals of Care: Goals include to maximize quality of life and symptom management. Patient/health care surrogate gave his/her permission to discuss.Our advance care planning conversation included a discussion about:    The value and importance of advance care planning  Experiences with loved ones who have been seriously ill or have died  Exploration of personal, cultural or spiritual beliefs that might influence medical decisions  Exploration of goals of care in the event of a sudden injury or illness  Identification  of a healthcare agent  Review and updating or creation of an  advance directive document . Decision not to resuscitate or to de-escalate disease focused treatments due to poor prognosis. CODE STATUS:  FULL CODE--requests an attempt at CPR, but does not want to be intubated or placed on a ventilator machine.  Does not want to return to the hospital  for care.   Education was provided about the limitations of the selections above, and he continued to request the CPR selection.  Alan [name] also requests abx if indicated, IVF trial but no feeding tubes.  This was discussed in the presence of his wife who is very clear that these are his decisions.  Symptom Management/Plan: 1. Malignant carcinoid tumor of small intestine, unspecified location [facility]) -remains underlying condition leading to his decline -recommended hospice evaluation, but pt is not yet ready to accept this -he is still requesting CPR if his heart should stop though he otherwise does not want intubation or mechanical ventilation or even to return to the hospital -his wife is overwhelmed and needs more respite.    2. Neurogenic orthostatic hypotension (HCC) -no syncopal spells since they paid out of pocket for the droxidopa but did have a fall when got up too quickly taking self to commode -his wife is requesting reimbursement from insurance -also on florinef  3. Protein-calorie malnutrition, severe -Dr. Irene Limbo did send a Rx for dronabinol 50m po bid before meals to [facility] [address] last visit 93/09which certainly can be tried (pt initially was declining to take)  4. Major depressive disorder, remission status unspecified, unspecified whether recurrent -he believes the increase in remeron did help his mood though his wife is not so sure--he requires her continued presence and is very upset if she's trying to do  anything else  5. CKD (chronic kidney disease), stage IV (HCC) -limits medication options  -he does not want dialysis if it were to come to that nor does it appear he'd survive it -adjust meds accordingly  6. Palliative care by specialist -recommended hospice care, but pt not ready to accept--his wife is more open to this -may need to hear from oncology or several members of his care team in order to accept his prognosis -intake is recently bites some days, others he'll have  a breakfast sandwich and two ensures all day, drinks just two silo cups of water per day -he has no goals at this time -he's extremely frail, cachectic in appearance with severe sarcopenia of extremities -Mrs. Weatherman does not see any benefit from his lantreotide though he thinks his bms are decreased--apparently they are semi solid and less frequent (but intake also minimal) -did advise to increase marinol as Dr. Irene Limbo ordered  Follow up Palliative Care Visit: Palliative care will continue to follow for complex medical decision making, advance care planning, and clarification of goals. Return  Apr 26, 2022   This visit was coded based on medical decision making (MDM). 32 minutes on ACP including MOST  PPS: 30%  HOSPICE ELIGIBILITY/DIAGNOSIS: TBD  Chief Complaint: Follow-up palliative visit  HISTORY OF PRESENT ILLNESS:  Alan [name] is a 81 y.o. year old male  with metastatic malignant carcinoid, neurogenic orthostatic hypotension, iron deficiency anemia, CKD4, and severe protein cal mal .   See a/p above   History obtained from review of EMR, discussion with primary team, and interview with family, facility staff/caregiver and/or Alan [name].  I reviewed available labs, medications, imaging, studies and related documents from the EMR.  Records reviewed and summarized above.   ROS Review of Systemssee a/p  Physical Exam: Vitals:   03/31/22 1656  Weight: 127 lb (57.6 kg)   Body mass index is 16.76 kg/m. Wt Readings from Last 500 Encounters:  03/31/22 127 lb (57.6 kg)  03/24/22 126 lb (57.2 kg)  03/23/22 127 lb 6.4 oz (57.8 kg)  02/27/22 130 lb (59 kg)  02/26/22 134 lb (60.8 kg)  02/09/22 130 lb 1.1 oz (59 kg)  01/29/22 133 lb 6 oz (60.5 kg)  01/28/22 133 lb 6 oz (60.5 kg)  01/09/22 136 lb 3.2 oz (61.8 kg)  12/04/21 136 lb (61.7 kg)  11/25/21 141 lb 1.6 oz (64 kg)  11/10/21 144 lb (65.3 kg)  11/04/21 142 lb (64.4 kg)  10/24/21 124 lb 1.9 oz (56.3 kg)  10/13/21 136 lb  8 oz (61.9 kg)  09/01/21 139 lb (63 kg)  08/15/21 138 lb 12.8 oz (63 kg)  07/28/21 139 lb 3.2 oz (63.1 kg)  06/26/21 141 lb (64 kg)  06/11/21 139 lb 3.2 oz (63.1 kg)  06/09/21 142 lb (64.4 kg)  04/23/21 141 lb 4.8 oz (64.1 kg)  04-26-21 143 lb 12.8 oz (65.2 kg)  02/25/21 148 lb 6.4 oz (67.3 kg)  02/20/21 147 lb 9.6 oz (67 kg)  02/19/21 145 lb 9.6 oz (66 kg)  01/22/21 156 lb 12 oz (71.1 kg)  01/17/21 149 lb 3.2 oz (67.7 kg)  01/21/11 173 lb (78.5 kg)   Physical Exam Constitutional:      Appearance: He is ill-appearing.     Comments: Drowsy, frail, cachectic with buccal wasting, sarcopenia of extremities, slowly ambulatory with walker, but using condom cath and bedside commode  Cardiovascular:     Rate and Rhythm: Normal rate and regular rhythm.  Pulmonary:  Effort: Pulmonary effort is normal.     Breath sounds: No wheezing.  Abdominal:     General: Bowel sounds are normal.     Palpations: Abdomen is soft.     Comments: Visible ribs  Musculoskeletal:        General: Normal range of motion.     Right lower leg: No edema.     Left lower leg: No edema.  Neurological:     Mental Status: He is oriented to person, place, and time.     Motor: Weakness present.     Gait: Gait abnormal.  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   Anemia due to acquired thiamine deficiency 12/10/2021   Oropharyngeal dysphagia 06/27/2021   Short gut syndrome    Major depressive disorder 04/22/2021   Sinus node dysfunction ([place]) 04/22/2021   Atrial fibrillation with rapid ventricular response ([place]) 04/22/2021   AV  block, 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   Iron deficiency anemia due to chronic blood loss 02/20/2021   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[place]) 01/18/2021   GIB (gastrointestinal bleeding) 01/18/2021   Essential hypertension 01/18/2021    PAST MEDICAL HISTORY:  Active Ambulatory Problems    Diagnosis Date Noted   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[place]) 01/18/2021   GIB (gastrointestinal bleeding) 01/18/2021   Essential hypertension 01/18/2021   Iron deficiency anemia due to chronic blood loss 02/20/2021   AV block, 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   Major depressive disorder 04/22/2021   Sinus node dysfunction ([place]) 04/22/2021   Atrial fibrillation with rapid ventricular response ([place]) 04/22/2021   Short gut syndrome    Oropharyngeal dysphagia 06/27/2021   Anemia due to acquired thiamine deficiency 12/10/2021   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   Paroxysmal atrial fibrillation ([place]) 01/31/2022   History of pulmonary embolism 01/31/2022   CKD (chronic kidney disease), stage IV ([place]) 02/02/2022   Protein-calorie malnutrition, severe 02/05/2022   Neurogenic orthostatic hypotension ([place]) 03/13/2022   Resolved Ambulatory Problems    Diagnosis Date Noted   Acute pulmonary embolism ([place]) 01/16/2021   Near syncope 01/18/2021   Bradycardia 01/18/2021   Hypokalemia due to excessive gastrointestinal loss of potassium 01/18/2021   Chronic kidney disease, stage 3a ([place]) 01/18/2021   Deficiency anemia 02/19/2021   Weight loss, abnormal 02/19/2021   Productive cough 02/19/2021   Orthostatic hypotension 02/20/2021   Acute bronchitis due to 2019 novel coronavirus 02/20/2021   Syncope 04/22/2021   Elevated troponin level not due myocardial infarction 04/22/2021   Acute renal failure superimposed on stage 3b chronic kidney disease ([place]) 10/19/2021   Deficiency anemia 12/04/2021   Skin tear of left forearm without complication 55/97/4163   Acute kidney injury superimposed on chronic  kidney disease ([place]) 01/28/2022   Back abscess 01/28/2022   Hypotension 01/31/2022   Syncope 02/01/2022   Acute on chronic anemia 02/02/2022   Past Medical History:  Diagnosis Date   Asthma    Carcinoid tumor, small intestine, malignant (HCC)    Chronic diarrhea    CKD (chronic kidney disease) stage 3, GFR 30-59 ml/min (HCC)    HTN (hypertension)     SOCIAL HX:  Social History   Tobacco Use   Smoking status: Some Days    Packs/day: 0.30    Years: 61.00    Total pack years: 18.30    Types: Cigars, Cigarettes    Start date: 69    Last attempt to quit: 03/03/2020    Years since quitting: 2.0   Smokeless tobacco: Never   Tobacco comments:    Former cig   Substance Use Topics   Alcohol use: Not Currently     ALLERGIES:  Allergies  Allergen Reactions   Penicillins Hives and Shortness Of Breath   Lisinopril Itching      PERTINENT MEDICATIONS:  Outpatient Encounter Medications as of 03/31/2022  Medication Sig   albuterol (VENTOLIN HFA) 108 (90 Base) MCG/ACT inhaler Inhale 2 puffs into the lungs every 6 (six) hours as needed for wheezing or shortness of breath.   aspirin 81 MG EC tablet Take 81 mg by mouth daily. Swallow whole.   diphenoxylate-atropine (LOMOTIL) 2.5-0.025 MG tablet Take 1 tablet by mouth 2 (two) times daily as needed for diarrhea or loose stools.   dronabinol (MARINOL) 5 MG capsule Take 1 capsule (5 mg total) by mouth 2 (two) times daily before a meal.   droxidopa (NORTHERA) 100 MG CAPS Take 1 capsule (100 mg total) by mouth 3 (three) times daily with meals.   feeding supplement, ENSURE COMPLETE, (ENSURE COMPLETE) LIQD Take 237 mLs by mouth 2 (two) times daily between meals.   finasteride (PROSCAR) 5 MG tablet Take 5 mg by mouth daily.   fludrocortisone (FLORINEF) 0.1 MG tablet Take 1 tablet (0.1 mg total) by mouth daily.   iron polysaccharides (NIFEREX) 150 MG capsule Take 1 capsule (150 mg total) by mouth as directed. TAKE 1 CAPSULE(150 MG) BY MOUTH 3  TIMES A WEEK Strength: 150 mg   LANREOTIDE ACETATE [place] Inject 1 Dose into the skin every 30 (thirty) days.   loperamide (IMODIUM A-D) 2 MG capsule Take 2 mg by mouth daily as needed for diarrhea or loose stools.   mirtazapine (REMERON) 30 MG tablet Take 1 tablet (30 mg total) by mouth daily.   Multiple Vitamins-Minerals (PRESERVISION AREDS 2 PO) Take 1 capsule by mouth daily.   multivitamin (ONE-A-DAY MEN'S) TABS tablet Take 1 tablet by mouth daily.   Potassium Chloride ER 20 MEQ TBCR Take 1 tablet by mouth 2 (two) times daily.   SULFAMETHOXAZOLE-TRIMETHOPRIM PO Take 1 tablet by mouth 2 (two) times daily.   SYMBICORT 160-4.5 MCG/ACT inhaler Inhale 2 puffs into the lungs daily as needed (wheezing).   thiamine (VITAMIN B-1) 50 MG tablet Take 1 tablet (50 mg total) by mouth daily.   No facility-administered encounter medications on file as of 03/31/2022.    Thank you for the opportunity to participate in the care of Alan [name].  The palliative care team will continue to follow. Please call our office at [phone] if we can be of additional assistance.   Hollace Kinnier, DO  COVID-19 PATIENT SCREENING TOOL Asked and negative response unless otherwise noted:  Have you had symptoms of covid, tested positive or been in contact with someone with symptoms/positive test in the past 5-10 days? no

## 2022-04-01 ENCOUNTER — Other Ambulatory Visit (HOSPITAL_COMMUNITY): Payer: Self-pay | Admitting: Urology

## 2022-04-01 ENCOUNTER — Other Ambulatory Visit: Payer: Self-pay | Admitting: Urology

## 2022-04-01 DIAGNOSIS — D4102 Neoplasm of uncertain behavior of left kidney: Secondary | ICD-10-CM

## 2022-04-03 DIAGNOSIS — N1832 Chronic kidney disease, stage 3b: Secondary | ICD-10-CM | POA: Diagnosis not present

## 2022-04-03 DIAGNOSIS — D631 Anemia in chronic kidney disease: Secondary | ICD-10-CM | POA: Diagnosis not present

## 2022-04-03 DIAGNOSIS — I951 Orthostatic hypotension: Secondary | ICD-10-CM | POA: Diagnosis not present

## 2022-04-03 DIAGNOSIS — I1310 Hypertensive heart and chronic kidney disease without heart failure, with stage 1 through stage 4 chronic kidney disease, or unspecified chronic kidney disease: Secondary | ICD-10-CM | POA: Diagnosis not present

## 2022-04-05 ENCOUNTER — Encounter: Payer: Self-pay | Admitting: Internal Medicine

## 2022-04-06 DIAGNOSIS — I951 Orthostatic hypotension: Secondary | ICD-10-CM | POA: Diagnosis not present

## 2022-04-06 DIAGNOSIS — I1310 Hypertensive heart and chronic kidney disease without heart failure, with stage 1 through stage 4 chronic kidney disease, or unspecified chronic kidney disease: Secondary | ICD-10-CM | POA: Diagnosis not present

## 2022-04-06 DIAGNOSIS — D631 Anemia in chronic kidney disease: Secondary | ICD-10-CM | POA: Diagnosis not present

## 2022-04-06 DIAGNOSIS — N1832 Chronic kidney disease, stage 3b: Secondary | ICD-10-CM | POA: Diagnosis not present

## 2022-04-07 ENCOUNTER — Telehealth: Payer: Self-pay | Admitting: Internal Medicine

## 2022-04-07 DIAGNOSIS — I1310 Hypertensive heart and chronic kidney disease without heart failure, with stage 1 through stage 4 chronic kidney disease, or unspecified chronic kidney disease: Secondary | ICD-10-CM | POA: Diagnosis not present

## 2022-04-07 DIAGNOSIS — D631 Anemia in chronic kidney disease: Secondary | ICD-10-CM | POA: Diagnosis not present

## 2022-04-07 DIAGNOSIS — N1832 Chronic kidney disease, stage 3b: Secondary | ICD-10-CM | POA: Diagnosis not present

## 2022-04-07 DIAGNOSIS — I951 Orthostatic hypotension: Secondary | ICD-10-CM | POA: Diagnosis not present

## 2022-04-07 NOTE — Telephone Encounter (Signed)
Angel a Equities trader with [name] called to give update. She said patient is having increase in shortness of breath today, hes having pain in shoulder that increases with shortness of breath, he said its from a fall he had a month ago. His vitals are good overall. His BP was 120/78 while sitting, when he got up to go to his bedroom he got short of breath and light headed and when they checked his bp then it was 96/78, Pain in Clavical area in right shoulder when this happen. She said hes doing good now and is eating.  Call back with further advisement at [phone]

## 2022-04-08 ENCOUNTER — Ambulatory Visit: Payer: Medicare Other | Admitting: Internal Medicine

## 2022-04-14 DIAGNOSIS — N1832 Chronic kidney disease, stage 3b: Secondary | ICD-10-CM | POA: Diagnosis not present

## 2022-04-14 DIAGNOSIS — I951 Orthostatic hypotension: Secondary | ICD-10-CM | POA: Diagnosis not present

## 2022-04-14 DIAGNOSIS — D631 Anemia in chronic kidney disease: Secondary | ICD-10-CM | POA: Diagnosis not present

## 2022-04-14 DIAGNOSIS — I1310 Hypertensive heart and chronic kidney disease without heart failure, with stage 1 through stage 4 chronic kidney disease, or unspecified chronic kidney disease: Secondary | ICD-10-CM | POA: Diagnosis not present

## 2022-04-17 ENCOUNTER — Encounter: Payer: Self-pay | Admitting: Hematology and Oncology

## 2022-04-20 ENCOUNTER — Inpatient Hospital Stay: Payer: Medicare Other | Attending: Hematology

## 2022-04-20 ENCOUNTER — Other Ambulatory Visit: Payer: Self-pay

## 2022-04-20 ENCOUNTER — Inpatient Hospital Stay: Payer: Medicare Other

## 2022-04-20 VITALS — BP 145/80 | HR 67 | Temp 97.8°F | Resp 16

## 2022-04-20 DIAGNOSIS — C7A019 Malignant carcinoid tumor of the small intestine, unspecified portion: Secondary | ICD-10-CM

## 2022-04-20 LAB — CBC WITH DIFFERENTIAL (CANCER CENTER ONLY)
Abs Immature Granulocytes: 0.01 10*3/uL (ref 0.00–0.07)
Basophils Absolute: 0 10*3/uL (ref 0.0–0.1)
Basophils Relative: 0 %
Eosinophils Absolute: 0.1 10*3/uL (ref 0.0–0.5)
Eosinophils Relative: 1 %
HCT: 28.7 % — ABNORMAL LOW (ref 39.0–52.0)
Hemoglobin: 10.1 g/dL — ABNORMAL LOW (ref 13.0–17.0)
Immature Granulocytes: 0 %
Lymphocytes Relative: 23 %
Lymphs Abs: 1.6 10*3/uL (ref 0.7–4.0)
MCH: 31.5 pg (ref 26.0–34.0)
MCHC: 35.2 g/dL (ref 30.0–36.0)
MCV: 89.4 fL (ref 80.0–100.0)
Monocytes Absolute: 0.6 10*3/uL (ref 0.1–1.0)
Monocytes Relative: 8 %
Neutro Abs: 4.6 10*3/uL (ref 1.7–7.7)
Neutrophils Relative %: 68 %
Platelet Count: 193 10*3/uL (ref 150–400)
RBC: 3.21 MIL/uL — ABNORMAL LOW (ref 4.22–5.81)
RDW: 14.1 % (ref 11.5–15.5)
WBC Count: 6.9 10*3/uL (ref 4.0–10.5)
nRBC: 0 % (ref 0.0–0.2)

## 2022-04-20 LAB — CMP (CANCER CENTER ONLY)
ALT: 44 U/L (ref 0–44)
AST: 29 U/L (ref 15–41)
Albumin: 3.7 g/dL (ref 3.5–5.0)
Alkaline Phosphatase: 52 U/L (ref 38–126)
Anion gap: 6 (ref 5–15)
BUN: 46 mg/dL — ABNORMAL HIGH (ref 8–23)
CO2: 24 mmol/L (ref 22–32)
Calcium: 8.8 mg/dL — ABNORMAL LOW (ref 8.9–10.3)
Chloride: 109 mmol/L (ref 98–111)
Creatinine: 2.76 mg/dL — ABNORMAL HIGH (ref 0.61–1.24)
GFR, Estimated: 23 mL/min — ABNORMAL LOW (ref >60)
Glucose, Bld: 118 mg/dL — ABNORMAL HIGH (ref 70–99)
Potassium: 4.5 mmol/L (ref 3.5–5.1)
Sodium: 139 mmol/L (ref 135–145)
Total Bilirubin: 0.5 mg/dL (ref 0.3–1.2)
Total Protein: 6.6 g/dL (ref 6.5–8.1)

## 2022-04-20 LAB — IRON AND IRON BINDING CAPACITY (CC-WL,HP ONLY)
Iron: 60 ug/dL (ref 45–182)
Saturation Ratios: 29 % (ref 17.9–39.5)
TIBC: 206 ug/dL — ABNORMAL LOW (ref 250–450)
UIBC: 146 ug/dL (ref 117–376)

## 2022-04-20 MED ORDER — LANREOTIDE ACETATE 120 MG/0.5ML ~~LOC~~ SOLN
120.0000 mg | Freq: Once | SUBCUTANEOUS | Status: AC
  Administered 2022-04-20 (×2): 120 mg via SUBCUTANEOUS
  Filled 2022-04-20: qty 120

## 2022-04-20 NOTE — Patient Instructions (Signed)
Lanreotide Injection What is this medication? LANREOTIDE (lan REE oh tide) treats high levels of growth hormone (acromegaly). It is used when other therapies have not worked well enough or cannot be tolerated. It works by reducing the amount of growth hormone your body makes. This reduces symptoms and the risk of health problems caused by too much growth hormone, such as diabetes and heart disease. It may also be used to treat neuroendocrine tumors, a cancer of the cells that release hormones and other substances in your body. It works by slowing down the release of these substances from the cells. This slows tumor growth. It also decreases the symptoms of carcinoid syndrome, such as flushing or diarrhea. This medicine may be used for other purposes; ask your health care provider or pharmacist if you have questions. COMMON BRAND NAME(S): Somatuline [place] What should I tell my care team before I take this medication? They need to know if you have any of these conditions: Diabetes Gallbladder disease Heart disease Kidney disease Liver disease Thyroid disease An unusual or allergic reaction to lanreotide, other medications, foods, dyes, or preservatives Pregnant or trying to get pregnant Breast-feeding How should I use this medication? This medication is injected under the skin. It is given by your care team in a hospital or clinic setting. Talk to your care team about the use of this medication in children. Special care may be needed. Overdosage: If you think you have taken too much of this medicine contact a poison control center or emergency room at once. NOTE: This medicine is only for you. Do not share this medicine with others. What if I miss a dose? Keep appointments for follow-up doses. It is important not to miss your dose. Call your care team if you are unable to keep an appointment. What may interact with this medication? Bromocriptine Cyclosporine Certain medications for blood  pressure, heart disease, irregular heartbeat Certain medications for diabetes Quinidine Terfenadine This list may not describe all possible interactions. Give your health care provider a list of all the medicines, herbs, non-prescription drugs, or dietary supplements you use. Also tell them if you smoke, drink alcohol, or use illegal drugs. Some items may interact with your medicine. What should I watch for while using this medication? Visit your care team for regular checks on your progress. Tell your care team if your symptoms do not start to get better or if they get worse. Your condition will be monitored carefully while you are receiving this medication. You may need blood work while you are taking this medication. This medication may increase blood sugar. The risk may be higher in patients who already have diabetes. Ask your care team what you can do to lower your risk of diabetes while taking this medication. Talk to your care team if you wish to become pregnant or think you may be pregnant. This medication can cause serious birth defects. Do not breast-feed while taking this medication and for 6 months after stopping therapy. This medication may cause infertility. Talk to your care team if you are concerned about your fertility. What side effects may I notice from receiving this medication? Side effects that you should report to your care team as soon as possible: Allergic reactions--skin rash, itching, hives, swelling of the face, lips, tongue, or throat Gallbladder problems--severe stomach pain, nausea, vomiting, fever High blood sugar (hyperglycemia)--increased thirst or amount of urine, unusual weakness or fatigue, blurry vision Increase in blood pressure Low blood sugar (hypoglycemia)--tremors or shaking, anxiety, sweating, cold   or clammy skin, confusion, dizziness, rapid heartbeat Low thyroid levels (hypothyroidism)--unusual weakness or fatigue, increased sensitivity to cold,  constipation, hair loss, dry skin, weight gain, feelings of depression Slow heartbeat--dizziness, feeling faint or lightheaded, confusion, trouble breathing, unusual weakness or fatigue Side effects that usually do not require medical attention (report to your care team if they continue or are bothersome): Diarrhea Dizziness Headache Muscle spasms Nausea Pain, redness, irritation, or bruising at the injection site Stomach pain This list may not describe all possible side effects. Call your doctor for medical advice about side effects. You may report side effects to FDA at 1-800-FDA-1088. Where should I keep my medication? This medication is given in a hospital or clinic. It will not be stored at home. NOTE: This sheet is a summary. It may not cover all possible information. If you have questions about this medicine, talk to your doctor, pharmacist, or health care provider.  2023 Elsevier/Gold Standard (2021-09-03 00:00:00)  

## 2022-04-21 ENCOUNTER — Other Ambulatory Visit: Payer: Medicare Other | Admitting: Internal Medicine

## 2022-04-21 ENCOUNTER — Encounter: Payer: Self-pay | Admitting: Internal Medicine

## 2022-04-21 VITALS — BP 112/80 | HR 73 | Temp 98.1°F | Wt 120.0 lb

## 2022-04-21 DIAGNOSIS — N184 Chronic kidney disease, stage 4 (severe): Secondary | ICD-10-CM

## 2022-04-21 DIAGNOSIS — G903 Multi-system degeneration of the autonomic nervous system: Secondary | ICD-10-CM | POA: Diagnosis not present

## 2022-04-21 DIAGNOSIS — Z515 Encounter for palliative care: Secondary | ICD-10-CM

## 2022-04-21 DIAGNOSIS — E43 Unspecified severe protein-calorie malnutrition: Secondary | ICD-10-CM | POA: Diagnosis not present

## 2022-04-21 DIAGNOSIS — C7A019 Malignant carcinoid tumor of the small intestine, unspecified portion: Secondary | ICD-10-CM

## 2022-04-21 DIAGNOSIS — F329 Major depressive disorder, single episode, unspecified: Secondary | ICD-10-CM

## 2022-04-21 LAB — FERRITIN: Ferritin: 95 ng/mL (ref 24–336)

## 2022-04-22 ENCOUNTER — Encounter: Payer: Self-pay | Admitting: Internal Medicine

## 2022-04-22 LAB — CHROMOGRANIN A: Chromogranin A (ng/mL): 1645 ng/mL — ABNORMAL HIGH (ref 0.0–101.8)

## 2022-04-22 NOTE — Progress Notes (Signed)
[facility] Follow-Up Visit Telephone: [phone]  Fax: [phone]   Date of encounter: 04/22/22 7:16 PM PATIENT NAME: Alan [name] [id number]   [phone] (home)  DOB: 29-Mar-1941 MRN: [id number] PRIMARY CARE PROVIDER:    [name], MD,  [place] [address] [phone]  REFERRING PROVIDER:   [name], MD [place],  [place] [address] [phone]  RESPONSIBLE PARTY:    Contact Information     Name Relation Home Work Mobile   Quinlivan,[name] Spouse [phone]  [phone]   Chalfant Pandora Leiter [phone]  [phone]        I met face to face with patient and family in his home. Palliative Care was asked to follow this patient by consultation request of  [name], MD to address advance care planning and complex medical decision making. This is follow-up visit.                                     ASSESSMENT AND PLAN / RECOMMENDATIONS:   Advance Care Planning/Goals of Care: Goals include to maximize quality of life and symptom management. Patient/health care surrogate gave his/her permission to discuss.Our advance care planning conversation included a discussion about:    The value and importance of advance care planning  Experiences with loved ones who have been seriously ill or have died  Exploration of personal, cultural or spiritual beliefs that might influence medical decisions  Exploration of goals of care in the event of a sudden injury or illness  Identification  of a healthcare agent  Review and updating or creation of an  advance directive document . Decision not to resuscitate or to de-escalate disease focused treatments due to poor prognosis. CODE STATUS:  full code for cardiac arrest, but if not able to sustain himself, requests comfort measures, abx if indicated, IVF trial, no feeding tube Pt and wife report some  improvement since last time--he was able to be sitting up in the living room doing paperwork when I arrived--was conversive and had eaten a full breakfast and a snack so far mid-day Had opted to get one more injection of lanreotide to see if it made a difference with his stools--reportedly it has worked better this time. He's still taking the northera for his neurogenic orthostatic hypotension with benefit and no dizziness or near syncope since consistent use They were able to get a supply of this, but now insurance will not continue to cover it so she's having to contest this.  Symptom Management/Plan: 1. Malignant carcinoid tumor of small intestine, unspecified location ([place]) -had lanreotide again, but may be last injection he received depending on how he does up to the next time it's due, but so far reports his bowels have been under good control and his wife agreed (previously she had said it was not helping)  2. Neurogenic orthostatic hypotension (HCC) -much better with droxidopa and not having dizziness/lightheadedness or syncope since consistent use -insurance not wanting to cover continued use despite proper documentation and clear benefit--his wife is contesting this -using bid instead of tid as BPs actually too high in afternoons now  3. Protein-calorie malnutrition, severe -intake improved these past few days though still primarily eats breakfast and minimally at other meals-wt 120 lbs -they requested his mirtazapine be increased by another 7.87m due to benefit from  his wife giving him more--next dose is actually 4m  4. Major depressive disorder, remission status unspecified, unspecified whether recurrent -he's sleeping 14 hrs per day but mood might be a little better on higher dose remeron--seems to mistrust his wife and blames her often for things, she is frustrated that he wants her continuous attention  5. CKD (chronic kidney disease), stage IV (HCC) -with iron deficiency on  supplementation -likely to progress when he has periods of poor po intake -avoid nephrotoxic agents and adjust dosing as appropriate -encourage hydration with water  6. Palliative care by specialist -slight improvement this visit -continue to monitor monthly with RN/SW as he is so near to hospice eligibilty depending on whether he does benefit from the lantreotide ongoing     Follow up Palliative Care Visit: Palliative care will continue to follow for complex medical decision making, advance care planning, and clarification of goals. Return 4 weeks or prn.   This visit was coded based on medical decision making (MDM).  PPS: weak 50%  HOSPICE ELIGIBILITY/DIAGNOSIS: TBD  Chief Complaint: Follow-up palliative visit  HISTORY OF PRESENT ILLNESS:  LRomani [name] is a 81y.o. year old male  with malignant carcinoid of small bowel, CKD4, severe protein cal mal with abnormal weight loss, functional debility, neurogenic orthostatic hypotension, h/o recurrent syncope, and depression seen in palliative f/u.  See a/p for more info   History obtained from review of EMR, discussion with primary team, and interview with family, facility staff/caregiver and/or Mr. Alan [name].  I reviewed available labs, medications, imaging, studies and related documents from the EMR.  Records reviewed and summarized above.   ROS Review of Systemssee a/p  Physical Exam: Vitals:   04/21/22 1915  BP: 112/80  Pulse: 73  Temp: 98.1 F (36.7 C)  SpO2: 98%  Weight: 120 lb (54.4 kg)   Body mass index is 15.83 kg/m. Wt Readings from Last 500 Encounters:  04/21/22 120 lb (54.4 kg)  03/31/22 127 lb (57.6 kg)  03/24/22 126 lb (57.2 kg)  03/23/22 127 lb 6.4 oz (57.8 kg)  02/27/22 130 lb (59 kg)  02/26/22 134 lb (60.8 kg)  02/09/22 130 lb 1.1 oz (59 kg)  01/29/22 133 lb 6 oz (60.5 kg)  01/28/22 133 lb 6 oz (60.5 kg)  01/09/22 136 lb 3.2 oz (61.8 kg)  12/04/21 136 lb (61.7 kg)  11/25/21 141 lb 1.6 oz (64 kg)   11/10/21 144 lb (65.3 kg)  11/04/21 142 lb (64.4 kg)  10/24/21 124 lb 1.9 oz (56.3 kg)  10/13/21 136 lb 8 oz (61.9 kg)  09/01/21 139 lb (63 kg)  08/15/21 138 lb 12.8 oz (63 kg)  07/28/21 139 lb 3.2 oz (63.1 kg)  06/26/21 141 lb (64 kg)  06/11/21 139 lb 3.2 oz (63.1 kg)  06/09/21 142 lb (64.4 kg)  04/23/21 141 lb 4.8 oz (64.1 kg)  04/21/21 143 lb 12.8 oz (65.2 kg)  02/25/21 148 lb 6.4 oz (67.3 kg)  02/20/21 147 lb 9.6 oz (67 kg)  02/19/21 145 lb 9.6 oz (66 kg)  01/22/21 156 lb 12 oz (71.1 kg)  01/17/21 149 lb 3.2 oz (67.7 kg)  01/21/11 173 lb (78.5 kg)   Physical Exam Constitutional:      Comments: Cachectic male sitting up in chair in living room today  Eyes:     Comments: glasses  Cardiovascular:     Rate and Rhythm: Normal rate and regular rhythm.  Pulmonary:     Effort: Pulmonary effort is normal.  Breath sounds: Normal breath sounds.  Musculoskeletal:        General: Normal range of motion.     Right lower leg: No edema.     Left lower leg: No edema.  Skin:    General: Skin is warm and dry.  Neurological:     General: No focal deficit present.     Mental Status: He is alert and oriented to person, place, and time.  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   Anemia due to acquired thiamine deficiency 12/10/2021   Oropharyngeal dysphagia 06/27/2021   Short gut syndrome    Major depressive disorder 04/22/2021   Sinus node dysfunction ([place]) 04/22/2021    Atrial fibrillation with rapid ventricular response ([place]) 04/22/2021   AV block, 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   Iron deficiency anemia due to chronic blood loss 02/20/2021   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[place]) 01/18/2021   GIB (gastrointestinal bleeding) 01/18/2021   Essential hypertension 01/18/2021    PAST MEDICAL HISTORY:  Active Ambulatory Problems    Diagnosis Date Noted   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[place]) 01/18/2021   GIB (gastrointestinal bleeding) 01/18/2021   Essential hypertension 01/18/2021   Iron deficiency anemia due to chronic blood loss 02/20/2021   AV block, 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   Major depressive disorder 04/22/2021   Sinus node dysfunction ([place]) 04/22/2021   Atrial fibrillation with rapid ventricular response ([place]) 04/22/2021   Short gut syndrome    Oropharyngeal dysphagia 06/27/2021   Anemia due to acquired thiamine deficiency 12/10/2021   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   Paroxysmal atrial fibrillation ([place]) 01/31/2022   History of pulmonary embolism 01/31/2022   CKD (chronic kidney disease), stage IV ([place]) 02/02/2022   Protein-calorie malnutrition, severe 02/05/2022   Neurogenic orthostatic hypotension ([place]) 03/13/2022   Resolved Ambulatory Problems    Diagnosis Date Noted   Acute pulmonary embolism ([place]) 01/16/2021   Near syncope 01/18/2021   Bradycardia 01/18/2021   Hypokalemia due to excessive gastrointestinal loss of potassium 01/18/2021   Chronic kidney disease, stage 3a ([place]) 01/18/2021   Deficiency anemia 02/19/2021   Weight loss, abnormal 02/19/2021   Productive cough 02/19/2021   Orthostatic hypotension 02/20/2021   Acute bronchitis due to 2019 novel coronavirus 02/20/2021   Syncope 04/22/2021   Elevated troponin level not due myocardial infarction 04/22/2021   Acute renal failure superimposed on stage 3b chronic kidney disease ([place]) 10/19/2021   Deficiency anemia 12/04/2021   Skin tear of left forearm  without complication 67/59/1638   Acute kidney injury superimposed on chronic kidney disease ([place]) 01/28/2022   Back abscess 01/28/2022   Hypotension 01/31/2022   Syncope 02/01/2022   Acute on chronic anemia 02/02/2022   Past Medical History:  Diagnosis Date   Asthma    Carcinoid tumor, small intestine, malignant (HCC)    Chronic diarrhea    CKD (chronic kidney disease) stage 3, GFR 30-59 ml/min (HCC)    HTN (hypertension)     SOCIAL HX:  Social History  Tobacco Use   Smoking status: Some Days    Packs/day: 0.30    Years: 61.00    Total pack years: 18.30    Types: Cigars, Cigarettes    Start date: 74    Last attempt to quit: 03/03/2020    Years since quitting: 2.1   Smokeless tobacco: Never   Tobacco comments:    Former cig   Substance Use Topics   Alcohol use: Not Currently     ALLERGIES:  Allergies  Allergen Reactions   Penicillins Hives and Shortness Of Breath   Lisinopril Itching      PERTINENT MEDICATIONS:  Outpatient Encounter Medications as of 04/21/2022  Medication Sig   albuterol (VENTOLIN HFA) 108 (90 Base) MCG/ACT inhaler Inhale 2 puffs into the lungs every 6 (six) hours as needed for wheezing or shortness of breath.   aspirin 81 MG EC tablet Take 81 mg by mouth daily. Swallow whole.   diphenoxylate-atropine (LOMOTIL) 2.5-0.025 MG tablet Take 1 tablet by mouth 2 (two) times daily as needed for diarrhea or loose stools.   dronabinol (MARINOL) 5 MG capsule Take 1 capsule (5 mg total) by mouth 2 (two) times daily before a meal.   droxidopa (NORTHERA) 100 MG CAPS Take 1 capsule (100 mg total) by mouth 3 (three) times daily with meals.   feeding supplement, ENSURE COMPLETE, (ENSURE COMPLETE) LIQD Take 237 mLs by mouth 2 (two) times daily between meals.   finasteride (PROSCAR) 5 MG tablet Take 5 mg by mouth daily.   fludrocortisone (FLORINEF) 0.1 MG tablet Take 1 tablet (0.1 mg total) by mouth daily.   iron polysaccharides (NIFEREX) 150 MG capsule Take 1  capsule (150 mg total) by mouth as directed. TAKE 1 CAPSULE(150 MG) BY MOUTH 3 TIMES A WEEK Strength: 150 mg   LANREOTIDE ACETATE [place] Inject 1 Dose into the skin every 30 (thirty) days.   loperamide (IMODIUM A-D) 2 MG capsule Take 2 mg by mouth daily as needed for diarrhea or loose stools.   mirtazapine (REMERON) 30 MG tablet Take 1 tablet (30 mg total) by mouth daily.   Multiple Vitamins-Minerals (PRESERVISION AREDS 2 PO) Take 1 capsule by mouth daily.   multivitamin (ONE-A-DAY MEN'S) TABS tablet Take 1 tablet by mouth daily.   Potassium Chloride ER 20 MEQ TBCR Take 1 tablet by mouth 2 (two) times daily.   SULFAMETHOXAZOLE-TRIMETHOPRIM PO Take 1 tablet by mouth 2 (two) times daily.   SYMBICORT 160-4.5 MCG/ACT inhaler Inhale 2 puffs into the lungs daily as needed (wheezing).   thiamine (VITAMIN B-1) 50 MG tablet Take 1 tablet (50 mg total) by mouth daily.   No facility-administered encounter medications on file as of 04/21/2022.    Thank you for the opportunity to participate in the care of Mr. Silverio.  The palliative care team will continue to follow. Please call our office at [phone] if we can be of additional assistance.   Hollace Kinnier, DO  COVID-19 PATIENT SCREENING TOOL Asked and negative response unless otherwise noted:  Have you had symptoms of covid, tested positive or been in contact with someone with symptoms/positive test in the past 5-10 days? no

## 2022-04-23 ENCOUNTER — Ambulatory Visit (INDEPENDENT_AMBULATORY_CARE_PROVIDER_SITE_OTHER): Payer: Medicare Other

## 2022-04-23 ENCOUNTER — Telehealth: Payer: Self-pay | Admitting: Internal Medicine

## 2022-04-23 ENCOUNTER — Encounter: Payer: Self-pay | Admitting: Cardiology

## 2022-04-23 ENCOUNTER — Ambulatory Visit: Payer: PRIVATE HEALTH INSURANCE | Attending: Cardiology | Admitting: Cardiology

## 2022-04-23 VITALS — BP 120/80 | HR 82 | Ht 73.0 in | Wt 127.0 lb

## 2022-04-23 DIAGNOSIS — I495 Sick sinus syndrome: Secondary | ICD-10-CM | POA: Diagnosis not present

## 2022-04-23 DIAGNOSIS — I4891 Unspecified atrial fibrillation: Secondary | ICD-10-CM

## 2022-04-23 DIAGNOSIS — C7A019 Malignant carcinoid tumor of the small intestine, unspecified portion: Secondary | ICD-10-CM | POA: Diagnosis not present

## 2022-04-23 NOTE — Patient Instructions (Signed)
Medication Instructions:  The current medical regimen is effective;  continue present plan and medications.  *If you need a refill on your cardiac medications before your next appointment, please call your pharmacy*  Follow-Up: At [facility], you and your health needs are our priority.  As part of our continuing mission to provide you with exceptional heart care, we have created designated Provider Care Teams.  These Care Teams include your primary Cardiologist (physician) and Advanced Practice Providers (APPs -  Physician Assistants and Nurse Practitioners) who all work together to provide you with the care you need, when you need it.  We recommend signing up for the patient portal called "MyChart".  Sign up information is provided on this After Visit Summary.  MyChart is used to connect with patients for Virtual Visits (Telemedicine).  Patients are able to view lab/test results, encounter notes, upcoming appointments, etc.  Non-urgent messages can be sent to your provider as well.   To learn more about what you can do with MyChart, go to [email or web address].    Your next appointment:   4 month(s)  The format for your next appointment:   In Person  Provider:   Dr Candee Furbish      Important Information About Sugar

## 2022-04-23 NOTE — Telephone Encounter (Signed)
Wife came and picked up husbands paperwork.

## 2022-04-23 NOTE — Progress Notes (Signed)
Cardiology Office Note:    Date:  04/23/2022   ID:  Alan [name], DOB 01-Oct-1940, MRN [id number]  PCP:  [name], MD   [facility] Providers Cardiologist:  Fransico Him, MD Electrophysiologist:  Constance Haw, MD     Referring MD: [name], MD   History of Present Illness:    Alan [name] is a 81 y.o. male here for follow-up of atrial fibrillation, and hypertension.  Last visit, he was accompanied by his wife. He was intolerant of Midodrine. He had a mechanical fall but denied any syncope. His wife reported that he had not been eating much. He stated this is in part due to having excess gas build-up. Also she had continued to notice a tar-like color in his stool.  He was admitted in 12/2020 for presyncope. Last seen by Dr. Martinique on 02/20/2021 for post-hospital follow-up. He had no recurrent syncope or near-syncope at that time. A 3 week event monitor was ordered.  Today, the patient is accompanied by a family member. He states that he has been feeling worse recently. He has been having more shortness of breath and has had to use his Symbicort inhaler more often.   [name] has been very cost prohibitive for him because the insurance had been late approving the medication. They had then bought it out of pocket with hopes of being reimbursed. The insurance discontinued their coverage as a result. His Northera has been really helping with his lack of balance and fall risk.  He denies any palpitations, chest pain, or peripheral edema. No lightheadedness, headaches, syncope, orthopnea, or PND.  Past Medical History:  Diagnosis Date   Asthma    Carcinoid tumor, small intestine, malignant (HCC)    Chronic diarrhea    CKD (chronic kidney disease) stage 3, GFR 30-59 ml/min (HCC)    HTN (hypertension)    Short gut syndrome     Past Surgical History:  Procedure Laterality Date   COLON SURGERY     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   Procedure: PACEMAKER  IMPLANT;  Surgeon: Constance Haw, MD;  Location: [place] [facility];  Service: Cardiovascular;  Laterality: N/A;    Current Medications: Current Meds  Medication Sig   albuterol (VENTOLIN HFA) 108 (90 Base) MCG/ACT inhaler Inhale 2 puffs into the lungs every 6 (six) hours as needed for wheezing or shortness of breath.   aspirin 81 MG EC tablet Take 81 mg by mouth daily. Swallow whole.   diphenoxylate-atropine (LOMOTIL) 2.5-0.025 MG tablet Take 1 tablet by mouth 2 (two) times daily as needed for diarrhea or loose stools.   dronabinol (MARINOL) 5 MG capsule Take 1 capsule (5 mg total) by mouth 2 (two) times daily before a meal.   droxidopa (NORTHERA) 100 MG CAPS Take 1 capsule (100 mg total) by mouth 3 (three) times daily with meals.   feeding supplement, ENSURE COMPLETE, (ENSURE COMPLETE) LIQD Take 237 mLs by mouth 2 (two) times daily between meals.   finasteride (PROSCAR) 5 MG tablet Take 5 mg by mouth daily.   fludrocortisone (FLORINEF) 0.1 MG tablet Take 1 tablet (0.1 mg total) by mouth daily.   iron polysaccharides (NIFEREX) 150 MG capsule Take 1 capsule (150 mg total) by mouth as directed. TAKE 1 CAPSULE(150 MG) BY MOUTH 3 TIMES A WEEK Strength: 150 mg   LANREOTIDE ACETATE [place] Inject 1 Dose into the skin every 30 (thirty) days.   loperamide (IMODIUM A-D) 2 MG capsule Take 2 mg by mouth  daily as needed for diarrhea or loose stools.   mirtazapine (REMERON) 30 MG tablet Take 1 tablet (30 mg total) by mouth daily.   Multiple Vitamins-Minerals (PRESERVISION AREDS 2 PO) Take 1 capsule by mouth daily.   multivitamin (ONE-A-DAY MEN'S) TABS tablet Take 1 tablet by mouth daily.   Potassium Chloride ER 20 MEQ TBCR Take 1 tablet by mouth 2 (two) times daily.   SULFAMETHOXAZOLE-TRIMETHOPRIM PO Take 1 tablet by mouth 2 (two) times daily.   SYMBICORT 160-4.5 MCG/ACT inhaler Inhale 2 puffs into the lungs daily as needed (wheezing).   thiamine (VITAMIN B-1) 50 MG tablet Take 1 tablet (50 mg total) by  mouth daily.     Allergies:   Penicillins and Lisinopril   Social History   Socioeconomic History   Marital status: Married    Spouse name: Not on file   Number of children: Not on file   Years of education: Not on file   Highest education level: Not on file  Occupational History   Not on file  Tobacco Use   Smoking status: Some Days    Packs/day: 0.30    Years: 61.00    Total pack years: 18.30    Types: Cigars, Cigarettes    Start date: 31    Last attempt to quit: 03/03/2020    Years since quitting: 2.1   Smokeless tobacco: Never   Tobacco comments:    Former Risk manager Use: Never used  Substance and Sexual Activity   Alcohol use: Not Currently   Drug use: No   Sexual activity: Not Currently  Other Topics Concern   Not on file  Social History Narrative   Right handed    Lives with wife    Social Determinants of Health   Financial Resource Strain: Low Risk  (02/26/2022)   Overall Financial Resource Strain (CARDIA)    Difficulty of Paying Living Expenses: Not hard at all  Food Insecurity: No Food Insecurity (02/26/2022)   Hunger Vital Sign    Worried About Running Out of Food in the Last Year: Never true    Ran Out of Food in the Last Year: Never true  Transportation Needs: No Transportation Needs (02/26/2022)   PRAPARE - Hydrologist (Medical): No    Lack of Transportation (Non-Medical): No  Physical Activity: Inactive (02/26/2022)   Exercise Vital Sign    Days of Exercise per Week: 0 days    Minutes of Exercise per Session: 0 min  Stress: Stress Concern Present (02/26/2022)   [place]    Feeling of Stress : Very much  Social Connections: Socially Integrated (02/26/2022)   Social Connection and Isolation Panel [NHANES]    Frequency of Communication with Friends and Family: More than three times a week    Frequency of Social Gatherings with Friends and  Family: More than three times a week    Attends Religious Services: More than 4 times per year    Active Member of [employer] or Organizations: Yes    Attends Music therapist: More than 4 times per year    Marital Status: Married     Family History: The patient's family history includes Alzheimer's disease in his mother.  ROS:   Please see the history of present illness.    (+) Loss of Balance (+) Shortness of Breath All other systems reviewed and are negative.  EKGs/Labs/Other Studies Reviewed:  The following studies were reviewed today:  [place] Bilateral Carotid Dublex 01/20/2022:  Summary: Right Carotid: Velocities in the right ICA are consistent with a 1-39% stenosis. Left Carotid: Velocities in the left ICA are consistent with a 1-39% stenosis. Vertebrals: Bilateral vertebral arteries demonstrate antegrade flow. Subclavians: Normal flow hemodynamics were seen in bilateral subclavian arteries.  Monitor 03/28/2021: Sinus rhythm with average heart rate of 53 bpm Rare PVCs, 2% of of overall beats, rare PACs less than 1%. Minimum heart rate 33 bpm sinus rhythm at 8:11 AM on day #13-no symptoms associated with this. 7 beat run of slow ventricular tachycardia at 105 bpm at 4:40 AM, no symptoms reported Slow idioventricular rhythm heart rate in the 30s lasting 6 to 7 seconds in duration-asymptomatic, recorded during sleep 2-1 AV block 36 bpm 8:15 AM day #15 noted. Asymptomatic. Critical value reported.   Consider further evaluation by electrophysiology given underlying conduction disease.  LE Venous DVT 01/19/2021: Summary:  BILATERAL:  - No evidence of deep vein thrombosis seen in the lower extremities,  bilaterally.  - No evidence of superficial venous thrombosis in the lower extremities,  bilaterally.  -  RIGHT:  - No cystic structure found in the popliteal fossa.     LEFT:  - A cystic structure is found in the popliteal fossa.  Echo 01/17/2021:  1. Left  ventricular ejection fraction, by estimation, is 55 to 60%. The  left ventricle has normal function. The left ventricle has no regional  wall motion abnormalities. There is mild left ventricular hypertrophy of  the basal-septal segment. Left  ventricular diastolic parameters were normal.   2. Right ventricular systolic function is normal. The right ventricular  size is normal.   3. The mitral valve is normal in structure. No evidence of mitral valve  regurgitation. No evidence of mitral stenosis.   4. The aortic valve is tricuspid. Aortic valve regurgitation is not  visualized. No aortic stenosis is present.   5. The inferior vena cava is normal in size with greater than 50%  respiratory variability, suggesting right atrial pressure of 3 mmHg.   CTA Chest 01/16/2021: COMPARISON:  04/17/2010   FINDINGS: Cardiovascular: Atherosclerotic calcifications of the aorta are noted. No significant aortic opacification is identified to suggest dissection. No aneurysmal dilatation is noted. No cardiac enlargement is seen. Scattered coronary calcifications are noted. The pulmonary artery shows a normal branching pattern bilaterally. Tiny right middle lobe pulmonary emboli are seen. No other significant emboli are noted. No right heart strain is seen.   Mediastinum/Nodes: Thoracic inlet is within normal limits. No sizable hilar or mediastinal adenopathy is noted. The esophagus demonstrates some retained fluid and food stuffs. This may be related to reflux.   Lungs/Pleura: Lungs are well aerated bilaterally. Minimal dependent atelectatic changes are seen. No focal infiltrate or effusion is noted. No evidence of metastatic disease is seen.   Upper Abdomen: Visualized upper abdomen shows renal cystic change on the left as well as cysts within the left lobe of the liver. No other focal abnormality in the upper abdomen is seen.   Musculoskeletal: Degenerative changes of the thoracic spine are    Review of the MIP images confirms the above findings.   IMPRESSION: Small pulmonary emboli involving the right middle lobe as described.   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   Aortic Atherosclerosis (ICD10-I70.0).  EKG:  EKG is personally reviewed and interpreted. 04/23/2022: 02/02/2022: NSR 82 bpm, LVH, non specific T-wave abnormality 04/21/2021: EKG was not ordered.  Recent Labs: 02/03/2022: Magnesium 1.8 04/20/2022: ALT 44; BUN 46; Creatinine 2.76; Hemoglobin 10.1; Platelet Count 193; Potassium 4.5; Sodium 139   Recent Lipid Panel No results found for: "CHOL", "TRIG", "HDL", "CHOLHDL", "VLDL", "LDLCALC", "LDLDIRECT"   Risk Assessment/Calculations:          Physical Exam:    VS:  BP 120/80 (BP Location: Left Arm, Patient Position: Sitting, Cuff Size: Normal)   Pulse 82   Ht '6\' 1"'$  (1.854 m)   Wt 127 lb (57.6 kg)   SpO2 98%   BMI 16.76 kg/m     Wt Readings from Last 3 Encounters:  04/23/22 127 lb (57.6 kg)  04/21/22 120 lb (54.4 kg)  03/31/22 127 lb (57.6 kg)     GEN: Thin in no acute distress HEENT: Normal NECK: No JVD; No carotid bruits LYMPHATICS: No lymphadenopathy CARDIAC: RRR, no murmurs, rubs, gallops RESPIRATORY:  Clear to auscultation without rales, wheezing or rhonchi  ABDOMEN: Soft, non-tender, non-distended MUSCULOSKELETAL:  No edema; No deformity  SKIN: Warm and dry NEUROLOGIC:  Alert and oriented x 3 PSYCHIATRIC:  Normal affect   ASSESSMENT:    1. Atrial fibrillation with rapid ventricular response ([place])   2. Malignant carcinoid tumor of small intestine, unspecified location [facility])     PLAN:    In order of problems listed above:  AV block, 2nd degree, pacemaker Has been seen by Dr. Lovena Le in the past.  Pacemaker in place.  Continue to monitor.   Orthostatic hypotension Newest medicine is droxidopa.  This has been a Higher education careers adviser for him.  It is helped him significantly.  Unfortunately the cost of the medication now is over  $3000.  This is too much for him to be able to afford.  I will have our pharmacy team to help [place] with patient assistance potentially.  I have given them forms.  Continuing with Florinef. Midodrine did not work. He has tried compression stockings as well as abdominal binder. Echo normal EF.   Acute pulmonary embolism (HCC) Small PE noted in the hospital setting in July.  Not on anticoagulation due to higher risk versus benefit.  No evidence of DVT on lower extremity duplex.  Stable.   Malignant carcinoid tumor of small intestine [facility]) Per oncology team.  Longstanding issue.  Protein calorie malnutrition noted.  Follow-up: 4 months.  Medication Adjustments/Labs and Tests Ordered: Current medicines are reviewed at length with the patient today.  Concerns regarding medicines are outlined above.  No orders of the defined types were placed in this encounter.   No orders of the defined types were placed in this encounter.   Patient Instructions  Medication Instructions:  The current medical regimen is effective;  continue present plan and medications.  *If you need a refill on your cardiac medications before your next appointment, please call your pharmacy*  Follow-Up: At [facility], you and your health needs are our priority.  As part of our continuing mission to provide you with exceptional heart care, we have created designated Provider Care Teams.  These Care Teams include your primary Cardiologist (physician) and Advanced Practice Providers (APPs -  Physician Assistants and Nurse Practitioners) who all work together to provide you with the care you need, when you need it.  We recommend signing up for the patient portal called "MyChart".  Sign up information is provided on this After Visit Summary.  MyChart is used to connect with patients for Virtual Visits (Telemedicine).  Patients are able to view lab/test results, encounter  notes, upcoming appointments, etc.  Non-urgent  messages can be sent to your provider as well.   To learn more about what you can do with MyChart, go to [email or web address].    Your next appointment:   4 month(s)  The format for your next appointment:   In Person  Provider:   Dr Candee Furbish      Important Information About Sugar         I,Coren O'Brien,acting as a scribe for Candee Furbish, MD.,have documented all relevant documentation on the behalf of Candee Furbish, MD,as directed by  Candee Furbish, MD while in the presence of Candee Furbish, MD.  I, Candee Furbish, MD, have reviewed all documentation for this visit. The documentation on 04/23/22 for the exam, diagnosis, procedures, and orders are all accurate and complete.   Signed, Candee Furbish, MD  04/23/2022 5:03 PM    [place]

## 2022-04-24 ENCOUNTER — Inpatient Hospital Stay: Payer: Medicare Other

## 2022-04-24 LAB — CUP PACEART REMOTE DEVICE CHECK
Date Time Interrogation Session: 20231027072044
Implantable Lead Connection Status: 753985
Implantable Lead Connection Status: 753985
Implantable Lead Implant Date: 20221026
Implantable Lead Implant Date: 20221026
Implantable Lead Location: 753859
Implantable Lead Location: 753860
Implantable Lead Model: 377169
Implantable Lead Model: 377169
Implantable Lead Serial Number: 8000563684
Implantable Lead Serial Number: 8000606646
Implantable Pulse Generator Implant Date: 20221026
Pulse Gen Model: 407145
Pulse Gen Serial Number: 70247401

## 2022-04-27 ENCOUNTER — Inpatient Hospital Stay: Payer: Medicare Other

## 2022-04-27 ENCOUNTER — Telehealth: Payer: Self-pay | Admitting: Pharmacist

## 2022-04-27 NOTE — Telephone Encounter (Signed)
PA for northera submitted. There was no clinical questions. Looks like their request for reimbursement was denied as the did not meet criteria to use out of network pharmacy. Unfortunately it does not look like their is a patient assistance program, so even if approved cost will still be very high.  Key: Alan [name]

## 2022-04-29 ENCOUNTER — Inpatient Hospital Stay: Payer: Medicare Other

## 2022-04-29 MED ORDER — MIRTAZAPINE 45 MG PO TABS: 45.0000 mg | ORAL_TABLET | Freq: Every day | ORAL | 3 refills | Status: AC

## 2022-04-29 NOTE — Telephone Encounter (Signed)
Droxidopa approved through 13/31/24- approved at a tier 2 copay. I called and spoke with patient wife. Advised to call insurance to confirm in network pharmacy. Also advised that I do believe patient will hit the coverage gap and medication will be very expensive while he pays through the gap. Patient's wife knows she can call back with any further questions. I have not been able to find any patient assistance options.I have reached out to [employer]) to see if they have any copay assistance.

## 2022-04-30 ENCOUNTER — Other Ambulatory Visit (HOSPITAL_COMMUNITY): Payer: Medicare Other

## 2022-05-01 ENCOUNTER — Inpatient Hospital Stay: Payer: Medicare Other

## 2022-05-04 ENCOUNTER — Ambulatory Visit: Payer: Self-pay

## 2022-05-04 NOTE — Progress Notes (Signed)
Remote pacemaker transmission.   

## 2022-05-04 NOTE — Patient Instructions (Signed)
Visit Information  Thank you for taking time to visit with me today. Please don't hesitate to contact me if I can be of assistance to you.   Following are the goals we discussed today:   Goals Addressed             This Visit's Progress    COMPLETED: Care Coordination Post Rehab       Care Coordination Interventions: Encouraged to continue to eat Healthy Provided positive feedback to patient and wife regarding management of health Discussed if going out of town to have important safe strategies such as have important contact numbers available, take all prescribed medications, wear mask, use wheelchair assistance program at the airport. Encouraged to contact [facility] and/or primary care provider if care coordination needs in the future.      If you are experiencing a Mental Health or [place] or need someone to talk to, please call the Suicide and Crisis Lifeline: 988  Patient verbalizes understanding of instructions and care plan provided today and agrees to view in [place]. Active MyChart status and patient understanding of how to access instructions and care plan via MyChart confirmed with patient.     Thea Silversmith, RN, MSN, BSN, [place] Coordinator [phone]

## 2022-05-04 NOTE — Patient Outreach (Signed)
  Care Coordination   Follow Up Visit Note   05/04/2022 Name: Georg Ang Diers MRN: [id number] DOB: 1941-06-25  Anders Hohmann Hulen is a 81 y.o. year old male who sees [name], MD for primary care. I spoke with  Josie Saunders Malina and spouse [name] Weckwerth by phone today.  What matters to the patients health and wellness today?  Mr. Tatsch reports he is doing, "pretty good. I think I am beginning to have a little confidence in that medicine now". Mrs. Wilcock states patient is not having any more episodes of falling or passing out. She also states his appetite is better. She reports she and patient would like to take a flight out of town and will discuss with primary care provider for his recommendation. Discuss case closure. Patient and Mrs. Grzesiak are in agreement. Patient to contact care coordinator if needs change.   Goals Addressed             This Visit's Progress    COMPLETED: Care Coordination Post Rehab       Care Coordination Interventions: Encouraged to continue to eat Healthy Provided positive feedback to patient and wife regarding management of health Discussed if going out of town to have important safe strategies such as have important contact numbers available, take all prescribed medications, wear mask, use wheelchair assistance program at the airport. Encouraged to contact [facility] and/or primary care provider if care coordination needs in the future.      SDOH assessments and interventions completed:  No  Care Coordination Interventions Activated:  Yes  Care Coordination Interventions:  Yes, provided   Follow up plan: No further intervention required.   Encounter Outcome:  Pt. Visit Completed   Thea Silversmith, RN, MSN, BSN, [place] Coordinator [phone]

## 2022-05-06 ENCOUNTER — Encounter: Payer: Self-pay | Admitting: Internal Medicine

## 2022-05-10 ENCOUNTER — Encounter: Payer: Self-pay | Admitting: Cardiology

## 2022-05-13 ENCOUNTER — Other Ambulatory Visit: Payer: Self-pay | Admitting: Internal Medicine

## 2022-05-13 DIAGNOSIS — G903 Multi-system degeneration of the autonomic nervous system: Secondary | ICD-10-CM

## 2022-05-15 ENCOUNTER — Other Ambulatory Visit: Payer: Self-pay

## 2022-05-15 DIAGNOSIS — C7A019 Malignant carcinoid tumor of the small intestine, unspecified portion: Secondary | ICD-10-CM

## 2022-05-18 ENCOUNTER — Inpatient Hospital Stay: Payer: Medicare Other | Attending: Hematology

## 2022-05-18 ENCOUNTER — Inpatient Hospital Stay: Payer: Medicare Other

## 2022-05-18 ENCOUNTER — Other Ambulatory Visit: Payer: Self-pay

## 2022-05-18 VITALS — BP 113/74 | HR 73 | Temp 97.7°F | Resp 16

## 2022-05-18 DIAGNOSIS — C7A019 Malignant carcinoid tumor of the small intestine, unspecified portion: Secondary | ICD-10-CM | POA: Insufficient documentation

## 2022-05-18 LAB — CBC WITH DIFFERENTIAL (CANCER CENTER ONLY)
Abs Immature Granulocytes: 0.02 10*3/uL (ref 0.00–0.07)
Basophils Absolute: 0 10*3/uL (ref 0.0–0.1)
Basophils Relative: 0 %
Eosinophils Absolute: 0 10*3/uL (ref 0.0–0.5)
Eosinophils Relative: 1 %
HCT: 28 % — ABNORMAL LOW (ref 39.0–52.0)
Hemoglobin: 9.9 g/dL — ABNORMAL LOW (ref 13.0–17.0)
Immature Granulocytes: 0 %
Lymphocytes Relative: 24 %
Lymphs Abs: 1.4 10*3/uL (ref 0.7–4.0)
MCH: 31.7 pg (ref 26.0–34.0)
MCHC: 35.4 g/dL (ref 30.0–36.0)
MCV: 89.7 fL (ref 80.0–100.0)
Monocytes Absolute: 0.5 10*3/uL (ref 0.1–1.0)
Monocytes Relative: 8 %
Neutro Abs: 3.9 10*3/uL (ref 1.7–7.7)
Neutrophils Relative %: 67 %
Platelet Count: 165 10*3/uL (ref 150–400)
RBC: 3.12 MIL/uL — ABNORMAL LOW (ref 4.22–5.81)
RDW: 14.2 % (ref 11.5–15.5)
WBC Count: 5.9 10*3/uL (ref 4.0–10.5)
nRBC: 0 % (ref 0.0–0.2)

## 2022-05-18 LAB — CMP (CANCER CENTER ONLY)
ALT: 63 U/L — ABNORMAL HIGH (ref 0–44)
AST: 40 U/L (ref 15–41)
Albumin: 4.1 g/dL (ref 3.5–5.0)
Alkaline Phosphatase: 58 U/L (ref 38–126)
Anion gap: 7 (ref 5–15)
BUN: 44 mg/dL — ABNORMAL HIGH (ref 8–23)
CO2: 23 mmol/L (ref 22–32)
Calcium: 8.9 mg/dL (ref 8.9–10.3)
Chloride: 113 mmol/L — ABNORMAL HIGH (ref 98–111)
Creatinine: 3.4 mg/dL (ref 0.61–1.24)
GFR, Estimated: 18 mL/min — ABNORMAL LOW (ref >60)
Glucose, Bld: 127 mg/dL — ABNORMAL HIGH (ref 70–99)
Potassium: 4.3 mmol/L (ref 3.5–5.1)
Sodium: 143 mmol/L (ref 135–145)
Total Bilirubin: 0.5 mg/dL (ref 0.3–1.2)
Total Protein: 7 g/dL (ref 6.5–8.1)

## 2022-05-18 LAB — IRON AND IRON BINDING CAPACITY (CC-WL,HP ONLY)
Iron: 88 ug/dL (ref 45–182)
Saturation Ratios: 41 % — ABNORMAL HIGH (ref 17.9–39.5)
TIBC: 217 ug/dL — ABNORMAL LOW (ref 250–450)
UIBC: 129 ug/dL (ref 117–376)

## 2022-05-18 MED ORDER — LANREOTIDE ACETATE 120 MG/0.5ML ~~LOC~~ SOLN
120.0000 mg | Freq: Once | SUBCUTANEOUS | Status: AC
  Administered 2022-05-18: 120 mg via SUBCUTANEOUS
  Filled 2022-05-18: qty 120

## 2022-05-18 NOTE — Progress Notes (Unsigned)
CRITICAL VALUE STICKER  CRITICAL VALUE: Cr 3.40  DATE & TIME NOTIFIED: 05/18/22 3:50 pm  MD NOTIFIED: Irene Limbo  TIME OF NOTIFICATION:4:00 pm  RESPONSE:  Contacted pt , encouraged to increase water intake and to follow-up with Renal . Pt's Cr ranging from 2.65 - 3.40. Pt having MRI on kidney tomorrow and renal will follow.

## 2022-05-19 ENCOUNTER — Ambulatory Visit (HOSPITAL_COMMUNITY)
Admission: RE | Admit: 2022-05-19 | Discharge: 2022-05-19 | Disposition: A | Discharge status: Home / Self Care | Payer: Medicare Other | Source: Ambulatory Visit | Attending: Urology | Admitting: Urology

## 2022-05-19 DIAGNOSIS — N281 Cyst of kidney, acquired: Secondary | ICD-10-CM | POA: Diagnosis not present

## 2022-05-19 DIAGNOSIS — M47816 Spondylosis without myelopathy or radiculopathy, lumbar region: Secondary | ICD-10-CM | POA: Diagnosis not present

## 2022-05-19 DIAGNOSIS — D4102 Neoplasm of uncertain behavior of left kidney: Secondary | ICD-10-CM | POA: Diagnosis not present

## 2022-05-19 DIAGNOSIS — C7A00 Malignant carcinoid tumor of unspecified site: Secondary | ICD-10-CM | POA: Diagnosis not present

## 2022-05-19 DIAGNOSIS — C787 Secondary malignant neoplasm of liver and intrahepatic bile duct: Secondary | ICD-10-CM | POA: Diagnosis not present

## 2022-05-19 LAB — FERRITIN: Ferritin: 106 ng/mL (ref 24–336)

## 2022-05-19 MED ORDER — GADOBUTROL 1 MMOL/ML IV SOLN
5.7000 mL | Freq: Once | INTRAVENOUS | Status: AC | PRN
  Administered 2022-05-19: 5.7 mL via INTRAVENOUS

## 2022-05-20 LAB — CHROMOGRANIN A: Chromogranin A (ng/mL): 1665 ng/mL — ABNORMAL HIGH (ref 0.0–101.8)

## 2022-05-25 DIAGNOSIS — N1832 Chronic kidney disease, stage 3b: Secondary | ICD-10-CM | POA: Diagnosis not present

## 2022-05-25 DIAGNOSIS — C7A019 Malignant carcinoid tumor of the small intestine, unspecified portion: Secondary | ICD-10-CM | POA: Diagnosis not present

## 2022-05-27 ENCOUNTER — Telehealth: Payer: Self-pay

## 2022-05-27 NOTE — Telephone Encounter (Signed)
Biotronik alert received for new onset AF 05/26/22 with duration of 29 minutes 40 seconds. Spoke to patients wife Alan [name], states patient stayed in bed most of the day yesterday feeling fatigue and overall not well. Advised recommend AF clinic to discuss if patient is candidate for [facility] and further follow up on AF with symptoms. Agreeable to plan. Advised someone will call with apt info. Voiced understanding.

## 2022-05-29 ENCOUNTER — Inpatient Hospital Stay: Payer: Medicare Other | Attending: Hematology | Admitting: Hematology

## 2022-05-29 ENCOUNTER — Other Ambulatory Visit: Payer: Self-pay

## 2022-05-29 VITALS — BP 120/76 | HR 89 | Temp 97.7°F | Resp 17 | Ht 73.0 in | Wt 133.1 lb

## 2022-05-29 DIAGNOSIS — R0602 Shortness of breath: Secondary | ICD-10-CM | POA: Diagnosis not present

## 2022-05-29 DIAGNOSIS — I160 Hypertensive urgency: Secondary | ICD-10-CM | POA: Diagnosis not present

## 2022-05-29 DIAGNOSIS — Z888 Allergy status to other drugs, medicaments and biological substances status: Secondary | ICD-10-CM | POA: Diagnosis not present

## 2022-05-29 DIAGNOSIS — I5042 Chronic combined systolic (congestive) and diastolic (congestive) heart failure: Secondary | ICD-10-CM | POA: Diagnosis not present

## 2022-05-29 DIAGNOSIS — D631 Anemia in chronic kidney disease: Secondary | ICD-10-CM | POA: Diagnosis not present

## 2022-05-29 DIAGNOSIS — Z681 Body mass index (BMI) 19 or less, adult: Secondary | ICD-10-CM | POA: Diagnosis not present

## 2022-05-29 DIAGNOSIS — A419 Sepsis, unspecified organism: Secondary | ICD-10-CM | POA: Diagnosis not present

## 2022-05-29 DIAGNOSIS — I130 Hypertensive heart and chronic kidney disease with heart failure and stage 1 through stage 4 chronic kidney disease, or unspecified chronic kidney disease: Secondary | ICD-10-CM | POA: Diagnosis not present

## 2022-05-29 DIAGNOSIS — Z950 Presence of cardiac pacemaker: Secondary | ICD-10-CM | POA: Diagnosis not present

## 2022-05-29 DIAGNOSIS — I441 Atrioventricular block, second degree: Secondary | ICD-10-CM | POA: Diagnosis not present

## 2022-05-29 DIAGNOSIS — N179 Acute kidney failure, unspecified: Secondary | ICD-10-CM | POA: Diagnosis not present

## 2022-05-29 DIAGNOSIS — J189 Pneumonia, unspecified organism: Secondary | ICD-10-CM | POA: Diagnosis not present

## 2022-05-29 DIAGNOSIS — Z79899 Other long term (current) drug therapy: Secondary | ICD-10-CM | POA: Diagnosis not present

## 2022-05-29 DIAGNOSIS — C7A019 Malignant carcinoid tumor of the small intestine, unspecified portion: Secondary | ICD-10-CM | POA: Diagnosis not present

## 2022-05-29 DIAGNOSIS — N184 Chronic kidney disease, stage 4 (severe): Secondary | ICD-10-CM | POA: Diagnosis not present

## 2022-05-29 DIAGNOSIS — Z1152 Encounter for screening for COVID-19: Secondary | ICD-10-CM | POA: Diagnosis not present

## 2022-05-29 DIAGNOSIS — J45909 Unspecified asthma, uncomplicated: Secondary | ICD-10-CM | POA: Diagnosis not present

## 2022-05-29 DIAGNOSIS — I480 Paroxysmal atrial fibrillation: Secondary | ICD-10-CM | POA: Diagnosis not present

## 2022-05-29 DIAGNOSIS — F17210 Nicotine dependence, cigarettes, uncomplicated: Secondary | ICD-10-CM | POA: Diagnosis not present

## 2022-05-29 DIAGNOSIS — E43 Unspecified severe protein-calorie malnutrition: Secondary | ICD-10-CM | POA: Diagnosis not present

## 2022-05-29 DIAGNOSIS — J439 Emphysema, unspecified: Secondary | ICD-10-CM | POA: Diagnosis not present

## 2022-05-29 DIAGNOSIS — D500 Iron deficiency anemia secondary to blood loss (chronic): Secondary | ICD-10-CM | POA: Diagnosis not present

## 2022-05-29 DIAGNOSIS — G903 Multi-system degeneration of the autonomic nervous system: Secondary | ICD-10-CM | POA: Diagnosis not present

## 2022-05-29 DIAGNOSIS — K529 Noninfective gastroenteritis and colitis, unspecified: Secondary | ICD-10-CM | POA: Diagnosis not present

## 2022-05-29 DIAGNOSIS — E860 Dehydration: Secondary | ICD-10-CM | POA: Diagnosis not present

## 2022-05-29 NOTE — Progress Notes (Signed)
HEMATOLOGY/ONCOLOGY CLINIC NOTE  Date of Service: 05/29/22    Patient Care Team: [name], MD as PCP - General (Internal Medicine) Sueanne Margarita, MD as PCP - Cardiology (Cardiology) Constance Haw, MD as PCP - Electrophysiology (Cardiology) Brunetta Genera, MD as Consulting Physician (Hematology) Pickenpack-Cousar, Carlena Sax, NP as Nurse Practitioner (Nurse Practitioner) Cameron Sprang, MD as Consulting Physician (Neurology) Lisabeth Pick, MD as Consulting Physician (Ophthalmology)  CHIEF COMPLAINTS/PURPOSE OF CONSULTATION:  For continued evaluation and management of metastatic intestinal neuroendocrine tumor  HISTORY OF PRESENTING ILLNESS:  Please see previous note for details on initial presentation  Alan [name] is here for follow-up of his metastatic small intestinal carcinoid tumor.    Patient was last seen by me on 03/23/22. He continued to have diarrhea x4-5 times a day at that time but had no other toxicities from his lanreotide. He continues to use as needed loperamide as well as Lomotil to control his diarrhea. He had difficulties maintaining his p.o. intake and was also in the hospital for syncope and collapse on 2 occasions in early August requiring discharge to SNF. He was being managed by cardiology for his orthostatic hypotension. Given his limiting renal function and recurrent hospitalizations he has not been a candidate for Lutathera.  Today, he states that he has felt fatigued and generally weak. However, his blood pressures have been fluctuating the last few days. His wife states that she reduced his dose of Northera '100mg'$  TID to BID use as his BP has been higher. However, he notes accompanying lightheadedness and near syncope but no true syncope.  He reports diarrhea but this has been well controlled with Loperamide as well as Lomotil. The diarrhea has improved to 3-4 times a day and typically resolves by the second half of  the month. His wife has concerns that his monthly Lanreotide is causing his diarrhea to be worse at the beginning of the month. Patient is unsure that this is the case. He would like to continue the treatments at this time.  He drinks 48oz or so of water per day. His creatinine if 3.40 today.  He reports some tremors and reduced dexterity in his bilateral hands. He uses a walker or a wheelchair but can go some days without it depending on his energy level.  He took a trip to [place] recently and went into A-fib shortly after his trip.   MEDICAL HISTORY:  Past Medical History:  Diagnosis Date   Asthma    Carcinoid tumor, small intestine, malignant (HCC)    Chronic diarrhea    CKD (chronic kidney disease) stage 3, GFR 30-59 ml/min (HCC)    HTN (hypertension)    Short gut syndrome     SURGICAL HISTORY: Past Surgical History:  Procedure Laterality Date   COLON SURGERY     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   Procedure: PACEMAKER IMPLANT;  Surgeon: Constance Haw, MD;  Location: [facility];  Service: Cardiovascular;  Laterality: N/A;    SOCIAL HISTORY: Social History   Socioeconomic History   Marital status: Married    Spouse name: Not on file   Number of children: Not on file   Years of education: Not on file   Highest education level: Not on file  Occupational History   Not on file  Tobacco Use   Smoking status: Some Days    Packs/day: 0.30    Years: 61.00    Total pack years: 18.30  Types: Cigars, Cigarettes    Start date: 27    Last attempt to quit: 03/03/2020    Years since quitting: 2.2   Smokeless tobacco: Never   Tobacco comments:    Former Risk manager Use: Never used  Substance and Sexual Activity   Alcohol use: Not Currently   Drug use: No   Sexual activity: Not Currently  Other Topics Concern   Not on file  Social History Narrative   Right handed    Lives with wife    Social Determinants of Health   Financial  Resource Strain: Low Risk  (02/26/2022)   Overall Financial Resource Strain (CARDIA)    Difficulty of Paying Living Expenses: Not hard at all  Food Insecurity: No Food Insecurity (02/26/2022)   Hunger Vital Sign    Worried About Running Out of Food in the Last Year: Never true    Ran Out of Food in the Last Year: Never true  Transportation Needs: No Transportation Needs (02/26/2022)   PRAPARE - Hydrologist (Medical): No    Lack of Transportation (Non-Medical): No  Physical Activity: Inactive (02/26/2022)   Exercise Vital Sign    Days of Exercise per Week: 0 days    Minutes of Exercise per Session: 0 min  Stress: Stress Concern Present (02/26/2022)   [place]    Feeling of Stress : Very much  Social Connections: Socially Integrated (02/26/2022)   Social Connection and Isolation Panel [NHANES]    Frequency of Communication with Friends and Family: More than three times a week    Frequency of Social Gatherings with Friends and Family: More than three times a week    Attends Religious Services: More than 4 times per year    Active Member of [employer] or Organizations: Yes    Attends Music therapist: More than 4 times per year    Marital Status: Married  Human resources officer Violence: Not At Risk (02/26/2022)   Humiliation, Afraid, Rape, and Kick questionnaire    Fear of Current or Ex-Partner: No    Emotionally Abused: No    Physically Abused: No    Sexually Abused: No    FAMILY HISTORY: Family History  Problem Relation Age of Onset   Alzheimer's disease Mother     ALLERGIES:  is allergic to penicillins and lisinopril.  MEDICATIONS:  Current Outpatient Medications  Medication Sig Dispense Refill   albuterol (VENTOLIN HFA) 108 (90 Base) MCG/ACT inhaler Inhale 2 puffs into the lungs every 6 (six) hours as needed for wheezing or shortness of breath. 18 g 5   aspirin 81 MG EC tablet  Take 81 mg by mouth daily. Swallow whole.     diphenoxylate-atropine (LOMOTIL) 2.5-0.025 MG tablet Take 1 tablet by mouth 2 (two) times daily as needed for diarrhea or loose stools. 30 tablet 0   dronabinol (MARINOL) 5 MG capsule Take 1 capsule (5 mg total) by mouth 2 (two) times daily before a meal. 60 capsule 0   droxidopa (NORTHERA) 100 MG CAPS TAKE 1 CAPSULE BY MOUTH THREE TIMES DAILY WITH MEALS 90 capsule 0   feeding supplement, ENSURE COMPLETE, (ENSURE COMPLETE) LIQD Take 237 mLs by mouth 2 (two) times daily between meals.     finasteride (PROSCAR) 5 MG tablet Take 5 mg by mouth daily.     fludrocortisone (FLORINEF) 0.1 MG tablet Take 1 tablet (0.1 mg total) by mouth daily. [place]  tablet 2   iron polysaccharides (NIFEREX) 150 MG capsule Take 1 capsule (150 mg total) by mouth as directed. TAKE 1 CAPSULE(150 MG) BY MOUTH 3 TIMES A WEEK Strength: 150 mg 30 capsule 2   LANREOTIDE ACETATE [place] Inject 1 Dose into the skin every 30 (thirty) days.     loperamide (IMODIUM A-D) 2 MG capsule Take 2 mg by mouth daily as needed for diarrhea or loose stools.     mirtazapine (REMERON) 45 MG tablet Take 1 tablet (45 mg total) by mouth daily. 90 tablet 3   Multiple Vitamins-Minerals (PRESERVISION AREDS 2 PO) Take 1 capsule by mouth daily.     multivitamin (ONE-A-DAY MEN'S) TABS tablet Take 1 tablet by mouth daily.     Potassium Chloride ER 20 MEQ TBCR Take 1 tablet by mouth 2 (two) times daily. 60 tablet 5   SULFAMETHOXAZOLE-TRIMETHOPRIM PO Take 1 tablet by mouth 2 (two) times daily.     SYMBICORT 160-4.5 MCG/ACT inhaler Inhale 2 puffs into the lungs daily as needed (wheezing). 3 each 1   thiamine (VITAMIN B-1) 50 MG tablet Take 1 tablet (50 mg total) by mouth daily. 90 tablet 1   No current facility-administered medications for this visit.    REVIEW OF SYSTEMS:   10 Point review of Systems was done is negative except as noted above.  PHYSICAL EXAMINATION: ECOG PERFORMANCE STATUS: 3  Vitals:   05/29/22  1258  BP: 120/76  Pulse: 89  Resp: 17  Temp: 97.7 F (36.5 C)  SpO2: 100%    Filed Weights   05/29/22 1258  Weight: 133 lb 1.6 oz (60.4 kg)    GENERAL:alert, in no acute distress and comfortable SKIN: no acute rashes, no significant lesions EYES: conjunctiva are pink and non-injected, sclera anicteric OROPHARYNX: MMM, no exudates, no oropharyngeal erythema or ulceration NECK: supple, no JVD LYMPH:  no palpable lymphadenopathy in the cervical, axillary or inguinal regions LUNGS: clear to auscultation b/l with normal respiratory effort HEART: regular rate & rhythm ABDOMEN:  normoactive bowel sounds , non tender, not distended. Extremity: no pedal edema PSYCH: alert & oriented x 3 with fluent speech NEURO: no focal motor/sensory deficits   LABORATORY DATA:  I have reviewed the data as listed     Latest Ref Rng & Units 05/18/2022    2:54 PM 04/20/2022    3:02 PM 03/23/2022    1:33 PM  CBC  WBC 4.0 - 10.5 K/uL 5.9  6.9  6.1   Hemoglobin 13.0 - 17.0 g/dL 9.9  10.1  10.3   Hematocrit 39.0 - 52.0 % 28.0  28.7  29.7   Platelets 150 - 400 K/uL 165  193  171        Latest Ref Rng & Units 05/18/2022    2:54 PM 04/20/2022    3:02 PM 03/23/2022    1:33 PM  CMP  Glucose 70 - 99 mg/dL 127  118  108   BUN 8 - 23 mg/dL 44  46  56   Creatinine 0.61 - 1.24 mg/dL 3.40  2.76  2.90   Sodium 135 - 145 mmol/L 143  139  139   Potassium 3.5 - 5.1 mmol/L 4.3  4.5  4.5   Chloride 98 - 111 mmol/L 113  109  112   CO2 22 - 32 mmol/L '23  24  19   '$ Calcium 8.9 - 10.3 mg/dL 8.9  8.8  8.9   Total Protein 6.5 - 8.1 g/dL 7.0  6.6  7.3  Total Bilirubin 0.3 - 1.2 mg/dL 0.5  0.5  0.4   Alkaline Phos 38 - 126 U/L 58  52  53   AST 15 - 41 U/L 40  29  14   ALT 0 - 44 U/L 63  44  21    Iron/TIBC/Ferritin/ %Sat    Component Value Date/Time   IRON 88 05/18/2022 1454   TIBC 217 (L) 05/18/2022 1454   FERRITIN 106 05/18/2022 1454   IRONPCTSAT 41 (H) 05/18/2022 1454      RADIOGRAPHIC  STUDIES:  I have personally reviewed the radiological images as listed and agreed with the findings in the report.  ASSESSMENT & PLAN:   81 y.o. male with  #1 Metastatic small bowel malignant carcinoid with chronic significant diarrhea and weight loss. Patient is currently on lanreotide every 4 weeks  #2 s/p  severe hypokalemia -now resolved after aggressive potassium replacement. Likely related to his decreased p.o. intake, chronic diarrhea and the use of Florinef.  #3 Iron deficiency anemia #4 acute on chronic renal insufficiency. #5 cancer and cancer treatment related anorexia with weight loss. #6 depression and anxiety   PLAN: -Labs done today were discussed in detail with the patient -Reviewed elevated creatinine and encouraged increased hydration -No notable toxicities from lanreotide -We shall continue lanreotide every 4 weeks at this time and can increase the frequency to every 3 weeks if needed. -Continue loperamide and as needed Lomotil to control diarrhea -Continue follow-up with cardiology for management of orthostasis -Patient not a candidate for Lutathera at this time due to concerns for worsening kidney function and poor functional status.  Follow-up: -Continue lanreotide every 4 weeks x6  -Return to clinic with Dr. Irene Limbo with labs in 2 months   The total time spent in the appointment was 20 minutes* .  All of the patient's questions were answered with apparent satisfaction. The patient knows to call the clinic with any problems, questions or concerns.   I,Alexis Herring,acting as a Education administrator for Sullivan Lone, MD.,have documented all relevant documentation on the behalf of Sullivan Lone, MD,as directed by  Sullivan Lone, MD while in the presence of Sullivan Lone, MD.  .I have reviewed the above documentation for accuracy and completeness, and I agree with the above. Brunetta Genera MD   Sullivan Lone MD [place] AAHIVMS [facility] Hematology/Oncology Physician [facility]  .*Total Encounter Time as defined by the Centers for Medicare and Medicaid Services includes, in addition to the face-to-face time of a patient visit (documented in the note above) non-face-to-face time: obtaining and reviewing outside history, ordering and reviewing medications, tests or procedures, care coordination (communications with other health care professionals or caregivers) and documentation in the medical record.

## 2022-06-01 ENCOUNTER — Emergency Department (HOSPITAL_COMMUNITY): Payer: Medicare Other

## 2022-06-01 ENCOUNTER — Inpatient Hospital Stay (HOSPITAL_COMMUNITY)
Admission: EM | Admit: 2022-06-01 | Discharge: 2022-06-05 | DRG: 871 | Disposition: A | Discharge status: Home Health | Payer: Medicare Other | Attending: Internal Medicine | Admitting: Internal Medicine

## 2022-06-01 DIAGNOSIS — Z950 Presence of cardiac pacemaker: Secondary | ICD-10-CM

## 2022-06-01 DIAGNOSIS — D3A00 Benign carcinoid tumor of unspecified site: Secondary | ICD-10-CM | POA: Diagnosis not present

## 2022-06-01 DIAGNOSIS — J189 Pneumonia, unspecified organism: Secondary | ICD-10-CM | POA: Diagnosis present

## 2022-06-01 DIAGNOSIS — F17200 Nicotine dependence, unspecified, uncomplicated: Secondary | ICD-10-CM | POA: Diagnosis not present

## 2022-06-01 DIAGNOSIS — I499 Cardiac arrhythmia, unspecified: Secondary | ICD-10-CM | POA: Diagnosis not present

## 2022-06-01 DIAGNOSIS — I441 Atrioventricular block, second degree: Secondary | ICD-10-CM | POA: Diagnosis present

## 2022-06-01 DIAGNOSIS — E43 Unspecified severe protein-calorie malnutrition: Secondary | ICD-10-CM | POA: Diagnosis present

## 2022-06-01 DIAGNOSIS — Z820 Family history of epilepsy and other diseases of the nervous system: Secondary | ICD-10-CM | POA: Diagnosis not present

## 2022-06-01 DIAGNOSIS — N184 Chronic kidney disease, stage 4 (severe): Secondary | ICD-10-CM | POA: Diagnosis present

## 2022-06-01 DIAGNOSIS — D638 Anemia in other chronic diseases classified elsewhere: Secondary | ICD-10-CM | POA: Diagnosis present

## 2022-06-01 DIAGNOSIS — D500 Iron deficiency anemia secondary to blood loss (chronic): Secondary | ICD-10-CM | POA: Diagnosis present

## 2022-06-01 DIAGNOSIS — I160 Hypertensive urgency: Secondary | ICD-10-CM | POA: Diagnosis not present

## 2022-06-01 DIAGNOSIS — R6889 Other general symptoms and signs: Secondary | ICD-10-CM | POA: Diagnosis not present

## 2022-06-01 DIAGNOSIS — I5042 Chronic combined systolic (congestive) and diastolic (congestive) heart failure: Secondary | ICD-10-CM | POA: Diagnosis present

## 2022-06-01 DIAGNOSIS — I480 Paroxysmal atrial fibrillation: Secondary | ICD-10-CM | POA: Diagnosis present

## 2022-06-01 DIAGNOSIS — K529 Noninfective gastroenteritis and colitis, unspecified: Secondary | ICD-10-CM | POA: Diagnosis present

## 2022-06-01 DIAGNOSIS — Z79899 Other long term (current) drug therapy: Secondary | ICD-10-CM

## 2022-06-01 DIAGNOSIS — D649 Anemia, unspecified: Secondary | ICD-10-CM | POA: Diagnosis not present

## 2022-06-01 DIAGNOSIS — I130 Hypertensive heart and chronic kidney disease with heart failure and stage 1 through stage 4 chronic kidney disease, or unspecified chronic kidney disease: Secondary | ICD-10-CM | POA: Diagnosis present

## 2022-06-01 DIAGNOSIS — R531 Weakness: Secondary | ICD-10-CM

## 2022-06-01 DIAGNOSIS — F17210 Nicotine dependence, cigarettes, uncomplicated: Secondary | ICD-10-CM | POA: Diagnosis present

## 2022-06-01 DIAGNOSIS — I083 Combined rheumatic disorders of mitral, aortic and tricuspid valves: Secondary | ICD-10-CM | POA: Diagnosis not present

## 2022-06-01 DIAGNOSIS — K922 Gastrointestinal hemorrhage, unspecified: Secondary | ICD-10-CM | POA: Diagnosis not present

## 2022-06-01 DIAGNOSIS — D72829 Elevated white blood cell count, unspecified: Secondary | ICD-10-CM | POA: Diagnosis present

## 2022-06-01 DIAGNOSIS — D631 Anemia in chronic kidney disease: Secondary | ICD-10-CM | POA: Diagnosis present

## 2022-06-01 DIAGNOSIS — Z681 Body mass index (BMI) 19 or less, adult: Secondary | ICD-10-CM

## 2022-06-01 DIAGNOSIS — Z880 Allergy status to penicillin: Secondary | ICD-10-CM

## 2022-06-01 DIAGNOSIS — A419 Sepsis, unspecified organism: Principal | ICD-10-CM | POA: Diagnosis present

## 2022-06-01 DIAGNOSIS — Z1152 Encounter for screening for COVID-19: Secondary | ICD-10-CM

## 2022-06-01 DIAGNOSIS — N189 Chronic kidney disease, unspecified: Secondary | ICD-10-CM | POA: Diagnosis not present

## 2022-06-01 DIAGNOSIS — J439 Emphysema, unspecified: Secondary | ICD-10-CM | POA: Diagnosis not present

## 2022-06-01 DIAGNOSIS — C7A019 Malignant carcinoid tumor of the small intestine, unspecified portion: Secondary | ICD-10-CM | POA: Diagnosis not present

## 2022-06-01 DIAGNOSIS — R404 Transient alteration of awareness: Secondary | ICD-10-CM | POA: Diagnosis not present

## 2022-06-01 DIAGNOSIS — N179 Acute kidney failure, unspecified: Secondary | ICD-10-CM

## 2022-06-01 DIAGNOSIS — J154 Pneumonia due to other streptococci: Secondary | ICD-10-CM | POA: Diagnosis present

## 2022-06-01 DIAGNOSIS — K912 Postsurgical malabsorption, not elsewhere classified: Secondary | ICD-10-CM | POA: Diagnosis not present

## 2022-06-01 DIAGNOSIS — R7881 Bacteremia: Secondary | ICD-10-CM | POA: Diagnosis not present

## 2022-06-01 DIAGNOSIS — A403 Sepsis due to Streptococcus pneumoniae: Secondary | ICD-10-CM | POA: Diagnosis not present

## 2022-06-01 DIAGNOSIS — E860 Dehydration: Secondary | ICD-10-CM | POA: Diagnosis present

## 2022-06-01 DIAGNOSIS — J45909 Unspecified asthma, uncomplicated: Secondary | ICD-10-CM | POA: Diagnosis present

## 2022-06-01 DIAGNOSIS — R636 Underweight: Secondary | ICD-10-CM | POA: Diagnosis present

## 2022-06-01 DIAGNOSIS — G903 Multi-system degeneration of the autonomic nervous system: Secondary | ICD-10-CM | POA: Diagnosis present

## 2022-06-01 DIAGNOSIS — Z7951 Long term (current) use of inhaled steroids: Secondary | ICD-10-CM

## 2022-06-01 DIAGNOSIS — Z7952 Long term (current) use of systemic steroids: Secondary | ICD-10-CM

## 2022-06-01 DIAGNOSIS — Z7982 Long term (current) use of aspirin: Secondary | ICD-10-CM

## 2022-06-01 DIAGNOSIS — I4891 Unspecified atrial fibrillation: Secondary | ICD-10-CM | POA: Diagnosis not present

## 2022-06-01 DIAGNOSIS — R197 Diarrhea, unspecified: Secondary | ICD-10-CM | POA: Diagnosis not present

## 2022-06-01 DIAGNOSIS — I10 Essential (primary) hypertension: Secondary | ICD-10-CM | POA: Diagnosis not present

## 2022-06-01 DIAGNOSIS — N289 Disorder of kidney and ureter, unspecified: Secondary | ICD-10-CM | POA: Diagnosis not present

## 2022-06-01 DIAGNOSIS — Z743 Need for continuous supervision: Secondary | ICD-10-CM | POA: Diagnosis not present

## 2022-06-01 DIAGNOSIS — R0602 Shortness of breath: Secondary | ICD-10-CM | POA: Diagnosis not present

## 2022-06-01 DIAGNOSIS — Z888 Allergy status to other drugs, medicaments and biological substances status: Secondary | ICD-10-CM

## 2022-06-01 DIAGNOSIS — B955 Unspecified streptococcus as the cause of diseases classified elsewhere: Secondary | ICD-10-CM | POA: Diagnosis not present

## 2022-06-01 LAB — CBC
HCT: 22.2 % — ABNORMAL LOW (ref 39.0–52.0)
Hemoglobin: 8.2 g/dL — ABNORMAL LOW (ref 13.0–17.0)
MCH: 31.9 pg (ref 26.0–34.0)
MCHC: 36.9 g/dL — ABNORMAL HIGH (ref 30.0–36.0)
MCV: 86.4 fL (ref 80.0–100.0)
Platelets: 236 10*3/uL (ref 150–400)
RBC: 2.57 MIL/uL — ABNORMAL LOW (ref 4.22–5.81)
RDW: 13.7 % (ref 11.5–15.5)
WBC: 20.6 10*3/uL — ABNORMAL HIGH (ref 4.0–10.5)
nRBC: 0 % (ref 0.0–0.2)

## 2022-06-01 LAB — COMPREHENSIVE METABOLIC PANEL
ALT: 13 U/L (ref 0–44)
AST: 14 U/L — ABNORMAL LOW (ref 15–41)
Albumin: 2.4 g/dL — ABNORMAL LOW (ref 3.5–5.0)
Alkaline Phosphatase: 43 U/L (ref 38–126)
Anion gap: 8 (ref 5–15)
BUN: 74 mg/dL — ABNORMAL HIGH (ref 8–23)
CO2: 14 mmol/L — ABNORMAL LOW (ref 22–32)
Calcium: 7.9 mg/dL — ABNORMAL LOW (ref 8.9–10.3)
Chloride: 115 mmol/L — ABNORMAL HIGH (ref 98–111)
Creatinine, Ser: 4.42 mg/dL — ABNORMAL HIGH (ref 0.61–1.24)
GFR, Estimated: 13 mL/min — ABNORMAL LOW (ref >60)
Glucose, Bld: 144 mg/dL — ABNORMAL HIGH (ref 70–99)
Potassium: 3.6 mmol/L (ref 3.5–5.1)
Sodium: 137 mmol/L (ref 135–145)
Total Bilirubin: 0.7 mg/dL (ref 0.3–1.2)
Total Protein: 5.7 g/dL — ABNORMAL LOW (ref 6.5–8.1)

## 2022-06-01 LAB — HEMOGLOBIN AND HEMATOCRIT, BLOOD
HCT: 25.1 % — ABNORMAL LOW (ref 39.0–52.0)
Hemoglobin: 9.1 g/dL — ABNORMAL LOW (ref 13.0–17.0)

## 2022-06-01 LAB — PROCALCITONIN: Procalcitonin: 6.15 ng/mL

## 2022-06-01 LAB — RESP PANEL BY RT-PCR (FLU A&B, COVID) ARPGX2
Influenza A by PCR: NEGATIVE
Influenza B by PCR: NEGATIVE
SARS Coronavirus 2 by RT PCR: NEGATIVE

## 2022-06-01 MED ORDER — SODIUM CHLORIDE 0.9 % IV SOLN
500.0000 mg | INTRAVENOUS | Status: DC
  Administered 2022-06-01: 500 mg via INTRAVENOUS
  Filled 2022-06-01 (×2): qty 5

## 2022-06-01 MED ORDER — GUAIFENESIN-DM 100-10 MG/5ML PO SYRP: 10.0000 mL | ORAL_SOLUTION | ORAL | Status: DC | PRN

## 2022-06-01 MED ORDER — HYDROCOD POLI-CHLORPHE POLI ER 10-8 MG/5ML PO SUER: 5.0000 mL | Freq: Two times a day (BID) | ORAL | Status: DC | PRN

## 2022-06-01 MED ORDER — ONDANSETRON HCL 4 MG/2ML IJ SOLN: 4.0000 mg | Freq: Four times a day (QID) | INTRAMUSCULAR | Status: DC | PRN

## 2022-06-01 MED ORDER — HEPARIN SODIUM (PORCINE) 5000 UNIT/ML IJ SOLN
5000.0000 [IU] | Freq: Three times a day (TID) | INTRAMUSCULAR | Status: DC
  Administered 2022-06-01 – 2022-06-05 (×11): 5000 [IU] via SUBCUTANEOUS
  Filled 2022-06-01 (×11): qty 1

## 2022-06-01 MED ORDER — GUAIFENESIN ER 600 MG PO TB12
600.0000 mg | ORAL_TABLET | Freq: Two times a day (BID) | ORAL | Status: DC
  Administered 2022-06-01 – 2022-06-05 (×8): 600 mg via ORAL
  Filled 2022-06-01 (×9): qty 1

## 2022-06-01 MED ORDER — ENSURE ENLIVE PO LIQD
237.0000 mL | Freq: Two times a day (BID) | ORAL | Status: DC
  Administered 2022-06-02 – 2022-06-03 (×3): 237 mL via ORAL
  Filled 2022-06-01: qty 237

## 2022-06-01 MED ORDER — FAMOTIDINE 20 MG PO TABS: 20.0000 mg | ORAL_TABLET | Freq: Two times a day (BID) | ORAL | Status: DC

## 2022-06-01 MED ORDER — ONDANSETRON HCL 4 MG PO TABS: 4.0000 mg | ORAL_TABLET | Freq: Four times a day (QID) | ORAL | Status: DC | PRN

## 2022-06-01 MED ORDER — MOLNUPIRAVIR EUA 200MG CAPSULE: 4.0000 | ORAL_CAPSULE | Freq: Two times a day (BID) | ORAL | Status: DC

## 2022-06-01 MED ORDER — ACETAMINOPHEN 650 MG RE SUPP: 650.0000 mg | Freq: Four times a day (QID) | RECTAL | Status: DC | PRN

## 2022-06-01 MED ORDER — SODIUM CHLORIDE 0.9 % IV SOLN: INTRAVENOUS | Status: DC

## 2022-06-01 MED ORDER — MIRTAZAPINE 30 MG PO TABS: 45.0000 mg | ORAL_TABLET | Freq: Every day | ORAL | Status: DC

## 2022-06-01 MED ORDER — FINASTERIDE 5 MG PO TABS
5.0000 mg | ORAL_TABLET | Freq: Every day | ORAL | Status: DC
  Administered 2022-06-01 – 2022-06-05 (×5): 5 mg via ORAL
  Filled 2022-06-01 (×5): qty 1

## 2022-06-01 MED ORDER — DIPHENOXYLATE-ATROPINE 2.5-0.025 MG PO TABS: 1.0000 | ORAL_TABLET | Freq: Two times a day (BID) | ORAL | Status: DC | PRN

## 2022-06-01 MED ORDER — LEVOFLOXACIN IN D5W 750 MG/150ML IV SOLN: 750.0000 mg | Freq: Once | INTRAVENOUS | Status: DC

## 2022-06-01 MED ORDER — LOPERAMIDE HCL 2 MG PO CAPS
2.0000 mg | ORAL_CAPSULE | Freq: Every day | ORAL | Status: DC | PRN
  Administered 2022-06-04: 2 mg via ORAL
  Filled 2022-06-01: qty 1

## 2022-06-01 MED ORDER — SODIUM CHLORIDE 0.9 % IV BOLUS
30.0000 mL/kg | Freq: Once | INTRAVENOUS | Status: AC
  Administered 2022-06-01: 1800 mL via INTRAVENOUS

## 2022-06-01 MED ORDER — ACETAMINOPHEN 325 MG PO TABS: 650.0000 mg | ORAL_TABLET | Freq: Four times a day (QID) | ORAL | Status: DC | PRN

## 2022-06-01 MED ORDER — VITAMIN C 500 MG PO TABS: 500.0000 mg | ORAL_TABLET | Freq: Every day | ORAL | Status: DC

## 2022-06-01 MED ORDER — ZINC SULFATE 220 (50 ZN) MG PO CAPS: 220.0000 mg | ORAL_CAPSULE | Freq: Every day | ORAL | Status: DC

## 2022-06-01 MED ORDER — SODIUM CHLORIDE 0.9% FLUSH
3.0000 mL | Freq: Two times a day (BID) | INTRAVENOUS | Status: DC
  Administered 2022-06-02 – 2022-06-05 (×4): 3 mL via INTRAVENOUS

## 2022-06-01 MED ORDER — HYDRALAZINE HCL 20 MG/ML IJ SOLN
10.0000 mg | INTRAMUSCULAR | Status: DC | PRN
  Administered 2022-06-03: 10 mg via INTRAVENOUS
  Filled 2022-06-01: qty 1

## 2022-06-01 MED ORDER — FLUDROCORTISONE ACETATE 0.1 MG PO TABS
0.1000 mg | ORAL_TABLET | Freq: Every day | ORAL | Status: DC
  Administered 2022-06-01 – 2022-06-05 (×5): 0.1 mg via ORAL
  Filled 2022-06-01 (×5): qty 1

## 2022-06-01 MED ORDER — DROXIDOPA 100 MG PO CAPS
100.0000 mg | ORAL_CAPSULE | Freq: Three times a day (TID) | ORAL | Status: DC
  Administered 2022-06-02 – 2022-06-05 (×8): 100 mg via ORAL
  Filled 2022-06-01 (×12): qty 1

## 2022-06-01 MED ORDER — ALBUTEROL SULFATE HFA 108 (90 BASE) MCG/ACT IN AERS: 2.0000 | INHALATION_SPRAY | Freq: Four times a day (QID) | RESPIRATORY_TRACT | Status: DC

## 2022-06-01 MED ORDER — PANTOPRAZOLE SODIUM 40 MG IV SOLR
40.0000 mg | Freq: Two times a day (BID) | INTRAVENOUS | Status: DC
  Administered 2022-06-01 – 2022-06-05 (×8): 40 mg via INTRAVENOUS
  Filled 2022-06-01 (×8): qty 10

## 2022-06-01 MED ORDER — ALBUTEROL SULFATE (2.5 MG/3ML) 0.083% IN NEBU
2.5000 mg | INHALATION_SOLUTION | Freq: Four times a day (QID) | RESPIRATORY_TRACT | Status: DC
  Administered 2022-06-01 – 2022-06-03 (×5): 2.5 mg via RESPIRATORY_TRACT
  Filled 2022-06-01 (×5): qty 3

## 2022-06-01 MED ORDER — DRONABINOL 2.5 MG PO CAPS: 5.0000 mg | ORAL_CAPSULE | Freq: Two times a day (BID) | ORAL | Status: DC

## 2022-06-01 MED ORDER — SODIUM CHLORIDE 0.9 % IV SOLN
2.0000 g | INTRAVENOUS | Status: DC
  Administered 2022-06-01 – 2022-06-04 (×4): 2 g via INTRAVENOUS
  Filled 2022-06-01 (×4): qty 20

## 2022-06-01 MED ORDER — MOMETASONE FURO-FORMOTEROL FUM 200-5 MCG/ACT IN AERO
2.0000 | INHALATION_SPRAY | Freq: Two times a day (BID) | RESPIRATORY_TRACT | Status: DC
  Administered 2022-06-02 – 2022-06-04 (×5): 2 via RESPIRATORY_TRACT
  Filled 2022-06-01 (×2): qty 8.8

## 2022-06-01 MED ORDER — MIRTAZAPINE 15 MG PO TABS
45.0000 mg | ORAL_TABLET | Freq: Every day | ORAL | Status: DC
  Administered 2022-06-01 – 2022-06-04 (×4): 45 mg via ORAL
  Filled 2022-06-01: qty 1
  Filled 2022-06-01 (×3): qty 3

## 2022-06-01 MED ORDER — SODIUM CHLORIDE 0.9% FLUSH
3.0000 mL | Freq: Two times a day (BID) | INTRAVENOUS | Status: DC
  Administered 2022-06-02 – 2022-06-05 (×7): 3 mL via INTRAVENOUS

## 2022-06-01 MED ORDER — HEPARIN SODIUM (PORCINE) 5000 UNIT/ML IJ SOLN: 5000.0000 [IU] | Freq: Three times a day (TID) | INTRAMUSCULAR | Status: DC

## 2022-06-01 MED ORDER — THIAMINE MONONITRATE 100 MG PO TABS
100.0000 mg | ORAL_TABLET | Freq: Every day | ORAL | Status: DC
  Administered 2022-06-01 – 2022-06-05 (×5): 100 mg via ORAL
  Filled 2022-06-01 (×5): qty 1

## 2022-06-01 NOTE — ED Provider Notes (Signed)
[facility] EMERGENCY DEPARTMENT Provider Note  CSN: [id number] Arrival date & time: 06/01/22 1140  Chief Complaint(s) Shortness of Breath  HPI Alan [name] is a 81 y.o. male with history of CKD, hypertension, carcinoid tumor presenting to the emergency department with fatigue.  Patient reports he has had some weakness and shortness of breath for a while but worse in the past week.  He reports associated productive cough and weakness.  He reports the cough was worse yesterday.  He denies fevers or chills.  Symptoms are constant, and.  Past Medical History Past Medical History:  Diagnosis Date   Asthma    Carcinoid tumor, small intestine, malignant (HCC)    Chronic diarrhea    CKD (chronic kidney disease) stage 3, GFR 30-59 ml/min (HCC)    HTN (hypertension)    Short gut syndrome    Patient Active Problem List   Diagnosis Date Noted   CAP (community acquired pneumonia) 06/01/2022   Neurogenic orthostatic hypotension ([place]) 03/13/2022   Protein-calorie malnutrition, severe 02/05/2022   CKD (chronic kidney disease), stage IV ([place]) 02/02/2022   Paroxysmal atrial fibrillation ([place]) 01/31/2022   History of pulmonary embolism 01/31/2022   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/2022   Anemia due to acquired thiamine deficiency 12/10/2021   Oropharyngeal dysphagia 06/27/2021   Short gut syndrome    Major depressive disorder 04/22/2021   Sinus node dysfunction ([place]) 04/22/2021   Atrial fibrillation with rapid ventricular response ([place]) 04/22/2021   AV block, 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 Authorizing Provider  albuterol (VENTOLIN HFA) 108 (90 Base) MCG/ACT inhaler Inhale 2  puffs into the lungs every 6 (six) hours as needed for wheezing or shortness of breath. 01/28/22  Yes [name], MD  aspirin 81 MG EC tablet Take 81 mg by mouth daily. Swallow whole.   Yes [provider]  diphenoxylate-atropine (LOMOTIL) 2.5-0.025 MG tablet Take 1 tablet by mouth 2 (two) times daily as needed for diarrhea or loose stools. 11/26/21  Yes Brunetta Genera, MD  dronabinol (MARINOL) 5 MG capsule Take 1 capsule (5 mg total) by mouth 2 (two) times daily before a meal. 03/23/22  Yes Kale, Cloria [place], MD  droxidopa ([place]) 100 MG CAPS TAKE 1 CAPSULE BY MOUTH THREE TIMES DAILY WITH MEALS Patient taking differently: Take 100 mg by mouth 3 (three) times daily with meals. 05/13/22  Yes [name], MD  feeding supplement, ENSURE COMPLETE, (ENSURE COMPLETE) LIQD Take 237 mLs by mouth 2 (two) times daily between meals.   Yes [provider]  finasteride (PROSCAR) 5 MG tablet Take 5 mg by mouth daily. 02/25/22  Yes [provider]  fludrocortisone (FLORINEF) 0.1 MG tablet Take 1 tablet (0.1 mg total) by mouth daily. 03/13/22  Yes Jerline Pain, MD  iron polysaccharides (NIFEREX) 150 MG capsule Take 1 capsule (150 mg total) by mouth as directed. TAKE 1 CAPSULE(150 MG) BY MOUTH 3 TIMES A WEEK Strength: 150 mg 03/23/22  Yes Brunetta Genera, MD  LANREOTIDE ACETATE [place] Inject 1 Dose into the skin every 30 (thirty) days.   Yes [provider]  loperamide (IMODIUM A-D) 2 MG capsule Take 2 mg by mouth daily as needed for diarrhea or loose  stools.   Yes [provider]  mirtazapine (REMERON) 45 MG tablet Take 1 tablet (45 mg total) by mouth daily. 04/29/22 04/24/23 Yes Reed, Tiffany L, DO  Multiple Vitamins-Minerals (PRESERVISION AREDS 2 PO) Take 1 capsule by mouth daily.   Yes [provider]  multivitamin (ONE-A-DAY MEN'S) TABS tablet Take 1 tablet by mouth daily.   Yes [provider]  Potassium Chloride ER 20 MEQ TBCR Take 1  tablet by mouth 2 (two) times daily. 11/10/21  Yes Biagio Borg, MD  sulfamethoxazole-trimethoprim (BACTRIM DS) 800-160 MG tablet Take 1 tablet by mouth 2 (two) times daily. 03/03/22  Yes [provider]  SYMBICORT 160-4.5 MCG/ACT inhaler Inhale 2 puffs into the lungs daily as needed (wheezing). 01/28/22  Yes [name], MD  thiamine (VITAMIN B-1) 50 MG tablet Take 1 tablet (50 mg total) by mouth daily. 12/10/21  Yes [name], MD                                                                                                                                    Past Surgical History Past Surgical History:  Procedure Laterality Date   COLON SURGERY     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   Procedure: PACEMAKER IMPLANT;  Surgeon: Constance Haw, MD;  Location: [facility];  Service: Cardiovascular;  Laterality: N/A;   Family History Family History  Problem Relation Age of Onset   Alzheimer's disease Mother     Social History Social History   Tobacco Use   Smoking status: Some Days    Packs/day: 0.30    Years: 61.00    Total pack years: 18.30    Types: Cigars, Cigarettes    Start date: 60    Last attempt to quit: 03/03/2020    Years since quitting: 2.2   Smokeless tobacco: Never   Tobacco comments:    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   Physical Exam Vital Signs  I have reviewed the triage vital signs BP (!) 144/124   Pulse 76   Temp 97.6 F (36.4 C)   Resp 17   Ht '6\' 1"'$  (1.854 m)   Wt 60 kg   SpO2 95%   BMI 17.45 kg/m  Physical Exam Vitals and nursing note reviewed.  Constitutional:      Appearance: Normal appearance.     Comments: Frail, chronically ill-appearing  HENT:     Mouth/Throat:     Mouth: Mucous membranes are dry.   Eyes:     Conjunctiva/sclera: Conjunctivae normal.  Neck:     Vascular: No JVD.  Cardiovascular:     Rate and Rhythm: Normal rate and regular rhythm.  Pulmonary:     Effort: Pulmonary effort is normal. No respiratory distress.     Breath sounds: Rales (RUL) present.  Abdominal:     General: Abdomen is flat.     Palpations: Abdomen is soft.     Tenderness: There is no abdominal tenderness.  Musculoskeletal:     Right lower leg: No edema.     Left lower leg: No edema.  Skin:    General: Skin is warm and dry.     Capillary Refill: Capillary refill takes less than 2 seconds.  Neurological:     Mental Status: He is alert and oriented to person, place, and time. Mental status is at baseline.  Psychiatric:        Mood and Affect: Mood normal.        Behavior: Behavior normal.     ED Results and Treatments Labs (all labs ordered are listed, but only abnormal results are displayed) Labs Reviewed  COMPREHENSIVE METABOLIC PANEL - Abnormal; Notable for the following components:      Result Value   Chloride 115 (*)    CO2 14 (*)    Glucose, Bld 144 (*)    BUN 74 (*)    Creatinine, Ser 4.42 (*)    Calcium 7.9 (*)    Total Protein 5.7 (*)    Albumin 2.4 (*)    AST 14 (*)    GFR, Estimated 13 (*)    All other components within normal limits  CBC - Abnormal; Notable for the following components:   WBC 20.6 (*)    RBC 2.57 (*)    Hemoglobin 8.2 (*)    HCT 22.2 (*)    MCHC 36.9 (*)    All other components within normal limits  RESP PANEL BY RT-PCR (FLU A&B, COVID) ARPGX2  CULTURE, BLOOD (ROUTINE X 2)  CULTURE, BLOOD (ROUTINE X 2)  LACTIC ACID, PLASMA  LACTIC ACID, PLASMA                                                                                                                          Radiology CT Chest Wo Contrast  Result Date: 06/01/2022 CLINICAL DATA:  Pneumonia, complication suspected. EXAM: CT CHEST WITHOUT CONTRAST TECHNIQUE: Multidetector CT imaging of the  chest was performed following the standard protocol without IV contrast. RADIATION DOSE REDUCTION: This exam was performed according to the departmental dose-optimization program which includes automated exposure control, adjustment of the mA and/or kV according to patient size and/or use of iterative reconstruction technique. COMPARISON:  Chest radiograph performed earlier on the same date FINDINGS: Cardiovascular: The heart is normal in size. No pericardial effusion. Prominent coronary artery and aortic atherosclerotic calcifications. 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 Mediastinum/Nodes: No enlarged mediastinal or axillary lymph nodes. Thyroid gland, trachea, and esophagus demonstrate no significant findings. Lungs/Pleura: Large consolidation in the dependent portion of the right upper lobe with air bronchograms consistent with pneumonia. Emphysematous changes of the lungs. No significant  pleural effusion or pneumothorax. Upper Abdomen: Multiple large cysts in the upper pole of the left kidney. Musculoskeletal: No acute osseous abnormality. Thoracic spondylosis with dextroscoliosis. IMPRESSION: 1. Large consolidation in the dependent portion of the right upper lobe with air bronchograms consistent with pneumonia. No appreciable mass. Follow-up examination to resolution is recommended. 2. Emphysematous changes of the lungs. 3. Prominent coronary artery and aortic atherosclerotic calcifications. 4. Multiple large cysts in the upper pole of the left kidney. 5. Aortic atherosclerosis. Aortic Atherosclerosis (ICD10-I70.0) and Emphysema (ICD10-J43.9). Electronically Signed   By: Keane Police D.O.   On: 06/01/2022 14:00   DG Chest 1 View  Result Date: 06/01/2022 CLINICAL DATA:  Shortness of breath. EXAM: CHEST  1 VIEW COMPARISON:  02/02/2022 FINDINGS: Low volume film. Interval development of diffuse airspace disease in the right upper lobe, most suggestive of pneumonia. Minimal atelectasis noted  at the left lung base with possible tiny left effusion. Left permanent pacemaker again noted. IMPRESSION: Interval development of diffuse airspace disease in the right upper lobe, most suggestive of pneumonia. Close follow-up recommended to ensure resolution. CT chest could be used to exclude central obstructing mass lesion as clinically warranted. Electronically Signed   By: Misty Stanley M.D.   On: 06/01/2022 12:26    Pertinent labs & imaging results that were available during my care of the patient were reviewed by me and considered in my medical decision making (see MDM for details).  Medications Ordered in ED Medications  sodium chloride 0.9 % bolus 1,800 mL (has no administration in time range)                                                                                                                                     Procedures Procedures  (including critical care time)  Medical Decision Making / ED Course   MDM:  81 year old male presenting to the emergency department with fatigue.  Patient well-appearing but appears chronically ill and thin.  He appears mildly dehydrated.  He does have some crackles on his pulmonary exam.  Chest x-ray demonstrates right upper lobe lobar pneumonia.  CT chest confirms this.  Laboratory testing notable for acute kidney injury with leukocytosis.  Will give IV fluid bolus for AKI and dehydration.  Will give antibiotics for community-acquired pneumonia.  Will obtain blood cultures although patient not febrile.  Given significant weakness will admit the patient.  Low concern for alternative source of infection such as urinary tract infection or intra-abdominal infection.  Doubt alternative cause of pulmonary infiltrate is malignancy given radiographic findings correlate with bacterial pneumonia.  Clinical Course as of 06/01/22 1507  Mon Jun 01, 2022  1443 Discussed with the hospitalist Dr. Tamala Julian to admit the patient [WS]    Clinical Course User  Index [WS] Cristie Hem, MD     Additional history obtained: -Additional history obtained from family and ems -External records from outside source obtained and reviewed  including: Chart review including previous notes, labs, imaging, consultation notes including Cardiology note 04/23/22   Lab Tests: -I ordered, reviewed, and interpreted labs.   The pertinent results include:   Labs Reviewed  COMPREHENSIVE METABOLIC PANEL - Abnormal; Notable for the following components:      Result Value   Chloride 115 (*)    CO2 14 (*)    Glucose, Bld 144 (*)    BUN 74 (*)    Creatinine, Ser 4.42 (*)    Calcium 7.9 (*)    Total Protein 5.7 (*)    Albumin 2.4 (*)    AST 14 (*)    GFR, Estimated 13 (*)    All other components within normal limits  CBC - Abnormal; Notable for the following components:   WBC 20.6 (*)    RBC 2.57 (*)    Hemoglobin 8.2 (*)    HCT 22.2 (*)    MCHC 36.9 (*)    All other components within normal limits  RESP PANEL BY RT-PCR (FLU A&B, COVID) ARPGX2  CULTURE, BLOOD (ROUTINE X 2)  CULTURE, BLOOD (ROUTINE X 2)  LACTIC ACID, PLASMA  LACTIC ACID, PLASMA    Notable for acute kidney injury, leukocytosis  EKG   EKG Interpretation  Date/Time:  Monday June 01 2022 11:58:24 EST Ventricular Rate:  83 PR Interval:  162 QRS Duration: 110 QT Interval:  390 QTC Calculation: 458 R Axis:   84 Text Interpretation: Atrial-paced rhythm with frequent ventricular-paced complexes Nonspecific ST abnormality Abnormal ECG No previous ECGs available Confirmed by Garnette Gunner [phone]) on 06/01/2022 1:35:40 PM         Imaging Studies ordered: I ordered imaging studies including CT chest On my interpretation imaging demonstrates lobar pneumonia I independently visualized and interpreted imaging. I agree with the radiologist interpretation   Medicines ordered and prescription drug management: Meds ordered this encounter  Medications   DISCONTD: levofloxacin  (LEVAQUIN) IVPB 750 mg    Order Specific Question:   Antibiotic Indication:    Answer:   CAP   sodium chloride 0.9 % bolus 1,800 mL    -I have reviewed the patients home medicines and have made adjustments as needed   Consultations Obtained: I requested consultation with the hospitalist,  and discussed lab and imaging findings as well as pertinent plan - they recommend: admission   Social Determinants of Health:  Diagnosis or treatment significantly limited by social determinants of health: current smoker   Reevaluation: After the interventions noted above, I reevaluated the patient and found that they have improved  Co morbidities that complicate the patient evaluation  Past Medical History:  Diagnosis Date   Asthma    Carcinoid tumor, small intestine, malignant (HCC)    Chronic diarrhea    CKD (chronic kidney disease) stage 3, GFR 30-59 ml/min (HCC)    HTN (hypertension)    Short gut syndrome       Dispostion: Disposition decision including need for hospitalization was considered, and patient admitted to the hospital.    Final Clinical Impression(s) / ED Diagnoses Final diagnoses:  Acute kidney injury ([place])  Pneumonia of right upper lobe due to infectious organism     This chart was dictated using voice recognition software.  Despite best efforts to proofread,  errors can occur which can change the documentation meaning.    Cristie Hem, MD 06/01/22 [phone]

## 2022-06-01 NOTE — H&P (Addendum)
History and Physical    Patient: Alan [name]:[id number] DOB: 1941/01/31 DOA: 06/01/2022 DOS: the patient was seen and examined on 06/01/2022 PCP: [name], MD  Sueanne Margarita, MD as PCP - Cardiology (Cardiology) Constance Haw, MD as PCP - Electrophysiology (Cardiology) Brunetta Genera, MD as Consulting Physician (Hematology) Pickenpack-Cousar, Carlena Sax, NP as Nurse Practitioner (Nurse Practitioner) Cameron Sprang, MD as Consulting Physician (Neurology) Lisabeth Pick, MD as Consulting Physician (Ophthalmology)   Patient coming from: Home  Chief Complaint:  Chief Complaint  Patient presents with   Shortness of Breath   HPI: Alan [name] is a 81 y.o. male with medical history significant of hypertension, atrial fibrillation not on anticoagulation due to history of GI bleed, malignant carcinoid of the intestines, CKD stage III, and chronic diarrhea who presents with 3 to 4-day history of progressively worsening weakness and cough.  The cough has been intermittently productive.  Denies having any recent fever or chills.  He chronically has loose stools and abdominal discomfort related to the carcinoid.  Notes that they have intermittently been dark, and does admit to intermittently taking Aleve.  Since his last exam he reported weight gain of approximately 6 pounds.  Patient notes that he has no appetite and eats because he has to.  Patient reports his last colonoscopy was approximately a year and a half ago with Dr. Michail Sermon and he makes note that he is due for repeat exam next year.    In the emergency department patient was noted to be afebrile with vital signs maintained.  Labs significant for WBC 20.6, hemoglobin 8.2, BUN 74, and creatinine 4.42.  COVID-19 screening was negative.  CT scan of the chest noted a right upper lobe pneumonia.  Review of Systems: As mentioned in the history of present illness. All other systems reviewed and are negative. Past Medical  History:  Diagnosis Date   Asthma    Carcinoid tumor, small intestine, malignant (HCC)    Chronic diarrhea    CKD (chronic kidney disease) stage 3, GFR 30-59 ml/min (HCC)    HTN (hypertension)    Short gut syndrome    Past Surgical History:  Procedure Laterality Date   COLON SURGERY     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   Procedure: PACEMAKER IMPLANT;  Surgeon: Constance Haw, MD;  Location: [facility];  Service: Cardiovascular;  Laterality: N/A;   Social History:  reports that he has been smoking cigars and cigarettes. He started smoking about 63 years ago. He has a 18.30 pack-year smoking history. He has never used smokeless tobacco. He reports that he does not currently use alcohol. He reports that he does not use drugs.  Allergies  Allergen Reactions   Penicillins Hives and Shortness Of Breath   Lisinopril Itching    Family History  Problem Relation Age of Onset   Alzheimer's disease Mother     Prior to Admission medications   Medication Sig Start Date End Date Taking? Authorizing Provider  albuterol (VENTOLIN HFA) 108 (90 Base) MCG/ACT inhaler Inhale 2 puffs into the lungs every 6 (six) hours as needed for wheezing or shortness of breath. 01/28/22   [name], MD  aspirin 81 MG EC tablet Take 81 mg by mouth daily. Swallow whole.    [provider]  diphenoxylate-atropine (LOMOTIL) 2.5-0.025 MG tablet Take 1 tablet by mouth 2 (two) times daily as needed for diarrhea or loose stools. 11/26/21   Brunetta Genera, MD  dronabinol (MARINOL) 5 MG capsule Take 1 capsule (5 mg total) by mouth 2 (two) times daily before a meal. 03/23/22   Brunetta Genera, MD  droxidopa ([place]) 100 MG CAPS TAKE 1 CAPSULE BY MOUTH THREE TIMES DAILY WITH MEALS 05/13/22   [name], MD  feeding supplement, ENSURE COMPLETE, (ENSURE COMPLETE) LIQD Take 237 mLs by mouth 2 (two) times daily between meals.    [provider]  finasteride (PROSCAR) 5 MG tablet  Take 5 mg by mouth daily. 02/25/22   [provider]  fludrocortisone (FLORINEF) 0.1 MG tablet Take 1 tablet (0.1 mg total) by mouth daily. 03/13/22   Jerline Pain, MD  iron polysaccharides (NIFEREX) 150 MG capsule Take 1 capsule (150 mg total) by mouth as directed. TAKE 1 CAPSULE(150 MG) BY MOUTH 3 TIMES A WEEK Strength: 150 mg 03/23/22   Brunetta Genera, MD  LANREOTIDE ACETATE [place] Inject 1 Dose into the skin every 30 (thirty) days.    [provider]  loperamide (IMODIUM A-D) 2 MG capsule Take 2 mg by mouth daily as needed for diarrhea or loose stools.    [provider]  mirtazapine (REMERON) 45 MG tablet Take 1 tablet (45 mg total) by mouth daily. 04/29/22 04/24/23  Reed, Tiffany L, DO  Multiple Vitamins-Minerals (PRESERVISION AREDS 2 PO) Take 1 capsule by mouth daily.    [provider]  multivitamin (ONE-A-DAY MEN'S) TABS tablet Take 1 tablet by mouth daily.    [provider]  Potassium Chloride ER 20 MEQ TBCR Take 1 tablet by mouth 2 (two) times daily. 11/10/21   Biagio Borg, MD  SULFAMETHOXAZOLE-TRIMETHOPRIM PO Take 1 tablet by mouth 2 (two) times daily.    [provider]  SYMBICORT 160-4.5 MCG/ACT inhaler Inhale 2 puffs into the lungs daily as needed (wheezing). 01/28/22   [name], MD  thiamine (VITAMIN B-1) 50 MG tablet Take 1 tablet (50 mg total) by mouth daily. 12/10/21   [name], MD    Physical Exam: Vitals:   06/01/22 1148 06/01/22 1154  BP: (!) 144/124   Pulse: 76   Resp: 17   Temp: 97.6 F (36.4 C)   SpO2: 95%   Weight:  60 kg  Height:  '6\' 1"'$  (1.854 m)   Constitutional: Chronically ill and cachectic elderly male currently in no acute distress Eyes: PERRL, lids and conjunctivae normal ENMT: Mucous membranes are moist.   Neck: normal, supple, no masses, no thyromegaly Respiratory: Decreased overall aeration. No accessory muscle use.  Cardiovascular: Regular rate and rhythm,  PPM of left chest wall..  No extremity edema. 2+ pedal pulses. No carotid bruits.  Abdomen: no tenderness, no masses palpated.   Bowel sounds positive.  Musculoskeletal: no clubbing / cyanosis. No joint deformity upper and lower extremities. Good ROM, no contractures. Normal muscle tone.  Skin: no rashes, lesions, ulcers. No induration Neurologic: CN 2-12 grossly intact. Sensation intact, DTR normal. Strength 5/5 in all 4.  Psychiatric: Normal judgment and insight. Alert and oriented x 3. Normal mood.   Data Reviewed:  EKG revealed atrial paced rhythm at 83 bpm.  Reviewed labs, imaging and pertinent records as noted above in the HPI  Assessment and Plan:  Community-acquired pneumonia Acute.  Patient presents with complaints of generalized weakness and productive cough.  CT imaging of the chest notes a right upper lobe pneumonia.  Patient denies any episodes of vomiting. -Admit to a telemetry bed -Nasal cannula oxygen to maintain O2 saturation greater  than 92% -Incentive spirometry and flutter valve -Check procalcitonin -Continue empiric antibiotics of Rocephin and azithromycin -Mucinex  Leukocytosis Acute.  WBC elevated at 20.6.  Secondary to above. -Recheck CBC tomorrow morning  Anemia chronic kidney disease Acute on chronic.  Hemoglobin appears to have been slowly trending down from 10.1-> 9.9->8.2. -Type and screen for possible need of blood products -Check stool guaiac -Held aspirin -Protonix 40 mg IV twice daily -Transfuse blood products and consult gastroenterology if needed in a.m.  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  Baseline creatinine previously had been around 2.7-3.  Patient appears to be significantly dehydrated likely secondary to poor p.o. intake as well as diarrhea.  He had been given 1.8 L of IV fluids in the ED. -Continue normal saline IV fluids at 100 mL/h -Recheck kidney function in a.m.  Hypertensive  urgency Blood pressures initially elevated up into the 180s. -Hydralazine IV as needed for elevated blood pressures  Weakness Patient had been reportedly to get up this morning on its own. -PT to evaluate and treat  Carcinoid tumor of the intestines with history of chronic diarrhea -Continue current medication regimen with antidiarrheals -Continue outpatient follow-up with oncology  Paroxysmal atrial fibrillation Not on anticoagulation due to history of GI bleed  Underweight protein calorie malnutrition BMI 17.45 kg/m.  Albumin was noted to be 2.4 -Check prealbumin in a.m.   DVT prophylaxis: SCDs Advance Care Planning:   Code Status: Full Code   Consults: [facility]  Family Communication: Wife updated over the phone  Severity of Illness: The appropriate patient status for this patient is INPATIENT. Inpatient status is judged to be reasonable and necessary in order to provide the required intensity of service to ensure the patient's safety. The patient's presenting symptoms, physical exam findings, and initial radiographic and laboratory data in the context of their chronic comorbidities is felt to place them at high risk for further clinical deterioration. Furthermore, it is not anticipated that the patient will be medically stable for discharge from the hospital within 2 midnights of admission.   * I certify that at the point of admission it is my clinical judgment that the patient will require inpatient hospital care spanning beyond 2 midnights from the point of admission due to high intensity of service, high risk for further deterioration and high frequency of surveillance required.*  Author: Norval Morton, MD 06/01/2022 2:44 PM  For on call review [email or web address].

## 2022-06-01 NOTE — ED Triage Notes (Signed)
Increasing shortness of breath, productive cough, and generalized weakness x 1 week. 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 EMS reports near syncope on transfer to EMS stretcher. Alert and oriented x 4.   EMS VS: RR 30s HR 80 BP 154/89

## 2022-06-01 NOTE — ED Notes (Signed)
Patient transported to X-ray 

## 2022-06-02 ENCOUNTER — Other Ambulatory Visit: Payer: Self-pay

## 2022-06-02 ENCOUNTER — Encounter (HOSPITAL_COMMUNITY): Payer: Self-pay | Admitting: Internal Medicine

## 2022-06-02 DIAGNOSIS — J189 Pneumonia, unspecified organism: Secondary | ICD-10-CM | POA: Diagnosis not present

## 2022-06-02 DIAGNOSIS — R7881 Bacteremia: Secondary | ICD-10-CM | POA: Diagnosis not present

## 2022-06-02 LAB — BLOOD CULTURE ID PANEL (REFLEXED) - BCID2

## 2022-06-02 LAB — BASIC METABOLIC PANEL
Anion gap: 11 (ref 5–15)
BUN: 80 mg/dL — ABNORMAL HIGH (ref 8–23)
CO2: 14 mmol/L — ABNORMAL LOW (ref 22–32)
Calcium: 8.1 mg/dL — ABNORMAL LOW (ref 8.9–10.3)
Chloride: 112 mmol/L — ABNORMAL HIGH (ref 98–111)
Creatinine, Ser: 4.34 mg/dL — ABNORMAL HIGH (ref 0.61–1.24)
GFR, Estimated: 13 mL/min — ABNORMAL LOW (ref >60)
Glucose, Bld: 136 mg/dL — ABNORMAL HIGH (ref 70–99)
Potassium: 3.7 mmol/L (ref 3.5–5.1)
Sodium: 137 mmol/L (ref 135–145)

## 2022-06-02 LAB — PROCALCITONIN: Procalcitonin: 5.85 ng/mL

## 2022-06-02 LAB — CBC
HCT: 22.4 % — ABNORMAL LOW (ref 39.0–52.0)
Hemoglobin: 8.4 g/dL — ABNORMAL LOW (ref 13.0–17.0)
MCH: 31.6 pg (ref 26.0–34.0)
MCHC: 37.5 g/dL — ABNORMAL HIGH (ref 30.0–36.0)
MCV: 84.2 fL (ref 80.0–100.0)
Platelets: 222 10*3/uL (ref 150–400)
RBC: 2.66 MIL/uL — ABNORMAL LOW (ref 4.22–5.81)
RDW: 13.4 % (ref 11.5–15.5)
WBC: 24.6 10*3/uL — ABNORMAL HIGH (ref 4.0–10.5)
nRBC: 0 % (ref 0.0–0.2)

## 2022-06-02 LAB — PREALBUMIN: Prealbumin: 7 mg/dL — ABNORMAL LOW (ref 18–38)

## 2022-06-02 NOTE — ED Notes (Signed)
Received verbal report from [place] at this time

## 2022-06-02 NOTE — Progress Notes (Signed)
PHARMACY - PHYSICIAN COMMUNICATION CRITICAL VALUE ALERT - BLOOD CULTURE IDENTIFICATION (BCID)  Alan [name] is an 81 y.o. male who presented to [facility] on 06/01/2022 with a chief complaint of SOB  Assessment:  Blood cultures show 3/4 bottles with GPC/chains and BCID with strep Pn (no resistance)  Name of physician (or Provider) Contacted: Dr. Waldron [facility]  Current antibiotics: Rocephin, Azithromycin  Changes to prescribed antibiotics recommended:  -Continue rocephin, discontinue azithromycin  Results for orders placed or performed during the hospital encounter of 06/01/22  Blood Culture ID Panel (Reflexed) (Collected: 06/01/2022  3:48 PM)  Result Value Ref Range   Enterococcus faecalis NOT DETECTED NOT DETECTED   Enterococcus Faecium NOT DETECTED NOT DETECTED   Listeria monocytogenes NOT DETECTED NOT DETECTED   Staphylococcus species NOT DETECTED NOT DETECTED   Staphylococcus aureus (BCID) NOT DETECTED NOT DETECTED   Staphylococcus epidermidis NOT DETECTED NOT DETECTED   Staphylococcus lugdunensis NOT DETECTED NOT DETECTED   Streptococcus species DETECTED (A) NOT DETECTED   Streptococcus agalactiae NOT DETECTED NOT DETECTED   Streptococcus pneumoniae DETECTED (A) NOT DETECTED   Streptococcus pyogenes NOT DETECTED NOT DETECTED   A.calcoaceticus-baumannii NOT DETECTED NOT DETECTED   Bacteroides fragilis NOT DETECTED NOT DETECTED   Enterobacterales NOT DETECTED NOT DETECTED   Enterobacter cloacae complex NOT DETECTED NOT DETECTED   Escherichia coli NOT DETECTED NOT DETECTED   Klebsiella aerogenes NOT DETECTED NOT DETECTED   Klebsiella oxytoca NOT DETECTED NOT DETECTED   Klebsiella pneumoniae NOT DETECTED NOT DETECTED   Proteus species NOT DETECTED NOT DETECTED   Salmonella species NOT DETECTED NOT DETECTED   Serratia marcescens NOT DETECTED NOT DETECTED   Haemophilus influenzae NOT DETECTED NOT DETECTED   Neisseria meningitidis NOT DETECTED NOT DETECTED   Pseudomonas  aeruginosa NOT DETECTED NOT DETECTED   Stenotrophomonas maltophilia NOT DETECTED NOT DETECTED   Candida albicans NOT DETECTED NOT DETECTED   Candida auris NOT DETECTED NOT DETECTED   Candida glabrata NOT DETECTED NOT DETECTED   Candida krusei NOT DETECTED NOT DETECTED   Candida parapsilosis NOT DETECTED NOT DETECTED   Candida tropicalis NOT DETECTED NOT DETECTED   Cryptococcus neoformans/gattii NOT DETECTED NOT DETECTED   Hildred Laser, PharmD Clinical Pharmacist **Pharmacist phone directory can now be found on [email or web address] ([email or web address]).  Listed under [place].  e

## 2022-06-02 NOTE — Consult Note (Signed)
Referring Provider: Dr. Tamala Julian Primary Care Physician:  [name], MD Primary Gastroenterologist:  Dr. Michail Sermon  Reason for Consultation:  Anemia  HPI: Alan [name] is a 81 y.o. 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 Denies any black or red stools stating he had 1-2 dark brown stools a few months ago. Hgb 8.4 (9.1). Denies abdominal pain/N/V. EGD in 2022 showed gastritis. Colon in 06/2020 where 2 sessile serrated adenomas were removed and showed diverticulosis and internal hemorrhoids and repeat planned for January 2024. Wife at the bedside.  Past Medical History:  Diagnosis Date   Asthma    Carcinoid tumor, small intestine, malignant (HCC)    Chronic diarrhea    CKD (chronic kidney disease) stage 3, GFR 30-59 ml/min (HCC)    HTN (hypertension)    Short gut syndrome     Past Surgical History:  Procedure Laterality Date   COLON SURGERY     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   Procedure: PACEMAKER IMPLANT;  Surgeon: Constance Haw, MD;  Location: [facility];  Service: Cardiovascular;  Laterality: N/A;    Prior to Admission medications   Medication Sig Start Date End Date Taking? Authorizing Provider  acetaminophen (TYLENOL) 500 MG tablet Take 1,000 mg by mouth every 8 (eight) hours as needed for moderate pain or mild pain.   Yes [provider]  albuterol (VENTOLIN HFA) 108 (90 Base) MCG/ACT inhaler Inhale 2 puffs into the lungs every 6 (six) hours as needed for wheezing or shortness of breath. 01/28/22  Yes [name], MD  aspirin 81 MG EC tablet Take 81 mg by mouth daily. Swallow whole.   Yes [provider]  diphenoxylate-atropine (LOMOTIL) 2.5-0.025 MG tablet Take 1 tablet by mouth 2 (two) times daily as needed for diarrhea or loose stools. 11/26/21  Yes Brunetta Genera, MD  dronabinol (MARINOL) 5 MG capsule Take 1 capsule (5 mg total) by mouth 2  (two) times daily before a meal. 03/23/22  Yes Kale, Cloria [place], MD  droxidopa ([place]) 100 MG CAPS TAKE 1 CAPSULE BY MOUTH THREE TIMES DAILY WITH MEALS Patient taking differently: Take 100 mg by mouth 3 (three) times daily with meals. 05/13/22  Yes [name], MD  feeding supplement, ENSURE COMPLETE, (ENSURE COMPLETE) LIQD Take 237 mLs by mouth 2 (two) times daily between meals.   Yes [provider]  finasteride (PROSCAR) 5 MG tablet Take 5 mg by mouth daily. 02/25/22  Yes [provider]  fludrocortisone (FLORINEF) 0.1 MG tablet Take 1 tablet (0.1 mg total) by mouth daily. 03/13/22  Yes Jerline Pain, MD  iron polysaccharides (NIFEREX) 150 MG capsule Take 1 capsule (150 mg total) by mouth as directed. TAKE 1 CAPSULE(150 MG) BY MOUTH 3 TIMES A WEEK Strength: 150 mg 03/23/22  Yes Brunetta Genera, MD  LANREOTIDE ACETATE [place] Inject 1 Dose into the skin every 30 (thirty) days.   Yes [provider]  loperamide (IMODIUM A-D) 2 MG capsule Take 2 mg by mouth daily as needed for diarrhea or loose stools.   Yes [provider]  mirtazapine (REMERON) 45 MG tablet Take 1 tablet (45 mg total) by mouth daily. 04/29/22 04/24/23 Yes Reed, Tiffany L, DO  Multiple Vitamins-Minerals (PRESERVISION AREDS 2 PO) Take 1 capsule by mouth daily. With Multi-Vitamin all-in-one Tablet   Yes [provider]  naproxen sodium (ALEVE) 220 MG tablet Take 220 mg by mouth daily  as needed (pain).   Yes [provider]  Potassium Chloride ER 20 MEQ TBCR Take 1 tablet by mouth 2 (two) times daily. 11/10/21  Yes Biagio Borg, MD  sulfamethoxazole-trimethoprim (BACTRIM DS) 800-160 MG tablet Take 1 tablet by mouth 2 (two) times daily. 03/03/22  Yes [provider]  SYMBICORT 160-4.5 MCG/ACT inhaler Inhale 2 puffs into the lungs daily as needed (wheezing). 01/28/22  Yes [name], MD  thiamine (VITAMIN B-1) 50 MG tablet Take 1 tablet (50 mg total) by mouth daily.  12/10/21  Yes [name], MD    Scheduled Meds:  albuterol  2.5 mg Nebulization Q6H   dronabinol  5 mg Oral BID AC   droxidopa  100 mg Oral TID WC   feeding supplement  237 mL Oral BID BM   finasteride  5 mg Oral Daily   fludrocortisone  0.1 mg Oral Daily   guaiFENesin  600 mg Oral BID   heparin  5,000 Units Subcutaneous Q8H   mirtazapine  45 mg Oral QHS   mometasone-formoterol  2 puff Inhalation BID   pantoprazole (PROTONIX) IV  40 mg Intravenous Q12H   sodium chloride flush  3 mL Intravenous Q12H   sodium chloride flush  3 mL Intravenous Q12H   thiamine  100 mg Oral Daily   Continuous Infusions:  sodium chloride 75 mL/hr at 06/01/22 2014   azithromycin Stopped (06/01/22 2336)   cefTRIAXone (ROCEPHIN)  IV Stopped (06/01/22 2126)   PRN Meds:.acetaminophen **OR** acetaminophen, diphenoxylate-atropine, hydrALAZINE, loperamide, ondansetron **OR** ondansetron (ZOFRAN) IV  Allergies as of 06/01/2022 - Review Complete 06/01/2022  Allergen Reaction Noted   Penicillins Hives and Shortness Of Breath 01/21/2011   Lisinopril Itching 04/12/2012    Family History  Problem Relation Age of Onset   Alzheimer's disease Mother     Social History   Socioeconomic History   Marital status: Married    Spouse name: Not on file   Number of children: Not on file   Years of education: Not on file   Highest education level: Not on file  Occupational History   Not on file  Tobacco Use   Smoking status: Some Days    Packs/day: 0.30    Years: 61.00    Total pack years: 18.30    Types: Cigars, Cigarettes    Start date: 30    Last attempt to quit: 03/03/2020    Years since quitting: 2.2   Smokeless tobacco: Never   Tobacco comments:    Former Risk manager Use: Never used  Substance and Sexual Activity   Alcohol use: Not Currently   Drug use: No   Sexual activity: Not Currently  Other Topics Concern   Not on file  Social History Narrative   Right handed    Lives  with wife    Social Determinants of Health   Financial Resource Strain: Low Risk  (02/26/2022)   Overall Financial Resource Strain (CARDIA)    Difficulty of Paying Living Expenses: Not hard at all  Food Insecurity: No Food Insecurity (02/26/2022)   Hunger Vital Sign    Worried About Running Out of Food in the Last Year: Never true    Ran Out of Food in the Last Year: Never true  Transportation Needs: No Transportation Needs (02/26/2022)   PRAPARE - Hydrologist (Medical): No    Lack of Transportation (Non-Medical): No  Physical Activity: Inactive (02/26/2022)   Exercise Vital  Sign    Days of Exercise per Week: 0 days    Minutes of Exercise per Session: 0 min  Stress: Stress Concern Present (02/26/2022)   [place]    Feeling of Stress : Very much  Social Connections: Socially Integrated (02/26/2022)   Social Connection and Isolation Panel [NHANES]    Frequency of Communication with Friends and Family: More than three times a week    Frequency of Social Gatherings with Friends and Family: More than three times a week    Attends Religious Services: More than 4 times per year    Active Member of [employer] or Organizations: Yes    Attends Music therapist: More than 4 times per year    Marital Status: Married  Human resources officer Violence: Not At Risk (02/26/2022)   Humiliation, Afraid, Rape, and Kick questionnaire    Fear of Current or Ex-Partner: No    Emotionally Abused: No    Physically Abused: No    Sexually Abused: No    Review of Systems: All negative except as stated above in HPI.  Physical Exam: Vital signs: Vitals:   06/02/22 0336 06/02/22 0559  BP: 131/81 121/74  Pulse: 80 76  Resp: 19 20  Temp: 97.9 F (36.6 C)   SpO2: 99% 98%     General:   Lethargic, cachetic, pleasant, no acute distress  Head: normocephalic, atraumatic Eyes: anicteric sclera ENT: oropharynx  clear Neck: supple, nontender Lungs:  Clear throughout to auscultation.   No wheezes, crackles, or rhonchi. No acute distress. Heart:  Regular rate and rhythm; no murmurs, clicks, rubs,  or gallops. Abdomen: soft, nontender, nondistended, +BS  Rectal:  Deferred Ext: no edema  GI:  Lab Results: Recent Labs    06/01/22 1158 06/01/22 2020 06/02/22 0350  WBC 20.6*  --  24.6*  HGB 8.2* 9.1* 8.4*  HCT 22.2* 25.1* 22.4*  PLT 236  --  222   BMET Recent Labs    06/01/22 1158 06/02/22 0350  NA 137 137  K 3.6 3.7  CL 115* 112*  CO2 14* 14*  GLUCOSE 144* 136*  BUN 74* 80*  CREATININE 4.42* 4.34*  CALCIUM 7.9* 8.1*   LFT Recent Labs    06/01/22 1158  PROT 5.7*  ALBUMIN 2.4*  AST 14*  ALT 13  ALKPHOS 43  BILITOT 0.7   PT/INR No results for input(s): "LABPROT", "INR" in the last 72 hours.   Studies/Results: CT Chest Wo Contrast  Result Date: 06/01/2022 CLINICAL DATA:  Pneumonia, complication suspected. EXAM: CT CHEST WITHOUT CONTRAST TECHNIQUE: Multidetector CT imaging of the chest was performed following the standard protocol without IV contrast. RADIATION DOSE REDUCTION: This exam was performed according to the departmental dose-optimization program which includes automated exposure control, adjustment of the mA and/or kV according to patient size and/or use of iterative reconstruction technique. COMPARISON:  Chest radiograph performed earlier on the same date FINDINGS: Cardiovascular: The heart is normal in size. No pericardial effusion. Prominent coronary artery and aortic atherosclerotic calcifications. 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 Mediastinum/Nodes: No enlarged mediastinal or axillary lymph nodes. Thyroid gland, trachea, and esophagus demonstrate no significant findings. Lungs/Pleura: Large consolidation in the dependent portion of the right upper lobe with air bronchograms consistent with pneumonia. Emphysematous changes of the lungs. No  significant pleural effusion or pneumothorax. Upper Abdomen: Multiple large cysts in the upper pole of the left kidney. Musculoskeletal: No acute osseous abnormality. Thoracic spondylosis with dextroscoliosis.  IMPRESSION: 1. Large consolidation in the dependent portion of the right upper lobe with air bronchograms consistent with pneumonia. No appreciable mass. Follow-up examination to resolution is recommended. 2. Emphysematous changes of the lungs. 3. Prominent coronary artery and aortic atherosclerotic calcifications. 4. Multiple large cysts in the upper pole of the left kidney. 5. Aortic atherosclerosis. Aortic Atherosclerosis (ICD10-I70.0) and Emphysema (ICD10-J43.9). Electronically Signed   By: Keane Police D.O.   On: 06/01/2022 14:00   DG Chest 1 View  Result Date: 06/01/2022 CLINICAL DATA:  Shortness of breath. EXAM: CHEST  1 VIEW COMPARISON:  02/02/2022 FINDINGS: Low volume film. Interval development of diffuse airspace disease in the right upper lobe, most suggestive of pneumonia. Minimal atelectasis noted at the left lung base with possible tiny left effusion. Left permanent pacemaker again noted. IMPRESSION: Interval development of diffuse airspace disease in the right upper lobe, most suggestive of pneumonia. Close follow-up recommended to ensure resolution. CT chest could be used to exclude central obstructing mass lesion as clinically warranted. Electronically Signed   By: Misty Stanley M.D.   On: 06/01/2022 12:26    Impression/Plan: 81 yo with Stage IV carcinoid tumor and chronic diarrhea on Lanreotide admitted for pneumonia seen for a consult due to anemia without any overt bleeding. Suspect his anemia is multifactorial and no signs of a GI bleeding. Would hold off on endoscopic procedures and whether he needs a repeat surveillance colonoscopy in January will depend on his clinical status at that time. Will have him f/u with me in mid-late January to see whether to do another colonoscopy or  not. Will sign off. Call if questions.    LOS: 1 day   Lear Ng  06/02/2022, 10:26 AM  Questions please call [phone]

## 2022-06-02 NOTE — Evaluation (Signed)
Physical Therapy Evaluation Patient Details Name: Alan [name] MRN: [id number] DOB: 10-13-1940 Today's Date: 06/02/2022  History of Present Illness  Patient is a 81 y/o male who presents on 12/4 with SOB, productive cough and weakness. Found to have RUL PNA on CT scan. Also with anemia. PMH includes CKD, A-fib, pacemaker, PE, Stage IV carcinoid tumor, chronic diarrhea.  Clinical Impression  Patient presents with generalized weakness, dizziness, decreased activity tolerance and impaired mobility s/p above. Pt lives at home with his wife and uses RW vs rollator for ambulation and needs assist for ADls at baseline. Today, pt reports feeling sleepy due to not having slept in the ED all night. Requires min A for standing and short distance ambulation with use of RW. Limited by fatigue and dizziness (which is a chronic problem). Not able to get 02 reading due to poor perfusion but no SOB noted. Will consult mobility tech team to encourage and increase activity while in the hospital. Will follow acutely to maximize independence and mobility prior to return home.       Recommendations for follow up therapy are one component of a multi-disciplinary discharge planning process, led by the attending physician.  Recommendations may be updated based on patient status, additional functional criteria and insurance authorization.  Follow Up Recommendations Home health PT      Assistance Recommended at Discharge Frequent or constant Supervision/Assistance  Patient can return home with the following  A little help with walking and/or transfers;A little help with bathing/dressing/bathroom;Assist for transportation;Help with stairs or ramp for entrance    Equipment Recommendations None recommended by PT  Recommendations for Other Services       Functional Status Assessment Patient has had a recent decline in their functional status and demonstrates the ability to make significant improvements in function in a  reasonable and predictable amount of time.     Precautions / Restrictions Precautions Precautions: Fall;Other (comment) Precaution Comments: hx of orthostatic hypotension, dizzy spells Restrictions Weight Bearing Restrictions: No      Mobility  Bed Mobility Overal bed mobility: Needs Assistance Bed Mobility: Supine to Sit, Sit to Supine     Supine to sit: Supervision, HOB elevated Sit to supine: Supervision, HOB elevated   General bed mobility comments: increased time, use of rails.    Transfers Overall transfer level: Needs assistance Equipment used: Rolling walker (2 wheels) Transfers: Sit to/from Stand Sit to Stand: Min assist           General transfer comment: Min A to power and steady in standing from EOB x1. No dizziness initially.    Ambulation/Gait Ambulation/Gait assistance: Min assist Gait Distance (Feet): 20 Feet Assistive device: Rolling walker (2 wheels) Gait Pattern/deviations: Step-through pattern, Decreased stride length, Trunk flexed Gait velocity: decreased Gait velocity interpretation: <1.31 ft/sec, indicative of household ambulator   General Gait Details: Slow, unsteady gait with flexed posture at hips/knees/back, dizziness once walking, "i need to sit down." Not able to get 02 reading due to poor perfusion but no SOB noted.  Stairs            Wheelchair Mobility    Modified Rankin (Stroke Patients Only)       Balance Overall balance assessment: Needs assistance, History of Falls Sitting-balance support: Feet supported, No upper extremity supported Sitting balance-Leahy Scale: Good     Standing balance support: During functional activity, Reliant on assistive device for balance, Bilateral upper extremity supported Standing balance-Leahy Scale: Poor Standing balance comment: Requires UE support in standing.  Pertinent Vitals/Pain Pain Assessment Pain Assessment: No/denies pain     Home Living Family/patient expects to be discharged to:: Private residence Living Arrangements: Spouse/significant other;Children (42 year old child who works 3rd shift) Available Help at Discharge: Family;Available 24 hours/day Type of Home: House Home Access: Stairs to enter Entrance Stairs-Rails: Can reach both;Right;Left Entrance Stairs-Number of Steps: 5 vs none   Home Layout: One level Home Equipment: Shower seat - built in;Cane - single point;Wheelchair Probation officer (4 wheels);Rolling Walker (2 wheels);BSC/3in1;Grab bars - tub/shower Additional Comments: Pt recently returned from a trip to [place].    Prior Function Prior Level of Function : Needs assist             Mobility Comments: uses RW for household ambulation and rollator for community ambulation. Reports last fall in July. ADLs Comments: Needs some assist with ADLs- dressing and bathing at times. Not really by himself at home. Wife has hired some nursing students to assist her on an as needed basis.     Hand Dominance   Dominant Hand: Right    Extremity/Trunk Assessment   Upper Extremity Assessment Upper Extremity Assessment: Defer to OT evaluation (tingling in bil hands/fingers at times)    Lower Extremity Assessment Lower Extremity Assessment: Generalized weakness    Cervical / Trunk Assessment Cervical / Trunk Assessment: Kyphotic  Communication   Communication: No difficulties  Cognition Arousal/Alertness: Awake/alert, Lethargic Behavior During Therapy: Flat affect Overall Cognitive Status: Within Functional Limits for tasks assessed                                 General Comments: Pt sleepy upon arrival. Wife correcting some of the pt's answers to questions asked. Follows commands well. Aware of his dizzy spells and BP drop and states he finds somewhere to sit if symptoms arise.        General Comments General comments (skin integrity, edema, etc.): Wife present  during session.    Exercises     Assessment/Plan    PT Assessment Patient needs continued PT services  PT Problem List Decreased strength;Decreased mobility;Decreased balance;Decreased activity tolerance       PT Treatment Interventions Therapeutic exercise;Gait training;Balance training;Stair training;Functional mobility training;Therapeutic activities;Patient/family education    PT Goals (Current goals can be found in the Care Plan section)  Acute Rehab PT Goals Patient Stated Goal: to sleep and then go home PT Goal Formulation: With patient Time For Goal Achievement: 06/16/22 Potential to Achieve Goals: Fair    Frequency Min 3X/week     Co-evaluation               AM-PAC PT "6 Clicks" Mobility  Outcome Measure Help needed turning from your back to your side while in a flat bed without using bedrails?: A Little Help needed moving from lying on your back to sitting on the side of a flat bed without using bedrails?: A Little Help needed moving to and from a bed to a chair (including a wheelchair)?: A Little Help needed standing up from a chair using your arms (e.g., wheelchair or bedside chair)?: A Little Help needed to walk in hospital room?: A Little Help needed climbing 3-5 steps with a railing? : A Lot 6 Click Score: 17    End of Session Equipment Utilized During Treatment: Gait belt Activity Tolerance: Patient limited by fatigue;Other (comment) (dizziness - which is chronic) Patient left: in bed;with call bell/phone within reach;with family/visitor present  Nurse Communication: Mobility status PT Visit Diagnosis: Muscle weakness (generalized) (M62.81);Unsteadiness on feet (R26.81);Difficulty in walking, not elsewhere classified (R26.2)    Time: 0177-9390 PT Time Calculation (min) (ACUTE ONLY): 23 min   Charges:   PT Evaluation $PT Eval Moderate Complexity: 1 Mod PT Treatments $Therapeutic Activity: 8-22 mins        Marisa Severin, PT, DPT Acute  Rehabilitation Services Secure chat preferred Office [phone]     Marguarite Arbour A Estrellita Lasky 06/02/2022, 2:56 PM

## 2022-06-02 NOTE — Progress Notes (Signed)
PROGRESS NOTE    Alan [name]  TWS:[id number] DOB: 1941-03-05 DOA: 06/01/2022 PCP: [name], MD    Chief Complaint  Patient presents with   Shortness of Breath    Brief Narrative:  Alan [name] is a 81 y.o. male with medical history significant of hypertension, atrial fibrillation not on anticoagulation due to history of GI bleed, malignant carcinoid of the intestines, CKD stage III, and chronic diarrhea who presents with 3 to 4-day history of progressively worsening weakness and cough.  Workup significant for leukocytosis, active, CT chest noted for right upper lobe pneumonia.    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  Patient denies any episodes of vomiting. -Culture BC ID showing Streptococcus pneumonia, continue with IV Rocephin 2 g daily, will DC IV azithromycin -Nasal cannula oxygen to maintain O2 saturation greater than 92% -Incentive spirometry and flutter valve -Check procalcitonin -Continue empiric antibiotics of Rocephin and azithromycin -Mucinex   Leukocytosis Acute.  WBC elevated at 20.6.  Secondary to above. -Recheck CBC tomorrow morning   Anemia chronic kidney disease Acute on chronic.  Hemoglobin appears to have been slowly trending down from 10.1-> 9.9->8.2. -Type and screen for possible need of blood products -Check stool guaiac -Held aspirin -Protonix 40 mg IV twice daily -Transfuse blood products and consult gastroenterology if needed  in a.m.   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  Baseline creatinine previously had been around 2.7-3.  Patient appears to be significantly dehydrated likely secondary to poor p.o. intake as well as diarrhea.  He had been given 1.8 L of IV fluids in the ED. -Continue with IV fluids -Recheck kidney function in a.m.   Hypertensive urgency Blood pressures initially elevated up into the 180s. -Hydralazine IV as needed for elevated blood pressures   Weakness Patient had been reportedly to get up this morning on its own. -PT to evaluate and treat   Carcinoid tumor of the intestines with history of chronic diarrhea -Continue current medication regimen with antidiarrheals -Continue outpatient follow-up with oncology   Paroxysmal atrial fibrillation Not on anticoagulation due to history of GI bleed   Underweight protein calorie malnutrition BMI 17.45 kg/m.  Albumin was noted to be 2.4 -Start on supplements      DVT prophylaxis:  Code Status: Full Family Communication: None at bedside Disposition:   Status is: Inpatient    Consultants:  NONE   Subjective:  Reports cough, and weakness  Objective: Vitals:   06/02/22 0559 06/02/22 1028 06/02/22 1300 06/02/22 1406  BP: 121/74 137/81 133/78   Pulse: 76 71 68   Resp: '20 20 18   '$ Temp:   98.3 F (36.8 C)   TempSrc:   Oral   SpO2: 98% 99% 100% 98%  Weight:      Height:        Intake/Output Summary (Last 24 hours) at 06/02/2022 1609 Last data filed at 06/01/2022 2336 Gross per 24 hour  Intake 350 ml  Output --  Net 350 ml   Filed Weights   06/01/22 1154  Weight: 60 kg    Examination:  Awake Alert, Oriented X 3, extremely frail, chronically ill-appearing Symmetrical Chest wall movement, scattered Rales totally RRR,No Gallops,Rubs or new Murmurs, No Parasternal Heave +ve B.Sounds, Abd Soft, No tenderness, No rebound - guarding or rigidity. No  Cyanosis, Clubbing or edema, No new Rash or bruise      Data Reviewed: I have personally reviewed following labs and imaging studies  CBC: Recent Labs  Lab 06/01/22 1158 06/01/22 2020 06/02/22 0350  WBC 20.6*  --  24.6*  HGB 8.2* 9.1* 8.4*  HCT 22.2* 25.1* 22.4*  MCV 86.4  --  84.2  PLT 236  --  301    Basic Metabolic Panel: Recent Labs  Lab 06/01/22 1158 06/02/22 0350  NA 137 137  K 3.6 3.7  CL 115* 112*  CO2 14* 14*  GLUCOSE 144* 136*  BUN 74* 80*  CREATININE 4.42* 4.34*  CALCIUM 7.9* 8.1*    GFR: Estimated Creatinine Clearance: 11.5 mL/min (A) (by C-G formula based on SCr of 4.34 mg/dL (H)).  Liver Function Tests: Recent Labs  Lab 06/01/22 1158  AST 14*  ALT 13  ALKPHOS 43  BILITOT 0.7  PROT 5.7*  ALBUMIN 2.4*    CBG: No results for input(s): "GLUCAP" in the last 168 hours.   Recent Results (from the past 240 hour(s))  Resp Panel by RT-PCR (Flu A&B, Covid) Anterior Nasal Swab     Status: None   Collection Time: 06/01/22 11:58 AM   Specimen: Anterior Nasal Swab  Result Value Ref Range Status   SARS Coronavirus 2 by RT PCR NEGATIVE NEGATIVE Final    Comment: (NOTE) SARS-CoV-2 target nucleic acids are NOT DETECTED.  The SARS-CoV-2 RNA is generally detectable in upper respiratory specimens during the acute phase of infection. The lowest concentration of SARS-CoV-2 viral copies this assay can detect is 138 copies/mL. A negative result does not preclude SARS-Cov-2 infection and should not be used as the sole basis for treatment or other patient management decisions. A negative result may occur with  improper specimen collection/handling, submission of specimen other than nasopharyngeal swab, presence of viral mutation(s) within the areas targeted by this assay, and inadequate number of viral copies(<138 copies/mL). A negative result must be combined with clinical observations, patient history, and epidemiological information. The expected result  is Negative.  Fact Sheet for Patients:  EntrepreneurPulse.com.au  Fact Sheet for Healthcare Providers:  IncredibleEmployment.be  This test is no t yet approved or cleared by the [place] FDA and  has been authorized for detection and/or diagnosis of SARS-CoV-2 by FDA under an Emergency Use Authorization (EUA). This EUA will remain  in effect (meaning this test can be used) for the duration of the COVID-19 declaration under Section 564(b)(1) of the Act, 21 U.S.C.section 360bbb-3(b)(1), unless the authorization is terminated  or revoked sooner.       Influenza A by PCR NEGATIVE NEGATIVE Final   Influenza B by PCR NEGATIVE NEGATIVE Final    Comment: (NOTE) The Xpert Xpress SARS-CoV-2/FLU/RSV plus assay is intended as an aid in the diagnosis of influenza from Nasopharyngeal swab specimens and should not be used as a sole basis for treatment. Nasal washings and aspirates are unacceptable for Xpert Xpress SARS-CoV-2/FLU/RSV testing.  Fact Sheet for Patients: EntrepreneurPulse.com.au  Fact Sheet for Healthcare Providers: IncredibleEmployment.be  This test is not yet approved or cleared by the [place] FDA and has  been authorized for detection and/or diagnosis of SARS-CoV-2 by FDA under an Emergency Use Authorization (EUA). This EUA will remain in effect (meaning this test can be used) for the duration of the COVID-19 declaration under Section 564(b)(1) of the Act, 21 U.S.C. section 360bbb-3(b)(1), unless the authorization is terminated or revoked.  Performed at [facility], [place] [address], [place], [place] [address]   Culture, blood (routine x 2)     Status: None (Preliminary result)   Collection Time: 06/01/22  3:48 PM   Specimen: BLOOD LEFT FOREARM  Result Value Ref Range Status   Specimen Description BLOOD LEFT FOREARM  Final   Special Requests   Final    BOTTLES DRAWN AEROBIC AND  ANAEROBIC Blood Culture adequate volume   Culture  Setup Time   Final    GRAM POSITIVE COCCI IN CHAINS AEROBIC BOTTLE ONLY CRITICAL RESULT CALLED TO, READ BACK BY AND VERIFIED WITH: [place] ON 06/02/22 @ 1538 BY DRT Performed at [place] [facility], [place] [address] [place], [place] [address]    Culture GRAM POSITIVE COCCI  Final   Report Status PENDING  Incomplete  Blood Culture ID Panel (Reflexed)     Status: Abnormal   Collection Time: 06/01/22  3:48 PM  Result Value Ref Range Status   Enterococcus faecalis NOT DETECTED NOT DETECTED Final   Enterococcus Faecium NOT DETECTED NOT DETECTED Final   Listeria monocytogenes NOT DETECTED NOT DETECTED Final   Staphylococcus species NOT DETECTED NOT DETECTED Final   Staphylococcus aureus (BCID) NOT DETECTED NOT DETECTED Final   Staphylococcus epidermidis NOT DETECTED NOT DETECTED Final   Staphylococcus lugdunensis NOT DETECTED NOT DETECTED Final   Streptococcus species DETECTED (A) NOT DETECTED Final    Comment: CRITICAL RESULT CALLED TO, READ BACK BY AND VERIFIED WITH: PHARMD ANDREW MEYER ON 06/02/22 @ 1538 BY DRT    Streptococcus agalactiae NOT DETECTED NOT DETECTED Final   Streptococcus pneumoniae DETECTED (A) NOT DETECTED Final    Comment: CRITICAL RESULT CALLED TO, READ BACK BY AND VERIFIED WITH: PHARMD ANDREW MEYER ON 06/02/22 @ 1538 BY DRT    Streptococcus pyogenes NOT DETECTED NOT DETECTED Final   A.calcoaceticus-baumannii NOT DETECTED NOT DETECTED Final   Bacteroides fragilis NOT DETECTED NOT DETECTED Final   Enterobacterales NOT DETECTED NOT DETECTED Final   Enterobacter cloacae complex NOT DETECTED NOT DETECTED Final   Escherichia coli NOT DETECTED NOT DETECTED Final   Klebsiella aerogenes NOT DETECTED NOT DETECTED Final   Klebsiella oxytoca NOT DETECTED NOT DETECTED Final   Klebsiella pneumoniae NOT DETECTED NOT DETECTED Final   Proteus species NOT DETECTED NOT DETECTED Final   Salmonella species NOT DETECTED NOT  DETECTED Final   Serratia marcescens NOT DETECTED NOT DETECTED Final   Haemophilus influenzae NOT DETECTED NOT DETECTED Final   Neisseria meningitidis NOT DETECTED NOT DETECTED Final   Pseudomonas aeruginosa NOT DETECTED NOT DETECTED Final   Stenotrophomonas maltophilia NOT DETECTED NOT DETECTED Final   Candida albicans NOT DETECTED NOT DETECTED Final   Candida auris NOT DETECTED NOT DETECTED Final   Candida glabrata NOT DETECTED NOT DETECTED Final   Candida krusei NOT DETECTED NOT DETECTED Final   Candida parapsilosis NOT DETECTED NOT DETECTED Final   Candida tropicalis NOT DETECTED NOT DETECTED Final   Cryptococcus neoformans/gattii NOT DETECTED NOT DETECTED Final    Comment: Performed at [facility], [place] [address] [place], [place] [address]  Culture, blood (routine x 2)     Status: None (Preliminary  result)   Collection Time: 06/01/22  4:00 PM   Specimen: BLOOD  Result Value Ref Range Status   Specimen Description BLOOD LEFT ANTECUBITAL  Final   Special Requests   Final    BOTTLES DRAWN AEROBIC AND ANAEROBIC Blood Culture results may not be optimal due to an inadequate volume of blood received in culture bottles   Culture  Setup Time   Final    GRAM POSITIVE COCCI IN CHAINS IN BOTH AEROBIC AND ANAEROBIC BOTTLES Performed at [facility], [place] [address], [place], [place] [address]    Culture GRAM POSITIVE COCCI  Final   Report Status PENDING  Incomplete         Radiology Studies: CT Chest Wo Contrast  Result Date: 06/01/2022 CLINICAL DATA:  Pneumonia, complication suspected. EXAM: CT CHEST WITHOUT CONTRAST TECHNIQUE: Multidetector CT imaging of the chest was performed following the standard protocol without IV contrast. RADIATION DOSE REDUCTION: This exam was performed according to the departmental dose-optimization program which includes automated exposure control, adjustment of the mA and/or kV according to patient size and/or use of iterative reconstruction  technique. COMPARISON:  Chest radiograph performed earlier on the same date FINDINGS: Cardiovascular: The heart is normal in size. No pericardial effusion. Prominent coronary artery and aortic atherosclerotic calcifications. 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 Mediastinum/Nodes: No enlarged mediastinal or axillary lymph nodes. Thyroid gland, trachea, and esophagus demonstrate no significant findings. Lungs/Pleura: Large consolidation in the dependent portion of the right upper lobe with air bronchograms consistent with pneumonia. Emphysematous changes of the lungs. No significant pleural effusion or pneumothorax. Upper Abdomen: Multiple large cysts in the upper pole of the left kidney. Musculoskeletal: No acute osseous abnormality. Thoracic spondylosis with dextroscoliosis. IMPRESSION: 1. Large consolidation in the dependent portion of the right upper lobe with air bronchograms consistent with pneumonia. No appreciable mass. Follow-up examination to resolution is recommended. 2. Emphysematous changes of the lungs. 3. Prominent coronary artery and aortic atherosclerotic calcifications. 4. Multiple large cysts in the upper pole of the left kidney. 5. Aortic atherosclerosis. Aortic Atherosclerosis (ICD10-I70.0) and Emphysema (ICD10-J43.9). Electronically Signed   By: Keane Police D.O.   On: 06/01/2022 14:00   DG Chest 1 View  Result Date: 06/01/2022 CLINICAL DATA:  Shortness of breath. EXAM: CHEST  1 VIEW COMPARISON:  02/02/2022 FINDINGS: Low volume film. Interval development of diffuse airspace disease in the right upper lobe, most suggestive of pneumonia. Minimal atelectasis noted at the left lung base with possible tiny left effusion. Left permanent pacemaker again noted. IMPRESSION: Interval development of diffuse airspace disease in the right upper lobe, most suggestive of pneumonia. Close follow-up recommended to ensure resolution. CT chest could be used to exclude central obstructing  mass lesion as clinically warranted. Electronically Signed   By: Misty Stanley M.D.   On: 06/01/2022 12:26        Scheduled Meds:  albuterol  2.5 mg Nebulization Q6H   droxidopa  100 mg Oral TID WC   feeding supplement  237 mL Oral BID BM   finasteride  5 mg Oral Daily   fludrocortisone  0.1 mg Oral Daily   guaiFENesin  600 mg Oral BID   heparin  5,000 Units Subcutaneous Q8H   mirtazapine  45 mg Oral QHS   mometasone-formoterol  2 puff Inhalation BID   pantoprazole (PROTONIX) IV  40 mg Intravenous Q12H   sodium chloride flush  3 mL Intravenous Q12H   sodium chloride flush  3 mL Intravenous Q12H  thiamine  100 mg Oral Daily   Continuous Infusions:  sodium chloride 75 mL/hr at 06/01/22 2014   azithromycin Stopped (06/01/22 2336)   cefTRIAXone (ROCEPHIN)  IV 2 g (06/02/22 1509)     LOS: 1 day        Phillips Climes, MD [facility]   To contact the attending provider between 7A-7P or the covering provider during after hours 7P-7A, please log into the web site [email or web address] and access using universal [place] password for that web site. If you do not have the password, please call the hospital operator.  06/02/2022, 4:09 PM

## 2022-06-02 NOTE — ED Notes (Signed)
ED TO INPATIENT HANDOFF REPORT  ED Nurse Name and Phone #: [phone] Willene Hatchet Name/Age/Gender Alan [name] Fallen 81 y.o. male Room/Bed: H022C/H022C  Code Status   Code Status: Full Code  Home/SNF/Other Home Patient oriented to: self and place Is this baseline? Yes   Triage Complete: Triage complete  Chief Complaint CAP (community acquired pneumonia) [J18.9]  Triage Note Increasing shortness of breath, productive cough, and generalized weakness x 1 week. 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 EMS reports near syncope on transfer to EMS stretcher. Alert and oriented x 4.   EMS VS: RR 30s HR 80 BP 154/89   Allergies Allergies  Allergen Reactions   Penicillins Hives and Shortness Of Breath   Lisinopril Itching    Level of Care/Admitting Diagnosis ED Disposition     ED Disposition  Admit   Condition  --   Comment  Hospital Area: [facility] [[id number]]  Level of Care: Med-Surg [16]  May admit patient to [facility] or [facility] if equivalent level of care is available:: No  Covid Evaluation: Asymptomatic - no recent exposure (last 10 days) testing not required  Diagnosis: CAP (community acquired pneumonia) [314970]  Admitting Physician: Manfred Shirts  Attending Physician: Waldron Labs, DAWOOD S [[id number]]  Certification:: I certify this patient will need inpatient services for at least 2 midnights          B Medical/Surgery History Past Medical History:  Diagnosis Date   Asthma    Carcinoid tumor, small intestine, malignant ([place])    Chronic diarrhea    CKD (chronic kidney disease) stage 3, GFR 30-59 ml/min (HCC)    HTN (hypertension)    Short gut syndrome    Past Surgical History:  Procedure Laterality Date   COLON SURGERY     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   Procedure: PACEMAKER IMPLANT;  Surgeon: Constance Haw, MD;  Location: [facility];  Service: Cardiovascular;  Laterality: N/A;     A IV  Location/Drains/Wounds Patient Lines/Drains/Airways Status     Active Line/Drains/Airways     Name Placement date Placement time Site Days   Peripheral IV 06/01/22 20 G Left Antecubital 06/01/22  1154  Antecubital  1   Peripheral IV 06/02/22 22 G Left;Posterior Hand 06/02/22  0225  Hand  less than 1   Incision (Closed) 04/23/21 Chest Lateral;Left;Upper 04/23/21  1930  -- 405   Wound / Incision (Open or Dehisced) 01/30/22 Other (Comment) Shoulder Right;Posterior full thickness wound where previous abscess was drained 01/30/22  --  Shoulder  123            Intake/Output Last 24 hours  Intake/Output Summary (Last 24 hours) at 06/02/2022 1112 Last data filed at 06/01/2022 2336 Gross per 24 hour  Intake 350 ml  Output --  Net 350 ml    Labs/Imaging Results for orders placed or performed during the hospital encounter of 06/01/22 (from the past 48 hour(s))  Resp Panel by RT-PCR (Flu A&B, Covid) Anterior Nasal Swab     Status: None   Collection Time: 06/01/22 11:58 AM   Specimen: Anterior Nasal Swab  Result Value Ref Range   SARS Coronavirus 2 by RT PCR NEGATIVE NEGATIVE    Comment: (NOTE) SARS-CoV-2 target nucleic acids are NOT DETECTED.  The SARS-CoV-2 RNA is generally detectable in upper respiratory specimens during the acute phase of infection. The lowest concentration of SARS-CoV-2 viral copies this assay can detect is 138 copies/mL. A negative result does not preclude  SARS-Cov-2 infection and should not be used as the sole basis for treatment or other patient management decisions. A negative result may occur with  improper specimen collection/handling, submission of specimen other than nasopharyngeal swab, presence of viral mutation(s) within the areas targeted by this assay, and inadequate number of viral copies(<138 copies/mL). A negative result must be combined with clinical observations, patient history, and epidemiological information. The expected result is  Negative.  Fact Sheet for Patients:  EntrepreneurPulse.com.au  Fact Sheet for Healthcare Providers:  IncredibleEmployment.be  This test is no t yet approved or cleared by the [place] FDA and  has been authorized for detection and/or diagnosis of SARS-CoV-2 by FDA under an Emergency Use Authorization (EUA). This EUA will remain  in effect (meaning this test can be used) for the duration of the COVID-19 declaration under Section 564(b)(1) of the Act, 21 U.S.C.section 360bbb-3(b)(1), unless the authorization is terminated  or revoked sooner.       Influenza A by PCR NEGATIVE NEGATIVE   Influenza B by PCR NEGATIVE NEGATIVE    Comment: (NOTE) The Xpert Xpress SARS-CoV-2/FLU/RSV plus assay is intended as an aid in the diagnosis of influenza from Nasopharyngeal swab specimens and should not be used as a sole basis for treatment. Nasal washings and aspirates are unacceptable for Xpert Xpress SARS-CoV-2/FLU/RSV testing.  Fact Sheet for Patients: EntrepreneurPulse.com.au  Fact Sheet for Healthcare Providers: IncredibleEmployment.be  This test is not yet approved or cleared by the [place] FDA and has been authorized for detection and/or diagnosis of SARS-CoV-2 by FDA under an Emergency Use Authorization (EUA). This EUA will remain in effect (meaning this test can be used) for the duration of the COVID-19 declaration under Section 564(b)(1) of the Act, 21 U.S.C. section 360bbb-3(b)(1), unless the authorization is terminated or revoked.  Performed at [facility], [place] [address], [place], [place] [address]   Comprehensive metabolic panel     Status: Abnormal   Collection Time: 06/01/22 11:58 AM  Result Value Ref Range   Sodium 137 135 - 145 mmol/L   Potassium 3.6 3.5 - 5.1 mmol/L   Chloride 115 (H) 98 - 111 mmol/L   CO2 14 (L) 22 - 32 mmol/L   Glucose, Bld 144 (H) 70 - 99 mg/dL    Comment:  Glucose reference range applies only to samples taken after fasting for at least 8 hours.   BUN 74 (H) 8 - 23 mg/dL   Creatinine, Ser 4.42 (H) 0.61 - 1.24 mg/dL   Calcium 7.9 (L) 8.9 - 10.3 mg/dL   Total Protein 5.7 (L) 6.5 - 8.1 g/dL   Albumin 2.4 (L) 3.5 - 5.0 g/dL   AST 14 (L) 15 - 41 U/L   ALT 13 0 - 44 U/L   Alkaline Phosphatase 43 38 - 126 U/L   Total Bilirubin 0.7 0.3 - 1.2 mg/dL   GFR, Estimated 13 (L) >60 mL/min    Comment: (NOTE) Calculated using the CKD-EPI Creatinine Equation (2021)    Anion gap 8 5 - 15    Comment: Performed at [facility], [place] [address], [place] [address]  CBC     Status: Abnormal   Collection Time: 06/01/22 11:58 AM  Result Value Ref Range   WBC 20.6 (H) 4.0 - 10.5 K/uL   RBC 2.57 (L) 4.22 - 5.81 MIL/uL   Hemoglobin 8.2 (L) 13.0 - 17.0 g/dL   HCT 22.2 (L) 39.0 - 52.0 %   MCV 86.4 80.0 - 100.0  fL   MCH 31.9 26.0 - 34.0 pg   MCHC 36.9 (H) 30.0 - 36.0 g/dL   RDW 13.7 11.5 - 15.5 %   Platelets 236 150 - 400 K/uL   nRBC 0.0 0.0 - 0.2 %    Comment: Performed at [facility], [place] [address] [place], [place] [address]  Culture, blood (routine x 2)     Status: None (Preliminary result)   Collection Time: 06/01/22  3:48 PM   Specimen: BLOOD LEFT FOREARM  Result Value Ref Range   Specimen Description BLOOD LEFT FOREARM    Special Requests      BOTTLES DRAWN AEROBIC AND ANAEROBIC Blood Culture adequate volume   Culture      NO GROWTH < 24 HOURS Performed at [facility], [place] [address] [place], [place] [address]    Report Status PENDING   Culture, blood (routine x 2)     Status: None (Preliminary result)   Collection Time: 06/01/22  4:00 PM   Specimen: BLOOD  Result Value Ref Range   Specimen Description BLOOD LEFT ANTECUBITAL    Special Requests      BOTTLES DRAWN AEROBIC AND ANAEROBIC Blood Culture results may not be optimal due to an inadequate volume of blood received in culture bottles   Culture      NO GROWTH <  24 HOURS Performed at [facility] [address] [place],  [address]    Report Status PENDING   Procalcitonin - Baseline     Status: None   Collection Time: 06/01/22  8:20 PM  Result Value Ref Range   Procalcitonin 6.15 ng/mL    Comment:        Interpretation: PCT > 2 ng/mL: Systemic infection (sepsis) is likely, unless other causes are known. (NOTE)       Sepsis PCT Algorithm           Lower Respiratory Tract                                      Infection PCT Algorithm    ----------------------------     ----------------------------         PCT < 0.25 ng/mL                PCT < 0.10 ng/mL          Strongly encourage             Strongly discourage   discontinuation of antibiotics    initiation of antibiotics    ----------------------------     -----------------------------       PCT 0.25 - 0.50 ng/mL            PCT 0.10 - 0.25 ng/mL               OR       >80% decrease in PCT            Discourage initiation of                                            antibiotics      Encourage discontinuation           of antibiotics    ----------------------------     -----------------------------  PCT >= 0.50 ng/mL              PCT 0.26 - 0.50 ng/mL               AND       <80% decrease in PCT              Encourage initiation of                                             antibiotics       Encourage continuation           of antibiotics    ----------------------------     -----------------------------        PCT >= 0.50 ng/mL                  PCT > 0.50 ng/mL               AND         increase in PCT                  Strongly encourage                                      initiation of antibiotics    Strongly encourage escalation           of antibiotics                                     -----------------------------                                           PCT <= 0.25 ng/mL                                                 OR                                         > 80% decrease in PCT                                      Discontinue / Do not initiate                                             antibiotics  Performed at [facility], [address] [place], [place] [address]   Type and screen [place]     Status: None   Collection Time: 06/01/22  8:20 PM  Result Value Ref Range   ABO/RH(D) A POS    Antibody Screen NEG    Sample Expiration      06/04/2022,2359  Performed at [facility], [place] [address] [place], [place] [address]   Hemoglobin and hematocrit, blood     Status: Abnormal   Collection Time: 06/01/22  8:20 PM  Result Value Ref Range   Hemoglobin 9.1 (L) 13.0 - 17.0 g/dL   HCT 25.1 (L) 39.0 - 52.0 %    Comment: Performed at [facility] [address] [place], [place] [address]  CBC     Status: Abnormal   Collection Time: 06/02/22  3:50 AM  Result Value Ref Range   WBC 24.6 (H) 4.0 - 10.5 K/uL   RBC 2.66 (L) 4.22 - 5.81 MIL/uL   Hemoglobin 8.4 (L) 13.0 - 17.0 g/dL   HCT 22.4 (L) 39.0 - 52.0 %   MCV 84.2 80.0 - 100.0 fL   MCH 31.6 26.0 - 34.0 pg   MCHC 37.5 (H) 30.0 - 36.0 g/dL   RDW 13.4 11.5 - 15.5 %   Platelets 222 150 - 400 K/uL   nRBC 0.0 0.0 - 0.2 %    Comment: Performed at [facility], [place] [address] [place], [place] [address]  Basic metabolic panel     Status: Abnormal   Collection Time: 06/02/22  3:50 AM  Result Value Ref Range   Sodium 137 135 - 145 mmol/L   Potassium 3.7 3.5 - 5.1 mmol/L   Chloride 112 (H) 98 - 111 mmol/L   CO2 14 (L) 22 - 32 mmol/L   Glucose, Bld 136 (H) 70 - 99 mg/dL    Comment: Glucose reference range applies only to samples taken after fasting for at least 8 hours.   BUN 80 (H) 8 - 23 mg/dL   Creatinine, Ser 4.34 (H) 0.61 - 1.24 mg/dL   Calcium 8.1 (L) 8.9 - 10.3 mg/dL   GFR, Estimated 13 (L) >60 mL/min    Comment: (NOTE) Calculated using the CKD-EPI Creatinine Equation (2021)    Anion gap 11 5 - 15    Comment: Performed at  [facility] [address], [place], [place] [address]  Procalcitonin     Status: None   Collection Time: 06/02/22  3:50 AM  Result Value Ref Range   Procalcitonin 5.85 ng/mL    Comment:        Interpretation: PCT > 2 ng/mL: Systemic infection (sepsis) is likely, unless other causes are known. (NOTE)       Sepsis PCT Algorithm           Lower Respiratory Tract                                      Infection PCT Algorithm    ----------------------------     ----------------------------         PCT < 0.25 ng/mL                PCT < 0.10 ng/mL          Strongly encourage             Strongly discourage   discontinuation of antibiotics    initiation of antibiotics    ----------------------------     -----------------------------       PCT 0.25 - 0.50 ng/mL            PCT 0.10 - 0.25 ng/mL               OR       >  80% decrease in PCT            Discourage initiation of                                            antibiotics      Encourage discontinuation           of antibiotics    ----------------------------     -----------------------------         PCT >= 0.50 ng/mL              PCT 0.26 - 0.50 ng/mL               AND       <80% decrease in PCT              Encourage initiation of                                             antibiotics       Encourage continuation           of antibiotics    ----------------------------     -----------------------------        PCT >= 0.50 ng/mL                  PCT > 0.50 ng/mL               AND         increase in PCT                  Strongly encourage                                      initiation of antibiotics    Strongly encourage escalation           of antibiotics                                     -----------------------------                                           PCT <= 0.25 ng/mL                                                 OR                                        > 80% decrease in PCT                                       Discontinue / Do not initiate  antibiotics  Performed at [facility], [place] [address], [place], [place] [address]   Prealbumin     Status: Abnormal   Collection Time: 06/02/22  3:50 AM  Result Value Ref Range   Prealbumin 7 (L) 18 - 38 mg/dL    Comment: Performed at [facility] [address], [place], [place] [address]   CT Chest Wo Contrast  Result Date: 06/01/2022 CLINICAL DATA:  Pneumonia, complication suspected. EXAM: CT CHEST WITHOUT CONTRAST TECHNIQUE: Multidetector CT imaging of the chest was performed following the standard protocol without IV contrast. RADIATION DOSE REDUCTION: This exam was performed according to the departmental dose-optimization program which includes automated exposure control, adjustment of the mA and/or kV according to patient size and/or use of iterative reconstruction technique. COMPARISON:  Chest radiograph performed earlier on the same date FINDINGS: Cardiovascular: The heart is normal in size. No pericardial effusion. Prominent coronary artery and aortic atherosclerotic calcifications. 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 Mediastinum/Nodes: No enlarged mediastinal or axillary lymph nodes. Thyroid gland, trachea, and esophagus demonstrate no significant findings. Lungs/Pleura: Large consolidation in the dependent portion of the right upper lobe with air bronchograms consistent with pneumonia. Emphysematous changes of the lungs. No significant pleural effusion or pneumothorax. Upper Abdomen: Multiple large cysts in the upper pole of the left kidney. Musculoskeletal: No acute osseous abnormality. Thoracic spondylosis with dextroscoliosis. IMPRESSION: 1. Large consolidation in the dependent portion of the right upper lobe with air bronchograms consistent with pneumonia. No appreciable mass. Follow-up examination to resolution is recommended. 2. Emphysematous changes of the lungs. 3.  Prominent coronary artery and aortic atherosclerotic calcifications. 4. Multiple large cysts in the upper pole of the left kidney. 5. Aortic atherosclerosis. Aortic Atherosclerosis (ICD10-I70.0) and Emphysema (ICD10-J43.9). Electronically Signed   By: Keane Police D.O.   On: 06/01/2022 14:00   DG Chest 1 View  Result Date: 06/01/2022 CLINICAL DATA:  Shortness of breath. EXAM: CHEST  1 VIEW COMPARISON:  02/02/2022 FINDINGS: Low volume film. Interval development of diffuse airspace disease in the right upper lobe, most suggestive of pneumonia. Minimal atelectasis noted at the left lung base with possible tiny left effusion. Left permanent pacemaker again noted. IMPRESSION: Interval development of diffuse airspace disease in the right upper lobe, most suggestive of pneumonia. Close follow-up recommended to ensure resolution. CT chest could be used to exclude central obstructing mass lesion as clinically warranted. Electronically Signed   By: Misty Stanley M.D.   On: 06/01/2022 12:26    Pending Labs Unresulted Labs (From admission, onward)     Start     Ordered   06/02/22 0500  Procalcitonin  Daily at 5am,   R      06/01/22 1601   06/01/22 1849  Occult blood card to lab, stool RN will collect  Once,   R       Question:  Specimen to be collected by:  Answer:  RN will collect   06/01/22 1848   06/01/22 1600  Legionella Pneumophila Serogp 1 Ur Ag  (COPD / Pneumonia / Cellulitis / Lower Extremity Wound)  Once,   R        06/01/22 1601   06/01/22 1600  Expectorated Sputum Assessment w Gram Stain, Rflx to Resp Cult  (COPD / Pneumonia / Cellulitis / Lower Extremity Wound)  Once,   R        06/01/22 1601   06/01/22 1600  Culture, blood (routine x 2) Call MD if unable to obtain  prior to antibiotics being given  (COPD / Pneumonia / Cellulitis / Lower Extremity Wound)  BLOOD CULTURE X 2,   R (with TIMED occurrences)     Comments: If blood cultures drawn in Emergency Department - Do not draw and cancel order     06/01/22 1601            Vitals/Pain Today's Vitals   06/02/22 0559 06/02/22 0600 06/02/22 0600 06/02/22 1028  BP: 121/74   137/81  Pulse: 76   71  Resp: 20   20  Temp:      TempSrc:      SpO2: 98%   99%  Weight:      Height:      PainSc:  0-No pain 0-No pain Asleep    Isolation Precautions No active isolations  Medications Medications  heparin injection 5,000 Units (5,000 Units Subcutaneous Given 06/02/22 0558)  sodium chloride flush (NS) 0.9 % injection 3 mL (3 mLs Intravenous Not Given 06/02/22 1022)  sodium chloride flush (NS) 0.9 % injection 3 mL (3 mLs Intravenous Given 06/02/22 1024)  acetaminophen (TYLENOL) tablet 650 mg (has no administration in time range)    Or  acetaminophen (TYLENOL) suppository 650 mg (has no administration in time range)  ondansetron (ZOFRAN) tablet 4 mg (has no administration in time range)    Or  ondansetron (ZOFRAN) injection 4 mg (has no administration in time range)  albuterol (PROVENTIL) (2.5 MG/3ML) 0.083% nebulizer solution 2.5 mg (2.5 mg Nebulization Not Given 06/02/22 0208)  guaiFENesin (MUCINEX) 12 hr tablet 600 mg (600 mg Oral Given 06/02/22 1025)  cefTRIAXone (ROCEPHIN) 2 g in sodium chloride 0.9 % 100 mL IVPB (0 g Intravenous Stopped 06/01/22 2126)  azithromycin (ZITHROMAX) 500 mg in sodium chloride 0.9 % 250 mL IVPB (0 mg Intravenous Stopped 06/01/22 2336)  droxidopa (NORTHERA) capsule 100 mg (100 mg Oral Given 06/02/22 1021)  fludrocortisone (FLORINEF) tablet 0.1 mg (0.1 mg Oral Given 06/02/22 1021)  dronabinol (MARINOL) capsule 5 mg (5 mg Oral Not Given 06/02/22 1027)  feeding supplement (ENSURE ENLIVE / ENSURE PLUS) liquid 237 mL (237 mLs Oral Not Given 06/02/22 1025)  thiamine (VITAMIN B1) tablet 100 mg (100 mg Oral Given 06/02/22 1025)  mometasone-formoterol (DULERA) 200-5 MCG/ACT inhaler 2 puff (2 puffs Inhalation Given 06/02/22 1022)  finasteride (PROSCAR) tablet 5 mg (5 mg Oral Given 06/02/22 1025)  diphenoxylate-atropine  (LOMOTIL) 2.5-0.025 MG per tablet 1 tablet (1 tablet Oral Not Given 06/01/22 1957)  loperamide (IMODIUM) capsule 2 mg (has no administration in time range)  hydrALAZINE (APRESOLINE) injection 10 mg (has no administration in time range)  pantoprazole (PROTONIX) injection 40 mg (40 mg Intravenous Given 06/02/22 1025)  0.9 %  sodium chloride infusion ( Intravenous New Bag/Given 06/01/22 2014)  mirtazapine (REMERON) tablet 45 mg (45 mg Oral Given 06/01/22 2148)  sodium chloride 0.9 % bolus 1,800 mL (0 mLs Intravenous Stopped 06/01/22 2127)    Mobility Have not ambulated pt, pt is markedly weak and will likely require dual assist to get up. Daughter at bedside assisting with care High fall risk   Focused Assessments Neuro Assessment Handoff:  Swallow screen pass?  N/A Cardiac Rhythm: Normal sinus rhythm       Neuro Assessment: Within Defined Limits Neuro Checks:      Last Documented NIHSS Modified Score:   Has TPA been given? No If patient is a Neuro Trauma and patient is going to OR before floor call report to [facility] nurse: [phone] or [phone]  R Recommendations: See Admitting Provider Note  Report given to:   Additional Notes: Pt is in brief, incontinent of urine. Will likely benefit from male purewick, but in hallway so no suction available. Takes pills whole with water, slight cough, but able to clear secretions. GCS 15. No complaints for this RN.

## 2022-06-03 ENCOUNTER — Inpatient Hospital Stay (HOSPITAL_COMMUNITY): Payer: Medicare Other

## 2022-06-03 ENCOUNTER — Other Ambulatory Visit (HOSPITAL_COMMUNITY): Payer: Medicare Other

## 2022-06-03 DIAGNOSIS — R7881 Bacteremia: Secondary | ICD-10-CM

## 2022-06-03 DIAGNOSIS — B955 Unspecified streptococcus as the cause of diseases classified elsewhere: Secondary | ICD-10-CM

## 2022-06-03 LAB — FERRITIN: Ferritin: 339 ng/mL — ABNORMAL HIGH (ref 24–336)

## 2022-06-03 LAB — BASIC METABOLIC PANEL
Anion gap: 16 — ABNORMAL HIGH (ref 5–15)
BUN: 81 mg/dL — ABNORMAL HIGH (ref 8–23)
CO2: 16 mmol/L — ABNORMAL LOW (ref 22–32)
Calcium: 8.6 mg/dL — ABNORMAL LOW (ref 8.9–10.3)
Chloride: 109 mmol/L (ref 98–111)
Creatinine, Ser: 4.74 mg/dL — ABNORMAL HIGH (ref 0.61–1.24)
GFR, Estimated: 12 mL/min — ABNORMAL LOW (ref >60)
Glucose, Bld: 141 mg/dL — ABNORMAL HIGH (ref 70–99)
Potassium: 3.4 mmol/L — ABNORMAL LOW (ref 3.5–5.1)
Sodium: 141 mmol/L (ref 135–145)

## 2022-06-03 LAB — CBC
HCT: 19.5 % — ABNORMAL LOW (ref 39.0–52.0)
Hemoglobin: 7 g/dL — ABNORMAL LOW (ref 13.0–17.0)
MCH: 30.4 pg (ref 26.0–34.0)
MCHC: 35.9 g/dL (ref 30.0–36.0)
MCV: 84.8 fL (ref 80.0–100.0)
Platelets: 248 10*3/uL (ref 150–400)
RBC: 2.3 MIL/uL — ABNORMAL LOW (ref 4.22–5.81)
RDW: 13.5 % (ref 11.5–15.5)
WBC: 18 10*3/uL — ABNORMAL HIGH (ref 4.0–10.5)
nRBC: 0.1 % (ref 0.0–0.2)

## 2022-06-03 LAB — ECHOCARDIOGRAM COMPLETE
Area-P 1/2: 2.64 cm2
Calc EF: 52.1 %
Height: 73 in
MV M vel: 0.73 m/s
MV Peak grad: 2.1 mmHg
Single Plane A2C EF: 34.9 %
Single Plane A4C EF: 64.5 %
Weight: 2116.42 oz

## 2022-06-03 LAB — PREPARE RBC (CROSSMATCH)

## 2022-06-03 LAB — VITAMIN B12: Vitamin B-12: 554 pg/mL (ref 180–914)

## 2022-06-03 LAB — IRON AND TIBC
Iron: 11 ug/dL — ABNORMAL LOW (ref 45–182)
Saturation Ratios: 9 % — ABNORMAL LOW (ref 17.9–39.5)
TIBC: 119 ug/dL — ABNORMAL LOW (ref 250–450)
UIBC: 108 ug/dL

## 2022-06-03 LAB — RETICULOCYTES
Immature Retic Fract: 21.3 % — ABNORMAL HIGH (ref 2.3–15.9)
RBC.: 2.24 MIL/uL — ABNORMAL LOW (ref 4.22–5.81)
Retic Count, Absolute: 29.3 10*3/uL (ref 19.0–186.0)
Retic Ct Pct: 1.3 % (ref 0.4–3.1)

## 2022-06-03 LAB — PROCALCITONIN: Procalcitonin: 5.25 ng/mL

## 2022-06-03 LAB — FOLATE: Folate: 19.9 ng/mL (ref >5.9)

## 2022-06-03 MED ORDER — PERFLUTREN LIPID MICROSPHERE
1.0000 mL | INTRAVENOUS | Status: AC | PRN
  Administered 2022-06-03: 4 mL via INTRAVENOUS

## 2022-06-03 MED ORDER — ALBUTEROL SULFATE (2.5 MG/3ML) 0.083% IN NEBU: 2.5000 mg | INHALATION_SOLUTION | RESPIRATORY_TRACT | Status: DC | PRN

## 2022-06-03 MED ORDER — POLYETHYLENE GLYCOL 3350 17 G PO PACK: 17.0000 g | PACK | Freq: Every day | ORAL | Status: DC | PRN

## 2022-06-03 MED ORDER — RENA-VITE PO TABS
1.0000 | ORAL_TABLET | Freq: Every day | ORAL | Status: DC
  Administered 2022-06-03 – 2022-06-04 (×2): 1 via ORAL
  Filled 2022-06-03 (×2): qty 1

## 2022-06-03 MED ORDER — SODIUM CHLORIDE 0.9% IV SOLUTION: Freq: Once | INTRAVENOUS | Status: AC

## 2022-06-03 MED ORDER — ENSURE ENLIVE PO LIQD
237.0000 mL | Freq: Three times a day (TID) | ORAL | Status: DC
  Administered 2022-06-04 – 2022-06-05 (×4): 237 mL via ORAL

## 2022-06-03 MED ORDER — POTASSIUM CHLORIDE CRYS ER 20 MEQ PO TBCR
40.0000 meq | EXTENDED_RELEASE_TABLET | Freq: Once | ORAL | Status: AC
  Administered 2022-06-03: 40 meq via ORAL
  Filled 2022-06-03: qty 2

## 2022-06-03 NOTE — Progress Notes (Signed)
  Echocardiogram 2D Echocardiogram has been performed.  Alan [name] 06/03/2022, 2:44 PM

## 2022-06-03 NOTE — Progress Notes (Signed)
PROGRESS NOTE    Alan [name]  RCV:[id number] DOB: 1940-07-11 DOA: 06/01/2022 PCP: [name], MD    Brief Narrative:   Alan [name] is a 81 y.o. male with past medical history significant for essential hypertension, atrial fibrillation not on anticoagulation due to history of GIB, malignant carcinoid of the intestines, CKD stage IV, chronic diarrhea who presented to [facility] ED with 3-4-day history of progressively worsening weakness, cough.  Evaluation in the ED notable for leukocytosis, CT chest with right upper lobe pneumonia.  Hospitalist service was consulted for admission for further evaluation management of sepsis secondary to commune acquired pneumonia.  Assessment & Plan:   Sepsis, POA Streptococcus pneumonia bacteremia Community-acquired pneumonia Patient presenting to ED with 4-day history of progressive weakness, cough was found to be tachypneic, with leukocytosis.  CT chest with right upper lobe consolidation.  Blood cultures x 4 positive for Streptococcus pneumonia per BCID.  Complicated by history of PPM in place --WBC 20.6>24.6>18.0 --Blood cultures x 2 12/4: 4/4 + Streptococcus pneumonia, pending susceptibilities --Continue ceftriaxone 2 g IV every 24 hours --TTE: pending --repeat blood cultures x 2 today --CBC daily  Anemia of chronic medical/renal disease, acute on chronic Hx GI Bleed Patient was seen by gastroenterology, Dr. Michail Sermon on 12/5.  Recent EGD 2022 showing gastritis and colonoscopy 06/2020 with 2 sessile serrated adenomas that were removed.  Patient denies any overt bleeding.  GI recommends holding off on endoscopic procedures with planned repeat surveillance colonoscopy in January 2024.  Anemia panel with iron 11, TIBC 119, ferritin 399, folate 19.9, vitamin B12 554. --Hgb 8.2>9.1>8.4>7.0 --Transfuse 2 unit PRBCs today --Protonix 40 mg IV every 12 hours --Hold on IV iron given active infection --CBC daily  Acute renal failure on CKD stage  IV Baseline creatinine 2.7-3.0.  Patient likely dehydrated secondary to poor oral intake as well as diarrhea versus ATN from septicemia.  Received IV fluids during hospitalization. --Cr 4.42>4.34>4.74 --Closely monitor urinary output --BMP daily  Hx paroxysmal atrial fibrillation not on anticoagulation Follows with cardiology outpatient.  Not on anticoagulation due to fall risk, history of GI bleeds.  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 Irene Limbo and palliative care outpatient.  Currently on Lanreotide every 4 weeks  Hx AV block, second-degree s/p PPM Follows with electrophysiology, Dr. Lovena Le outpatient.  Has pacemaker in place.  Hx neurogenic orthostatic hypotension -- Continue droxidopa 100 mg TIDAC -- Fludrocortisone 0.1 mg p.o. daily  Asthma On Symbicort and albuterol as needed outpatient. Ruthe Mannan as hospital substitution inpatient -- Albuterol neb every 6 hours  Severe protein calorie malnutrition Body mass index is 17.45 kg/m.  -- Dietitian consult  Weakness/debility/deconditioning: -- PT, recommends home health, continue therapy efforts while inpatient   DVT prophylaxis: heparin injection 5,000 Units Start: 06/01/22 2200 Place and maintain sequential compression device Start: 06/01/22 1606    Code Status: Full Code Family Communication:   Disposition Plan:  Level of care: Med-Surg Status is: Inpatient Remains inpatient appropriate because: Awaiting blood culture susceptibilities    Consultants:  None  Procedures:  None  Antimicrobials:  Azithromycin 12/4 - 12/5 Ceftriaxone 12/4>>   Subjective: Patient seen examined bedside, resting comfortably.  Lying in bed.  Continues to feel weak; although asked when he will be able to go home.  Discussed with patient still waiting completion of workup to include susceptibilities from  initial blood cultures and will  need blood transfusion, TTE and repeat blood cultures today.  WBC count now starting to trend down.  No other questions or concerns at this time.  Denies headache, no fever/chills/night sweats, no nausea/vomiting, no abdominal pain, no focal weakness, no chest pain, no palpitations, no paresthesias.  No acute events overnight per nursing staff.  Objective: Vitals:   06/03/22 0224 06/03/22 0544 06/03/22 0814 06/03/22 0826  BP:  (!) 143/82  (!) 140/74  Pulse: 62 64  63  Resp: '18 16  19  '$ Temp:  (!) 97.5 F (36.4 C)    TempSrc:  Oral    SpO2: 98% 98% 96% 98%  Weight:      Height:        Intake/Output Summary (Last 24 hours) at 06/03/2022 1041 Last data filed at 06/03/2022 0911 Gross per 24 hour  Intake 3339.03 ml  Output 900 ml  Net 2439.03 ml   Filed Weights   06/01/22 1154  Weight: 60 kg    Examination:  Physical Exam: GEN: NAD, alert and oriented x 3, chronically ill in appearance HEENT: NCAT, PERRL, EOMI, sclera clear, MMM PULM: Breath sounds slightly decreased bilateral bases, no wheezing/rhonchi, normal respiratory effort without accessory muscle use, on room air at rest CV: RRR w/o M/G/R GI: abd soft, NTND, NABS, no R/G/M MSK: no peripheral edema, moves all extremities independently NEURO: CN II-XII intact, no focal deficits, sensation to light touch intact PSYCH: normal mood/affect Integumentary: dry/intact, no rashes or wounds    Data Reviewed: I have personally reviewed following labs and imaging studies  CBC: Recent Labs  Lab 06/01/22 1158 06/01/22 2020 06/02/22 0350 06/03/22 0248  WBC 20.6*  --  24.6* 18.0*  HGB 8.2* 9.1* 8.4* 7.0*  HCT 22.2* 25.1* 22.4* 19.5*  MCV 86.4  --  84.2 84.8  PLT 236  --  222 993   Basic Metabolic Panel: Recent Labs  Lab 06/01/22 1158 06/02/22 0350 06/03/22 0248  NA 137 137 141  K 3.6 3.7 3.4*  CL 115* 112* 109  CO2 14* 14* 16*  GLUCOSE 144* 136* 141*  BUN 74* 80* 81*  CREATININE  4.42* 4.34* 4.74*  CALCIUM 7.9* 8.1* 8.6*   GFR: Estimated Creatinine Clearance: 10.5 mL/min (A) (by C-G formula based on SCr of 4.74 mg/dL (H)). Liver Function Tests: Recent Labs  Lab 06/01/22 1158  AST 14*  ALT 13  ALKPHOS 43  BILITOT 0.7  PROT 5.7*  ALBUMIN 2.4*   No results for input(s): "LIPASE", "AMYLASE" in the last 168 hours. No results for input(s): "AMMONIA" in the last 168 hours. Coagulation Profile: No results for input(s): "INR", "PROTIME" in the last 168 hours. Cardiac Enzymes: No results for input(s): "CKTOTAL", "CKMB", "CKMBINDEX", "TROPONINI" in the last 168 hours. BNP (last 3 results) No results for input(s): "PROBNP" in the last 8760 hours. HbA1C: No results for input(s): "HGBA1C" in the last 72 hours. CBG: No results for input(s): "GLUCAP" in the last 168 hours. Lipid Profile: No results for input(s): "CHOL", "HDL", "LDLCALC", "TRIG", "CHOLHDL", "LDLDIRECT" in the last 72 hours. Thyroid Function Tests: No results for input(s): "TSH", "T4TOTAL", "FREET4", "T3FREE", "THYROIDAB" in the last 72 hours. Anemia Panel: Recent Labs    06/03/22 0248  VITAMINB12 554  FOLATE 19.9  FERRITIN 339*  TIBC 119*  IRON 11*  RETICCTPCT 1.3   Sepsis Labs: Recent Labs  Lab 06/01/22 2020 06/02/22 0350 06/03/22 0248  PROCALCITON 6.15 5.85 5.25    Recent Results (from the past 240 hour(s))  Resp Panel by  RT-PCR (Flu A&B, Covid) Anterior Nasal Swab     Status: None   Collection Time: 06/01/22 11:58 AM   Specimen: Anterior Nasal Swab  Result Value Ref Range Status   SARS Coronavirus 2 by RT PCR NEGATIVE NEGATIVE Final    Comment: (NOTE) SARS-CoV-2 target nucleic acids are NOT DETECTED.  The SARS-CoV-2 RNA is generally detectable in upper respiratory specimens during the acute phase of infection. The lowest concentration of SARS-CoV-2 viral copies this assay can detect is 138 copies/mL. A negative result does not preclude SARS-Cov-2 infection and should not be  used as the sole basis for treatment or other patient management decisions. A negative result may occur with  improper specimen collection/handling, submission of specimen other than nasopharyngeal swab, presence of viral mutation(s) within the areas targeted by this assay, and inadequate number of viral copies(<138 copies/mL). A negative result must be combined with clinical observations, patient history, and epidemiological information. The expected result is Negative.  Fact Sheet for Patients:  EntrepreneurPulse.com.au  Fact Sheet for Healthcare Providers:  IncredibleEmployment.be  This test is no t yet approved or cleared by the [place] FDA and  has been authorized for detection and/or diagnosis of SARS-CoV-2 by FDA under an Emergency Use Authorization (EUA). This EUA will remain  in effect (meaning this test can be used) for the duration of the COVID-19 declaration under Section 564(b)(1) of the Act, 21 U.S.C.section 360bbb-3(b)(1), unless the authorization is terminated  or revoked sooner.       Influenza A by PCR NEGATIVE NEGATIVE Final   Influenza B by PCR NEGATIVE NEGATIVE Final    Comment: (NOTE) The Xpert Xpress SARS-CoV-2/FLU/RSV plus assay is intended as an aid in the diagnosis of influenza from Nasopharyngeal swab specimens and should not be used as a sole basis for treatment. Nasal washings and aspirates are unacceptable for Xpert Xpress SARS-CoV-2/FLU/RSV testing.  Fact Sheet for Patients: EntrepreneurPulse.com.au  Fact Sheet for Healthcare Providers: IncredibleEmployment.be  This test is not yet approved or cleared by the [place] FDA and has been authorized for detection and/or diagnosis of SARS-CoV-2 by FDA under an Emergency Use Authorization (EUA). This EUA will remain in effect (meaning this test can be used) for the duration of the COVID-19 declaration under Section  564(b)(1) of the Act, 21 U.S.C. section 360bbb-3(b)(1), unless the authorization is terminated or revoked.  Performed at [facility], [place] [address], [place], [place] [address]   Culture, blood (routine x 2)     Status: Abnormal (Preliminary result)   Collection Time: 06/01/22  3:48 PM   Specimen: BLOOD LEFT FOREARM  Result Value Ref Range Status   Specimen Description BLOOD LEFT FOREARM  Final   Special Requests   Final    BOTTLES DRAWN AEROBIC AND ANAEROBIC Blood Culture adequate volume   Culture  Setup Time   Final    GRAM POSITIVE COCCI IN CHAINS IN BOTH AEROBIC AND ANAEROBIC BOTTLES CRITICAL RESULT CALLED TO, READ BACK BY AND VERIFIED WITH: PHARMD ANDREW MEYER ON 06/02/22 @ 1538 BY DRT    Culture (A)  Final    STREPTOCOCCUS PNEUMONIAE SUSCEPTIBILITIES TO FOLLOW Performed at [facility], [place] [address], [place], [place] [address]    Report Status PENDING  Incomplete  Blood Culture ID Panel (Reflexed)     Status: Abnormal   Collection Time: 06/01/22  3:48 PM  Result Value Ref Range Status   Enterococcus faecalis NOT DETECTED NOT DETECTED Final   Enterococcus Faecium NOT DETECTED NOT  DETECTED Final   Listeria monocytogenes NOT DETECTED NOT DETECTED Final   Staphylococcus species NOT DETECTED NOT DETECTED Final   Staphylococcus aureus (BCID) NOT DETECTED NOT DETECTED Final   Staphylococcus epidermidis NOT DETECTED NOT DETECTED Final   Staphylococcus lugdunensis NOT DETECTED NOT DETECTED Final   Streptococcus species DETECTED (A) NOT DETECTED Final    Comment: CRITICAL RESULT CALLED TO, READ BACK BY AND VERIFIED WITH: PHARMD ANDREW MEYER ON 06/02/22 @ 1538 BY DRT    Streptococcus agalactiae NOT DETECTED NOT DETECTED Final   Streptococcus pneumoniae DETECTED (A) NOT DETECTED Final    Comment: CRITICAL RESULT CALLED TO, READ BACK BY AND VERIFIED WITH: PHARMD ANDREW MEYER ON 06/02/22 @ 1538 BY DRT    Streptococcus pyogenes NOT DETECTED NOT DETECTED Final    A.calcoaceticus-baumannii NOT DETECTED NOT DETECTED Final   Bacteroides fragilis NOT DETECTED NOT DETECTED Final   Enterobacterales NOT DETECTED NOT DETECTED Final   Enterobacter cloacae complex NOT DETECTED NOT DETECTED Final   Escherichia coli NOT DETECTED NOT DETECTED Final   Klebsiella aerogenes NOT DETECTED NOT DETECTED Final   Klebsiella oxytoca NOT DETECTED NOT DETECTED Final   Klebsiella pneumoniae NOT DETECTED NOT DETECTED Final   Proteus species NOT DETECTED NOT DETECTED Final   Salmonella species NOT DETECTED NOT DETECTED Final   Serratia marcescens NOT DETECTED NOT DETECTED Final   Haemophilus influenzae NOT DETECTED NOT DETECTED Final   Neisseria meningitidis NOT DETECTED NOT DETECTED Final   Pseudomonas aeruginosa NOT DETECTED NOT DETECTED Final   Stenotrophomonas maltophilia NOT DETECTED NOT DETECTED Final   Candida albicans NOT DETECTED NOT DETECTED Final   Candida auris NOT DETECTED NOT DETECTED Final   Candida glabrata NOT DETECTED NOT DETECTED Final   Candida krusei NOT DETECTED NOT DETECTED Final   Candida parapsilosis NOT DETECTED NOT DETECTED Final   Candida tropicalis NOT DETECTED NOT DETECTED Final   Cryptococcus neoformans/gattii NOT DETECTED NOT DETECTED Final    Comment: Performed at [facility], [place] [address] [place], [place] [address]  Culture, blood (routine x 2)     Status: None (Preliminary result)   Collection Time: 06/01/22  4:00 PM   Specimen: BLOOD  Result Value Ref Range Status   Specimen Description BLOOD LEFT ANTECUBITAL  Final   Special Requests   Final    BOTTLES DRAWN AEROBIC AND ANAEROBIC Blood Culture results may not be optimal due to an inadequate volume of blood received in culture bottles   Culture  Setup Time   Final    GRAM POSITIVE COCCI IN CHAINS IN BOTH AEROBIC AND ANAEROBIC BOTTLES Performed at [facility], [place] [address], [place], [place] [address]    Culture GRAM POSITIVE COCCI  Final   Report Status PENDING   Incomplete         Radiology Studies: CT Chest Wo Contrast  Result Date: 06/01/2022 CLINICAL DATA:  Pneumonia, complication suspected. EXAM: CT CHEST WITHOUT CONTRAST TECHNIQUE: Multidetector CT imaging of the chest was performed following the standard protocol without IV contrast. RADIATION DOSE REDUCTION: This exam was performed according to the departmental dose-optimization program which includes automated exposure control, adjustment of the mA and/or kV according to patient size and/or use of iterative reconstruction technique. COMPARISON:  Chest radiograph performed earlier on the same date FINDINGS: Cardiovascular: The heart is normal in size. No pericardial effusion. Prominent coronary artery and aortic atherosclerotic calcifications. 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 Mediastinum/Nodes: No enlarged mediastinal or axillary lymph nodes. Thyroid gland,  trachea, and esophagus demonstrate no significant findings. Lungs/Pleura: Large consolidation in the dependent portion of the right upper lobe with air bronchograms consistent with pneumonia. Emphysematous changes of the lungs. No significant pleural effusion or pneumothorax. Upper Abdomen: Multiple large cysts in the upper pole of the left kidney. Musculoskeletal: No acute osseous abnormality. Thoracic spondylosis with dextroscoliosis. IMPRESSION: 1. Large consolidation in the dependent portion of the right upper lobe with air bronchograms consistent with pneumonia. No appreciable mass. Follow-up examination to resolution is recommended. 2. Emphysematous changes of the lungs. 3. Prominent coronary artery and aortic atherosclerotic calcifications. 4. Multiple large cysts in the upper pole of the left kidney. 5. Aortic atherosclerosis. Aortic Atherosclerosis (ICD10-I70.0) and Emphysema (ICD10-J43.9). Electronically Signed   By: Keane Police D.O.   On: 06/01/2022 14:00   DG Chest 1 View  Result Date: 06/01/2022 CLINICAL DATA:   Shortness of breath. EXAM: CHEST  1 VIEW COMPARISON:  02/02/2022 FINDINGS: Low volume film. Interval development of diffuse airspace disease in the right upper lobe, most suggestive of pneumonia. Minimal atelectasis noted at the left lung base with possible tiny left effusion. Left permanent pacemaker again noted. IMPRESSION: Interval development of diffuse airspace disease in the right upper lobe, most suggestive of pneumonia. Close follow-up recommended to ensure resolution. CT chest could be used to exclude central obstructing mass lesion as clinically warranted. Electronically Signed   By: Misty Stanley M.D.   On: 06/01/2022 12:26        Scheduled Meds:  albuterol  2.5 mg Nebulization Q6H   droxidopa  100 mg Oral TID WC   feeding supplement  237 mL Oral BID BM   finasteride  5 mg Oral Daily   fludrocortisone  0.1 mg Oral Daily   guaiFENesin  600 mg Oral BID   heparin  5,000 Units Subcutaneous Q8H   mirtazapine  45 mg Oral QHS   mometasone-formoterol  2 puff Inhalation BID   pantoprazole (PROTONIX) IV  40 mg Intravenous Q12H   sodium chloride flush  3 mL Intravenous Q12H   sodium chloride flush  3 mL Intravenous Q12H   thiamine  100 mg Oral Daily   Continuous Infusions:  sodium chloride 75 mL/hr at 06/02/22 2232   cefTRIAXone (ROCEPHIN)  IV Stopped (06/02/22 1539)     LOS: 2 days    Time spent: 56 minutes spent on chart review, discussion with nursing staff, consultants, updating family and interview/physical exam; more than 50% of that time was spent in counseling and/or coordination of care.    Lumir Demetriou J [place] DO [facility] Available via Epic secure chat 7am-7pm After these hours, please refer to coverage provider listed on [email or web address] 06/03/2022, 10:41 AM

## 2022-06-03 NOTE — TOC Initial Note (Signed)
Transition of Care [facility]) - Initial/Assessment Note    Patient Details  Name: Alan [name] MRN: [id number] Date of Birth: May 29, 1941  [facility] CM/SW Contact:    Alan Labrum, RN Phone Number: 06/03/2022, 12:18 PM  Clinical Narrative:                 CM met with the patient at the bedside to discuss transitions of care needs - admission for RUL pneumonia.  The patient is currently on RA.    The patient lives at home with his wife and has personal care services through private pay resources for a nursing assistant 2-3 times per week that is arranged by the patient and his wife.  Patient was currently active with [facility] and patient was agreeable for [facility] PT with [facility] after Medicare choice was given.  I called Alan [name], CM with [facility] and she accepted the patient for [facility] PT.  Orders placed to be co-signed by attending MD.  Patient's wife will provide transportation to home by car once medically ready for discharge.  Patient is current smoker - smoking cessation resources placed in the instructions.  Expected Discharge Plan: [place] Barriers to Discharge: Continued Medical Work up   Patient Goals and CMS Choice Patient states their goals for this hospitalization and ongoing recovery are:: To get better and return home with wife CMS Medicare.gov Compare Post Acute Care list provided to:: Patient Choice offered to / list presented to : Patient  Expected Discharge Plan and Services Expected Discharge Plan: [place]   Discharge Planning Services: CM Consult Post Acute Care Choice: [place] arrangements for the past 2 months: Single Family Home                           HH Arranged: PT [place]: [place] Date [place]: 06/03/22 Time [place]: 1217 Representative spoke with at [place]: Alan [name], [place] with [facility] agency  Prior Living Arrangements/Services Living  arrangements for the past 2 months: [place] Lives with:: Spouse Patient language and need for interpreter reviewed:: Yes Do you feel safe going back to the place where you live?: Yes      Need for Family Participation in Patient Care: Yes (Comment) Care giver support system in place?: Yes (comment) Current home services: DME (DME at home includes RW, rollator, WC) Criminal Activity/Legal Involvement Pertinent to Current Situation/Hospitalization: No - Comment as needed  Activities of Daily Living Home Assistive Devices/Equipment: Environmental consultant (specify type), Wheelchair ADL Screening (condition at time of admission) Patient's cognitive ability adequate to safely complete daily activities?: No Is the patient deaf or have difficulty hearing?: Yes Does the patient have difficulty seeing, even when wearing glasses/contacts?: Yes Does the patient have difficulty concentrating, remembering, or making decisions?: No Patient able to express need for assistance with ADLs?: Yes Does the patient have difficulty dressing or bathing?: Yes Independently performs ADLs?: No Communication: Independent Dressing (OT): Needs assistance Grooming: Needs assistance Feeding: Independent Bathing: Needs assistance Toileting: Needs assistance In/Out Bed: Needs assistance Walks in Home: Needs assistance Is this a change from baseline?: Change from baseline, expected to last >3 days Does the patient have difficulty walking or climbing stairs?: Yes Weakness of Legs: Both Weakness of Arms/Hands: None  Permission Sought/Granted Permission sought to share information with : Case Manager, Family Supports, Customer service manager Permission granted to share information with :  Yes, Verbal Permission Granted     Permission granted to share info w AGENCY: [facility] agency of choice - Alan [name]  Permission granted to share info w Relationship: wife Alan [name], [phone]     Emotional  Assessment Appearance:: Appears stated age Attitude/Demeanor/Rapport: Gracious Affect (typically observed): Accepting Orientation: : Oriented to Self, Oriented to Place, Oriented to  Time, Oriented to Situation Alcohol / Substance Use: Tobacco Use Psych Involvement: No (comment)  Admission diagnosis:  CAP (community acquired pneumonia) [J18.9] Acute kidney injury ([place]) [N17.9] Pneumonia of right upper lobe due to infectious organism [J18.9] Patient Active Problem List   Diagnosis Date Noted   CAP (community acquired pneumonia) 06/01/2022   Leukocytosis 06/01/2022   Anemia of chronic disease 06/01/2022   Hypertensive urgency 06/01/2022   Weakness 06/01/2022   Underweight 06/01/2022   Neurogenic orthostatic hypotension ([place]) 03/13/2022   Protein-calorie malnutrition, severe 02/05/2022   CKD (chronic kidney disease), stage IV (HCC) 02/02/2022   Paroxysmal atrial fibrillation ([place]) 01/31/2022   History of pulmonary embolism 01/31/2022   Acute kidney injury superimposed on chronic kidney disease ([place]) 01/28/2022   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   Anemia due to acquired thiamine deficiency 12/10/2021   Oropharyngeal dysphagia 06/27/2021   Short gut syndrome    Major depressive disorder 04/22/2021   Sinus node dysfunction ([place]) 04/22/2021   Atrial fibrillation with rapid ventricular response ([place]) 04/22/2021   AV block, 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   Iron deficiency anemia due to chronic blood loss 02/20/2021   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[place]) 01/18/2021   GIB (gastrointestinal bleeding) 01/18/2021   Essential hypertension 01/18/2021   PCP:  Alan Lima, MD Pharmacy:   [id number] [place] [place] [address]Phone:: [phone]:: [phone] [facility] [address] [facility] [address] [id number]: [phone]: [phone] [facility] [address]Phone: [phone]: [phone] [facility] [address] [address] [place]Phone: [phone]: [phone]    Social Determinants of Health (SDOH) Interventions    Readmission Risk Interventions    06/03/2022   12:18 PM 02/05/2022   12:11 PM  Readmission Risk Prevention Plan  Transportation Screening Complete Complete  PCP or Specialist Appt within 5-7 Days Complete Complete  Home Care Screening Complete Complete  Medication Review (RN CM) Complete Complete

## 2022-06-03 NOTE — Progress Notes (Signed)
Mobility Specialist Progress Note:   06/03/22 1028  Mobility  Activity Refused mobility   Pt refused mobility d/t not feeling like it today. Educated on importance of mobility. Will f/u as able.    Andrey Campanile Mobility Specialist Please contact via SecureChat or  Rehab office at [phone]

## 2022-06-03 NOTE — Progress Notes (Signed)
Initial Nutrition Assessment  DOCUMENTATION CODES:   Severe malnutrition in context of chronic illness  INTERVENTION:  - Increase Ensure to TID.   - Add Magic cup TID with meals, each supplement provides 290 kcal and 9 grams of protein   - Add Renal MVI.   NUTRITION DIAGNOSIS:   Severe Malnutrition related to chronic illness as evidenced by severe fat depletion, severe muscle depletion, energy intake < or equal to 75% for > or equal to 1 month.  GOAL:   Patient will meet greater than or equal to 90% of their needs  MONITOR:   PO intake, Supplement acceptance  REASON FOR ASSESSMENT:   Consult Assessment of nutrition requirement/status  ASSESSMENT:   81 y.o. male admits related to SOB. PMH includes: asthma, carcinoid tumor, chronic diarrhea, CKD, HTN, short gut syndrome. Pt is currently receiving medical management related to CAP.  Meds include: remeron, thiamine. Labs reviewed: K low, BUN/Creatinine elevated.   Pt reports that he has not been eating well since admission. He reports that he has not had a good appetite for about 2 years now and has lost about 140 lbs over this time frame. Pt is severely malnourished. Pt reports that he does drink Ensure at home. Pt current has Ensure BID ordered. RD will increase to TID. RD will also add Magic Cup TID as well.   Recommend goals of care discussion related to aggressive nutrition as pt will likely not be able to sustain weight with PO intakes alone. Spoke with MD and no plans for alternate means of nutrition as of now in setting of bacteremia. Will continue to monitor POC.   NUTRITION - FOCUSED PHYSICAL EXAM:  Flowsheet Row Most Recent Value  Orbital Region Severe depletion  Upper Arm Region Severe depletion  Thoracic and Lumbar Region Severe depletion  Buccal Region Severe depletion  Temple Region Severe depletion  Clavicle Bone Region Severe depletion  Clavicle and Acromion Bone Region Severe depletion  Scapular Bone  Region Severe depletion  Dorsal Hand Severe depletion  Patellar Region Severe depletion  Anterior Thigh Region Severe depletion  Posterior Calf Region Severe depletion  Edema (RD Assessment) None  Hair Reviewed  Eyes Reviewed  Mouth Reviewed  Skin Reviewed  Nails Reviewed       Diet Order:   Diet Order             Diet regular Room service appropriate? Yes; Fluid consistency: Thin  Diet effective now                   EDUCATION NEEDS:   Not appropriate for education at this time  Skin:  Skin Assessment: Reviewed RN Assessment  Last BM:  05/29/22  Height:   Ht Readings from Last 1 Encounters:  06/01/22 '6\' 1"'$  (1.854 m)    Weight:   Wt Readings from Last 1 Encounters:  06/01/22 60 kg    Ideal Body Weight:     BMI:  Body mass index is 17.45 kg/m.  Estimated Nutritional Needs:   Kcal:  1800-2100 kcals  Protein:  90-105 gm  Fluid:  >/= 1.8 L  Thalia Bloodgood, RD, LDN, CNSC

## 2022-06-04 DIAGNOSIS — A403 Sepsis due to Streptococcus pneumoniae: Secondary | ICD-10-CM | POA: Diagnosis not present

## 2022-06-04 LAB — BASIC METABOLIC PANEL
Anion gap: 13 (ref 5–15)
BUN: 77 mg/dL — ABNORMAL HIGH (ref 8–23)
CO2: 16 mmol/L — ABNORMAL LOW (ref 22–32)
Calcium: 7.7 mg/dL — ABNORMAL LOW (ref 8.9–10.3)
Chloride: 110 mmol/L (ref 98–111)
Creatinine, Ser: 4.5 mg/dL — ABNORMAL HIGH (ref 0.61–1.24)
GFR, Estimated: 13 mL/min — ABNORMAL LOW (ref >60)
Glucose, Bld: 132 mg/dL — ABNORMAL HIGH (ref 70–99)
Potassium: 3.6 mmol/L (ref 3.5–5.1)
Sodium: 139 mmol/L (ref 135–145)

## 2022-06-04 LAB — CULTURE, BLOOD (ROUTINE X 2): Special Requests: ADEQUATE

## 2022-06-04 LAB — TYPE AND SCREEN
ABO/RH(D): A POS
Antibody Screen: NEGATIVE
Unit division: 0
Unit division: 0

## 2022-06-04 LAB — BPAM RBC
Blood Product Expiration Date: 202312202359
Blood Product Expiration Date: 202312202359
ISSUE DATE / TIME: 202312061151
ISSUE DATE / TIME: 202312061549
Unit Type and Rh: 6200
Unit Type and Rh: 6200

## 2022-06-04 LAB — CBC
HCT: 31.6 % — ABNORMAL LOW (ref 39.0–52.0)
Hemoglobin: 11.5 g/dL — ABNORMAL LOW (ref 13.0–17.0)
MCH: 30.5 pg (ref 26.0–34.0)
MCHC: 36.4 g/dL — ABNORMAL HIGH (ref 30.0–36.0)
MCV: 83.8 fL (ref 80.0–100.0)
Platelets: 257 10*3/uL (ref 150–400)
RBC: 3.77 MIL/uL — ABNORMAL LOW (ref 4.22–5.81)
RDW: 13.7 % (ref 11.5–15.5)
WBC: 14.6 10*3/uL — ABNORMAL HIGH (ref 4.0–10.5)
nRBC: 0.1 % (ref 0.0–0.2)

## 2022-06-04 NOTE — Care Management Important Message (Signed)
Important Message  Patient Details  Name: Alan [name] MRN: [id number] Date of Birth: 08-Mar-1941   Medicare Important Message Given:  Yes     Orbie Pyo 06/04/2022, 2:23 PM

## 2022-06-04 NOTE — Progress Notes (Signed)
   [place] has been requested to perform a transesophageal echocardiogram on B[place] for bacteremia.  After careful review of history and examination, the risks and benefits of transesophageal echocardiogram have been explained including risks of esophageal damage, perforation (1:10,000 risk), bleeding, pharyngeal hematoma as well as other potential complications associated with conscious sedation including aspiration, arrhythmia, respiratory failure and death. Alternatives to treatment were discussed, questions were answered. Patient is willing to proceed.   Wife at bedside.  NPO at MN please.  Tami Lin [place], [place]  06/04/2022 3:29 PM

## 2022-06-04 NOTE — Progress Notes (Signed)
Physical Therapy Treatment Patient Details Name: Alan [name] MRN: [id number] DOB: January 26, 1941 Today's Date: 06/04/2022   History of Present Illness 81 y/o male admitted 12/4 with SOB, weakness, RUL PNA, anemia. PMH includes CKD, A-fib, PPM, PE, Stage IV carcinoid tumor, chronic diarrhea.    PT Comments    Pt with flat affect, difficulty maintaining eyes open during session but willing to attempt getting up and OOB. Pt with orthostatic hypotension and syncope limiting mobility. Pt with noted STM deficits and physical assist for all mobility and pericare due to incontinent bowel. Pt continues to need assist for all mobility and may need to consider ST-SNF if strength and BP not improved acutely.   Orthostatic BPs  Supine 157/91 (111), HR 75  Sitting 110/66 (81), HR 76     Standing 61/40 (48), HR 80       Return to supine intiially 86/158 (67) with return to 126/85 (99) and HR 90 after 2 min   Recommendations for follow up therapy are one component of a multi-disciplinary discharge planning process, led by the attending physician.  Recommendations may be updated based on patient status, additional functional criteria and insurance authorization.  Follow Up Recommendations  Home health PT     Assistance Recommended at Discharge Frequent or constant Supervision/Assistance  Patient can return home with the following A little help with walking and/or transfers;A little help with bathing/dressing/bathroom;Assist for transportation;Help with stairs or ramp for entrance   Equipment Recommendations  None recommended by PT    Recommendations for Other Services       Precautions / Restrictions Precautions Precautions: Fall;Other (comment) Precaution Comments: hx of orthostatic hypotension, dizzy spells, incontinent bowel/bladder     Mobility  Bed Mobility Overal bed mobility: Needs Assistance Bed Mobility: Supine to Sit, Sit to Supine     Supine to sit: Min guard, HOB  elevated Sit to supine: Total assist   General bed mobility comments: minguard with rail, increased time and effort to elevate trunk and pivot to EOB. Return to bed total assist due to syncope with +2 max assist to slide toward [facility]    Transfers Overall transfer level: Needs assistance   Transfers: Sit to/from Stand Sit to Stand: Min assist           General transfer comment: min assist to rise from bed with immediate dizziness, unable to maintain standing and return to sitting with syncope prior to achieving BP reading    Ambulation/Gait               General Gait Details: unable   Stairs             Wheelchair Mobility    Modified Rankin (Stroke Patients Only)       Balance Overall balance assessment: Needs assistance, History of Falls Sitting-balance support: Feet supported, No upper extremity supported Sitting balance-Leahy Scale: Fair Sitting balance - Comments: static sitting with rounded shoulders and kyphotic posture with guarding   Standing balance support: Bilateral upper extremity supported Standing balance-Leahy Scale: Poor Standing balance comment: bil UE support on RW                            Cognition Arousal/Alertness: Awake/alert Behavior During Therapy: Flat affect Overall Cognitive Status: Impaired/Different from baseline Area of Impairment: Attention, Memory                   Current Attention Level: Sustained Memory: Decreased short-term memory  General Comments: pt with decreased memory asking repeatedly about positioning of table and gown change despite tasks having been discussed or complete.        Exercises General Exercises - Lower Extremity Heel Slides: AAROM, Both, 10 reps, Supine    General Comments        Pertinent Vitals/Pain Pain Assessment Pain Assessment: No/denies pain    Home Living                          Prior Function            PT Goals (current  goals can now be found in the care plan section) Progress towards PT goals: Not progressing toward goals - comment (orthostatic)    Frequency    Min 3X/week      PT Plan Current plan remains appropriate    Co-evaluation              AM-PAC PT "6 Clicks" Mobility   Outcome Measure  Help needed turning from your back to your side while in a flat bed without using bedrails?: A Little Help needed moving from lying on your back to sitting on the side of a flat bed without using bedrails?: A Little Help needed moving to and from a bed to a chair (including a wheelchair)?: A Lot Help needed standing up from a chair using your arms (e.g., wheelchair or bedside chair)?: A Little Help needed to walk in hospital room?: Total Help needed climbing 3-5 steps with a railing? : Total 6 Click Score: 13    End of Session Equipment Utilized During Treatment: Gait belt Activity Tolerance: Patient limited by fatigue;Other (comment) (hypotension) Patient left: in bed;with call bell/phone within reach;with bed alarm set Nurse Communication: Mobility status PT Visit Diagnosis: Muscle weakness (generalized) (M62.81);Unsteadiness on feet (R26.81);Difficulty in walking, not elsewhere classified (R26.2)     Time: 4503-8882 PT Time Calculation (min) (ACUTE ONLY): 38 min  Charges:  $Therapeutic Activity: 38-52 mins                     Bayard Males, PT Acute Rehabilitation Services Office: [phone]    Sandy Salaam Kush [place] 06/04/2022, 12:11 PM

## 2022-06-04 NOTE — Anesthesia Preprocedure Evaluation (Addendum)
Anesthesia Evaluation  Patient identified by MRN, date of birth, ID band Patient awake    Reviewed: Allergy & Precautions, NPO status , Patient's Chart, lab work & pertinent test results  Airway Mallampati: II  TM Distance: >3 FB Neck ROM: Full    Dental  (+) Dental Advisory Given, Partial Lower, Missing   Pulmonary asthma , Current Smoker   Pulmonary exam normal breath sounds clear to auscultation       Cardiovascular hypertension, Normal cardiovascular exam+ dysrhythmias  Rhythm:Regular Rate:Normal     Neuro/Psych  PSYCHIATRIC DISORDERS  Depression    negative neurological ROS     GI/Hepatic Neg liver ROS,,,Carcinoid tumor, small intestine, malignant   Endo/Other  negative endocrine ROS    Renal/GU Renal InsufficiencyRenal disease     Musculoskeletal negative musculoskeletal ROS (+)    Abdominal   Peds  Hematology  (+) Blood dyscrasia, anemia BACTEREMIA   Anesthesia Other Findings   Reproductive/Obstetrics                             Anesthesia Physical Anesthesia Plan  ASA: 4  Anesthesia Plan: MAC   Post-op Pain Management: Minimal or no pain anticipated   Induction: Intravenous  PONV Risk Score and Plan: 0 and TIVA and Treatment may vary due to age or medical condition  Airway Management Planned: Natural Airway and Simple Face Mask  Additional Equipment:   Intra-op Plan:   Post-operative Plan:   Informed Consent: I have reviewed the patients History and Physical, chart, labs and discussed the procedure including the risks, benefits and alternatives for the proposed anesthesia with the patient or authorized representative who has indicated his/her understanding and acceptance.     Dental advisory given  Plan Discussed with: CRNA  Anesthesia Plan Comments:        Anesthesia Quick Evaluation

## 2022-06-04 NOTE — Progress Notes (Addendum)
Mobility Specialist Progress Note   06/04/22 1345  Mobility  Activity Moved into chair position in bed (Bed level exercises)  Level of Assistance Standby assist, set-up cues, supervision of patient - no hands on  Assistive Device None  Range of Motion/Exercises Active;All extremities  Activity Response Tolerated well   Patient received in supine and agreeable to participate. Opted to bed level exercises second to patient being hypotensive with syncope during PT session. Completed series of LE and UE exercises with supervision. Tolerated without complaint or incident but was limited by fatigue and weakness. Was left in supine with all needs met, call bell in reach.   Alan [name], BS EXP Mobility Specialist Please contact via SecureChat or Rehab office at [phone]

## 2022-06-04 NOTE — Consult Note (Signed)
[facility] for Infectious Disease       Reason for Consult: bacteremia   Referring Physician: Dr. 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[place])   Iron deficiency anemia due to chronic blood loss   AV block, 2nd degree   Acute kidney injury superimposed on chronic kidney disease (HCC)   Paroxysmal atrial fibrillation (HCC)   CKD (chronic kidney disease), stage IV (HCC)   Protein-calorie malnutrition, severe   CAP (community acquired pneumonia)   Leukocytosis   Anemia of chronic disease   Hypertensive urgency   Weakness   Underweight    droxidopa  100 mg Oral TID WC   feeding supplement  237 mL Oral TID BM   finasteride  5 mg Oral Daily   fludrocortisone  0.1 mg Oral Daily   guaiFENesin  600 mg Oral BID   heparin  5,000 Units Subcutaneous Q8H   mirtazapine  45 mg Oral QHS   mometasone-formoterol  2 puff Inhalation BID   multivitamin  1 tablet Oral QHS   pantoprazole (PROTONIX) IV  40 mg Intravenous Q12H   sodium chloride flush  3 mL Intravenous Q12H   sodium chloride flush  3 mL Intravenous Q12H   thiamine  100 mg Oral Daily    Recommendations: TEE - I have requested via card master Monitor blood cultures Will continue with ceftriaxone   Assessment: He has bacteremia with Strep pneumoniae.  Though lower probability of endocarditis, with the pacemaker in place I do recommend proceeding with a TEE.    HPI: Alan [name] is a 81 y.o. 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  Blood cultures positive for Strep pneumoniae in multiple bottles.  No fever but leukocytosis of 24.6, now down to 14.6.  He has been weakened.  Repeat blood culture sent yesterday and ngtd.  No recent issues with his pacemaker to his concern.  Some  recent progressive worsening cough.  Poor po intake.     Review of Systems:  Constitutional: negative for fevers All other systems reviewed and are negative    Past Medical History:  Diagnosis Date   Asthma    Carcinoid tumor, small intestine, malignant (HCC)    Chronic diarrhea    CKD (chronic kidney disease) stage 3, GFR 30-59 ml/min (HCC)    HTN (hypertension)    Short gut syndrome     Social History   Tobacco Use   Smoking status: Some Days    Packs/day: 0.30    Years: 61.00    Total pack years: 18.30    Types: Cigars, Cigarettes    Start date: 57    Last attempt to quit: 03/03/2020    Years since quitting: 2.2   Smokeless tobacco: Never   Tobacco comments:    Former Risk manager Use: Never used  Substance Use Topics   Alcohol use: Not Currently   Drug use: No    Family History  Problem Relation Age of Onset   Alzheimer's disease Mother     Allergies  Allergen Reactions   Penicillins Hives and Shortness Of Breath   Lisinopril Itching    Physical Exam: Constitutional: in no apparent distress  Vitals:   06/04/22 0712 06/04/22 0824  BP:  (!) 150/91  Pulse:  72  Resp:  14  Temp:  97.7 F (36.5 C)  SpO2: 100% 100%   EYES: anicteric Chest: pacemaker without warmth Respiratory: normal respiratory effort Musculoskeletal: no edema  Lab Results  Component Value Date   WBC 14.6 (H) 06/04/2022   HGB 11.5 (L) 06/04/2022   HCT 31.6 (L) 06/04/2022   MCV 83.8 06/04/2022   PLT 257 06/04/2022    Lab Results  Component Value Date   CREATININE 4.50 (H) 06/04/2022   BUN 77 (H) 06/04/2022   NA 139 06/04/2022   K 3.6 06/04/2022   CL 110 06/04/2022   CO2 16 (L) 06/04/2022    Lab Results  Component Value Date   ALT 13 06/01/2022   AST 14 (L) 06/01/2022   ALKPHOS 43 06/01/2022     Microbiology: Recent Results (from the past 240 hour(s))  Resp Panel by RT-PCR (Flu A&B, Covid) Anterior Nasal Swab     Status: None   Collection Time:  06/01/22 11:58 AM   Specimen: Anterior Nasal Swab  Result Value Ref Range Status   SARS Coronavirus 2 by RT PCR NEGATIVE NEGATIVE Final    Comment: (NOTE) SARS-CoV-2 target nucleic acids are NOT DETECTED.  The SARS-CoV-2 RNA is generally detectable in upper respiratory specimens during the acute phase of infection. The lowest concentration of SARS-CoV-2 viral copies this assay can detect is 138 copies/mL. A negative result does not preclude SARS-Cov-2 infection and should not be used as the sole basis for treatment or other patient management decisions. A negative result may occur with  improper specimen collection/handling, submission of specimen other than nasopharyngeal swab, presence of viral mutation(s) within the areas targeted by this assay, and inadequate number of viral copies(<138 copies/mL). A negative result must be combined with clinical observations, patient history, and epidemiological information. The expected result is Negative.  Fact Sheet for Patients:  EntrepreneurPulse.com.au  Fact Sheet for Healthcare Providers:  IncredibleEmployment.be  This test is no t yet approved or cleared by the [place] FDA and  has been authorized for detection and/or diagnosis of SARS-CoV-2 by FDA under an Emergency Use Authorization (EUA). This EUA will remain  in effect (meaning this test can be used) for the duration of the COVID-19 declaration under Section 564(b)(1) of the Act, 21 U.S.C.section 360bbb-3(b)(1), unless the authorization is terminated  or revoked sooner.       Influenza A by PCR NEGATIVE NEGATIVE Final   Influenza B by PCR NEGATIVE NEGATIVE Final    Comment: (NOTE) The Xpert Xpress SARS-CoV-2/FLU/RSV plus assay is intended as an aid in the diagnosis of influenza from Nasopharyngeal swab specimens and should not be used as a sole basis for treatment. Nasal washings and aspirates are unacceptable for Xpert Xpress  SARS-CoV-2/FLU/RSV testing.  Fact Sheet for Patients: EntrepreneurPulse.com.au  Fact Sheet for Healthcare Providers: IncredibleEmployment.be  This test is not yet approved or cleared by the [place] FDA and has been authorized for detection and/or diagnosis of SARS-CoV-2 by FDA under an Emergency Use Authorization (EUA). This EUA will remain in effect (meaning this test can be used) for the duration of the COVID-19 declaration under Section 564(b)(1) of the Act, 21 U.S.C. section 360bbb-3(b)(1), unless the authorization is terminated or revoked.  Performed at [facility], [place] [address], [place], [place] [address]   Culture, blood (routine x 2)     Status: Abnormal   Collection Time: 06/01/22  3:48 PM   Specimen: BLOOD LEFT FOREARM  Result Value Ref Range Status   Specimen  Description BLOOD LEFT FOREARM  Final   Special Requests   Final    BOTTLES DRAWN AEROBIC AND ANAEROBIC Blood Culture adequate volume   Culture  Setup Time   Final    GRAM POSITIVE COCCI IN CHAINS IN BOTH AEROBIC AND ANAEROBIC BOTTLES CRITICAL RESULT CALLED TO, READ BACK BY AND VERIFIED WITH: [place] ON 06/02/22 @ 1538 BY DRT Performed at [facility], [place] [address] [place], [place] [address]    Culture STREPTOCOCCUS PNEUMONIAE (A)  Final   Report Status 06/04/2022 FINAL  Final   Organism ID, Bacteria STREPTOCOCCUS PNEUMONIAE  Final      Susceptibility   Streptococcus pneumoniae - MIC*    ERYTHROMYCIN <=0.12 SENSITIVE Sensitive     LEVOFLOXACIN 0.5 SENSITIVE Sensitive     VANCOMYCIN <=0.12 SENSITIVE Sensitive     PENICILLIN (meningitis) 0.25 RESISTANT Resistant     PENO - penicillin 0.25      PENICILLIN (non-meningitis) 0.25 SENSITIVE Sensitive     PENICILLIN (oral) 0.25 INTERMEDIATE Intermediate     CEFTRIAXONE (non-meningitis) <=0.12 SENSITIVE Sensitive     CEFTRIAXONE (meningitis) <=0.12 SENSITIVE Sensitive     * STREPTOCOCCUS  PNEUMONIAE  Blood Culture ID Panel (Reflexed)     Status: Abnormal   Collection Time: 06/01/22  3:48 PM  Result Value Ref Range Status   Enterococcus faecalis NOT DETECTED NOT DETECTED Final   Enterococcus Faecium NOT DETECTED NOT DETECTED Final   Listeria monocytogenes NOT DETECTED NOT DETECTED Final   Staphylococcus species NOT DETECTED NOT DETECTED Final   Staphylococcus aureus (BCID) NOT DETECTED NOT DETECTED Final   Staphylococcus epidermidis NOT DETECTED NOT DETECTED Final   Staphylococcus lugdunensis NOT DETECTED NOT DETECTED Final   Streptococcus species DETECTED (A) NOT DETECTED Final    Comment: CRITICAL RESULT CALLED TO, READ BACK BY AND VERIFIED WITH: PHARMD ANDREW MEYER ON 06/02/22 @ 1538 BY DRT    Streptococcus agalactiae NOT DETECTED NOT DETECTED Final   Streptococcus pneumoniae DETECTED (A) NOT DETECTED Final    Comment: CRITICAL RESULT CALLED TO, READ BACK BY AND VERIFIED WITH: PHARMD ANDREW MEYER ON 06/02/22 @ 1538 BY DRT    Streptococcus pyogenes NOT DETECTED NOT DETECTED Final   A.calcoaceticus-baumannii NOT DETECTED NOT DETECTED Final   Bacteroides fragilis NOT DETECTED NOT DETECTED Final   Enterobacterales NOT DETECTED NOT DETECTED Final   Enterobacter cloacae complex NOT DETECTED NOT DETECTED Final   Escherichia coli NOT DETECTED NOT DETECTED Final   Klebsiella aerogenes NOT DETECTED NOT DETECTED Final   Klebsiella oxytoca NOT DETECTED NOT DETECTED Final   Klebsiella pneumoniae NOT DETECTED NOT DETECTED Final   Proteus species NOT DETECTED NOT DETECTED Final   Salmonella species NOT DETECTED NOT DETECTED Final   Serratia marcescens NOT DETECTED NOT DETECTED Final   Haemophilus influenzae NOT DETECTED NOT DETECTED Final   Neisseria meningitidis NOT DETECTED NOT DETECTED Final   Pseudomonas aeruginosa NOT DETECTED NOT DETECTED Final   Stenotrophomonas maltophilia NOT DETECTED NOT DETECTED Final   Candida albicans NOT DETECTED NOT DETECTED Final   Candida auris  NOT DETECTED NOT DETECTED Final   Candida glabrata NOT DETECTED NOT DETECTED Final   Candida krusei NOT DETECTED NOT DETECTED Final   Candida parapsilosis NOT DETECTED NOT DETECTED Final   Candida tropicalis NOT DETECTED NOT DETECTED Final   Cryptococcus neoformans/gattii NOT DETECTED NOT DETECTED Final    Comment: Performed at [facility], [place] [address], [place], [place] [address]  Culture, blood (routine x 2)  Status: Abnormal   Collection Time: 06/01/22  4:00 PM   Specimen: BLOOD  Result Value Ref Range Status   Specimen Description BLOOD LEFT ANTECUBITAL  Final   Special Requests   Final    BOTTLES DRAWN AEROBIC AND ANAEROBIC Blood Culture results may not be optimal due to an inadequate volume of blood received in culture bottles   Culture  Setup Time   Final    GRAM POSITIVE COCCI IN CHAINS IN BOTH AEROBIC AND ANAEROBIC BOTTLES    Culture (A)  Final    STREPTOCOCCUS PNEUMONIAE SUSCEPTIBILITIES PERFORMED ON PREVIOUS CULTURE WITHIN THE LAST 5 DAYS. Performed at [facility], [place] [address], [place], [place] [address]    Report Status 06/04/2022 FINAL  Final  Culture, blood (Routine X 2) w Reflex to ID Panel     Status: None (Preliminary result)   Collection Time: 06/03/22 12:47 PM   Specimen: BLOOD RIGHT ARM  Result Value Ref Range Status   Specimen Description BLOOD RIGHT ARM  Final   Special Requests   Final    BOTTLES DRAWN AEROBIC ONLY Blood Culture adequate volume   Culture   Final    NO GROWTH < 24 HOURS Performed at [facility], [place] [address], [place], [place] [address]    Report Status PENDING  Incomplete    Thayer Headings, [place] for Infectious Disease [place] [email or web address] 06/04/2022, 12:16 PM

## 2022-06-04 NOTE — H&P (View-Only) (Signed)
   Alan [name] has been requested to perform a transesophageal echocardiogram on Alan [name] for bacteremia.  After careful review of history and examination, the risks and benefits of transesophageal echocardiogram have been explained including risks of esophageal damage, perforation (1:10,000 risk), bleeding, pharyngeal hematoma as well as other potential complications associated with conscious sedation including aspiration, arrhythmia, respiratory failure and death. Alternatives to treatment were discussed, questions were answered. Patient is willing to proceed.   Wife at bedside.  NPO at MN please.  Tami Lin Jerre [place], [place]  06/04/2022 3:29 PM

## 2022-06-04 NOTE — Progress Notes (Signed)
PROGRESS NOTE    Alan [name]  WNI:[id number] DOB: January 15, 1941 DOA: 06/01/2022 PCP: [name], MD    Brief Narrative:   Alan [name] is a 81 y.o. male with past medical history significant for essential hypertension, atrial fibrillation not on anticoagulation due to history of GIB, malignant carcinoid of the intestines, CKD stage IV, chronic diarrhea who presented to [facility] ED with 3-4-day history of progressively worsening weakness, cough.  Evaluation in the ED notable for leukocytosis, CT chest with right upper lobe pneumonia.  Hospitalist service was consulted for admission for further evaluation management of sepsis secondary to commune acquired pneumonia.  Assessment & Plan:   Sepsis, POA Streptococcus pneumonia bacteremia Community-acquired pneumonia Patient presenting to ED with 4-day history of progressive weakness, cough was found to be tachypneic, with leukocytosis.  CT chest with right upper lobe consolidation.  Blood cultures x 4 positive for Streptococcus pneumonia per [facility].  Complicated by history of PPM in place. TTE with LVEF 35-40%, LV with global hypokinesis, grade 1 diastolic dysfunction, LA moderately dilated, IVC dilated, moderate calcification aortic valve. --WBC 20.6>24.6>18.0>14.6 --Blood cultures x 2 12/4: 4/4 + Streptococcus pneumonia, intermediate to PCN --Repeat blood cultures 12/6: Pending --Continue ceftriaxone 2 g IV every 24 hours --ID consulted for assistance with treatment recommendations/duration --CBC daily  Anemia of chronic medical/renal disease, acute on chronic Hx GI Bleed Patient was seen by gastroenterology, Dr. Michail Sermon on 12/5.  Recent EGD 2022 showing gastritis and colonoscopy 06/2020 with 2 sessile serrated adenomas that were removed.  Patient denies any overt bleeding.  GI recommends holding off on endoscopic procedures with planned repeat surveillance colonoscopy in January 2024.  Anemia panel with iron 11, TIBC 119, ferritin 399, folate  19.9, vitamin B12 554.  Transfused 2 unit PRBCs on 12/6. --Hgb 8.2>9.1>8.4>7.0>11.5 --Protonix 40 mg IV every 12 hours --Hold on IV iron given active infection --CBC daily  Acute renal failure on CKD stage IV Baseline creatinine 2.7-3.0.  Patient likely dehydrated secondary to poor oral intake as well as diarrhea versus ATN from septicemia.  Received IV fluids during hospitalization. --Cr 4.42>4.34>4.74>4.50 --Closely monitor urinary output --BMP daily  Combined systolic/diastolic CHF, compensated TTE with LVEF 35-40%, LV with global hypokinesis, grade 1 diastolic dysfunction.  Previous TTE 01/17/2021 with LVEF 55 to 60%, no LV regional wall motion abnormalities.  Follows with cardiology outpatient.  Denies chest pain.  Will need close follow-up with cardiology on discharge for consideration of ischemic workup outpatient once recovers from septicemia.  Hx paroxysmal atrial fibrillation not on anticoagulation Follows with cardiology outpatient.  Not on anticoagulation due to fall risk, history of GI bleeds.  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 Irene Limbo and palliative care outpatient.  Currently on Lanreotide every 4 weeks  Hx AV block, second-degree s/p PPM Follows with electrophysiology, Dr. Lovena Le outpatient.  Has pacemaker in place.  Hx neurogenic orthostatic hypotension -- Continue droxidopa 100 mg TIDAC -- Fludrocortisone 0.1 mg p.o. daily  Asthma On Symbicort and albuterol as needed outpatient. Ruthe Mannan as hospital substitution inpatient -- Albuterol neb every 6 hours  Severe protein calorie malnutrition Body mass index is 17.45 kg/m.  Nutrition Status: Nutrition Problem: Severe Malnutrition Etiology: chronic illness Signs/Symptoms: severe fat depletion, severe muscle depletion, energy intake < or equal to 75% for > or equal to 1  month Interventions: Ensure Enlive (each supplement provides 350kcal and 20 grams of  protein), Magic cup, MVI -- Dietitian consulted, given his significant weight loss did recommend feeding tube, discussed with patient who states has discussed with his oncologist and recommend further discussion outpatient.  Weakness/debility/deconditioning: -- PT, recommends home health, continue therapy efforts while inpatient   DVT prophylaxis: heparin injection 5,000 Units Start: 06/01/22 2200 Place and maintain sequential compression device Start: 06/01/22 1606    Code Status: Full Code Family Communication: No family present at bedside this morning  Disposition Plan:  Level of care: Med-Surg Status is: Inpatient Remains inpatient appropriate because: Awaiting ID input regarding antibiotic treatment/duration    Consultants:  Infectious disease, Dr. Linus Salmons  Procedures:  None  Antimicrobials:  Azithromycin 12/4 - 12/5 Ceftriaxone 12/4>>   Subjective: Patient seen examined bedside, resting comfortably.  Lying in bed.  Asked when he will be able to go home.  WBC count continues to trend down.  Repeat blood cultures performed yesterday.  Continues on IV antibiotics.  Denies headache, no fever/chills/night sweats, no nausea/vomiting, no abdominal pain, no focal weakness, no chest pain, no palpitations, no paresthesias.  No acute events overnight per nursing staff.  Objective: Vitals:   06/03/22 2056 06/04/22 0432 06/04/22 0712 06/04/22 0824  BP: (!) 167/97 128/88  (!) 150/91  Pulse: 76 78  72  Resp: '16 16  14  '$ Temp: 98.4 F (36.9 C) 98.6 F (37 C)  97.7 F (36.5 C)  TempSrc: Oral Oral    SpO2: 100% 100% 100% 100%  Weight:      Height:        Intake/Output Summary (Last 24 hours) at 06/04/2022 1040 Last data filed at 06/04/2022 0600 Gross per 24 hour  Intake 1330.92 ml  Output 1402 ml  Net -71.08 ml   Filed Weights   06/01/22 1154  Weight: 60 kg    Examination:  Physical  Exam: GEN: NAD, alert and oriented x 3, chronically ill/elderly/thin/cachectic in appearance HEENT: NCAT, PERRL, EOMI, sclera clear, MMM PULM: Breath sounds slightly decreased bilateral bases, no wheezing/rhonchi, normal respiratory effort without accessory muscle use, on room air at rest CV: RRR w/o M/G/R GI: abd soft, NTND, NABS, no R/G/M MSK: no peripheral edema, moves all extremities independently NEURO: CN II-XII intact, no focal deficits, sensation to light touch intact PSYCH: normal mood/affect Integumentary: dry/intact, no rashes or wounds    Data Reviewed: I have personally reviewed following labs and imaging studies  CBC: Recent Labs  Lab 06/01/22 1158 06/01/22 2020 06/02/22 0350 06/03/22 0248 06/04/22 0815  WBC 20.6*  --  24.6* 18.0* 14.6*  HGB 8.2* 9.1* 8.4* 7.0* 11.5*  HCT 22.2* 25.1* 22.4* 19.5* 31.6*  MCV 86.4  --  84.2 84.8 83.8  PLT 236  --  222 248 270   Basic Metabolic Panel: Recent Labs  Lab 06/01/22 1158 06/02/22 0350 06/03/22 0248 06/04/22 0509  NA 137 137 141 139  K 3.6 3.7 3.4* 3.6  CL 115* 112* 109 110  CO2 14* 14* 16* 16*  GLUCOSE 144* 136* 141* 132*  BUN 74* 80* 81* 77*  CREATININE 4.42* 4.34* 4.74* 4.50*  CALCIUM 7.9* 8.1* 8.6* 7.7*   GFR: Estimated Creatinine Clearance: 11.1 mL/min (A) (by C-G formula based on SCr of 4.5 mg/dL (H)). Liver Function Tests: Recent Labs  Lab 06/01/22 1158  AST 14*  ALT 13  ALKPHOS 43  BILITOT 0.7  PROT 5.7*  ALBUMIN 2.4*   No results for input(s): "LIPASE", "AMYLASE" in the last 168 hours. No results for input(s): "AMMONIA" in the last 168  hours. Coagulation Profile: No results for input(s): "INR", "PROTIME" in the last 168 hours. Cardiac Enzymes: No results for input(s): "CKTOTAL", "CKMB", "CKMBINDEX", "TROPONINI" in the last 168 hours. BNP (last 3 results) No results for input(s): "PROBNP" in the last 8760 hours. HbA1C: No results for input(s): "HGBA1C" in the last 72 hours. CBG: No  results for input(s): "GLUCAP" in the last 168 hours. Lipid Profile: No results for input(s): "CHOL", "HDL", "LDLCALC", "TRIG", "CHOLHDL", "LDLDIRECT" in the last 72 hours. Thyroid Function Tests: No results for input(s): "TSH", "T4TOTAL", "FREET4", "T3FREE", "THYROIDAB" in the last 72 hours. Anemia Panel: Recent Labs    06/03/22 0248  VITAMINB12 554  FOLATE 19.9  FERRITIN 339*  TIBC 119*  IRON 11*  RETICCTPCT 1.3   Sepsis Labs: Recent Labs  Lab 06/01/22 2020 06/02/22 0350 06/03/22 0248  PROCALCITON 6.15 5.85 5.25    Recent Results (from the past 240 hour(s))  Resp Panel by RT-PCR (Flu A&B, Covid) Anterior Nasal Swab     Status: None   Collection Time: 06/01/22 11:58 AM   Specimen: Anterior Nasal Swab  Result Value Ref Range Status   SARS Coronavirus 2 by RT PCR NEGATIVE NEGATIVE Final    Comment: (NOTE) SARS-CoV-2 target nucleic acids are NOT DETECTED.  The SARS-CoV-2 RNA is generally detectable in upper respiratory specimens during the acute phase of infection. The lowest concentration of SARS-CoV-2 viral copies this assay can detect is 138 copies/mL. A negative result does not preclude SARS-Cov-2 infection and should not be used as the sole basis for treatment or other patient management decisions. A negative result may occur with  improper specimen collection/handling, submission of specimen other than nasopharyngeal swab, presence of viral mutation(s) within the areas targeted by this assay, and inadequate number of viral copies(<138 copies/mL). A negative result must be combined with clinical observations, patient history, and epidemiological information. The expected result is Negative.  Fact Sheet for Patients:  EntrepreneurPulse.com.au  Fact Sheet for Healthcare Providers:  IncredibleEmployment.be  This test is no t yet approved or cleared by the [place] FDA and  has been authorized for detection and/or diagnosis  of SARS-CoV-2 by FDA under an Emergency Use Authorization (EUA). This EUA will remain  in effect (meaning this test can be used) for the duration of the COVID-19 declaration under Section 564(b)(1) of the Act, 21 U.S.C.section 360bbb-3(b)(1), unless the authorization is terminated  or revoked sooner.       Influenza A by PCR NEGATIVE NEGATIVE Final   Influenza B by PCR NEGATIVE NEGATIVE Final    Comment: (NOTE) The Xpert Xpress SARS-CoV-2/FLU/RSV plus assay is intended as an aid in the diagnosis of influenza from Nasopharyngeal swab specimens and should not be used as a sole basis for treatment. Nasal washings and aspirates are unacceptable for Xpert Xpress SARS-CoV-2/FLU/RSV testing.  Fact Sheet for Patients: EntrepreneurPulse.com.au  Fact Sheet for Healthcare Providers: IncredibleEmployment.be  This test is not yet approved or cleared by the [place] FDA and has been authorized for detection and/or diagnosis of SARS-CoV-2 by FDA under an Emergency Use Authorization (EUA). This EUA will remain in effect (meaning this test can be used) for the duration of the COVID-19 declaration under Section 564(b)(1) of the Act, 21 U.S.C. section 360bbb-3(b)(1), unless the authorization is terminated or revoked.  Performed at [facility], [place] [address] [place], [place] [address]   Culture, blood (routine x 2)     Status: Abnormal   Collection Time: 06/01/22  3:48 PM   Specimen:  BLOOD LEFT FOREARM  Result Value Ref Range Status   Specimen Description BLOOD LEFT FOREARM  Final   Special Requests   Final    BOTTLES DRAWN AEROBIC AND ANAEROBIC Blood Culture adequate volume   Culture  Setup Time   Final    GRAM POSITIVE COCCI IN CHAINS IN BOTH AEROBIC AND ANAEROBIC BOTTLES CRITICAL RESULT CALLED TO, READ BACK BY AND VERIFIED WITH: [place] ON 06/02/22 @ 1538 BY DRT Performed at [facility], [place] [address], [place],  [place] [address]    Culture STREPTOCOCCUS PNEUMONIAE (A)  Final   Report Status 06/04/2022 FINAL  Final   Organism ID, Bacteria STREPTOCOCCUS PNEUMONIAE  Final      Susceptibility   Streptococcus pneumoniae - MIC*    ERYTHROMYCIN <=0.12 SENSITIVE Sensitive     LEVOFLOXACIN 0.5 SENSITIVE Sensitive     VANCOMYCIN <=0.12 SENSITIVE Sensitive     PENICILLIN (meningitis) 0.25 RESISTANT Resistant     PENO - penicillin 0.25      PENICILLIN (non-meningitis) 0.25 SENSITIVE Sensitive     PENICILLIN (oral) 0.25 INTERMEDIATE Intermediate     CEFTRIAXONE (non-meningitis) <=0.12 SENSITIVE Sensitive     CEFTRIAXONE (meningitis) <=0.12 SENSITIVE Sensitive     * STREPTOCOCCUS PNEUMONIAE  Blood Culture ID Panel (Reflexed)     Status: Abnormal   Collection Time: 06/01/22  3:48 PM  Result Value Ref Range Status   Enterococcus faecalis NOT DETECTED NOT DETECTED Final   Enterococcus Faecium NOT DETECTED NOT DETECTED Final   Listeria monocytogenes NOT DETECTED NOT DETECTED Final   Staphylococcus species NOT DETECTED NOT DETECTED Final   Staphylococcus aureus (BCID) NOT DETECTED NOT DETECTED Final   Staphylococcus epidermidis NOT DETECTED NOT DETECTED Final   Staphylococcus lugdunensis NOT DETECTED NOT DETECTED Final   Streptococcus species DETECTED (A) NOT DETECTED Final    Comment: CRITICAL RESULT CALLED TO, READ BACK BY AND VERIFIED WITH: PHARMD ANDREW MEYER ON 06/02/22 @ 1538 BY DRT    Streptococcus agalactiae NOT DETECTED NOT DETECTED Final   Streptococcus pneumoniae DETECTED (A) NOT DETECTED Final    Comment: CRITICAL RESULT CALLED TO, READ BACK BY AND VERIFIED WITH: PHARMD ANDREW MEYER ON 06/02/22 @ 1538 BY DRT    Streptococcus pyogenes NOT DETECTED NOT DETECTED Final   A.calcoaceticus-baumannii NOT DETECTED NOT DETECTED Final   Bacteroides fragilis NOT DETECTED NOT DETECTED Final   Enterobacterales NOT DETECTED NOT DETECTED Final   Enterobacter cloacae complex NOT DETECTED NOT DETECTED Final    Escherichia coli NOT DETECTED NOT DETECTED Final   Klebsiella aerogenes NOT DETECTED NOT DETECTED Final   Klebsiella oxytoca NOT DETECTED NOT DETECTED Final   Klebsiella pneumoniae NOT DETECTED NOT DETECTED Final   Proteus species NOT DETECTED NOT DETECTED Final   Salmonella species NOT DETECTED NOT DETECTED Final   Serratia marcescens NOT DETECTED NOT DETECTED Final   Haemophilus influenzae NOT DETECTED NOT DETECTED Final   Neisseria meningitidis NOT DETECTED NOT DETECTED Final   Pseudomonas aeruginosa NOT DETECTED NOT DETECTED Final   Stenotrophomonas maltophilia NOT DETECTED NOT DETECTED Final   Candida albicans NOT DETECTED NOT DETECTED Final   Candida auris NOT DETECTED NOT DETECTED Final   Candida glabrata NOT DETECTED NOT DETECTED Final   Candida krusei NOT DETECTED NOT DETECTED Final   Candida parapsilosis NOT DETECTED NOT DETECTED Final   Candida tropicalis NOT DETECTED NOT DETECTED Final   Cryptococcus neoformans/gattii NOT DETECTED NOT DETECTED Final    Comment: Performed at [facility], [place]  [address], [place], [place] [address]  Culture, blood (routine x 2)     Status: Abnormal   Collection Time: 06/01/22  4:00 PM   Specimen: BLOOD  Result Value Ref Range Status   Specimen Description BLOOD LEFT ANTECUBITAL  Final   Special Requests   Final    BOTTLES DRAWN AEROBIC AND ANAEROBIC Blood Culture results may not be optimal due to an inadequate volume of blood received in culture bottles   Culture  Setup Time   Final    GRAM POSITIVE COCCI IN CHAINS IN BOTH AEROBIC AND ANAEROBIC BOTTLES    Culture (A)  Final    STREPTOCOCCUS PNEUMONIAE SUSCEPTIBILITIES PERFORMED ON PREVIOUS CULTURE WITHIN THE LAST 5 DAYS. Performed at [facility], [place] [address], [place], [place] [address]    Report Status 06/04/2022 FINAL  Final  Culture, blood (Routine X 2) w Reflex to ID Panel     Status: None (Preliminary result)   Collection Time: 06/03/22 12:47 PM   Specimen: BLOOD  RIGHT ARM  Result Value Ref Range Status   Specimen Description BLOOD RIGHT ARM  Final   Special Requests   Final    BOTTLES DRAWN AEROBIC ONLY Blood Culture adequate volume   Culture   Final    NO GROWTH < 24 HOURS Performed at [facility], [place] [address], [place], [place] [address]    Report Status PENDING  Incomplete         Radiology Studies: ECHOCARDIOGRAM COMPLETE  Result Date: 06/03/2022    ECHOCARDIOGRAM REPORT   Patient Name:   VIGGO PERKO [facility] Date of Exam: 06/03/2022 Medical Rec #:  [id number]       Height:       73.0 in Accession #:    [id number]      Weight:       132.3 lb Date of Birth:  [birth year]        BSA:          1.805 m Patient Age:    45 years        BP:           149/86 mmHg Patient Gender: M               HR:           70 bpm. Exam Location:  Inpatient Procedure: 2D Echo, 3D Echo, Cardiac Doppler, Color Doppler and Intracardiac            Opacification Agent Indications:    Bacteremia R78.81  History:        Patient has prior history of Echocardiogram examinations, most                 recent 01/17/2021. Arrythmias:Atrial Fibrillation and AV-Block                 2nd, Signs/Symptoms:Shortness of Breath; Risk Factors:Current                 Smoker and Hypertension.  Sonographer:    Greer Pickerel Referring Phys: [id number] Tiffaney Heimann J [place]  Sonographer Comments: Image acquisition challenging due to patient body habitus and Image acquisition challenging due to respiratory motion. IMPRESSIONS  1. Left ventricular ejection fraction, by estimation, is 35 to 40%. The left ventricle has moderately decreased function. The left ventricle demonstrates global hypokinesis. Left ventricular diastolic parameters are consistent with Grade I diastolic dysfunction (impaired relaxation). Prominent apical trabeculations, probably not reaching criteria for LV noncompaction.  2. Right ventricular systolic function  is normal. The right ventricular size is normal. There is mildly elevated pulmonary  artery systolic pressure. The estimated right ventricular systolic pressure is 50.5 mmHg.  3. Left atrial size was moderately dilated.  4. The mitral valve is normal in structure. Trivial mitral valve regurgitation. No evidence of mitral stenosis.  5. The aortic valve is calcified. There is moderate calcification of the aortic valve. Aortic valve regurgitation is not visualized. Aortic valve sclerosis/calcification is present, without any evidence of aortic stenosis. Cannot rule out vegetation on the aortic valve. Consider TEE for complete evaluation.  6. The inferior vena cava is dilated in size with <50% respiratory variability, suggesting right atrial pressure of 15 mmHg. FINDINGS  Left Ventricle: Left ventricular ejection fraction, by estimation, is 35 to 40%. The left ventricle has moderately decreased function. The left ventricle demonstrates global hypokinesis. Definity contrast agent was given IV to delineate the left ventricular endocardial borders. The left ventricular internal cavity size was normal in size. There is no left ventricular hypertrophy. Left ventricular diastolic parameters are consistent with Grade I diastolic dysfunction (impaired relaxation). Right Ventricle: The right ventricular size is normal. No increase in right ventricular wall thickness. Right ventricular systolic function is normal. There is mildly elevated pulmonary artery systolic pressure. The tricuspid regurgitant velocity is 2.73  m/s, and with an assumed right atrial pressure of 8 mmHg, the estimated right ventricular systolic pressure is 39.7 mmHg. Left Atrium: Left atrial size was moderately dilated. Right Atrium: Right atrial size was normal in size. Pericardium: There is no evidence of pericardial effusion. Mitral Valve: The mitral valve is normal in structure. Trivial mitral valve regurgitation. No evidence of mitral valve stenosis. Tricuspid Valve: The tricuspid valve is normal in structure. Tricuspid valve  regurgitation is trivial. Aortic Valve: The aortic valve is calcified. There is moderate calcification of the aortic valve. Aortic valve regurgitation is not visualized. Aortic valve sclerosis/calcification is present, without any evidence of aortic stenosis. Pulmonic Valve: The pulmonic valve was normal in structure. Pulmonic valve regurgitation is not visualized. Aorta: The aortic root is normal in size and structure. Venous: The inferior vena cava is dilated in size with less than 50% respiratory variability, suggesting right atrial pressure of 15 mmHg. IAS/Shunts: No atrial level shunt detected by color flow Doppler. Additional Comments: A device lead is visualized in the right ventricle.  LEFT VENTRICLE PLAX 2D LVOT diam:     2.20 cm      Diastology LV SV:         75           LV e' medial:    6.20 cm/s LV SV Index:   41           LV E/e' medial:  9.9 LVOT Area:     3.80 cm     LV e' lateral:   5.55 cm/s                             LV E/e' lateral: 11.0  LV Volumes (MOD) LV vol d, MOD A2C: 93.5 ml LV vol d, MOD A4C: 139.0 ml LV vol s, MOD A2C: 60.9 ml  3D Volume EF: LV vol s, MOD A4C: 49.4 ml  3D EF:        48 % LV SV MOD A2C:     32.6 ml  LV EDV:       175 ml LV SV MOD A4C:     139.0  ml LV ESV:       90 ml LV SV MOD BP:      62.9 ml  LV SV:        84 ml RIGHT VENTRICLE RV S prime:     14.60 cm/s TAPSE (M-mode): 2.0 cm LEFT ATRIUM             Index        RIGHT ATRIUM           Index LA Vol (A2C):   91.3 ml 50.58 ml/m  RA Area:     16.00 cm LA Vol (A4C):   66.4 ml 36.79 ml/m  RA Volume:   39.80 ml  22.05 ml/m LA Biplane Vol: 79.9 ml 44.27 ml/m  AORTIC VALVE LVOT Vmax:   99.20 cm/s LVOT Vmean:  60.100 cm/s LVOT VTI:    0.197 m  AORTA Ao Root diam: 3.70 cm MITRAL VALVE                TRICUSPID VALVE MV Area (PHT): 2.64 cm     TR Peak grad:   29.8 mmHg MV Decel Time: 287 msec     TR Vmax:        273.00 cm/s MR Peak grad: 2.1 mmHg MR Vmax:      72.90 cm/s    SHUNTS MV E velocity: 61.30 cm/s   Systemic VTI:   0.20 m MV A velocity: 114.00 cm/s  Systemic Diam: 2.20 cm MV E/A ratio:  0.54 Dalton McleanMD Electronically signed by Franki Monte Signature Date/Time: 06/03/2022/4:20:34 PM    Final         Scheduled Meds:  droxidopa  100 mg Oral TID WC   feeding supplement  237 mL Oral TID BM   finasteride  5 mg Oral Daily   fludrocortisone  0.1 mg Oral Daily   guaiFENesin  600 mg Oral BID   heparin  5,000 Units Subcutaneous Q8H   mirtazapine  45 mg Oral QHS   mometasone-formoterol  2 puff Inhalation BID   multivitamin  1 tablet Oral QHS   pantoprazole (PROTONIX) IV  40 mg Intravenous Q12H   sodium chloride flush  3 mL Intravenous Q12H   sodium chloride flush  3 mL Intravenous Q12H   thiamine  100 mg Oral Daily   Continuous Infusions:  cefTRIAXone (ROCEPHIN)  IV Stopped (06/03/22 1649)     LOS: 3 days    Time spent: 49 minutes spent on chart review, discussion with nursing staff, consultants, updating family and interview/physical exam; more than 50% of that time was spent in counseling and/or coordination of care.    Latonya Nelon J [place] DO [facility] Available via Epic secure chat 7am-7pm After these hours, please refer to coverage provider listed on [email or web address] 06/04/2022, 10:40 AM

## 2022-06-05 ENCOUNTER — Encounter: Payer: Self-pay | Admitting: Hematology and Oncology

## 2022-06-05 ENCOUNTER — Encounter (HOSPITAL_COMMUNITY): Payer: Self-pay | Admitting: Internal Medicine

## 2022-06-05 ENCOUNTER — Inpatient Hospital Stay (HOSPITAL_COMMUNITY): Payer: Medicare Other

## 2022-06-05 ENCOUNTER — Ambulatory Visit (HOSPITAL_COMMUNITY): Admit: 2022-06-05 | Payer: PRIVATE HEALTH INSURANCE | Admitting: Cardiovascular Disease

## 2022-06-05 ENCOUNTER — Inpatient Hospital Stay (HOSPITAL_COMMUNITY): Payer: Medicare Other | Admitting: Anesthesiology

## 2022-06-05 ENCOUNTER — Encounter (HOSPITAL_COMMUNITY)
Admission: EM | Disposition: A | Discharge status: Home Health | Payer: Self-pay | Source: Home / Self Care | Attending: Internal Medicine

## 2022-06-05 ENCOUNTER — Other Ambulatory Visit (HOSPITAL_COMMUNITY): Payer: Self-pay

## 2022-06-05 DIAGNOSIS — I083 Combined rheumatic disorders of mitral, aortic and tricuspid valves: Secondary | ICD-10-CM

## 2022-06-05 DIAGNOSIS — I4891 Unspecified atrial fibrillation: Secondary | ICD-10-CM

## 2022-06-05 DIAGNOSIS — A403 Sepsis due to Streptococcus pneumoniae: Secondary | ICD-10-CM | POA: Diagnosis not present

## 2022-06-05 DIAGNOSIS — N289 Disorder of kidney and ureter, unspecified: Secondary | ICD-10-CM

## 2022-06-05 DIAGNOSIS — F17210 Nicotine dependence, cigarettes, uncomplicated: Secondary | ICD-10-CM

## 2022-06-05 DIAGNOSIS — I10 Essential (primary) hypertension: Secondary | ICD-10-CM

## 2022-06-05 DIAGNOSIS — R7881 Bacteremia: Secondary | ICD-10-CM

## 2022-06-05 HISTORY — PX: TEE WITHOUT CARDIOVERSION: SHX5443

## 2022-06-05 LAB — BASIC METABOLIC PANEL
Anion gap: 16 — ABNORMAL HIGH (ref 5–15)
BUN: 76 mg/dL — ABNORMAL HIGH (ref 8–23)
CO2: 19 mmol/L — ABNORMAL LOW (ref 22–32)
Calcium: 8.4 mg/dL — ABNORMAL LOW (ref 8.9–10.3)
Chloride: 108 mmol/L (ref 98–111)
Creatinine, Ser: 4.46 mg/dL — ABNORMAL HIGH (ref 0.61–1.24)
GFR, Estimated: 13 mL/min — ABNORMAL LOW (ref >60)
Glucose, Bld: 124 mg/dL — ABNORMAL HIGH (ref 70–99)
Potassium: 3.3 mmol/L — ABNORMAL LOW (ref 3.5–5.1)
Sodium: 143 mmol/L (ref 135–145)

## 2022-06-05 LAB — CBC
HCT: 31.1 % — ABNORMAL LOW (ref 39.0–52.0)
Hemoglobin: 11.3 g/dL — ABNORMAL LOW (ref 13.0–17.0)
MCH: 30.5 pg (ref 26.0–34.0)
MCHC: 36.3 g/dL — ABNORMAL HIGH (ref 30.0–36.0)
MCV: 83.8 fL (ref 80.0–100.0)
Platelets: 287 10*3/uL (ref 150–400)
RBC: 3.71 MIL/uL — ABNORMAL LOW (ref 4.22–5.81)
RDW: 13.8 % (ref 11.5–15.5)
WBC: 14.6 10*3/uL — ABNORMAL HIGH (ref 4.0–10.5)
nRBC: 0.2 % (ref 0.0–0.2)

## 2022-06-05 SURGERY — ECHOCARDIOGRAM, TRANSESOPHAGEAL
Anesthesia: Monitor Anesthesia Care

## 2022-06-05 MED ORDER — PROPOFOL 10 MG/ML IV BOLUS
INTRAVENOUS | Status: DC | PRN
  Administered 2022-06-05: 30 mg via INTRAVENOUS

## 2022-06-05 MED ORDER — SODIUM CHLORIDE 0.9 % IV SOLN: INTRAVENOUS | Status: DC

## 2022-06-05 MED ORDER — DIPHENHYDRAMINE HCL 50 MG/ML IJ SOLN: 25.0000 mg | Freq: Once | INTRAMUSCULAR | Status: DC | PRN

## 2022-06-05 MED ORDER — AMOXICILLIN 500 MG PO TABS
1000.0000 mg | ORAL_TABLET | Freq: Three times a day (TID) | ORAL | 0 refills | Status: DC
  Filled 2022-06-05: qty 72, 12d supply, fill #0

## 2022-06-05 MED ORDER — EPINEPHRINE 0.3 MG/0.3ML IJ SOAJ: 0.3000 mg | Freq: Once | INTRAMUSCULAR | Status: DC | PRN

## 2022-06-05 MED ORDER — LIDOCAINE 2% (20 MG/ML) 5 ML SYRINGE
INTRAMUSCULAR | Status: DC | PRN
  Administered 2022-06-05: 100 mg via INTRAVENOUS

## 2022-06-05 MED ORDER — AMOXICILLIN 500 MG PO CAPS
1000.0000 mg | ORAL_CAPSULE | Freq: Two times a day (BID) | ORAL | 0 refills | Status: AC
  Filled 2022-06-05: qty 48, 12d supply, fill #0

## 2022-06-05 MED ORDER — PROPOFOL 500 MG/50ML IV EMUL
INTRAVENOUS | Status: DC | PRN
  Administered 2022-06-05: 100 ug/kg/min via INTRAVENOUS

## 2022-06-05 MED ORDER — AMOXICILLIN 500 MG PO CAPS
500.0000 mg | ORAL_CAPSULE | Freq: Once | ORAL | Status: AC
  Administered 2022-06-05: 500 mg via ORAL
  Filled 2022-06-05: qty 1

## 2022-06-05 NOTE — Progress Notes (Signed)
Alan [name] for Infectious Disease   Reason for visit: Follow up on bacteremia  Interval History: TEE negative for vegetation, WBC 14.6, remains afebrile.   Day 5 total antibiotics  Physical Exam: Constitutional:  Vitals:   06/05/22 0830 06/05/22 0840  BP: 127/80 (!) 143/85  Pulse: 73 76  Resp: 18 17  Temp:    SpO2: 100% 100%   patient appears in NAD Respiratory: Normal respiratory effort; CTA B  Review of Systems: Constitutional: negative for fevers and chills  Lab Results  Component Value Date   WBC 14.6 (H) 06/05/2022   HGB 11.3 (L) 06/05/2022   HCT 31.1 (L) 06/05/2022   MCV 83.8 06/05/2022   PLT 287 06/05/2022    Lab Results  Component Value Date   CREATININE 4.46 (H) 06/05/2022   BUN 76 (H) 06/05/2022   NA 143 06/05/2022   K 3.3 (L) 06/05/2022   CL 108 06/05/2022   CO2 19 (L) 06/05/2022    Lab Results  Component Value Date   ALT 13 06/01/2022   AST 14 (L) 06/01/2022   ALKPHOS 43 06/01/2022     Microbiology: Recent Results (from the past 240 hour(s))  Resp Panel by RT-PCR (Flu A&B, Covid) Anterior Nasal Swab     Status: None   Collection Time: 06/01/22 11:58 AM   Specimen: Anterior Nasal Swab  Result Value Ref Range Status   SARS Coronavirus 2 by RT PCR NEGATIVE NEGATIVE Final    Comment: (NOTE) SARS-CoV-2 target nucleic acids are NOT DETECTED.  The SARS-CoV-2 RNA is generally detectable in upper respiratory specimens during the acute phase of infection. The lowest concentration of SARS-CoV-2 viral copies this assay can detect is 138 copies/mL. A negative result does not preclude SARS-Cov-2 infection and should not be used as the sole basis for treatment or other patient management decisions. A negative result may occur with  improper specimen collection/handling, submission of specimen other than nasopharyngeal swab, presence of viral mutation(s) within the areas targeted by this assay, and inadequate number of viral copies(<138  copies/mL). A negative result must be combined with clinical observations, patient history, and epidemiological information. The expected result is Negative.  Fact Sheet for Patients:  EntrepreneurPulse.com.au  Fact Sheet for Healthcare Providers:  IncredibleEmployment.be  This test is no t yet approved or cleared by the [place] FDA and  has been authorized for detection and/or diagnosis of SARS-CoV-2 by FDA under an Emergency Use Authorization (EUA). This EUA will remain  in effect (meaning this test can be used) for the duration of the COVID-19 declaration under Section 564(b)(1) of the Act, 21 U.S.C.section 360bbb-3(b)(1), unless the authorization is terminated  or revoked sooner.       Influenza A by PCR NEGATIVE NEGATIVE Final   Influenza B by PCR NEGATIVE NEGATIVE Final    Comment: (NOTE) The Xpert Xpress SARS-CoV-2/FLU/RSV plus assay is intended as an aid in the diagnosis of influenza from Nasopharyngeal swab specimens and should not be used as a sole basis for treatment. Nasal washings and aspirates are unacceptable for Xpert Xpress SARS-CoV-2/FLU/RSV testing.  Fact Sheet for Patients: EntrepreneurPulse.com.au  Fact Sheet for Healthcare Providers: IncredibleEmployment.be  This test is not yet approved or cleared by the [place] FDA and has been authorized for detection and/or diagnosis of SARS-CoV-2 by FDA under an Emergency Use Authorization (EUA). This EUA will remain in effect (meaning this test can be used) for the duration of the COVID-19 declaration under Section 564(b)(1) of the Act, 21 U.S.C.  section 360bbb-3(b)(1), unless the authorization is terminated or revoked.  Performed at [facility], [place] [address] [place], [place] [address]   Culture, blood (routine x 2)     Status: Abnormal   Collection Time: 06/01/22  3:48 PM   Specimen: BLOOD LEFT FOREARM  Result Value  Ref Range Status   Specimen Description BLOOD LEFT FOREARM  Final   Special Requests   Final    BOTTLES DRAWN AEROBIC AND ANAEROBIC Blood Culture adequate volume   Culture  Setup Time   Final    GRAM POSITIVE COCCI IN CHAINS IN BOTH AEROBIC AND ANAEROBIC BOTTLES CRITICAL RESULT CALLED TO, READ BACK BY AND VERIFIED WITH: [place] ON 06/02/22 @ 1538 BY DRT Performed at [facility], [place] [address], [place], [place] [address]    Culture STREPTOCOCCUS PNEUMONIAE (A)  Final   Report Status 06/04/2022 FINAL  Final   Organism ID, Bacteria STREPTOCOCCUS PNEUMONIAE  Final      Susceptibility   Streptococcus pneumoniae - MIC*    ERYTHROMYCIN <=0.12 SENSITIVE Sensitive     LEVOFLOXACIN 0.5 SENSITIVE Sensitive     VANCOMYCIN <=0.12 SENSITIVE Sensitive     PENICILLIN (meningitis) 0.25 RESISTANT Resistant     PENO - penicillin 0.25      PENICILLIN (non-meningitis) 0.25 SENSITIVE Sensitive     PENICILLIN (oral) 0.25 INTERMEDIATE Intermediate     CEFTRIAXONE (non-meningitis) <=0.12 SENSITIVE Sensitive     CEFTRIAXONE (meningitis) <=0.12 SENSITIVE Sensitive     * STREPTOCOCCUS PNEUMONIAE  Blood Culture ID Panel (Reflexed)     Status: Abnormal   Collection Time: 06/01/22  3:48 PM  Result Value Ref Range Status   Enterococcus faecalis NOT DETECTED NOT DETECTED Final   Enterococcus Faecium NOT DETECTED NOT DETECTED Final   Listeria monocytogenes NOT DETECTED NOT DETECTED Final   Staphylococcus species NOT DETECTED NOT DETECTED Final   Staphylococcus aureus (BCID) NOT DETECTED NOT DETECTED Final   Staphylococcus epidermidis NOT DETECTED NOT DETECTED Final   Staphylococcus lugdunensis NOT DETECTED NOT DETECTED Final   Streptococcus species DETECTED (A) NOT DETECTED Final    Comment: CRITICAL RESULT CALLED TO, READ BACK BY AND VERIFIED WITH: PHARMD ANDREW MEYER ON 06/02/22 @ 1538 BY DRT    Streptococcus agalactiae NOT DETECTED NOT DETECTED Final   Streptococcus pneumoniae DETECTED (A)  NOT DETECTED Final    Comment: CRITICAL RESULT CALLED TO, READ BACK BY AND VERIFIED WITH: PHARMD ANDREW MEYER ON 06/02/22 @ 1538 BY DRT    Streptococcus pyogenes NOT DETECTED NOT DETECTED Final   A.calcoaceticus-baumannii NOT DETECTED NOT DETECTED Final   Bacteroides fragilis NOT DETECTED NOT DETECTED Final   Enterobacterales NOT DETECTED NOT DETECTED Final   Enterobacter cloacae complex NOT DETECTED NOT DETECTED Final   Escherichia coli NOT DETECTED NOT DETECTED Final   Klebsiella aerogenes NOT DETECTED NOT DETECTED Final   Klebsiella oxytoca NOT DETECTED NOT DETECTED Final   Klebsiella pneumoniae NOT DETECTED NOT DETECTED Final   Proteus species NOT DETECTED NOT DETECTED Final   Salmonella species NOT DETECTED NOT DETECTED Final   Serratia marcescens NOT DETECTED NOT DETECTED Final   Haemophilus influenzae NOT DETECTED NOT DETECTED Final   Neisseria meningitidis NOT DETECTED NOT DETECTED Final   Pseudomonas aeruginosa NOT DETECTED NOT DETECTED Final   Stenotrophomonas maltophilia NOT DETECTED NOT DETECTED Final   Candida albicans NOT DETECTED NOT DETECTED Final   Candida auris NOT DETECTED NOT DETECTED Final   Candida glabrata NOT DETECTED NOT DETECTED Final  Candida krusei NOT DETECTED NOT DETECTED Final   Candida parapsilosis NOT DETECTED NOT DETECTED Final   Candida tropicalis NOT DETECTED NOT DETECTED Final   Cryptococcus neoformans/gattii NOT DETECTED NOT DETECTED Final    Comment: Performed at [facility], [place] [address] [place], [place] [address]  Culture, blood (routine x 2)     Status: Abnormal   Collection Time: 06/01/22  4:00 PM   Specimen: BLOOD  Result Value Ref Range Status   Specimen Description BLOOD LEFT ANTECUBITAL  Final   Special Requests   Final    BOTTLES DRAWN AEROBIC AND ANAEROBIC Blood Culture results may not be optimal due to an inadequate volume of blood received in culture bottles   Culture  Setup Time   Final    GRAM POSITIVE COCCI IN  CHAINS IN BOTH AEROBIC AND ANAEROBIC BOTTLES    Culture (A)  Final    STREPTOCOCCUS PNEUMONIAE SUSCEPTIBILITIES PERFORMED ON PREVIOUS CULTURE WITHIN THE LAST 5 DAYS. Performed at [facility], [place] [address] [place], [place] [address]    Report Status 06/04/2022 FINAL  Final  Culture, blood (Routine X 2) w Reflex to ID Panel     Status: None (Preliminary result)   Collection Time: 06/03/22 12:47 PM   Specimen: BLOOD RIGHT ARM  Result Value Ref Range Status   Specimen Description BLOOD RIGHT ARM  Final   Special Requests   Final    BOTTLES DRAWN AEROBIC ONLY Blood Culture adequate volume   Culture   Final    NO GROWTH 2 DAYS Performed at [facility], [place] [address], [place], [place] [address]    Report Status PENDING  Incomplete    Impression/Plan:  1. Pneumonia - cultures with Strep pneumoniae.  He is feeling better with no complaints today.  His WBC is declining.  He is afebrile and not on supplemental oxygen.  Repeat blood cultures ngtd.  Will continue with ceftriaxone and he can be discharged with oral amoxicillin 1000 mg three times a day for 14 days total treatment from negative blood culture (due to presence of pacemaker) through 06/16/22.    2.  Penicillin allergy - remote history of hives.  Will challenge him today   3.  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  Treatment as above.    I will otherwise sign off, call with questions

## 2022-06-05 NOTE — Interval H&P Note (Signed)
History and Physical Interval Note:  06/05/2022 7:51 AM  Alan [name]  has presented today for surgery, with the diagnosis of BACTEREMIA.  The various methods of treatment have been discussed with the patient and family. After consideration of risks, benefits and other options for treatment, the patient has consented to  Procedure(s): TRANSESOPHAGEAL ECHOCARDIOGRAM (TEE) (N/A) as a surgical intervention.  The patient's history has been reviewed, patient examined, no change in status, stable for surgery.  I have reviewed the patient's chart and labs.  Questions were answered to the patient's satisfaction.     Skeet Latch, MD

## 2022-06-05 NOTE — Anesthesia Postprocedure Evaluation (Signed)
Anesthesia Post Note  Patient: Alan [name]  Procedure(s) Performed: TRANSESOPHAGEAL ECHOCARDIOGRAM (TEE)     Patient location during evaluation: Endoscopy Anesthesia Type: MAC Level of consciousness: awake and alert Pain management: pain level controlled Vital Signs Assessment: post-procedure vital signs reviewed and stable Respiratory status: spontaneous breathing, nonlabored ventilation, respiratory function stable and patient connected to nasal cannula oxygen Cardiovascular status: stable and blood pressure returned to baseline Postop Assessment: no apparent nausea or vomiting Anesthetic complications: no   No notable events documented.  Last Vitals:  Vitals:   06/05/22 1318 06/05/22 1350  BP: (!) 179/94 (!) 158/102  Pulse: 70 68  Resp: 18 18  Temp:    SpO2: 99% 98%    Last Pain:  Vitals:   06/05/22 0840  TempSrc:   PainSc: 0-No pain                 Santa Lighter

## 2022-06-05 NOTE — Plan of Care (Signed)
Problem: Education: Goal: Knowledge of risk factors and measures for prevention of condition will improve 06/05/2022 1522 by Sabino Niemann, RN Outcome: Completed/Met 06/05/2022 1522 by Sabino Niemann, RN Outcome: Adequate for Discharge   Problem: Coping: Goal: Psychosocial and spiritual needs will be supported 06/05/2022 1522 by Sabino Niemann, RN Outcome: Completed/Met 06/05/2022 1522 by Sabino Niemann, RN Outcome: Adequate for Discharge   Problem: Respiratory: Goal: Will maintain a patent airway 06/05/2022 1522 by Sabino Niemann, RN Outcome: Completed/Met 06/05/2022 1522 by Sabino Niemann, RN Outcome: Adequate for Discharge Goal: Complications related to the disease process, condition or treatment will be avoided or minimized 06/05/2022 1522 by Sabino Niemann, RN Outcome: Completed/Met 06/05/2022 1522 by Sabino Niemann, RN Outcome: Adequate for Discharge   Problem: Activity: Goal: Ability to tolerate increased activity will improve 06/05/2022 1522 by Sabino Niemann, RN Outcome: Completed/Met 06/05/2022 1522 by Sabino Niemann, RN Outcome: Adequate for Discharge   Problem: Clinical Measurements: Goal: Ability to maintain a body temperature in the normal range will improve 06/05/2022 1522 by Sabino Niemann, RN Outcome: Completed/Met 06/05/2022 1522 by Sabino Niemann, RN Outcome: Adequate for Discharge   Problem: Respiratory: Goal: Ability to maintain adequate ventilation will improve 06/05/2022 1522 by Sabino Niemann, RN Outcome: Completed/Met 06/05/2022 1522 by Sabino Niemann, RN Outcome: Adequate for Discharge Goal: Ability to maintain a clear airway will improve 06/05/2022 1522 by Sabino Niemann, RN Outcome: Completed/Met 06/05/2022 1522 by Sabino Niemann, RN Outcome: Adequate for Discharge   Problem: Education: Goal: Knowledge of General Education information will improve Description: Including pain rating scale, medication(s)/side effects and  non-pharmacologic comfort measures 06/05/2022 1522 by Sabino Niemann, RN Outcome: Completed/Met 06/05/2022 1522 by Sabino Niemann, RN Outcome: Adequate for Discharge   Problem: Health Behavior/Discharge Planning: Goal: Ability to manage health-related needs will improve 06/05/2022 1522 by Sabino Niemann, RN Outcome: Completed/Met 06/05/2022 1522 by Sabino Niemann, RN Outcome: Adequate for Discharge   Problem: Clinical Measurements: Goal: Ability to maintain clinical measurements within normal limits will improve 06/05/2022 1522 by Sabino Niemann, RN Outcome: Completed/Met 06/05/2022 1522 by Sabino Niemann, RN Outcome: Adequate for Discharge Goal: Will remain free from infection 06/05/2022 1522 by Sabino Niemann, RN Outcome: Completed/Met 06/05/2022 1522 by Sabino Niemann, RN Outcome: Adequate for Discharge Goal: Diagnostic test results will improve 06/05/2022 1522 by Sabino Niemann, RN Outcome: Completed/Met 06/05/2022 1522 by Sabino Niemann, RN Outcome: Adequate for Discharge Goal: Respiratory complications will improve 06/05/2022 1522 by Sabino Niemann, RN Outcome: Completed/Met 06/05/2022 1522 by Sabino Niemann, RN Outcome: Adequate for Discharge Goal: Cardiovascular complication will be avoided 06/05/2022 1522 by Sabino Niemann, RN Outcome: Completed/Met 06/05/2022 1522 by Sabino Niemann, RN Outcome: Adequate for Discharge   Problem: Activity: Goal: Risk for activity intolerance will decrease 06/05/2022 1522 by Sabino Niemann, RN Outcome: Completed/Met 06/05/2022 1522 by Sabino Niemann, RN Outcome: Adequate for Discharge   Problem: Nutrition: Goal: Adequate nutrition will be maintained 06/05/2022 1522 by Sabino Niemann, RN Outcome: Completed/Met 06/05/2022 1522 by Sabino Niemann, RN Outcome: Adequate for Discharge   Problem: Coping: Goal: Level of anxiety will decrease 06/05/2022 1522 by Sabino Niemann, RN Outcome: Completed/Met 06/05/2022 1522 by Sabino Niemann, RN Outcome: Adequate for Discharge   Problem: Elimination: Goal: Will not experience complications related to bowel motility 06/05/2022 1522 by Sabino Niemann, RN Outcome: Completed/Met 06/05/2022 1522 by Sabino Niemann, RN Outcome: Adequate for Discharge Goal: Will not experience complications related to urinary retention 06/05/2022 1522 by Sabino Niemann, RN Outcome: Completed/Met 06/05/2022 1522 by Sabino Niemann, RN Outcome: Adequate for Discharge   Problem: Pain Managment: Goal:  General experience of comfort will improve 06/05/2022 1522 by Sabino Niemann, RN Outcome: Completed/Met 06/05/2022 1522 by Sabino Niemann, RN Outcome: Adequate for Discharge   Problem: Safety: Goal: Ability to remain free from injury will improve 06/05/2022 1522 by Sabino Niemann, RN Outcome: Completed/Met 06/05/2022 1522 by Sabino Niemann, RN Outcome: Adequate for Discharge   Problem: Skin Integrity: Goal: Risk for impaired skin integrity will decrease 06/05/2022 1522 by Sabino Niemann, RN Outcome: Completed/Met 06/05/2022 1522 by Sabino Niemann, RN Outcome: Adequate for Discharge

## 2022-06-05 NOTE — Transfer of Care (Signed)
Immediate Anesthesia Transfer of Care Note  Patient: Alan [name]  Procedure(s) Performed: TRANSESOPHAGEAL ECHOCARDIOGRAM (TEE)  Patient Location: Short Stay  Anesthesia Type:MAC  Level of Consciousness: drowsy  Airway & Oxygen Therapy: Patient Spontanous Breathing and Patient connected to nasal cannula oxygen  Post-op Assessment: Report given to RN and Post -op Vital signs reviewed and stable  Post vital signs: Reviewed and stable  Last Vitals:  Vitals Value Taken Time  BP 134/80   Temp    Pulse 73 06/05/22 0820  Resp 18 06/05/22 0820  SpO2 100 % 06/05/22 0820  Vitals shown include unvalidated device data.  Last Pain:  Vitals:   06/05/22 0658  TempSrc: Temporal  PainSc: 0-No pain         Complications: No notable events documented.

## 2022-06-05 NOTE — Progress Notes (Incomplete)
HEMATOLOGY/ONCOLOGY CLINIC NOTE  Date of Service: 05/29/22    Patient Care Team: [name], MD as PCP - General (Internal Medicine) Sueanne Margarita, MD as PCP - Cardiology (Cardiology) Constance Haw, MD as PCP - Electrophysiology (Cardiology) Brunetta Genera, MD as Consulting Physician (Hematology) Pickenpack-Cousar, Carlena Sax, NP as Nurse Practitioner (Nurse Practitioner) Cameron Sprang, MD as Consulting Physician (Neurology) Lisabeth Pick, MD as Consulting Physician (Ophthalmology)  CHIEF COMPLAINTS/PURPOSE OF CONSULTATION:  For continued evaluation and management of metastatic intestinal neuroendocrine tumor  HISTORY OF PRESENTING ILLNESS:  Please see previous note for details on initial presentation  Alan [name] is here for follow-up of his metastatic small intestinal carcinoid tumor.    Patient was last seen by me on 03/23/22. He continued to have diarrhea x4-5 times a day at that time but had no other toxicities from his lanreotide. He continues to use as needed loperamide as well as Lomotil to control his diarrhea. He had difficulties maintaining his p.o. intake and was also in the hospital for syncope and collapse on 2 occasions in early August requiring discharge to SNF. He was being managed by cardiology for his orthostatic hypotension. Given his limiting renal function and recurrent hospitalizations he has not been a candidate for Lutathera.  Today, he states that he has felt fatigued and generally weak. However, his blood pressures have been fluctuating the last few days. His wife states that she reduced his dose of Northera '100mg'$  TID to BID use as his BP has been higher. However, he notes accompanying lightheadedness and near syncope but no true syncope.  He reports diarrhea but this has been well controlled with Loperamide as well as Lomotil. The diarrhea has improved to 3-4 times a day and typically resolves by the second half of  the month. His wife has concerns that his monthly Lanreotide is causing his diarrhea to be worse at the beginning of the month. Patient is unsure that this is the case. He would like to continue the treatments at this time.  He drinks 48oz or so of water per day. His creatinine if 3.40 today.  He reports some tremors and reduced dexterity in his bilateral hands. He uses a walker or a wheelchair but can go some days without it depending on his energy level.  He took a trip to [place] recently and went into A-fib shortly after his trip.   MEDICAL HISTORY:  Past Medical History:  Diagnosis Date  . Asthma   . Carcinoid tumor, small intestine, malignant ([place])   . Chronic diarrhea   . CKD (chronic kidney disease) stage 3, GFR 30-59 ml/min (H[facility])   . HTN (hypertension)   . Short gut syndrome     SURGICAL HISTORY: Past Surgical History:  Procedure Laterality Date  . COLON SURGERY    . 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   Procedure: PACEMAKER IMPLANT;  Surgeon: Constance Haw, MD;  Location: [facility];  Service: Cardiovascular;  Laterality: N/A;    SOCIAL HISTORY: Social History   Socioeconomic History  . Marital status: Married    Spouse name: Not on file  . Number of children: Not on file  . Years of education: Not on file  . Highest education level: Not on file  Occupational History  . Not on file  Tobacco Use  . Smoking status: Some Days    Packs/day: 0.30    Years: 61.00    Total pack years: 18.30  Types: Cigars, Cigarettes    Start date: 53    Last attempt to quit: 03/03/2020    Years since quitting: 2.2  . Smokeless tobacco: Never  . Tobacco comments:    Former Mining engineer  . Vaping Use: Never used  Substance and Sexual Activity  . Alcohol use: Not Currently  . Drug use: No  . Sexual activity: Not Currently  Other Topics Concern  . Not on file  Social History Narrative   Right handed    Lives with wife    Social Determinants of Health    Financial Resource Strain: Low Risk  (02/26/2022)   Overall Financial Resource Strain (CARDIA)   . Difficulty of Paying Living Expenses: Not hard at all  Food Insecurity: No Food Insecurity (02/26/2022)   Hunger Vital Sign   . Worried About Charity fundraiser in the Last Year: Never true   . Ran Out of Food in the Last Year: Never true  Transportation Needs: No Transportation Needs (02/26/2022)   PRAPARE - Transportation   . Lack of Transportation (Medical): No   . Lack of Transportation (Non-Medical): No  Physical Activity: Inactive (02/26/2022)   Exercise Vital Sign   . Days of Exercise per Week: 0 days   . Minutes of Exercise per Session: 0 min  Stress: Stress Concern Present (02/26/2022)   [place]   . Feeling of Stress : Very much  Social Connections: Socially Integrated (02/26/2022)   Social Connection and Isolation Panel [NHANES]   . Frequency of Communication with Friends and Family: More than three times a week   . Frequency of Social Gatherings with Friends and Family: More than three times a week   . Attends Religious Services: More than 4 times per year   . Active Member of Clubs or Organizations: Yes   . Attends Archivist Meetings: More than 4 times per year   . Marital Status: Married  Human resources officer Violence: Not At Risk (02/26/2022)   Humiliation, Afraid, Rape, and Kick questionnaire   . Fear of Current or Ex-Partner: No   . Emotionally Abused: No   . Physically Abused: No   . Sexually Abused: No    FAMILY HISTORY: Family History  Problem Relation Age of Onset  . Alzheimer's disease Mother     ALLERGIES:  is allergic to penicillins and lisinopril.  MEDICATIONS:  Current Outpatient Medications  Medication Sig Dispense Refill  . albuterol (VENTOLIN HFA) 108 (90 Base) MCG/ACT inhaler Inhale 2 puffs into the lungs every 6 (six) hours as needed for wheezing or shortness of breath. 18  g 5  . aspirin 81 MG EC tablet Take 81 mg by mouth daily. Swallow whole.    . diphenoxylate-atropine (LOMOTIL) 2.5-0.025 MG tablet Take 1 tablet by mouth 2 (two) times daily as needed for diarrhea or loose stools. 30 tablet 0  . dronabinol (MARINOL) 5 MG capsule Take 1 capsule (5 mg total) by mouth 2 (two) times daily before a meal. 60 capsule 0  . droxidopa (NORTHERA) 100 MG CAPS TAKE 1 CAPSULE BY MOUTH THREE TIMES DAILY WITH MEALS 90 capsule 0  . feeding supplement, ENSURE COMPLETE, (ENSURE COMPLETE) LIQD Take 237 mLs by mouth 2 (two) times daily between meals.    . finasteride (PROSCAR) 5 MG tablet Take 5 mg by mouth daily.    . fludrocortisone (FLORINEF) 0.1 MG tablet Take 1 tablet (0.1 mg total) by mouth daily. [place]  tablet 2  . iron polysaccharides (NIFEREX) 150 MG capsule Take 1 capsule (150 mg total) by mouth as directed. TAKE 1 CAPSULE(150 MG) BY MOUTH 3 TIMES A WEEK Strength: 150 mg 30 capsule 2  . LANREOTIDE ACETATE [place] Inject 1 Dose into the skin every 30 (thirty) days.    Marland Kitchen loperamide (IMODIUM A-D) 2 MG capsule Take 2 mg by mouth daily as needed for diarrhea or loose stools.    . mirtazapine (REMERON) 45 MG tablet Take 1 tablet (45 mg total) by mouth daily. 90 tablet 3  . Multiple Vitamins-Minerals (PRESERVISION AREDS 2 PO) Take 1 capsule by mouth daily.    . multivitamin (ONE-A-DAY MEN'S) TABS tablet Take 1 tablet by mouth daily.    . Potassium Chloride ER 20 MEQ TBCR Take 1 tablet by mouth 2 (two) times daily. 60 tablet 5  . SULFAMETHOXAZOLE-TRIMETHOPRIM PO Take 1 tablet by mouth 2 (two) times daily.    . SYMBICORT 160-4.5 MCG/ACT inhaler Inhale 2 puffs into the lungs daily as needed (wheezing). 3 each 1  . thiamine (VITAMIN B-1) 50 MG tablet Take 1 tablet (50 mg total) by mouth daily. 90 tablet 1   No current facility-administered medications for this visit.    REVIEW OF SYSTEMS:   10 Point review of Systems was done is negative except as noted above.  PHYSICAL EXAMINATION: ECOG  PERFORMANCE STATUS: 3  Vitals:   05/29/22 1258  BP: 120/76  Pulse: 89  Resp: 17  Temp: 97.7 F (36.5 C)  SpO2: 100%    Filed Weights   05/29/22 1258  Weight: 133 lb 1.6 oz (60.4 kg)    GENERAL:alert, in no acute distress and comfortable SKIN: no acute rashes, no significant lesions EYES: conjunctiva are pink and non-injected, sclera anicteric OROPHARYNX: MMM, no exudates, no oropharyngeal erythema or ulceration NECK: supple, no JVD LYMPH:  no palpable lymphadenopathy in the cervical, axillary or inguinal regions LUNGS: clear to auscultation b/l with normal respiratory effort HEART: regular rate & rhythm ABDOMEN:  normoactive bowel sounds , non tender, not distended. Extremity: no pedal edema PSYCH: alert & oriented x 3 with fluent speech NEURO: no focal motor/sensory deficits   LABORATORY DATA:  I have reviewed the data as listed     Latest Ref Rng & Units 05/18/2022    2:54 PM 04/20/2022    3:02 PM 03/23/2022    1:33 PM  CBC  WBC 4.0 - 10.5 K/uL 5.9  6.9  6.1   Hemoglobin 13.0 - 17.0 g/dL 9.9  10.1  10.3   Hematocrit 39.0 - 52.0 % 28.0  28.7  29.7   Platelets 150 - 400 K/uL 165  193  171        Latest Ref Rng & Units 05/18/2022    2:54 PM 04/20/2022    3:02 PM 03/23/2022    1:33 PM  CMP  Glucose 70 - 99 mg/dL 127  118  108   BUN 8 - 23 mg/dL 44  46  56   Creatinine 0.61 - 1.24 mg/dL 3.40  2.76  2.90   Sodium 135 - 145 mmol/L 143  139  139   Potassium 3.5 - 5.1 mmol/L 4.3  4.5  4.5   Chloride 98 - 111 mmol/L 113  109  112   CO2 22 - 32 mmol/L '23  24  19   '$ Calcium 8.9 - 10.3 mg/dL 8.9  8.8  8.9   Total Protein 6.5 - 8.1 g/dL 7.0  6.6  7.3  Total Bilirubin 0.3 - 1.2 mg/dL 0.5  0.5  0.4   Alkaline Phos 38 - 126 U/L 58  52  53   AST 15 - 41 U/L 40  29  14   ALT 0 - 44 U/L 63  44  21    Iron/TIBC/Ferritin/ %Sat    Component Value Date/Time   IRON 88 05/18/2022 1454   TIBC 217 (L) 05/18/2022 1454   FERRITIN 106 05/18/2022 1454   IRONPCTSAT 41 (H)  05/18/2022 1454      RADIOGRAPHIC STUDIES:  I have personally reviewed the radiological images as listed and agreed with the findings in the report.  ASSESSMENT & PLAN:   81 y.o. male with  #1 Metastatic small bowel malignant carcinoid with chronic significant diarrhea and weight loss. Patient is currently on lanreotide every 4 weeks  #2 s/p  severe hypokalemia -now resolved after aggressive potassium replacement. Likely related to his decreased p.o. intake, chronic diarrhea and the use of Florinef.  #3 Iron deficiency anemia #4 acute on chronic renal insufficiency. #5 cancer and cancer treatment related anorexia with weight loss. #6 depression and anxiety   PLAN: -Labs done today were discussed in detail with the patient -Reviewed elevated creatinine and encouraged increased hydration -No notable toxicities from lanreotide -We shall continue lanreotide every 4 weeks at this time and can increase the frequency to every 3 weeks if needed. -Continue loperamide and as needed Lomotil to control diarrhea -Continue follow-up with cardiology for management of orthostasis -Patient not a candidate for Lutathera at this time due to concerns for worsening kidney function and poor functional status.  Follow-up: -Continue lanreotide every 4 weeks x6  -Return to clinic with Dr. Irene Limbo with labs in 2 weeks   The total time spent in the appointment was *** minutes* .  All of the patient's questions were answered with apparent satisfaction. The patient knows to call the clinic with any problems, questions or concerns.   I,Alexis Herring,acting as a Education administrator for Sullivan Lone, MD.,have documented all relevant documentation on the behalf of Sullivan Lone, MD,as directed by  Sullivan Lone, MD while in the presence of Sullivan Lone, MD.  ***  Sullivan Lone MD [place] AAHIVMS [facility] Hematology/Oncology Physician [facility]  .*Total Encounter Time as defined by the Centers for Medicare and  Medicaid Services includes, in addition to the face-to-face time of a patient visit (documented in the note above) non-face-to-face time: obtaining and reviewing outside history, ordering and reviewing medications, tests or procedures, care coordination (communications with other health care professionals or caregivers) and documentation in the medical record.

## 2022-06-05 NOTE — Progress Notes (Signed)
Penicillin Allergy Clarification: Oral Amoxicillin Challenge  History of allergy:  Low Risk  >20 years ago, SOB/rash that did not require immediate intervention   Type of intervention: Amoxicillin Oral Challenge - 500 mg po x 1 dose given on 12/8 afternoon. Pt agreeable and RN counseled on monitoring. Tolerated without issues   Impact on therapy:  Penicillin allergy removed  Thank you for allowing pharmacy to be a part of this patient's care.  Alycia Rossetti, PharmD, BCPS Infectious Diseases Clinical Pharmacist 06/05/2022 3:00 PM   **Pharmacist phone directory can now be found on [email or web address] ([email or web address]).  Listed under [place].

## 2022-06-05 NOTE — Discharge Summary (Addendum)
Physician Discharge Summary  Alan [name] [facility]:[id number] DOB: December 16, 1940 DOA: 06/01/2022  PCP: [name], MD  Admit date: 06/01/2022 Discharge date: 06/05/2022  Admitted From: Home Disposition: Home  Recommendations for Outpatient Follow-up:  Follow up with PCP in 1-2 weeks Outpatient follow-up with GI, Dr. Michail Sermon as scheduled Outpatient follow-up with medical oncology, Dr. Irene Limbo outpatient. Continue antibiotics on discharge with amoxicillin 1 g p.o. twice daily to complete total course of 14 days from negative culture date of 06/03/2022 Please obtain BMP/CBC in one week to assess renal function, hemoglobin level and WBC count Given patient's significant severe protein calorie malnutrition in the setting of his underlying malignancy, recommend further goals of care discussions as well as nutritional discussions.  If he remains to wish for aggressive measures, would highly consider PEG tube placement with tube feed initiation to assist with his nutritional status. Follow-up repeat blood cultures performed on 06/03/2022 that were still pending finalization at time of discharge, they show no growth x 2 days on discharge  Home Health: PT Equipment/Devices: None  Discharge Condition: Stable CODE STATUS: Full code Diet recommendation: Regular diet  History of present illness:  Alan [name] is a 81 y.o. male with past medical history significant for essential hypertension, atrial fibrillation not on anticoagulation due to history of GIB, malignant carcinoid of the intestines, CKD stage IV, chronic diarrhea who presented to [name] ED with 3-4-day history of progressively worsening weakness, cough.  Evaluation in the ED notable for leukocytosis, CT chest with right upper lobe pneumonia.  Hospitalist service was consulted for admission for further evaluation management of sepsis secondary to community acquired pneumonia.   [name] course:  Sepsis, POA Streptococcus pneumonia  bacteremia Community-acquired pneumonia Patient presenting to ED with 4-day history of progressive weakness, cough was found to be tachypneic, with leukocytosis.  CT chest with right upper lobe consolidation.  Blood cultures x 4 positive for Streptococcus pneumonia. Complicated by history of PPM in place. TTE with LVEF 35-40%, LV with global hypokinesis, grade 1 diastolic dysfunction, LA moderately dilated, IVC dilated, moderate calcification aortic valve.  Underwent TEE that was negative for endocarditis.  Infectious disease was consulted and followed during [name] course.  Patient was treated with ceftriaxone while inpatient initially.  WBC count decreased from a peak of 24.6 to 14.6 at time of discharge.  Patient tolerated amoxicillin challenge test during hospitalization and ID recommended amoxicillin 1000 mg p.o. BID to complete a total course of 14 days from negative culture date of 12/6.  Recommend repeat CBC 1 week.   Anemia of chronic medical/renal disease, acute on chronic Hx GI Bleed Patient was seen by gastroenterology, Dr. Michail Sermon on 12/5.  Recent EGD 2022 showing gastritis and colonoscopy 06/2020 with 2 sessile serrated adenomas that were removed.  Patient denies any overt bleeding.  GI recommends holding off on endoscopic procedures with planned repeat surveillance colonoscopy in January 2024.  Anemia panel with iron 11, TIBC 119, ferritin 399, folate 19.9, vitamin B12 554.  Transfused 2 unit PRBCs on 12/6.  Hemoglobin remained stable, 11.3 at time of discharge.  Recommend repeat CBC 1 week.  Outpatient follow-up with GI, Dr. Michail Sermon.   Acute renal failure on CKD stage IV Baseline creatinine 2.7-3.0.  Patient likely dehydrated secondary to poor oral intake as well as diarrhea versus ATN from septicemia.  Received IV fluids during hospitalization.  Creatinine peaked at 4.74 during hospitalization and improved to 4.46 at time of discharge.  Recommend repeat BMP 1 week.   Combined  systolic/diastolic  CHF, compensated TTE with LVEF 35-40%, LV with global hypokinesis, grade 1 diastolic dysfunction.  Previous TTE 01/17/2021 with LVEF 55 to 60%, no LV regional wall motion abnormalities.  Follows with cardiology outpatient.  Denies chest pain.  Will need close follow-up with cardiology on discharge for consideration of ischemic workup outpatient once recovers from septicemia.   Hx paroxysmal atrial fibrillation not on anticoagulation Follows with cardiology outpatient.  Not on anticoagulation due to fall risk, history of GI bleeds.   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 Irene Limbo and palliative care outpatient.  Currently on Lanreotide every 4 weeks.  Given patient's advanced age, overall nutritional status recommend further goals of care discussion outpatient with his primary physicians, specifically oncology given his likely overall very poor prognosis.  See recommendations as above regarding nutritional status and consideration of PEG placement if want to continue aggressive measures for additional nutritional needs with tube feeds.   Hx AV block, second-degree s/p PPM Follows with electrophysiology, Dr. Lovena Le outpatient.  Has pacemaker in place.   Hx neurogenic orthostatic hypotension Continue droxidopa 100 mg TIDAC, Fludrocortisone 0.1 mg p.o. daily   Asthma On Symbicort and albuterol as needed outpatient.   Severe protein calorie malnutrition Body mass index is 17.45 kg/m.  Nutrition Status: Nutrition Problem: Severe Malnutrition Etiology: chronic illness Signs/Symptoms: severe fat depletion, severe muscle depletion, energy intake < or equal to 75% for > or equal to 1 month Interventions: Ensure Enlive (each supplement provides 350kcal and 20 grams of protein), Magic cup, MVI -- Dietitian consulted, given his significant weight loss did  recommend feeding tube, discussed with patient who states has discussed with his oncologist and recommend further discussion outpatient.   Weakness/debility/deconditioning: Discharge with home health PT.  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)   Paroxysmal atrial fibrillation (HCC)   Underweight   Iron deficiency anemia due to chronic blood loss   AV block, 2nd degree   CKD (chronic kidney disease), stage IV (HCC)   Protein-calorie malnutrition, severe    Discharge Instructions  Discharge Instructions     Call MD for:  difficulty breathing, headache or visual disturbances   Complete by: As directed    Call MD for:  extreme fatigue   Complete by: As directed    Call MD for:  persistant dizziness or light-headedness   Complete by: As directed    Call MD for:  persistant nausea and vomiting   Complete by: As directed    Call MD for:  severe uncontrolled pain   Complete by: As directed    Call MD for:  temperature >100.4   Complete by: As directed    Diet - low sodium heart healthy   Complete by: As directed    Increase activity slowly   Complete by: As directed       Allergies as of 06/05/2022       Reactions   Lisinopril Itching        Medication List     STOP taking these medications    sulfamethoxazole-trimethoprim 800-160 MG tablet Commonly known as: BACTRIM DS  TAKE these medications    acetaminophen 500 MG tablet Commonly known as: TYLENOL Take 1,000 mg by mouth every 8 (eight) hours as needed for moderate pain or mild pain.   albuterol 108 (90 Base) MCG/ACT inhaler Commonly known as: VENTOLIN HFA Inhale 2 puffs into the lungs every 6  (six) hours as needed for wheezing or shortness of breath.   amoxicillin 500 MG tablet Commonly known as: AMOXIL Take 2 tablets (1,000 mg total) by mouth in the morning, at noon, and at bedtime for 12 days.   aspirin EC 81 MG tablet Take 81 mg by mouth daily. Swallow whole.   diphenoxylate-atropine 2.5-0.025 MG tablet Commonly known as: LOMOTIL Take 1 tablet by mouth 2 (two) times daily as needed for diarrhea or loose stools.   dronabinol 5 MG capsule Commonly known as: MARINOL Take 1 capsule (5 mg total) by mouth 2 (two) times daily before a meal.   droxidopa 100 MG Caps Commonly known as: NORTHERA TAKE 1 CAPSULE BY MOUTH THREE TIMES DAILY WITH MEALS What changed: See the new instructions.   feeding supplement (ENSURE COMPLETE) Liqd Take 237 mLs by mouth 2 (two) times daily between meals.   finasteride 5 MG tablet Commonly known as: PROSCAR Take 5 mg by mouth daily.   fludrocortisone 0.1 MG tablet Commonly known as: FLORINEF Take 1 tablet (0.1 mg total) by mouth daily.   Imodium A-D 2 MG capsule Generic drug: loperamide Take 2 mg by mouth daily as needed for diarrhea or loose stools.   iron polysaccharides 150 MG capsule Commonly known as: NIFEREX Take 1 capsule (150 mg total) by mouth as directed. TAKE 1 CAPSULE(150 MG) BY MOUTH 3 TIMES A WEEK Strength: 150 mg   LANREOTIDE ACETATE [place] Inject 1 Dose into the skin every 30 (thirty) days.   mirtazapine 45 MG tablet Commonly known as: REMERON Take 1 tablet (45 mg total) by mouth daily.   naproxen sodium 220 MG tablet Commonly known as: ALEVE Take 220 mg by mouth daily as needed (pain).   Potassium Chloride ER 20 MEQ Tbcr Take 1 tablet by mouth 2 (two) times daily.   PRESERVISION AREDS 2 PO Take 1 capsule by mouth daily. With Multi-Vitamin all-in-one Tablet   Symbicort 160-4.5 MCG/ACT inhaler Generic drug: budesonide-formoterol Inhale 2 puffs into the lungs daily as needed (wheezing).   thiamine 50 MG  tablet Commonly known as: VITAMIN B-1 Take 1 tablet (50 mg total) by mouth daily.        Follow-up Information     Care, [name] Follow up.   Specialty: Home Health Services Why: [name] will be providing you with home health PT.  They will call your wife in the next 24-48 hours to set up therapy times. Contact information: [place] [address] [phone]         [name], MD. Schedule an appointment as soon as possible for a visit.   Specialty: Internal Medicine Why: Please call your primary care and schedule a [name] follow up in the next 7-10 days. Contact information: [place] [address] [phone]                Allergies  Allergen Reactions   Lisinopril Itching    Consultations: Infectious disease, Dr. Linus Salmons   Procedures/Studies: ECHO TEE  Result Date: 06/05/2022    TRANSESOPHOGEAL ECHO REPORT   Patient Name:   Alan [name] Date of Exam: 06/05/2022 Medical Rec #:  [id number]       Height:       73.0 in Accession #:    [id number]      Weight:       132.3 lb Date of Birth:  1940/09/24        BSA:          1.805 m Patient Age:    81 years        BP:           166/92 mmHg Patient Gender: M               HR:           80 bpm. Exam Location:  Inpatient Procedure: 2D Echo, Cardiac Doppler and Color Doppler Indications:     Bacteremia  History:         Patient has prior history of Echocardiogram examinations, most                  recent 06/03/2022. Pacemaker, Arrythmias:Atrial Fibrillation,                  Signs/Symptoms:Shortness of Breath; Risk Factors:Current                  Smoker. AV block.  Sonographer:     Clayton Lefort RDCS (AE) Referring Phys:  [id number] Tami Lin [facility] Diagnosing Phys: Skeet Latch MD PROCEDURE: After discussion of the risks and benefits of a TEE, an informed consent was obtained from the patient. The transesophogeal probe was passed without difficulty through the  esophogus of the patient. Sedation performed by different physician. The patient was monitored while under deep sedation. Anesthestetic sedation was provided intravenously by Anesthesiology: '120mg'$  of Propofol, '100mg'$  of Lidocaine. Image quality was good. The patient's vital signs; including heart rate, blood pressure, and oxygen saturation; remained stable throughout the procedure. The patient developed no complications during the procedure.  IMPRESSIONS  1. Mild hypokinesis of the anterior and anteroseptal myocardium. Systolic function improved from 06/03/22. Left ventricular ejection fraction, by estimation, is 45 to 50%. The left ventricle has mildly decreased function. The left ventricle demonstrates regional wall motion abnormalities (see scoring diagram/findings for description).  2. Right ventricular systolic function is normal. The right ventricular size is normal.  3. No left atrial/left atrial appendage thrombus was detected.  4. The mitral valve is normal in structure. Trivial mitral valve regurgitation. No evidence of mitral stenosis.  5. The aortic valve is tricuspid. Aortic valve regurgitation is not visualized. No aortic stenosis is present.  6. The inferior vena cava is normal in size with greater than 50% respiratory variability, suggesting right atrial pressure of 3 mmHg. Conclusion(s)/Recommendation(s): Normal biventricular function without evidence of hemodynamically significant valvular heart disease. No evidence of vegetation/infective endocarditis on this transesophageael echocardiogram. FINDINGS  Left Ventricle: Mild hypokinesis of the anterior and anteroseptal myocardium. Systolic function improved from 06/03/22. Left ventricular ejection fraction, by estimation, is 45 to 50%. The left ventricle has mildly decreased function. The left ventricle demonstrates regional wall motion abnormalities. The left ventricular internal cavity size was normal in size. There is no left ventricular hypertrophy.  Right Ventricle: The right ventricular size is normal. No increase in right ventricular wall thickness. Right ventricular systolic function is normal. Left Atrium: Left atrial size was normal in size. No left atrial/left atrial appendage thrombus was detected. Right Atrium: Right atrial size was normal in size. Pericardium: There is no evidence of pericardial effusion. Mitral Valve: The mitral valve is normal in  structure. Trivial mitral valve regurgitation. No evidence of mitral valve stenosis. Tricuspid Valve: The tricuspid valve is normal in structure. Tricuspid valve regurgitation is mild . No evidence of tricuspid stenosis. Aortic Valve: The aortic valve is tricuspid. Aortic valve regurgitation is not visualized. No aortic stenosis is present. Pulmonic Valve: The pulmonic valve was normal in structure. Pulmonic valve regurgitation is trivial. No evidence of pulmonic stenosis. Aorta: The aortic root is normal in size and structure. There is minimal (Grade I) layered plaque involving the descending aorta. Venous: The inferior vena cava is normal in size with greater than 50% respiratory variability, suggesting right atrial pressure of 3 mmHg. IAS/Shunts: No atrial level shunt detected by color flow Doppler. Additional Comments: A device lead is visualized.  AORTA Ao Asc diam: 3.30 cm TRICUSPID VALVE TR Peak grad:   13.7 mmHg TR Vmax:        185.00 cm/s Skeet Latch MD Electronically signed by Skeet Latch MD Signature Date/Time: 06/05/2022/11:52:17 AM    Final    ECHOCARDIOGRAM COMPLETE  Result Date: 06/03/2022    ECHOCARDIOGRAM REPORT   Patient Name:   Alan [name] Date of Exam: 06/03/2022 Medical Rec #:  [id number]       Height:       73.0 in Accession #:    [id number]      Weight:       132.3 lb Date of Birth:  [birth year]        BSA:          1.805 m Patient Age:    57 years        BP:           149/86 mmHg Patient Gender: M               HR:           70 bpm. Exam Location:  Inpatient Procedure: 2D  Echo, 3D Echo, Cardiac Doppler, Color Doppler and Intracardiac            Opacification Agent Indications:    Bacteremia R78.81  History:        Patient has prior history of Echocardiogram examinations, most                 recent 01/17/2021. Arrythmias:Atrial Fibrillation and AV-Block                 2nd, Signs/Symptoms:Shortness of Breath; Risk Factors:Current                 Smoker and Hypertension.  Sonographer:    Greer Pickerel Referring Phys: [id number] Nyxon Strupp J [place]  Sonographer Comments: Image acquisition challenging due to patient body habitus and Image acquisition challenging due to respiratory motion. IMPRESSIONS  1. Left ventricular ejection fraction, by estimation, is 35 to 40%. The left ventricle has moderately decreased function. The left ventricle demonstrates global hypokinesis. Left ventricular diastolic parameters are consistent with Grade I diastolic dysfunction (impaired relaxation). Prominent apical trabeculations, probably not reaching criteria for LV noncompaction.  2. Right ventricular systolic function is normal. The right ventricular size is normal. There is mildly elevated pulmonary artery systolic pressure. The estimated right ventricular systolic pressure is 37.1 mmHg.  3. Left atrial size was moderately dilated.  4. The mitral valve is normal in structure. Trivial mitral valve regurgitation. No evidence of mitral stenosis.  5. The aortic valve is calcified. There is moderate calcification of the aortic valve. Aortic valve regurgitation is not visualized. Aortic valve sclerosis/calcification is present, without  any evidence of aortic stenosis. Cannot rule out vegetation on the aortic valve. Consider TEE for complete evaluation.  6. The inferior vena cava is dilated in size with <50% respiratory variability, suggesting right atrial pressure of 15 mmHg. FINDINGS  Left Ventricle: Left ventricular ejection fraction, by estimation, is 35 to 40%. The left ventricle has moderately decreased  function. The left ventricle demonstrates global hypokinesis. Definity contrast agent was given IV to delineate the left ventricular endocardial borders. The left ventricular internal cavity size was normal in size. There is no left ventricular hypertrophy. Left ventricular diastolic parameters are consistent with Grade I diastolic dysfunction (impaired relaxation). Right Ventricle: The right ventricular size is normal. No increase in right ventricular wall thickness. Right ventricular systolic function is normal. There is mildly elevated pulmonary artery systolic pressure. The tricuspid regurgitant velocity is 2.73  m/s, and with an assumed right atrial pressure of 8 mmHg, the estimated right ventricular systolic pressure is 08.6 mmHg. Left Atrium: Left atrial size was moderately dilated. Right Atrium: Right atrial size was normal in size. Pericardium: There is no evidence of pericardial effusion. Mitral Valve: The mitral valve is normal in structure. Trivial mitral valve regurgitation. No evidence of mitral valve stenosis. Tricuspid Valve: The tricuspid valve is normal in structure. Tricuspid valve regurgitation is trivial. Aortic Valve: The aortic valve is calcified. There is moderate calcification of the aortic valve. Aortic valve regurgitation is not visualized. Aortic valve sclerosis/calcification is present, without any evidence of aortic stenosis. Pulmonic Valve: The pulmonic valve was normal in structure. Pulmonic valve regurgitation is not visualized. Aorta: The aortic root is normal in size and structure. Venous: The inferior vena cava is dilated in size with less than 50% respiratory variability, suggesting right atrial pressure of 15 mmHg. IAS/Shunts: No atrial level shunt detected by color flow Doppler. Additional Comments: A device lead is visualized in the right ventricle.  LEFT VENTRICLE PLAX 2D LVOT diam:     2.20 cm      Diastology LV SV:         75           LV e' medial:    6.20 cm/s LV SV  Index:   41           LV E/e' medial:  9.9 LVOT Area:     3.80 cm     LV e' lateral:   5.55 cm/s                             LV E/e' lateral: 11.0  LV Volumes (MOD) LV vol d, MOD A2C: 93.5 ml LV vol d, MOD A4C: 139.0 ml LV vol s, MOD A2C: 60.9 ml  3D Volume EF: LV vol s, MOD A4C: 49.4 ml  3D EF:        48 % LV SV MOD A2C:     32.6 ml  LV EDV:       175 ml LV SV MOD A4C:     139.0 ml LV ESV:       90 ml LV SV MOD BP:      62.9 ml  LV SV:        84 ml RIGHT VENTRICLE RV S prime:     14.60 cm/s TAPSE (M-mode): 2.0 cm LEFT ATRIUM             Index        RIGHT ATRIUM  Index LA Vol (A2C):   91.3 ml 50.58 ml/m  RA Area:     16.00 cm LA Vol (A4C):   66.4 ml 36.79 ml/m  RA Volume:   39.80 ml  22.05 ml/m LA Biplane Vol: 79.9 ml 44.27 ml/m  AORTIC VALVE LVOT Vmax:   99.20 cm/s LVOT Vmean:  60.100 cm/s LVOT VTI:    0.197 m  AORTA Ao Root diam: 3.70 cm MITRAL VALVE                TRICUSPID VALVE MV Area (PHT): 2.64 cm     TR Peak grad:   29.8 mmHg MV Decel Time: 287 msec     TR Vmax:        273.00 cm/s MR Peak grad: 2.1 mmHg MR Vmax:      72.90 cm/s    SHUNTS MV E velocity: 61.30 cm/s   Systemic VTI:  0.20 m MV A velocity: 114.00 cm/s  Systemic Diam: 2.20 cm MV E/A ratio:  0.54 Dalton McleanMD Electronically signed by Franki Monte Signature Date/Time: 06/03/2022/4:20:34 PM    Final    CT Chest Wo Contrast  Result Date: 06/01/2022 CLINICAL DATA:  Pneumonia, complication suspected. EXAM: CT CHEST WITHOUT CONTRAST TECHNIQUE: Multidetector CT imaging of the chest was performed following the standard protocol without IV contrast. RADIATION DOSE REDUCTION: This exam was performed according to the departmental dose-optimization program which includes automated exposure control, adjustment of the mA and/or kV according to patient size and/or use of iterative reconstruction technique. COMPARISON:  Chest radiograph performed earlier on the same date FINDINGS: Cardiovascular: The heart is normal in size. No  pericardial effusion. Prominent coronary artery and aortic atherosclerotic calcifications. 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 Mediastinum/Nodes: No enlarged mediastinal or axillary lymph nodes. Thyroid gland, trachea, and esophagus demonstrate no significant findings. Lungs/Pleura: Large consolidation in the dependent portion of the right upper lobe with air bronchograms consistent with pneumonia. Emphysematous changes of the lungs. No significant pleural effusion or pneumothorax. Upper Abdomen: Multiple large cysts in the upper pole of the left kidney. Musculoskeletal: No acute osseous abnormality. Thoracic spondylosis with dextroscoliosis. IMPRESSION: 1. Large consolidation in the dependent portion of the right upper lobe with air bronchograms consistent with pneumonia. No appreciable mass. Follow-up examination to resolution is recommended. 2. Emphysematous changes of the lungs. 3. Prominent coronary artery and aortic atherosclerotic calcifications. 4. Multiple large cysts in the upper pole of the left kidney. 5. Aortic atherosclerosis. Aortic Atherosclerosis (ICD10-I70.0) and Emphysema (ICD10-J43.9). Electronically Signed   By: Keane Police D.O.   On: 06/01/2022 14:00   DG Chest 1 View  Result Date: 06/01/2022 CLINICAL DATA:  Shortness of breath. EXAM: CHEST  1 VIEW COMPARISON:  02/02/2022 FINDINGS: Low volume film. Interval development of diffuse airspace disease in the right upper lobe, most suggestive of pneumonia. Minimal atelectasis noted at the left lung base with possible tiny left effusion. Left permanent pacemaker again noted. IMPRESSION: Interval development of diffuse airspace disease in the right upper lobe, most suggestive of pneumonia. Close follow-up recommended to ensure resolution. CT chest could be used to exclude central obstructing mass lesion as clinically warranted. Electronically Signed   By: Misty Stanley M.D.   On: 06/01/2022 12:26   MR ABDOMEN W WO  CONTRAST  Result Date: 05/20/2022 CLINICAL DATA:  Metastatic carcinoid, renal lesions. EXAM: MRI ABDOMEN WITHOUT AND WITH CONTRAST TECHNIQUE: Multiplanar multisequence MR imaging of the abdomen was performed both before and after the administration of intravenous contrast. 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 Other nodular lesions along the right liver capsule/diaphragm are likewise mildly progressive. Pancreas:  Unremarkable Spleen:  Unremarkable Adrenals/Urinary Tract: Adrenal glands unremarkable. There are complex Bosniak category 1 and category 2 cysts are present in the kidneys. The most complex of the renal lesions is a 2.2 by 2.2 cm exophytic lesion of the left mid kidney posterolaterally shown on image 57 series 3 with low T2 signal and a sagittally oriented thick band of high T1 signal precontrast. Because of misregistration this lesion is not perfectly aligned on the pre and postcontrast images but based on my piecemeal region of interest measurements, the dominant T1 hyperintense elements do not appear to be enhancing. Ten septal enhancement of about 2 mm thickness medially in the lesion on image 56 series 22. 2 mm thick posterior wall likely enhancing. Overall given the heterogeneity and septal enhancement, this is classified as Bosniak category IIF. There was a complex but homogeneous lesion in this location on 01/16/2011 measuring 2.0 cm in diameter. Stomach/Bowel: Unremarkable Vascular/Lymphatic: Atherosclerosis is present, including aortoiliac atherosclerotic disease. No pathologic adenopathy. Other: Peritoneal disease better shown on prior PET-CT is only faintly apparent, for example in the left upper  quadrant along the omentum on image 16 series 4. Musculoskeletal: Lumbar spondylosis and degenerative disc disease. IMPRESSION: 1. 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 Compared to the remote 2012 exam this demonstrates increase complexity along with enhancing septa. Based on current guidelines, follow up renal protocol MRI in 6 months time is recommended. 2. Progressive hepatic metastatic disease. 3. Peritoneal disease better shown on prior PET-CT. 4. Lumbar spondylosis and degenerative disc disease. Electronically Signed   By: WVan ClinesM.D.   On: 05/20/2022 16:15     Subjective: Patient seen examined bedside, resting comfortably.  Lying in bed.  Underwent TEE this morning that was negative for endocarditis.  Seen by ID underwent amoxicillin challenge in which he tolerated without issue.  ID recommends amoxicillin on discharge for 14 days total from negative culture days.  Patient wishes to discharge home today.  No other questions or concerns at this time.  Denies headache, no dizziness, no chest pain, no palpitations, no shortness of breath, no abdominal pain, no focal weakness, no fever/chills/night sweats, no nausea/vomiting/diarrhea, no paresthesias.  No acute events overnight per nursing staff.  Discharge Exam: Vitals:   06/05/22 1318 06/05/22 1350  BP: (!) 179/94 (!) 158/102  Pulse: 70 68  Resp: 18 18  Temp:    SpO2: 99% 98%   Vitals:   06/05/22 1200 06/05/22 1253 06/05/22 1318 06/05/22 1350  BP: (!) 171/90 (!) 167/97 (!) 179/94 (!) 158/102  Pulse: 78 65 70 68  Resp: '18 18 18 18  '$ Temp:      TempSrc:      SpO2: 100% 99% 99% 98%  Weight:      Height:        Physical Exam: GEN: NAD, alert and oriented x 3, chronically ill/thin/cachectic/elderly in appearance HEENT: NCAT, PERRL, EOMI, sclera clear, MMM PULM: CTAB w/o wheezes/crackles, normal respiratory effort, on room air CV: RRR w/o M/G/R GI: abd soft, NTND, NABS, no R/G/M MSK: no peripheral edema,  muscle strength globally intact 5/5 bilateral  upper/lower extremities; significant muscle wasting/fat depletion noted NEURO: CN II-XII intact, no focal deficits, sensation to light touch intact PSYCH: normal mood/affect Integumentary: dry/intact, no rashes or wounds    The results of significant diagnostics from this hospitalization (including imaging, microbiology, ancillary and laboratory) are listed below for reference.     Microbiology: Recent Results (from the past 240 hour(s))  Resp Panel by RT-PCR (Flu A&B, Covid) Anterior Nasal Swab     Status: None   Collection Time: 06/01/22 11:58 AM   Specimen: Anterior Nasal Swab  Result Value Ref Range Status   SARS Coronavirus 2 by RT PCR NEGATIVE NEGATIVE Final    Comment: (NOTE) SARS-CoV-2 target nucleic acids are NOT DETECTED.  The SARS-CoV-2 RNA is generally detectable in upper respiratory specimens during the acute phase of infection. The lowest concentration of SARS-CoV-2 viral copies this assay can detect is 138 copies/mL. A negative result does not preclude SARS-Cov-2 infection and should not be used as the sole basis for treatment or other patient management decisions. A negative result may occur with  improper specimen collection/handling, submission of specimen other than nasopharyngeal swab, presence of viral mutation(s) within the areas targeted by this assay, and inadequate number of viral copies(<138 copies/mL). A negative result must be combined with clinical observations, patient history, and epidemiological information. The expected result is Negative.  Fact Sheet for Patients:  EntrepreneurPulse.com.au  Fact Sheet for Healthcare Providers:  IncredibleEmployment.be  This test is no t yet approved or cleared by the [place] FDA and  has been authorized for detection and/or diagnosis of SARS-CoV-2 by FDA under an Emergency Use Authorization (EUA). This EUA will remain  in  effect (meaning this test can be used) for the duration of the COVID-19 declaration under Section 564(b)(1) of the Act, 21 U.S.C.section 360bbb-3(b)(1), unless the authorization is terminated  or revoked sooner.       Influenza A by PCR NEGATIVE NEGATIVE Final   Influenza B by PCR NEGATIVE NEGATIVE Final    Comment: (NOTE) The Xpert Xpress SARS-CoV-2/FLU/RSV plus assay is intended as an aid in the diagnosis of influenza from Nasopharyngeal swab specimens and should not be used as a sole basis for treatment. Nasal washings and aspirates are unacceptable for Xpert Xpress SARS-CoV-2/FLU/RSV testing.  Fact Sheet for Patients: EntrepreneurPulse.com.au  Fact Sheet for Healthcare Providers: IncredibleEmployment.be  This test is not yet approved or cleared by the [place] FDA and has been authorized for detection and/or diagnosis of SARS-CoV-2 by FDA under an Emergency Use Authorization (EUA). This EUA will remain in effect (meaning this test can be used) for the duration of the COVID-19 declaration under Section 564(b)(1) of the Act, 21 U.S.C. section 360bbb-3(b)(1), unless the authorization is terminated or revoked.  Performed at [name], [place] [address] [place], [place] [address]   Culture, blood (routine x 2)     Status: Abnormal   Collection Time: 06/01/22  3:48 PM   Specimen: BLOOD LEFT FOREARM  Result Value Ref Range Status   Specimen Description BLOOD LEFT FOREARM  Final   Special Requests   Final    BOTTLES DRAWN AEROBIC AND ANAEROBIC Blood Culture adequate volume   Culture  Setup Time   Final    GRAM POSITIVE COCCI IN CHAINS IN BOTH AEROBIC AND ANAEROBIC BOTTLES CRITICAL RESULT CALLED TO, READ BACK BY AND VERIFIED WITH: [place] ON 06/02/22 @ 1538 BY DRT Performed at [name], [place] [address] [place], [place] [address]  Culture STREPTOCOCCUS PNEUMONIAE (A)  Final   Report Status 06/04/2022 FINAL   Final   Organism ID, Bacteria STREPTOCOCCUS PNEUMONIAE  Final      Susceptibility   Streptococcus pneumoniae - MIC*    ERYTHROMYCIN <=0.12 SENSITIVE Sensitive     LEVOFLOXACIN 0.5 SENSITIVE Sensitive     VANCOMYCIN <=0.12 SENSITIVE Sensitive     PENICILLIN (meningitis) 0.25 RESISTANT Resistant     PENO - penicillin 0.25      PENICILLIN (non-meningitis) 0.25 SENSITIVE Sensitive     PENICILLIN (oral) 0.25 INTERMEDIATE Intermediate     CEFTRIAXONE (non-meningitis) <=0.12 SENSITIVE Sensitive     CEFTRIAXONE (meningitis) <=0.12 SENSITIVE Sensitive     * STREPTOCOCCUS PNEUMONIAE  Blood Culture ID Panel (Reflexed)     Status: Abnormal   Collection Time: 06/01/22  3:48 PM  Result Value Ref Range Status   Enterococcus faecalis NOT DETECTED NOT DETECTED Final   Enterococcus Faecium NOT DETECTED NOT DETECTED Final   Listeria monocytogenes NOT DETECTED NOT DETECTED Final   Staphylococcus species NOT DETECTED NOT DETECTED Final   Staphylococcus aureus (BCID) NOT DETECTED NOT DETECTED Final   Staphylococcus epidermidis NOT DETECTED NOT DETECTED Final   Staphylococcus lugdunensis NOT DETECTED NOT DETECTED Final   Streptococcus species DETECTED (A) NOT DETECTED Final    Comment: CRITICAL RESULT CALLED TO, READ BACK BY AND VERIFIED WITH: PHARMD ANDREW MEYER ON 06/02/22 @ 1538 BY DRT    Streptococcus agalactiae NOT DETECTED NOT DETECTED Final   Streptococcus pneumoniae DETECTED (A) NOT DETECTED Final    Comment: CRITICAL RESULT CALLED TO, READ BACK BY AND VERIFIED WITH: PHARMD ANDREW MEYER ON 06/02/22 @ 1538 BY DRT    Streptococcus pyogenes NOT DETECTED NOT DETECTED Final   A.calcoaceticus-baumannii NOT DETECTED NOT DETECTED Final   Bacteroides fragilis NOT DETECTED NOT DETECTED Final   Enterobacterales NOT DETECTED NOT DETECTED Final   Enterobacter cloacae complex NOT DETECTED NOT DETECTED Final   Escherichia coli NOT DETECTED NOT DETECTED Final   Klebsiella aerogenes NOT DETECTED NOT DETECTED  Final   Klebsiella oxytoca NOT DETECTED NOT DETECTED Final   Klebsiella pneumoniae NOT DETECTED NOT DETECTED Final   Proteus species NOT DETECTED NOT DETECTED Final   Salmonella species NOT DETECTED NOT DETECTED Final   Serratia marcescens NOT DETECTED NOT DETECTED Final   Haemophilus influenzae NOT DETECTED NOT DETECTED Final   Neisseria meningitidis NOT DETECTED NOT DETECTED Final   Pseudomonas aeruginosa NOT DETECTED NOT DETECTED Final   Stenotrophomonas maltophilia NOT DETECTED NOT DETECTED Final   Candida albicans NOT DETECTED NOT DETECTED Final   Candida auris NOT DETECTED NOT DETECTED Final   Candida glabrata NOT DETECTED NOT DETECTED Final   Candida krusei NOT DETECTED NOT DETECTED Final   Candida parapsilosis NOT DETECTED NOT DETECTED Final   Candida tropicalis NOT DETECTED NOT DETECTED Final   Cryptococcus neoformans/gattii NOT DETECTED NOT DETECTED Final    Comment: Performed at [name], [place] [address] [place], [place] [address]  Culture, blood (routine x 2)     Status: Abnormal   Collection Time: 06/01/22  4:00 PM   Specimen: BLOOD  Result Value Ref Range Status   Specimen Description BLOOD LEFT ANTECUBITAL  Final   Special Requests   Final    BOTTLES DRAWN AEROBIC AND ANAEROBIC Blood Culture results may not be optimal due to an inadequate volume of blood received in culture bottles   Culture  Setup Time   Final    GRAM POSITIVE COCCI IN CHAINS IN BOTH  AEROBIC AND ANAEROBIC BOTTLES    Culture (A)  Final    STREPTOCOCCUS PNEUMONIAE SUSCEPTIBILITIES PERFORMED ON PREVIOUS CULTURE WITHIN THE LAST 5 DAYS. Performed at [name], [place] [address] [place], [place] [address]    Report Status 06/04/2022 FINAL  Final  Culture, blood (Routine X 2) w Reflex to ID Panel     Status: None (Preliminary result)   Collection Time: 06/03/22 12:47 PM   Specimen: BLOOD RIGHT ARM  Result Value Ref Range Status   Specimen Description BLOOD RIGHT ARM  Final   Special  Requests   Final    BOTTLES DRAWN AEROBIC ONLY Blood Culture adequate volume   Culture   Final    NO GROWTH 2 DAYS Performed at [name], [place] [address], [place], [place] [address]    Report Status PENDING  Incomplete     Labs: BNP (last 3 results) No results for input(s): "BNP" in the last 8760 hours. Basic Metabolic Panel: Recent Labs  Lab 06/01/22 1158 06/02/22 0350 06/03/22 0248 06/04/22 0509 06/05/22 0428  NA 137 137 141 139 143  K 3.6 3.7 3.4* 3.6 3.3*  CL 115* 112* 109 110 108  CO2 14* 14* 16* 16* 19*  GLUCOSE 144* 136* 141* 132* 124*  BUN 74* 80* 81* 77* 76*  CREATININE 4.42* 4.34* 4.74* 4.50* 4.46*  CALCIUM 7.9* 8.1* 8.6* 7.7* 8.4*   Liver Function Tests: Recent Labs  Lab 06/01/22 1158  AST 14*  ALT 13  ALKPHOS 43  BILITOT 0.7  PROT 5.7*  ALBUMIN 2.4*   No results for input(s): "LIPASE", "AMYLASE" in the last 168 hours. No results for input(s): "AMMONIA" in the last 168 hours. CBC: Recent Labs  Lab 06/01/22 1158 06/01/22 2020 06/02/22 0350 06/03/22 0248 06/04/22 0815 06/05/22 0428  WBC 20.6*  --  24.6* 18.0* 14.6* 14.6*  HGB 8.2* 9.1* 8.4* 7.0* 11.5* 11.3*  HCT 22.2* 25.1* 22.4* 19.5* 31.6* 31.1*  MCV 86.4  --  84.2 84.8 83.8 83.8  PLT 236  --  222 248 257 287   Cardiac Enzymes: No results for input(s): "CKTOTAL", "CKMB", "CKMBINDEX", "TROPONINI" in the last 168 hours. BNP: Invalid input(s): "POCBNP" CBG: No results for input(s): "GLUCAP" in the last 168 hours. D-Dimer No results for input(s): "DDIMER" in the last 72 hours. Hgb A1c No results for input(s): "HGBA1C" in the last 72 hours. Lipid Profile No results for input(s): "CHOL", "HDL", "LDLCALC", "TRIG", "CHOLHDL", "LDLDIRECT" in the last 72 hours. Thyroid function studies No results for input(s): "TSH", "T4TOTAL", "T3FREE", "THYROIDAB" in the last 72 hours.  Invalid input(s): "FREET3" Anemia work up Recent Labs    06/03/22 0248  VITAMINB12 554  FOLATE 19.9   FERRITIN 339*  TIBC 119*  IRON 11*  RETICCTPCT 1.3   Urinalysis    Component Value Date/Time   COLORURINE YELLOW 02/01/2022 1912   APPEARANCEUR HAZY (A) 02/01/2022 1912   LABSPEC 1.011 02/01/2022 1912   PHURINE 5.0 02/01/2022 1912   GLUCOSEU NEGATIVE 02/01/2022 1912   HGBUR NEGATIVE 02/01/2022 1912   BILIRUBINUR NEGATIVE 02/01/2022 1912   KETONESUR NEGATIVE 02/01/2022 1912   PROTEINUR 100 (A) 02/01/2022 1912   UROBILINOGEN 0.2 08/23/2012 2225   NITRITE NEGATIVE 02/01/2022 1912   LEUKOCYTESUR NEGATIVE 02/01/2022 1912   Sepsis Labs Recent Labs  Lab 06/02/22 0350 06/03/22 0248 06/04/22 0815 06/05/22 0428  WBC 24.6* 18.0* 14.6* 14.6*   Microbiology Recent Results (from the past 240 hour(s))  Resp Panel by RT-PCR (Flu A&B, Covid) Anterior  Nasal Swab     Status: None   Collection Time: 06/01/22 11:58 AM   Specimen: Anterior Nasal Swab  Result Value Ref Range Status   SARS Coronavirus 2 by RT PCR NEGATIVE NEGATIVE Final    Comment: (NOTE) SARS-CoV-2 target nucleic acids are NOT DETECTED.  The SARS-CoV-2 RNA is generally detectable in upper respiratory specimens during the acute phase of infection. The lowest concentration of SARS-CoV-2 viral copies this assay can detect is 138 copies/mL. A negative result does not preclude SARS-Cov-2 infection and should not be used as the sole basis for treatment or other patient management decisions. A negative result may occur with  improper specimen collection/handling, submission of specimen other than nasopharyngeal swab, presence of viral mutation(s) within the areas targeted by this assay, and inadequate number of viral copies(<138 copies/mL). A negative result must be combined with clinical observations, patient history, and epidemiological information. The expected result is Negative.  Fact Sheet for Patients:  EntrepreneurPulse.com.au  Fact Sheet for Healthcare Providers:   IncredibleEmployment.be  This test is no t yet approved or cleared by the [place] FDA and  has been authorized for detection and/or diagnosis of SARS-CoV-2 by FDA under an Emergency Use Authorization (EUA). This EUA will remain  in effect (meaning this test can be used) for the duration of the COVID-19 declaration under Section 564(b)(1) of the Act, 21 U.S.C.section 360bbb-3(b)(1), unless the authorization is terminated  or revoked sooner.       Influenza A by PCR NEGATIVE NEGATIVE Final   Influenza B by PCR NEGATIVE NEGATIVE Final    Comment: (NOTE) The Xpert Xpress SARS-CoV-2/FLU/RSV plus assay is intended as an aid in the diagnosis of influenza from Nasopharyngeal swab specimens and should not be used as a sole basis for treatment. Nasal washings and aspirates are unacceptable for Xpert Xpress SARS-CoV-2/FLU/RSV testing.  Fact Sheet for Patients: EntrepreneurPulse.com.au  Fact Sheet for Healthcare Providers: IncredibleEmployment.be  This test is not yet approved or cleared by the [place] FDA and has been authorized for detection and/or diagnosis of SARS-CoV-2 by FDA under an Emergency Use Authorization (EUA). This EUA will remain in effect (meaning this test can be used) for the duration of the COVID-19 declaration under Section 564(b)(1) of the Act, 21 U.S.C. section 360bbb-3(b)(1), unless the authorization is terminated or revoked.  Performed at [name], [place] [address] [place], [place] [address]   Culture, blood (routine x 2)     Status: Abnormal   Collection Time: 06/01/22  3:48 PM   Specimen: BLOOD LEFT FOREARM  Result Value Ref Range Status   Specimen Description BLOOD LEFT FOREARM  Final   Special Requests   Final    BOTTLES DRAWN AEROBIC AND ANAEROBIC Blood Culture adequate volume   Culture  Setup Time   Final    GRAM POSITIVE COCCI IN CHAINS IN BOTH AEROBIC AND ANAEROBIC  BOTTLES CRITICAL RESULT CALLED TO, READ BACK BY AND VERIFIED WITH: [place] ON 06/02/22 @ 1538 BY DRT Performed at [name], [place] [address], [place], [place] [address]    Culture STREPTOCOCCUS PNEUMONIAE (A)  Final   Report Status 06/04/2022 FINAL  Final   Organism ID, Bacteria STREPTOCOCCUS PNEUMONIAE  Final      Susceptibility   Streptococcus pneumoniae - MIC*    ERYTHROMYCIN <=0.12 SENSITIVE Sensitive     LEVOFLOXACIN 0.5 SENSITIVE Sensitive     VANCOMYCIN <=0.12 SENSITIVE Sensitive     PENICILLIN (meningitis) 0.25 RESISTANT Resistant  PENO - penicillin 0.25      PENICILLIN (non-meningitis) 0.25 SENSITIVE Sensitive     PENICILLIN (oral) 0.25 INTERMEDIATE Intermediate     CEFTRIAXONE (non-meningitis) <=0.12 SENSITIVE Sensitive     CEFTRIAXONE (meningitis) <=0.12 SENSITIVE Sensitive     * STREPTOCOCCUS PNEUMONIAE  Blood Culture ID Panel (Reflexed)     Status: Abnormal   Collection Time: 06/01/22  3:48 PM  Result Value Ref Range Status   Enterococcus faecalis NOT DETECTED NOT DETECTED Final   Enterococcus Faecium NOT DETECTED NOT DETECTED Final   Listeria monocytogenes NOT DETECTED NOT DETECTED Final   Staphylococcus species NOT DETECTED NOT DETECTED Final   Staphylococcus aureus (BCID) NOT DETECTED NOT DETECTED Final   Staphylococcus epidermidis NOT DETECTED NOT DETECTED Final   Staphylococcus lugdunensis NOT DETECTED NOT DETECTED Final   Streptococcus species DETECTED (A) NOT DETECTED Final    Comment: CRITICAL RESULT CALLED TO, READ BACK BY AND VERIFIED WITH: PHARMD ANDREW MEYER ON 06/02/22 @ 1538 BY DRT    Streptococcus agalactiae NOT DETECTED NOT DETECTED Final   Streptococcus pneumoniae DETECTED (A) NOT DETECTED Final    Comment: CRITICAL RESULT CALLED TO, READ BACK BY AND VERIFIED WITH: PHARMD ANDREW MEYER ON 06/02/22 @ 1538 BY DRT    Streptococcus pyogenes NOT DETECTED NOT DETECTED Final   A.calcoaceticus-baumannii NOT DETECTED NOT DETECTED Final    Bacteroides fragilis NOT DETECTED NOT DETECTED Final   Enterobacterales NOT DETECTED NOT DETECTED Final   Enterobacter cloacae complex NOT DETECTED NOT DETECTED Final   Escherichia coli NOT DETECTED NOT DETECTED Final   Klebsiella aerogenes NOT DETECTED NOT DETECTED Final   Klebsiella oxytoca NOT DETECTED NOT DETECTED Final   Klebsiella pneumoniae NOT DETECTED NOT DETECTED Final   Proteus species NOT DETECTED NOT DETECTED Final   Salmonella species NOT DETECTED NOT DETECTED Final   Serratia marcescens NOT DETECTED NOT DETECTED Final   Haemophilus influenzae NOT DETECTED NOT DETECTED Final   Neisseria meningitidis NOT DETECTED NOT DETECTED Final   Pseudomonas aeruginosa NOT DETECTED NOT DETECTED Final   Stenotrophomonas maltophilia NOT DETECTED NOT DETECTED Final   Candida albicans NOT DETECTED NOT DETECTED Final   Candida auris NOT DETECTED NOT DETECTED Final   Candida glabrata NOT DETECTED NOT DETECTED Final   Candida krusei NOT DETECTED NOT DETECTED Final   Candida parapsilosis NOT DETECTED NOT DETECTED Final   Candida tropicalis NOT DETECTED NOT DETECTED Final   Cryptococcus neoformans/gattii NOT DETECTED NOT DETECTED Final    Comment: Performed at [facility], [place] [address], [place], [place] [address]  Culture, blood (routine x 2)     Status: Abnormal   Collection Time: 06/01/22  4:00 PM   Specimen: BLOOD  Result Value Ref Range Status   Specimen Description BLOOD LEFT ANTECUBITAL  Final   Special Requests   Final    BOTTLES DRAWN AEROBIC AND ANAEROBIC Blood Culture results may not be optimal due to an inadequate volume of blood received in culture bottles   Culture  Setup Time   Final    GRAM POSITIVE COCCI IN CHAINS IN BOTH AEROBIC AND ANAEROBIC BOTTLES    Culture (A)  Final    STREPTOCOCCUS PNEUMONIAE SUSCEPTIBILITIES PERFORMED ON PREVIOUS CULTURE WITHIN THE LAST 5 DAYS. Performed at [name], [place] [address] [place], [place] [address]    Report Status  06/04/2022 FINAL  Final  Culture, blood (Routine X 2) w Reflex to ID Panel     Status: None (Preliminary result)   Collection Time:  06/03/22 12:47 PM   Specimen: BLOOD RIGHT ARM  Result Value Ref Range Status   Specimen Description BLOOD RIGHT ARM  Final   Special Requests   Final    BOTTLES DRAWN AEROBIC ONLY Blood Culture adequate volume   Culture   Final    NO GROWTH 2 DAYS Performed at [name], [address], [place], [place] [address]    Report Status PENDING  Incomplete     Time coordinating discharge: Over 30 minutes  SIGNED:   Dashia Caldeira J [place] DO  [facility] 06/05/2022, 2:53 PM

## 2022-06-05 NOTE — Anesthesia Procedure Notes (Signed)
Procedure Name: General with mask airway Date/Time: 06/05/2022 8:02 AM  Performed by: Erick Colace, CRNAPre-anesthesia Checklist: Patient identified, Emergency Drugs available, Suction available and Patient being monitored Patient Re-evaluated:Patient Re-evaluated prior to induction Oxygen Delivery Method: Nasal cannula Preoxygenation: Pre-oxygenation with 100% oxygen Induction Type: IV induction Airway Equipment and Method: Bite block Dental Injury: Teeth and Oropharynx as per pre-operative assessment

## 2022-06-05 NOTE — Progress Notes (Incomplete)
Amox challenge

## 2022-06-05 NOTE — Progress Notes (Signed)
  Echocardiogram 2D Echocardiogram has been performed.  Alan [name] 06/05/2022, 8:25 AM

## 2022-06-05 NOTE — CV Procedure (Signed)
Brief TEE Note  LVEF 45-50% No LA/LAA thrombus or masses Trivial to mild MR, TR, PR and AR No evidence of endocarditis  For additional details see full report.  Alan [name] C. Oval Linsey, MD, [facility] 06/05/2022 8:15 AM

## 2022-06-08 ENCOUNTER — Other Ambulatory Visit: Payer: Self-pay | Admitting: Internal Medicine

## 2022-06-08 ENCOUNTER — Encounter (HOSPITAL_COMMUNITY): Payer: Self-pay | Admitting: Cardiovascular Disease

## 2022-06-08 ENCOUNTER — Telehealth: Payer: Self-pay | Admitting: Internal Medicine

## 2022-06-08 ENCOUNTER — Telehealth: Payer: Self-pay | Admitting: *Deleted

## 2022-06-08 ENCOUNTER — Telehealth: Payer: Self-pay

## 2022-06-08 DIAGNOSIS — C7A019 Malignant carcinoid tumor of the small intestine, unspecified portion: Secondary | ICD-10-CM

## 2022-06-08 LAB — CULTURE, BLOOD (ROUTINE X 2)
Culture: NO GROWTH
Special Requests: ADEQUATE

## 2022-06-08 NOTE — Telephone Encounter (Signed)
Monica, Education officer, museum with Palliative Care, called on behalf of the patient. Patient has been out of the hospital since Friday, but is not eating and has not gotten out of the bed. She also received a call from his therapist with [name] who is concerned about his well being. They are thinking hospice would best suit him and would like orders put in for hospice. A good callback number for Brayton Layman is her call at [phone].

## 2022-06-08 NOTE — Telephone Encounter (Signed)
(  12:37 pm) PC SW returned call to patient's wife-[name]. She called in to advise that patient was in the hospital 12-4 thru 12-8 for pneumonia and sepsis and she feels he may have been discharged to soon. He was scheduled with [name] today as he has a PT referral from the hospital. She report that patient is not eating and has only taken a few sips of a protein drink today. He is no longer getting out of bed and he is sleeping a lot during the day. SW advised her that it seems that a hospice consult may be appropriate for patient at his time. [name] agreed stating, "he is not getting any better". She advised that they talked with her about hospice while he was in the hospital. SW advised her that she will reach out to patient's PCP to update them and request a hospice consult. SW provided education on the hospice referral process. SW to follow-up with if/when order is received.   (12:40 pm) SW consulted with Eliezer Lofts, PT at [facility], to advise her that she will call patient's PCP to request a hospice consult. Eliezer Lofts endorsed that hospice may be appropriate for patient due to a rapid decline.   (12:48 pm) SW telephoned Dr. Ronnald Ramp office and spoke with Oley Balm. SW updated her on patient decline and wife's desire for hospice consult. Oley Balm advises that she will discuss this with Dr. Ronnald Ramp and will follow-up with regarding an order.

## 2022-06-08 NOTE — Patient Outreach (Signed)
Care Coordination [facility] Note Transition Care Management Follow-up Telephone Call Date of discharge and from where: Friday 06/05/22, Alan [name]; sepsis- pneumonia How have you been since you were released from the hospital? Per spouse/ caregiver Renee ([name])- on [facility] DPR: "I don't know why they sent him home so soon.... ever since he came home, he is so weak he can't even get out of bed; I have called Palliative care on Saturday, the day after he came home and they told me that they would call me by today to set up a home visit- but I have not heard anything at all back from them yet.  Have not yet heard from [facility] health yet either.  I don't know if he may need a hospital bed, or more services.  Things are just not going well since he came home on Friday and right now, it is my son and I trying to do everything for him.  If he wasn't so weak, where he doesn't even get out of bed, I could take him to the doctors office, but right now, he won't even get out of bed for [place] to take him, so I am not making an appointment with Dr. Ronnald Ramp until either [name] or [name] comes in.... I have started the antibiotics they prescribed when he left the hospital, and he is taking them, but he sure isn't eating very much.  I would like to avoid him going back to the hospital, because it seems they are just going to send him right back home" Any questions or concerns? Yes: not heard from home health team or Palliative care team yet; possible need for hospital bed; patient condition not better post-recent hospital discharge; spouse reports patient not getting out of bed for her to bring to doctor appointment  Care Coordination Activities: 10:30 am: called [facility] [phone]) spoke with Levada Dy and then [place], clinical supervisor: confirmed that home health PT referral was received; they will reach out to patient's caregiver today; made them aware that Palliative care is also involved and that I would  reach out to PCP to request nursing evaluation as well 10:39 am: called [facility] [phone]) spoke with Lenda Kelp: Lenda Kelp confirms patient is active with Palliative Care team; updated her that caregiver had placed call on Saturday 06/06/22 to [place] and would like call back and home visit set up for post-hospital discharge evaluation, with goals of care discussion- Lenda Kelp assures me that she will send message to Palliative Care team and will ask that caregiver be contacted today; made her aware that caregiver is asking about possible need for hospital bed  Provided education around reasons to call EMS/ seek emergent/ urgent care  Care Coordination outreach completed with PCP to make aware caregiver's clinical concerns, of possible need for hospital bed; request for Home Health RN assessment; need for possible updated goals of care/ hospice assessment  Scheduled with practice RN CM Care Coordinator to follow up on all of above in 2 days on 06/10/22; provided my direct contact information should needs/ concerns arise prior to scheduled follow up call  Items Reviewed: Did the pt receive and understand the discharge instructions provided? Yes  Medications obtained and verified? Yes  Other? No  Any new allergies since your discharge? No  Dietary orders reviewed? Yes Do you have support at home? Yes  spouse/ caregiver and her son are currently providing total care at home post-recent hospital discharge: this is NOT patient's prior baseline  Home Care  and Equipment/Supplies: Were home health services ordered? yes If so, what is the name of the agency? [name]- care coordination outreach with agency completed  Has the agency set up a time to come to the patient's home? No-care coordination outreach with agency completed as above  Were any new equipment or medical supplies ordered?  No What is the name of the medical supply agency? N/A Were you able to get the supplies/equipment?  not applicable Do you have any questions related to the use of the equipment or supplies? No  Functional Questionnaire: (I = Independent and D = Dependent) ADLs: D  spouse/ caregiver and her son are currently providing total care at home post-recent hospital discharge: this is NOT patient's prior baseline  Bathing/Dressing- D  spouse/ caregiver and her son are currently providing total care at home post-recent hospital discharge: this is NOT patient's prior baseline  Meal Prep- D  Eating- I  Maintaining continence- D  spouse/ caregiver and her son are currently providing total care at home post-recent hospital discharge: this is NOT patient's prior baseline  Transferring/Ambulation- D spouse/ caregiver and her son are currently providing total care at home post-recent hospital discharge: this is NOT patient's prior baseline   Managing Meds- D  spouse/ caregiver and her son are currently providing total care at home post-recent hospital discharge: this is NOT patient's prior baseline  Follow up appointments reviewed:  PCP Hospital f/u appt confirmed? No  Scheduled to see PCP Dr. Ronnald Ramp- on Wednesday, 07/01/21 @ 10:40 am Specialist Hospital f/u appt confirmed? Yes  Scheduled to see oncology provider on Monday 06/15/22 @ 3:00 pm Are transportation arrangements needed?  Not normally-- however, caregiver reports patient is so weak post-hospital discharge she doesn't think he will be able to get into car to be transported; see care coordination activities as above  If their condition worsens, is the pt aware to call PCP or go to the Emergency Dept.? Yes Was the patient provided with contact information for the PCP's office or ED? No caregiver declined- reports she already has numbers Was to pt encouraged to call back with questions or concerns? Yes- provided my direct contact information should concerns/ needs arise prior to scheduled RN CM Care Coordinator outreach on 06/10/22  SDOH assessments  and interventions completed:   Yes SDOH Interventions Today    Flowsheet Row Most Recent Value  SDOH Interventions   Food Insecurity Interventions Intervention Not Indicated  Transportation Interventions Intervention Not Indicated  [spouse normally provides transportation- however, she reports patient has been "too weak" to get into the care after recent hospitalization (discharge 06/05/22)]      Care Coordination Interventions:  Referred for Care Coordination Services:  [place] to home health agency; palliative care agency, RN CM Care Coordinator, PCP; provided education around reasons to seek emergent/ urgent care    Encounter Outcome:  Pt. Visit Completed    [name], RN, BSN, CCRN Alumnus RN CM Care Coordination/ Transition of [place] Management [phone]: direct office

## 2022-06-09 ENCOUNTER — Telehealth: Payer: Self-pay | Admitting: Internal Medicine

## 2022-06-09 NOTE — Telephone Encounter (Signed)
Patient needs a refill on

## 2022-06-09 NOTE — Telephone Encounter (Signed)
LVM for [facility] stating that referral for hospice was entered.

## 2022-06-10 ENCOUNTER — Ambulatory Visit: Payer: Self-pay

## 2022-06-10 ENCOUNTER — Telehealth: Payer: Self-pay | Admitting: *Deleted

## 2022-06-10 LAB — CULTURE, BLOOD (ROUTINE X 2)
Culture: NO GROWTH
Special Requests: ADEQUATE

## 2022-06-10 NOTE — Patient Outreach (Signed)
  Care Coordination   Multidisciplinary Case Review Note    06/10/2022 Name: Alan [name] MRN: [id number] DOB: Nov 14, 1940  Alan [name] is a 81 y.o. year old male who sees [name], MD for primary care.  The multidisciplinary care team met today to review patient care needs and barriers.    SDOH assessments and interventions completed:  No SDOH previously completed during patient outreaches   Care Coordination Interventions Activated:  Yes   Care Coordination Interventions:  Yes, provided   Follow up plan:  RN CM Care Coordinator will outreach as scheduled to determine further/ ongoing care coordination needs; will re-present to multidisciplinary team as indicated    Multidisciplinary Team Attendees:   Reginia Naas, RN CM; Thea Silversmith, RN CM; Neldon Labella, RN CM; Peter Garter, RN CM; Theadore Nan, LCSW  Scribe for Multidisciplinary Case Review:  No  [name], RN, BSN, CCRN Alumnus RN CM Care Coordination/ Transition of [place] Management [phone]: direct office

## 2022-06-10 NOTE — Patient Instructions (Addendum)
Visit Information  Thank you for taking time to visit with me today. Please don't hesitate to contact me if I can be of assistance to you.   Following are the goals we discussed today:   Goals Addressed             This Visit's Progress    COMPLETED: Care Coordination Activities       Care Coordination Interventions: Active Listening Discuss the benefits/support provided by Hospice. Encouraged Mrs. Degeorge to contact Hospice for care needs Emotional Support Provided        If you are experiencing a Mental Health or [employer] or need someone to talk to, please call the Suicide and Crisis Lifeline: 988  Patient verbalizes understanding of instructions and care plan provided today and agrees to view in [place]. Active MyChart status and patient understanding of how to access instructions and care plan via MyChart confirmed with patient.     Thea Silversmith, RN, MSN, BSN, [place] Coordinator [phone]

## 2022-06-10 NOTE — Patient Outreach (Signed)
  Care Coordination   Follow Up Visit Note   06/10/2022 Name: Alan [name] MRN: [id number] DOB: Sep 03, 1940  Alan [name] is a 81 y.o. year old male who sees [name], MD for primary care. I spoke with  Josie Saunders Spelman by phone today.  What matters to the patients health and wellness today?  She states they went to [employer] and had a good time, adding Mr. Brisbin did well. She reports patient was admitted 06/01/22-06/05/22 with pneumonia and has had decline in health. Mrs. Mizner states she has turned over patient's care to [facility].   Goals Addressed             This Visit's Progress    COMPLETED: Care Coordination Activities       Care Coordination Interventions: Active Listening Discuss the benefits/support provided by Hospice. Encouraged Mrs. [place] to contact Hospice for care needs Emotional Support Provided        SDOH assessments and interventions completed:  Yes  Care Coordination Interventions:  Yes, provided   Follow up plan: No further intervention required.   Encounter Outcome:  Pt. Visit Completed   Thea Silversmith, RN, MSN, BSN, [place] Coordinator [phone]

## 2022-06-11 ENCOUNTER — Other Ambulatory Visit: Payer: Self-pay | Admitting: Internal Medicine

## 2022-06-11 DIAGNOSIS — G903 Multi-system degeneration of the autonomic nervous system: Secondary | ICD-10-CM

## 2022-06-11 MED ORDER — DRONABINOL 5 MG PO CAPS: 5.0000 mg | ORAL_CAPSULE | Freq: Two times a day (BID) | ORAL | 0 refills | Status: AC

## 2022-06-11 MED ORDER — DROXIDOPA 100 MG PO CAPS: 100.0000 mg | ORAL_CAPSULE | Freq: Three times a day (TID) | ORAL | 0 refills | Status: AC

## 2022-06-11 NOTE — Addendum Note (Signed)
Addended by: Hinda Kehr on: 06/11/2022 10:53 AM   Modules accepted: Orders

## 2022-06-11 NOTE — Telephone Encounter (Signed)
..  Caller & Relationship to patient: Lattie Haw - [facility]   Call back number:  [phone]   Date of last office visit:   Date of next office visit:   Medication(s) to be refilled:  Droxadopa 100 mg.        Preferred Pharmacy: [facility]

## 2022-06-15 ENCOUNTER — Inpatient Hospital Stay: Payer: Medicare Other

## 2022-06-26 ENCOUNTER — Encounter: Payer: Self-pay | Admitting: Internal Medicine

## 2022-06-26 ENCOUNTER — Encounter: Payer: Self-pay | Admitting: Hematology

## 2022-06-26 DIAGNOSIS — C7A019 Malignant carcinoid tumor of the small intestine, unspecified portion: Secondary | ICD-10-CM | POA: Diagnosis not present

## 2022-06-26 DIAGNOSIS — N1832 Chronic kidney disease, stage 3b: Secondary | ICD-10-CM | POA: Diagnosis not present

## 2022-06-29 DEATH — deceased

## 2022-07-01 ENCOUNTER — Ambulatory Visit: Payer: Medicare Other | Admitting: Internal Medicine

## 2022-07-02 ENCOUNTER — Encounter: Payer: Medicare Other | Admitting: Cardiology

## 2022-07-13 ENCOUNTER — Ambulatory Visit: Payer: Medicare Other

## 2022-07-13 ENCOUNTER — Other Ambulatory Visit: Payer: Medicare Other

## 2022-07-21 ENCOUNTER — Encounter: Payer: Medicare Other | Admitting: Cardiology

## 2022-08-07 ENCOUNTER — Other Ambulatory Visit: Payer: Medicare Other

## 2022-08-07 ENCOUNTER — Ambulatory Visit: Payer: Medicare Other

## 2022-08-07 ENCOUNTER — Ambulatory Visit: Payer: Medicare Other | Admitting: Hematology

## 2022-08-10 ENCOUNTER — Other Ambulatory Visit: Payer: Self-pay | Admitting: Cardiology

## 2022-08-10 ENCOUNTER — Other Ambulatory Visit: Payer: Medicare Other

## 2022-08-10 ENCOUNTER — Ambulatory Visit: Payer: Medicare Other

## 2022-08-11 ENCOUNTER — Ambulatory Visit: Payer: Medicare Other | Admitting: Cardiology

## 2022-09-04 ENCOUNTER — Other Ambulatory Visit: Payer: Medicare Other

## 2022-09-04 ENCOUNTER — Ambulatory Visit: Payer: Medicare Other

## 2022-10-02 ENCOUNTER — Ambulatory Visit: Payer: Medicare Other

## 2022-10-02 ENCOUNTER — Other Ambulatory Visit: Payer: Medicare Other

## 2022-10-30 ENCOUNTER — Other Ambulatory Visit: Payer: Medicare Other

## 2022-10-30 ENCOUNTER — Ambulatory Visit: Payer: Medicare Other

## 2022-12-04 ENCOUNTER — Ambulatory Visit: Payer: Medicare Other

## 2022-12-04 ENCOUNTER — Other Ambulatory Visit: Payer: Medicare Other

## 2023-05-09 IMAGING — CT CT ANGIO CHEST
3 of 7 series · 18 of 36 positions shown · IV contrast (omnipaque)
Comparison: 04/17/2010

CLINICAL DATA: Weakness and shortness of breath

EXAM:
CT ANGIOGRAPHY CHEST WITH CONTRAST
TECHNIQUE: Multidetector CT imaging of the chest was performed using the
standard protocol during bolus administration of intravenous
contrast. Multiplanar CT image reconstructions and MIPs were
obtained to evaluate the vascular anatomy.
CONTRAST:  64mL OMNIPAQUE IOHEXOL 350 MG/ML SOLN

[Series 5: thins · axial · 0.83mm/px · z∈[-196,+40]mm · 12 of 280 slices shown]
[im 22/280  lung]
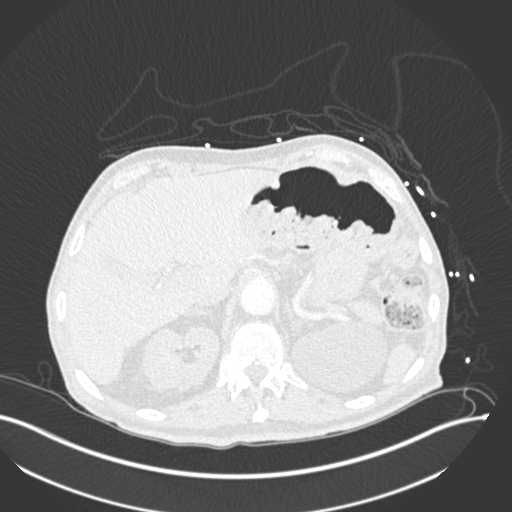
[im 43/280  mediastinal]
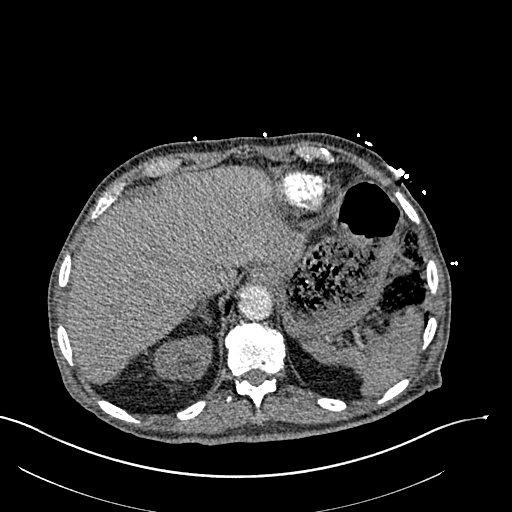
[im 65/280  lung]
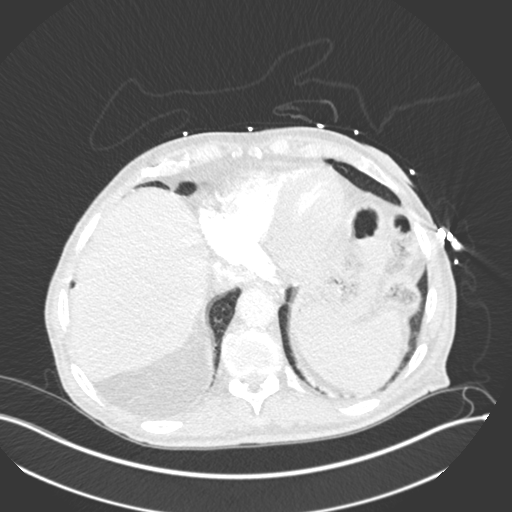
[im 86/280  mediastinal]
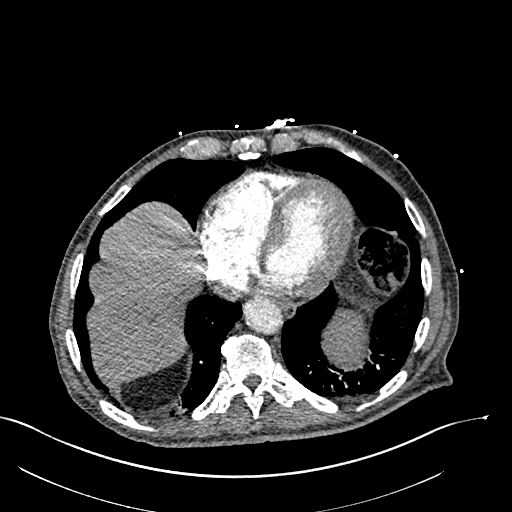
[im 108/280  lung]
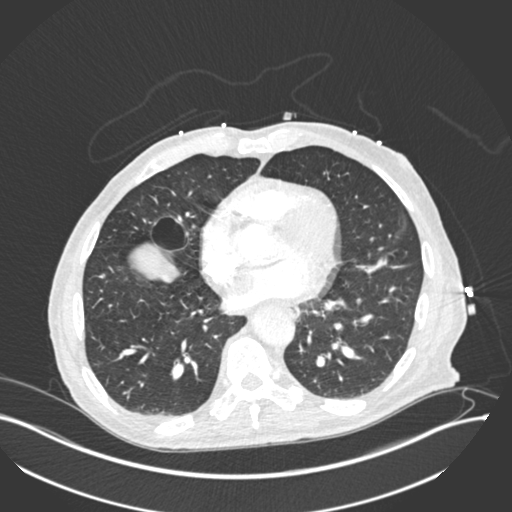
[im 129/280  mediastinal]
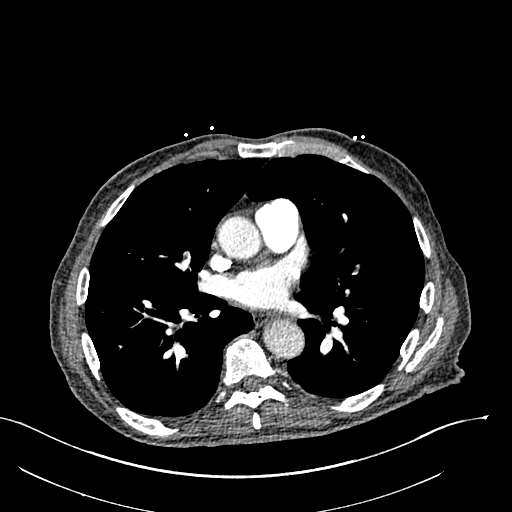
[im 151/280  lung]
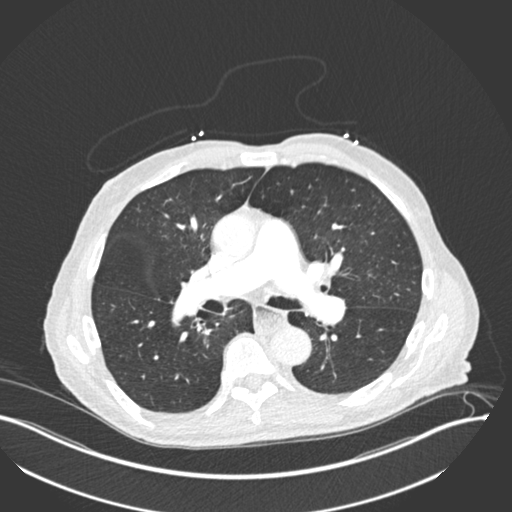
[im 172/280  mediastinal]
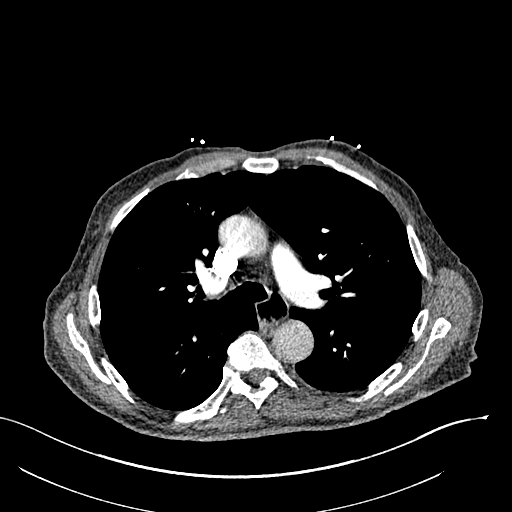
[im 194/280  lung]
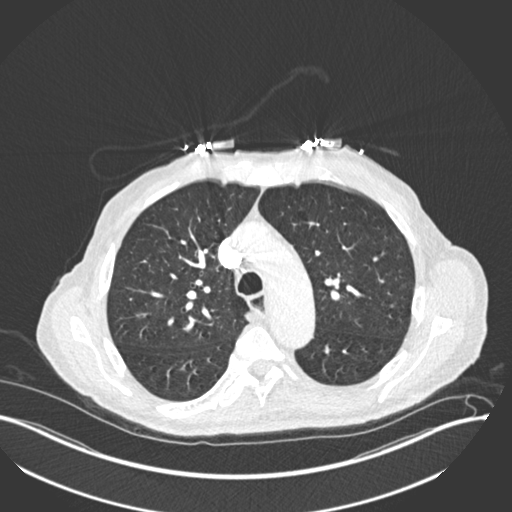
[im 215/280  mediastinal]
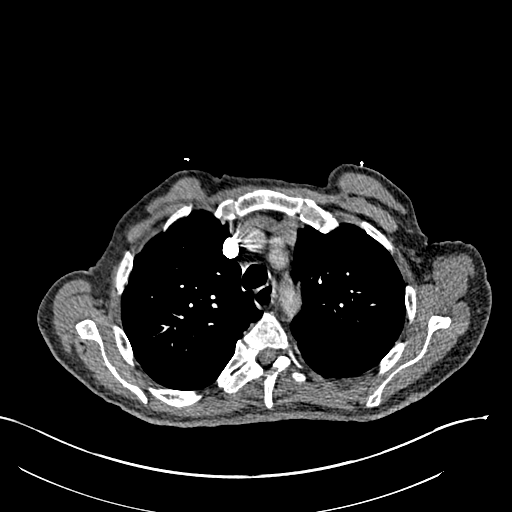
[im 237/280  lung]
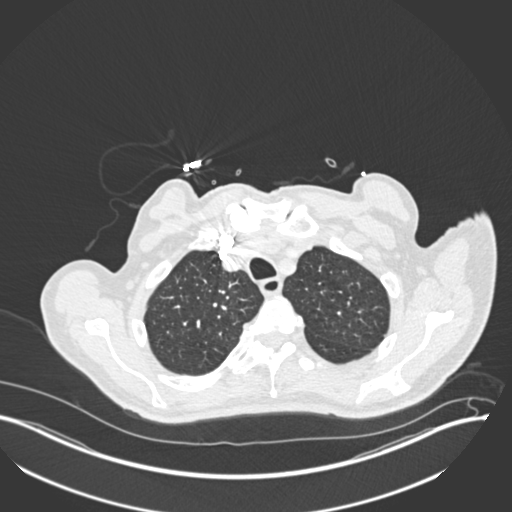
[im 258/280  mediastinal]
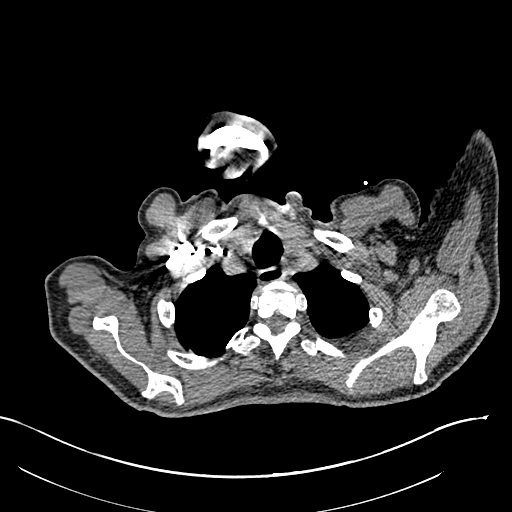

[Series 6: coronal mpr · coronal · 0.66mm/px · 1 of 129 slices shown]
[im 65/129  mediastinal]
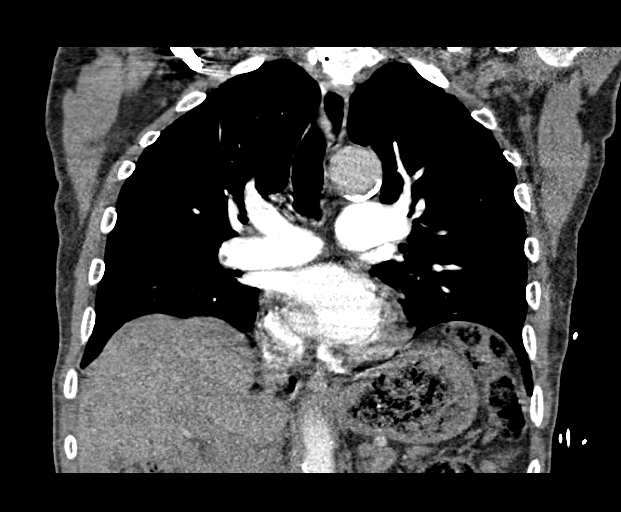

[Series 10: lung · axial · 0.83mm/px · z∈[-164,+16]mm · 5 of 136 slices shown]
[im 23/136  mediastinal]
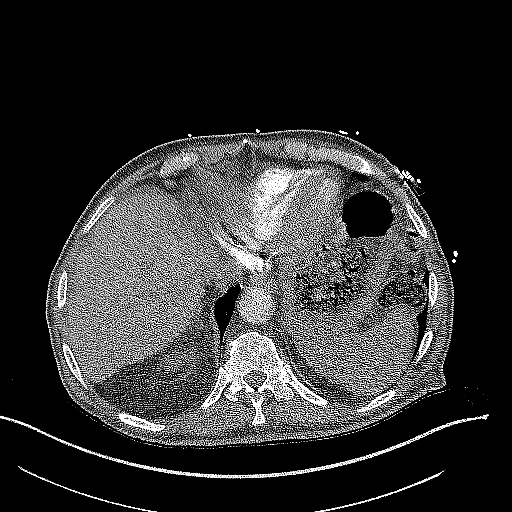
[im 46/136  mediastinal]
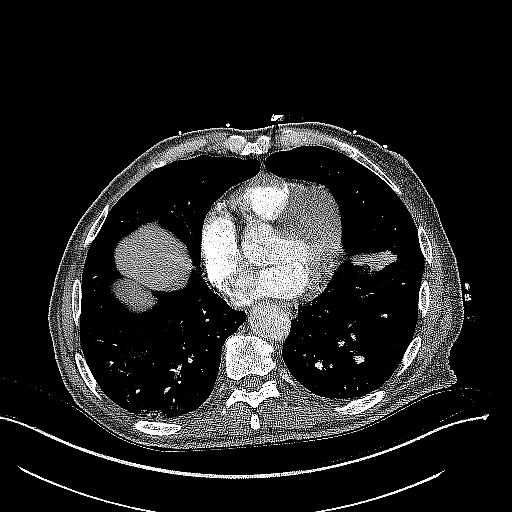
[im 68/136  mediastinal]
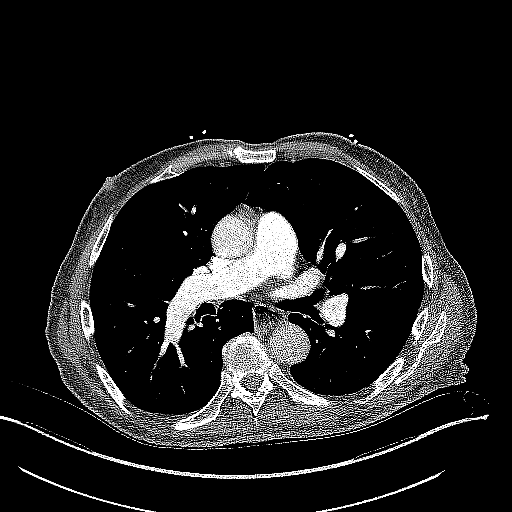
[im 91/136  mediastinal]
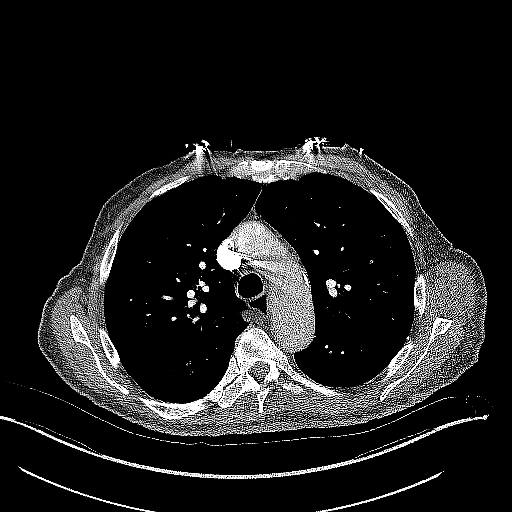
[im 113/136  mediastinal]
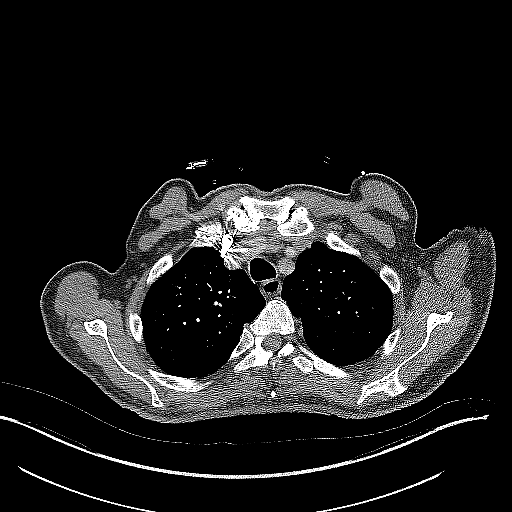

[18 of 36 positions shown; findings below may reference images not displayed]

FINDINGS: Cardiovascular: Atherosclerotic calcifications of the aorta are
noted. No significant aortic opacification is identified to suggest
dissection. No aneurysmal dilatation is noted. No cardiac
enlargement is seen. Scattered coronary calcifications are noted.
The pulmonary artery shows a normal branching pattern bilaterally.
Tiny right middle lobe pulmonary emboli are seen. No other
significant emboli are noted. No right heart strain is seen.

Mediastinum/Nodes: Thoracic inlet is within normal limits. No
sizable hilar or mediastinal adenopathy is noted. The esophagus
demonstrates some retained fluid and food stuffs. This may be
related to reflux.

Lungs/Pleura: Lungs are well aerated bilaterally. Minimal dependent
atelectatic changes are seen. No focal infiltrate or effusion is
noted. No evidence of metastatic disease is seen.

Upper Abdomen: Visualized upper abdomen shows renal cystic change on
the left as well as cysts within the left lobe of the liver. No
other focal abnormality in the upper abdomen is seen.

Musculoskeletal: Degenerative changes of the thoracic spine are

Review of the MIP images confirms the above findings.
IMPRESSION: Small pulmonary emboli involving the right middle lobe as described.

Renal cystic change and hepatic cystic change.

Aortic Atherosclerosis ([id number]).

## 2023-05-09 IMAGING — CT CT HEAD W/O CM
3 series · 14 of 47 positions shown, 16 images · non-contrast
Comparison: None.

CLINICAL DATA: Minor head trauma.  History of metastatic disease.

EXAM:
CT HEAD WITHOUT CONTRAST
TECHNIQUE: Contiguous axial images were obtained from the base of the skull
through the vertex without intravenous contrast.

[Series 2: head wo · axial · 0.47mm/px · z∈[-44,+81]mm · 8 of 30 slices shown, 10 images]
[im 3/30  brain]
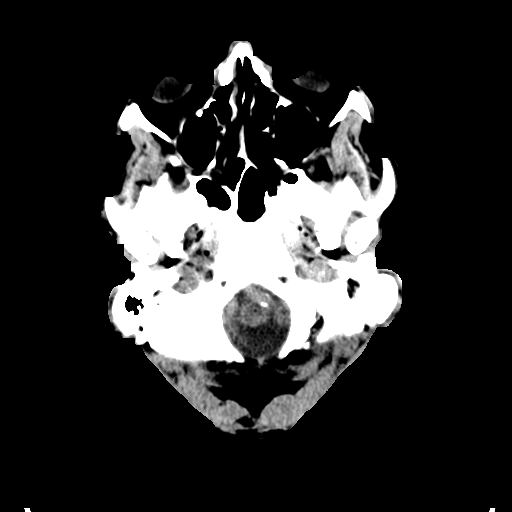
[im 3/30  bone]
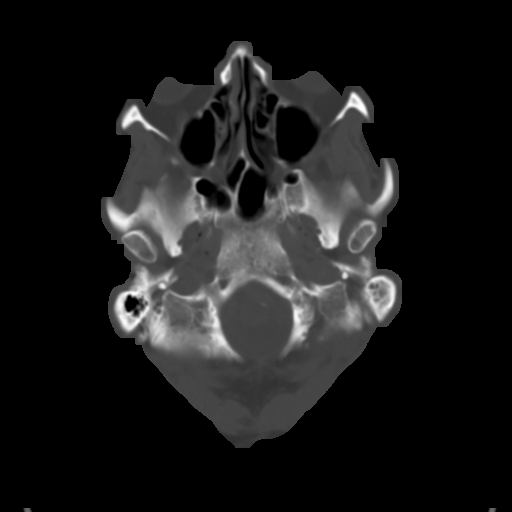
[im 7/30  brain]
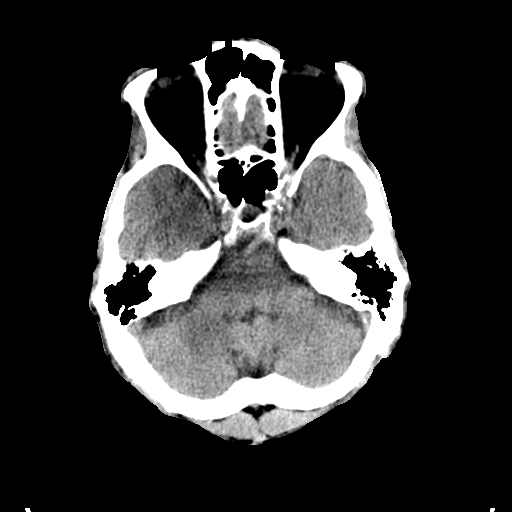
[im 10/30  brain]
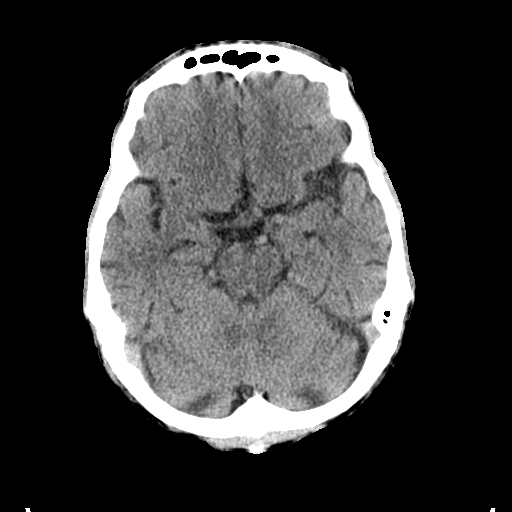
[im 14/30  brain]
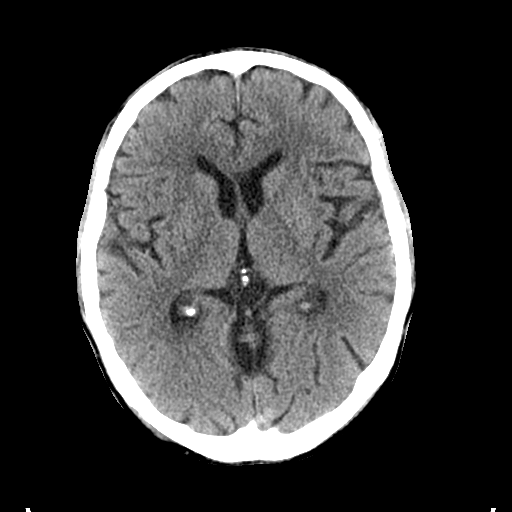
[im 17/30  brain]
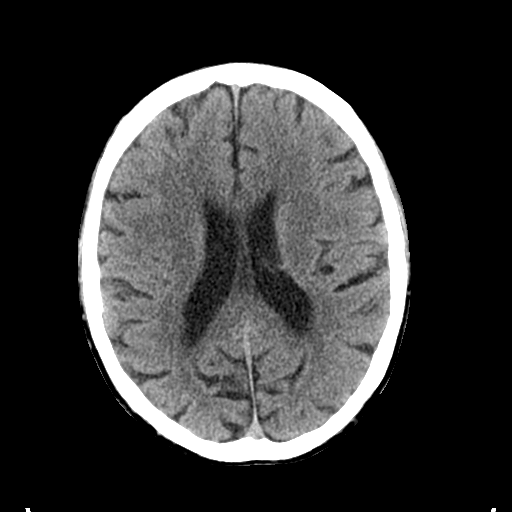
[im 17/30  bone]
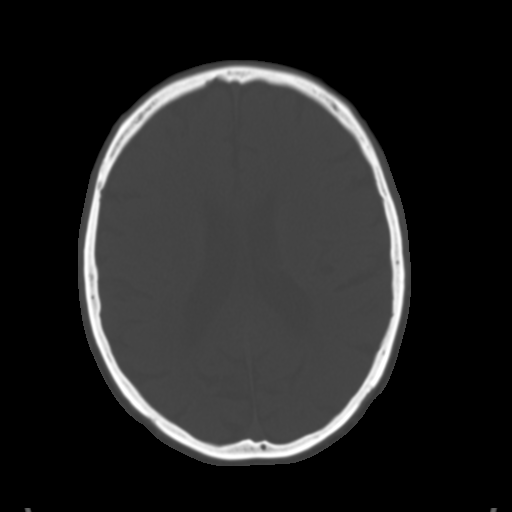
[im 21/30  brain]
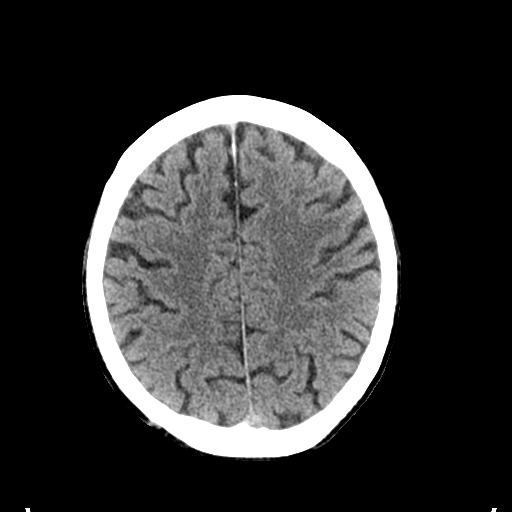
[im 24/30  brain]
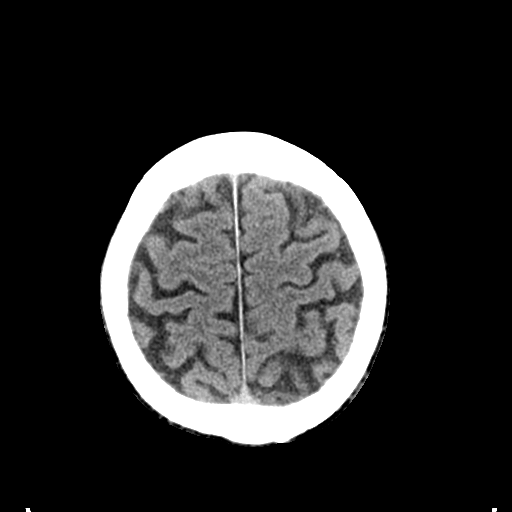
[im 28/30  brain]
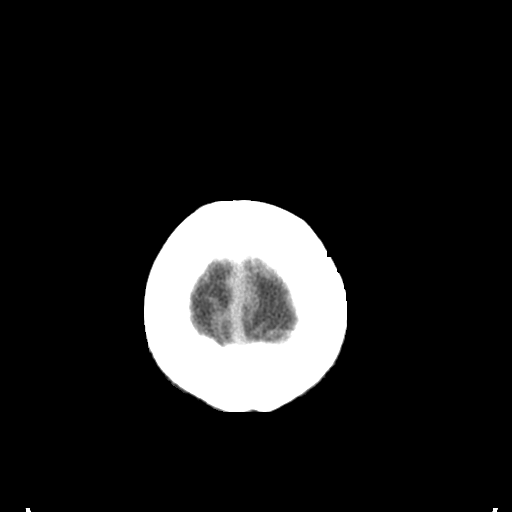

[Series 4: coronal soft tissue · coronal · 0.34mm/px · 3 of 66 slices shown]
[im 22/66  brain]
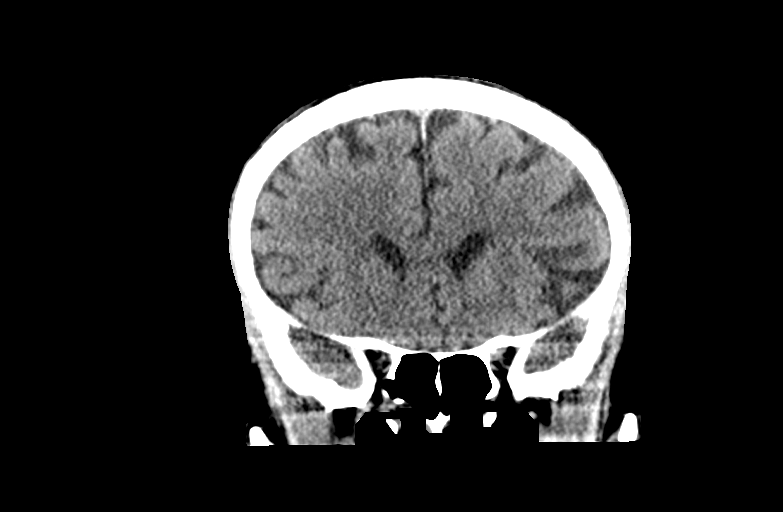
[im 29/66  brain]
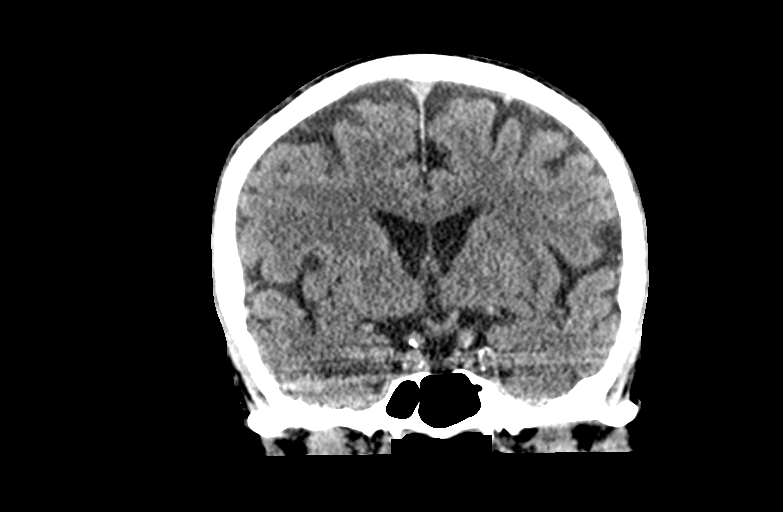
[im 37/66  brain]
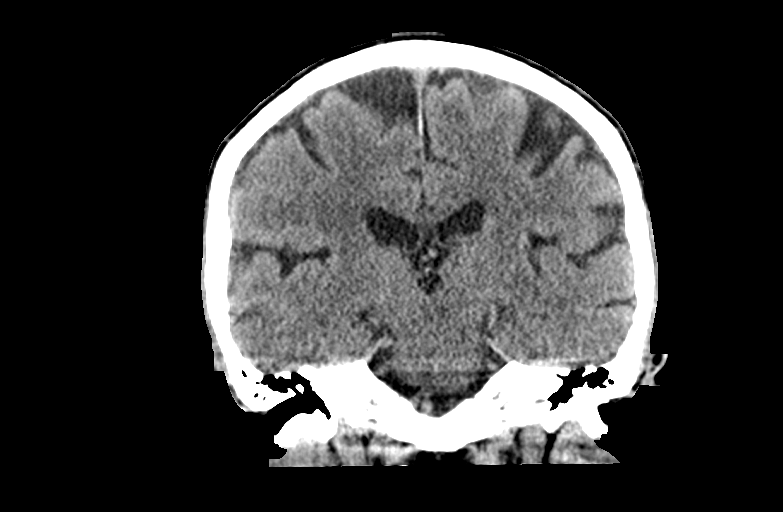

[Series 5: sagittal soft tissue · sagittal · 0.36mm/px · 3 of 53 slices shown]
[im 18/53  brain]
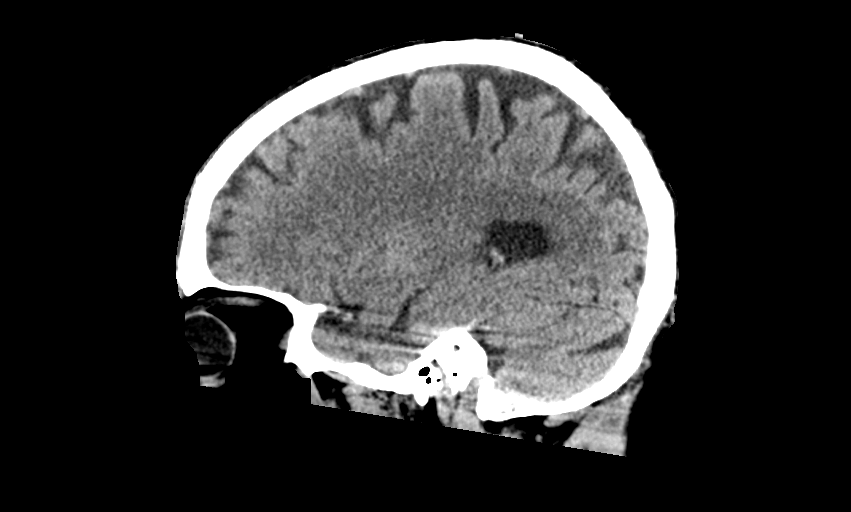
[im 27/53  brain]
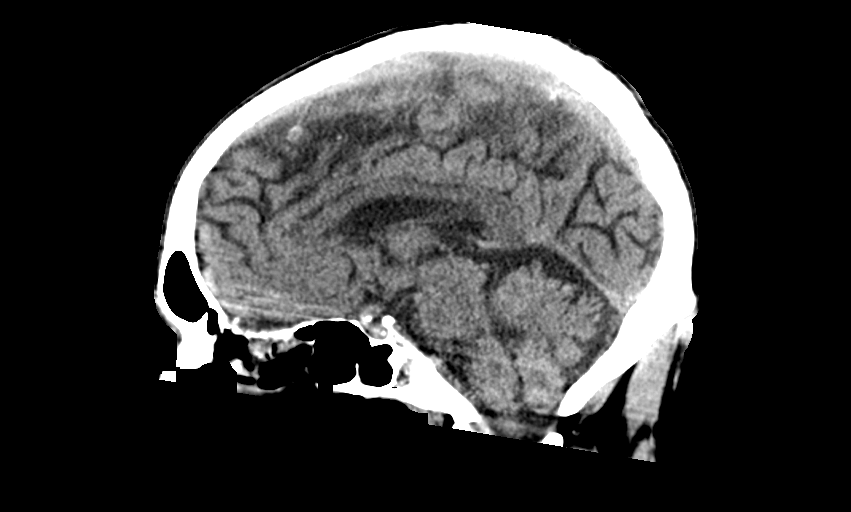
[im 35/53  brain]
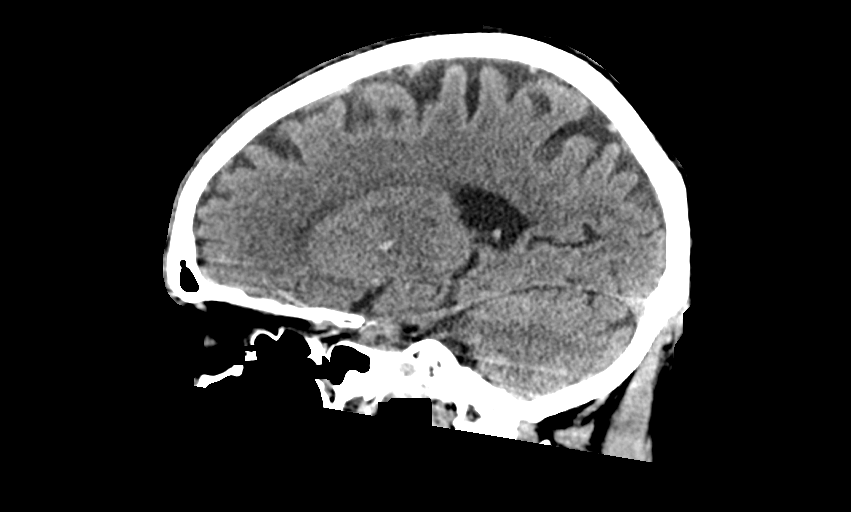

[14 of 47 positions shown; findings below may reference images not displayed]

FINDINGS: Brain: No evidence of acute infarction, hemorrhage, hydrocephalus,
extra-axial collection or mass lesion/mass effect.

Vascular: No hyperdense vessel or unexpected calcification.

Skull: Normal. Negative for fracture or focal lesion.

Sinuses/Orbits: No acute finding.
IMPRESSION: Negative head CT.

## 2023-05-09 IMAGING — DX DG KNEE 1-2V*R*
2 series · 2 of 2 positions shown · non-contrast
Comparison: None.

CLINICAL DATA: Weakness, fell, metastatic liver cancer

EXAM:
RIGHT KNEE - 1-2 VIEW

[knee ap]
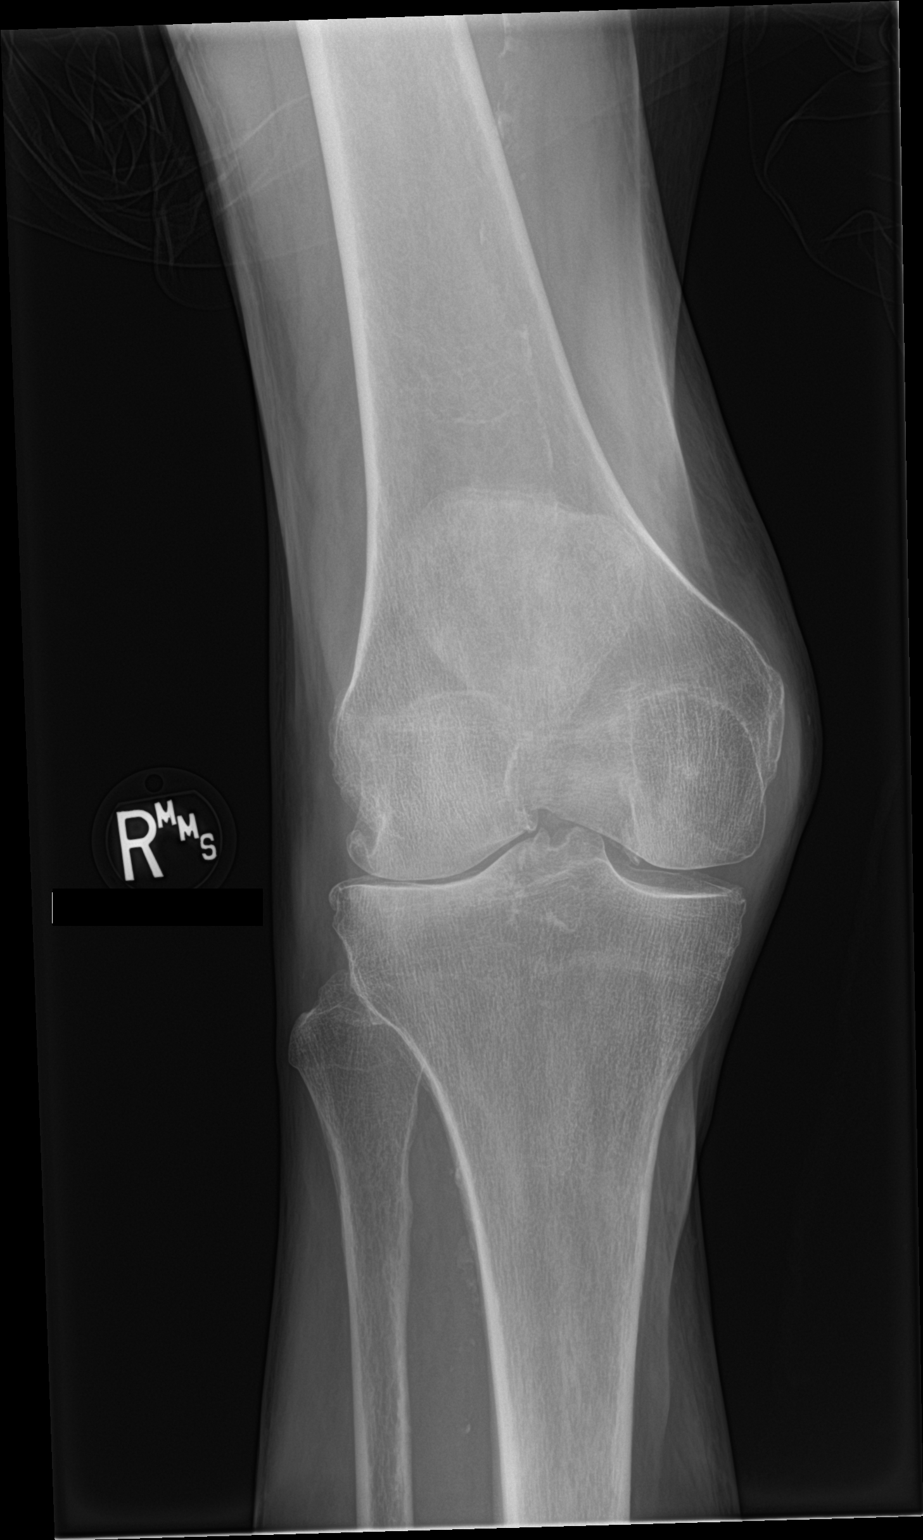

[knee lat]
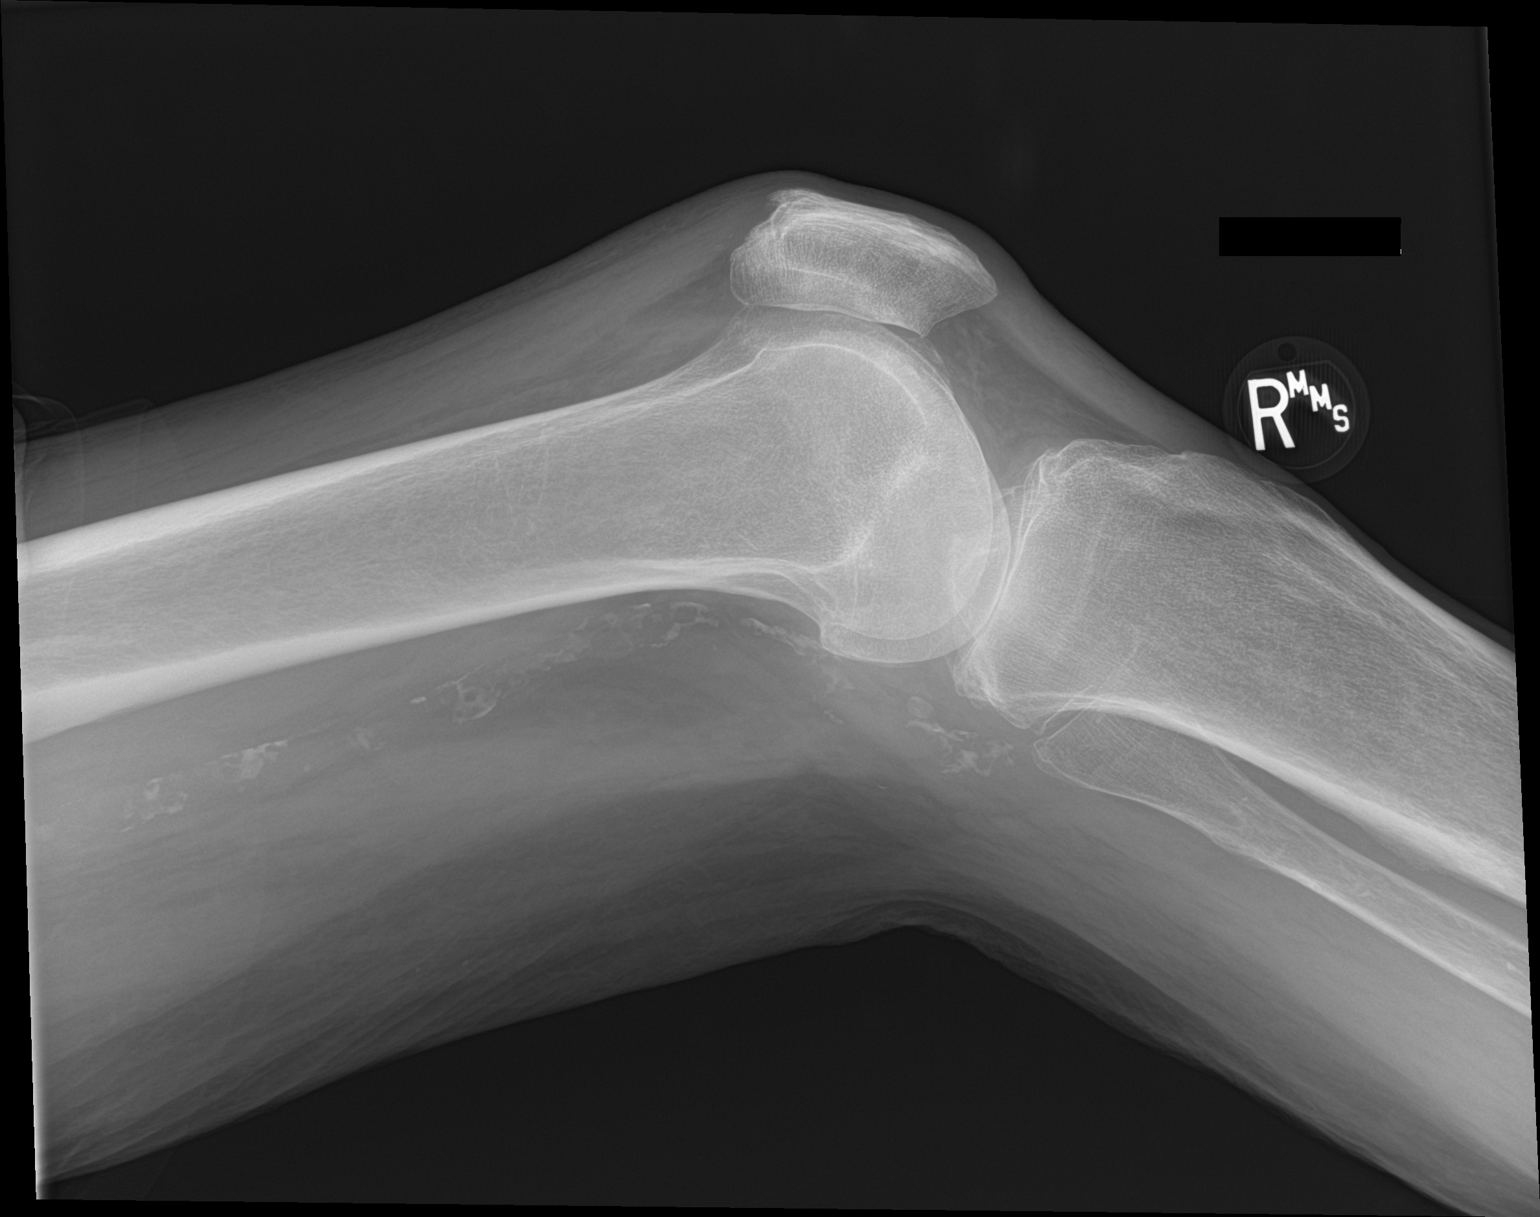

[2 of 2 positions shown; findings below may reference images not displayed]

FINDINGS: Frontal and lateral views of the right knee are obtained. No
fracture, subluxation, or dislocation. Moderate 3 compartmental
osteoarthritis greatest in the lateral compartment. No joint
effusion. Diffuse vascular calcifications.
IMPRESSION: 1. Osteoarthritis.  No acute fracture.
